# Patient Record
Sex: Male | Born: 1945
Health system: Southern US, Community
[De-identification: ages and names within clinical notes are randomized; demographics above are authoritative.]

## PROBLEM LIST (undated history)

## (undated) DIAGNOSIS — K219 Gastro-esophageal reflux disease without esophagitis: Secondary | ICD-10-CM

## (undated) DIAGNOSIS — I10 Essential (primary) hypertension: Secondary | ICD-10-CM

## (undated) DIAGNOSIS — I2510 Atherosclerotic heart disease of native coronary artery without angina pectoris: Secondary | ICD-10-CM

## (undated) DIAGNOSIS — N028 Recurrent and persistent hematuria with other morphologic changes: Secondary | ICD-10-CM

## (undated) DIAGNOSIS — I219 Acute myocardial infarction, unspecified: Secondary | ICD-10-CM

## (undated) DIAGNOSIS — Z9289 Personal history of other medical treatment: Secondary | ICD-10-CM

## (undated) DIAGNOSIS — R7303 Prediabetes: Secondary | ICD-10-CM

## (undated) DIAGNOSIS — I509 Heart failure, unspecified: Secondary | ICD-10-CM

## (undated) DIAGNOSIS — N02B9 Other recurrent and persistent immunoglobulin A nephropathy: Secondary | ICD-10-CM

## (undated) DIAGNOSIS — Z5189 Encounter for other specified aftercare: Secondary | ICD-10-CM

## (undated) DIAGNOSIS — K5792 Diverticulitis of intestine, part unspecified, without perforation or abscess without bleeding: Secondary | ICD-10-CM

## (undated) DIAGNOSIS — M1990 Unspecified osteoarthritis, unspecified site: Secondary | ICD-10-CM

## (undated) DIAGNOSIS — R011 Cardiac murmur, unspecified: Secondary | ICD-10-CM

## (undated) DIAGNOSIS — Z8249 Family history of ischemic heart disease and other diseases of the circulatory system: Secondary | ICD-10-CM

## (undated) DIAGNOSIS — D649 Anemia, unspecified: Secondary | ICD-10-CM

## (undated) DIAGNOSIS — I499 Cardiac arrhythmia, unspecified: Secondary | ICD-10-CM

## (undated) DIAGNOSIS — H269 Unspecified cataract: Secondary | ICD-10-CM

## (undated) DIAGNOSIS — E785 Hyperlipidemia, unspecified: Secondary | ICD-10-CM

## (undated) DIAGNOSIS — Z87442 Personal history of urinary calculi: Secondary | ICD-10-CM

## (undated) DIAGNOSIS — IMO0002 Reserved for concepts with insufficient information to code with codable children: Secondary | ICD-10-CM

## (undated) HISTORY — DX: Encounter for other specified aftercare: Z51.89

## (undated) HISTORY — DX: Unspecified cataract: H26.9

## (undated) HISTORY — DX: Personal history of other medical treatment: Z92.89

## (undated) HISTORY — PX: COLONOSCOPY: SHX174

## (undated) HISTORY — PX: LUNG LOBECTOMY: SHX167

## (undated) HISTORY — DX: Family history of ischemic heart disease and other diseases of the circulatory system: Z82.49

## (undated) HISTORY — DX: Other recurrent and persistent immunoglobulin A nephropathy: N02.B9

## (undated) HISTORY — PX: COLON SURGERY: SHX602

## (undated) HISTORY — DX: Essential (primary) hypertension: I10

## (undated) HISTORY — DX: Atherosclerotic heart disease of native coronary artery without angina pectoris: I25.10

## (undated) HISTORY — DX: Recurrent and persistent hematuria with other morphologic changes: N02.8

## (undated) HISTORY — DX: Diverticulitis of intestine, part unspecified, without perforation or abscess without bleeding: K57.92

## (undated) HISTORY — DX: Hyperlipidemia, unspecified: E78.5

---

## 1995-08-22 HISTORY — PX: CARDIAC CATHETERIZATION: SHX172

## 1999-02-28 ENCOUNTER — Emergency Department (HOSPITAL_COMMUNITY): Admission: EM | Admit: 1999-02-28 | Discharge: 1999-02-28 | Payer: Self-pay | Admitting: Emergency Medicine

## 1999-02-28 ENCOUNTER — Encounter: Payer: Self-pay | Admitting: Emergency Medicine

## 2000-01-22 HISTORY — PX: CORONARY ARTERY BYPASS GRAFT: SHX141

## 2000-02-03 ENCOUNTER — Encounter: Payer: Self-pay | Admitting: Cardiovascular Disease

## 2000-02-03 ENCOUNTER — Inpatient Hospital Stay (HOSPITAL_COMMUNITY): Admission: EM | Admit: 2000-02-03 | Discharge: 2000-02-10 | Payer: Self-pay | Admitting: Emergency Medicine

## 2000-02-05 ENCOUNTER — Encounter: Payer: Self-pay | Admitting: Cardiothoracic Surgery

## 2000-02-06 ENCOUNTER — Encounter: Payer: Self-pay | Admitting: Cardiothoracic Surgery

## 2000-02-07 ENCOUNTER — Encounter: Payer: Self-pay | Admitting: Cardiothoracic Surgery

## 2000-02-08 ENCOUNTER — Encounter: Payer: Self-pay | Admitting: Cardiothoracic Surgery

## 2000-02-09 ENCOUNTER — Encounter: Payer: Self-pay | Admitting: Cardiothoracic Surgery

## 2001-02-02 ENCOUNTER — Inpatient Hospital Stay (HOSPITAL_COMMUNITY): Admission: AC | Admit: 2001-02-02 | Discharge: 2001-02-04 | Payer: Self-pay

## 2001-02-02 ENCOUNTER — Encounter: Payer: Self-pay | Admitting: Emergency Medicine

## 2001-02-03 ENCOUNTER — Encounter: Payer: Self-pay | Admitting: General Surgery

## 2003-09-14 ENCOUNTER — Emergency Department (HOSPITAL_COMMUNITY): Admission: EM | Admit: 2003-09-14 | Discharge: 2003-09-14 | Payer: Self-pay | Admitting: Emergency Medicine

## 2004-06-10 ENCOUNTER — Emergency Department (HOSPITAL_COMMUNITY): Admission: EM | Admit: 2004-06-10 | Discharge: 2004-06-11 | Payer: Self-pay | Admitting: Emergency Medicine

## 2006-05-02 ENCOUNTER — Emergency Department (HOSPITAL_COMMUNITY): Admission: EM | Admit: 2006-05-02 | Discharge: 2006-05-02 | Payer: Self-pay | Admitting: Emergency Medicine

## 2006-05-02 IMAGING — CT CT ABDOMEN W/O CM
2 of 3 series · 17 of 46 positions shown, 19 images · IV contrast (agent unspecified)
Comparison: none

CLINICAL DATA: Left flank pain.  History of kidney stones.  
ABDOMEN CT WITHOUT CONTRAST:
TECHNIQUE: Multidetector CT imaging of the abdomen was performed following the standard protocol without IV contrast.
TECHNIQUE: Multidetector CT imaging of the pelvis was performed following the standard protocol without IV contrast.

[Series 3: stone proto 5.0 b31f · axial · 0.77mm/px · z∈[-440,-35]mm · 14 of 94 slices shown, 16 images]
[im 7/94  soft-tissue]
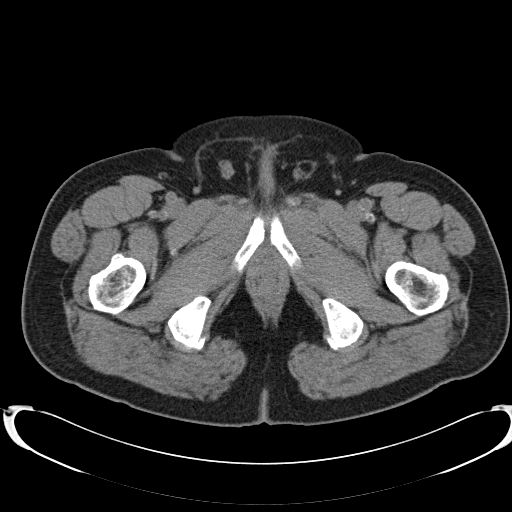
[im 7/94  bone]
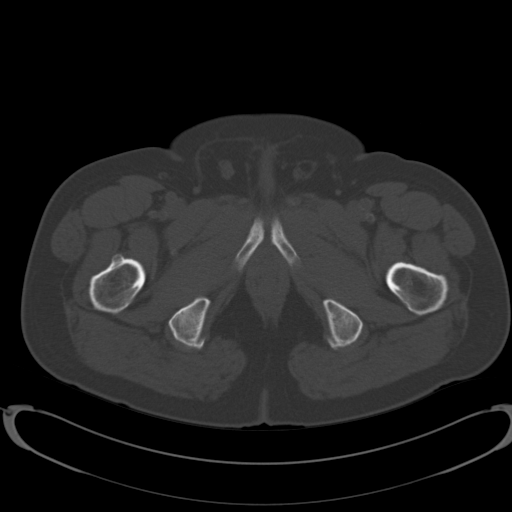
[im 13/94  soft-tissue]
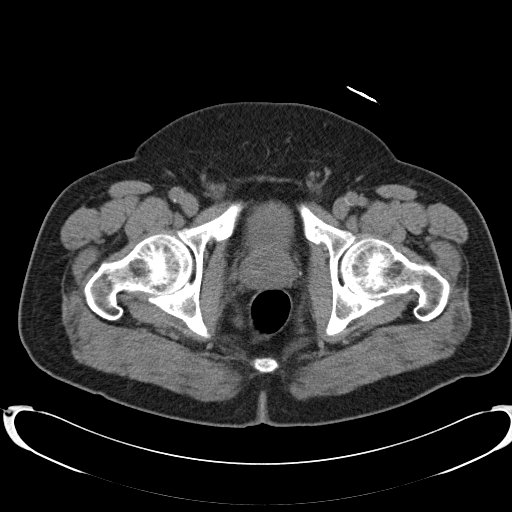
[im 19/94  soft-tissue]
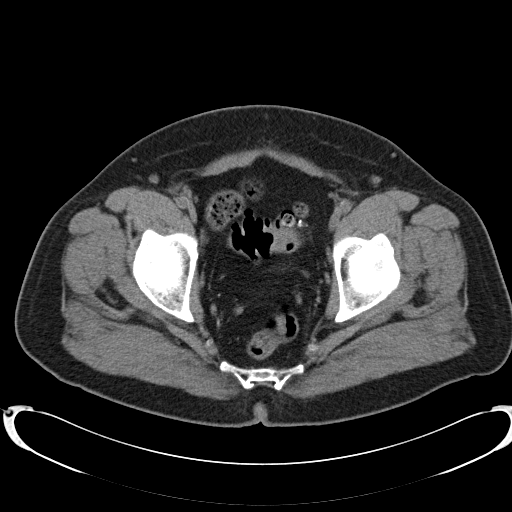
[im 25/94  soft-tissue]
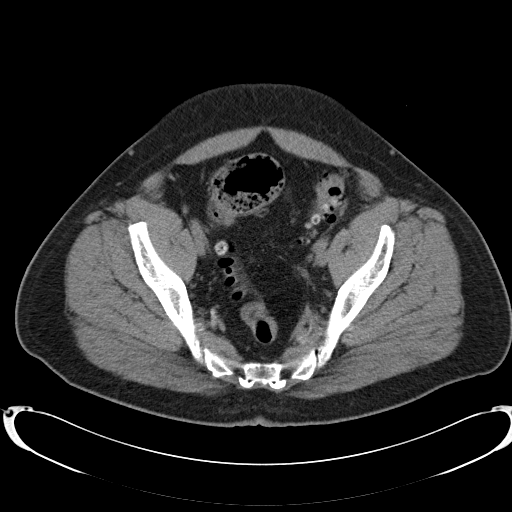
[im 31/94  soft-tissue]
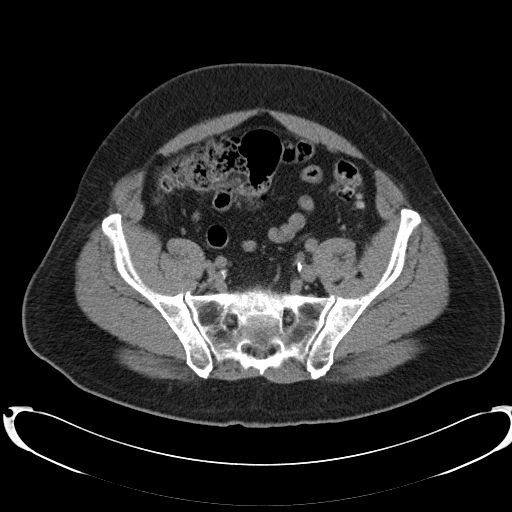
[im 37/94  soft-tissue]
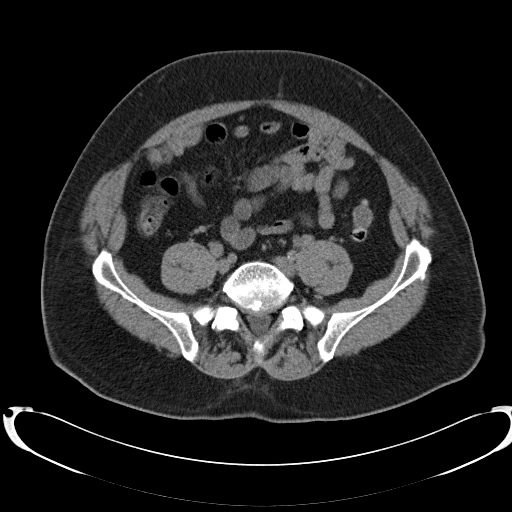
[im 43/94  soft-tissue]
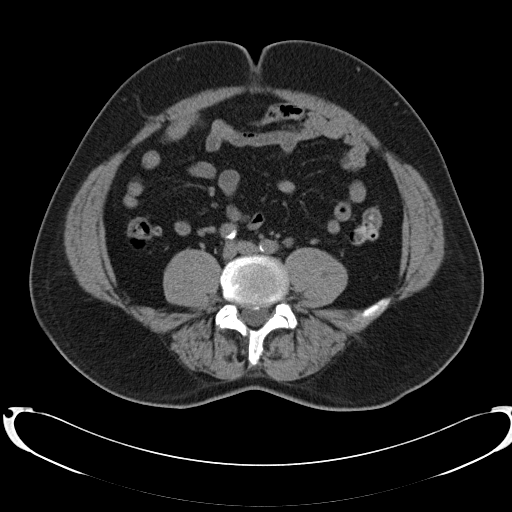
[im 52/94  soft-tissue]
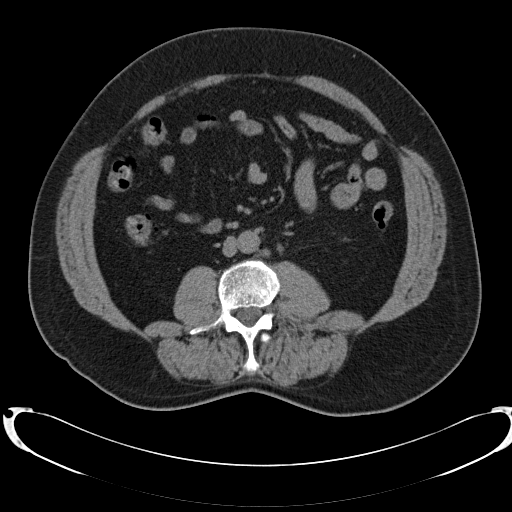
[im 58/94  soft-tissue]
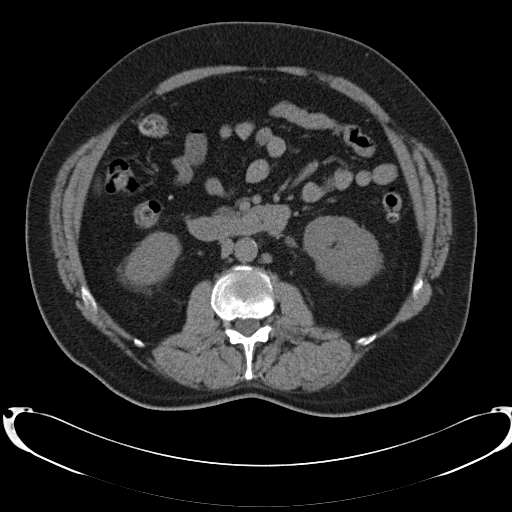
[im 58/94  bone]
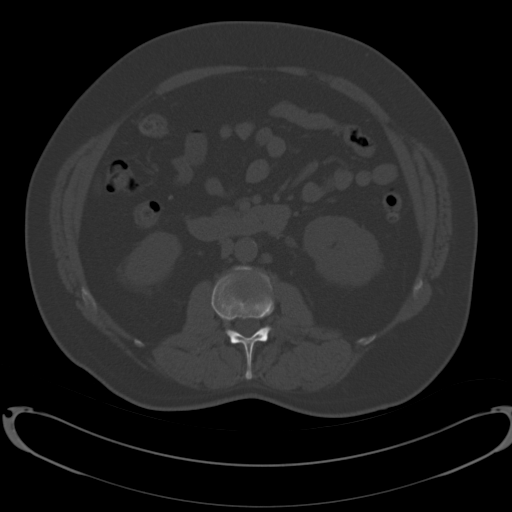
[im 64/94  soft-tissue]
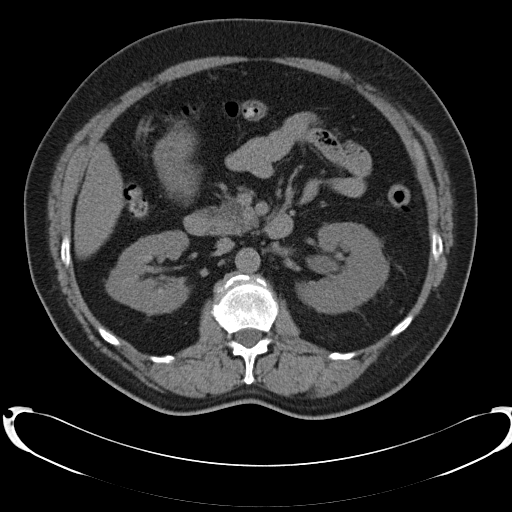
[im 70/94  soft-tissue]
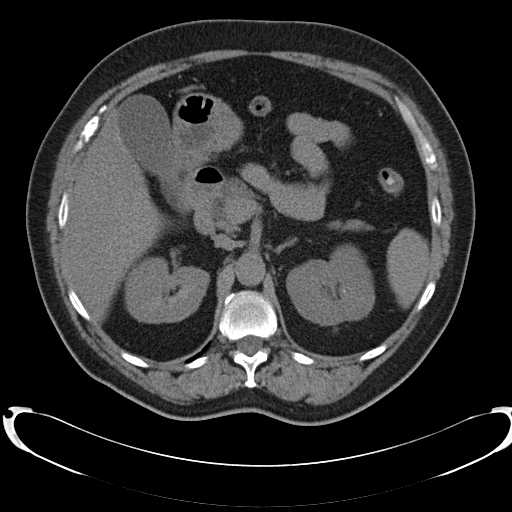
[im 76/94  soft-tissue]
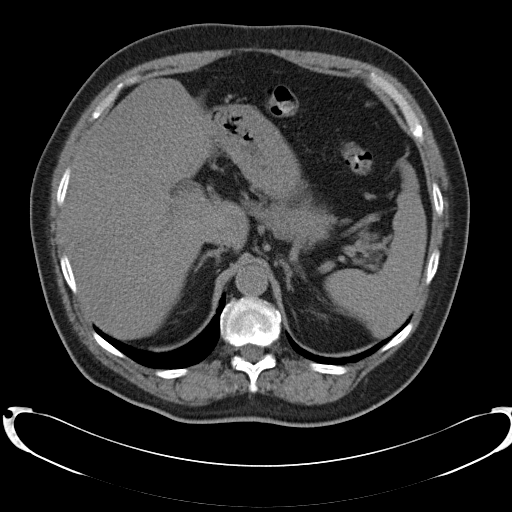
[im 82/94  soft-tissue]
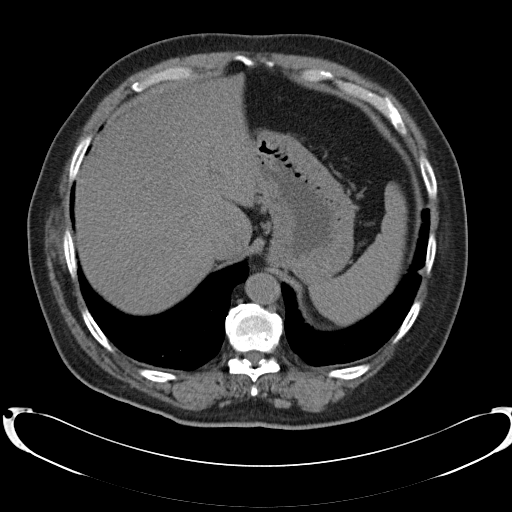
[im 88/94  soft-tissue]
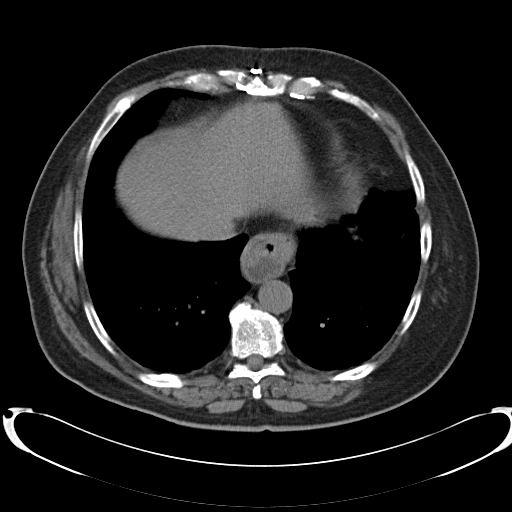

[Series 603: cor · coronal · 0.92mm/px · 3 of 141 slices shown]
[im 47/141  soft-tissue]
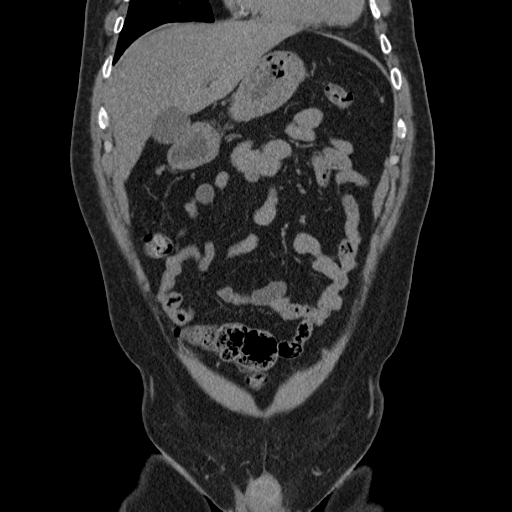
[im 63/141  soft-tissue]
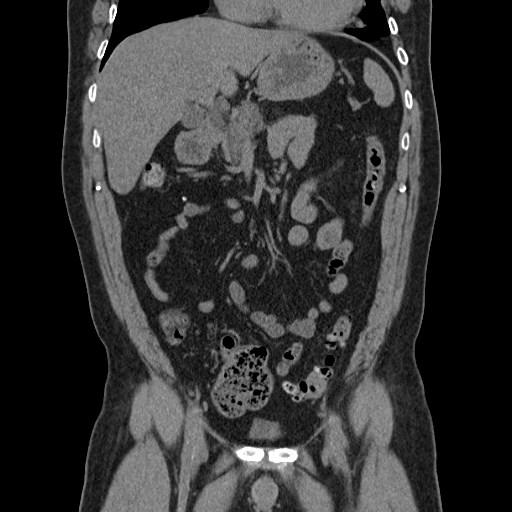
[im 78/141  soft-tissue]
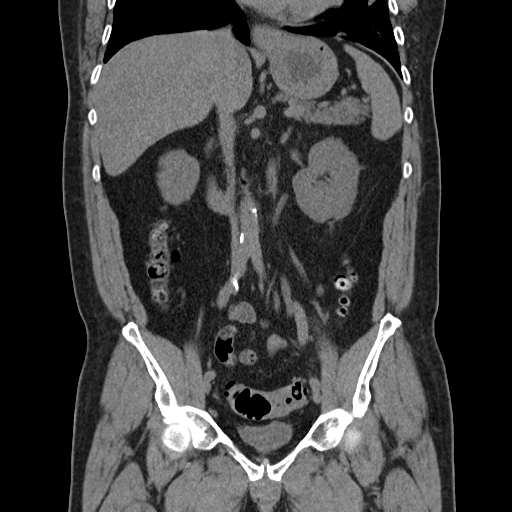

[17 of 46 positions shown; findings below may reference images not displayed]

FINDINGS: 2-3 mm calculus left upper pole collecting system.  There are 2 minute calcifications in the right kidney.  Mild dilatation of the left renal collecting system and ureter.  Minimal perirenal soft tissue stranding.  Hiatal hernia.
IMPRESSION: 1.  Findings compatible with acute obstructive uropathy on the left.  CT pelvis to follow.
2.  Bilateral renal calculi.
PELVIS CT WITHOUT CONTRAST:
FINDINGS: Obstructing calculus in the distal left ureter (see images 72 and 73).  Calculus measures approximately 4.5 x 6.0 mm.  It is located at the level of the innominate bones.  Incidentally there are extensive diverticula involving the left descending and sigmoid colon.
IMPRESSION: Distal left ureteral stone.

## 2010-03-04 ENCOUNTER — Encounter: Admission: RE | Admit: 2010-03-04 | Discharge: 2010-03-04 | Payer: Self-pay | Admitting: Cardiovascular Disease

## 2010-03-04 IMAGING — CT CT CERVICAL SPINE W/O CM
3 of 4 series · 16 of 28 positions shown, 18 images · non-contrast
Comparison: Report from [DATE]

CLINICAL DATA: Left neck and shoulder pain.  Left arm numbness and
weakness.

CT CERVICAL SPINE WITHOUT CONTRAST
TECHNIQUE: Multidetector CT imaging of the cervical spine was
performed. Multiplanar CT image reconstructions were also
generated.

[Series 2: c spine bone · axial · 0.27mm/px · z∈[+77,+217]mm · 5 of 85 slices shown, 7 images]
[im 15/85  soft-tissue]
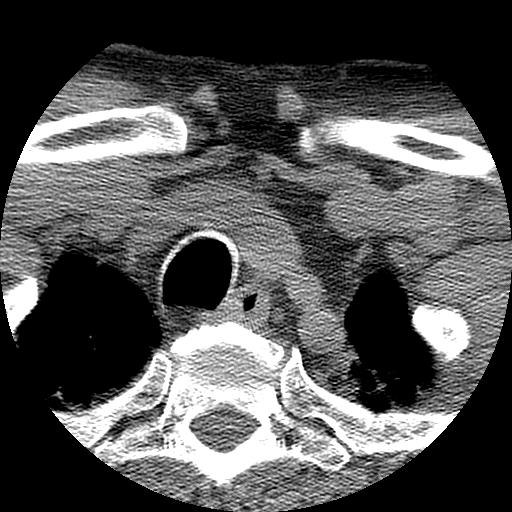
[im 15/85  bone]
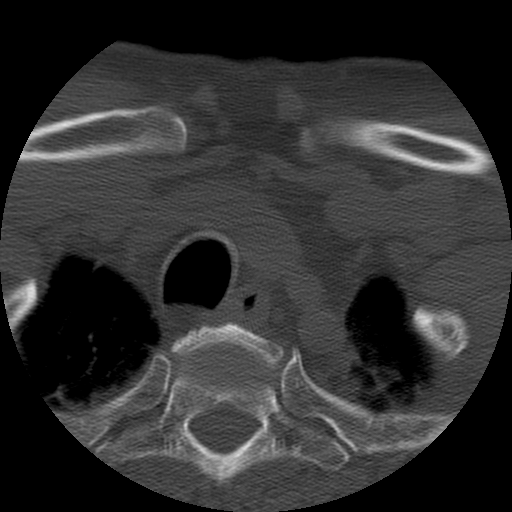
[im 29/85  bone]
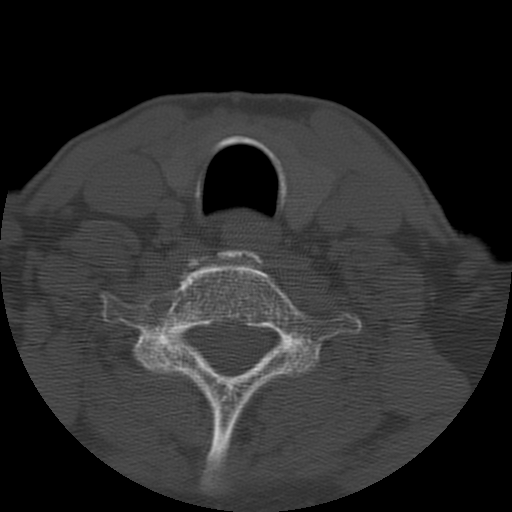
[im 43/85  bone]
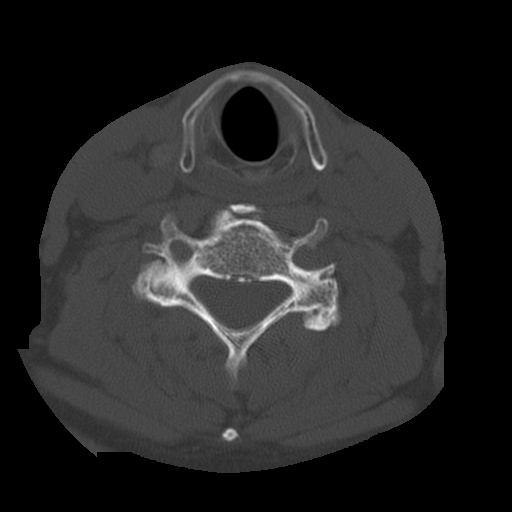
[im 57/85  bone]
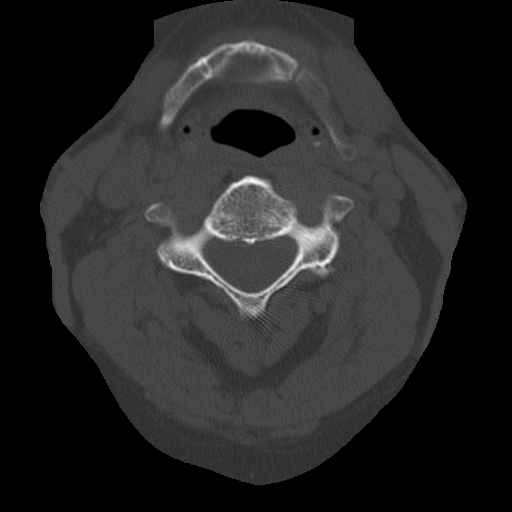
[im 71/85  soft-tissue]
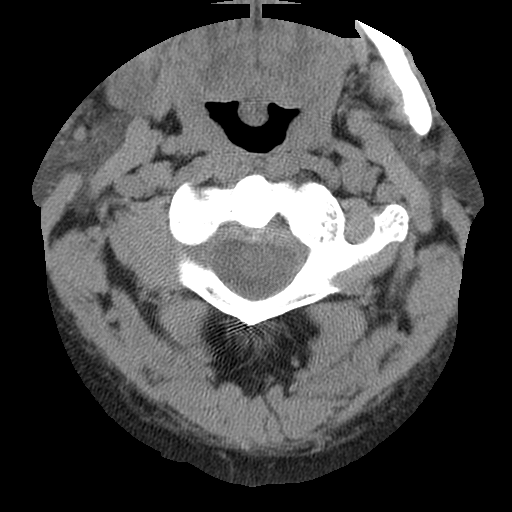
[im 71/85  bone]
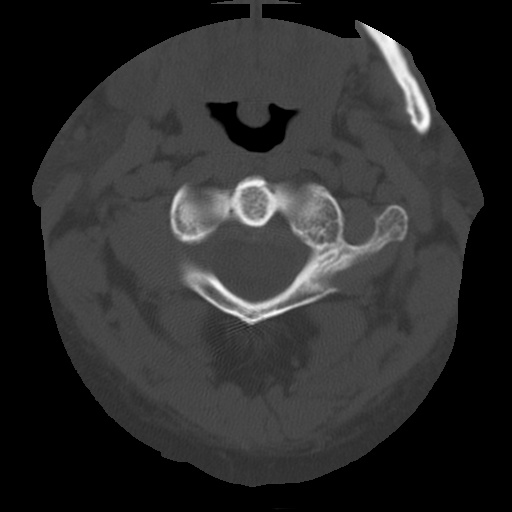

[Series 3: c spine soft · axial · 0.27mm/px · z∈[+77,+217]mm · 5 of 85 slices shown]
[im 15/85  soft-tissue]
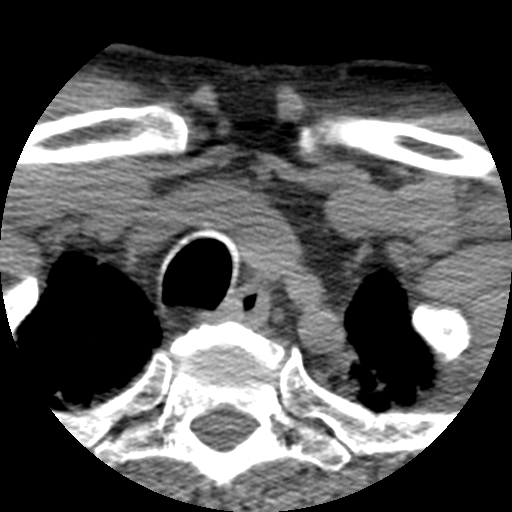
[im 29/85  soft-tissue]
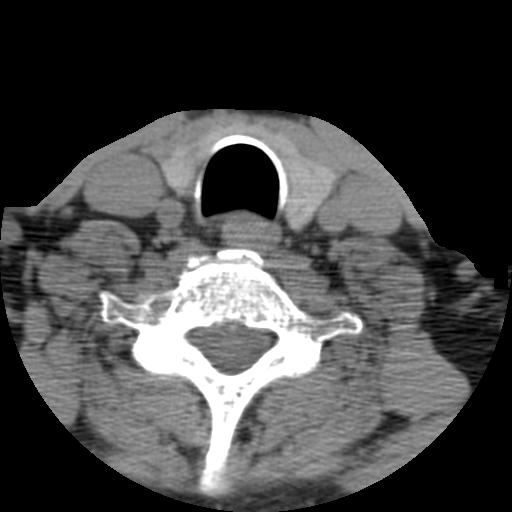
[im 43/85  soft-tissue]
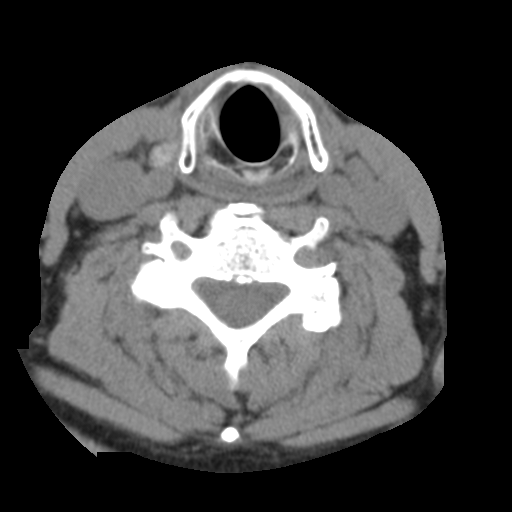
[im 57/85  soft-tissue]
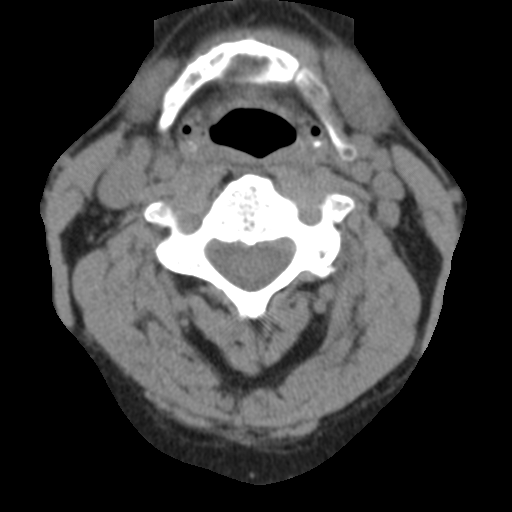
[im 71/85  soft-tissue]
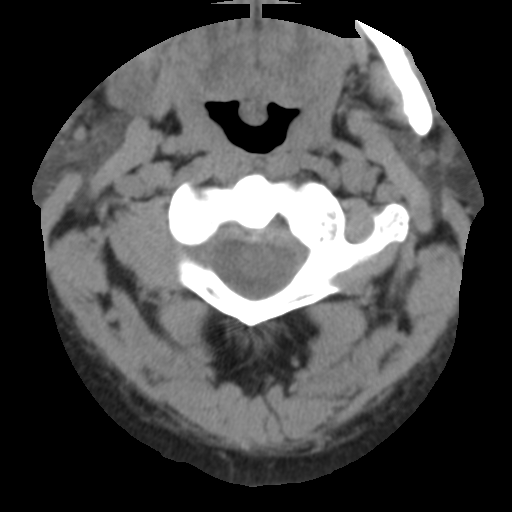

[Series 400: coronal · coronal · 0.42mm/px · 6 of 37 slices shown]
[im 2/37  soft-tissue]
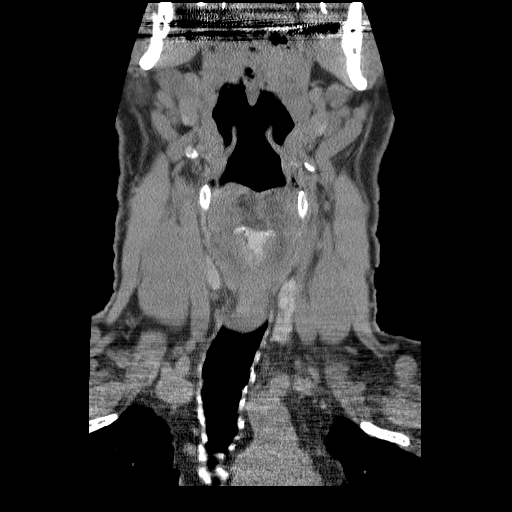
[im 7/37  bone]
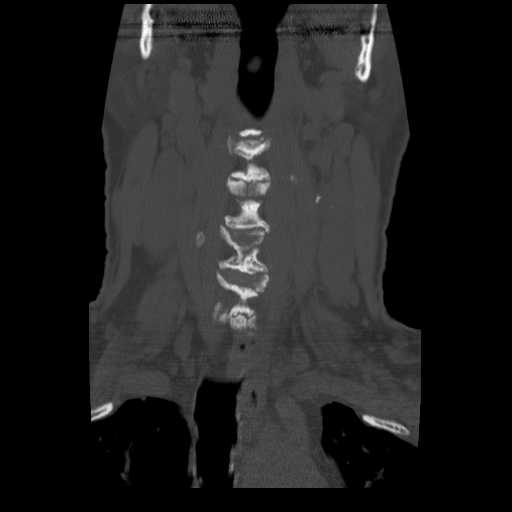
[im 13/37  bone]
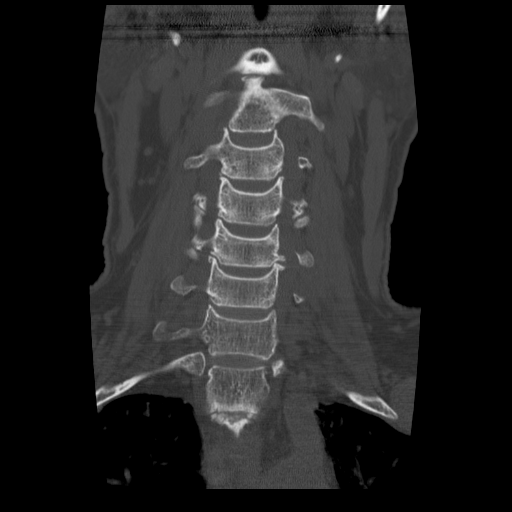
[im 19/37  bone]
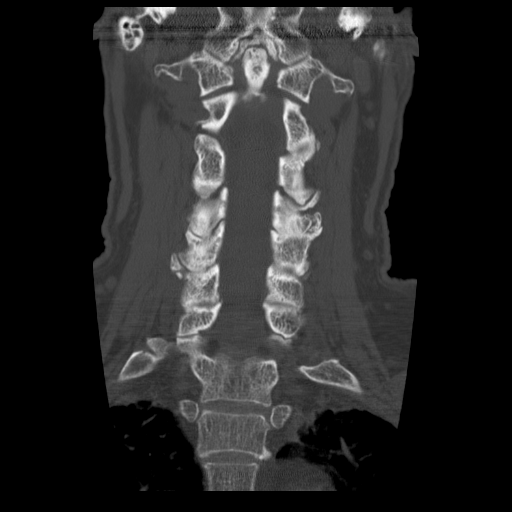
[im 25/37  bone]
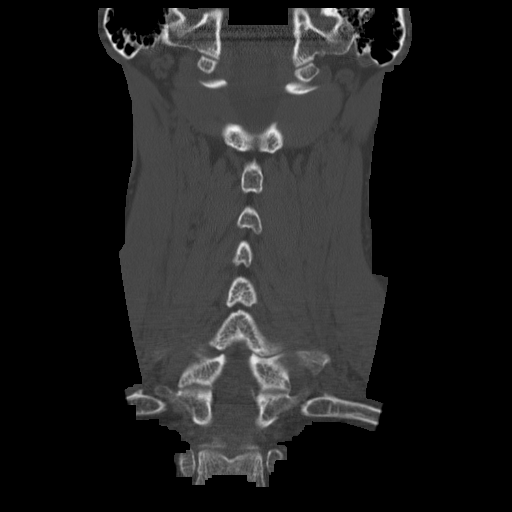
[im 31/37  bone]
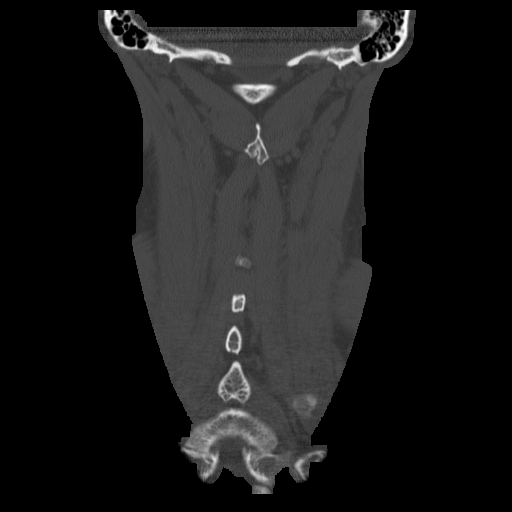

[16 of 28 positions shown; findings below may reference images not displayed]

FINDINGS: Degenerative spurring noted at the C1-2 articulation,
with prominent spurring from the anterior arch of C1 extending
between the tip of the dens and the basion.  There is an ossicle to
the left of the tip of the dens.

Mild facet arthropathy is noted on the right at C4-5, C5-6, and C6-
7.  Prominent facet arthropathy is present on the left at C4-5 with
mild to moderate facet arthropathy on the left at C2-3 and C3-4.

Additional findings at individual levels are as follows:

C2-3:  Mild bilateral uncinate spurring noted.

C3-4:  Left greater than right uncinate spurring noted with
moderate left and borderline right osseous foraminal narrowing.

C4-5:  Minimal uncinate and facet spurring noted with borderline
right foraminal narrowing.

C5-6:  Diffuse left eccentric disc bulge noted with uncinate
spurring.  There is suspected moderate there is prominent bilateral
foraminal stenosis.  There is mild to moderate central stenosis.

C6-7:  Disc osteophyte complex, facet arthropathy, and uncinate
spurring noted with a prominent right and moderate to prominent
left foraminal stenosis.

C7-T1:  No osseous foraminal narrowing noted.
IMPRESSION: 1.  Cervical spondylosis and degenerative disc disease, with
multilevel stenosis.

## 2010-06-13 ENCOUNTER — Encounter: Payer: Self-pay | Admitting: Cardiovascular Disease

## 2010-10-08 NOTE — Discharge Summary (Signed)
[place]. [facility]  Patient:    Jared White, Jared White Visit Number: [id number] MRN: [id number]          Service Type: MED Location: [id number] Attending Physician:  Trauma, Md Dictated by:   Eugenia Pancoast, P.A. Admit Date:  02/02/2001 Discharge Date: 02/04/2001                             Discharge Summary  DATE OF BIRTH:  October 30, 1945  FINAL DIAGNOSES: 1. Right scapular fracture. 2. Multiple right rib fractures. 3. Contusion of scalp. 4. Contusion of chest wall.  HISTORY OF PRESENT ILLNESS:  This is a 65 year old gentleman who fell approximately one story.  He was brought to the emergency room.  Workup showed multiple right rib fractures and also chest wall contusion.  He also had a scapular fracture and he had a questionable brachial plexus injury.  He was complaining of numbness in his hands.  The patient was subsequently admitted.  HOSPITAL COURSE:  Overnight he did satisfactorily.  The numbness was improving and within 24 hours.  His diet was advanced as tolerated.  The next day, September 15, he was doing quite well and was up and ambulating without difficulty.  The numbness was completely resolved.  Subsequently at this time he was discharged home.  The patient was given Percocet one or two p.o. q.4-6h. p.r.n. for pain.  The patient was discharged home in satisfactory stable condition on February 04, 2001. Dictated by:   Eugenia Pancoast, P.A. Attending Physician:  Trauma, Md DD:  02/22/01 TD:  02/22/01 Job: [id number]

## 2010-10-08 NOTE — Op Note (Signed)
[place]. [facility]  Patient:    Jared White, Jared White                         MRN: [id number] Proc. Date: 02/05/00 Adm. Date:  [id number] Attending:  Ruta Hinds CC:         CVTS Office  Richard A. Alanda Amass, M.D., [facility]   Operative Report  PROCEDURE:  Coronary artery bypass grafting x 5 (left internal mammary artery to left anterior descending, saphenous vein graft to diagonal, sequential saphenous vein graft to ramus intermediate and obtuse marginal, saphenous vein graft to posterior descending).  PREOPERATIVE DIAGNOSIS:  Severe left main and 3-vessel coronary disease with unstable angina.  POSTOPERATIVE DIAGNOSIS:  Severe left main and 3-vessel coronary disease with unstable angina.  SURGEON:  Mikey Bussing, M.D.  ASSISTANT:  Areta Haber, P.A.-C.  ANESTHESIA:  General by Dr. Diamantina Monks.  INDICATIONS:  The patient is a 65 year old male with a history of coronary disease, who presented with preinfarction angina.  Urgent cardiac catheterization demonstrated severe left main and 3-vessel coronary disease with fairly well-preserved left ventricular function.  He was referred for coronary revascularization due to his bad coronary anatomy and symptoms of unstable angina.  Prior to the operation, I examined the patient in the catheterization laboratory holding area and reviewed the results of the cardiac catheterization with the patient.  I discussed the indications and expected benefits of coronary artery bypass grafting.  I discussed the major aspects of the operation, including the location of the surgical incisions, the choice of conduit, the use of cardiopulmonary bypass and general anesthesia, and the expected hospital recovery period.  I reviewed the associated risks of the operation, including the risk of MI, CVA, bleeding, infection, and death.  He understood there were alternatives to surgical  therapy for his coronary artery disease.  He agreed to proceed with the operation as planned following our discussion under informed consent.  OPERATIVE FINDINGS:  The saphenous vein was taken from the right leg and was of average to above average quality, being slightly small.  The mammary artery was a good conduit with excellent flow.  The circumflex vessels were small, but adequate targets.  PROCEDURE:  The patient was brought to the operating room and placed supine on the operating room table, where general anesthesia was induced under invasive hemodynamic monitoring.  The chest, abdomen and legs were prepped with Betadine and draped as a sterile field.  A median sternotomy was performed as the saphenous vein was harvested from the right leg.  Internal mammary artery was harvested as a pedicle graft from its origin at the subclavian vessels and was a good vessel with adequate flow.  Heparin was administered and the ACT was documented as being therapeutic.  The patient was then cannulated on bypass and cooled to 32 degrees.  The distal coronaries were identified and the mammary artery and vein grafts were prepared for the distal anastomoses. A cardioplegia cannula was placed and the patient was cooled to 28 degrees. The aortic cross-clamp was applied and 500 cc of cold-blood cardioplegia was delivered via the aortic root with immediate cardioplegic arrest and septal temperature dropping less than 12 degrees. Topical ice saline slush was used to augment myocardial preservation and a pericardial insulator pad was used to protect the left phrenic nerve.  The distal coronary anastomoses were then performed.  The first distal anastomosis was to the posterior descending.  This is a 1.5 mm vessel with proximal 90% stenosis.  A reverse saphenous vein was sewn end-to-side with a running 7-0 Prolene.  The second distal anastomosis was to the first diagonal, which was a 1.5 mm vessel with a  proximal 90% stenosis.  A reverse saphenous vein was sewn end-to-side with running 7-0 Prolene.  The third and fourth distal anastomoses consisted of a sequential vein graft to the ramus intermediate and the OM-1.  These were both 1.5 mm vessels with proximal 90% stenoses.  The ramus anastomosis was performed as a side-to-side running 7-0 Prolene.  The sequential graft continued to the OM-1, which was a 1.5 mm vessel and an end-to-side anastomosis was performed using running 7-0 Prolene. There was good flow through the graft and cardioplegia was redosed through. The fifth distal anastomosis was to the mid aspect of the LAD, which was a 1.8 mm vessel with proximal 95% stenosis.  The left internal mammary artery pedicle was brought through an opening created in the left lateral pericardium.  It was brought down on the LAD, and sewn end-to-side with a running 8-0 Prolene.  There was excellent flow through the anastomosis with immediate rise in septal temperature after release of the pedicle clamp on the mammary artery.  The mammary pedicle was secured to the epicardium and the aortic cross-clamp was removed.  The heart was cardioverted back to a regular rhythm and three proximal vein anastomoses were placed using a 4.0 mm punch and the partial occluding clamp. The partial clamp was removed and the vein grafts were perfused.  All grafts had excellent flow and hemostasis was documented at the proximal and distal sites.  The patient was rewarmed to 37 degrees and temporary pacing wires were applied.  The lungs were then reexpanded and the ventilator was resumed.  The patient was weaned from cardiopulmonary bypass without inotropes with stable blood pressure and cardiac output.  Protamine was administered and the cannulae were removed.  The mediastinum was irrigated with warm antibiotic irrigation and the leg incision was irrigated and closed in a standard fashion.  Due to persistent coagulopathy  postbypass, a unit of platelets were  administered to help counter the effect of the preoperative Integrilin infusion for 48 hours.  This improved thrombostasis.  The superior pericardial fat was closed over the aorta and vein grafts.   Two mediastinal and a left pleural chest tube were placed and brought out through separate incisions. The sternum was reapproximated with interrupted steel wire.  The pectoralis fascia and subcutaneous layers were closed with running Vicryl.  The skin was closed with a subcuticular and sterile dressings were applied.  Total cardiopulmonary bypass time was 130 minutes with an aortic cross-clamp time of 60 minutes. DD:  02/05/00 TD:  02/07/00 Job: [id number]
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
## 2011-06-30 DIAGNOSIS — Z9289 Personal history of other medical treatment: Secondary | ICD-10-CM

## 2011-06-30 HISTORY — DX: Personal history of other medical treatment: Z92.89

## 2012-05-08 ENCOUNTER — Other Ambulatory Visit (HOSPITAL_COMMUNITY): Payer: Self-pay | Admitting: Cardiovascular Disease

## 2012-05-08 DIAGNOSIS — I6529 Occlusion and stenosis of unspecified carotid artery: Secondary | ICD-10-CM

## 2012-10-12 ENCOUNTER — Telehealth: Payer: Self-pay | Admitting: Cardiovascular Disease

## 2012-10-12 MED ORDER — ATENOLOL 50 MG PO TABS: 50.0000 mg | ORAL_TABLET | Freq: Every day | ORAL | Status: DC

## 2012-10-12 MED ORDER — VALSARTAN 160 MG PO TABS: 160.0000 mg | ORAL_TABLET | Freq: Every day | ORAL | Status: DC

## 2012-10-12 NOTE — Telephone Encounter (Signed)
Pt is at pharmacy-pt states he have been waiting for his Diovan and Atenolol#90-Send 2 fax and one just a few minutes ago!Been waiting since Wednesday!

## 2012-10-12 NOTE — Telephone Encounter (Signed)
Refills sent to pharmacy. 

## 2012-11-13 ENCOUNTER — Encounter: Payer: Self-pay | Admitting: Cardiovascular Disease

## 2012-11-14 ENCOUNTER — Other Ambulatory Visit: Payer: Self-pay | Admitting: Cardiovascular Disease

## 2012-11-14 ENCOUNTER — Ambulatory Visit (HOSPITAL_COMMUNITY)
Admission: RE | Admit: 2012-11-14 | Discharge: 2012-11-14 | Disposition: A | Discharge status: Home / Self Care | Payer: Medicare Other | Source: Ambulatory Visit | Attending: Cardiovascular Disease | Admitting: Cardiovascular Disease

## 2012-11-14 DIAGNOSIS — I714 Abdominal aortic aneurysm, without rupture, unspecified: Secondary | ICD-10-CM | POA: Insufficient documentation

## 2012-11-14 NOTE — Progress Notes (Signed)
Abdominal Aorta Duplex Completed. °Jared White ° °

## 2012-12-14 ENCOUNTER — Encounter (HOSPITAL_COMMUNITY): Payer: Self-pay

## 2012-12-14 ENCOUNTER — Encounter (HOSPITAL_COMMUNITY): Payer: Self-pay | Admitting: Emergency Medicine

## 2012-12-14 ENCOUNTER — Emergency Department (HOSPITAL_COMMUNITY): Payer: Medicare Other

## 2012-12-14 ENCOUNTER — Inpatient Hospital Stay (HOSPITAL_COMMUNITY)
Admission: EM | Admit: 2012-12-14 | Discharge: 2012-12-17 | DRG: 184 | Disposition: A | Discharge status: Home / Self Care | Payer: Medicare Other | Attending: General Surgery | Admitting: General Surgery

## 2012-12-14 DIAGNOSIS — W132XXA Fall from, out of or through roof, initial encounter: Secondary | ICD-10-CM

## 2012-12-14 DIAGNOSIS — I10 Essential (primary) hypertension: Secondary | ICD-10-CM | POA: Diagnosis present

## 2012-12-14 DIAGNOSIS — S32009A Unspecified fracture of unspecified lumbar vertebra, initial encounter for closed fracture: Secondary | ICD-10-CM | POA: Diagnosis present

## 2012-12-14 DIAGNOSIS — Z79899 Other long term (current) drug therapy: Secondary | ICD-10-CM

## 2012-12-14 DIAGNOSIS — S22009A Unspecified fracture of unspecified thoracic vertebra, initial encounter for closed fracture: Secondary | ICD-10-CM | POA: Diagnosis present

## 2012-12-14 DIAGNOSIS — S2242XA Multiple fractures of ribs, left side, initial encounter for closed fracture: Secondary | ICD-10-CM

## 2012-12-14 DIAGNOSIS — J9819 Other pulmonary collapse: Secondary | ICD-10-CM | POA: Diagnosis present

## 2012-12-14 DIAGNOSIS — S2249XA Multiple fractures of ribs, unspecified side, initial encounter for closed fracture: Secondary | ICD-10-CM

## 2012-12-14 DIAGNOSIS — IMO0002 Reserved for concepts with insufficient information to code with codable children: Secondary | ICD-10-CM

## 2012-12-14 DIAGNOSIS — K219 Gastro-esophageal reflux disease without esophagitis: Secondary | ICD-10-CM | POA: Diagnosis present

## 2012-12-14 DIAGNOSIS — E785 Hyperlipidemia, unspecified: Secondary | ICD-10-CM | POA: Diagnosis present

## 2012-12-14 DIAGNOSIS — S2241XA Multiple fractures of ribs, right side, initial encounter for closed fracture: Secondary | ICD-10-CM

## 2012-12-14 DIAGNOSIS — I2510 Atherosclerotic heart disease of native coronary artery without angina pectoris: Secondary | ICD-10-CM | POA: Diagnosis present

## 2012-12-14 DIAGNOSIS — W138XXA Fall from, out of or through other building or structure, initial encounter: Secondary | ICD-10-CM | POA: Diagnosis present

## 2012-12-14 HISTORY — DX: Heart failure, unspecified: I50.9

## 2012-12-14 HISTORY — DX: Gastro-esophageal reflux disease without esophagitis: K21.9

## 2012-12-14 HISTORY — DX: Cardiac arrhythmia, unspecified: I49.9

## 2012-12-14 HISTORY — DX: Essential (primary) hypertension: I10

## 2012-12-14 HISTORY — DX: Reserved for concepts with insufficient information to code with codable children: IMO0002

## 2012-12-14 LAB — CBC
HCT: 38.2 % — ABNORMAL LOW (ref 39.0–52.0)
Hemoglobin: 13.6 g/dL (ref 13.0–17.0)
MCH: 31.6 pg (ref 26.0–34.0)
MCHC: 35.6 g/dL (ref 30.0–36.0)
MCV: 88.6 fL (ref 78.0–100.0)
Platelets: 170 10*3/uL (ref 150–400)
RBC: 4.31 MIL/uL (ref 4.22–5.81)
RDW: 13.9 % (ref 11.5–15.5)
WBC: 13.6 10*3/uL — ABNORMAL HIGH (ref 4.0–10.5)

## 2012-12-14 LAB — POCT I-STAT, CHEM 8
BUN: 19 mg/dL (ref 6–23)
Calcium, Ion: 1.12 mmol/L — ABNORMAL LOW (ref 1.13–1.30)
Chloride: 107 mEq/L (ref 96–112)
Creatinine, Ser: 1.3 mg/dL (ref 0.50–1.35)
Glucose, Bld: 177 mg/dL — ABNORMAL HIGH (ref 70–99)
HCT: 40 % (ref 39.0–52.0)
Hemoglobin: 13.6 g/dL (ref 13.0–17.0)
Potassium: 3.6 mEq/L (ref 3.5–5.1)
Sodium: 145 mEq/L (ref 135–145)
TCO2: 24 mmol/L (ref 0–100)

## 2012-12-14 LAB — PROTIME-INR
INR: 1.09 (ref 0.00–1.49)
Prothrombin Time: 13.9 seconds (ref 11.6–15.2)

## 2012-12-14 LAB — TROPONIN I: Troponin I: <0.3 ng/mL (ref <0.30)

## 2012-12-14 LAB — APTT: aPTT: 23 seconds — ABNORMAL LOW (ref 24–37)

## 2012-12-14 IMAGING — CT CT ABD-PELV W/ CM
2 of 6 series · 17 of 46 positions shown, 19 images · IV contrast (CONTRAST)
Comparison: CT scan the abdomen dated [DATE]

CLINICAL DATA: Back pain and right anterior chest pain secondary to
a fall from roof.

CT ABDOMEN AND PELVIS WITH CONTRAST
TECHNIQUE: Multidetector CT imaging of the abdomen and pelvis was
performed following the standard protocol during bolus
administration of intravenous contrast.
Contrast: 100mL OMNIPAQUE IOHEXOL 300 MG/ML  SOLN

[Series 4: cap with · axial · 0.78mm/px · z∈[-477,+93]mm · 14 of 132 slices shown, 16 images]
[im 9/132  soft-tissue]
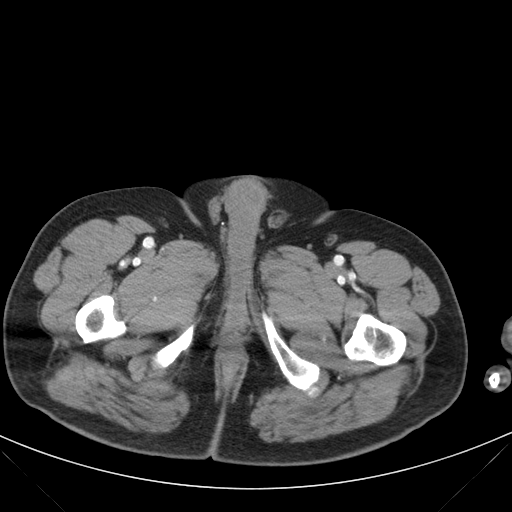
[im 9/132  bone]
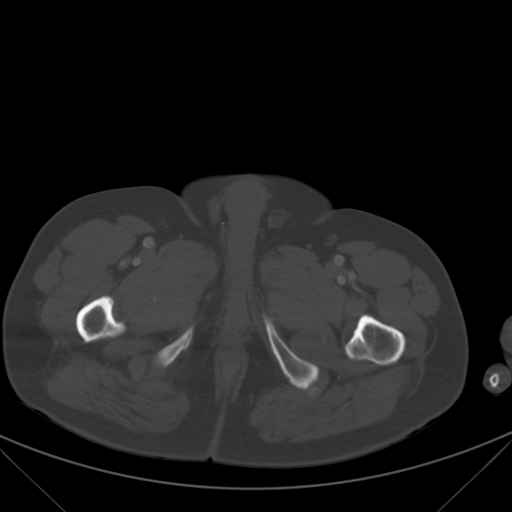
[im 17/132  soft-tissue]
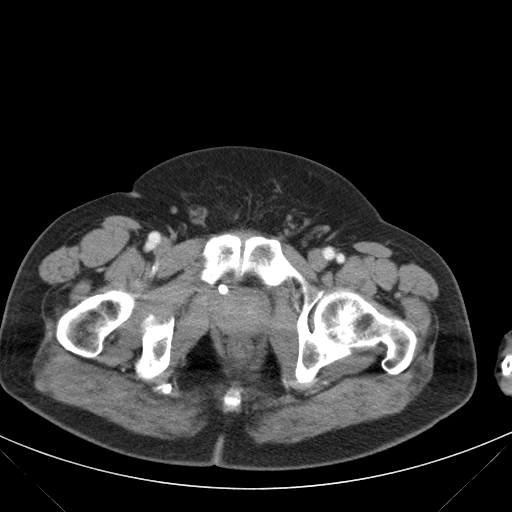
[im 25/132  soft-tissue]
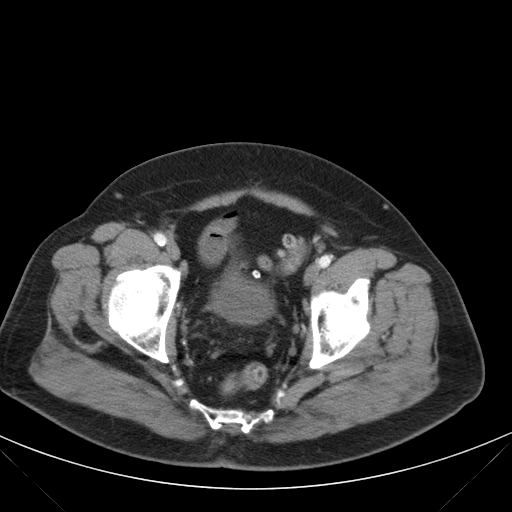
[im 33/132  soft-tissue]
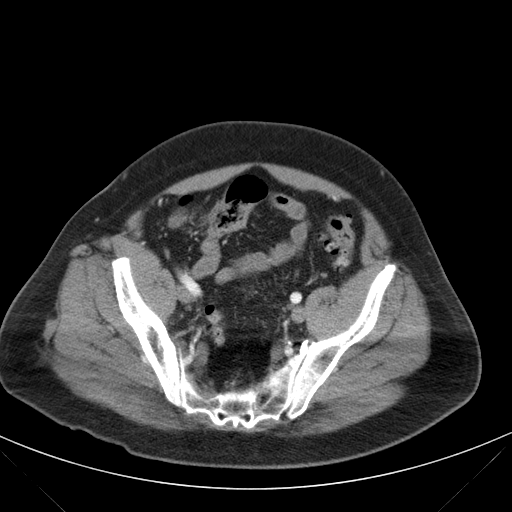
[im 41/132  soft-tissue]
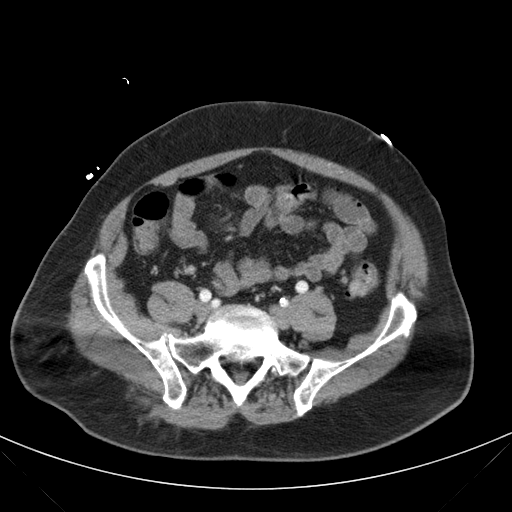
[im 50/132  soft-tissue]
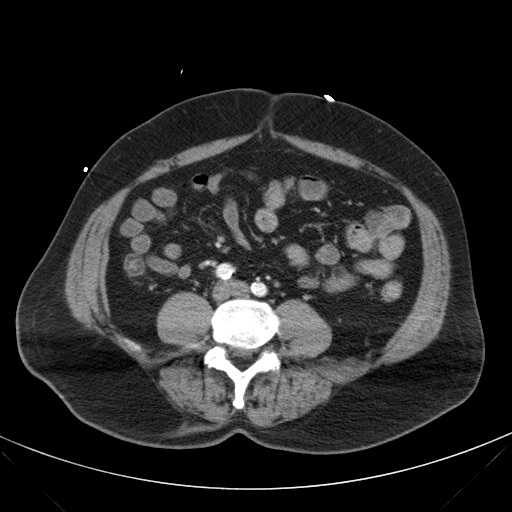
[im 58/132  soft-tissue]
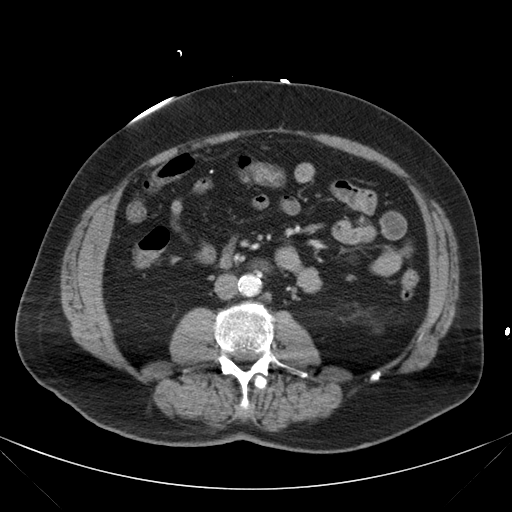
[im 74/132  soft-tissue]
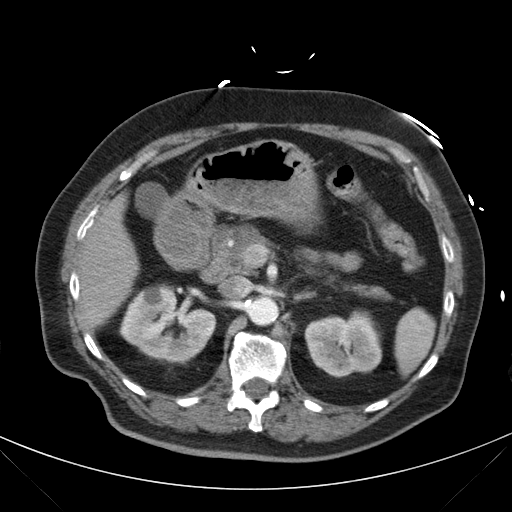
[im 82/132  soft-tissue]
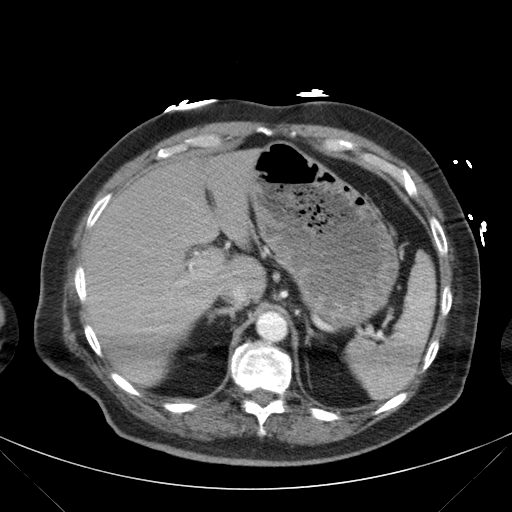
[im 82/132  bone]
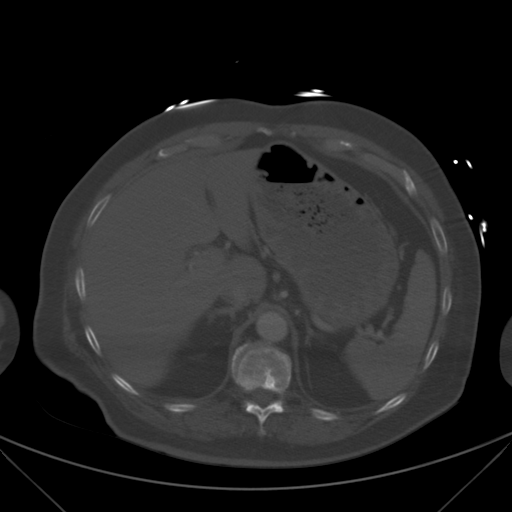
[im 91/132  soft-tissue]
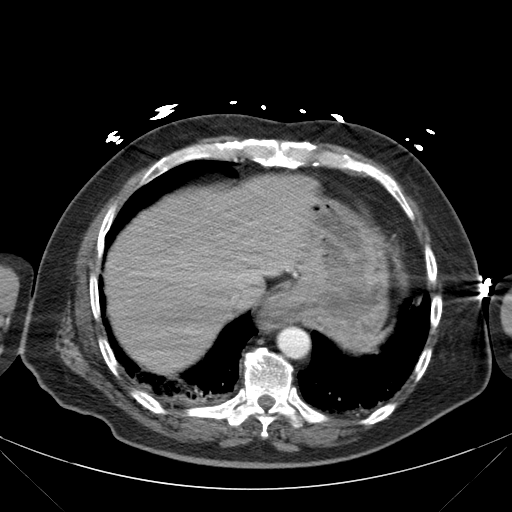
[im 99/132  soft-tissue]
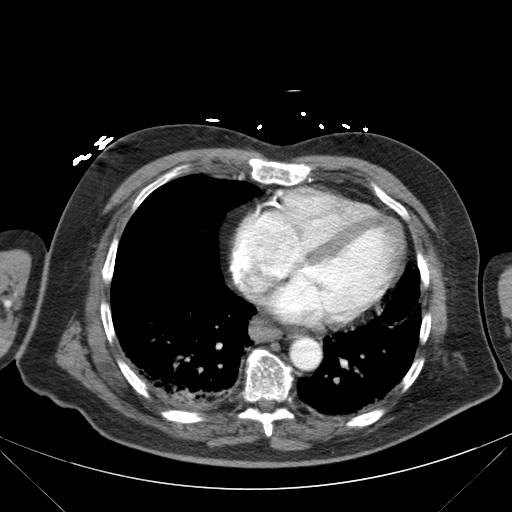
[im 107/132  soft-tissue]
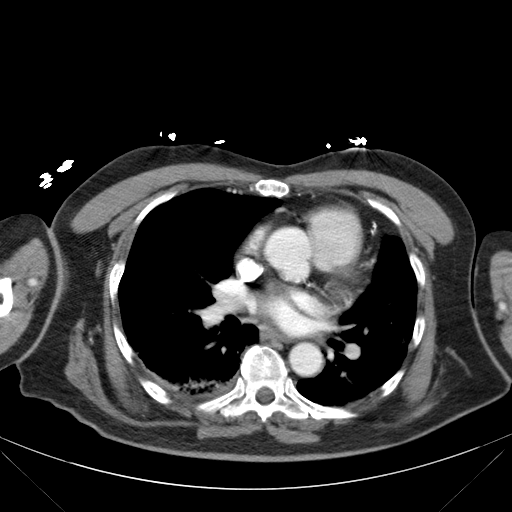
[im 115/132  soft-tissue]
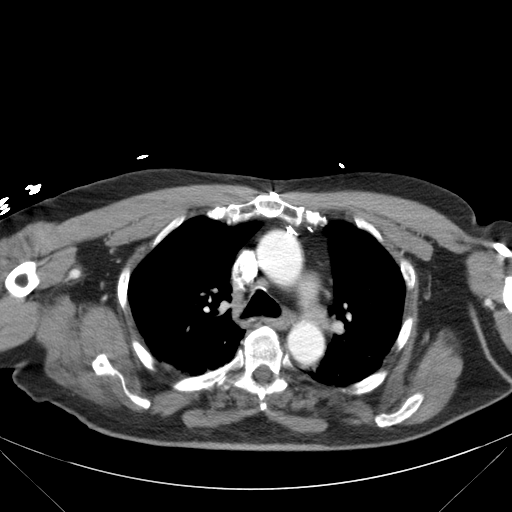
[im 123/132  soft-tissue]
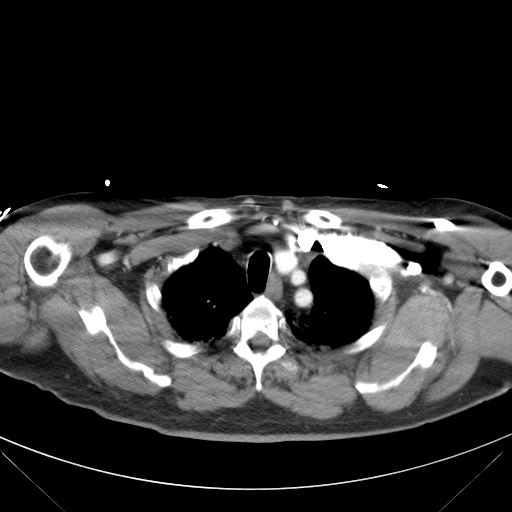

[cor · coronal · 1.28mm/px · 3 of 119 slices shown]
[im 40/119  soft-tissue]
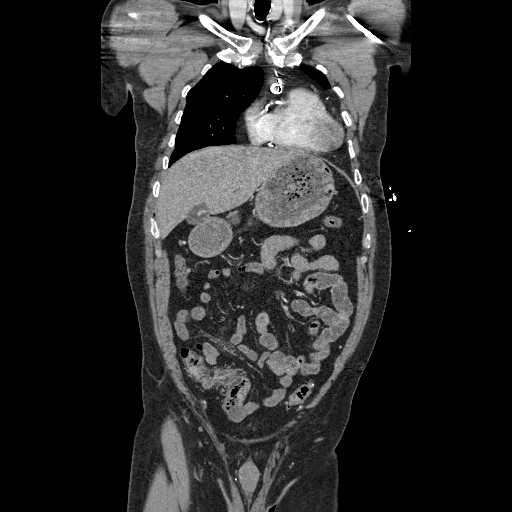
[im 53/119  soft-tissue]
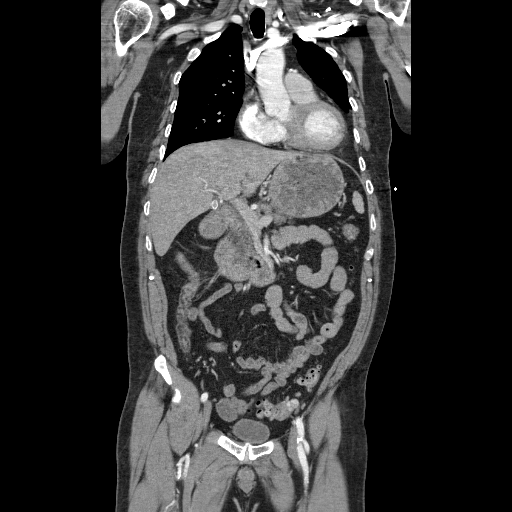
[im 66/119  soft-tissue]
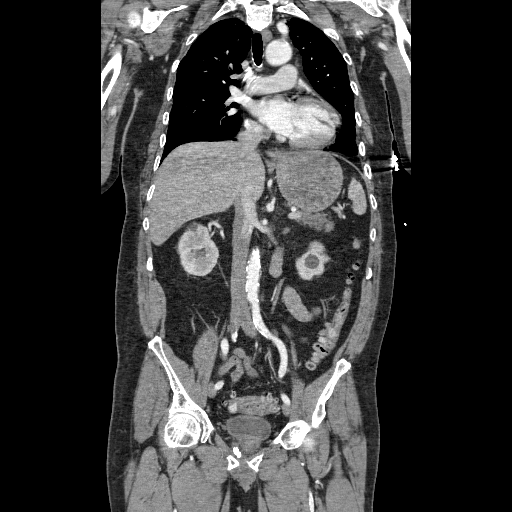

[17 of 46 positions shown; findings below may reference images not displayed]

FINDINGS: There are fractures of the right transverse processes of
T11-L3.

Liver, spleen, pancreas, adrenal glands, and kidneys are normal
except multiple benign-appearing cysts in the kidneys and solitary
small renal calculi bilaterally.

There are multiple stones in the nondistended gallbladder.  No bile
duct dilatation.

Numerous diverticula in the colon, primarily the sigmoid region.

No free air or free fluid in the abdomen.  There is an old
compression deformity of the L2 vertebral body, unchanged.
IMPRESSION: 1.  Multiple right transverse process fractures.  No other acute
abnormalities of the abdomen or pelvis.
2.  Cholelithiasis.
3.  Diverticulosis primarily of the distal colon.
4.  Old compression deformity of L2.

## 2012-12-14 IMAGING — CT CT HEAD W/O CM
3 of 6 series · 15 of 47 positions shown, 18 images · non-contrast
Comparison: Cervical spine CT [DATE].

CT HEAD

CLINICAL DATA: Fall from roof.  Neck and back pain.

CT HEAD WITHOUT CONTRAST
CT CERVICAL SPINE WITHOUT CONTRAST
TECHNIQUE: Multidetector CT imaging of the head and cervical spine
was performed following the standard protocol without intravenous
contrast.  Multiplanar CT image reconstructions of the cervical
spine were also generated.

[Series 5: soft tissue · axial · 0.28mm/px · z∈[+84,+266]mm · 9 of 115 slices shown, 12 images]
[im 12/115  brain]
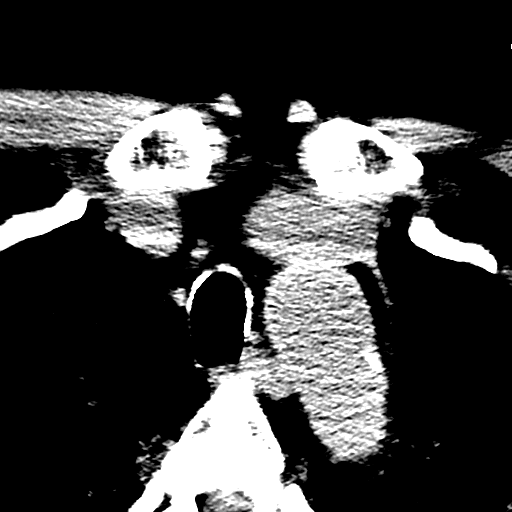
[im 12/115  bone]
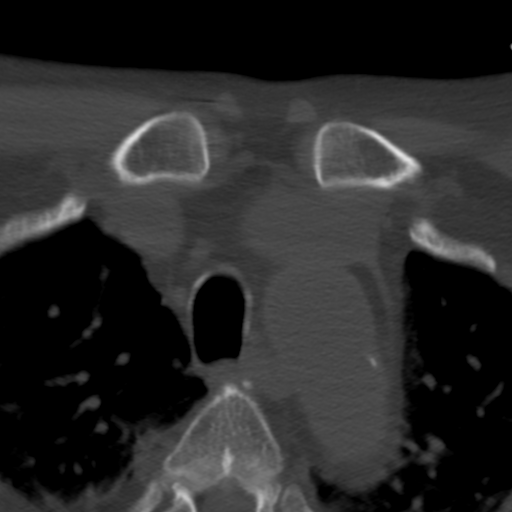
[im 23/115  brain]
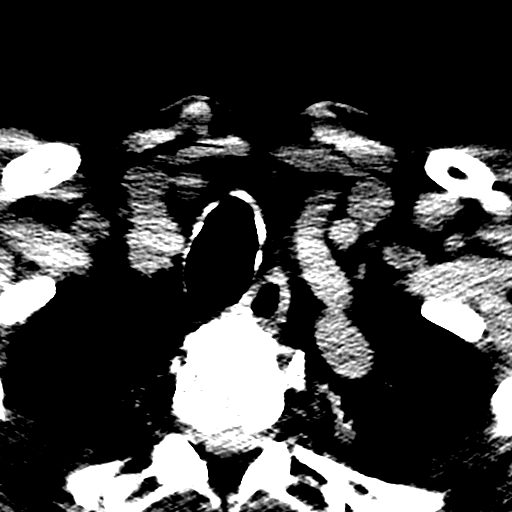
[im 35/115  brain]
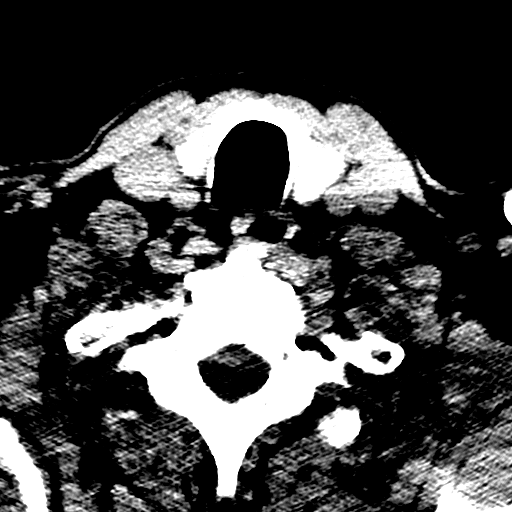
[im 46/115  brain]
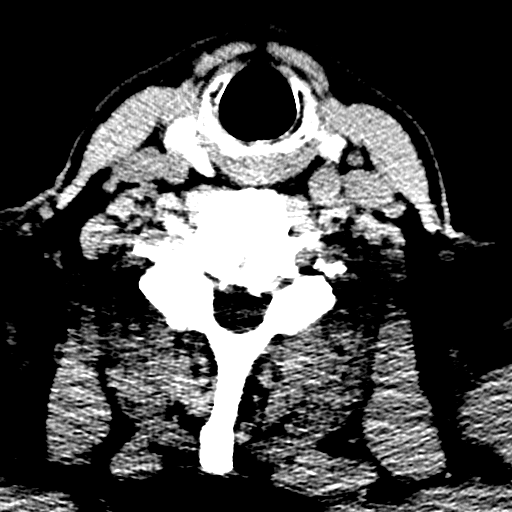
[im 58/115  brain]
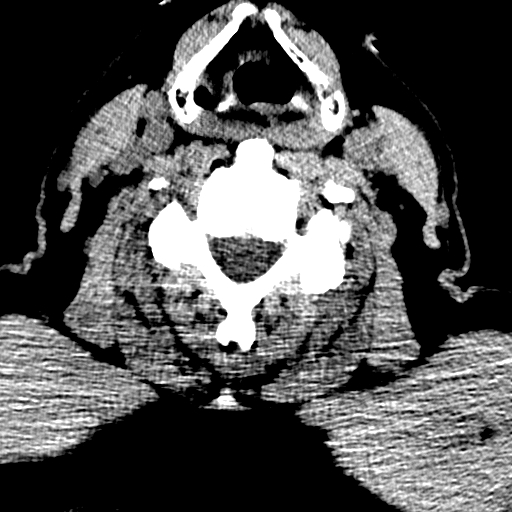
[im 58/115  bone]
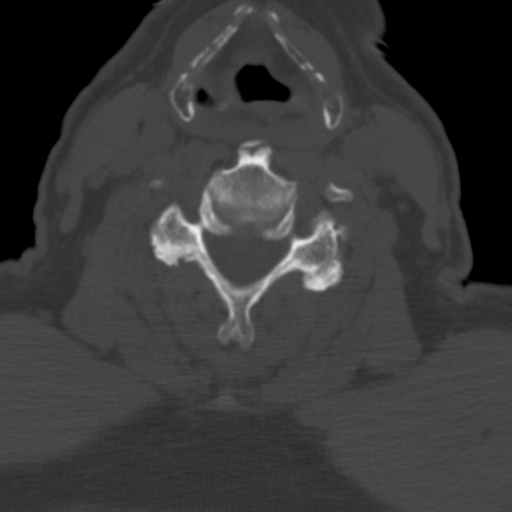
[im 69/115  brain]
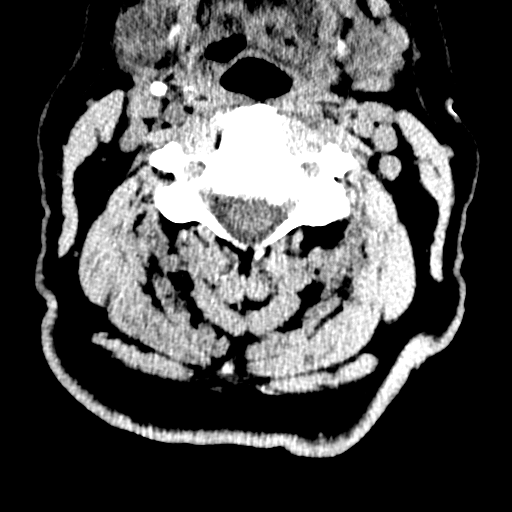
[im 80/115  brain]
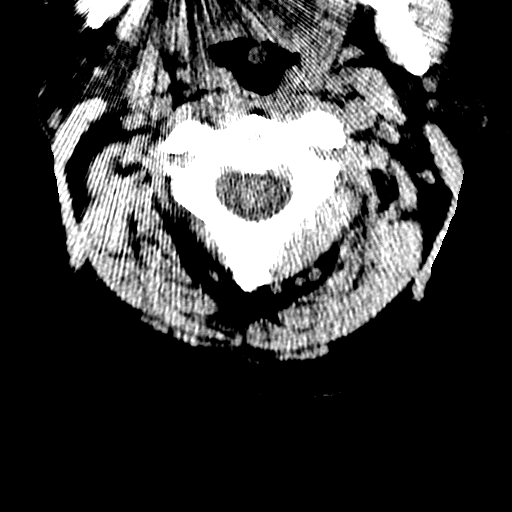
[im 92/115  brain]
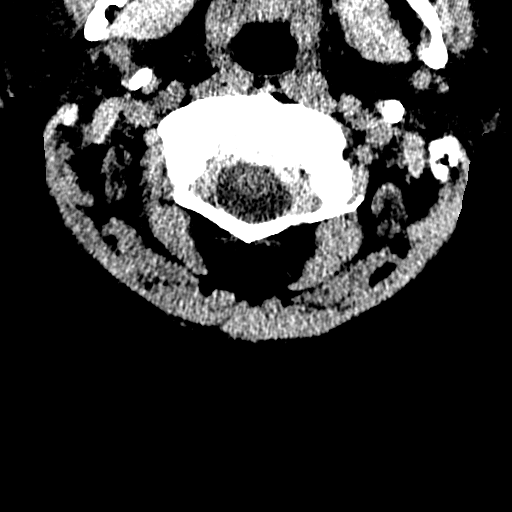
[im 103/115  brain]
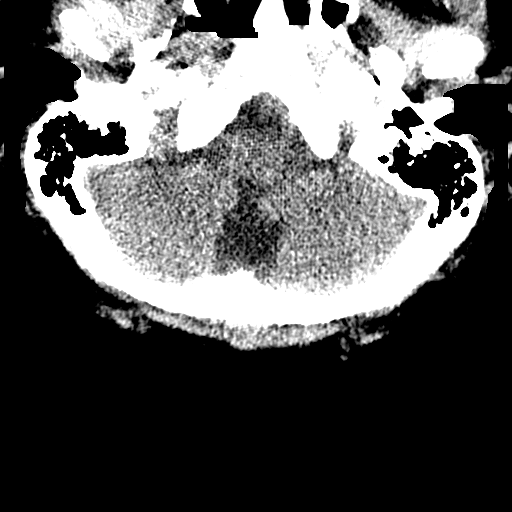
[im 103/115  bone]
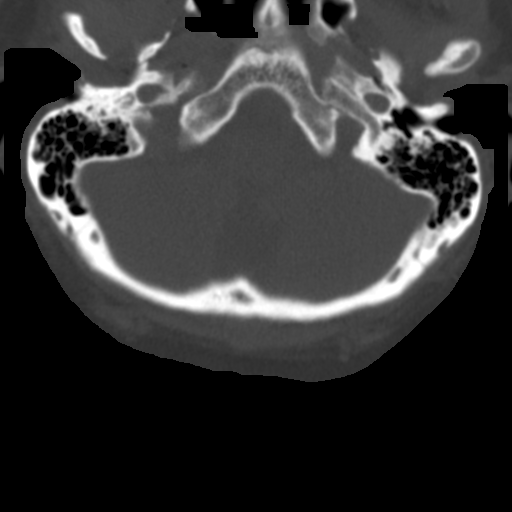

[cor · coronal · 0.44mm/px · 3 of 36 slices shown]
[im 12/36  brain]
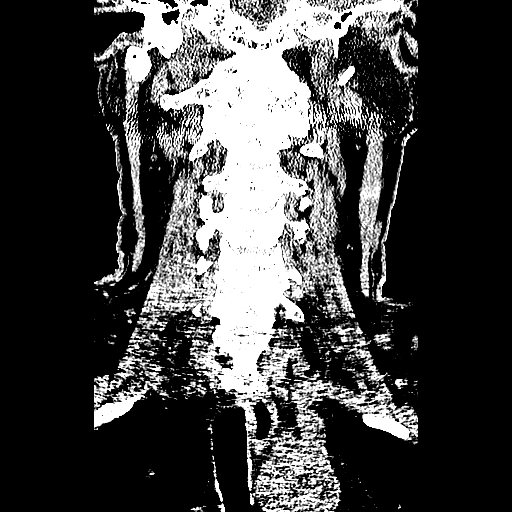
[im 16/36  brain]
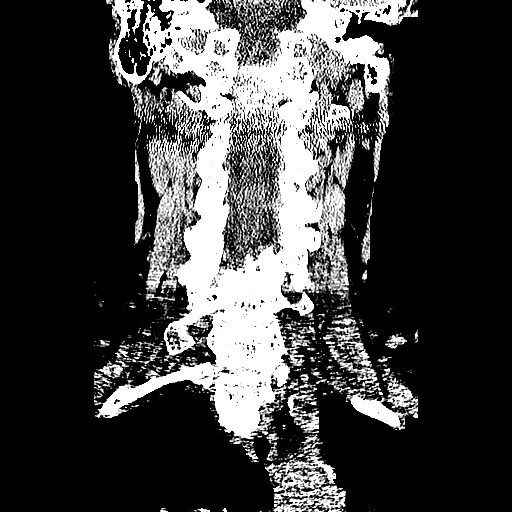
[im 20/36  brain]
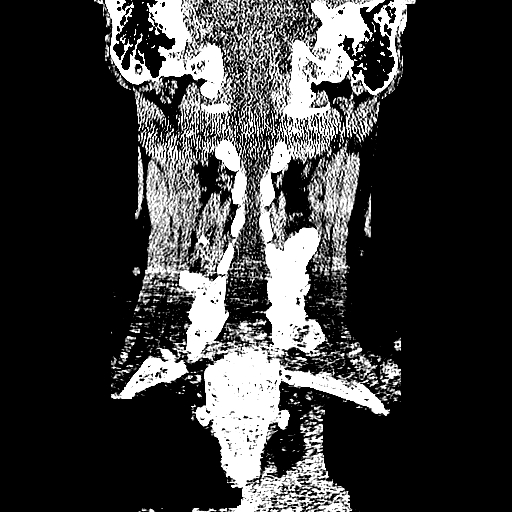

[sag · sagittal · 0.44mm/px · 3 of 44 slices shown]
[im 15/44  brain]
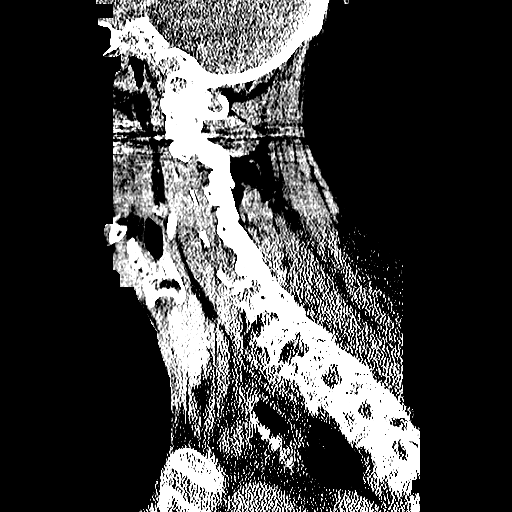
[im 22/44  brain]
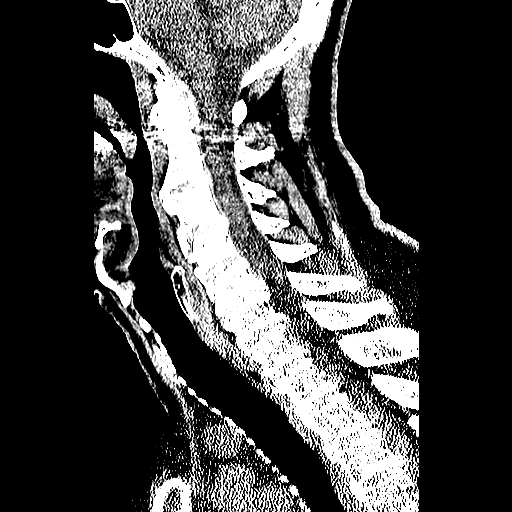
[im 29/44  brain]
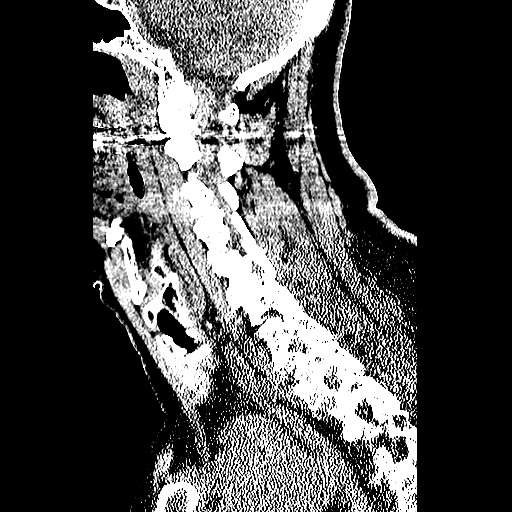

[15 of 47 positions shown; findings below may reference images not displayed]

FINDINGS: There is no evidence of acute intracranial hemorrhage,
mass lesion, brain edema or extra-axial fluid collection.  The
ventricles and subarachnoid spaces are appropriately sized for age.
There is no CT evidence of acute cortical infarction.

The visualized paranasal sinuses, mastoid air cells and middle ears
are clear.  There is prominent lucency surrounding the left
maxillary central incisor suspicious for periodontal disease or an
odontogenic cyst. The calvarium is intact.
IMPRESSION: No acute intracranial or calvarial findings.  Prominent lucency
surrounding the left maxillary central incisor.

CT CERVICAL SPINE
FINDINGS: The cervical alignment is stable.  There is no evidence
of acute fracture or traumatic subluxation.  Facet disease
asymmetric to the left and prominent calcified pannus surrounding
the odontoid process are stable.  There is some resulting foraminal
stenosis inferiorly in the cervical spine which appears stable.
Biapical pulmonary scarring appears mildly progressive.
IMPRESSION: 1.  Negative for acute cervical spine fracture, traumatic
subluxation or static signs instability.
2.  Similar multilevel spondylosis with resulting foraminal
narrowing at multiple levels.

## 2012-12-14 MED ORDER — PANTOPRAZOLE SODIUM 40 MG PO TBEC
40.0000 mg | DELAYED_RELEASE_TABLET | Freq: Every day | ORAL | Status: DC
  Administered 2012-12-15 – 2012-12-16 (×2): 40 mg via ORAL
  Filled 2012-12-14 (×2): qty 1

## 2012-12-14 MED ORDER — FENTANYL CITRATE 0.05 MG/ML IJ SOLN
50.0000 ug | INTRAMUSCULAR | Status: DC | PRN
  Administered 2012-12-14: 50 ug via INTRAVENOUS
  Filled 2012-12-14: qty 2

## 2012-12-14 MED ORDER — MORPHINE SULFATE 4 MG/ML IJ SOLN
4.0000 mg | Freq: Once | INTRAMUSCULAR | Status: AC
  Administered 2012-12-14: 4 mg via INTRAVENOUS
  Filled 2012-12-14: qty 1

## 2012-12-14 MED ORDER — PANTOPRAZOLE SODIUM 40 MG IV SOLR
40.0000 mg | Freq: Every day | INTRAVENOUS | Status: DC
  Filled 2012-12-14 (×3): qty 40

## 2012-12-14 MED ORDER — ACETAMINOPHEN 325 MG PO TABS: 650.0000 mg | ORAL_TABLET | ORAL | Status: DC | PRN

## 2012-12-14 MED ORDER — SODIUM CHLORIDE 0.9 % IV BOLUS (SEPSIS)
1000.0000 mL | Freq: Once | INTRAVENOUS | Status: AC
  Administered 2012-12-14: 1000 mL via INTRAVENOUS

## 2012-12-14 MED ORDER — ONDANSETRON HCL 4 MG/2ML IJ SOLN
4.0000 mg | Freq: Four times a day (QID) | INTRAMUSCULAR | Status: DC | PRN
  Administered 2012-12-15 – 2012-12-16 (×2): 4 mg via INTRAVENOUS
  Filled 2012-12-14 (×3): qty 2

## 2012-12-14 MED ORDER — DOCUSATE SODIUM 100 MG PO CAPS
100.0000 mg | ORAL_CAPSULE | Freq: Two times a day (BID) | ORAL | Status: DC
  Administered 2012-12-14 – 2012-12-17 (×5): 100 mg via ORAL
  Filled 2012-12-14 (×4): qty 1

## 2012-12-14 MED ORDER — FLUTICASONE PROPIONATE 50 MCG/ACT NA SUSP
2.0000 | Freq: Every day | NASAL | Status: DC
  Administered 2012-12-15 – 2012-12-17 (×2): 2 via NASAL
  Filled 2012-12-14: qty 16

## 2012-12-14 MED ORDER — IRBESARTAN 150 MG PO TABS
150.0000 mg | ORAL_TABLET | Freq: Every day | ORAL | Status: DC
  Administered 2012-12-15 – 2012-12-16 (×2): 150 mg via ORAL
  Filled 2012-12-14 (×3): qty 1

## 2012-12-14 MED ORDER — ONDANSETRON HCL 4 MG PO TABS: 4.0000 mg | ORAL_TABLET | Freq: Four times a day (QID) | ORAL | Status: DC | PRN

## 2012-12-14 MED ORDER — ATORVASTATIN CALCIUM 40 MG PO TABS
40.0000 mg | ORAL_TABLET | Freq: Every day | ORAL | Status: DC
  Administered 2012-12-15 – 2012-12-17 (×3): 40 mg via ORAL
  Filled 2012-12-14 (×7): qty 1

## 2012-12-14 MED ORDER — ATENOLOL 50 MG PO TABS
50.0000 mg | ORAL_TABLET | Freq: Every day | ORAL | Status: DC
  Administered 2012-12-15 – 2012-12-17 (×3): 50 mg via ORAL
  Filled 2012-12-14 (×4): qty 1

## 2012-12-14 MED ORDER — AMLODIPINE BESYLATE 5 MG PO TABS
5.0000 mg | ORAL_TABLET | Freq: Every day | ORAL | Status: DC
  Administered 2012-12-15 – 2012-12-16 (×2): 5 mg via ORAL
  Filled 2012-12-14 (×4): qty 1

## 2012-12-14 MED ORDER — POTASSIUM CHLORIDE IN NACL 20-0.9 MEQ/L-% IV SOLN
INTRAVENOUS | Status: DC
  Administered 2012-12-14: 22:00:00 via INTRAVENOUS
  Administered 2012-12-15: 20 mL/h via INTRAVENOUS
  Filled 2012-12-14 (×4): qty 1000

## 2012-12-14 MED ORDER — HYDROMORPHONE HCL PF 1 MG/ML IJ SOLN
1.0000 mg | INTRAMUSCULAR | Status: DC | PRN
  Administered 2012-12-14 – 2012-12-15 (×3): 1 mg via INTRAVENOUS
  Filled 2012-12-14 (×3): qty 1

## 2012-12-14 MED ORDER — OXYCODONE HCL 5 MG PO TABS
5.0000 mg | ORAL_TABLET | ORAL | Status: DC | PRN
  Administered 2012-12-15: 5 mg via ORAL
  Filled 2012-12-14: qty 1

## 2012-12-14 MED ORDER — IOHEXOL 300 MG/ML  SOLN
100.0000 mL | Freq: Once | INTRAMUSCULAR | Status: AC | PRN
  Administered 2012-12-14: 100 mL via INTRAVENOUS

## 2012-12-14 MED ORDER — OXYCODONE HCL 5 MG PO TABS: 10.0000 mg | ORAL_TABLET | ORAL | Status: DC | PRN

## 2012-12-14 NOTE — Consult Note (Addendum)
Reason for Consult:transverse process fx Referring Physician: trauma  Jared White is an 67 y.o. male.  HPI: Patient is a 67 year old right-handed white male who apparently fell about 10 feet from a roof flat onto his back. He notes that he had significant pain in his back some difficulty breathing but overall is able to move his legs immediately. He was brought to the [facility] emergency room where CT scanning revealed multiple rib fractures in addition to transverse process fractures of L1-L2 and L3. None the vertebrae were acutely broke the patient tells me that he had a history of a broken back in the past has also had some fracture to his neck in the past he gets himself lucky today.  Past Medical History  Diagnosis Date  . Broken back   . Irregular heart beat   . GERD (gastroesophageal reflux disease)   . CHF (congestive heart failure)   . Hypertension     Past Surgical History  Procedure Laterality Date  . Lung lobectomy      left upper lobe  . Bypass graft      History reviewed. No pertinent family history.  Social History:  has no tobacco, alcohol, and drug history on file.  Allergies: No Known Allergies  Medications: none  Results for orders placed during the hospital encounter of 12/14/12 (from the past 48 hour(s))  PROTIME-INR     Status: None   Collection Time    12/14/12  5:20 PM      Result Value Range   Prothrombin Time 13.9  11.6 - 15.2 seconds   INR 1.09  0.00 - 1.49  APTT     Status: Abnormal   Collection Time    12/14/12  5:20 PM      Result Value Range   aPTT 23 (*) 24 - 37 seconds  CBC     Status: Abnormal   Collection Time    12/14/12  5:20 PM      Result Value Range   WBC 13.6 (*) 4.0 - 10.5 K/uL   RBC 4.31  4.22 - 5.81 MIL/uL   Hemoglobin 13.6  13.0 - 17.0 g/dL   HCT 96.2 (*) 95.2 - 84.1 %   MCV 88.6  78.0 - 100.0 fL   MCH 31.6  26.0 - 34.0 pg   MCHC 35.6  30.0 - 36.0 g/dL   RDW 32.4  40.1 - 02.7 %   Platelets 170  150 - 400 K/uL   TROPONIN I     Status: None   Collection Time    12/14/12  5:27 PM      Result Value Range   Troponin I <0.30  <0.30 ng/mL   Comment:            Due to the release kinetics of cTnI,     a negative result within the first hours     of the onset of symptoms does not rule out     myocardial infarction with certainty.     If myocardial infarction is still suspected,     repeat the test at appropriate intervals.  POCT I-STAT, CHEM 8     Status: Abnormal   Collection Time    12/14/12  5:34 PM      Result Value Range   Sodium 145  135 - 145 mEq/L   Potassium 3.6  3.5 - 5.1 mEq/L   Chloride 107  96 - 112 mEq/L   BUN 19  6 - 23 mg/dL  Creatinine, Ser 1.30  0.50 - 1.35 mg/dL   Glucose, Bld 161 (*) 70 - 99 mg/dL   Calcium, Ion 0.96 (*) 1.13 - 1.30 mmol/L   TCO2 24  0 - 100 mmol/L   Hemoglobin 13.6  13.0 - 17.0 g/dL   HCT 04.5  40.9 - 81.1 %    Ct Head Wo Contrast  12/14/2012   *RADIOLOGY REPORT*  Clinical Data:  Fall from roof.  Neck and back pain.  CT HEAD WITHOUT CONTRAST CT CERVICAL SPINE WITHOUT CONTRAST  Technique:  Multidetector CT imaging of the head and cervical spine was performed following the standard protocol without intravenous contrast.  Multiplanar CT image reconstructions of the cervical spine were also generated.  Comparison:  Cervical spine CT 03/04/2010.  CT HEAD  Findings: There is no evidence of acute intracranial hemorrhage, mass lesion, brain edema or extra-axial fluid collection.  The ventricles and subarachnoid spaces are appropriately sized for age. There is no CT evidence of acute cortical infarction.  The visualized paranasal sinuses, mastoid air cells and middle ears are clear.  There is prominent lucency surrounding the left maxillary central incisor suspicious for periodontal disease or an odontogenic cyst. The calvarium is intact.  IMPRESSION: No acute intracranial or calvarial findings.  Prominent lucency surrounding the left maxillary central incisor.  CT  CERVICAL SPINE  Findings: The cervical alignment is stable.  There is no evidence of acute fracture or traumatic subluxation.  Facet disease asymmetric to the left and prominent calcified pannus surrounding the odontoid process are stable.  There is some resulting foraminal stenosis inferiorly in the cervical spine which appears stable. Biapical pulmonary scarring appears mildly progressive.  IMPRESSION:  1.  Negative for acute cervical spine fracture, traumatic subluxation or static signs instability. 2.  Similar multilevel spondylosis with resulting foraminal narrowing at multiple levels.   Original Report Authenticated By: Carey Bullocks, M.D.   Ct Chest W Contrast  12/14/2012   *RADIOLOGY REPORT*  Clinical Data: Multiple trauma secondary to a fall from roof.  Back pain and right-sided chest pain.  CT CHEST WITH CONTRAST  Technique:  Multidetector CT imaging of the chest was performed following the standard protocol during bolus administration of intravenous contrast.  Contrast: OMNIPAQUE IOHEXOL 300 MG/ML  SOLN  Comparison: None.  Findings: There are cortical buckle fractures of the lateral aspects of the right fourth through ninth ribs.  There also fractures through the posterior aspects of the eighth and ninth and tenth ribs.  There are fractures of the costochondral junctions anteriorly of the left fourth and fifth ribs.  There are fractures of the right transverse processes of T11, T12, L1, L2 and L3.  There is borderline cardiomegaly.  Prior CABG.  Slight atelectasis at the lung bases, right more than left. Possible tiny right anterior basal pneumothorax.  No infiltrates or effusions.  The patient has old right rib fractures and what appears to be an old fracture of the medial aspect of the right scapula.  IMPRESSION:  1.  Multiple right rib fractures. 2.  Multiple right thoracic and lumbar transverse process fractures. 3.  Two left anterior costochondral left rib fractures. 4.  Probable old  fracture of the medial border of the left right scapula. 5.  Possible very tiny anterior basal right pneumothorax.   Original Report Authenticated By: Francene Boyers, M.D.   Ct Cervical Spine Wo Contrast  12/14/2012   *RADIOLOGY REPORT*  Clinical Data:  Fall from roof.  Neck and back pain.  CT HEAD  WITHOUT CONTRAST CT CERVICAL SPINE WITHOUT CONTRAST  Technique:  Multidetector CT imaging of the head and cervical spine was performed following the standard protocol without intravenous contrast.  Multiplanar CT image reconstructions of the cervical spine were also generated.  Comparison:  Cervical spine CT 03/04/2010.  CT HEAD  Findings: There is no evidence of acute intracranial hemorrhage, mass lesion, brain edema or extra-axial fluid collection.  The ventricles and subarachnoid spaces are appropriately sized for age. There is no CT evidence of acute cortical infarction.  The visualized paranasal sinuses, mastoid air cells and middle ears are clear.  There is prominent lucency surrounding the left maxillary central incisor suspicious for periodontal disease or an odontogenic cyst. The calvarium is intact.  IMPRESSION: No acute intracranial or calvarial findings.  Prominent lucency surrounding the left maxillary central incisor.  CT CERVICAL SPINE  Findings: The cervical alignment is stable.  There is no evidence of acute fracture or traumatic subluxation.  Facet disease asymmetric to the left and prominent calcified pannus surrounding the odontoid process are stable.  There is some resulting foraminal stenosis inferiorly in the cervical spine which appears stable. Biapical pulmonary scarring appears mildly progressive.  IMPRESSION:  1.  Negative for acute cervical spine fracture, traumatic subluxation or static signs instability. 2.  Similar multilevel spondylosis with resulting foraminal narrowing at multiple levels.   Original Report Authenticated By: Carey Bullocks, M.D.   Ct Abdomen Pelvis W  Contrast  12/14/2012   *RADIOLOGY REPORT*  Clinical Data: Back pain and right anterior chest pain secondary to a fall from roof.  CT ABDOMEN AND PELVIS WITH CONTRAST  Technique:  Multidetector CT imaging of the abdomen and pelvis was performed following the standard protocol during bolus administration of intravenous contrast.  Contrast: OMNIPAQUE IOHEXOL 300 MG/ML  SOLN  Comparison: CT scan the abdomen dated 05/02/2006  Findings: There are fractures of the right transverse processes of T11-L3.  Liver, spleen, pancreas, adrenal glands, and kidneys are normal except multiple benign-appearing cysts in the kidneys and solitary small renal calculi bilaterally.  There are multiple stones in the nondistended gallbladder.  No bile duct dilatation.  Numerous diverticula in the colon, primarily the sigmoid region.  No free air or free fluid in the abdomen.  There is an old compression deformity of the L2 vertebral body, unchanged.  IMPRESSION:  1.  Multiple right transverse process fractures.  No other acute abnormalities of the abdomen or pelvis. 2.  Cholelithiasis. 3.  Diverticulosis primarily of the distal colon. 4.  Old compression deformity of L2.   Original Report Authenticated By: Francene Boyers, M.D.    Review of Systems  Constitutional: Negative.   HENT: Negative.   Eyes: Negative.   Respiratory: Negative.   Cardiovascular: Negative.   Gastrointestinal: Negative.   Musculoskeletal: Positive for back pain.  Neurological: Negative.   Psychiatric/Behavioral: Negative.    Blood pressure 146/71, pulse 90, temperature 100 F (37.8 C), temperature source Oral, resp. rate 25, height 5\' 9"  (1.753 m), weight 85.5 kg (188 lb 7.9 oz), SpO2 95.00%. Physical Exam  Constitutional: He appears well-developed and well-nourished.  HENT:  Head: Atraumatic.  Eyes: Conjunctivae and EOM are normal.  Neck: Normal range of motion. Neck supple.  Respiratory: Effort normal and breath sounds normal.  GI: Soft.  Bowel sounds are normal.  Neurological:  Alert oriented able to move all 4 extremities well. Motor function good in iliopsoas quadriceps tibialis anterior and gastrocs bilaterally in lower extremities. 2+ reflexes in the patellae and the Achilles  both 1+ reflexes in biceps trace reflexes in triceps. Radial nerve exam is within the limits of normal    Assessment/Plan: Transverse process fractures of upper lumbar vertebrae on the right side at L1-L2 and L3. Vertebrae do not appear displaced or compressed. Numerous rib fractures. These are all stable injuries and should heal on her own. I do not believe that there is any role for bracing. Followup with me can be on a when necessary basis.  Willett Lefeber J 12/14/2012, 11:58 PM

## 2012-12-14 NOTE — ED Notes (Signed)
Pt c/o fall, 10 feet from roof. Denies LOC, some emesis on shirt per ems. Pt co back pain and right chest pain.

## 2012-12-14 NOTE — H&P (Signed)
History   Jared White is an 67 y.o. male.   Chief Complaint:  Chief Complaint  Patient presents with  . Fall    Fall This is a new problem. The current episode started today. Associated symptoms include chest pain. Pertinent negatives include no abdominal pain, coughing, headaches, myalgias, nausea, neck pain or vomiting.  The patient is a 67 yo male who was working on a roof, when he fell about 11 feet and landed on his back on concrete.  He did not hit his head.  No LOC.  Complains only of back and chest pain.  Hurts to take a deep breath.  No abdominal tenderness.  No cervical spine tenderness.  He has had previous left upper lobectomy and previous spine fractures.  He is on some type of anticoagulation, but he does not know the name of it.   Past Medical History  Diagnosis Date  . Broken back   . Irregular heart beat   . GERD (gastroesophageal reflux disease)   . CHF (congestive heart failure)   . Hypertension     Past Surgical History  Procedure Laterality Date  . Lung lobectomy      left upper lobe  . Bypass graft      History reviewed. No pertinent family history. Social History:  has no tobacco, alcohol, and drug history on file.  Allergies  No Known Allergies  Home Medications   Prior to Admission medications   Medication Sig Start Date End Date Taking? Authorizing Provider  amLODipine (NORVASC) 5 MG tablet Take 5 mg by mouth daily.   Yes Historical Provider, MD  atenolol (TENORMIN) 50 MG tablet Take 1 tablet (50 mg total) by mouth daily. 10/12/12  Yes Governor Rooks, MD  fluticasone [facility]) 50 MCG/ACT nasal spray Place 2 sprays into the nose daily.   Yes Historical Provider, MD  Halcinonide (HALOG) 0.1 % CREA Apply 1 application topically daily as needed.   Yes Historical Provider, MD  omeprazole (PRILOSEC) 20 MG capsule Take 20 mg by mouth daily.   Yes Historical Provider, MD  simvastatin (ZOCOR) 80 MG tablet Take 80 mg by mouth at bedtime.   Yes  Historical Provider, MD  valsartan (DIOVAN) 160 MG tablet Take 1 tablet (160 mg total) by mouth daily. 10/12/12  Yes Governor Rooks, MD    Trauma Course   Results for orders placed during the hospital encounter of 12/14/12 (from the past 48 hour(s))  PROTIME-INR     Status: None   Collection Time    12/14/12  5:20 PM      Result Value Range   Prothrombin Time 13.9  11.6 - 15.2 seconds   INR 1.09  0.00 - 1.49  APTT     Status: Abnormal   Collection Time    12/14/12  5:20 PM      Result Value Range   aPTT 23 (*) 24 - 37 seconds  CBC     Status: Abnormal   Collection Time    12/14/12  5:20 PM      Result Value Range   WBC 13.6 (*) 4.0 - 10.5 K/uL   RBC 4.31  4.22 - 5.81 MIL/uL   Hemoglobin 13.6  13.0 - 17.0 g/dL   HCT 16.1 (*) 09.6 - 04.5 %   MCV 88.6  78.0 - 100.0 fL   MCH 31.6  26.0 - 34.0 pg   MCHC 35.6  30.0 - 36.0 g/dL   RDW 40.9  81.1 - 91.4 %  Platelets 170  150 - 400 K/uL  TROPONIN I     Status: None   Collection Time    12/14/12  5:27 PM      Result Value Range   Troponin I <0.30  <0.30 ng/mL   Comment:            Due to the release kinetics of cTnI,     a negative result within the first hours     of the onset of symptoms does not rule out     myocardial infarction with certainty.     If myocardial infarction is still suspected,     repeat the test at appropriate intervals.  POCT I-STAT, CHEM 8     Status: Abnormal   Collection Time    12/14/12  5:34 PM      Result Value Range   Sodium 145  135 - 145 mEq/L   Potassium 3.6  3.5 - 5.1 mEq/L   Chloride 107  96 - 112 mEq/L   BUN 19  6 - 23 mg/dL   Creatinine, Ser 4.54  0.50 - 1.35 mg/dL   Glucose, Bld 098 (*) 70 - 99 mg/dL   Calcium, Ion 1.19 (*) 1.13 - 1.30 mmol/L   TCO2 24  0 - 100 mmol/L   Hemoglobin 13.6  13.0 - 17.0 g/dL   HCT 14.7  82.9 - 56.2 %   Ct Head Wo Contrast  12/14/2012   *RADIOLOGY REPORT*  Clinical Data:  Fall from roof.  Neck and back pain.  CT HEAD WITHOUT CONTRAST CT CERVICAL  SPINE WITHOUT CONTRAST  Technique:  Multidetector CT imaging of the head and cervical spine was performed following the standard protocol without intravenous contrast.  Multiplanar CT image reconstructions of the cervical spine were also generated.  Comparison:  Cervical spine CT 03/04/2010.  CT HEAD  Findings: There is no evidence of acute intracranial hemorrhage, mass lesion, brain edema or extra-axial fluid collection.  The ventricles and subarachnoid spaces are appropriately sized for age. There is no CT evidence of acute cortical infarction.  The visualized paranasal sinuses, mastoid air cells and middle ears are clear.  There is prominent lucency surrounding the left maxillary central incisor suspicious for periodontal disease or an odontogenic cyst. The calvarium is intact.  IMPRESSION: No acute intracranial or calvarial findings.  Prominent lucency surrounding the left maxillary central incisor.  CT CERVICAL SPINE  Findings: The cervical alignment is stable.  There is no evidence of acute fracture or traumatic subluxation.  Facet disease asymmetric to the left and prominent calcified pannus surrounding the odontoid process are stable.  There is some resulting foraminal stenosis inferiorly in the cervical spine which appears stable. Biapical pulmonary scarring appears mildly progressive.  IMPRESSION:  1.  Negative for acute cervical spine fracture, traumatic subluxation or static signs instability. 2.  Similar multilevel spondylosis with resulting foraminal narrowing at multiple levels.   Original Report Authenticated By: Carey Bullocks, M.D.   Ct Chest W Contrast  12/14/2012   *RADIOLOGY REPORT*  Clinical Data: Multiple trauma secondary to a fall from roof.  Back pain and right-sided chest pain.  CT CHEST WITH CONTRAST  Technique:  Multidetector CT imaging of the chest was performed following the standard protocol during bolus administration of intravenous contrast.  Contrast: OMNIPAQUE IOHEXOL 300  MG/ML  SOLN  Comparison: None.  Findings: There are cortical buckle fractures of the lateral aspects of the right fourth through ninth ribs.  There also fractures through the posterior  aspects of the eighth and ninth and tenth ribs.  There are fractures of the costochondral junctions anteriorly of the left fourth and fifth ribs.  There are fractures of the right transverse processes of T11, T12, L1, L2 and L3.  There is borderline cardiomegaly.  Prior CABG.  Slight atelectasis at the lung bases, right more than left. Possible tiny right anterior basal pneumothorax.  No infiltrates or effusions.  The patient has old right rib fractures and what appears to be an old fracture of the medial aspect of the right scapula.  IMPRESSION:  1.  Multiple right rib fractures. 2.  Multiple right thoracic and lumbar transverse process fractures. 3.  Two left anterior costochondral left rib fractures. 4.  Probable old fracture of the medial border of the left right scapula. 5.  Possible very tiny anterior basal right pneumothorax.   Original Report Authenticated By: Francene Boyers, M.D.   Ct Cervical Spine Wo Contrast  12/14/2012   *RADIOLOGY REPORT*  Clinical Data:  Fall from roof.  Neck and back pain.  CT HEAD WITHOUT CONTRAST CT CERVICAL SPINE WITHOUT CONTRAST  Technique:  Multidetector CT imaging of the head and cervical spine was performed following the standard protocol without intravenous contrast.  Multiplanar CT image reconstructions of the cervical spine were also generated.  Comparison:  Cervical spine CT 03/04/2010.  CT HEAD  Findings: There is no evidence of acute intracranial hemorrhage, mass lesion, brain edema or extra-axial fluid collection.  The ventricles and subarachnoid spaces are appropriately sized for age. There is no CT evidence of acute cortical infarction.  The visualized paranasal sinuses, mastoid air cells and middle ears are clear.  There is prominent lucency surrounding the left maxillary central  incisor suspicious for periodontal disease or an odontogenic cyst. The calvarium is intact.  IMPRESSION: No acute intracranial or calvarial findings.  Prominent lucency surrounding the left maxillary central incisor.  CT CERVICAL SPINE  Findings: The cervical alignment is stable.  There is no evidence of acute fracture or traumatic subluxation.  Facet disease asymmetric to the left and prominent calcified pannus surrounding the odontoid process are stable.  There is some resulting foraminal stenosis inferiorly in the cervical spine which appears stable. Biapical pulmonary scarring appears mildly progressive.  IMPRESSION:  1.  Negative for acute cervical spine fracture, traumatic subluxation or static signs instability. 2.  Similar multilevel spondylosis with resulting foraminal narrowing at multiple levels.   Original Report Authenticated By: Carey Bullocks, M.D.   Ct Abdomen Pelvis W Contrast  12/14/2012   *RADIOLOGY REPORT*  Clinical Data: Back pain and right anterior chest pain secondary to a fall from roof.  CT ABDOMEN AND PELVIS WITH CONTRAST  Technique:  Multidetector CT imaging of the abdomen and pelvis was performed following the standard protocol during bolus administration of intravenous contrast.  Contrast: OMNIPAQUE IOHEXOL 300 MG/ML  SOLN  Comparison: CT scan the abdomen dated 05/02/2006  Findings: There are fractures of the right transverse processes of T11-L3.  Liver, spleen, pancreas, adrenal glands, and kidneys are normal except multiple benign-appearing cysts in the kidneys and solitary small renal calculi bilaterally.  There are multiple stones in the nondistended gallbladder.  No bile duct dilatation.  Numerous diverticula in the colon, primarily the sigmoid region.  No free air or free fluid in the abdomen.  There is an old compression deformity of the L2 vertebral body, unchanged.  IMPRESSION:  1.  Multiple right transverse process fractures.  No other acute abnormalities of the  abdomen  or pelvis. 2.  Cholelithiasis. 3.  Diverticulosis primarily of the distal colon. 4.  Old compression deformity of L2.   Original Report Authenticated By: Francene Boyers, M.D.    Review of Systems  Constitutional: Negative for weight loss.  HENT: Negative for hearing loss, ear pain, neck pain, tinnitus and ear discharge.   Eyes: Negative for blurred vision, double vision, photophobia and pain.  Respiratory: Positive for shortness of breath. Negative for cough and sputum production.   Cardiovascular: Positive for chest pain.  Gastrointestinal: Negative for nausea, vomiting and abdominal pain.  Genitourinary: Negative for dysuria, urgency, frequency and flank pain.  Musculoskeletal: Positive for back pain. Negative for myalgias, joint pain and falls.  Neurological: Negative for dizziness, tingling, sensory change, focal weakness, loss of consciousness and headaches.  Endo/Heme/Allergies: Does not bruise/bleed easily.  Psychiatric/Behavioral: Negative for depression, memory loss and substance abuse. The patient is not nervous/anxious.     Blood pressure 131/61, pulse 79, temperature 99.1 F (37.3 C), temperature source Oral, resp. rate 31, SpO2 96.00%. Physical Exam  Constitutional: He is oriented to person, place, and time. He appears well-developed and well-nourished.  HENT:  Head: Normocephalic and atraumatic.  Eyes: EOM are normal. Pupils are equal, round, and reactive to light.  Neck: Normal range of motion. Neck supple.  Cardiovascular: Normal rate and regular rhythm.   Respiratory:  Tender anterior left chest; healed left lateral chest tube scars; healed median sternotomy incision  Significant posterior right chest pain with deep inspiration   GI: Soft. Bowel sounds are normal.  Abrasion across anterior abdomen  Musculoskeletal: Normal range of motion.  Abrasion elbow  Neurological: He is alert and oriented to person, place, and time.     Assessment/Plan Fall from  roof 1.  Right lateral rib fractures 4-9 2.  Right posterior rib fractures 8-10 3.  Left anterior costochondral fractures 4-5 4.  Right transverse process fractures T11-L3 5.  Possible small occult pneumothorax right side  Admit for pain control, mobilization, pulmonary toilet. Hold anticoagulation Neurosurgery consult regarding spine transverse process fractures   Brylyn Novakovich K. 12/14/2012, 8:08 PM   Procedures

## 2012-12-14 NOTE — ED Provider Notes (Signed)
CSN: [id number]     Arrival date & time 12/14/12  1708 History     First MD Initiated Contact with Patient 12/14/12 1711     Chief Complaint  Patient presents with  . Fall   (Consider location/radiation/quality/duration/timing/severity/associated sxs/prior Treatment) Patient is a 67 y.o. male presenting with fall. The history is provided by the patient and medical records. No language interpreter was used.  Fall Associated symptoms include arthralgias, chest pain and joint swelling. Pertinent negatives include no abdominal pain, coughing, diaphoresis, fatigue, fever, headaches, nausea, rash or vomiting.    Levar Fayson Sperl is a 67 y.o. male  with a hx of Lung surgery (removal of the Left upper lobe), CABG (on anticoagulation) presents to the Emergency Department complaining of acute, persistent back pain after a 10 foot fall off of a roof landing on his back approx prior to arrival.  Pt states the baord he was standing on slipped and he fell forward onto the roof, sliding down the slope on his abdomen and falling off the edge landing on his back.  Per EMS pt denies LOC, but was found with emesis on the scene that the pt does not remember.  Pt c/o t-spine back pain.  Associated symptoms include chest pain and pressure beginning after EMS.  Nothing makes the symptoms better or worse.  Pt denies fever, chills, neck pain, abd pain, diarrhea, numbness, tingling.     Past Medical History  Diagnosis Date  . Broken back   . Irregular heart beat   . GERD (gastroesophageal reflux disease)   . CHF (congestive heart failure)   . Hypertension    Past Surgical History  Procedure Laterality Date  . Lung lobectomy      left upper lobe  . Bypass graft     History reviewed. No pertinent family history. History  Substance Use Topics  . Smoking status: Not on file  . Smokeless tobacco: Not on file  . Alcohol Use: Not on file    Review of Systems  Constitutional: Negative for fever,  diaphoresis, appetite change, fatigue and unexpected weight change.  HENT: Negative for mouth sores and neck stiffness.   Eyes: Negative for visual disturbance.  Respiratory: Negative for cough, chest tightness, shortness of breath and wheezing.   Cardiovascular: Positive for chest pain.  Gastrointestinal: Negative for nausea, vomiting, abdominal pain, diarrhea and constipation.  Endocrine: Negative for polydipsia, polyphagia and polyuria.  Genitourinary: Negative for dysuria, urgency, frequency and hematuria.  Musculoskeletal: Positive for back pain, joint swelling and arthralgias.  Skin: Positive for wound. Negative for rash.  Allergic/Immunologic: Negative for immunocompromised state.  Neurological: Negative for syncope, light-headedness and headaches.  Hematological: Does not bruise/bleed easily.  Psychiatric/Behavioral: Negative for sleep disturbance. The patient is not nervous/anxious.     Allergies  Review of patient's allergies indicates no known allergies.  Home Medications   No current outpatient prescriptions on file. BP 146/71  Pulse 90  Temp(Src) 100 F (37.8 C) (Oral)  Resp 25  Ht 5\' 9"  (1.753 m)  Wt 188 lb 7.9 oz (85.5 kg)  BMI 27.82 kg/m2  SpO2 95% Physical Exam  Nursing note and vitals reviewed. Constitutional: He is oriented to person, place, and time. He appears well-developed and well-nourished. No distress.  Awake, alert, nontoxic appearance  HENT:  Head: Normocephalic and atraumatic.  Right Ear: Hearing, tympanic membrane, external ear and ear canal normal. No hemotympanum.  Left Ear: Hearing, tympanic membrane, external ear and ear canal normal. No hemotympanum.  Nose:  Nose normal.  Mouth/Throat: Uvula is midline, oropharynx is clear and moist and mucous membranes are normal. No edematous. No oropharyngeal exudate, posterior oropharyngeal edema, posterior oropharyngeal erythema or tonsillar abscesses.  Eyes: Conjunctivae and EOM are normal. Pupils are  equal, round, and reactive to light. No scleral icterus.  Neck: Normal range of motion. Neck supple.  Pt in c-collar - no midline tenderness on trauma exam  Cardiovascular: Normal rate, regular rhythm, S1 normal, S2 normal, normal heart sounds and intact distal pulses.   Pulses:      Radial pulses are 2+ on the right side, and 2+ on the left side.       Dorsalis pedis pulses are 2+ on the right side, and 2+ on the left side.       Posterior tibial pulses are 2+ on the right side, and 2+ on the left side.  Pulmonary/Chest: Effort normal. No accessory muscle usage. Not tachypneic. No respiratory distress. He has decreased breath sounds in the left upper field and the left middle field. He has no wheezes. He has no rhonchi. He has no rales. He exhibits no tenderness, no bony tenderness, no crepitus, no edema, no deformity, no swelling and no retraction.  Large well healed surgical incision noted to the L axilla and L upper back - pt reports this is from lung lobe removal  Abdominal: Soft. Bowel sounds are normal. He exhibits no mass. There is no tenderness. There is no rebound and no guarding.  Abrasion to the middle of the abd extending from above the umbilicus to the suprapubic region; hemostasis achieved  Abd soft and nontender; no rigidity  Musculoskeletal: Normal range of motion. He exhibits no edema.       Right shoulder: He exhibits normal range of motion, no tenderness, no bony tenderness, no swelling, no effusion, no crepitus, no deformity, no laceration, no pain and no spasm.       Left shoulder: He exhibits normal range of motion, no tenderness, no bony tenderness, no swelling, no effusion, no crepitus, no deformity, no laceration and no pain.       Right elbow: He exhibits laceration. He exhibits normal range of motion, no swelling, no effusion and no deformity. No tenderness found.       Left elbow: He exhibits normal range of motion, no swelling, no effusion, no deformity and no  laceration.  Ecchymosis noted to bilateral forearms Significant pinpoint midline tenderness and right paraspinal tenderness to palpation of the T-spine  Neurological: He is alert and oriented to person, place, and time. He has normal strength. No cranial nerve deficit or sensory deficit. He exhibits normal muscle tone. Coordination normal. GCS eye subscore is 4. GCS verbal subscore is 5. GCS motor subscore is 6.  Reflex Scores:      Bicep reflexes are 2+ on the right side and 2+ on the left side.      Brachioradialis reflexes are 2+ on the right side and 2+ on the left side.      Patellar reflexes are 2+ on the right side and 2+ on the left side. Speech is clear and goal oriented, follows commands Major Cranial nerves without deficit, no facial droop Normal strength in upper and lower extremities bilaterally including dorsiflexion and plantar flexion, strong and equal grip strength Sensation normal to light and sharp touch in upper and lower terminates Normal finger to nose and rapid alternating movements  Skin: Skin is warm and dry. He is not diaphoretic.  Skin tear to  the right elbow Abrasion on abd  Psychiatric: He has a normal mood and affect.    ED Course   Procedures (including critical care time)  Labs Reviewed  APTT - Abnormal; Notable for the following:    aPTT 23 (*)    All other components within normal limits  CBC - Abnormal; Notable for the following:    WBC 13.6 (*)    HCT 38.2 (*)    All other components within normal limits  POCT I-STAT, CHEM 8 - Abnormal; Notable for the following:    Glucose, Bld 177 (*)    Calcium, Ion 1.12 (*)    All other components within normal limits  PROTIME-INR  TROPONIN I   ECG:  Date: 12/14/2012  Rate: 64  Rhythm: normal sinus rhythm  QRS Axis: normal  Intervals: normal  ST/T Wave abnormalities: nonspecific T wave changes  Conduction Disutrbances:none  Narrative Interpretation: Flattening of T waves in V2 noted, changed from  02/02/2001; otherwise nonischemic ECG  Old EKG Reviewed: changes noted    Ct Head Wo Contrast  12/14/2012   *RADIOLOGY REPORT*  Clinical Data:  Fall from roof.  Neck and back pain.  CT HEAD WITHOUT CONTRAST CT CERVICAL SPINE WITHOUT CONTRAST  Technique:  Multidetector CT imaging of the head and cervical spine was performed following the standard protocol without intravenous contrast.  Multiplanar CT image reconstructions of the cervical spine were also generated.  Comparison:  Cervical spine CT 03/04/2010.  CT HEAD  Findings: There is no evidence of acute intracranial hemorrhage, mass lesion, brain edema or extra-axial fluid collection.  The ventricles and subarachnoid spaces are appropriately sized for age. There is no CT evidence of acute cortical infarction.  The visualized paranasal sinuses, mastoid air cells and middle ears are clear.  There is prominent lucency surrounding the left maxillary central incisor suspicious for periodontal disease or an odontogenic cyst. The calvarium is intact.  IMPRESSION: No acute intracranial or calvarial findings.  Prominent lucency surrounding the left maxillary central incisor.  CT CERVICAL SPINE  Findings: The cervical alignment is stable.  There is no evidence of acute fracture or traumatic subluxation.  Facet disease asymmetric to the left and prominent calcified pannus surrounding the odontoid process are stable.  There is some resulting foraminal stenosis inferiorly in the cervical spine which appears stable. Biapical pulmonary scarring appears mildly progressive.  IMPRESSION:  1.  Negative for acute cervical spine fracture, traumatic subluxation or static signs instability. 2.  Similar multilevel spondylosis with resulting foraminal narrowing at multiple levels.   Original Report Authenticated By: Carey Bullocks, M.D.   Ct Chest W Contrast  12/14/2012   *RADIOLOGY REPORT*  Clinical Data: Multiple trauma secondary to a fall from roof.  Back pain and right-sided  chest pain.  CT CHEST WITH CONTRAST  Technique:  Multidetector CT imaging of the chest was performed following the standard protocol during bolus administration of intravenous contrast.  Contrast: OMNIPAQUE IOHEXOL 300 MG/ML  SOLN  Comparison: None.  Findings: There are cortical buckle fractures of the lateral aspects of the right fourth through ninth ribs.  There also fractures through the posterior aspects of the eighth and ninth and tenth ribs.  There are fractures of the costochondral junctions anteriorly of the left fourth and fifth ribs.  There are fractures of the right transverse processes of T11, T12, L1, L2 and L3.  There is borderline cardiomegaly.  Prior CABG.  Slight atelectasis at the lung bases, right more than left. Possible tiny right  anterior basal pneumothorax.  No infiltrates or effusions.  The patient has old right rib fractures and what appears to be an old fracture of the medial aspect of the right scapula.  IMPRESSION:  1.  Multiple right rib fractures. 2.  Multiple right thoracic and lumbar transverse process fractures. 3.  Two left anterior costochondral left rib fractures. 4.  Probable old fracture of the medial border of the left right scapula. 5.  Possible very tiny anterior basal right pneumothorax.   Original Report Authenticated By: Francene Boyers, M.D.   Ct Cervical Spine Wo Contrast  12/14/2012   *RADIOLOGY REPORT*  Clinical Data:  Fall from roof.  Neck and back pain.  CT HEAD WITHOUT CONTRAST CT CERVICAL SPINE WITHOUT CONTRAST  Technique:  Multidetector CT imaging of the head and cervical spine was performed following the standard protocol without intravenous contrast.  Multiplanar CT image reconstructions of the cervical spine were also generated.  Comparison:  Cervical spine CT 03/04/2010.  CT HEAD  Findings: There is no evidence of acute intracranial hemorrhage, mass lesion, brain edema or extra-axial fluid collection.  The ventricles and subarachnoid spaces are  appropriately sized for age. There is no CT evidence of acute cortical infarction.  The visualized paranasal sinuses, mastoid air cells and middle ears are clear.  There is prominent lucency surrounding the left maxillary central incisor suspicious for periodontal disease or an odontogenic cyst. The calvarium is intact.  IMPRESSION: No acute intracranial or calvarial findings.  Prominent lucency surrounding the left maxillary central incisor.  CT CERVICAL SPINE  Findings: The cervical alignment is stable.  There is no evidence of acute fracture or traumatic subluxation.  Facet disease asymmetric to the left and prominent calcified pannus surrounding the odontoid process are stable.  There is some resulting foraminal stenosis inferiorly in the cervical spine which appears stable. Biapical pulmonary scarring appears mildly progressive.  IMPRESSION:  1.  Negative for acute cervical spine fracture, traumatic subluxation or static signs instability. 2.  Similar multilevel spondylosis with resulting foraminal narrowing at multiple levels.   Original Report Authenticated By: Carey Bullocks, M.D.   Ct Abdomen Pelvis W Contrast  12/14/2012   *RADIOLOGY REPORT*  Clinical Data: Back pain and right anterior chest pain secondary to a fall from roof.  CT ABDOMEN AND PELVIS WITH CONTRAST  Technique:  Multidetector CT imaging of the abdomen and pelvis was performed following the standard protocol during bolus administration of intravenous contrast.  Contrast: OMNIPAQUE IOHEXOL 300 MG/ML  SOLN  Comparison: CT scan the abdomen dated 05/02/2006  Findings: There are fractures of the right transverse processes of T11-L3.  Liver, spleen, pancreas, adrenal glands, and kidneys are normal except multiple benign-appearing cysts in the kidneys and solitary small renal calculi bilaterally.  There are multiple stones in the nondistended gallbladder.  No bile duct dilatation.  Numerous diverticula in the colon, primarily the sigmoid  region.  No free air or free fluid in the abdomen.  There is an old compression deformity of the L2 vertebral body, unchanged.  IMPRESSION:  1.  Multiple right transverse process fractures.  No other acute abnormalities of the abdomen or pelvis. 2.  Cholelithiasis. 3.  Diverticulosis primarily of the distal colon. 4.  Old compression deformity of L2.   Original Report Authenticated By: Francene Boyers, M.D.   1. Multiple transverse process fractures   2. Multiple rib fractures, right, closed, initial encounter   3. Multiple rib fractures, left, closed, initial encounter   4. Fall from roof, initial  encounter    ECG  MDM  Delanna Ahmadi Morones presents as trauma patient after 10 foot fall with chest pain and back pain. Significant concern for possible T-spine fracture this patient has pinpoint pain to palpation of the spinous processes in the thoracic region. Will obtain chem 8, cardiac labs and imaging.  Chem 8 without evidence of kidney dysfunction - will obtain CT trauma scan.    Troponin negative, coags within normal limits, CBC with mild leukocytosis of 13.6.  CT with normal head and cervical spine. CT chest with multiple right and left rib fractures, possible very tiny basilar right pneumothorax and right transverse process fractures of T11-L3.  I personally reviewed the imaging tests through PACS system.  I reviewed available ER/hospitalization records through the EMR.  Discussed with trauma surgery who will admit. Consult with Dr. Danielle Dess of neurosurgery who will consult.  Patient given 2 doses of morphine with minimal pain relief.  Dr. Denton Lank was consulted, evaluated this patient with me and agrees with the plan.      Dahlia Client Hadli Vandemark, PA-C 12/15/12 0127

## 2012-12-14 NOTE — ED Notes (Signed)
Patient transported to CT 

## 2012-12-15 ENCOUNTER — Inpatient Hospital Stay (HOSPITAL_COMMUNITY): Payer: Medicare Other

## 2012-12-15 DIAGNOSIS — K219 Gastro-esophageal reflux disease without esophagitis: Secondary | ICD-10-CM | POA: Insufficient documentation

## 2012-12-15 DIAGNOSIS — I10 Essential (primary) hypertension: Secondary | ICD-10-CM | POA: Insufficient documentation

## 2012-12-15 DIAGNOSIS — E785 Hyperlipidemia, unspecified: Secondary | ICD-10-CM | POA: Insufficient documentation

## 2012-12-15 DIAGNOSIS — IMO0002 Reserved for concepts with insufficient information to code with codable children: Secondary | ICD-10-CM | POA: Diagnosis present

## 2012-12-15 DIAGNOSIS — I2510 Atherosclerotic heart disease of native coronary artery without angina pectoris: Secondary | ICD-10-CM | POA: Insufficient documentation

## 2012-12-15 DIAGNOSIS — W132XXA Fall from, out of or through roof, initial encounter: Secondary | ICD-10-CM

## 2012-12-15 IMAGING — CR DG CHEST 2V
2 series · 2 of 2 positions shown · non-contrast
Comparison: Yesterday CT chest

CLINICAL DATA: Rib fracture

CHEST - 2 VIEW

[w chest lat]
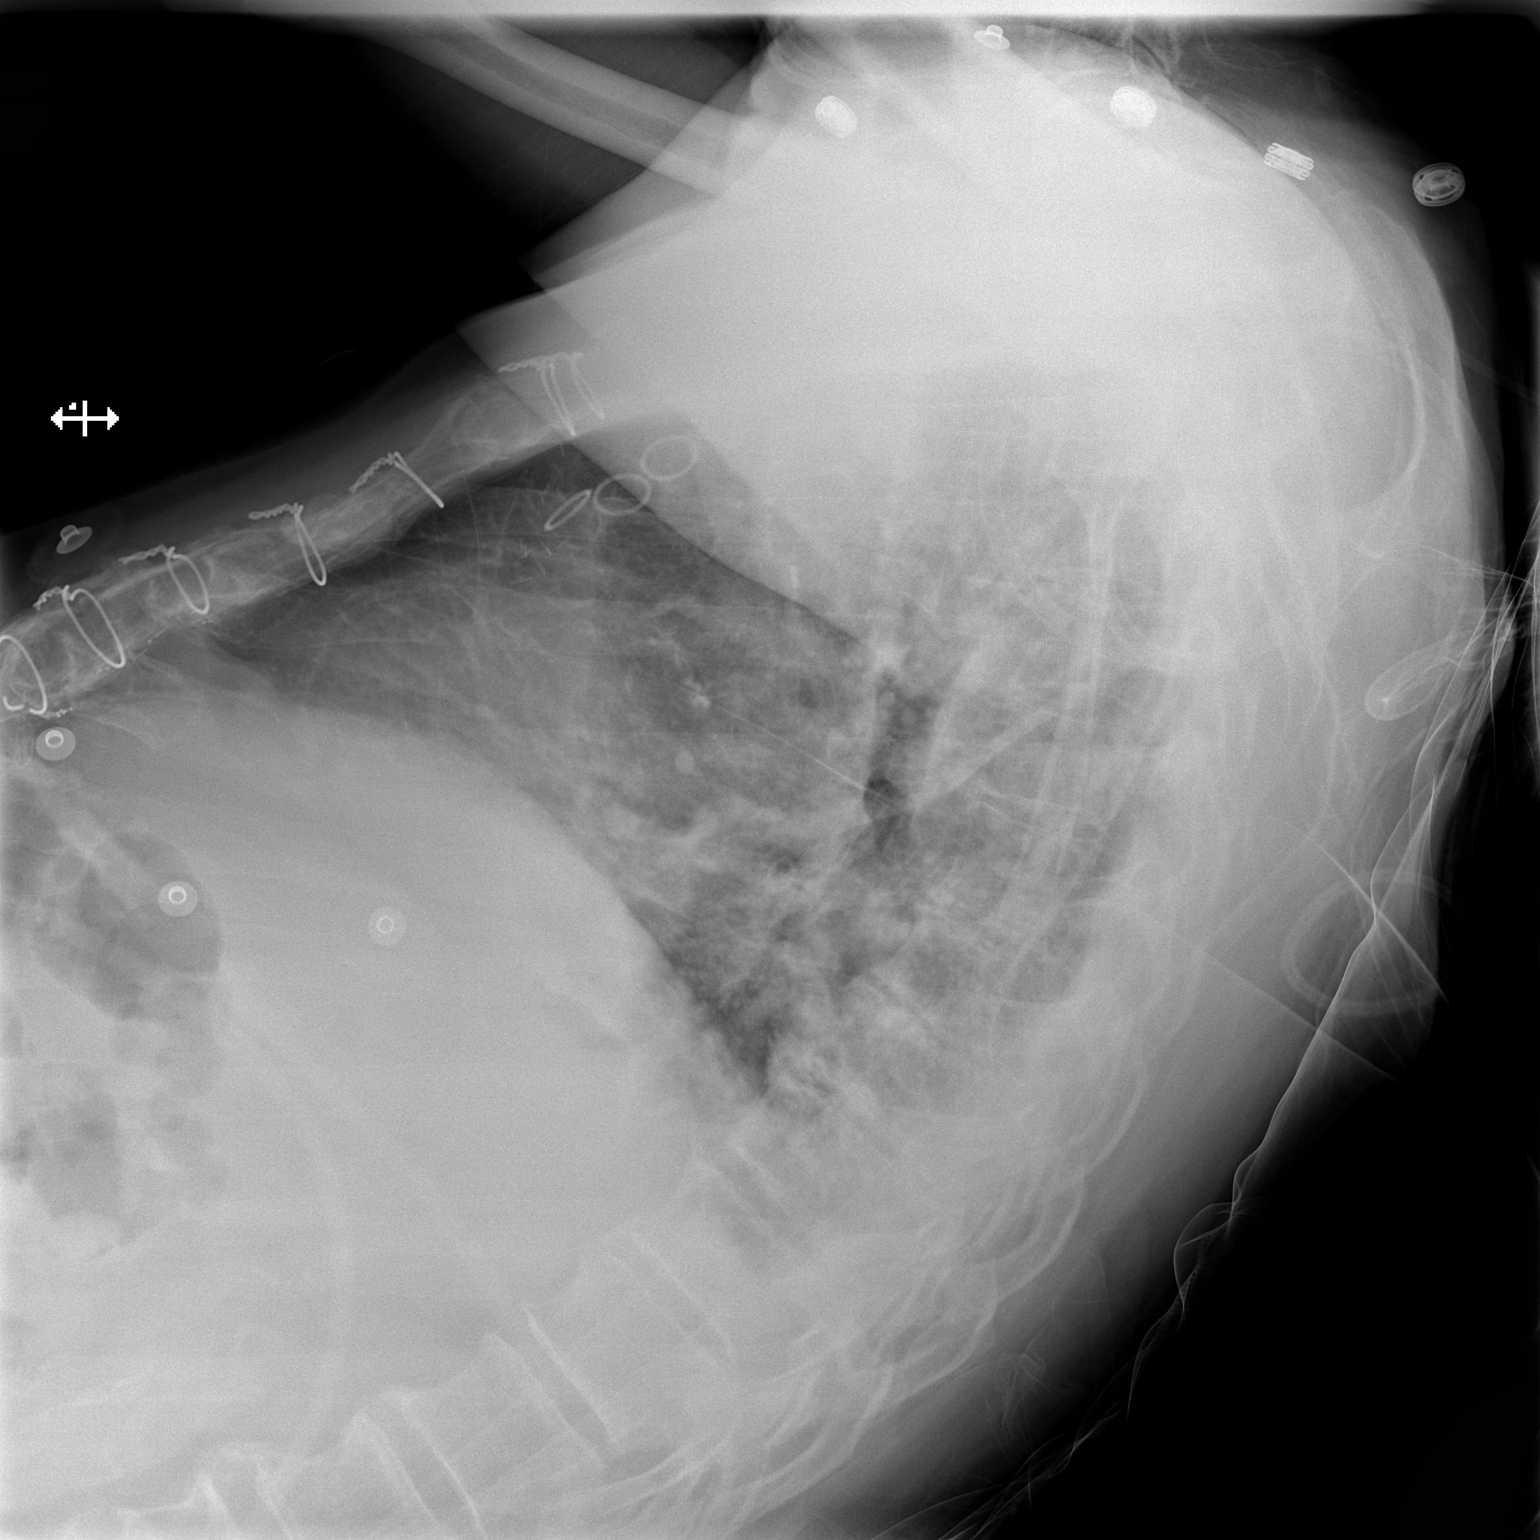

[x chest ap]
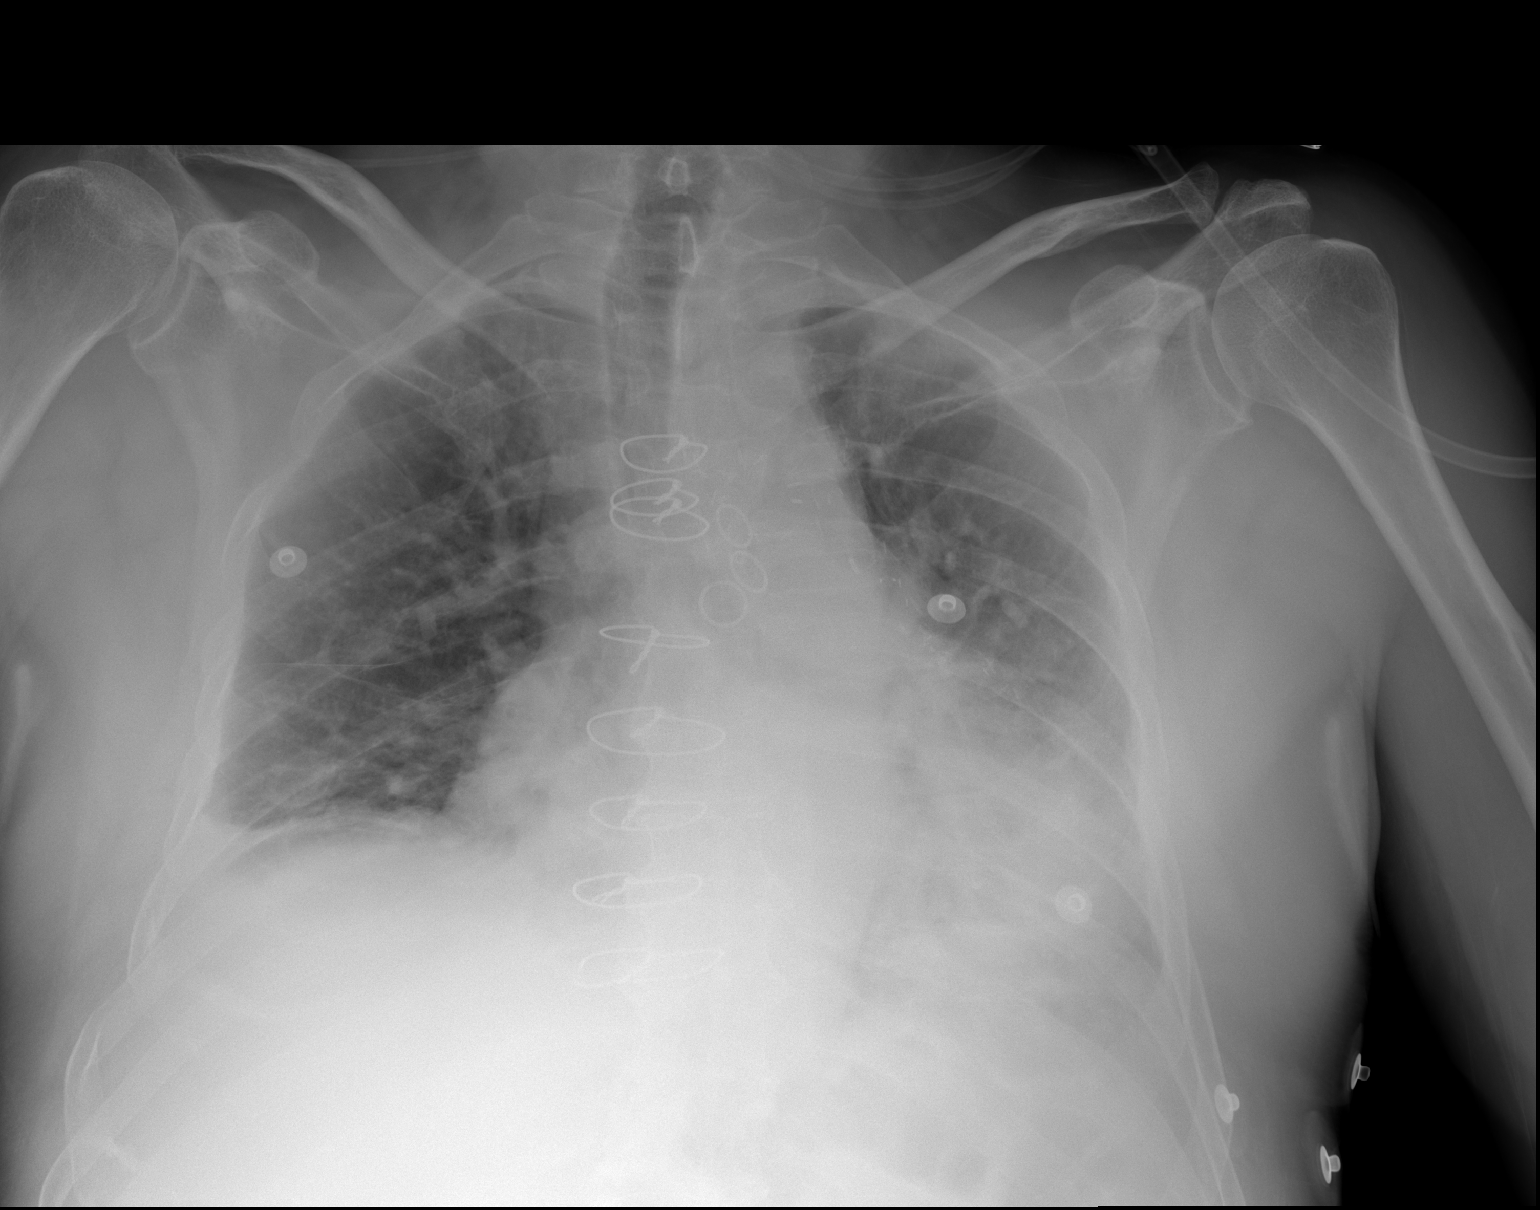

[2 of 2 positions shown; findings below may reference images not displayed]

FINDINGS: Moderate cardiomegaly.  Low lung volumes.  Bibasilar
patchy densities left greater than right.  No pneumothorax.
IMPRESSION: No pneumothorax.

Bibasilar patchy atelectasis verses airspace disease left greater
than right.

## 2012-12-15 MED ORDER — MORPHINE SULFATE (PF) 1 MG/ML IV SOLN
INTRAVENOUS | Status: DC
  Administered 2012-12-15: 7.5 mg via INTRAVENOUS
  Administered 2012-12-15 (×2): via INTRAVENOUS
  Administered 2012-12-15: 13.66 mg via INTRAVENOUS
  Administered 2012-12-16: 1.5 mg via INTRAVENOUS
  Administered 2012-12-16: 10.5 mg via INTRAVENOUS
  Filled 2012-12-15 (×2): qty 25

## 2012-12-15 MED ORDER — SODIUM CHLORIDE 0.9 % IJ SOLN: 9.0000 mL | INTRAMUSCULAR | Status: DC | PRN

## 2012-12-15 MED ORDER — NAPROXEN 500 MG PO TABS
500.0000 mg | ORAL_TABLET | Freq: Two times a day (BID) | ORAL | Status: DC
  Administered 2012-12-15 – 2012-12-17 (×4): 500 mg via ORAL
  Filled 2012-12-15 (×6): qty 1

## 2012-12-15 MED ORDER — DIPHENHYDRAMINE HCL 50 MG/ML IJ SOLN: 12.5000 mg | Freq: Four times a day (QID) | INTRAMUSCULAR | Status: DC | PRN

## 2012-12-15 MED ORDER — DIPHENHYDRAMINE HCL 12.5 MG/5ML PO ELIX: 12.5000 mg | ORAL_SOLUTION | Freq: Four times a day (QID) | ORAL | Status: DC | PRN

## 2012-12-15 MED ORDER — POLYETHYLENE GLYCOL 3350 17 G PO PACK
17.0000 g | PACK | Freq: Every day | ORAL | Status: DC
  Administered 2012-12-15 – 2012-12-16 (×2): 17 g via ORAL
  Filled 2012-12-15 (×4): qty 1

## 2012-12-15 MED ORDER — NALOXONE HCL 0.4 MG/ML IJ SOLN: 0.4000 mg | INTRAMUSCULAR | Status: DC | PRN

## 2012-12-15 MED ORDER — ENOXAPARIN SODIUM 40 MG/0.4ML ~~LOC~~ SOLN
40.0000 mg | SUBCUTANEOUS | Status: DC
  Administered 2012-12-15 – 2012-12-17 (×3): 40 mg via SUBCUTANEOUS
  Filled 2012-12-15 (×3): qty 0.4

## 2012-12-15 MED ORDER — METHOCARBAMOL 750 MG PO TABS
1500.0000 mg | ORAL_TABLET | Freq: Four times a day (QID) | ORAL | Status: DC
  Administered 2012-12-15 – 2012-12-16 (×5): 1500 mg via ORAL
  Filled 2012-12-15 (×12): qty 2

## 2012-12-15 NOTE — Progress Notes (Signed)
Patient interviewed and examined. I agree with the assessment and treatment plan outlined by Earney Hamburg, PA.  Only complaint is pain medicine not working. Morphine PCA to be started.  Not coughing well but does not appear dyspneic or any distress. Agree with chest x-ray.   Jared White. Derrell Lolling, M.D., [facility] General and Minimally invasive Surgery Breast and Colorectal Surgery Office:   [phone] Pager:   [phone]

## 2012-12-15 NOTE — Progress Notes (Signed)
Patient ID: Jared White, male   DOB: 05/12/1946, 67 y.o.   MRN: [id number]   LOS: 1 day   Subjective: C/o severe pain. OxyIR earlier made him nauseated but did nothing for pain. Says morphine has worked for him in the past but pills have never been effective.   Objective: Vital signs in last 24 hours: Temp:  [99.1 F (37.3 C)-100 F (37.8 C)] 99.2 F (37.3 C) (07/26 0625) Pulse Rate:  [68-90] 90 (07/26 0625) Resp:  [20-33] 20 (07/26 0625) BP: (113-147)/(59-73) 147/73 mmHg (07/26 0625) SpO2:  [92 %-96 %] 94 % (07/26 0625) Weight:  [188 lb 7.9 oz (85.5 kg)] 188 lb 7.9 oz (85.5 kg) (07/25 2300)    IS:   Radiology Results CXR: Pending   Physical Exam General appearance: alert and mild distress Resp: clear to auscultation bilaterally Cardio: regular rate and rhythm GI: normal findings: bowel sounds normal and soft, non-tender   Assessment/Plan: Fall Multiple bilateral rib fxs -- Pulmonary toilet, check CXR Thoracic/lumbar TVP fxs -- Pain control, add scheduled MR, watch for ileus Multiple medical problems -- Home meds FEN -- Will start morphine PCA to get control of pain and then plan to transition to orals tomorrow. Start NSAID. VTE -- SCD's, start Lovenox Dispo -- PT/OT consults   Freeman Caldron, PA-C Pager: [phone] General Trauma PA Pager: [phone]   12/15/2012

## 2012-12-16 MED ORDER — HYDROCODONE-ACETAMINOPHEN 10-325 MG PO TABS
0.5000 | ORAL_TABLET | ORAL | Status: DC | PRN
  Administered 2012-12-16 (×2): 1 via ORAL
  Filled 2012-12-16 (×2): qty 1

## 2012-12-16 MED ORDER — MORPHINE SULFATE 2 MG/ML IJ SOLN: 2.0000 mg | INTRAMUSCULAR | Status: DC | PRN

## 2012-12-16 MED ORDER — TRAMADOL HCL 50 MG PO TABS
100.0000 mg | ORAL_TABLET | Freq: Four times a day (QID) | ORAL | Status: DC
  Administered 2012-12-16 (×2): 100 mg via ORAL
  Filled 2012-12-16 (×2): qty 2

## 2012-12-16 MED ORDER — POLYETHYLENE GLYCOL 3350 17 G PO PACK
17.0000 g | PACK | Freq: Two times a day (BID) | ORAL | Status: DC
  Administered 2012-12-16: 17 g via ORAL
  Filled 2012-12-16: qty 1

## 2012-12-16 NOTE — Progress Notes (Signed)
Found some Prilosec pills on the patients bedside table. Patient reminded not to take his own medications. Patient said  medication ordered by the doctor (Protonix) does not work. Instructed patient to ask his family to take  his medications home and that we can order whatever he needs here. Will continue to monitor. KYoung RN

## 2012-12-16 NOTE — Progress Notes (Signed)
Patient interviewed and examined. He is slowly improving, but has significant atelectasis. Incentive spirometry and ambulation encouraged. MiraLAX ordered.  .  Agree with assessment and treatment plan outlined by Earney Hamburg, PA  Angelia Mould. Derrell Lolling, M.D., [facility] General and Minimally invasive Surgery Breast and Colorectal Surgery Office:   [phone] Pager:   [phone]

## 2012-12-16 NOTE — Plan of Care (Signed)
Problem: Acute Rehab PT Goals Goal: Pt Will Go Up/Down Stairs 3 steps to enter home; flight to access second floor

## 2012-12-16 NOTE — Evaluation (Signed)
Physical Therapy Evaluation Patient Details Name: Jared White MRN: [id number] DOB: [birth year] Today's Date: 12/16/2012 Time: 6295-2841 PT Time Calculation (min): 36 min  PT Assessment / Plan / Recommendation History of Present Illness    Patient is a 68 year old right-handed white male who apparently fell about 10 feet from a roof flat onto his back. He notes that he had significant pain in his back some difficulty breathing but overall is able to move his legs immediately. He was brought to the [facility] emergency room where CT scanning revealed multiple rib fractures in addition to transverse process fractures of L1-L2 and L3. None the vertebrae were acutely broke the patient tells me that he had a history of a broken back in the past has also had some fracture to his neck in the past he gets himself lucky today.   Clinical Impression  Pt presents with mild limitations to functional mobility primarily due to restricted trunk movement; pain seems rather well controlled.  Pt on 4L oxygen per [place], saturation at 90-94% at rest.  Ambulated on RA, noting SaO2 ranges 85-90%.  Educated on pursed lip breathing and necessity for optimal air movement. Pt cannot/does not consistently utilize breathing strategies and needs frequent cueing.  Has multiple stairs to climb at sister's home (his preferred d/c destination) but declines stair training this session.  Will see acutely toward mobility goals and expect he will be able to d/c home with sister once medically ready.    PT Assessment  Patient needs continued PT services    Follow Up Recommendations  No PT follow up;Supervision - Intermittent    Does the patient have the potential to tolerate intense rehabilitation      Barriers to Discharge Inaccessible home environment plans to stay with sister    Equipment Recommendations  None recommended by PT    Recommendations for Other Services     Frequency Min 5X/week    Precautions / Restrictions  Precautions Precautions: Fall Precaution Comments: multiple lines, PCA   Pertinent Vitals/Pain See note above.  Pain 3/10 at rest, 5/10 after walking.      Mobility  Bed Mobility Bed Mobility: Supine to Sit Supine to Sit: 5: Supervision;HOB flat Details for Bed Mobility Assistance: does not wait for instruction, however does move with relatively little demonstration of pain Transfers Transfers: Sit to Stand;Stand to Sit Sit to Stand: 5: Supervision;From bed Stand to Sit: 5: Supervision;To chair/3-in-1 Details for Transfer Assistance: pt does not wait for cues, but is able to physically stand unassisted with little demonstration of pain; minimal postural instability noted and pt quickly self-corrects Ambulation/Gait Ambulation/Gait Assistance: 4: Min guard Ambulation Distance (Feet): 150 Feet Assistive device: None Ambulation/Gait Assistance Details: cues for pacing and breath control noting unstable oxygen saturation on RA (on 4L [place] in room) with no over loss of balance or gait instabilty or leg weakness; pt declined stair training Gait Pattern: Step-through pattern Gait velocity: decreased  Stairs: No    Exercises     PT Diagnosis: Difficulty walking;Acute pain  PT Problem List: Pain;Decreased mobility;Decreased activity tolerance;Cardiopulmonary status limiting activity PT Treatment Interventions: Patient/family education;Therapeutic activities;Functional mobility training;Stair training     PT Goals(Current goals can be found in the care plan section) Acute Rehab PT Goals Patient Stated Goal: go back to work PT Goal Formulation: With patient Time For Goal Achievement: 12/23/12 Potential to Achieve Goals: Good  Visit Information  Last PT Received On: 12/16/12 Assistance Needed: +1       Prior Functioning  Home Living Family/patient expects to be discharged to:: Private residence Living Arrangements: Other relatives (lives alone, plans to d/c to sister's  home) Available Help at Discharge: Family Type of Home: House Home Access: Stairs to enter Secretary/administrator of Steps: 3 Entrance Stairs-Rails: Can reach both Home Layout: Multi-level (will need to access 2nd floor bedrooms) Alternate Level Stairs-Number of Steps: 10 Alternate Level Stairs-Rails: Can reach both Home Equipment: None Prior Function Level of Independence: Independent Communication Communication: No difficulties    Cognition  Cognition Arousal/Alertness: Awake/alert Behavior During Therapy: WFL for tasks assessed/performed;Flat affect Overall Cognitive Status: Within Functional Limits for tasks assessed    Extremity/Trunk Assessment Upper Extremity Assessment Upper Extremity Assessment: Overall WFL for tasks assessed;Defer to OT evaluation (reports some premorbid RUE dysfunction) Lower Extremity Assessment Lower Extremity Assessment: Overall WFL for tasks assessed   Balance    End of Session PT - End of Session Equipment Utilized During Treatment: Gait belt Activity Tolerance: Patient tolerated treatment well Patient left: in chair;with call bell/phone within reach Nurse Communication: Mobility status;Patient requests pain meds (reset PCA and replace O2 sensor)  GP     Dennis Bast 12/16/2012, 10:22 AM

## 2012-12-16 NOTE — Progress Notes (Signed)
Patient ID: Jared White, male   DOB: 01-16-1946, 67 y.o.   MRN: [id number]   LOS: 2 days   Subjective: Pain better controlled. Still struggling with nausea but no emesis.   Objective: Vital signs in last 24 hours: Temp:  [98.4 F (36.9 C)-99.3 F (37.4 C)] 98.4 F (36.9 C) (07/27 0619) Pulse Rate:  [61-84] 61 (07/27 0619) Resp:  [16-28] 22 (07/27 0619) BP: (112-149)/(60-71) 138/60 mmHg (07/27 0619) SpO2:  [19 %-97 %] 95 % (07/27 0619)    IS: (+575ml)   Physical Exam General appearance: alert and no distress Resp: clear to auscultation bilaterally Cardio: regular rate and rhythm GI: normal findings: bowel sounds normal and soft, non-tender   Assessment/Plan: Fall  Multiple bilateral rib fxs -- Pulmonary toilet, pain improved Thoracic/lumbar TVP fxs -- Pain control, scheduled MR, watch for ileus  Multiple medical problems -- Home meds  FEN -- Change to orals today, nausea may be a limiting factor, will try tramadol to reduce narcotic need  VTE -- SCD's, Lovenox  Dispo -- PT/OT consults    Freeman Caldron, PA-C Pager: [phone] General Trauma PA Pager: [phone]   12/16/2012

## 2012-12-17 ENCOUNTER — Other Ambulatory Visit: Payer: Self-pay | Admitting: *Deleted

## 2012-12-17 ENCOUNTER — Inpatient Hospital Stay (HOSPITAL_COMMUNITY): Payer: Medicare Other

## 2012-12-17 DIAGNOSIS — R011 Cardiac murmur, unspecified: Secondary | ICD-10-CM

## 2012-12-17 DIAGNOSIS — R0989 Other specified symptoms and signs involving the circulatory and respiratory systems: Secondary | ICD-10-CM

## 2012-12-17 DIAGNOSIS — S2611XA Contusion of heart without hemopericardium, initial encounter: Secondary | ICD-10-CM

## 2012-12-17 IMAGING — CR DG CHEST 2V
2 series · 2 of 2 positions shown · non-contrast
Comparison: [DATE]

CLINICAL DATA: Shortness of breath, rib fractures, fall, chest pain

CHEST - 2 VIEW

[w chest pa]
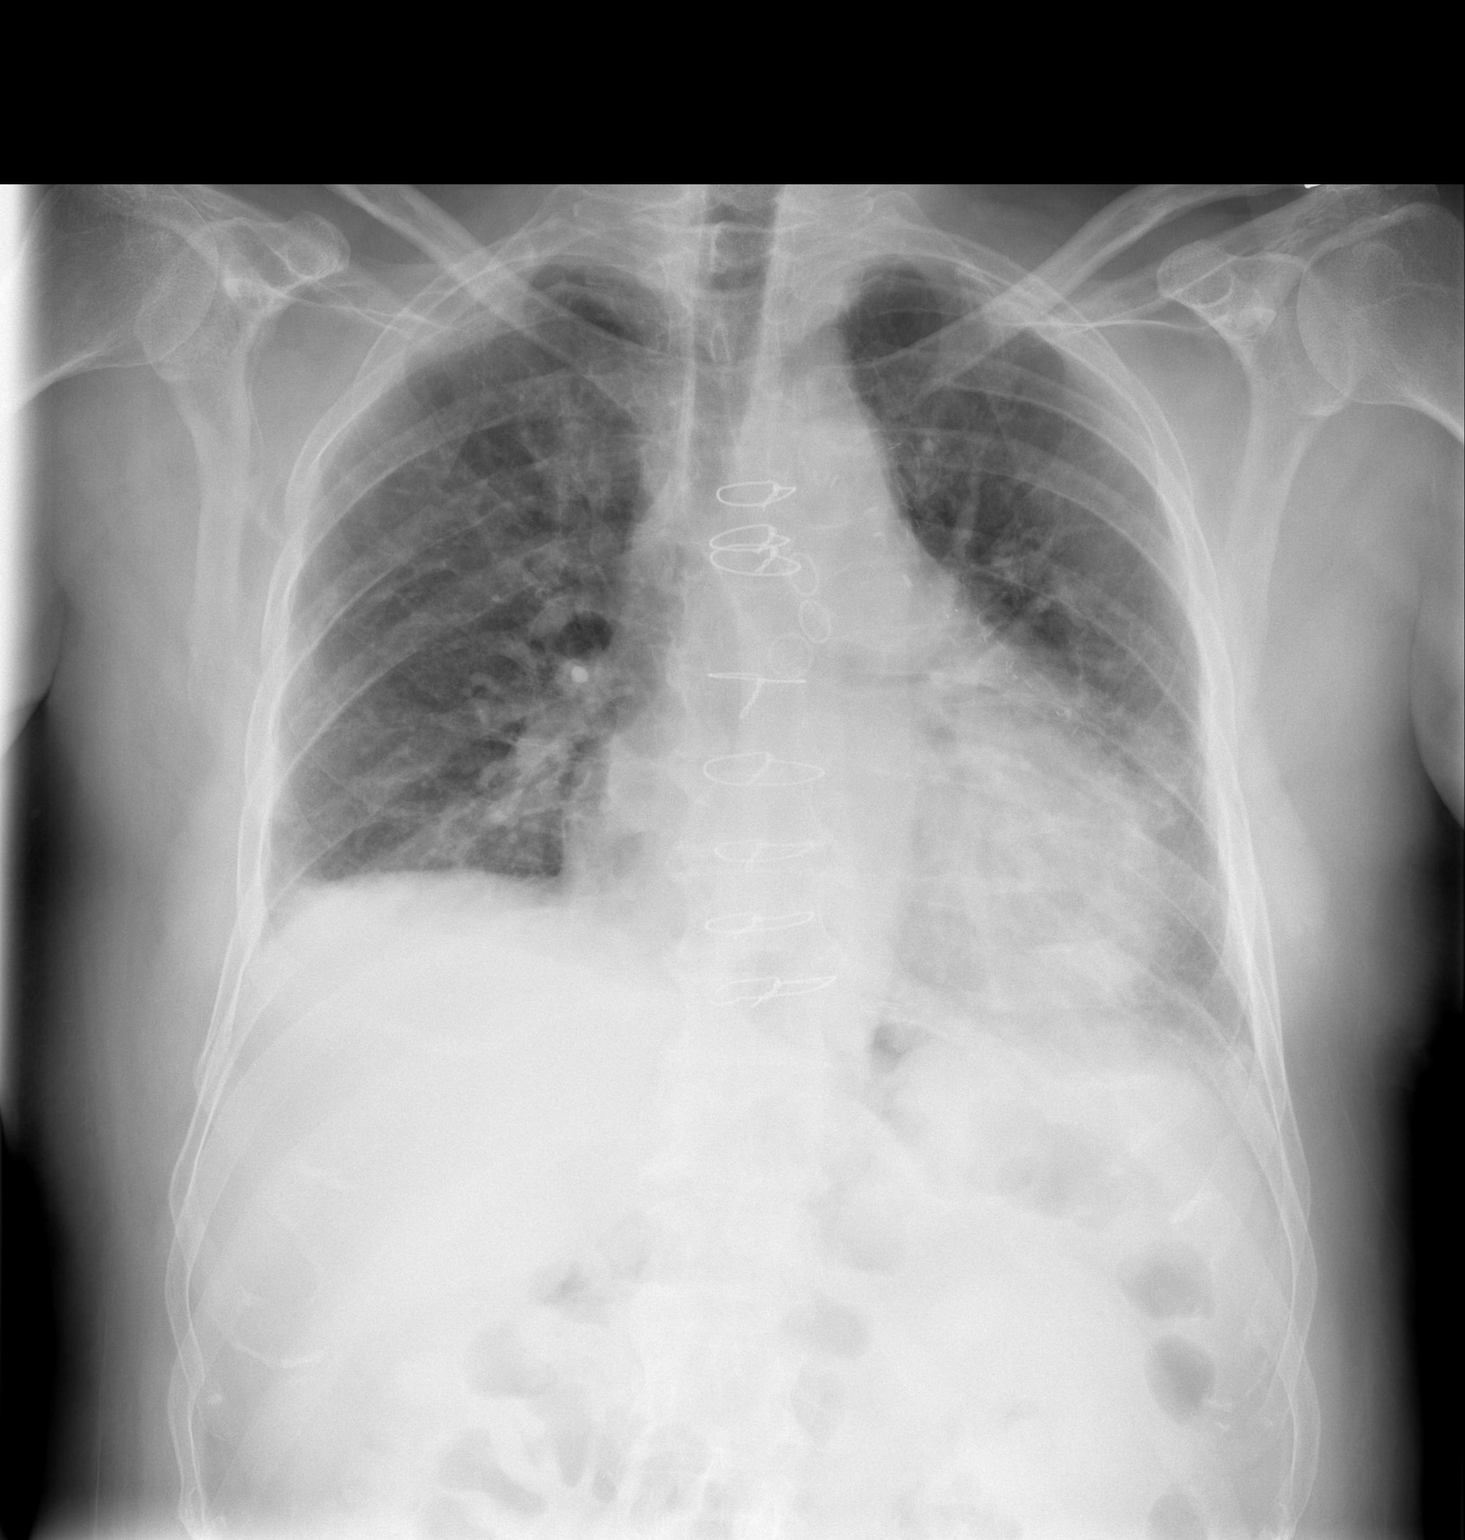

[w chest lat]
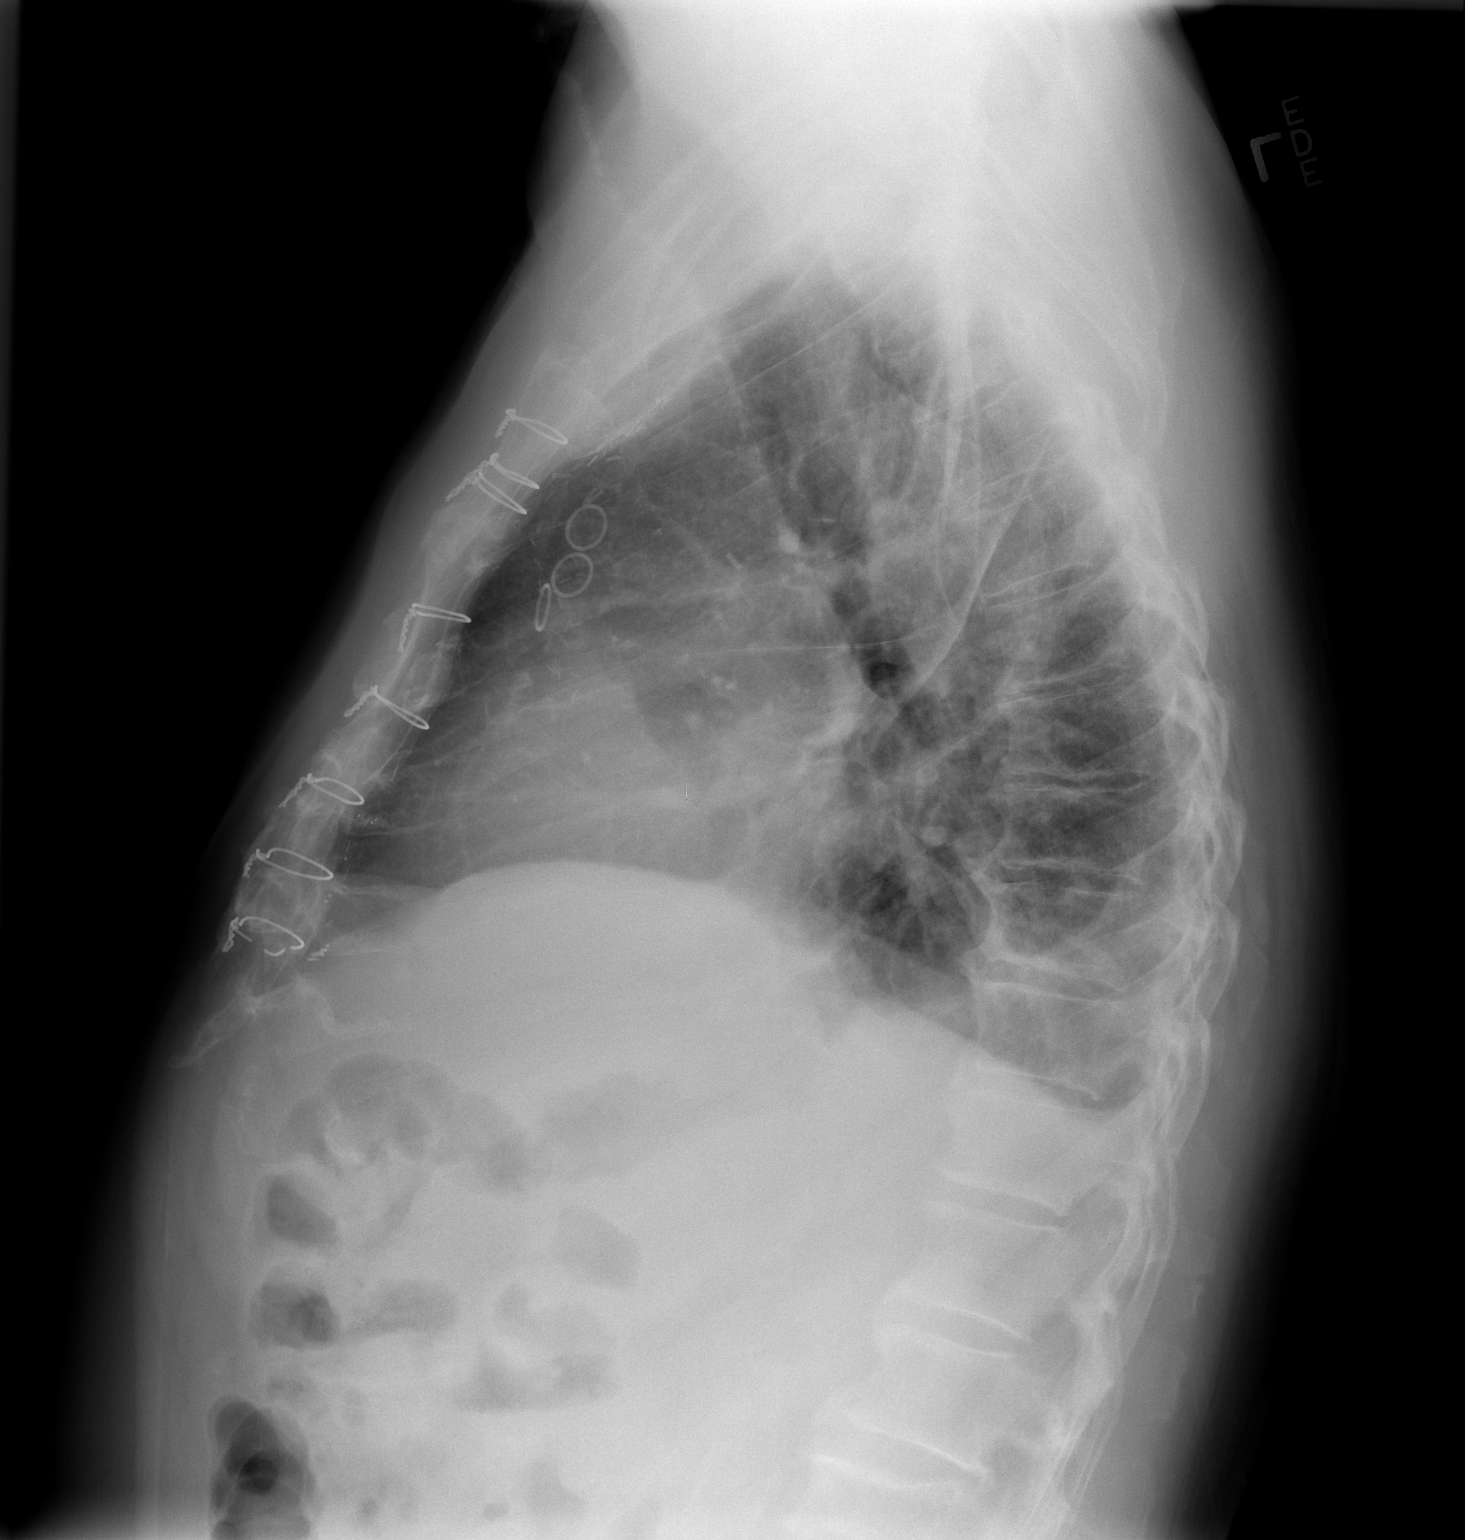

[2 of 2 positions shown; findings below may reference images not displayed]

FINDINGS: Right lateral seventh rib fracture re-identified; others
are not as well seen mass, prior dissimilar..  Evidence of CABG
with mild enlargement cardiac silhouette again noted. Lung volumes
are low with crowding of the bronchovascular markings.  Minimally
improved bibasilar atelectasis.  No pneumothorax identified.
IMPRESSION: Mildly improved aeration with persistent presumed bibasilar
atelectasis.

No pneumothorax with at least right lateral seventh rib fracture re-
identified.

## 2012-12-17 MED ORDER — DSS 100 MG PO CAPS: 100.0000 mg | ORAL_CAPSULE | Freq: Two times a day (BID) | ORAL | Status: DC | PRN

## 2012-12-17 MED ORDER — TRAMADOL HCL 50 MG PO TABS: 50.0000 mg | ORAL_TABLET | Freq: Four times a day (QID) | ORAL | Status: DC

## 2012-12-17 MED ORDER — METHOCARBAMOL 750 MG PO TABS: 750.0000 mg | ORAL_TABLET | Freq: Three times a day (TID) | ORAL | Status: DC | PRN

## 2012-12-17 MED ORDER — NAPROXEN 500 MG PO TABS: 500.0000 mg | ORAL_TABLET | Freq: Two times a day (BID) | ORAL | Status: DC

## 2012-12-17 MED ORDER — ACETAMINOPHEN 325 MG PO TABS: 650.0000 mg | ORAL_TABLET | Freq: Three times a day (TID) | ORAL | Status: DC | PRN

## 2012-12-17 NOTE — Discharge Summary (Signed)
This patient has been seen and I agree with the findings and treatment plan.  Eithan Beagle O. Rene Gonsoulin, III, MD, FACS [phone] (pager) [phone] (direct pager) Trauma Surgeon  

## 2012-12-17 NOTE — Evaluation (Signed)
Occupational Therapy Evaluation Patient Details Name: Jared White MRN: [id number] DOB: [birth year] Today's Date: 12/17/2012 Time: 1478-2956 OT Time Calculation (min): 12 min  OT Assessment / Plan / Recommendation History of present illness pt fell off room; suffered rib fx and t.p fxs   Clinical Impression   Patient evaluated by Occupational Therapy with no further acute OT needs identified. All education has been completed and the patient has no further questions. See below for any follow-up Occupational Therapy or equipment needs. OT is signing off. Thank you for this referral.     OT Assessment  Patient does not need any further OT services    Follow Up Recommendations  No OT follow up    Barriers to Discharge      Equipment Recommendations  None recommended by OT    Recommendations for Other Services    Frequency       Precautions / Restrictions Precautions Precautions: Fall Precaution Comments: multiple lines, PCA Restrictions Weight Bearing Restrictions: No   Pertinent Vitals/Pain     ADL  Eating/Feeding: Independent Where Assessed - Eating/Feeding: Chair Grooming: Wash/dry hands;Wash/dry face;Teeth care;Denture care;Brushing hair;Modified independent Where Assessed - Grooming: Supported sitting;Unsupported standing Upper Body Bathing: Set up Where Assessed - Upper Body Bathing: Unsupported sitting Lower Body Bathing: Supervision/safety Where Assessed - Lower Body Bathing: Unsupported standing Upper Body Dressing: Set up Where Assessed - Upper Body Dressing: Unsupported sitting Lower Body Dressing: Supervision/safety Where Assessed - Lower Body Dressing: Unsupported sit to stand Toilet Transfer: Modified independent Toilet Transfer Method: Sit to stand;Stand pivot Toilet Transfer Equipment: Comfort height toilet Toileting - Clothing Manipulation and Hygiene: Moderate assistance Where Assessed - Toileting Clothing Manipulation and Hygiene:  Standing Transfers/Ambulation Related to ADLs: modified independent ADL Comments: Pt requires encouragement to participate.  Pt. instructed to use LH spone/brush at home for LB bathing (reports he has one).  Discussed bracing/splinting chest when transitioning sit to stand, but pt resistive to this info.  Discussed lifting limitations with pt and implications with meal prep/household management.  Instructed pt to slide items over counter tops rather than carrying them.  Pt states he knows all of this info from pervious injuries.     OT Diagnosis:    OT Problem List:   OT Treatment Interventions:     OT Goals(Current goals can be found in the care plan section) Acute Rehab OT Goals Patient Stated Goal: to go home  Visit Information  Last OT Received On: 12/17/12 Assistance Needed: +1 History of Present Illness: pt fell off room; suffered rib fx and t.p fxs       Prior Functioning     Home Living Family/patient expects to be discharged to:: Private residence Living Arrangements: Other relatives (lives alone, plans to d/c to sister's home) Available Help at Discharge: Family Type of Home: House Home Access: Stairs to enter Secretary/administrator of Steps: 3 Entrance Stairs-Rails: Can reach both Home Layout: Multi-level (will need to access 2nd floor bedrooms) Alternate Level Stairs-Number of Steps: 10 Alternate Level Stairs-Rails: Can reach both Home Equipment: None Prior Function Level of Independence: Independent Communication Communication: No difficulties Dominant Hand: Right         Vision/Perception Vision - History Baseline Vision: Wears glasses all the time Patient Visual Report: No change from baseline   Cognition  Cognition Arousal/Alertness: Awake/alert Behavior During Therapy: WFL for tasks assessed/performed;Flat affect Overall Cognitive Status: Within Functional Limits for tasks assessed    Extremity/Trunk Assessment Upper Extremity Assessment Upper  Extremity Assessment: Overall WFL for  tasks assessed (Rt. forearm with edema.  Denies issues with Rt. UE)     Mobility Bed Mobility Bed Mobility: Not assessed Details for Bed Mobility Assistance: pt sitting in chair and returned to chair  Transfers Transfers: Sit to Stand;Stand to Sit Sit to Stand: 6: Modified independent (Device/Increase time);From chair/3-in-1;With armrests Stand to Sit: 6: Modified independent (Device/Increase time);To chair/3-in-1;With armrests Details for Transfer Assistance: pt mod i for transfers; requires increased time due to pain; no instability noticed      Exercise     Balance Balance Balance Assessed: Yes Static Standing Balance Static Standing - Balance Support: No upper extremity supported;During functional activity Static Standing - Level of Assistance: 6: Modified independent (Device/Increase time)   End of Session OT - End of Session Activity Tolerance: Patient tolerated treatment well  GO     Laqueisha Catalina M 12/17/2012, 11:45 AM

## 2012-12-17 NOTE — Progress Notes (Signed)
Physical Therapy Treatment Patient Details Name: Jared White MRN: [id number] DOB: 26-May-1945 Today's Date: 12/17/2012 Time: 6213-0865 PT Time Calculation (min): 10 min  PT Assessment / Plan / Recommendation  History of Present Illness pt fell off room; suffered rib fx and t.p fxs   Clinical Impression Pt continues to amb and perform activities without listening to cues. Was able to amb steps with supervision and min cues for safety. Pt stated he feels as though he is getting around fine but just hurts. Plans to D/C home with sister. Is safe from mobility standpoint. No further acute PT needs at this time.    PT Comments   Pt safe from mobility standpoint to D/C with sister at this time.   Follow Up Recommendations  No PT follow up;Supervision - Intermittent     Does the patient have the potential to tolerate intense rehabilitation     Barriers to Discharge        Equipment Recommendations  None recommended by PT    Recommendations for Other Services    Frequency Min 5X/week   Progress towards PT Goals Progress towards PT goals: Goals met/education completed, patient discharged from PT  Plan Current plan remains appropriate    Precautions / Restrictions Precautions Precautions: None Restrictions Weight Bearing Restrictions: No   Pertinent Vitals/Pain 3-4/10; primarily in ribs    Mobility  Bed Mobility Bed Mobility: Not assessed Details for Bed Mobility Assistance: pt sitting in chair and returned to chair  Transfers Transfers: Sit to Stand;Stand to Sit Sit to Stand: 6: Modified independent (Device/Increase time);From chair/3-in-1;With armrests Stand to Sit: 6: Modified independent (Device/Increase time);To chair/3-in-1;With armrests Details for Transfer Assistance: pt mod i for transfers; requires increased time due to pain; no instability noticed  Ambulation/Gait Ambulation/Gait Assistance: 5: Supervision Ambulation Distance (Feet): 180 Feet Assistive device:  None Ambulation/Gait Assistance Details: pt amb on RA and able to converse well during amb; c/o pain with amb; no instability or LOB noted Gait Pattern: Step-through pattern Gait velocity: decreased due to pain in ribs  Stairs: Yes Stairs Assistance: 5: Supervision;4: Min guard Stairs Assistance Details (indicate cue type and reason): pt does not wait for cues; supervision for safety; vc's to slow down gt speed and focus on safe technique  Stair Management Technique: One rail Right;Alternating pattern;Forwards Number of Stairs: 4 Wheelchair Mobility Wheelchair Mobility: No         PT Diagnosis:    PT Problem List:   PT Treatment Interventions:     PT Goals (current goals can now be found in the care plan section) Acute Rehab PT Goals Patient Stated Goal: to get out of here and get back to it PT Goal Formulation: With patient Time For Goal Achievement: 12/23/12 Potential to Achieve Goals: Good  Visit Information  Last PT Received On: 12/17/12 Assistance Needed: +1 History of Present Illness: pt fell off room; suffered rib fx and t.p fxs    Subjective Data  Subjective: "i dont have a problem getting around, its just painful. lets do the steps. I got to get out of here"  Patient Stated Goal: to get out of here and get back to it   Cognition  Cognition Arousal/Alertness: Awake/alert Behavior During Therapy: WFL for tasks assessed/performed;Flat affect Overall Cognitive Status: Within Functional Limits for tasks assessed    Balance  Balance Balance Assessed: Yes Static Standing Balance Static Standing - Balance Support: No upper extremity supported;During functional activity Static Standing - Level of Assistance: 6: Modified independent (Device/Increase time)  End of Session PT - End of Session Equipment Utilized During Treatment: Gait belt Activity Tolerance: Patient tolerated treatment well Patient left: in chair;with call bell/phone within reach Nurse Communication:  Mobility status   GP     Donell Sievert, [place] [phone] 12/17/2012, 9:39 AM

## 2012-12-17 NOTE — Discharge Summary (Signed)
Physician Discharge Summary  Patient ID: Jared White MRN: [id number] DOB/AGE: 06-14-1945 67 y.o.  Admit date: 12/14/2012 Discharge date: 12/17/2012  Discharge Diagnoses Patient Active Problem List   Diagnosis Date Noted  . Multiple thoracic/lumbar transverse process fractures 12/15/2012  . Fall from roof 12/15/2012  . CAD (coronary artery disease) 12/15/2012  . GERD (gastroesophageal reflux disease) 12/15/2012  . HTN (hypertension) 12/15/2012  . Hyperlipidemia 12/15/2012  . Multiple bilateral rib fractures     Consultants Dr Danielle Dess (Neurosurgery)  Procedures None   Hospital Course:  67 year old right-handed white male who apparently fell about 11 feet from a roof flat onto his back on concrete. He notes that he had significant pain in his back some difficulty breathing but overall is able to move his legs immediately. He was brought to the [facility] where CT scanning revealed multiple rib fractures in addition to transverse process fractures of L1-L2 and L3. He had a history of previous spine fractures.    Patient was admitted for observation and pain control.  Dr. Danielle Dess saw the patient and suggested since the fractures were not very displaced or compressed that they will heal on their own.  He also did not recommend a TLSO brace.  Diet was advanced as tolerated.  He began to have some nausea and confusion which seemed to be induced by the pain medication.  He was switched from norco to ultram with good success.  On HD #4, the patient was voiding well, tolerating diet, ambulating well, pain well controlled, vital signs stable, and felt stable for discharge home.  Patient will follow up in our office in 2 weeks as needed and knows to call with questions or concerns.  He should follow up with his spine surgeon Dr. Danielle Dess as well for further recommendations.    Physical Exam: General: pleasant, WD/WN white male who is laying in bed in NAD Heart: regular, rate, and rhythm.  Normal s1,s2. No  obvious murmurs, gallops, or rubs noted.   Lungs: CTAB, no wheezes, rhonchi, or rales noted.  Respiratory effort non-labored, IS at 1200-1500, B/l posterior ribs are tender Abd: soft, NT/ND, +BS, no masses, hernias, or organomegaly Back:  Ecchymosis in lumbar spine, tender to palp with light touch, Good CSM in LE      Medication List         acetaminophen 325 MG tablet  Commonly known as:  TYLENOL  Take 2 tablets (650 mg total) by mouth every 8 (eight) hours as needed for pain.     amLODipine 5 MG tablet  Commonly known as:  NORVASC  Take 5 mg by mouth daily.     atenolol 50 MG tablet  Commonly known as:  TENORMIN  Take 1 tablet (50 mg total) by mouth daily.     DSS 100 MG Caps  Take 100 mg by mouth 2 (two) times daily as needed for constipation.     fluticasone 50 MCG/ACT nasal spray  Commonly known as:  FLONASE  Place 2 sprays into the nose daily.     HALOG 0.1 % Crea  Generic drug:  Halcinonide  Apply 1 application topically daily as needed.     methocarbamol 750 MG tablet  Commonly known as:  ROBAXIN  Take 1-2 tablets (750-1,500 mg total) by mouth 3 (three) times daily as needed (muscle spasms).     naproxen 500 MG tablet  Commonly known as:  NAPROSYN  Take 1 tablet (500 mg total) by mouth 2 (two) times daily with a meal.  omeprazole 20 MG capsule  Commonly known as:  PRILOSEC  Take 20 mg by mouth daily.     simvastatin 80 MG tablet  Commonly known as:  ZOCOR  Take 80 mg by mouth at bedtime.     traMADol 50 MG tablet  Commonly known as:  ULTRAM  Take 1-2 tablets (50-100 mg total) by mouth every 6 (six) hours. For more moderate to severe pain     valsartan 160 MG tablet  Commonly known as:  DIOVAN  Take 1 tablet (160 mg total) by mouth daily.         Follow-up Information   Follow up with [facility]. Call in 2 weeks. (If symptoms worsen, as needed)    Contact information:   [address] [place]  [address] [phone]      Follow up with Stefani Dama, MD. Call in 2 weeks. (If symptoms worsen as needed)    Contact information:   [address] [place] [address] [place] [address] [phone]       Signed: Rueben Bash. Dort, [facility]  Trauma Service [phone]  12/17/2012, 8:38 AM

## 2012-12-18 NOTE — ED Provider Notes (Signed)
Medical screening examination/treatment/procedure(s) were conducted as a shared visit with non-physician practitioner(s) and myself.  I personally evaluated the patient during the encounter Pt fell from roof, single story. C/o rib and back pain. Xray. Cts. Iv. Pain rx. abd soft nt. c spine nt.   Suzi Roots, MD 12/18/12 [phone]

## 2012-12-21 ENCOUNTER — Telehealth: Payer: Self-pay | Admitting: Cardiovascular Disease

## 2012-12-21 NOTE — Telephone Encounter (Signed)
New script written for hydrocodone 10mg /325mg  of apap

## 2012-12-21 NOTE — Telephone Encounter (Signed)
Returned call.  Pt stated he wanted to speak to [facility].  Pt informed JC, LPN is seeing patients right now.  Pt stated he wants JC to call him back.  Pt informed message will be sent and asked if pt would like to leave a message as clinic is not complete until late afternoon.  Pt would like JC to send in the prescription for pain medicine he was supposed to send in yesterday b/c he needs it today.  Pt stated JC knows what he needs.  Pt informed JC will be notified.  Pt verbalized understanding and agreed w/ plan.

## 2012-12-21 NOTE — Telephone Encounter (Signed)
Jared White is in extreme pain and wants if there is something he can take .Marland Kitchen Does not have any pain pills .Marland Kitchen Please call    Thanks

## 2012-12-24 ENCOUNTER — Ambulatory Visit (HOSPITAL_COMMUNITY)
Admission: RE | Admit: 2012-12-24 | Discharge: 2012-12-24 | Disposition: A | Discharge status: Home / Self Care | Payer: Medicare Other | Source: Ambulatory Visit | Attending: Cardiovascular Disease | Admitting: Cardiovascular Disease

## 2012-12-24 DIAGNOSIS — I379 Nonrheumatic pulmonary valve disorder, unspecified: Secondary | ICD-10-CM | POA: Insufficient documentation

## 2012-12-24 DIAGNOSIS — I059 Rheumatic mitral valve disease, unspecified: Secondary | ICD-10-CM | POA: Insufficient documentation

## 2012-12-24 DIAGNOSIS — I517 Cardiomegaly: Secondary | ICD-10-CM | POA: Insufficient documentation

## 2012-12-24 DIAGNOSIS — I079 Rheumatic tricuspid valve disease, unspecified: Secondary | ICD-10-CM | POA: Insufficient documentation

## 2012-12-24 DIAGNOSIS — R011 Cardiac murmur, unspecified: Secondary | ICD-10-CM | POA: Insufficient documentation

## 2012-12-24 HISTORY — PX: TRANSTHORACIC ECHOCARDIOGRAM: SHX275

## 2012-12-24 NOTE — Progress Notes (Signed)
2D Echo Performed 12/24/2012    Creg Gilmer, RCS  

## 2012-12-27 ENCOUNTER — Encounter: Payer: Self-pay | Admitting: Cardiovascular Disease

## 2012-12-31 ENCOUNTER — Ambulatory Visit (HOSPITAL_COMMUNITY)
Admission: RE | Admit: 2012-12-31 | Discharge: 2012-12-31 | Disposition: A | Discharge status: Home / Self Care | Payer: Medicare Other | Source: Ambulatory Visit | Attending: Cardiovascular Disease | Admitting: Cardiovascular Disease

## 2012-12-31 DIAGNOSIS — R0989 Other specified symptoms and signs involving the circulatory and respiratory systems: Secondary | ICD-10-CM

## 2012-12-31 NOTE — Progress Notes (Signed)
Carotid Duplex Completed. Mckaila Duffus, BS, RDMS, RVT  

## 2013-01-02 ENCOUNTER — Telehealth: Payer: Self-pay | Admitting: Cardiovascular Disease

## 2013-01-02 NOTE — Telephone Encounter (Signed)
Wants you to call him about his medication please.

## 2013-01-02 NOTE — Telephone Encounter (Signed)
Talked with pt. and he was requesting more pain medication states his back is still bothering him. i told him i would let Dr. Alanda Amass know of his situation

## 2013-03-11 ENCOUNTER — Encounter: Payer: Self-pay | Admitting: *Deleted

## 2013-03-14 ENCOUNTER — Ambulatory Visit (INDEPENDENT_AMBULATORY_CARE_PROVIDER_SITE_OTHER): Payer: Medicare Other | Admitting: Internal Medicine

## 2013-03-14 ENCOUNTER — Encounter: Payer: Self-pay | Admitting: Internal Medicine

## 2013-03-14 VITALS — BP 136/80 | HR 67 | Ht 69.0 in | Wt 182.1 lb

## 2013-03-14 DIAGNOSIS — Z951 Presence of aortocoronary bypass graft: Secondary | ICD-10-CM

## 2013-03-14 DIAGNOSIS — I2510 Atherosclerotic heart disease of native coronary artery without angina pectoris: Secondary | ICD-10-CM

## 2013-03-14 DIAGNOSIS — I10 Essential (primary) hypertension: Secondary | ICD-10-CM

## 2013-03-14 DIAGNOSIS — E785 Hyperlipidemia, unspecified: Secondary | ICD-10-CM

## 2013-03-14 NOTE — Progress Notes (Signed)
OFFICE NOTE  Chief Complaint:  Routine follow-up, former Jared White patient  Primary Care Physician: No PCP Per Patient  HPI:  Jared White is a pleasant 67 year old male who is producing followed by Dr. Alanda White. He has a history of coronary artery disease and plain old balloon angioplasty to the OM in 1997. Subsequently he underwent CABG in 2001 by Dr. Donata Clay. This included a LIMA to LAD, SVG to diagonal, sequential SVG to ramus and OM and SVG to PDA. There is a strong family history of coronary disease in several brothers who died in their 88s and 42s. Recently he had trauma falling approximately 10 or 11 feet off of a roof and sustained multiple rib fractures. He had an undiagnosed right forearm fracture and was referred to [facility] orthopedics and is undergoing treatment for that. He says that it is poorly healing and he may be a candidate for a bone stimulator. Cardiac standpoint he denies any chest pain or worsening shortness of breath with exertion. He recently underwent carotid Dopplers in August of 2014 which showed very mild bilateral plaque. He also underwent an aortic Doppler in June of 2014 which did not show any aneurysm.  He is on medication for hypertension and dyslipidemia, both which were recently checked and well controlled.  PMHx:  Past Medical History  Diagnosis Date  . Broken back   . Irregular heart beat   . GERD (gastroesophageal reflux disease)   . CHF (congestive heart failure)   . Hypertension   . CAD (coronary artery disease)     PCI & CABG  . Hyperlipidemia   . Systemic hypertension   . Diverticulitis     mild - approx 2004  . Family history of heart disease   . History of nuclear stress test 06/30/2011    lexiscan; normal pattern of perfusion post-stress; low risk scan     Past Surgical History  Procedure Laterality Date  . Lung lobectomy      left upper lobe  . Coronary artery bypass graft  01/2000    x5 - LIMA to LAD, SVG to diagonal,  sequential SVG to ramus & OM, SVG to PDA (Dr. Kathlee Nations Trigt)  . Cardiac catheterization  08/1995    PTCA of OM (Dr. Jonette Eva)  . Transthoracic echocardiogram  12/24/2012    EF 55-60%, mild LVH, mild conc hypertrophy; mild MR; LA mildly dilated    FAMHx:  Family History  Problem Relation Age of Onset  . Heart attack Mother   . Cancer Father   . Kidney failure Brother     SOCHx:   reports that he has never smoked. He has never used smokeless tobacco. He reports that he drinks alcohol. He reports that he does not use illicit drugs.  ALLERGIES:  No Known Allergies  ROS: A comprehensive review of systems was negative except for: Musculoskeletal: positive for right arm pain, rib sorenes  HOME MEDS: Current Outpatient Prescriptions  Medication Sig Dispense Refill  . amLODipine (NORVASC) 5 MG tablet Take 5 mg by mouth daily.      Marland Kitchen aspirin 325 MG tablet Take 325 mg by mouth daily.      Marland Kitchen atenolol (TENORMIN) 50 MG tablet Take 1 tablet (50 mg total) by mouth daily.  30 tablet  1  . fluticasone (FLONASE) 50 MCG/ACT nasal spray Place 2 sprays into the nose daily.      . Halcinonide (HALOG) 0.1 % CREA Apply 1 application topically daily as needed.      Marland Kitchen  omeprazole (PRILOSEC) 20 MG capsule Take 20 mg by mouth daily.      . simvastatin (ZOCOR) 80 MG tablet Take 80 mg by mouth at bedtime.      . valsartan (DIOVAN) 160 MG tablet Take 1 tablet (160 mg total) by mouth daily.  30 tablet  1   No current facility-administered medications for this visit.    LABS/IMAGING: No results found for this or any previous visit (from the past 48 hour(s)). No results found.  VITALS: BP 136/80  Pulse 67  Ht 5\' 9"  (1.753 m)  Wt 182 lb 1.6 oz (82.6 kg)  BMI 26.88 kg/m2  EXAM: General appearance: alert and no distress Neck: no carotid bruit and no JVD Lungs: clear to auscultation bilaterally Heart: regular rate and rhythm, S1, S2 normal, no murmur, click, rub or gallop Abdomen: soft, non-tender;  bowel sounds normal; no masses,  no organomegaly Extremities: right arm is in a soft cast Pulses: 2+ and symmetric Skin: Skin color, texture, turgor normal. No rashes or lesions Neurologic: Grossly normal Psych: Pleasant, normal mood  EKG: Sinus rhythm with PACs at 67  ASSESSMENT: 1. Coronary artery disease status post four-vessel CABG in 2001 2. Recent trauma with rib fractures and right forearm fracture 3. Hypertension 4. Dyslipidemia  PLAN: 1.   [name] is still recovering from his trauma. He is having problems with healing of the right forearm fracture. He is being followed by [facility] orthopedics for this. He's had no problems regarding his coronary disease and was recently seen by Dr. Alanda White and underwent carotid and aortic Dopplers which were negative. His last stress test was in 2013 and was negative for ischemia. His last echocardiogram was in August of 2014 which showed an EF of 55-60% and reportedly normal diastolic function. There was mild mitral regurgitation. In all, Mr. Legler is doing fairly well from a cardiac standpoint but will need to be followed carefully. I plan to see him back in 6 months or sooner as necessary. Will likely need a repeat lipid profile at that time.  Chrystie Nose, MD, [facility] Attending Cardiologist [facility]  HILTY,Kenneth C 03/14/2013, 4:15 PM

## 2013-03-14 NOTE — Patient Instructions (Signed)
Follow up in 6 months 

## 2013-04-11 ENCOUNTER — Other Ambulatory Visit: Payer: Self-pay | Admitting: *Deleted

## 2013-04-11 MED ORDER — ATENOLOL 50 MG PO TABS: 50.0000 mg | ORAL_TABLET | Freq: Every day | ORAL | Status: DC

## 2013-04-11 MED ORDER — VALSARTAN 160 MG PO TABS: 160.0000 mg | ORAL_TABLET | Freq: Every day | ORAL | Status: DC

## 2013-07-23 ENCOUNTER — Telehealth: Payer: Self-pay | Admitting: *Deleted

## 2013-07-23 MED ORDER — ATENOLOL 50 MG PO TABS
50.0000 mg | ORAL_TABLET | Freq: Every day | ORAL | Status: DC
Start: 2013-07-23 — End: 2014-01-17

## 2013-07-23 MED ORDER — VALSARTAN 160 MG PO TABS: 160.0000 mg | ORAL_TABLET | Freq: Every day | ORAL | Status: DC

## 2013-07-23 NOTE — Telephone Encounter (Signed)
Pt was calling in regards to his Atenolol and Valsartan. He stated that the pharmacy has been waiting on the authorization for over a week now.  [place]

## 2013-07-23 NOTE — Telephone Encounter (Signed)
Returned call and pt verified x 2.  Pt informed message received and refill requests not received, likely b/c they were being sent electronically and Dr. Rollene Fare has retired and office cannot receive electronic requests for his prescriptions.  Informed RN will send now, but need to confirm pharmacy.  Pt uses [facility] [address]  Refill(s) sent to pharmacy.  Pt also advised to inform pharmacy that hardy copy refill requests needed for meds rx'd by Dr. Rollene Fare.  Pt verbalized understanding and agreed w/ plan.

## 2013-08-28 ENCOUNTER — Other Ambulatory Visit: Payer: Self-pay | Admitting: *Deleted

## 2013-08-28 MED ORDER — OMEPRAZOLE 20 MG PO CPDR: 20.0000 mg | DELAYED_RELEASE_CAPSULE | Freq: Every day | ORAL | Status: DC

## 2013-08-28 NOTE — Telephone Encounter (Signed)
Rx refill sent to patients pharmacy  

## 2013-11-21 ENCOUNTER — Ambulatory Visit (INDEPENDENT_AMBULATORY_CARE_PROVIDER_SITE_OTHER): Payer: Medicare Other | Admitting: Internal Medicine

## 2013-11-21 ENCOUNTER — Encounter: Payer: Self-pay | Admitting: Internal Medicine

## 2013-11-21 VITALS — BP 120/72 | HR 65 | Ht 69.0 in | Wt 180.8 lb

## 2013-11-21 DIAGNOSIS — L408 Other psoriasis: Secondary | ICD-10-CM

## 2013-11-21 DIAGNOSIS — I2510 Atherosclerotic heart disease of native coronary artery without angina pectoris: Secondary | ICD-10-CM

## 2013-11-21 DIAGNOSIS — L409 Psoriasis, unspecified: Secondary | ICD-10-CM

## 2013-11-21 DIAGNOSIS — I10 Essential (primary) hypertension: Secondary | ICD-10-CM

## 2013-11-21 DIAGNOSIS — Z951 Presence of aortocoronary bypass graft: Secondary | ICD-10-CM

## 2013-11-21 DIAGNOSIS — E785 Hyperlipidemia, unspecified: Secondary | ICD-10-CM

## 2013-11-21 DIAGNOSIS — Z79899 Other long term (current) drug therapy: Secondary | ICD-10-CM

## 2013-11-21 DIAGNOSIS — R252 Cramp and spasm: Secondary | ICD-10-CM

## 2013-11-21 DIAGNOSIS — I2584 Coronary atherosclerosis due to calcified coronary lesion: Secondary | ICD-10-CM

## 2013-11-21 DIAGNOSIS — Z139 Encounter for screening, unspecified: Secondary | ICD-10-CM

## 2013-11-21 LAB — COMPREHENSIVE METABOLIC PANEL
ALT: 14 U/L (ref 0–53)
AST: 17 U/L (ref 0–37)
Albumin: 3.7 g/dL (ref 3.5–5.2)
Alkaline Phosphatase: 75 U/L (ref 39–117)
BUN: 13 mg/dL (ref 6–23)
CO2: 25 mEq/L (ref 19–32)
Calcium: 8.6 mg/dL (ref 8.4–10.5)
Chloride: 106 mEq/L (ref 96–112)
Creat: 0.83 mg/dL (ref 0.50–1.35)
Glucose, Bld: 125 mg/dL — ABNORMAL HIGH (ref 70–99)
Potassium: 3.7 mEq/L (ref 3.5–5.3)
Sodium: 141 mEq/L (ref 135–145)
Total Bilirubin: 0.4 mg/dL (ref 0.2–1.2)
Total Protein: 6.3 g/dL (ref 6.0–8.3)

## 2013-11-21 LAB — CBC
HCT: 40.1 % (ref 39.0–52.0)
Hemoglobin: 14 g/dL (ref 13.0–17.0)
MCH: 30.4 pg (ref 26.0–34.0)
MCHC: 34.9 g/dL (ref 30.0–36.0)
MCV: 87 fL (ref 78.0–100.0)
Platelets: 173 10*3/uL (ref 150–400)
RBC: 4.61 MIL/uL (ref 4.22–5.81)
RDW: 14.6 % (ref 11.5–15.5)
WBC: 6.9 10*3/uL (ref 4.0–10.5)

## 2013-11-21 LAB — MAGNESIUM: Magnesium: 1.2 mg/dL — ABNORMAL LOW (ref 1.5–2.5)

## 2013-11-21 LAB — LIPID PANEL
Cholesterol: 121 mg/dL (ref 0–200)
HDL: 34 mg/dL — ABNORMAL LOW (ref >39)
LDL Cholesterol: 70 mg/dL (ref 0–99)
Total CHOL/HDL Ratio: 3.6 Ratio
Triglycerides: 86 mg/dL (ref <150)
VLDL: 17 mg/dL (ref 0–40)

## 2013-11-21 MED ORDER — FLUTICASONE PROPIONATE 50 MCG/ACT NA SUSP: 2.0000 | Freq: Every day | NASAL | Status: DC

## 2013-11-21 MED ORDER — CLOBETASOL PROPIONATE 0.05 % EX CREA: 1.0000 "application " | TOPICAL_CREAM | Freq: Two times a day (BID) | CUTANEOUS | Status: DC | PRN

## 2013-11-21 MED ORDER — HALCINONIDE 0.1 % EX CREA: 1.0000 "application " | TOPICAL_CREAM | Freq: Every day | CUTANEOUS | Status: DC | PRN

## 2013-11-21 NOTE — Progress Notes (Signed)
OFFICE NOTE  Chief Complaint:  Routine follow-up, former Rollene Fare patient  Primary Care Physician: No PCP Per Patient  HPI:  Jared White is a pleasant 68 year old male who is producing followed by Dr. Rollene Fare. He has a history of coronary artery disease and plain old balloon angioplasty to the OM in 1997. Subsequently he underwent CABG in 2001 by Dr. Prescott Gum. This included a LIMA to LAD, SVG to diagonal, sequential SVG to ramus and OM and SVG to PDA. There is a strong family history of coronary disease in several brothers who died in their 91s and 8s. Recently he had trauma falling approximately 10 or 11 feet off of a roof and sustained multiple rib fractures. He had an undiagnosed right forearm fracture and was referred to [facility] and is undergoing treatment for that. He says that it is poorly healing and he may be a candidate for a bone stimulator. Cardiac standpoint he denies any chest pain or worsening shortness of breath with exertion. He recently underwent carotid Dopplers in August of 2014 which showed very mild bilateral plaque. He also underwent an aortic Doppler in June of 2014 which did not show any aneurysm.  He is on medication for hypertension and dyslipidemia, both which were recently checked and well controlled.  He reports some leg cramps today. He also had one episode where he woke up with nausea after eating at a certain restaurant. He says he hasn't quite felt the same with reflux type symptoms in the morning since that time. Otherwise he feels like he is doing fine.  He is requesting a refill of his topical steroid for psoriasis.  PMHx:  Past Medical History  Diagnosis Date  . Broken back   . Irregular heart beat   . GERD (gastroesophageal reflux disease)   . CHF (congestive heart failure)   . Hypertension   . CAD (coronary artery disease)     PCI & CABG  . Hyperlipidemia   . Systemic hypertension   . Diverticulitis     mild - approx 2004  .  Family history of heart disease   . History of nuclear stress test 06/30/2011    lexiscan; normal pattern of perfusion post-stress; low risk scan     Past Surgical History  Procedure Laterality Date  . Lung lobectomy      left upper lobe  . Coronary artery bypass graft  01/2000    x5 - LIMA to LAD, SVG to diagonal, sequential SVG to ramus & OM, SVG to PDA (Dr. Tharon Aquas Trigt)  . Cardiac catheterization  08/1995    PTCA of OM (Dr. Marella Chimes)  . Transthoracic echocardiogram  12/24/2012    EF 55-60%, mild LVH, mild conc hypertrophy; mild MR; LA mildly dilated    FAMHx:  Family History  Problem Relation Age of Onset  . Heart attack Mother   . Cancer Father   . Kidney failure Brother     SOCHx:   reports that he has never smoked. He has never used smokeless tobacco. He reports that he drinks alcohol. He reports that he does not use illicit drugs.  ALLERGIES:  No Known Allergies  ROS: A comprehensive review of systems was negative except for: Gastrointestinal: positive for reflux symptoms  HOME MEDS: Current Outpatient Prescriptions  Medication Sig Dispense Refill  . amLODipine (NORVASC) 5 MG tablet Take 5 mg by mouth daily.      Marland Kitchen aspirin 325 MG tablet Take 325 mg by mouth daily.      Marland Kitchen  atenolol (TENORMIN) 50 MG tablet Take 1 tablet (50 mg total) by mouth daily.  90 tablet  1  . clobetasol cream (TEMOVATE) 0.09 % Apply 1 application topically 2 (two) times daily as needed.  30 g  1  . fluticasone (FLONASE) 50 MCG/ACT nasal spray Place 2 sprays into both nostrils daily.  16 g  1  . omeprazole (PRILOSEC) 20 MG capsule Take 1 capsule (20 mg total) by mouth daily.  90 capsule  3  . simvastatin (ZOCOR) 80 MG tablet Take 80 mg by mouth at bedtime.      . valsartan (DIOVAN) 160 MG tablet Take 1 tablet (160 mg total) by mouth daily.  90 tablet  1   No current facility-administered medications for this visit.    LABS/IMAGING: No results found for this or any previous visit (from the  past 48 hour(s)). No results found.  VITALS: BP 120/72  Pulse 65  Ht 5\' 9"  (1.753 m)  Wt 180 lb 12.8 oz (82.01 kg)  BMI 26.69 kg/m2  EXAM: General appearance: alert and no distress Neck: no carotid bruit and no JVD Lungs: clear to auscultation bilaterally Heart: regular rate and rhythm, S1, S2 normal, no murmur, click, rub or gallop Abdomen: soft, non-tender; bowel sounds normal; no masses,  no organomegaly Extremities: right arm is in a soft cast Pulses: 2+ and symmetric Skin: Skin color, texture, turgor normal. No rashes or lesions Neurologic: Grossly normal Psych: Pleasant, normal mood  EKG: Sinus rhythm at 65  ASSESSMENT: 1. Coronary artery disease status post four-vessel CABG in 2001 2. Hypertension 3. Dyslipidemia 4. Psoriasis  PLAN: 1.   [name] is doing well overall. He continues to have some morning reflux symptoms I recommend he take twice a day Prilosec for couple of weeks and then go back to once a day. He is requesting a refill of his topical cream for psoriasis. He's also not had laboratory work in some time. He does not have a primary care provider. I will go head and obtain screening laboratory work and I've given him a referral to Dr. Velna Hatchet. Plan to see him back in 6 months. Pixie Casino, MD, [facility] Attending Cardiologist [facility]  Emanuel Campos C 11/21/2013, 3:43 PM

## 2013-11-21 NOTE — Patient Instructions (Signed)
Your physician recommends that you return for lab work in: fasting (also will have a urine sample) - lipid, cmp, cbc, magnesium, psa, urinalysis, urine microalbumin with creatine ratione  Your physician wants you to follow-up in: 6 months. You will receive a reminder letter in the mail two months in advance. If you don't receive a letter, please call our office to schedule the follow-up appointment.

## 2013-11-22 LAB — MICROALBUMIN / CREATININE URINE RATIO
Creatinine, Urine: 159.4 mg/dL
Microalb Creat Ratio: 773.9 mg/g — ABNORMAL HIGH (ref 0.0–30.0)
Microalb, Ur: 123.36 mg/dL — ABNORMAL HIGH (ref 0.00–1.89)

## 2013-11-22 LAB — URINALYSIS
Bilirubin Urine: NEGATIVE
Glucose, UA: NEGATIVE mg/dL
Ketones, ur: NEGATIVE mg/dL
Leukocytes, UA: NEGATIVE
Nitrite: NEGATIVE
Protein, ur: 100 mg/dL — AB
Specific Gravity, Urine: 1.019 (ref 1.005–1.030)
Urobilinogen, UA: 1 mg/dL (ref 0.0–1.0)
pH: 6.5 (ref 5.0–8.0)

## 2013-11-22 LAB — PSA: PSA: 1.66 ng/mL (ref <=4.00)

## 2013-11-25 ENCOUNTER — Telehealth: Payer: Self-pay | Admitting: *Deleted

## 2013-11-25 ENCOUNTER — Encounter: Payer: Self-pay | Admitting: *Deleted

## 2013-11-25 DIAGNOSIS — Z79899 Other long term (current) drug therapy: Secondary | ICD-10-CM

## 2013-11-25 MED ORDER — POTASSIUM CHLORIDE CRYS ER 20 MEQ PO TBCR: 20.0000 meq | EXTENDED_RELEASE_TABLET | Freq: Every day | ORAL | Status: DC

## 2013-11-25 MED ORDER — MAGNESIUM OXIDE 400 MG PO CAPS: 400.0000 mg | ORAL_CAPSULE | ORAL | Status: DC

## 2013-11-25 NOTE — Telephone Encounter (Signed)
Message copied by Fidel Levy on Mon Nov 25, 2013  5:04 PM ------      Message from: Pixie Casino      Created: Sat Nov 23, 2013  9:35 PM      Regarding: FW:       Labs show he has low magnesium and low normal potassium. He may be losing it through the kidney, there is a good amount of protein in the urine. He should start magnesium oxide 400 mg BID for 1 week, then 400 mg daily. He should also start taking potassium 20 MEQ daily. Would recheck BMP and magnesium in 2 weeks. This should help his cramps.            Dr. Lemmie Evens      ----- Message -----         From: [facility] in [facility]         Sent: 11/22/2013   1:54 AM           To: Pixie Casino, MD       ------

## 2013-11-25 NOTE — Telephone Encounter (Signed)
Medications ordered (magnesium and potassium) Repeat labs ordered.   Patient aware.

## 2013-12-20 ENCOUNTER — Other Ambulatory Visit: Payer: Self-pay | Admitting: *Deleted

## 2013-12-20 MED ORDER — AMLODIPINE BESYLATE 5 MG PO TABS: 5.0000 mg | ORAL_TABLET | Freq: Every day | ORAL | Status: DC

## 2014-01-17 ENCOUNTER — Other Ambulatory Visit: Payer: Self-pay | Admitting: Internal Medicine

## 2014-01-17 ENCOUNTER — Other Ambulatory Visit: Payer: Self-pay | Admitting: *Deleted

## 2014-01-17 MED ORDER — SIMVASTATIN 80 MG PO TABS: 80.0000 mg | ORAL_TABLET | Freq: Every day | ORAL | Status: DC

## 2014-01-17 NOTE — Telephone Encounter (Signed)
Rx was sent to pharmacy electronically. 

## 2014-01-17 NOTE — Telephone Encounter (Signed)
Rx refill sent to patient pharmacy   

## 2014-04-23 ENCOUNTER — Other Ambulatory Visit: Payer: Self-pay | Admitting: Internal Medicine

## 2014-04-30 ENCOUNTER — Other Ambulatory Visit: Payer: Self-pay | Admitting: Internal Medicine

## 2014-05-13 ENCOUNTER — Ambulatory Visit: Payer: Medicare Other | Admitting: Internal Medicine

## 2014-08-13 ENCOUNTER — Other Ambulatory Visit: Payer: Self-pay | Admitting: Internal Medicine

## 2014-08-13 NOTE — Telephone Encounter (Signed)
Rx(s) sent to pharmacy electronically.  

## 2014-10-08 ENCOUNTER — Other Ambulatory Visit: Payer: Self-pay | Admitting: Internal Medicine

## 2014-10-08 NOTE — Telephone Encounter (Signed)
Rx(s) sent to pharmacy electronically.  

## 2014-10-16 ENCOUNTER — Other Ambulatory Visit: Payer: Self-pay | Admitting: Internal Medicine

## 2014-11-17 ENCOUNTER — Other Ambulatory Visit: Payer: Self-pay | Admitting: Internal Medicine

## 2015-01-10 ENCOUNTER — Other Ambulatory Visit: Payer: Self-pay | Admitting: Internal Medicine

## 2015-01-12 NOTE — Telephone Encounter (Signed)
Rx(s) sent to pharmacy electronically.  

## 2015-03-16 ENCOUNTER — Other Ambulatory Visit: Payer: Self-pay | Admitting: Internal Medicine

## 2015-03-17 NOTE — Telephone Encounter (Signed)
Please advise on refill. Thanks, MI 

## 2015-04-09 ENCOUNTER — Other Ambulatory Visit: Payer: Self-pay | Admitting: Internal Medicine

## 2015-04-22 ENCOUNTER — Other Ambulatory Visit: Payer: Self-pay | Admitting: Internal Medicine

## 2015-05-07 ENCOUNTER — Other Ambulatory Visit: Payer: Self-pay | Admitting: Internal Medicine

## 2015-05-07 NOTE — Telephone Encounter (Signed)
CALLED PATIENT TO SCHEDULE AN APPT SINCE LAST VISIT WAS 11/2013. PT REFUSED APPT. MED REFILLS DENIED.

## 2015-10-03 ENCOUNTER — Ambulatory Visit (INDEPENDENT_AMBULATORY_CARE_PROVIDER_SITE_OTHER): Payer: Medicare Other | Admitting: Internal Medicine

## 2015-10-03 VITALS — BP 140/84 | HR 70 | Temp 98.5°F | Resp 16 | Ht 69.0 in | Wt 181.0 lb

## 2015-10-03 DIAGNOSIS — E785 Hyperlipidemia, unspecified: Secondary | ICD-10-CM | POA: Diagnosis not present

## 2015-10-03 DIAGNOSIS — I2584 Coronary atherosclerosis due to calcified coronary lesion: Secondary | ICD-10-CM

## 2015-10-03 DIAGNOSIS — M79602 Pain in left arm: Secondary | ICD-10-CM

## 2015-10-03 DIAGNOSIS — I10 Essential (primary) hypertension: Secondary | ICD-10-CM | POA: Diagnosis not present

## 2015-10-03 DIAGNOSIS — R209 Unspecified disturbances of skin sensation: Secondary | ICD-10-CM

## 2015-10-03 DIAGNOSIS — M79601 Pain in right arm: Secondary | ICD-10-CM

## 2015-10-03 DIAGNOSIS — D234 Other benign neoplasm of skin of scalp and neck: Secondary | ICD-10-CM | POA: Diagnosis not present

## 2015-10-03 DIAGNOSIS — R202 Paresthesia of skin: Secondary | ICD-10-CM

## 2015-10-03 DIAGNOSIS — D224 Melanocytic nevi of scalp and neck: Secondary | ICD-10-CM

## 2015-10-03 DIAGNOSIS — M79603 Pain in arm, unspecified: Secondary | ICD-10-CM

## 2015-10-03 DIAGNOSIS — I2510 Atherosclerotic heart disease of native coronary artery without angina pectoris: Secondary | ICD-10-CM | POA: Diagnosis not present

## 2015-10-03 LAB — POCT CBC
Granulocyte percent: 72.6 %G (ref 37–80)
HCT, POC: 38.3 % — AB (ref 43.5–53.7)
Hemoglobin: 13.2 g/dL — AB (ref 14.1–18.1)
Lymph, poc: 1.9 (ref 0.6–3.4)
MCH, POC: 31.1 pg (ref 27–31.2)
MCHC: 34.5 g/dL (ref 31.8–35.4)
MCV: 90 fL (ref 80–97)
MID (cbc): 0.7 (ref 0–0.9)
MPV: 7.5 fL (ref 0–99.8)
POC Granulocyte: 6.9 (ref 2–6.9)
POC LYMPH PERCENT: 20.2 %L (ref 10–50)
POC MID %: 7.2 %M (ref 0–12)
Platelet Count, POC: 169 10*3/uL (ref 142–424)
RBC: 4.25 M/uL — AB (ref 4.69–6.13)
RDW, POC: 13.4 %
WBC: 9.5 10*3/uL (ref 4.6–10.2)

## 2015-10-03 LAB — GLUCOSE, POCT (MANUAL RESULT ENTRY): POC Glucose: 107 mg/dl — AB (ref 70–99)

## 2015-10-03 NOTE — Progress Notes (Addendum)
By signing my name below I, Tereasa Coop, attest that this documentation has been prepared under the direction and in the presence of Tami Lin, MD. Electonically Signed. Tereasa Coop, Scribe 10/03/2015 at 4:23 PM   Subjective:    Patient ID: Jared White, male    DOB: [birth year], 70 y.o.   MRN: [id number]  Chief Complaint  Patient presents with  . Numbness    x 1 day/ both arms  . tingling in face    x 1 day  . Dizziness    x 1day    HPI Jared White is a 69 y.o. male who presents to the [facility] complaining of bilat arm numbness since yesterday. Pt also c/o facial tingling and dizziness for the past day. Pt's facial tingling started when he was doing yard-work yesterday. Pt states that any movement of his neck affects the numbness in his arms. Pt reports having a pinched nerve in his neck. Pt denies any treatment for his chronic neck pain. Pt has been developing worsening episodes of bilat hand weakness for the past 20 years.  Pt denies any chest tightness, tachycardia, or SOB.  Pt also c/o sore spot on the top of his head that he noticed today.   Pt states he has not eaten anything since breakfast.      Patient Active Problem List   Diagnosis Date Noted  . Psoriasis 11/21/2013  . S/P CABG x 4 03/14/2013  . Multiple thoracic/lumbar transverse process fractures 12/15/2012  . Fall from roof 12/15/2012  . CAD (coronary artery disease) 12/15/2012  . GERD (gastroesophageal reflux disease) 12/15/2012  . HTN (hypertension) 12/15/2012  . Hyperlipidemia 12/15/2012  . Multiple bilateral rib fractures     Current outpatient prescriptions:  .  amLODipine (NORVASC) 5 MG tablet, TAKE ONE TABLET BY MOUTH ONE TIME DAILY, Disp: 30 tablet, Rfl: 6 .  aspirin 325 MG tablet, Take 325 mg by mouth daily., Disp: , Rfl:  .  atenolol (TENORMIN) 50 MG tablet, TAKE ONE TABLET BY MOUTH ONE TIME DAILY, Disp: 30 tablet, Rfl: 0 .  clobetasol cream (TEMOVATE) [id number] %,  Apply 1 application topically 2 (two) times daily as needed., Disp: 30 g, Rfl: 1 .  fluticasone (FLONASE) 50 MCG/ACT nasal spray, USE TWO SPRAYS IN EACH NOSTRIL DAILY , Disp: 16 g, Rfl: 0 .  Magnesium Oxide 400 MG CAPS, Take 1 capsule (400 mg total) by mouth as directed. Take 1 tablet twice daily for 1 week then take once daily., Disp: 40 capsule, Rfl: 3 .  omeprazole (PRILOSEC) 20 MG capsule, TAKE ONE CAPSULE BY MOUTH ONE TIME DAILY, Disp: 90 capsule, Rfl: 0 .  potassium chloride SA (K-DUR,KLOR-CON) 20 MEQ tablet, Take 1 tablet (20 mEq total) by mouth daily., Disp: 30 tablet, Rfl: 6 .  simvastatin (ZOCOR) 80 MG tablet, TAKE 1 TABLET BY MOUTH AT BEDTIME., Disp: 30 tablet, Rfl: 0 .  valsartan (DIOVAN) 160 MG tablet, TAKE 1 TABLET BY MOUTH ONE TIME DAILY., Disp: 90 tablet, Rfl: 0  No Known Allergies  PMH  Jared White is a pleasant 70 year old male who was followed by Dr. Rollene Fare. He has a history of coronary artery disease and plain old balloon angioplasty to the OM in 1997. Subsequently he underwent CABG in 2001 by Dr. Prescott Gum. This included a LIMA to LAD, SVG to diagonal, sequential SVG to ramus and OM and SVG to PDA. There is a strong family history of coronary disease in several brothers  who died in their 62s and 82s. Recently he had trauma falling approximately 10 or 11 feet off of a roof and sustained multiple rib fractures. He had an undiagnosed right forearm fracture and was referred to [facility] and is undergoing treatment for that. He says that it is poorly healing and he may be a candidate for a bone stimulator. Cardiac standpoint he denies any chest pain or worsening shortness of breath with exertion. He recently underwent carotid Dopplers in August of 2014 which showed very mild bilateral plaque. He also underwent an aortic Doppler in June of 2014 which did not show any aneurysm. He is on medication for hypertension and dyslipidemia, both which were recently checked and well  controlled.  Ref to Dr Ardeth Perfect 2015 for PCP but never had this established Review of Systems  Respiratory: Negative for shortness of breath.   Cardiovascular: Negative for chest pain and palpitations.  Musculoskeletal: Positive for neck pain (chronic).  Neurological: Positive for dizziness and numbness (bilat arms).       Objective:   Physical Exam  Constitutional: He is oriented to person, place, and time. He appears well-developed and well-nourished. No distress.  HENT:  Head: Normocephalic and atraumatic.  Eyes: Conjunctivae and EOM are normal. Pupils are equal, round, and reactive to light.  Neck:  Any ROM of neck creates discomfort that radiates bilat.   Cardiovascular: Normal rate, regular rhythm, normal heart sounds and intact distal pulses.   No murmur heard. Pulmonary/Chest: Effort normal. No respiratory distress.  Abdominal: He exhibits no distension.  Musculoskeletal: Normal range of motion. He exhibits no edema.  Upper extremities lack FTO 1-5 on the left. There is no asymmetry of grip which is not weak and no distinct sensory losses.   Neurological: He is alert and oriented to person, place, and time. He has normal reflexes. No cranial nerve deficit. He exhibits normal muscle tone. Coordination normal.  Skin: Skin is warm and dry.  Irritated nevus on scalp with 2 colors of pigmentation.   Psychiatric: He has a normal mood and affect. His behavior is normal. Judgment and thought content normal.  Nursing note and vitals reviewed.    Filed Vitals:   10/03/15 1430  BP: 140/84  Pulse: 70  Temp: 98.5 F (36.9 C)  TempSrc: Oral  Resp: 16  Height: 5\' 9"  (1.753 m)  Weight: 181 lb (82.101 kg)  SpO2: 98%   Results for orders placed or performed in visit on 10/03/15  POCT CBC  Result Value Ref Range   WBC 9.5 4.6 - 10.2 K/uL   Lymph, poc 1.9 0.6 - 3.4   POC LYMPH PERCENT 20.2 10 - 50 %L   MID (cbc) 0.7 0 - 0.9   POC MID % 7.2 0 - 12 %M   POC Granulocyte 6.9 2 -  6.9   Granulocyte percent 72.6 37 - 80 %G   RBC 4.25 (A) 4.69 - 6.13 M/uL   Hemoglobin 13.2 (A) 14.1 - 18.1 g/dL   HCT, POC 38.3 (A) 43.5 - 53.7 %   MCV 90.0 80 - 97 fL   MCH, POC 31.1 27 - 31.2 pg   MCHC 34.5 31.8 - 35.4 g/dL   RDW, POC 13.4 %   Platelet Count, POC 169 142 - 424 K/uL   MPV 7.5 0 - 99.8 fL  POCT glucose (manual entry)  Result Value Ref Range   POC Glucose 107 (A) 70 - 99 mg/dl        Assessment & Plan:  Atypical nevus of scalp ---refer derm  Essential hypertension - Plan: Comprehensive metabolic panel  Hyperlipidemia - Plan: Lipid panel  Coronary artery disease due to calcified coronary lesion  Paresthesia and pain of both upper extremities - Plan: POCT CBC, POCT glucose (manual entry), Comprehensive metabolic panel  Facial paresthesia - Plan:reck if doesn't resolve 5-7d Gave motor sign of strk for immed f/u  Needs PCP to be established  I have completed the patient encounter in its entirety as documented by the scribe, with editing by me where necessary. Adonijah Baena P. Laney Pastor, M.D.

## 2015-10-03 NOTE — Progress Notes (Addendum)
   Subjective:    Patient ID: Jared White, male    DOB: January 04, 1946, 70 y.o.   MRN: [id number]  HPI HPI:    Review of Systems     Objective:   Physical Exam        Assessment & Plan:

## 2015-10-03 NOTE — Patient Instructions (Signed)
     IF you received an x-ray today, you will receive an invoice from [facility]. Please contact [facility] at [phone] with questions or concerns regarding your invoice.   IF you received labwork today, you will receive an invoice from [facility]. Please contact [facility] at [phone] with questions or concerns regarding your invoice.   Our billing staff will not be able to assist you with questions regarding bills from these companies.  You will be contacted with the lab results as soon as they are available. The fastest way to get your results is to activate your My Chart account. Instructions are located on the last page of this paperwork. If you have not heard from us regarding the results in 2 weeks, please contact this office.      

## 2015-10-05 ENCOUNTER — Encounter: Payer: Self-pay | Admitting: Internal Medicine

## 2015-10-05 ENCOUNTER — Telehealth: Payer: Self-pay

## 2015-10-05 LAB — LIPID PANEL
Cholesterol: 146 mg/dL (ref 125–200)
HDL: 29 mg/dL — ABNORMAL LOW (ref >=40)
LDL Cholesterol: 85 mg/dL (ref <130)
Total CHOL/HDL Ratio: 5 Ratio (ref <=5.0)
Triglycerides: 162 mg/dL — ABNORMAL HIGH (ref <150)
VLDL: 32 mg/dL — ABNORMAL HIGH (ref <30)

## 2015-10-05 LAB — COMPREHENSIVE METABOLIC PANEL
ALT: 13 U/L (ref 9–46)
AST: 17 U/L (ref 10–35)
Albumin: 3.5 g/dL — ABNORMAL LOW (ref 3.6–5.1)
Alkaline Phosphatase: 67 U/L (ref 40–115)
BUN: 19 mg/dL (ref 7–25)
CO2: 25 mmol/L (ref 20–31)
Calcium: 6.4 mg/dL — ABNORMAL LOW (ref 8.6–10.3)
Chloride: 108 mmol/L (ref 98–110)
Creat: 1.05 mg/dL (ref 0.70–1.25)
Glucose, Bld: 102 mg/dL — ABNORMAL HIGH (ref 65–99)
Potassium: 3.8 mmol/L (ref 3.5–5.3)
Sodium: 145 mmol/L (ref 135–146)
Total Bilirubin: 0.4 mg/dL (ref 0.2–1.2)
Total Protein: 6.2 g/dL (ref 6.1–8.1)

## 2015-10-05 NOTE — Telephone Encounter (Signed)
Sent in

## 2015-10-05 NOTE — Addendum Note (Signed)
Addended by: Leandrew Koyanagi on: 10/05/2015 08:31 PM   Modules accepted: Orders

## 2015-10-05 NOTE — Telephone Encounter (Signed)
Patient was seen Saturday by you Dr. Laney Pastor and was told to follow up with a neurologist. Per pt it has been years since he has seen one and would need a referral. Could you please place referral in chart.  Thanks   [employer]

## 2015-10-05 NOTE — Telephone Encounter (Signed)
Pt was seen Saturday for neurology issues, and was told to follow up with neurologist but since it has been several years that he was seen he needs a referral from [place]   Best number [phone]

## 2015-10-05 NOTE — Telephone Encounter (Signed)
A referral will need to be put in the system before we can send his information to the specialist.

## 2015-10-06 ENCOUNTER — Other Ambulatory Visit: Payer: Self-pay

## 2015-10-07 ENCOUNTER — Other Ambulatory Visit (INDEPENDENT_AMBULATORY_CARE_PROVIDER_SITE_OTHER): Payer: Medicare Other

## 2015-10-07 LAB — CALCIUM: Calcium: 6.5 mg/dL — ABNORMAL LOW (ref 8.6–10.3)

## 2015-10-07 LAB — PHOSPHORUS: Phosphorus: 2.8 mg/dL (ref 2.1–4.3)

## 2015-10-07 LAB — MAGNESIUM: Magnesium: 0.5 mg/dL — ABNORMAL LOW (ref 1.5–2.5)

## 2015-10-08 ENCOUNTER — Encounter (HOSPITAL_COMMUNITY): Payer: Self-pay | Admitting: *Deleted

## 2015-10-08 ENCOUNTER — Telehealth: Payer: Self-pay

## 2015-10-08 ENCOUNTER — Inpatient Hospital Stay (HOSPITAL_COMMUNITY)
Admission: EM | Admit: 2015-10-08 | Discharge: 2015-10-09 | DRG: 641 | Disposition: A | Discharge status: Home / Self Care | Payer: Medicare Other | Attending: Internal Medicine | Admitting: Internal Medicine

## 2015-10-08 DIAGNOSIS — Z8249 Family history of ischemic heart disease and other diseases of the circulatory system: Secondary | ICD-10-CM

## 2015-10-08 DIAGNOSIS — I509 Heart failure, unspecified: Secondary | ICD-10-CM | POA: Diagnosis present

## 2015-10-08 DIAGNOSIS — E785 Hyperlipidemia, unspecified: Secondary | ICD-10-CM | POA: Diagnosis present

## 2015-10-08 DIAGNOSIS — Z7951 Long term (current) use of inhaled steroids: Secondary | ICD-10-CM | POA: Diagnosis not present

## 2015-10-08 DIAGNOSIS — Z809 Family history of malignant neoplasm, unspecified: Secondary | ICD-10-CM

## 2015-10-08 DIAGNOSIS — R200 Anesthesia of skin: Secondary | ICD-10-CM | POA: Diagnosis present

## 2015-10-08 DIAGNOSIS — R208 Other disturbances of skin sensation: Secondary | ICD-10-CM | POA: Diagnosis not present

## 2015-10-08 DIAGNOSIS — Z79899 Other long term (current) drug therapy: Secondary | ICD-10-CM

## 2015-10-08 DIAGNOSIS — Z841 Family history of disorders of kidney and ureter: Secondary | ICD-10-CM

## 2015-10-08 DIAGNOSIS — Z7982 Long term (current) use of aspirin: Secondary | ICD-10-CM

## 2015-10-08 DIAGNOSIS — I2510 Atherosclerotic heart disease of native coronary artery without angina pectoris: Secondary | ICD-10-CM | POA: Diagnosis present

## 2015-10-08 DIAGNOSIS — Z951 Presence of aortocoronary bypass graft: Secondary | ICD-10-CM

## 2015-10-08 DIAGNOSIS — K219 Gastro-esophageal reflux disease without esophagitis: Secondary | ICD-10-CM | POA: Diagnosis present

## 2015-10-08 DIAGNOSIS — I10 Essential (primary) hypertension: Secondary | ICD-10-CM | POA: Diagnosis present

## 2015-10-08 DIAGNOSIS — I110 Hypertensive heart disease with heart failure: Secondary | ICD-10-CM | POA: Diagnosis present

## 2015-10-08 LAB — HEPATIC FUNCTION PANEL
ALT: 15 U/L — ABNORMAL LOW (ref 17–63)
AST: 19 U/L (ref 15–41)
Albumin: 3.2 g/dL — ABNORMAL LOW (ref 3.5–5.0)
Alkaline Phosphatase: 62 U/L (ref 38–126)
Bilirubin, Direct: <0.1 mg/dL — ABNORMAL LOW (ref 0.1–0.5)
Total Bilirubin: 0.3 mg/dL (ref 0.3–1.2)
Total Protein: 6.1 g/dL — ABNORMAL LOW (ref 6.5–8.1)

## 2015-10-08 LAB — CBC
HCT: 36 % — ABNORMAL LOW (ref 39.0–52.0)
Hemoglobin: 12.5 g/dL — ABNORMAL LOW (ref 13.0–17.0)
MCH: 30.3 pg (ref 26.0–34.0)
MCHC: 34.7 g/dL (ref 30.0–36.0)
MCV: 87.2 fL (ref 78.0–100.0)
Platelets: 159 10*3/uL (ref 150–400)
RBC: 4.13 MIL/uL — ABNORMAL LOW (ref 4.22–5.81)
RDW: 13.3 % (ref 11.5–15.5)
WBC: 8.5 10*3/uL (ref 4.0–10.5)

## 2015-10-08 LAB — PHOSPHORUS: Phosphorus: 3.2 mg/dL (ref 2.5–4.6)

## 2015-10-08 LAB — BASIC METABOLIC PANEL
Anion gap: 7 (ref 5–15)
BUN: 18 mg/dL (ref 6–20)
CO2: 26 mmol/L (ref 22–32)
Calcium: 6.4 mg/dL — CL (ref 8.9–10.3)
Chloride: 108 mmol/L (ref 101–111)
Creatinine, Ser: 1.04 mg/dL (ref 0.61–1.24)
GFR calc Af Amer: >60 mL/min (ref >60)
GFR calc non Af Amer: >60 mL/min (ref >60)
Glucose, Bld: 131 mg/dL — ABNORMAL HIGH (ref 65–99)
Potassium: 3.2 mmol/L — ABNORMAL LOW (ref 3.5–5.1)
Sodium: 141 mmol/L (ref 135–145)

## 2015-10-08 LAB — I-STAT TROPONIN, ED: Troponin i, poc: 0.01 ng/mL (ref 0.00–0.08)

## 2015-10-08 LAB — PARATHYROID HORMONE, INTACT (NO CA): PTH: 29 pg/mL (ref 14–64)

## 2015-10-08 LAB — MAGNESIUM: Magnesium: 0.5 mg/dL — CL (ref 1.7–2.4)

## 2015-10-08 MED ORDER — MAGNESIUM SULFATE 2 GM/50ML IV SOLN
2.0000 g | Freq: Once | INTRAVENOUS | Status: AC
  Administered 2015-10-08: 2 g via INTRAVENOUS
  Filled 2015-10-08: qty 50

## 2015-10-08 MED ORDER — POTASSIUM CHLORIDE CRYS ER 20 MEQ PO TBCR
20.0000 meq | EXTENDED_RELEASE_TABLET | Freq: Every day | ORAL | Status: DC
  Administered 2015-10-08: 20 meq via ORAL
  Filled 2015-10-08: qty 1

## 2015-10-08 MED ORDER — ACETAMINOPHEN 650 MG RE SUPP: 650.0000 mg | Freq: Four times a day (QID) | RECTAL | Status: DC | PRN

## 2015-10-08 MED ORDER — CALCITRIOL 0.25 MCG PO CAPS
0.2500 ug | ORAL_CAPSULE | Freq: Two times a day (BID) | ORAL | Status: DC
  Administered 2015-10-08: 0.25 ug via ORAL
  Filled 2015-10-08: qty 1

## 2015-10-08 MED ORDER — ATENOLOL 50 MG PO TABS
50.0000 mg | ORAL_TABLET | Freq: Every day | ORAL | Status: DC
  Administered 2015-10-09: 50 mg via ORAL
  Filled 2015-10-08: qty 1

## 2015-10-08 MED ORDER — ONDANSETRON HCL 4 MG PO TABS: 4.0000 mg | ORAL_TABLET | Freq: Four times a day (QID) | ORAL | Status: DC | PRN

## 2015-10-08 MED ORDER — ATORVASTATIN CALCIUM 40 MG PO TABS: 40.0000 mg | ORAL_TABLET | Freq: Every day | ORAL | Status: DC

## 2015-10-08 MED ORDER — ENOXAPARIN SODIUM 40 MG/0.4ML ~~LOC~~ SOLN
40.0000 mg | SUBCUTANEOUS | Status: DC
  Administered 2015-10-08: 40 mg via SUBCUTANEOUS
  Filled 2015-10-08: qty 0.4

## 2015-10-08 MED ORDER — CALCIUM CARBONATE ANTACID 500 MG PO CHEW
1.0000 | CHEWABLE_TABLET | Freq: Three times a day (TID) | ORAL | Status: DC
  Administered 2015-10-08 – 2015-10-09 (×3): 200 mg via ORAL
  Filled 2015-10-08 (×3): qty 1

## 2015-10-08 MED ORDER — PANTOPRAZOLE SODIUM 40 MG PO TBEC
40.0000 mg | DELAYED_RELEASE_TABLET | Freq: Every day | ORAL | Status: DC
  Administered 2015-10-09: 40 mg via ORAL
  Filled 2015-10-08: qty 1

## 2015-10-08 MED ORDER — SODIUM CHLORIDE 0.9 % IV SOLN
1.0000 g | Freq: Once | INTRAVENOUS | Status: AC
  Administered 2015-10-08: 1 g via INTRAVENOUS
  Filled 2015-10-08: qty 10

## 2015-10-08 MED ORDER — ASPIRIN 325 MG PO TABS
325.0000 mg | ORAL_TABLET | Freq: Every day | ORAL | Status: DC
  Administered 2015-10-09: 325 mg via ORAL
  Filled 2015-10-08: qty 1

## 2015-10-08 MED ORDER — VITAMIN D (ERGOCALCIFEROL) 1.25 MG (50000 UNIT) PO CAPS
50000.0000 [IU] | ORAL_CAPSULE | ORAL | Status: DC
  Administered 2015-10-08: 50000 [IU] via ORAL
  Filled 2015-10-08: qty 1

## 2015-10-08 MED ORDER — MAGNESIUM OXIDE 400 (241.3 MG) MG PO TABS
400.0000 mg | ORAL_TABLET | Freq: Two times a day (BID) | ORAL | Status: DC
  Administered 2015-10-08 – 2015-10-09 (×2): 400 mg via ORAL
  Filled 2015-10-08 (×2): qty 1

## 2015-10-08 MED ORDER — ONDANSETRON HCL 4 MG/2ML IJ SOLN: 4.0000 mg | Freq: Four times a day (QID) | INTRAMUSCULAR | Status: DC | PRN

## 2015-10-08 MED ORDER — AMLODIPINE BESYLATE 5 MG PO TABS
5.0000 mg | ORAL_TABLET | Freq: Every day | ORAL | Status: DC
  Administered 2015-10-09: 5 mg via ORAL
  Filled 2015-10-08: qty 1

## 2015-10-08 MED ORDER — MAGNESIUM OXIDE 400 MG PO CAPS: 400.0000 mg | ORAL_CAPSULE | Freq: Two times a day (BID) | ORAL | Status: DC

## 2015-10-08 MED ORDER — ACETAMINOPHEN 325 MG PO TABS: 650.0000 mg | ORAL_TABLET | Freq: Four times a day (QID) | ORAL | Status: DC | PRN

## 2015-10-08 MED ORDER — FLUTICASONE PROPIONATE 50 MCG/ACT NA SUSP
2.0000 | Freq: Every day | NASAL | Status: DC
  Filled 2015-10-08: qty 16

## 2015-10-08 NOTE — Telephone Encounter (Signed)
[facility] called regarding critical value on Magnesium and Calcium. Please review and advise. Report given to Dr. Carlota Raspberry in Dr. Michelle Nasuti absence.

## 2015-10-08 NOTE — Telephone Encounter (Signed)
Pt says he is having continued dizziness and facial paresthesias. Advised pt to go to ER. Pt will go to [facility] this morning.

## 2015-10-08 NOTE — ED Provider Notes (Signed)
CSN: [id number]     Arrival date & time 10/08/15  [id number] History   First MD Initiated Contact with Patient 10/08/15 1053     Chief Complaint  Patient presents with  . Abnormal Lab  . Mg 0.5   . Ca 6.5      (Consider location/radiation/quality/duration/timing/severity/associated sxs/prior Treatment) HPI  Numbness across face and across top of his head, went to urgent care, found to be low on calcium and magnesium. Numbness started about 1 wk ago, seemed sudden, both sides of face, worse on left side, has been waxing and waning since then.  Not feeling symptoms right now.  No difficulty talking/walking/new numbness/weakness in arms or legs. Maybe a little "woozy" lightheaded with blurred vision.  Has had nerve problems with neck an dassociated numbness.  After checking the bloodwork x2, urgent care doctor sent here fo revaluation.  No history of problems with calcium. Magnesium low 1 yr ago, took pills. First time blood checked in the last year.  Used to take mg but has not taken in a long time.   No fevers, chest pain, shortness of breath.  No headache. +cramping in the legs.    Past Medical History  Diagnosis Date  . Broken back   . Irregular heart beat   . GERD (gastroesophageal reflux disease)   . CHF (congestive heart failure) ([place])   . Hypertension   . CAD (coronary artery disease)     PCI & CABG  . Hyperlipidemia   . Systemic hypertension   . Diverticulitis     mild - approx 2004  . Family history of heart disease   . History of nuclear stress test 06/30/2011    lexiscan; normal pattern of perfusion post-stress; low risk scan    Past Surgical History  Procedure Laterality Date  . Lung lobectomy      left upper lobe  . Coronary artery bypass graft  01/2000    x5 - LIMA to LAD, SVG to diagonal, sequential SVG to ramus & OM, SVG to PDA (Dr. Tharon Aquas Trigt)  . Cardiac catheterization  08/1995    PTCA of OM (Dr. Marella Chimes)  . Transthoracic echocardiogram  12/24/2012    EF  55-60%, mild LVH, mild conc hypertrophy; mild MR; LA mildly dilated   Family History  Problem Relation Age of Onset  . Heart attack Mother   . Cancer Father   . Kidney failure Brother    Social History  Substance Use Topics  . Smoking status: Never Smoker   . Smokeless tobacco: Never Used  . Alcohol Use: Yes     Comment: rarely    Review of Systems  Constitutional: Negative for fever.  HENT: Negative for sore throat.   Eyes: Negative for visual disturbance.  Respiratory: Negative for shortness of breath.   Cardiovascular: Negative for chest pain.  Gastrointestinal: Negative for nausea, vomiting, abdominal pain and diarrhea.  Genitourinary: Negative for difficulty urinating.  Musculoskeletal: Negative for back pain and neck stiffness.  Skin: Negative for rash.  Neurological: Positive for light-headedness and numbness. Negative for dizziness, syncope, facial asymmetry, speech difficulty, weakness and headaches.      Allergies  Review of patient's allergies indicates no known allergies.  Home Medications   Prior to Admission medications   Medication Sig Start Date End Date Taking? Authorizing Provider  amLODipine (NORVASC) 5 MG tablet TAKE ONE TABLET BY MOUTH ONE TIME DAILY 01/12/15  Yes Pixie Casino, MD  aspirin 325 MG tablet Take 325 mg by  mouth daily.   Yes Historical Provider, MD  atenolol (TENORMIN) 50 MG tablet TAKE ONE TABLET BY MOUTH ONE TIME DAILY 04/10/15  Yes Pixie Casino, MD  clobetasol cream (TEMOVATE) [id number] % Apply 1 application topically 2 (two) times daily as needed. Patient taking differently: Apply 1 application topically 2 (two) times daily as needed (for rash).  11/21/13  Yes Pixie Casino, MD  fluticasone (FLONASE) 50 MCG/ACT nasal spray USE TWO SPRAYS IN [facility] NOSTRIL DAILY  04/24/14  Yes Pixie Casino, MD  omeprazole (PRILOSEC) 20 MG capsule TAKE ONE CAPSULE BY MOUTH ONE TIME DAILY 11/17/14  Yes Pixie Casino, MD  simvastatin (ZOCOR) 80 MG tablet  TAKE 1 TABLET BY MOUTH AT BEDTIME. Patient taking differently: TAKE 1 TABLET BY MOUTH AT daily 04/10/15  Yes Pixie Casino, MD  valsartan (DIOVAN) 160 MG tablet TAKE 1 TABLET BY MOUTH ONE TIME DAILY. 11/17/14  Yes Pixie Casino, MD  calcium carbonate (TUMS - DOSED IN MG ELEMENTAL CALCIUM) 500 MG chewable tablet Chew 1 tablet (200 mg of elemental calcium total) by mouth 3 (three) times daily with meals. 10/09/15   Domenic Polite, MD  Magnesium Oxide 400 MG CAPS Take 1 capsule (400 mg total) by mouth 2 (two) times daily. 10/09/15   Domenic Polite, MD  potassium chloride SA (K-DUR,KLOR-CON) 20 MEQ tablet Take 2 tablets (40 mEq total) by mouth daily. 10/09/15   Domenic Polite, MD   BP 175/91 mmHg  Pulse 85  Temp(Src) 98.4 F (36.9 C) (Oral)  Resp 16  Ht 5\' 9"  (1.753 m)  Wt 180 lb 1.6 oz (81.693 kg)  BMI 26.58 kg/m2  SpO2 98% Physical Exam  Constitutional: He is oriented to person, place, and time. He appears well-developed and well-nourished. No distress.  HENT:  Head: Normocephalic and atraumatic.  Eyes: Conjunctivae and EOM are normal.  Neck: Normal range of motion.  Cardiovascular: Normal rate, regular rhythm, normal heart sounds and intact distal pulses.  Exam reveals no gallop and no friction rub.   No murmur heard. Pulmonary/Chest: Effort normal and breath sounds normal. No respiratory distress. He has no wheezes. He has no rales.  Abdominal: Soft. He exhibits no distension. There is no tenderness. There is no guarding.  Musculoskeletal: He exhibits no edema.  Neurological: He is alert and oriented to person, place, and time.  Skin: Skin is warm and dry. He is not diaphoretic.  Nursing note and vitals reviewed.   ED Course  Procedures (including critical care time) Labs Review Labs Reviewed  BASIC METABOLIC PANEL - Abnormal; Notable for the following:    Potassium 3.2 (*)    Glucose, Bld 131 (*)    Calcium 6.4 (*)    All other components within normal limits  CBC -  Abnormal; Notable for the following:    RBC 4.13 (*)    Hemoglobin 12.5 (*)    HCT 36.0 (*)    All other components within normal limits  MAGNESIUM - Abnormal; Notable for the following:    Magnesium 0.5 (*)    All other components within normal limits  CALCIUM, IONIZED - Abnormal; Notable for the following:    Calcium, Ionized, Serum 3.4 (*)    All other components within normal limits  HEPATIC FUNCTION PANEL - Abnormal; Notable for the following:    Total Protein 6.1 (*)    Albumin 3.2 (*)    ALT 15 (*)    Bilirubin, Direct <0.1 (*)    All other components within  normal limits  CBC - Abnormal; Notable for the following:    RBC 4.05 (*)    Hemoglobin 12.3 (*)    HCT 35.5 (*)    All other components within normal limits  COMPREHENSIVE METABOLIC PANEL - Abnormal; Notable for the following:    Potassium 3.2 (*)    Glucose, Bld 136 (*)    Calcium 7.0 (*)    Total Protein 6.2 (*)    Albumin 3.2 (*)    ALT 16 (*)    All other components within normal limits  MAGNESIUM - Abnormal; Notable for the following:    Magnesium 1.2 (*)    All other components within normal limits  POTASSIUM - Abnormal; Notable for the following:    Potassium 3.1 (*)    All other components within normal limits  MAGNESIUM - Abnormal; Notable for the following:    Magnesium 2.8 (*)    All other components within normal limits  PHOSPHORUS  MAGNESIUM  PTH, INTACT AND CALCIUM  CALCITRIOL (1,25 DI-OH VIT D)  VITAMIN D 25 HYDROXY (VIT D DEFICIENCY, FRACTURES)  I-STAT TROPOININ, ED    Imaging Review No results found. I have personally reviewed and evaluated these images and lab results as part of my medical decision-making.   EKG Interpretation None      MDM   Final diagnoses:  Hypocalcemia  Hypomagnesemia  Facial numbness   70yo male with history of htn, CHF, CAD who presents with concern from urgent care for hypomagnesemia, hypocalcemia after presenting with bilateral numbness of top of  head and face.  Bilateral symptoms without other neurologic deficit not consistent with CVA.  Patient with severe hypomagnesemia and hypocalcemia and symptoms likely secondary to this.  Given Mg, Ca, will admit for continued monitoring and replacement.Gareth Morgan, MD 10/09/15 1538

## 2015-10-08 NOTE — ED Notes (Signed)
Spoke with PA about patient status, she stated she would go check on patient and place orders for stat lab draws at this time.

## 2015-10-08 NOTE — ED Notes (Addendum)
Pt sent here from [facility] d/t critical Mg 0.5 and Ca 6.5 levels.  Pt reports facial numbness x 1 week.  Pt is A&O x 4.  No other neuro deficits noted at this time.  Pt also reports diarrhea which started x 2 days

## 2015-10-08 NOTE — Telephone Encounter (Signed)
Call pt - check status. If still having dizziness and face parasthesias - recommend evaluation through ER based on these labs. Let me know status.

## 2015-10-08 NOTE — Progress Notes (Signed)
Spoke with pt prior to leaving [facility] ED, he considers [facility] providers as pcp  Pt states he sees various male providers at [facility] and agreed to enter [facility] (pcp for 10/08/15 [place] encounter) as his pcp EPIC updated

## 2015-10-08 NOTE — Telephone Encounter (Signed)
Noted. Agree with ER eval.  Advised triage nurse at [facility].

## 2015-10-08 NOTE — H&P (Signed)
History and Physical    Jared White [id number] DOB: [birth year] DOA: 10/08/2015  Referring MD/NP/PA: EDP PCP: [name], MD   Chief Complaint: low calcium  HPI: Jared White is a 70 y.o. male with medical history significant of CAD/CABG, HTN, CHF and was seen by his primary care on Friday due to tingling of his forehead, scalp and some tingling and numbness around his mouth, which was somewhat sudden in onset. He had labs drawn on Saturday, and was told that he had low calcium and magnesium. Went back on Wednesday to have labs drawn again and was sent to the emergency room due to symptomatic hypocalcemia. Patient reports feeling okay except for his symptoms on Friday he denies any seizure-like activity he denies any muscle spasms, involuntary movements, tingling etc. Patient denies any history of thyroid or parathyroid surgery, no history of diuretic use. No changes in his medications recently. Recalls being told that his magnesium was low over a year ago and was on some supplementation then. In the emergency room found to have severe hypomagnesemia and hypocalcemia calcium of 6.4 with albumin of 3.2  ED Course:   Review of Systems: As per HPI otherwise 10 point review of systems negative.    Past Medical History  Diagnosis Date  . Broken back   . Irregular heart beat   . GERD (gastroesophageal reflux disease)   . CHF (congestive heart failure) ([place])   . Hypertension   . CAD (coronary artery disease)     PCI & CABG  . Hyperlipidemia   . Systemic hypertension   . Diverticulitis     mild - approx 2004  . Family history of heart disease   . History of nuclear stress test 06/30/2011    lexiscan; normal pattern of perfusion post-stress; low risk scan     Past Surgical History  Procedure Laterality Date  . Lung lobectomy      left upper lobe  . Coronary artery bypass graft  01/2000    x5 - LIMA to LAD, SVG to diagonal, sequential SVG to ramus & OM, SVG to PDA (Dr. Tharon Aquas Trigt)  . Cardiac catheterization  08/1995    PTCA of OM (Dr. Marella Chimes)  . Transthoracic echocardiogram  12/24/2012    EF 55-60%, mild LVH, mild conc hypertrophy; mild MR; LA mildly dilated     reports that he has never smoked. He has never used smokeless tobacco. He reports that he drinks alcohol. He reports that he does not use illicit drugs.  No Known Allergies  Family History  Problem Relation Age of Onset  . Heart attack Mother   . Cancer Father   . Kidney failure Brother    Prior to Admission medications   Medication Sig Start Date End Date Taking? Authorizing Provider  amLODipine (NORVASC) 5 MG tablet TAKE ONE TABLET BY MOUTH ONE TIME DAILY 01/12/15  Yes Pixie Casino, MD  aspirin 325 MG tablet Take 325 mg by mouth daily.   Yes Historical Provider, MD  atenolol (TENORMIN) 50 MG tablet TAKE ONE TABLET BY MOUTH ONE TIME DAILY 04/10/15  Yes Pixie Casino, MD  clobetasol cream (TEMOVATE) [id number] % Apply 1 application topically 2 (two) times daily as needed. Patient taking differently: Apply 1 application topically 2 (two) times daily as needed (for rash).  11/21/13  Yes Pixie Casino, MD  fluticasone (FLONASE) 50 MCG/ACT nasal spray USE TWO SPRAYS IN [facility] NOSTRIL DAILY  04/24/14  Yes Pixie Casino, MD  Magnesium Oxide 400 MG CAPS Take 1 capsule (400 mg total) by mouth as directed. Take 1 tablet twice daily for 1 week then take once daily. Patient taking differently: Take 400 mg by mouth daily.  11/25/13  Yes Pixie Casino, MD  omeprazole (PRILOSEC) 20 MG capsule TAKE ONE CAPSULE BY MOUTH ONE TIME DAILY 11/17/14  Yes Pixie Casino, MD  potassium chloride SA (K-DUR,KLOR-CON) 20 MEQ tablet Take 1 tablet (20 mEq total) by mouth daily. 11/25/13  Yes Pixie Casino, MD  simvastatin (ZOCOR) 80 MG tablet TAKE 1 TABLET BY MOUTH AT BEDTIME. Patient taking differently: TAKE 1 TABLET BY MOUTH AT daily 04/10/15  Yes Pixie Casino, MD  valsartan (DIOVAN) 160 MG tablet TAKE 1 TABLET BY  MOUTH ONE TIME DAILY. 11/17/14  Yes Pixie Casino, MD    Physical Exam: Filed Vitals:   10/08/15 0953 10/08/15 1135 10/08/15 1335  BP: 166/80 140/74 149/69  Pulse: 69 63 57  Temp: 99.3 F (37.4 C) 98.5 F (36.9 C) 98.4 F (36.9 C)  TempSrc: Oral Oral Oral  Resp: 14 21 16   Height:   5\' 9"  (1.753 m)  Weight:   81.33 kg (179 lb 4.8 oz)  SpO2: 100% 95% 99%     Constitutional: NAD, calm, comfortable Filed Vitals:   10/08/15 0953 10/08/15 1135 10/08/15 1335  BP: 166/80 140/74 149/69  Pulse: 69 63 57  Temp: 99.3 F (37.4 C) 98.5 F (36.9 C) 98.4 F (36.9 C)  TempSrc: Oral Oral Oral  Resp: 14 21 16   Height:   5\' 9"  (1.753 m)  Weight:   81.33 kg (179 lb 4.8 oz)  SpO2: 100% 95% 99%   Eyes: PERRL, lids and conjunctivae normal ENMT: Mucous membranes are moist. Posterior pharynx clear of any exudate or lesions.Normal dentition.  Neck: normal, supple, no masses, no thyromegaly Respiratory: clear to auscultation bilaterally, no wheezing, no crackles. Normal respiratory effort. No accessory muscle use.  Cardiovascular: Regular rate and rhythm, no murmurs / rubs / gallops. No extremity edema. 2+ pedal pulses. No carotid bruits.  Abdomen: no tenderness, no masses palpated. No hepatosplenomegaly. Bowel sounds positive.  Musculoskeletal: no clubbing / cyanosis. No joint deformity upper and lower extremities. Good ROM, no contractures. Normal muscle tone.  Skin: no rashes, lesions, ulcers. No induration Neurologic: CN 2-12 grossly intact. Sensation intact, DTR normal. Strength 5/5 in all 4.  Psychiatric: Normal judgment and insight. Alert and oriented x 3. Normal mood.    Labs on Admission: I have personally reviewed following labs and imaging studies  CBC:  Recent Labs Lab 10/03/15 1603 10/08/15 1045  WBC 9.5 8.5  HGB 13.2* 12.5*  HCT 38.3* 36.0*  MCV 90.0 87.2  PLT  --  [id number]   Basic Metabolic Panel:  Recent Labs Lab 10/03/15 1603 10/07/15 1148 10/08/15 1045  NA 145   --  141  K 3.8  --  3.2*  CL 108  --  108  CO2 25  --  26  GLUCOSE 102*  --  131*  BUN 19  --  18  CREATININE 1.05  --  1.04  CALCIUM 6.4* 6.5* 6.4*  MG  --  0.5* 0.5*  PHOS  --  2.8 3.2   GFR: Estimated Creatinine Clearance: 67 mL/min (by C-G formula based on Cr of 1.04). Liver Function Tests:  Recent Labs Lab 10/03/15 1603 10/08/15 1058  AST 17 19  ALT 13 15*  ALKPHOS 67 62  BILITOT 0.4 0.3  PROT 6.2 6.1*  ALBUMIN 3.5* 3.2*   No results for input(s): LIPASE, AMYLASE in the last 168 hours. No results for input(s): AMMONIA in the last 168 hours. Coagulation Profile: No results for input(s): INR, PROTIME in the last 168 hours. Cardiac Enzymes: No results for input(s): CKTOTAL, CKMB, CKMBINDEX, TROPONINI in the last 168 hours. BNP (last 3 results) No results for input(s): PROBNP in the last 8760 hours. HbA1C: No results for input(s): HGBA1C in the last 72 hours. CBG: No results for input(s): GLUCAP in the last 168 hours. Lipid Profile: No results for input(s): CHOL, HDL, LDLCALC, TRIG, CHOLHDL, LDLDIRECT in the last 72 hours. Thyroid Function Tests: No results for input(s): TSH, T4TOTAL, FREET4, T3FREE, THYROIDAB in the last 72 hours. Anemia Panel: No results for input(s): VITAMINB12, FOLATE, FERRITIN, TIBC, IRON, RETICCTPCT in the last 72 hours. Urine analysis:    Component Value Date/Time   COLORURINE YELLOW 11/21/2013 1008   APPEARANCEUR CLEAR 11/21/2013 1008   LABSPEC 1.019 11/21/2013 1008   PHURINE 6.5 11/21/2013 1008   GLUCOSEU NEG 11/21/2013 1008   HGBUR MOD* 11/21/2013 1008   BILIRUBINUR NEG 11/21/2013 1008   KETONESUR NEG 11/21/2013 1008   PROTEINUR 100* 11/21/2013 1008   UROBILINOGEN 1 11/21/2013 1008   NITRITE NEG 11/21/2013 1008   LEUKOCYTESUR NEG 11/21/2013 1008   Sepsis Labs: @LABRCNTIP (procalcitonin:4,lacticidven:4) )No results found for this or any previous visit (from the past 240 hour(s)).   Radiological Exams on Admission: No results  found.  EKG: Independently reviewed. NSR, no acute ST t wave changes  Assessment/Plan    Hypocalcemia -suspect due to severe hypomagnesemia -replace Magnesium, denies ETOH use, received 2gm Mag in ER -IV mag 2gm again and PO 400mg  BID -also check PTH, Vitamin D level    HTN (hypertension) -stable, continue amlodipine, atenolol    S/P CABG x 4 -continue ASA/atenolol/statin -stable  DVT prophylaxis: lovenox Code Status:Full Code Family Communication: none at bedside Disposition Plan: home in 1-2days  Admission status:inpatient   Domenic Polite MD [facility] Pager [phone]  If 7PM-7AM, please contact night-coverage [email or web address] Password [facility]  10/08/2015, 3:49 PM

## 2015-10-08 NOTE — ED Notes (Signed)
Pt can go to floor at 13:03

## 2015-10-09 DIAGNOSIS — R208 Other disturbances of skin sensation: Secondary | ICD-10-CM

## 2015-10-09 DIAGNOSIS — R200 Anesthesia of skin: Secondary | ICD-10-CM | POA: Insufficient documentation

## 2015-10-09 DIAGNOSIS — Z951 Presence of aortocoronary bypass graft: Secondary | ICD-10-CM

## 2015-10-09 LAB — MAGNESIUM
Magnesium: 1.2 mg/dL — ABNORMAL LOW (ref 1.7–2.4)
Magnesium: 2.4 mg/dL (ref 1.7–2.4)
Magnesium: 2.8 mg/dL — ABNORMAL HIGH (ref 1.7–2.4)

## 2015-10-09 LAB — COMPREHENSIVE METABOLIC PANEL
ALT: 16 U/L — ABNORMAL LOW (ref 17–63)
AST: 17 U/L (ref 15–41)
Albumin: 3.2 g/dL — ABNORMAL LOW (ref 3.5–5.0)
Alkaline Phosphatase: 68 U/L (ref 38–126)
Anion gap: 7 (ref 5–15)
BUN: 17 mg/dL (ref 6–20)
CO2: 25 mmol/L (ref 22–32)
Calcium: 7 mg/dL — ABNORMAL LOW (ref 8.9–10.3)
Chloride: 109 mmol/L (ref 101–111)
Creatinine, Ser: 0.87 mg/dL (ref 0.61–1.24)
GFR calc Af Amer: >60 mL/min (ref >60)
GFR calc non Af Amer: >60 mL/min (ref >60)
Glucose, Bld: 136 mg/dL — ABNORMAL HIGH (ref 65–99)
Potassium: 3.2 mmol/L — ABNORMAL LOW (ref 3.5–5.1)
Sodium: 141 mmol/L (ref 135–145)
Total Bilirubin: 0.8 mg/dL (ref 0.3–1.2)
Total Protein: 6.2 g/dL — ABNORMAL LOW (ref 6.5–8.1)

## 2015-10-09 LAB — CBC
HCT: 35.5 % — ABNORMAL LOW (ref 39.0–52.0)
Hemoglobin: 12.3 g/dL — ABNORMAL LOW (ref 13.0–17.0)
MCH: 30.4 pg (ref 26.0–34.0)
MCHC: 34.6 g/dL (ref 30.0–36.0)
MCV: 87.7 fL (ref 78.0–100.0)
Platelets: 151 10*3/uL (ref 150–400)
RBC: 4.05 MIL/uL — ABNORMAL LOW (ref 4.22–5.81)
RDW: 13.4 % (ref 11.5–15.5)
WBC: 9.6 10*3/uL (ref 4.0–10.5)

## 2015-10-09 LAB — CALCIUM, IONIZED: Calcium, Ionized, Serum: 3.4 mg/dL — ABNORMAL LOW (ref 4.5–5.6)

## 2015-10-09 LAB — POTASSIUM: Potassium: 3.1 mmol/L — ABNORMAL LOW (ref 3.5–5.1)

## 2015-10-09 MED ORDER — MAGNESIUM SULFATE 4 GM/100ML IV SOLN
4.0000 g | Freq: Once | INTRAVENOUS | Status: AC
  Administered 2015-10-09: 4 g via INTRAVENOUS
  Filled 2015-10-09: qty 100

## 2015-10-09 MED ORDER — POTASSIUM CHLORIDE CRYS ER 20 MEQ PO TBCR: 40.0000 meq | EXTENDED_RELEASE_TABLET | Freq: Every day | ORAL | Status: DC

## 2015-10-09 MED ORDER — POTASSIUM CHLORIDE CRYS ER 10 MEQ PO TBCR
30.0000 meq | EXTENDED_RELEASE_TABLET | Freq: Once | ORAL | Status: AC
  Administered 2015-10-09: 30 meq via ORAL
  Filled 2015-10-09: qty 1

## 2015-10-09 MED ORDER — POTASSIUM CHLORIDE CRYS ER 20 MEQ PO TBCR
40.0000 meq | EXTENDED_RELEASE_TABLET | Freq: Once | ORAL | Status: AC
  Administered 2015-10-09: 40 meq via ORAL
  Filled 2015-10-09: qty 2

## 2015-10-09 MED ORDER — MAGNESIUM OXIDE 400 MG PO CAPS: 400.0000 mg | ORAL_CAPSULE | Freq: Two times a day (BID) | ORAL | Status: DC

## 2015-10-09 MED ORDER — CALCIUM CARBONATE ANTACID 500 MG PO CHEW: 1.0000 | CHEWABLE_TABLET | Freq: Three times a day (TID) | ORAL | Status: DC

## 2015-10-09 NOTE — Discharge Summary (Signed)
Physician Discharge Summary  Jared White [id number] DOB: Aug 21, 1945 DOA: 10/08/2015  PCP: [name], MD  Admit date: 10/08/2015 Discharge date: 10/09/2015  Time spent: 45 minutes  Recommendations for Outpatient Follow-up:  1. Dr.[name] in 1 week, please repeat Cmet and Mag in 1 week, would benefit from ENdocrine/Renal referral for this 2. repeat PTH, Vitamin D level pending at discharge, please Follow up   Discharge Diagnoses:    Severe hypomagnesemia   Hypocalcemia   HTN (hypertension)   S/P CABG x 4   Hypocalcemia   Facial numbness   Hypomagnesemia   Discharge Condition: stable  Diet recommendation: heart healthy  Filed Weights   10/08/15 1335 10/09/15 0500  Weight: 81.33 kg (179 lb 4.8 oz) 81.693 kg (180 lb 1.6 oz)    History of present illness:  HPI: Jared White is a 70 y.o. male with medical history significant of CAD/CABG, HTN, CHF and was seen by his primary care on Friday due to tingling of his forehead, scalp and some tingling and numbness around his mouth, which was somewhat sudden in onset. He had labs drawn on Saturday, and was told that he had low calcium and magnesium. Went back on Wednesday to have labs drawn again and was sent to the emergency room due to symptomatic hypocalcemia. Patient reports feeling okay except for his symptoms on Friday he denies any seizure-like activity he denies any muscle spasms, involuntary movements, tingling etc. Patient denies any history of thyroid or parathyroid surgery, no history of diuretic use. No changes in his medications recently. Recalls being told that his magnesium was low over a year ago and was on some supplementation then. In the emergency room found to have severe hypomagnesemia and hypocalcemia calcium of 6.4 with albumin of 3.2  Hospital Course:  Hypocalcemia -Due to severe hypomagnesemia -replaced Magnesium IV and PO -was given IV magnesium, 3 doses of 2gms -started back on Po Magnesium -PTH  checked by PCP was low normal of what was expected which corresponds with low Mag as likely etiology -repeat PTH, Vitamin D level pending at time of discharge -he had no more symptoms of hypocalcemia like he experienced on Friday prior to admission -at time of discharge his corrected calcium is 7.6, started Po calcium carbonate TID  Severe hypomagnesemia -etiology unclear -denies ETOH use/chronic GI losses or diuretic use -could have urinary salt wasting syndromes -would benefit from ENdocrine/Renal evaluation as outpatient for this -in the meantime i have asked him to continue magnesium BID    HTN (hypertension) -stable, continue amlodipine, atenolol   S/P CABG x 4 -continue ASA/atenolol/statin -stable  Discharge Exam: Filed Vitals:   10/09/15 0539 10/09/15 1011  BP: 141/76 175/91  Pulse: 61 85  Temp: 98.4 F (36.9 C)   Resp: 16     General: AAOx3 Cardiovascular: S1S2/RRR Respiratory: CTAB  Discharge Instructions   Discharge Instructions    Diet - low sodium heart healthy    Complete by:  As directed      Increase activity slowly    Complete by:  As directed           Discharge Medication List as of 10/09/2015  1:02 PM    START taking these medications   Details  calcium carbonate (TUMS - DOSED IN MG ELEMENTAL CALCIUM) 500 MG chewable tablet Chew 1 tablet (200 mg of elemental calcium total) by mouth 3 (three) times daily with meals., Starting 10/09/2015, Until Discontinued, Print      CONTINUE these medications which have  CHANGED   Details  Magnesium Oxide 400 MG CAPS Take 1 capsule (400 mg total) by mouth 2 (two) times daily., Starting 10/09/2015, Until Discontinued, Print    potassium chloride SA (K-DUR,KLOR-CON) 20 MEQ tablet Take 2 tablets (40 mEq total) by mouth daily., Starting 10/09/2015, Until Discontinued, Print      CONTINUE these medications which have NOT CHANGED   Details  amLODipine (NORVASC) 5 MG tablet TAKE ONE TABLET BY MOUTH ONE TIME DAILY,  Normal    aspirin 325 MG tablet Take 325 mg by mouth daily., Until Discontinued, Historical Med    atenolol (TENORMIN) 50 MG tablet TAKE ONE TABLET BY MOUTH ONE TIME DAILY, Normal    clobetasol cream (TEMOVATE) [id number] % Apply 1 application topically 2 (two) times daily as needed., Starting 11/21/2013, Until Discontinued, Phone In    fluticasone (FLONASE) 50 MCG/ACT nasal spray USE TWO SPRAYS IN EACH NOSTRIL DAILY , Normal    omeprazole (PRILOSEC) 20 MG capsule TAKE ONE CAPSULE BY MOUTH ONE TIME DAILY, Normal    simvastatin (ZOCOR) 80 MG tablet TAKE 1 TABLET BY MOUTH AT BEDTIME., Normal    valsartan (DIOVAN) 160 MG tablet TAKE 1 TABLET BY MOUTH ONE TIME DAILY., Normal       No Known Allergies Follow-up Information    Follow up with [name],JEFFREY R, MD In 1 week.   Specialties:  Family Medicine, Sports Medicine   Why:  please check Cmet and Mag at Followup   Contact information:   [place] [id number] [phone]       Follow up with KERR,JEFFREY, MD. Schedule an appointment as soon as possible for a visit in 1 month.   Specialty:  Endocrinology   Why:  For workup for Low calcium/mag   Contact information:   [address] [place] [address] [phone]        The results of significant diagnostics from this hospitalization (including imaging, microbiology, ancillary and laboratory) are listed below for reference.    Significant Diagnostic Studies: No results found.  Microbiology: No results found for this or any previous visit (from the past 240 hour(s)).   Labs: Basic Metabolic Panel:  Recent Labs Lab 10/03/15 1603 10/07/15 1148 10/08/15 1045 10/08/15 2331 10/09/15 0458 10/09/15 1013  NA 145  --  141  --  141  --   K 3.8  --  3.2* 3.1* 3.2*  --   CL 108  --  108  --  109  --   CO2 25  --  26  --  25  --   GLUCOSE 102*  --  131*  --  136*  --   BUN 19  --  18  --  17  --   CREATININE 1.05  --  1.04  --  0.87  --    CALCIUM 6.4* 6.5* 6.4*  --  7.0*  --   MG  --  0.5* 0.5* 1.2* 2.8* 2.4  PHOS  --  2.8 3.2  --   --   --    Liver Function Tests:  Recent Labs Lab 10/03/15 1603 10/08/15 1058 10/09/15 0458  AST 17 19 17   ALT 13 15* 16*  ALKPHOS 67 62 68  BILITOT 0.4 0.3 0.8  PROT 6.2 6.1* 6.2*  ALBUMIN 3.5* 3.2* 3.2*   No results for input(s): LIPASE, AMYLASE in the last 168 hours. No results for input(s): AMMONIA in the last 168 hours. CBC:  Recent Labs Lab 10/03/15 1603  10/08/15 1045 10/09/15 0458  WBC 9.5 8.5 9.6  HGB 13.2* 12.5* 12.3*  HCT 38.3* 36.0* 35.5*  MCV 90.0 87.2 87.7  PLT  --  159 151   Cardiac Enzymes: No results for input(s): CKTOTAL, CKMB, CKMBINDEX, TROPONINI in the last 168 hours. BNP: BNP (last 3 results) No results for input(s): BNP in the last 8760 hours.  ProBNP (last 3 results) No results for input(s): PROBNP in the last 8760 hours.  CBG: No results for input(s): GLUCAP in the last 168 hours.     SignedDomenic Polite MD.  [facility] 10/09/2015, 2:22 PM

## 2015-10-10 LAB — PTH, INTACT AND CALCIUM
Calcium, Total (PTH): 7 mg/dL — ABNORMAL LOW (ref 8.6–10.2)
PTH: 41 pg/mL (ref 15–65)

## 2015-10-10 LAB — VITAMIN D 25 HYDROXY (VIT D DEFICIENCY, FRACTURES): Vit D, 25-Hydroxy: 12.8 ng/mL — ABNORMAL LOW (ref 30.0–100.0)

## 2015-10-12 LAB — CALCITRIOL (1,25 DI-OH VIT D): Vit D, 1,25-Dihydroxy: 53.3 pg/mL (ref 19.9–79.3)

## 2015-10-16 ENCOUNTER — Ambulatory Visit (INDEPENDENT_AMBULATORY_CARE_PROVIDER_SITE_OTHER): Payer: Medicare Other | Admitting: Physician Assistant

## 2015-10-16 DIAGNOSIS — R809 Proteinuria, unspecified: Secondary | ICD-10-CM | POA: Diagnosis not present

## 2015-10-16 DIAGNOSIS — E559 Vitamin D deficiency, unspecified: Secondary | ICD-10-CM

## 2015-10-16 LAB — COMPREHENSIVE METABOLIC PANEL
ALT: 15 U/L (ref 9–46)
AST: 16 U/L (ref 10–35)
Albumin: 4 g/dL (ref 3.6–5.1)
Alkaline Phosphatase: 80 U/L (ref 40–115)
BUN: 20 mg/dL (ref 7–25)
CO2: 27 mmol/L (ref 20–31)
Calcium: 9.5 mg/dL (ref 8.6–10.3)
Chloride: 103 mmol/L (ref 98–110)
Creat: 1.05 mg/dL (ref 0.70–1.25)
Glucose, Bld: 106 mg/dL — ABNORMAL HIGH (ref 65–99)
Potassium: 4.9 mmol/L (ref 3.5–5.3)
Sodium: 141 mmol/L (ref 135–146)
Total Bilirubin: 0.4 mg/dL (ref 0.2–1.2)
Total Protein: 6.9 g/dL (ref 6.1–8.1)

## 2015-10-16 LAB — MAGNESIUM: Magnesium: 1.5 mg/dL (ref 1.5–2.5)

## 2015-10-16 MED ORDER — ERGOCALCIFEROL 1.25 MG (50000 UT) PO CAPS: 50000.0000 [IU] | ORAL_CAPSULE | ORAL | Status: DC

## 2015-10-16 NOTE — Patient Instructions (Addendum)
Continue the medications you are on. Add the Vitamin D.    IF you received an x-ray today, you will receive an invoice from [facility]. Please contact [facility] at [phone] with questions or concerns regarding your invoice.   IF you received labwork today, you will receive an invoice from [employer]. Please contact [facility] at [phone] with questions or concerns regarding your invoice.   Our billing staff will not be able to assist you with questions regarding bills from these companies.  You will be contacted with the lab results as soon as they are available. The fastest way to get your results is to activate your My Chart account. Instructions are located on the last page of this paperwork. If you have not heard from [place] regarding the results in 2 weeks, please contact this office.

## 2015-10-16 NOTE — Progress Notes (Signed)
Patient ID: Jared White, male    DOB: 03-Mar-1946, 69 y.o.   MRN: [id number]  PCP: [name], MD (appears that this designation was made upon ED-hospitalization on 10/08/15, though the patient has never seen him, and he has been seen in this office only one time, on 10/03/2015 by Dr. Laney Pastor). He was followed by Dr. Rollene Fare until his retirement, and the patient states that he does not have a PCP.  Subjective:   Chief Complaint  Patient presents with  . Hospitalization Follow-up    Was told to follow up for labs after hospital visit.    HPI Presents for Hospital follow-up.  He was admitted through the ED on 10/08/15 due to symptomatic hypocalcemia. This was identified on evaluation of reports of tingling sensation of the forehead, scalp and around his mouth. He reports that he'd had several days of diarrhea from suspected food poisoning just prior to the onset of symptoms. In the emergency room found to have severe hypomagnesemia and hypocalcemia calcium of 6.4 with albumin of 3.2. During hospitalization, he received IV and oral magnesium and at the time of discharge his corrected calcium is 7.6. Etiology of severe hypomagnesemia unclear. He was to continue oral calcium TID and magnesium BID, started in hospital.  During the hospitalization his HTN remained stable and his home medications were continued.  Discharged on 10/09/15.  Feels good. States that he has had no symptoms since the Thursday prior to his hospitalization. A little diarrhea following the first meal after hospital discharge. May have a mild increase in urinary frequency, but that has been ocurring slowly over time and is not worse since hospitalization.  Reports a history of protein in the urine, advised by the insurance nurse, and he had a special test for that, and was told it was normal. Chart review reveals urine microalbumin 11/2013 elevated at 123.36.   Repeat PTH 41, total calcium 7.0  Vitamin D 12.8  Sees  Dr. Buddy Duty 10/26/2015  Recommendations for Outpatient Follow-up:  1. Dr.[name] in 1 week, please repeat Cmet and Mag in 1 week, would benefit from ENdocrine/Renal referral for this 2. repeat PTH, Vitamin D level pending at discharge, please Follow up   Discharge Diagnoses:   Severe hypomagnesemia  Hypocalcemia  HTN (hypertension)  S/P CABG x 4  Hypocalcemia  Facial numbness  Hypomagnesemia    Review of Systems  Constitutional: Negative for activity change, appetite change, fatigue and unexpected weight change.  HENT: Negative for congestion, dental problem, ear pain, hearing loss, mouth sores, postnasal drip, rhinorrhea, sneezing, sore throat, tinnitus and trouble swallowing.   Eyes: Negative for photophobia, pain, redness and visual disturbance.  Respiratory: Negative for cough, chest tightness and shortness of breath.   Cardiovascular: Negative for chest pain, palpitations and leg swelling.  Gastrointestinal: Negative for nausea, vomiting, abdominal pain, diarrhea, constipation and blood in stool.  Genitourinary: Negative for dysuria, urgency, frequency and hematuria.  Musculoskeletal: Negative for myalgias, arthralgias, gait problem and neck stiffness.  Skin: Negative for rash.  Neurological: Negative for dizziness, speech difficulty, weakness, light-headedness, numbness and headaches.  Hematological: Negative for adenopathy.  Psychiatric/Behavioral: Negative for confusion and sleep disturbance. The patient is not nervous/anxious.        Patient Active Problem List   Diagnosis Date Noted  . Facial numbness   . Hypomagnesemia   . Hypocalcemia 10/08/2015  . Psoriasis 11/21/2013  . S/P CABG x 4 03/14/2013  . Multiple thoracic/lumbar transverse process fractures 12/15/2012  . Fall from roof 12/15/2012  .  CAD (coronary artery disease) 12/15/2012  . GERD (gastroesophageal reflux disease) 12/15/2012  . HTN (hypertension) 12/15/2012  . Hyperlipidemia 12/15/2012  .  Multiple bilateral rib fractures      Prior to Admission medications   Medication Sig Start Date End Date Taking? Authorizing Provider  amLODipine (NORVASC) 5 MG tablet TAKE ONE TABLET BY MOUTH ONE TIME DAILY 01/12/15  Yes Pixie Casino, MD  aspirin 325 MG tablet Take 325 mg by mouth daily.   Yes Historical Provider, MD  atenolol (TENORMIN) 50 MG tablet TAKE ONE TABLET BY MOUTH ONE TIME DAILY 04/10/15  Yes Pixie Casino, MD  calcium carbonate (TUMS - DOSED IN MG ELEMENTAL CALCIUM) 500 MG chewable tablet Chew 1 tablet (200 mg of elemental calcium total) by mouth 3 (three) times daily with meals. 10/09/15  Yes Domenic Polite, MD  clobetasol cream (TEMOVATE) [id number] % Apply 1 application topically 2 (two) times daily as needed. Patient taking differently: Apply 1 application topically 2 (two) times daily as needed (for rash).  11/21/13  Yes Pixie Casino, MD  fluticasone (FLONASE) 50 MCG/ACT nasal spray USE TWO SPRAYS IN [facility] NOSTRIL DAILY  04/24/14  Yes Pixie Casino, MD  Magnesium Oxide 400 MG CAPS Take 1 capsule (400 mg total) by mouth 2 (two) times daily. 10/09/15  Yes Domenic Polite, MD  omeprazole (PRILOSEC) 20 MG capsule TAKE ONE CAPSULE BY MOUTH ONE TIME DAILY 11/17/14  Yes Pixie Casino, MD  potassium chloride SA (K-DUR,KLOR-CON) 20 MEQ tablet Take 2 tablets (40 mEq total) by mouth daily. 10/09/15  Yes Domenic Polite, MD  simvastatin (ZOCOR) 80 MG tablet TAKE 1 TABLET BY MOUTH AT BEDTIME. Patient taking differently: TAKE 1 TABLET BY MOUTH AT daily 04/10/15  Yes Pixie Casino, MD  valsartan (DIOVAN) 160 MG tablet TAKE 1 TABLET BY MOUTH ONE TIME DAILY. 11/17/14  Yes Pixie Casino, MD     No Known Allergies     Objective:  Physical Exam  Constitutional: He is oriented to person, place, and time. He appears well-developed and well-nourished. He is active and cooperative. No distress.  BP 130/74 mmHg  Pulse 64  Temp(Src) 97.7 F (36.5 C) (Oral)  Resp 18  Ht 5\' 9"  (1.753 m)  Wt  182 lb 6.4 oz (82.736 kg)  BMI 26.92 kg/m2  SpO2 98%  HENT:  Head: Normocephalic and atraumatic.  Right Ear: Hearing normal.  Left Ear: Hearing normal.  Eyes: Conjunctivae are normal. No scleral icterus.  Neck: Normal range of motion. Neck supple. No thyromegaly present.  Cardiovascular: Normal rate, regular rhythm and normal heart sounds.   Pulses:      Radial pulses are 2+ on the right side, and 2+ on the left side.  Pulmonary/Chest: Effort normal and breath sounds normal.  Lymphadenopathy:       Head (right side): No tonsillar, no preauricular, no posterior auricular and no occipital adenopathy present.       Head (left side): No tonsillar, no preauricular, no posterior auricular and no occipital adenopathy present.    He has no cervical adenopathy.       Right: No supraclavicular adenopathy present.       Left: No supraclavicular adenopathy present.  Neurological: He is alert and oriented to person, place, and time. No sensory deficit.  Skin: Skin is warm, dry and intact. No rash noted. No cyanosis or erythema. Nails show no clubbing.  Psychiatric: He has a normal mood and affect. His speech is normal and behavior is  normal.           Assessment & Plan:   1. Hypocalcemia - Comprehensive metabolic panel  2. Hypomagnesemia - Magnesium  3. Microalbuminuria - Microalbumin, urine  4. Vitamin D deficiency - ergocalciferol (VITAMIN D2) 50000 units capsule; Take 1 capsule (50,000 Units total) by mouth once a week.  Dispense: 12 capsule; Refill: 3  Proceed with endocrinology appointment as scheduled.   Fara Chute, PA-C Physician Assistant-Certified [facility]

## 2015-10-17 LAB — MICROALBUMIN, URINE: Microalb, Ur: 169.7 mg/dL

## 2015-11-06 ENCOUNTER — Encounter (HOSPITAL_COMMUNITY): Payer: Self-pay | Admitting: Emergency Medicine

## 2015-11-06 ENCOUNTER — Emergency Department (HOSPITAL_COMMUNITY)
Admission: EM | Admit: 2015-11-06 | Discharge: 2015-11-06 | Disposition: A | Discharge status: Home / Self Care | Payer: Medicare Other | Attending: Emergency Medicine | Admitting: Emergency Medicine

## 2015-11-06 DIAGNOSIS — Z955 Presence of coronary angioplasty implant and graft: Secondary | ICD-10-CM | POA: Insufficient documentation

## 2015-11-06 DIAGNOSIS — I509 Heart failure, unspecified: Secondary | ICD-10-CM | POA: Insufficient documentation

## 2015-11-06 DIAGNOSIS — I2510 Atherosclerotic heart disease of native coronary artery without angina pectoris: Secondary | ICD-10-CM | POA: Insufficient documentation

## 2015-11-06 DIAGNOSIS — Z7951 Long term (current) use of inhaled steroids: Secondary | ICD-10-CM | POA: Diagnosis not present

## 2015-11-06 DIAGNOSIS — Z7982 Long term (current) use of aspirin: Secondary | ICD-10-CM | POA: Insufficient documentation

## 2015-11-06 DIAGNOSIS — R200 Anesthesia of skin: Secondary | ICD-10-CM

## 2015-11-06 DIAGNOSIS — Z79899 Other long term (current) drug therapy: Secondary | ICD-10-CM | POA: Insufficient documentation

## 2015-11-06 DIAGNOSIS — R202 Paresthesia of skin: Secondary | ICD-10-CM | POA: Diagnosis present

## 2015-11-06 DIAGNOSIS — I110 Hypertensive heart disease with heart failure: Secondary | ICD-10-CM | POA: Diagnosis not present

## 2015-11-06 DIAGNOSIS — E785 Hyperlipidemia, unspecified: Secondary | ICD-10-CM | POA: Insufficient documentation

## 2015-11-06 LAB — CBC WITH DIFFERENTIAL/PLATELET
Basophils Absolute: 0.1 10*3/uL (ref 0.0–0.1)
Basophils Relative: 1 %
Eosinophils Absolute: 0.2 10*3/uL (ref 0.0–0.7)
Eosinophils Relative: 2 %
HCT: 39.5 % (ref 39.0–52.0)
Hemoglobin: 13.3 g/dL (ref 13.0–17.0)
Lymphocytes Relative: 17 %
Lymphs Abs: 1.6 10*3/uL (ref 0.7–4.0)
MCH: 30.8 pg (ref 26.0–34.0)
MCHC: 33.7 g/dL (ref 30.0–36.0)
MCV: 91.4 fL (ref 78.0–100.0)
Monocytes Absolute: 0.7 10*3/uL (ref 0.1–1.0)
Monocytes Relative: 8 %
Neutro Abs: 6.7 10*3/uL (ref 1.7–7.7)
Neutrophils Relative %: 72 %
Platelets: 189 10*3/uL (ref 150–400)
RBC: 4.32 MIL/uL (ref 4.22–5.81)
RDW: 13 % (ref 11.5–15.5)
WBC: 9.2 10*3/uL (ref 4.0–10.5)

## 2015-11-06 LAB — COMPREHENSIVE METABOLIC PANEL
ALT: 16 U/L — ABNORMAL LOW (ref 17–63)
AST: 17 U/L (ref 15–41)
Albumin: 3.6 g/dL (ref 3.5–5.0)
Alkaline Phosphatase: 67 U/L (ref 38–126)
Anion gap: 5 (ref 5–15)
BUN: 27 mg/dL — ABNORMAL HIGH (ref 6–20)
CO2: 26 mmol/L (ref 22–32)
Calcium: 9.2 mg/dL (ref 8.9–10.3)
Chloride: 107 mmol/L (ref 101–111)
Creatinine, Ser: 1.09 mg/dL (ref 0.61–1.24)
GFR calc Af Amer: >60 mL/min (ref >60)
GFR calc non Af Amer: >60 mL/min (ref >60)
Glucose, Bld: 128 mg/dL — ABNORMAL HIGH (ref 65–99)
Potassium: 4.4 mmol/L (ref 3.5–5.1)
Sodium: 138 mmol/L (ref 135–145)
Total Bilirubin: 0.5 mg/dL (ref 0.3–1.2)
Total Protein: 6.9 g/dL (ref 6.5–8.1)

## 2015-11-06 LAB — MAGNESIUM: Magnesium: 1.7 mg/dL (ref 1.7–2.4)

## 2015-11-06 NOTE — ED Notes (Signed)
Bed: WA08 Expected date:  Expected time:  Means of arrival:  Comments: 

## 2015-11-06 NOTE — ED Provider Notes (Signed)
Medical screening examination/treatment/procedure(s) were conducted as a shared visit with non-physician practitioner(s) and myself.  I personally evaluated the patient during the encounter.   EKG Interpretation   Date/Time:  Friday November 06 2015 10:52:14 EDT Ventricular Rate:  72 PR Interval:  152 QRS Duration: 100 QT Interval:  399 QTC Calculation: 437 R Axis:   57 Text Interpretation:  Sinus rhythm Baseline wander in lead(s) V6 No  significant change since last tracing Confirmed by Shown Dissinger  MD, Sina Sumpter  [phone]) on 11/06/2015 11:19:16 AM      Results for orders placed or performed during the hospital encounter of 11/06/15  Magnesium  Result Value Ref Range   Magnesium 1.7 1.7 - 2.4 mg/dL  CBC with Differential  Result Value Ref Range   WBC 9.2 4.0 - 10.5 K/uL   RBC 4.32 4.22 - 5.81 MIL/uL   Hemoglobin 13.3 13.0 - 17.0 g/dL   HCT 39.5 39.0 - 52.0 %   MCV 91.4 78.0 - 100.0 fL   MCH 30.8 26.0 - 34.0 pg   MCHC 33.7 30.0 - 36.0 g/dL   RDW 13.0 11.5 - 15.5 %   Platelets 189 150 - 400 K/uL   Neutrophils Relative % 72 %   Neutro Abs 6.7 1.7 - 7.7 K/uL   Lymphocytes Relative 17 %   Lymphs Abs 1.6 0.7 - 4.0 K/uL   Monocytes Relative 8 %   Monocytes Absolute 0.7 0.1 - 1.0 K/uL   Eosinophils Relative 2 %   Eosinophils Absolute 0.2 0.0 - 0.7 K/uL   Basophils Relative 1 %   Basophils Absolute 0.1 0.0 - 0.1 K/uL  Comprehensive metabolic panel  Result Value Ref Range   Sodium 138 135 - 145 mmol/L   Potassium 4.4 3.5 - 5.1 mmol/L   Chloride 107 101 - 111 mmol/L   CO2 26 22 - 32 mmol/L   Glucose, Bld 128 (H) 65 - 99 mg/dL   BUN 27 (H) 6 - 20 mg/dL   Creatinine, Ser 1.09 0.61 - 1.24 mg/dL   Calcium 9.2 8.9 - 10.3 mg/dL   Total Protein 6.9 6.5 - 8.1 g/dL   Albumin 3.6 3.5 - 5.0 g/dL   AST 17 15 - 41 U/L   ALT 16 (L) 17 - 63 U/L   Alkaline Phosphatase 67 38 - 126 U/L   Total Bilirubin 0.5 0.3 - 1.2 mg/dL   GFR calc non Af Amer >60 >60 mL/min   GFR calc Af Amer >60 >60 mL/min   Anion gap 5 5 - 15   Patient presenting complaint of tingling started the left side of head and cheek on Wednesday and then will get bouts of that tingling going through the lower part of his body. Felt that this was related to low magnesium in the past. Concerned that still low.  Patients of calcium and magnesium are normal as well as potassium. No significant electrolyte abnormalities. Explain the symptoms. Certainly not related to low magnesium. Patient does have primary care doctor for follow-up [facility].  Exam here today without any significant findings. No significant neuro deficits.  Fredia Sorrow, MD 11/06/15 1243

## 2015-11-06 NOTE — ED Notes (Signed)
Pt reports intermittent generalized numbness onset Wednesday; reports same symptoms and admission with diagnoses of low Mg. Pt reports diarrhea as well. Pt denies numbness/pain at present time. Neuro unremarkable.

## 2015-11-06 NOTE — Discharge Instructions (Signed)
Your labs today were normal. Please continue your magnesium and calcium supplements as prescribed. Please follow up with your primary care provider and endocrinologist. Return to the ER for new or worsening symptoms.

## 2015-11-06 NOTE — ED Provider Notes (Signed)
CSN: [id number]     Arrival date & time 11/06/15  1042 History   First MD Initiated Contact with Patient 11/06/15 1113     Chief Complaint  Patient presents with  . Numbness    HPI  Jared White is an 70 y.o. male with history of CAD, CHF, GERD, recent admission for hypomagnesemia and hypocalcemia who presents to the ED for evaluation of numbness. He states for the past few days he has had some tingling in his left face and then will sporadically feel a "large chill" wash across his body. He states that his feels similar though different when compared to how he felt prior to his recent admission. He states he is here as he is concerned his electrolytes are off again. He states he has been taking his magnesium, calcium, and potassium supplements as prescribed. He has a follow up with endocrinology in early July. He denies chest pain, SOB, n/v/d.   Past Medical History  Diagnosis Date  . Broken back   . Irregular heart beat   . GERD (gastroesophageal reflux disease)   . CHF (congestive heart failure) ([place])   . Hypertension   . CAD (coronary artery disease)     PCI & CABG  . Hyperlipidemia   . Systemic hypertension   . Diverticulitis     mild - approx 2004  . Family history of heart disease   . History of nuclear stress test 06/30/2011    lexiscan; normal pattern of perfusion post-stress; low risk scan    Past Surgical History  Procedure Laterality Date  . Lung lobectomy      left upper lobe  . Coronary artery bypass graft  01/2000    x5 - LIMA to LAD, SVG to diagonal, sequential SVG to ramus & OM, SVG to PDA (Dr. Tharon Aquas Trigt)  . Cardiac catheterization  08/1995    PTCA of OM (Dr. Marella Chimes)  . Transthoracic echocardiogram  12/24/2012    EF 55-60%, mild LVH, mild conc hypertrophy; mild MR; LA mildly dilated   Family History  Problem Relation Age of Onset  . Heart attack Mother   . Cancer Father   . Kidney failure Brother    Social History  Substance Use Topics  . Smoking  status: Never Smoker   . Smokeless tobacco: Never Used  . Alcohol Use: Yes     Comment: rarely    Review of Systems  All other systems reviewed and are negative.     Allergies  Review of patient's allergies indicates no known allergies.  Home Medications   Prior to Admission medications   Medication Sig Start Date End Date Taking? Authorizing Provider  amLODipine (NORVASC) 5 MG tablet TAKE ONE TABLET BY MOUTH ONE TIME DAILY 01/12/15   Pixie Casino, MD  aspirin 325 MG tablet Take 325 mg by mouth daily.    Historical Provider, MD  atenolol (TENORMIN) 50 MG tablet TAKE ONE TABLET BY MOUTH ONE TIME DAILY 04/10/15   Pixie Casino, MD  calcium carbonate (TUMS - DOSED IN MG ELEMENTAL CALCIUM) 500 MG chewable tablet Chew 1 tablet (200 mg of elemental calcium total) by mouth 3 (three) times daily with meals. 10/09/15   Domenic Polite, MD  clobetasol cream (TEMOVATE) [id number] % Apply 1 application topically 2 (two) times daily as needed. Patient taking differently: Apply 1 application topically 2 (two) times daily as needed (for rash).  11/21/13   Pixie Casino, MD  ergocalciferol (VITAMIN D2) 50000 units  capsule Take 1 capsule (50,000 Units total) by mouth once a week. 10/16/15   Chelle Jeffery, PA-C  fluticasone (FLONASE) 50 MCG/ACT nasal spray USE TWO SPRAYS IN [facility] NOSTRIL DAILY  04/24/14   Pixie Casino, MD  Magnesium Oxide 400 MG CAPS Take 1 capsule (400 mg total) by mouth 2 (two) times daily. 10/09/15   Domenic Polite, MD  omeprazole (PRILOSEC) 20 MG capsule TAKE ONE CAPSULE BY MOUTH ONE TIME DAILY 11/17/14   Pixie Casino, MD  potassium chloride SA (K-DUR,KLOR-CON) 20 MEQ tablet Take 2 tablets (40 mEq total) by mouth daily. 10/09/15   Domenic Polite, MD  simvastatin (ZOCOR) 80 MG tablet TAKE 1 TABLET BY MOUTH AT BEDTIME. Patient taking differently: TAKE 1 TABLET BY MOUTH AT daily 04/10/15   Pixie Casino, MD  valsartan (DIOVAN) 160 MG tablet TAKE 1 TABLET BY MOUTH ONE TIME DAILY.  11/17/14   Pixie Casino, MD   BP 161/79 mmHg  Pulse 69  Temp(Src) 98.4 F (36.9 C) (Oral)  Resp 14  Ht 5\' 9"  (1.753 m)  Wt 81.647 kg  BMI 26.57 kg/m2  SpO2 97% Physical Exam  Constitutional: He is oriented to person, place, and time.  HENT:  Right Ear: External ear normal.  Left Ear: External ear normal.  Nose: Nose normal.  Mouth/Throat: Oropharynx is clear and moist. No oropharyngeal exudate.  Negative chvostek  Eyes: Conjunctivae and EOM are normal. Pupils are equal, round, and reactive to light.  Neck: Normal range of motion. Neck supple.  Cardiovascular: Normal rate, regular rhythm, normal heart sounds and intact distal pulses.   Pulmonary/Chest: Effort normal and breath sounds normal. No respiratory distress. He has no wheezes. He exhibits no tenderness.  Abdominal: Soft. Bowel sounds are normal. He exhibits no distension. There is no tenderness. There is no rebound and no guarding.  Musculoskeletal: He exhibits no edema.  Neurological: He is alert and oriented to person, place, and time. No cranial nerve deficit.  Normal finger to nose No pronator drift No tremor  Skin: Skin is warm and dry.  Psychiatric: He has a normal mood and affect.  Nursing note and vitals reviewed.   ED Course  Procedures (including critical care time) Labs Review Labs Reviewed  COMPREHENSIVE METABOLIC PANEL - Abnormal; Notable for the following:    Glucose, Bld 128 (*)    BUN 27 (*)    ALT 16 (*)    All other components within normal limits  MAGNESIUM  CBC WITH DIFFERENTIAL/PLATELET    Imaging Review No results found. I have personally reviewed and evaluated these images and lab results as part of my medical decision-making.   EKG Interpretation   Date/Time:  Friday November 06 2015 10:52:14 EDT Ventricular Rate:  72 PR Interval:  152 QRS Duration: 100 QT Interval:  399 QTC Calculation: 437 R Axis:   57 Text Interpretation:  Sinus rhythm Baseline wander in lead(s) V6 No   significant change since last tracing Confirmed by ZACKOWSKI  MD, SCOTT  ([id number]) on 11/06/2015 11:19:16 AM      MDM   Final diagnoses:  Numbness and tingling    CBC, CMP, and mag unremarkable. Exam reassuring. Instructed close f/u with PCP and endo. In the meantime continue supplements as prescribed. ER return precautions given.    Anne Ng, [place] 11/06/15 Vernelle Emerald

## 2015-12-07 ENCOUNTER — Other Ambulatory Visit: Payer: Self-pay | Admitting: Internal Medicine

## 2015-12-07 DIAGNOSIS — N289 Disorder of kidney and ureter, unspecified: Secondary | ICD-10-CM

## 2015-12-16 ENCOUNTER — Ambulatory Visit
Admission: RE | Admit: 2015-12-16 | Discharge: 2015-12-16 | Disposition: A | Discharge status: Home / Self Care | Payer: Medicare Other | Source: Ambulatory Visit | Attending: Internal Medicine | Admitting: Internal Medicine

## 2015-12-16 DIAGNOSIS — N289 Disorder of kidney and ureter, unspecified: Secondary | ICD-10-CM

## 2015-12-16 IMAGING — US US RENAL ARTERY STENOSIS
1 series · 13 of 25 positions shown · non-contrast
Comparison: None.

CLINICAL DATA: Renal insufficiency.

EXAM:
RENAL/URINARY TRACT ULTRASOUND
RENAL DUPLEX DOPPLER ULTRASOUND

[Series 1: us renal artery stenosis · 0.43mm/px · 13 of 77 slices shown]
[im 1/77]
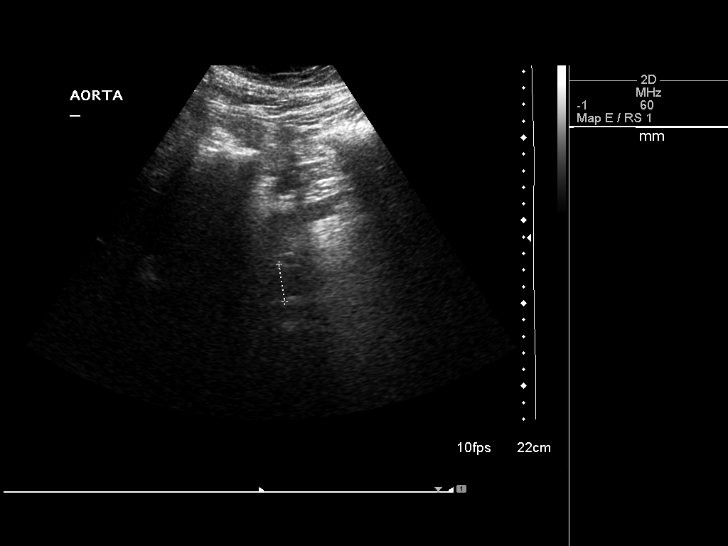
[im 7/77]
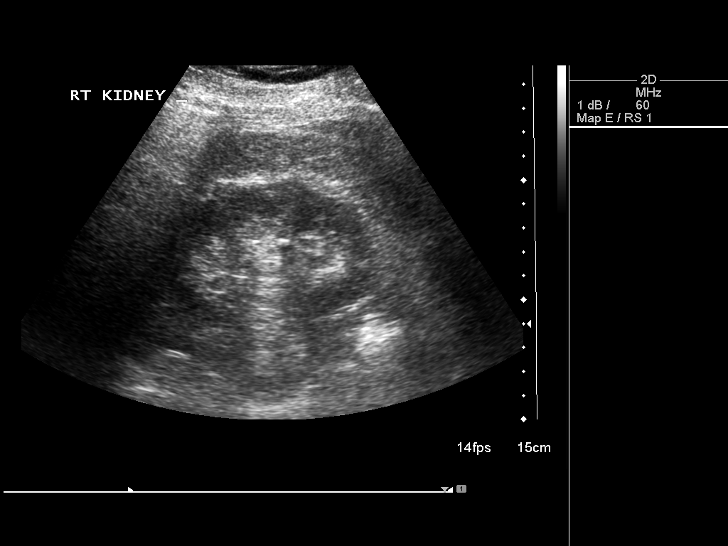
[im 13/77]
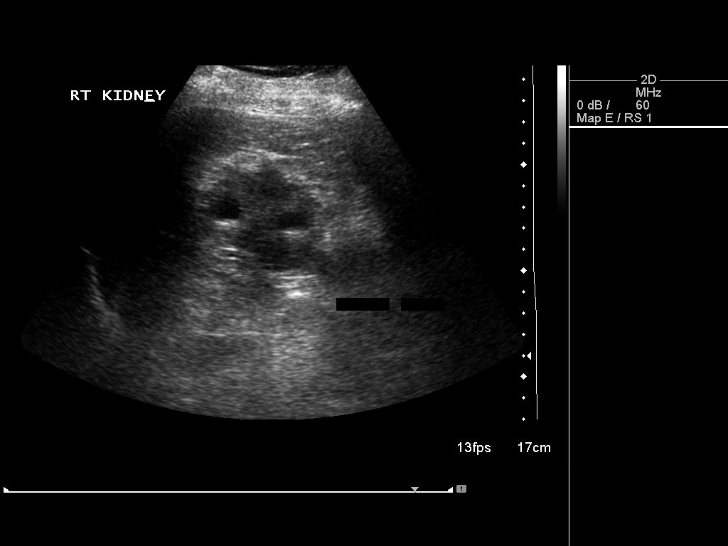
[im 20/77]
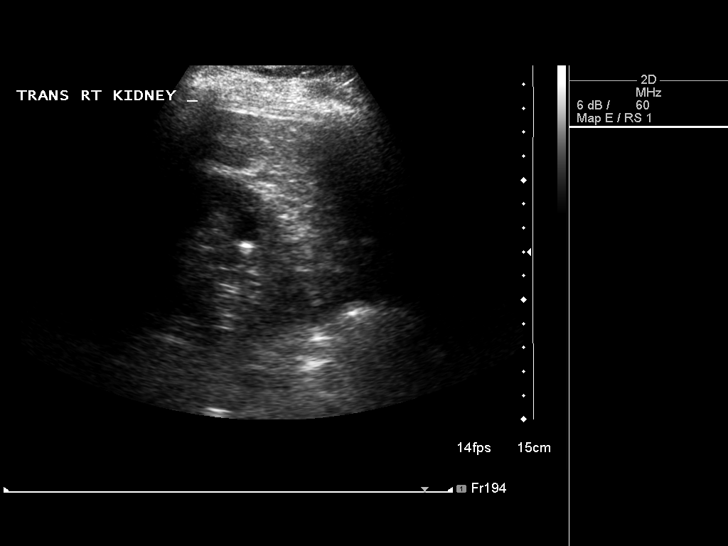
[im 26/77]
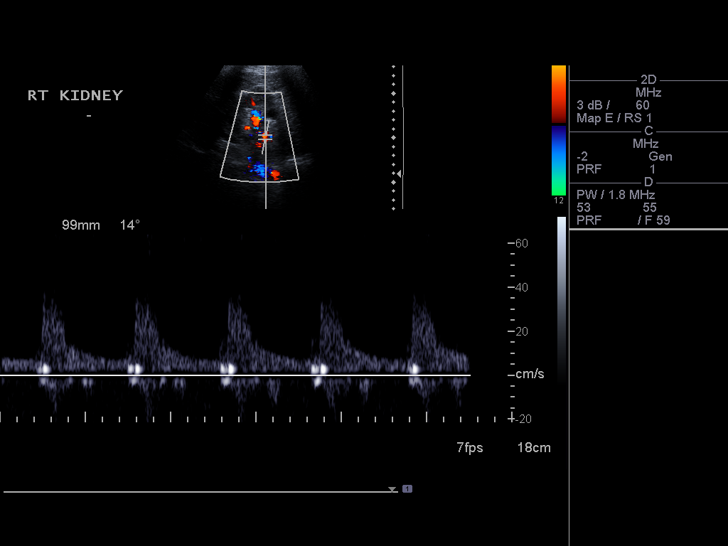
[im 32/77]
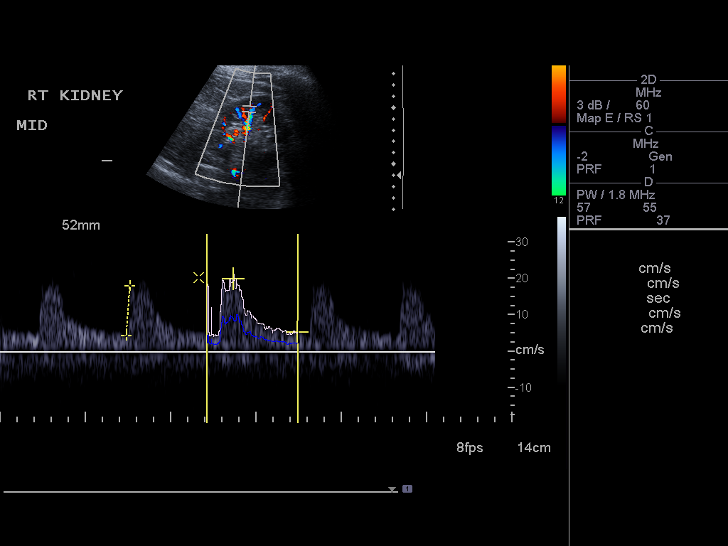
[im 39/77]
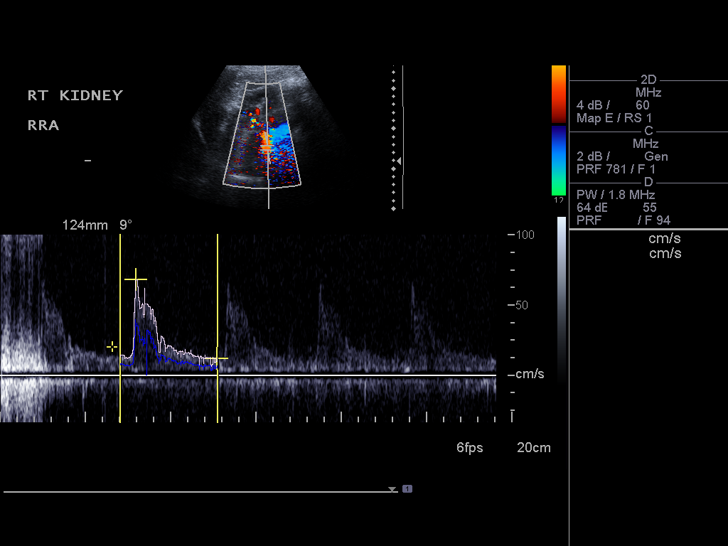
[im 45/77]
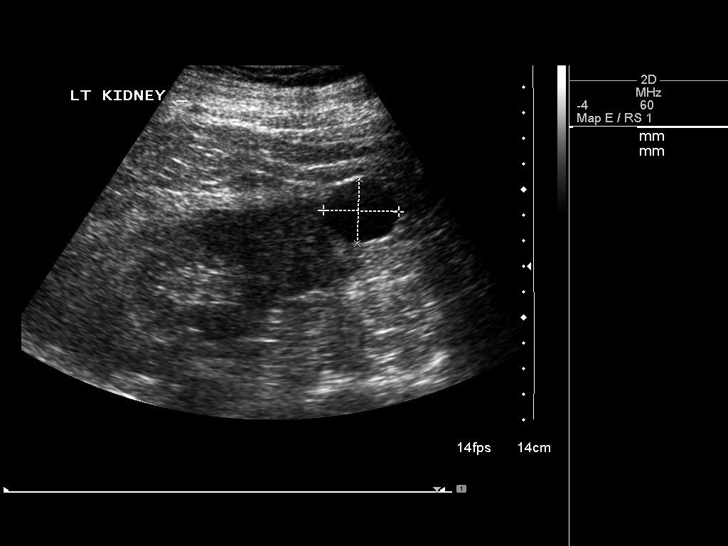
[im 51/77]
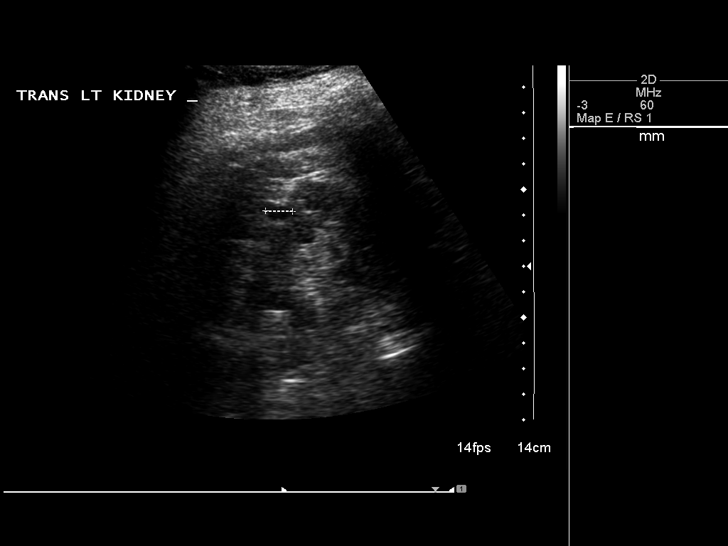
[im 58/77]
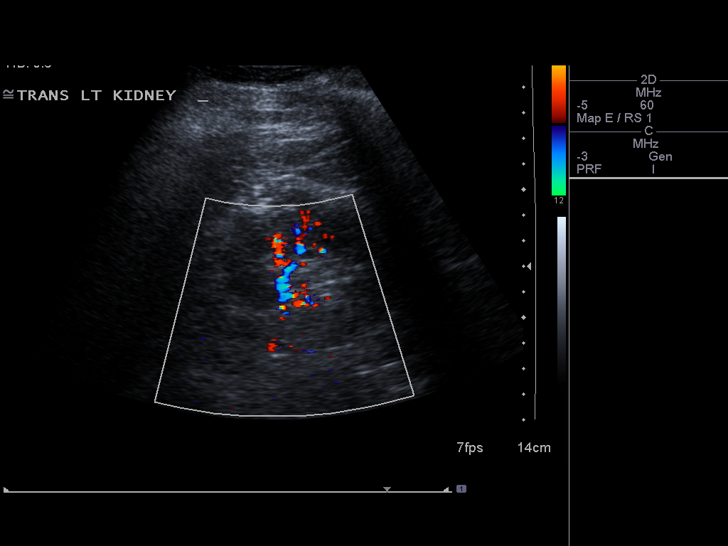
[im 64/77]
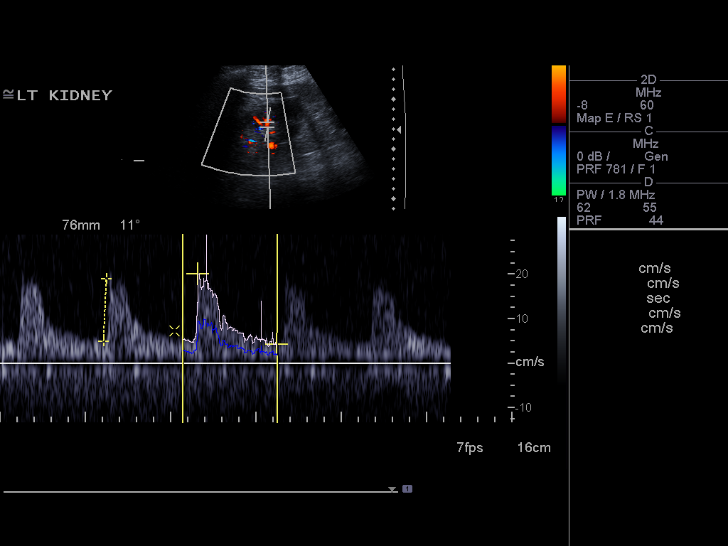
[im 70/77]
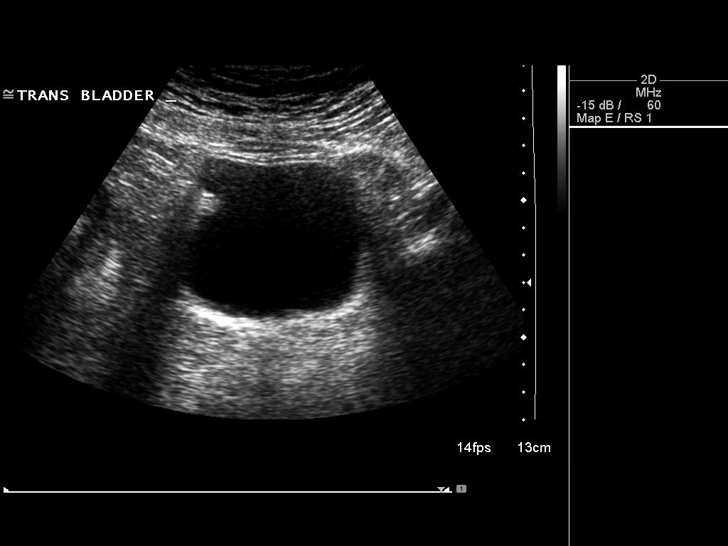
[im 77/77]
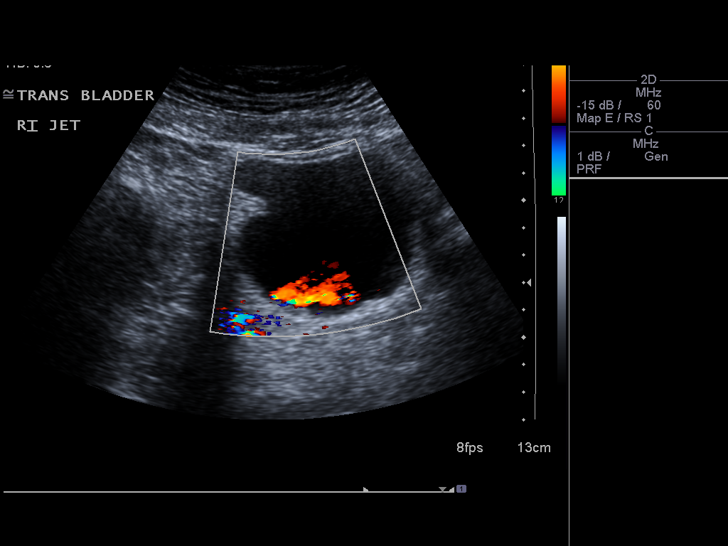

[13 of 25 positions shown; findings below may reference images not displayed]

FINDINGS: Right Kidney:

Length: 10.8 cm. Multiple cysts are noted with the largest measuring
1.4 cm. Echogenicity within normal limits. No mass or hydronephrosis
visualized.

Left Kidney:

Length: 11 cm. Multiple cysts are noted with the largest measuring 3
cm. Echogenicity within normal limits. No mass or hydronephrosis
visualized.

Bladder: Appears to be normal for degree of distension. Bilateral
ureteral jets noted. Mild prostatic enlargement is noted.

RENAL DUPLEX ULTRASOUND

Right Renal Artery Velocities:

Origin:  68 cm/sec

Mid:  78 cm/sec

Hilum:  56 cm/sec

Interlobar:  25 cm/sec

Arcuate:  20 Cm/sec

Left Renal Artery Velocities:

Origin:  46 cm/sec

Mid:  80 cm/sec

Hilum:  40 cm/sec

Interlobar:  25 cm/sec

Arcuate:  16 cm/sec

Aortic Velocity:  63 Cm/sec

Right Renal-Aortic Ratios:

Origin:

Mid:

Hilum:

Interlobar:

Arcuate:

Left Renal-Aortic Ratios:

Origin:

Mid:

Hilum:

Interlobar:

Arcuate:

Renal veins are widely patent bilaterally.
IMPRESSION: Bilateral simple renal cysts. No hydronephrosis or renal obstruction
is noted. Mild prostatic enlargement is noted.

No Doppler evidence of renal artery stenosis.

## 2016-01-07 ENCOUNTER — Telehealth: Payer: Self-pay

## 2016-01-07 NOTE — Telephone Encounter (Signed)
Dr Carlota Raspberry asked me to make sure that pt comes in to see him to discuss US renal findings of kidney cysts, enlarged prostate and decreased kidney function. Called pt who agreed to set up appt and transferred him to clerical to sch. Marked the [place] report for scanning.

## 2016-01-09 ENCOUNTER — Encounter: Payer: Self-pay | Admitting: Family Medicine

## 2016-01-09 ENCOUNTER — Ambulatory Visit (INDEPENDENT_AMBULATORY_CARE_PROVIDER_SITE_OTHER): Payer: Medicare Other | Admitting: Family Medicine

## 2016-01-09 DIAGNOSIS — R319 Hematuria, unspecified: Secondary | ICD-10-CM | POA: Diagnosis not present

## 2016-01-09 DIAGNOSIS — Q6102 Congenital multiple renal cysts: Secondary | ICD-10-CM

## 2016-01-09 DIAGNOSIS — N281 Cyst of kidney, acquired: Secondary | ICD-10-CM

## 2016-01-09 DIAGNOSIS — N400 Enlarged prostate without lower urinary tract symptoms: Secondary | ICD-10-CM | POA: Diagnosis not present

## 2016-01-09 DIAGNOSIS — Z125 Encounter for screening for malignant neoplasm of prostate: Secondary | ICD-10-CM

## 2016-01-09 DIAGNOSIS — M545 Low back pain, unspecified: Secondary | ICD-10-CM

## 2016-01-09 LAB — POCT URINALYSIS DIP (MANUAL ENTRY)
Bilirubin, UA: NEGATIVE
Glucose, UA: NEGATIVE
Leukocytes, UA: NEGATIVE
Nitrite, UA: NEGATIVE
Protein Ur, POC: 100 — AB
Spec Grav, UA: 1.02
Urobilinogen, UA: 0.2
pH, UA: 5

## 2016-01-09 LAB — COMPLETE METABOLIC PANEL WITH GFR
ALT: 16 U/L (ref 9–46)
AST: 15 U/L (ref 10–35)
Albumin: 4.1 g/dL (ref 3.6–5.1)
Alkaline Phosphatase: 61 U/L (ref 40–115)
BUN: 38 mg/dL — ABNORMAL HIGH (ref 7–25)
CO2: 23 mmol/L (ref 20–31)
Calcium: 9.4 mg/dL (ref 8.6–10.3)
Chloride: 105 mmol/L (ref 98–110)
Creat: 1.79 mg/dL — ABNORMAL HIGH (ref 0.70–1.25)
GFR, Est African American: 44 mL/min — ABNORMAL LOW (ref >=60)
GFR, Est Non African American: 38 mL/min — ABNORMAL LOW (ref >=60)
Glucose, Bld: 126 mg/dL — ABNORMAL HIGH (ref 65–99)
Potassium: 4.5 mmol/L (ref 3.5–5.3)
Sodium: 138 mmol/L (ref 135–146)
Total Bilirubin: 0.5 mg/dL (ref 0.2–1.2)
Total Protein: 7 g/dL (ref 6.1–8.1)

## 2016-01-09 LAB — PSA: PSA: 1.9 ng/mL (ref <=4.0)

## 2016-01-09 LAB — MAGNESIUM: Magnesium: 1.8 mg/dL (ref 1.5–2.5)

## 2016-01-09 LAB — POC MICROSCOPIC URINALYSIS (UMFC)

## 2016-01-09 NOTE — Progress Notes (Signed)
Subjective:  By signing my name below, I, Raven Small, attest that this documentation has been prepared under the direction and in the presence of [name], MD.  Electronically Signed: Thea Alken, ED Scribe. 01/09/2016. 10:31 AM.   Patient ID: Jared White, male    DOB: 05-Sep-1945, 70 y.o.   MRN: [id number]  HPI Chief Complaint  Patient presents with  . Follow-up    Kidney Pain, Hospital Follow up  . Referral    [facility]  . Labs Only    Recheck Magnesium, Calcium levels    HPI Comments: Jared White is a 70 y.o. male who presents to the [facility] for a follow up. Hx of multiple medical problems including CAD, HTN, low calcium, low magnesium. He was seen by Dr. Laney Pastor in May and noted to have markedly low calcium. At that time his initially calcium was 6.4 and also noted to have low magnesium as cause. He was treated with IV and PO. At that time he was symptomatic with paraesthesia  He was seen in follow up 5/26. At the time of discharge calcium was 7.6. He has a follow up with Dr. Buddy Duty, it was scheduled. His calcium on 5/26 was 9.5, magnesium was 1.5. He was seen again 6/16 for paresthesia but had normal CMP and magnesium. He is here for follow, as recent f/u with Dr. Buddy Duty indicated multiple renal cyst, mild prostate enlargement, and noted to have decreased kidney function. The creatinine we have here is normal.   Pt has been taking pepsid but still takes ppi once a weeks for break through symptoms. Pt reports very minimal facial tingling but not as bad a previous spells. He does note tingling to the back of head when urinating. He is able to   Enlarged prostate Pt wakes up 0-1 times at night to urinate. He has not had prostate checked in several years. He reports family hx of prostate cancer in his brother.   Lab Results  Component Value Date   PSA 1.66 11/21/2013    Right low back pain. He complains of right low back pain for the past month.  Nothing worsens pain. He has not tried at home treatments. He reports hx of back fracture.   Pt would like a referral to nephrologist for cyst of kidney.  Patient Active Problem List   Diagnosis Date Noted  . Microalbuminuria 10/16/2015  . Facial numbness   . Hypomagnesemia   . Hypocalcemia 10/08/2015  . Psoriasis 11/21/2013  . S/P CABG x 4 03/14/2013  . Multiple thoracic/lumbar transverse process fractures 12/15/2012  . Fall from roof 12/15/2012  . CAD (coronary artery disease) 12/15/2012  . GERD (gastroesophageal reflux disease) 12/15/2012  . HTN (hypertension) 12/15/2012  . Hyperlipidemia 12/15/2012  . Multiple bilateral rib fractures    Past Medical History:  Diagnosis Date  . Broken back   . CAD (coronary artery disease)    PCI & CABG  . CHF (congestive heart failure) ([place])   . Diverticulitis    mild - approx 2004  . Family history of heart disease   . GERD (gastroesophageal reflux disease)   . History of nuclear stress test 06/30/2011   lexiscan; normal pattern of perfusion post-stress; low risk scan   . Hyperlipidemia   . Hypertension   . Irregular heart beat   . Systemic hypertension    Past Surgical History:  Procedure Laterality Date  . CARDIAC CATHETERIZATION  08/1995   PTCA of OM (  Dr. Marella Chimes)  . CORONARY ARTERY BYPASS GRAFT  01/2000   x5 - LIMA to LAD, SVG to diagonal, sequential SVG to ramus & OM, SVG to PDA (Dr. Tharon Aquas Trigt)  . LUNG LOBECTOMY     left upper lobe  . TRANSTHORACIC ECHOCARDIOGRAM  12/24/2012   EF 55-60%, mild LVH, mild conc hypertrophy; mild MR; LA mildly dilated   No Known Allergies Prior to Admission medications   Medication Sig Start Date End Date Taking? Authorizing Provider  amLODipine (NORVASC) 5 MG tablet TAKE ONE TABLET BY MOUTH ONE TIME DAILY 01/12/15  Yes Pixie Casino, MD  aspirin 325 MG tablet Take 325 mg by mouth daily.   Yes Historical Provider, MD  atenolol (TENORMIN) 50 MG tablet TAKE ONE TABLET BY MOUTH ONE TIME  DAILY 04/10/15  Yes Pixie Casino, MD  calcium carbonate (TUMS - DOSED IN MG ELEMENTAL CALCIUM) 500 MG chewable tablet Chew 1 tablet (200 mg of elemental calcium total) by mouth 3 (three) times daily with meals. 10/09/15  Yes Domenic Polite, MD  clobetasol cream (TEMOVATE) [id number] % Apply 1 application topically 2 (two) times daily as needed. Patient taking differently: Apply 1 application topically 2 (two) times daily as needed (for rash).  11/21/13  Yes Pixie Casino, MD  ergocalciferol (VITAMIN D2) 50000 units capsule Take 1 capsule (50,000 Units total) by mouth once a week. Patient taking differently: Take 50,000 Units by mouth once a week. Friday. 10/16/15  Yes Chelle Jeffery, PA-C  FAMOTIDINE PO Take 1 tablet by mouth 2 (two) times daily.   Yes Historical Provider, MD  fluticasone (FLONASE) 50 MCG/ACT nasal spray USE TWO SPRAYS IN EACH NOSTRIL DAILY  04/24/14  Yes Pixie Casino, MD  Magnesium Oxide 400 MG CAPS Take 1 capsule (400 mg total) by mouth 2 (two) times daily. 10/09/15  Yes Domenic Polite, MD  MAGNESIUM-OXIDE 400 (241.3 Mg) MG tablet Take 1 tablet by mouth 2 (two) times daily. 10/09/15  Yes Historical Provider, MD  potassium chloride SA (K-DUR,KLOR-CON) 20 MEQ tablet Take 2 tablets (40 mEq total) by mouth daily. 10/09/15  Yes Domenic Polite, MD  simvastatin (ZOCOR) 80 MG tablet TAKE 1 TABLET BY MOUTH AT BEDTIME. 04/10/15  Yes Pixie Casino, MD  valsartan (DIOVAN) 160 MG tablet TAKE 1 TABLET BY MOUTH ONE TIME DAILY. 11/17/14  Yes Pixie Casino, MD  omeprazole (PRILOSEC) 20 MG capsule TAKE ONE CAPSULE BY MOUTH ONE TIME DAILY Patient not taking: Reported on 11/06/2015 11/17/14   Pixie Casino, MD   Social History   Social History  . Marital status: Single    Spouse name: n/a  . Number of children: 0  . Years of education: N/A   Occupational History  . Chief Strategy Officer, [employer]    Social History Main Topics  . Smoking status: Never Smoker  . Smokeless tobacco: Never Used    . Alcohol use Yes     Comment: rarely  . Drug use: No  . Sexual activity: Not on file   Other Topics Concern  . Not on file   Social History Narrative   Lives alone.   Sister-in-Law lives next door.   Review of Systems  Constitutional: Negative for chills and fever.  Musculoskeletal: Positive for back pain and myalgias.  Neurological: Negative for weakness and numbness.       Objective:   Physical Exam  Constitutional: He is oriented to person, place, and time. He appears well-developed and well-nourished. No distress.  HENT:  Head: Normocephalic and atraumatic.  Eyes: Conjunctivae and EOM are normal.  Neck: Neck supple.  Cardiovascular: Normal rate, regular rhythm and normal heart sounds.  Exam reveals no gallop and no friction rub.   No murmur heard. Pulmonary/Chest: Effort normal and breath sounds normal. He has no wheezes. He has no rales.  Abdominal: Soft. There is no tenderness. There is no CVA tenderness.  Genitourinary:  Genitourinary Comments: Somewhat enlarged. non tender no apparent nodules.   Musculoskeletal: Normal range of motion.  Tender right lower para. No midline tenderness.   Neurological: He is alert and oriented to person, place, and time.  Reflex Scores:      Bicep reflexes are 2+ on the right side and 2+ on the left side.      Patellar reflexes are 2+ on the right side and 2+ on the left side. No clonus   Skin: Skin is warm and dry.  Psychiatric: He has a normal mood and affect. His behavior is normal.  Nursing note and vitals reviewed.  Vitals:   01/09/16 1016  BP: 136/70  Pulse: 60  Resp: 16  Temp: 98.2 F (36.8 C)  TempSrc: Oral  SpO2: 97%  Weight: 179 lb 3.2 oz (81.3 kg)  Height: 5\' 9"  (1.753 m)   Results for orders placed or performed in visit on 01/09/16  POCT urinalysis dipstick  Result Value Ref Range   Color, UA yellow yellow   Clarity, UA clear clear   Glucose, UA negative negative   Bilirubin, UA negative negative    Ketones, POC UA trace (5) (A) negative   Spec Grav, UA 1.020    Blood, UA large (A) negative   pH, UA 5.0    Protein Ur, POC =100 (A) negative   Urobilinogen, UA 0.2    Nitrite, UA Negative Negative   Leukocytes, UA Negative Negative  POCT Microscopic Urinalysis ([facility])  Result Value Ref Range   WBC,UR,HPF,POC None None WBC/hpf   RBC,UR,HPF,POC Moderate (A) None RBC/hpf   Bacteria Few (A) None, Too numerous to count   Mucus Present (A) Absent   Epithelial Cells, UR Per Microscopy None None, Too numerous to count cells/hpf  Renal US 12/16/15: IMPRESSION: Bilateral simple renal cysts. No hydronephrosis or renal obstruction is noted. Mild prostatic enlargement is noted. No Doppler evidence of renal artery stenosis. Electronically Signed   By: Marijo Conception, M.D.   On: 12/16/2015 13:32  Assessment & Plan:   Jared White is a 70 y.o. male Hypocalcemia - Plan: COMPLETE METABOLIC PANEL WITH GFR, Ambulatory referral to Nephrology Hypomagnesemia - Plan: Magnesium, Ambulatory referral to Nephrology  - Repeat calcium and magnesium level. Continue supplementation.  -Refer to nephrology for further evaluation, and maintain follow-up with endocrinology.  Bilateral renal cysts - Plan: POCT urinalysis dipstick, POCT Microscopic Urinalysis ([facility]), Ambulatory referral to Nephrology Enlarged prostate - Plan: PSA, Medicare, POCT urinalysis dipstick, POCT Microscopic Urinalysis ([facility]) Hematuria - Plan: Urine culture  - bilateral renal cysts noted on ultrasound, microscopic/asymptomatic hematuria.   -Will refer to nephrology for cysts as well as calcium and magnesium as above.   -Check PSA, and consider urology evaluation if elevated PSA, along with the hematuria. Possible prostate enlargement on ultrasound may be due to BPH.  Special screening, prostate cancer - Plan: PSA, Medicare  - PSA obtained. Family history of prostate cancer.  Right-sided low back pain without sciatica  -History of  lumbar spine issues. Appears to be musculoskeletal in nature, Tylenol, symptomatic care discussed, RTC precautions if  persistent as may need updated imaging. Consider PT/Ortho eval if persistent.    No orders of the defined types were placed in this encounter.  Patient Instructions    Your back pain appears to be due to muscle, not kidneys. You can try Tylenol, heat or ice to the sore areas, and if not improving within the next week or 2, return to discuss this further and possible x-rays.  I will check a prostate test, but if this level is elevated, would recommend follow-up with urology.  I referred you to a kidney specialist to discuss the cysts on the kidneys as well as your calcium and magnesium. I did recheck those labs today. Additionally you did have some blood in the urine, and this can also be discussed with the nephrologist. If your PSA or prostate test is elevated, I may also refer you to urology to look into other causes. If any new urinary symptoms, return right away.  Return to the clinic or go to the nearest emergency room if any of your symptoms worsen or new symptoms occur.   Back Pain, Adult Back pain is very common in adults.The cause of back pain is rarely dangerous and the pain often gets better over time.The cause of your back pain may not be known. Some common causes of back pain include:  Strain of the muscles or ligaments supporting the spine.  Wear and tear (degeneration) of the spinal disks.  Arthritis.  Direct injury to the back. For many people, back pain may return. Since back pain is rarely dangerous, most people can learn to manage this condition on their own. HOME CARE INSTRUCTIONS Watch your back pain for any changes. The following actions may help to lessen any discomfort you are feeling:  Remain active. It is stressful on your back to sit or stand in one place for long periods of time. Do not sit, drive, or stand in one place for more than 30  minutes at a time. Take short walks on even surfaces as soon as you are able.Try to increase the length of time you walk each day.  Exercise regularly as directed by your health care provider. Exercise helps your back heal faster. It also helps avoid future injury by keeping your muscles strong and flexible.  Do not stay in bed.Resting more than 1-2 days can delay your recovery.  Pay attention to your body when you bend and lift. The most comfortable positions are those that put less stress on your recovering back. Always use proper lifting techniques, including:  Bending your knees.  Keeping the load close to your body.  Avoiding twisting.  Find a comfortable position to sleep. Use a firm mattress and lie on your side with your knees slightly bent. If you lie on your back, put a pillow under your knees.  Avoid feeling anxious or stressed.Stress increases muscle tension and can worsen back pain.It is important to recognize when you are anxious or stressed and learn ways to manage it, such as with exercise.  Take medicines only as directed by your health care provider. Over-the-counter medicines to reduce pain and inflammation are often the most helpful.Your health care provider may prescribe muscle relaxant drugs.These medicines help dull your pain so you can more quickly return to your normal activities and healthy exercise.  Apply ice to the injured area:  Put ice in a plastic bag.  Place a towel between your skin and the bag.  Leave the ice on for 20 minutes, 2-3  times a day for the first 2-3 days. After that, ice and heat may be alternated to reduce pain and spasms.  Maintain a healthy weight. Excess weight puts extra stress on your back and makes it difficult to maintain good posture. SEEK MEDICAL CARE IF:  You have pain that is not relieved with rest or medicine.  You have increasing pain going down into the legs or buttocks.  You have pain that does not improve in one  week.  You have night pain.  You lose weight.  You have a fever or chills. SEEK IMMEDIATE MEDICAL CARE IF:   You develop new bowel or bladder control problems.  You have unusual weakness or numbness in your arms or legs.  You develop nausea or vomiting.  You develop abdominal pain.  You feel faint.   This information is not intended to replace advice given to you by your health care provider. Make sure you discuss any questions you have with your health care provider.   Document Released: 05/09/2005 Document Revised: 05/30/2014 Document Reviewed: 09/10/2013 Elsevier Interactive Patient Education 2016 [employer].    IF you received an x-ray today, you will receive an invoice from [facility]. Please contact [facility] at [phone] with questions or concerns regarding your invoice.   IF you received labwork today, you will receive an invoice from [employer]. Please contact [facility] at [phone] with questions or concerns regarding your invoice.   Our billing staff will not be able to assist you with questions regarding bills from these companies.  You will be contacted with the lab results as soon as they are available. The fastest way to get your results is to activate your My Chart account. Instructions are located on the last page of this paperwork. If you have not heard from [place] regarding the results in 2 weeks, please contact this office.       I personally performed the services described in this documentation, which was scribed in my presence. The recorded information has been reviewed and considered, and addended by me as needed.   Signed,   [name], MD [facility].  01/09/16 12:22 PM

## 2016-01-09 NOTE — Patient Instructions (Addendum)
Your back pain appears to be due to muscle, not kidneys. You can try Tylenol, heat or ice to the sore areas, and if not improving within the next week or 2, return to discuss this further and possible x-rays.  I will check a prostate test, but if this level is elevated, would recommend follow-up with urology.  I referred you to a kidney specialist to discuss the cysts on the kidneys as well as your calcium and magnesium. I did recheck those labs today. Additionally you did have some blood in the urine, and this can also be discussed with the nephrologist. If your PSA or prostate test is elevated, I may also refer you to urology to look into other causes. If any new urinary symptoms, return right away.  Return to the clinic or go to the nearest emergency room if any of your symptoms worsen or new symptoms occur.   Back Pain, Adult Back pain is very common in adults.The cause of back pain is rarely dangerous and the pain often gets better over time.The cause of your back pain may not be known. Some common causes of back pain include:  Strain of the muscles or ligaments supporting the spine.  Wear and tear (degeneration) of the spinal disks.  Arthritis.  Direct injury to the back. For many people, back pain may return. Since back pain is rarely dangerous, most people can learn to manage this condition on their own. HOME CARE INSTRUCTIONS Watch your back pain for any changes. The following actions may help to lessen any discomfort you are feeling:  Remain active. It is stressful on your back to sit or stand in one place for long periods of time. Do not sit, drive, or stand in one place for more than 30 minutes at a time. Take short walks on even surfaces as soon as you are able.Try to increase the length of time you walk each day.  Exercise regularly as directed by your health care provider. Exercise helps your back heal faster. It also helps avoid future injury by keeping your muscles  strong and flexible.  Do not stay in bed.Resting more than 1-2 days can delay your recovery.  Pay attention to your body when you bend and lift. The most comfortable positions are those that put less stress on your recovering back. Always use proper lifting techniques, including:  Bending your knees.  Keeping the load close to your body.  Avoiding twisting.  Find a comfortable position to sleep. Use a firm mattress and lie on your side with your knees slightly bent. If you lie on your back, put a pillow under your knees.  Avoid feeling anxious or stressed.Stress increases muscle tension and can worsen back pain.It is important to recognize when you are anxious or stressed and learn ways to manage it, such as with exercise.  Take medicines only as directed by your health care provider. Over-the-counter medicines to reduce pain and inflammation are often the most helpful.Your health care provider may prescribe muscle relaxant drugs.These medicines help dull your pain so you can more quickly return to your normal activities and healthy exercise.  Apply ice to the injured area:  Put ice in a plastic bag.  Place a towel between your skin and the bag.  Leave the ice on for 20 minutes, 2-3 times a day for the first 2-3 days. After that, ice and heat may be alternated to reduce pain and spasms.  Maintain a healthy weight. Excess weight puts extra stress  on your back and makes it difficult to maintain good posture. SEEK MEDICAL CARE IF:  You have pain that is not relieved with rest or medicine.  You have increasing pain going down into the legs or buttocks.  You have pain that does not improve in one week.  You have night pain.  You lose weight.  You have a fever or chills. SEEK IMMEDIATE MEDICAL CARE IF:   You develop new bowel or bladder control problems.  You have unusual weakness or numbness in your arms or legs.  You develop nausea or vomiting.  You develop abdominal  pain.  You feel faint.   This information is not intended to replace advice given to you by your health care provider. Make sure you discuss any questions you have with your health care provider.   Document Released: 05/09/2005 Document Revised: 05/30/2014 Document Reviewed: 09/10/2013 Elsevier Interactive Patient Education 2016 [employer].    IF you received an x-ray today, you will receive an invoice from [facility]. Please contact [facility] at [phone] with questions or concerns regarding your invoice.   IF you received labwork today, you will receive an invoice from [employer]. Please contact [facility] at [phone] with questions or concerns regarding your invoice.   Our billing staff will not be able to assist you with questions regarding bills from these companies.  You will be contacted with the lab results as soon as they are available. The fastest way to get your results is to activate your My Chart account. Instructions are located on the last page of this paperwork. If you have not heard from [place] regarding the results in 2 weeks, please contact this office.

## 2016-01-10 LAB — URINE CULTURE: Organism ID, Bacteria: 10000

## 2016-01-13 ENCOUNTER — Other Ambulatory Visit: Payer: Self-pay | Admitting: Family Medicine

## 2016-01-13 DIAGNOSIS — R319 Hematuria, unspecified: Secondary | ICD-10-CM

## 2016-01-21 ENCOUNTER — Ambulatory Visit (INDEPENDENT_AMBULATORY_CARE_PROVIDER_SITE_OTHER): Payer: Medicare Other | Admitting: Family Medicine

## 2016-01-21 VITALS — BP 130/72 | HR 63 | Temp 98.4°F | Resp 18 | Ht 67.72 in | Wt 180.8 lb

## 2016-01-21 DIAGNOSIS — Z23 Encounter for immunization: Secondary | ICD-10-CM | POA: Diagnosis not present

## 2016-01-21 DIAGNOSIS — I2584 Coronary atherosclerosis due to calcified coronary lesion: Secondary | ICD-10-CM | POA: Diagnosis not present

## 2016-01-21 DIAGNOSIS — Z951 Presence of aortocoronary bypass graft: Secondary | ICD-10-CM | POA: Diagnosis not present

## 2016-01-21 DIAGNOSIS — I2510 Atherosclerotic heart disease of native coronary artery without angina pectoris: Secondary | ICD-10-CM | POA: Diagnosis not present

## 2016-01-21 DIAGNOSIS — K219 Gastro-esophageal reflux disease without esophagitis: Secondary | ICD-10-CM | POA: Diagnosis not present

## 2016-01-21 DIAGNOSIS — N289 Disorder of kidney and ureter, unspecified: Secondary | ICD-10-CM | POA: Diagnosis not present

## 2016-01-21 LAB — POCT URINALYSIS DIP (MANUAL ENTRY)
Bilirubin, UA: NEGATIVE
Glucose, UA: NEGATIVE
Ketones, POC UA: NEGATIVE
Leukocytes, UA: NEGATIVE
Nitrite, UA: NEGATIVE
Protein Ur, POC: 100 — AB
Spec Grav, UA: 1.02
Urobilinogen, UA: 0.2
pH, UA: 5

## 2016-01-21 LAB — COMPREHENSIVE METABOLIC PANEL
ALT: 19 U/L (ref 9–46)
AST: 14 U/L (ref 10–35)
Albumin: 4 g/dL (ref 3.6–5.1)
Alkaline Phosphatase: 55 U/L (ref 40–115)
BUN: 28 mg/dL — ABNORMAL HIGH (ref 7–25)
CO2: 25 mmol/L (ref 20–31)
Calcium: 9.3 mg/dL (ref 8.6–10.3)
Chloride: 106 mmol/L (ref 98–110)
Creat: 1.61 mg/dL — ABNORMAL HIGH (ref 0.70–1.25)
Glucose, Bld: 197 mg/dL — ABNORMAL HIGH (ref 65–99)
Potassium: 4.6 mmol/L (ref 3.5–5.3)
Sodium: 140 mmol/L (ref 135–146)
Total Bilirubin: 0.3 mg/dL (ref 0.2–1.2)
Total Protein: 6.8 g/dL (ref 6.1–8.1)

## 2016-01-21 LAB — POC MICROSCOPIC URINALYSIS (UMFC): Mucus: ABSENT

## 2016-01-21 MED ORDER — FAMOTIDINE 20 MG PO TABS: 20.0000 mg | ORAL_TABLET | Freq: Two times a day (BID) | ORAL | 5 refills | Status: DC

## 2016-01-21 MED ORDER — ZOSTER VACCINE LIVE 19400 UNT/0.65ML ~~LOC~~ SUSR: 0.6500 mL | Freq: Once | SUBCUTANEOUS | 0 refills | Status: AC

## 2016-01-21 NOTE — Progress Notes (Signed)
Subjective:    Patient ID: Jared White, male    DOB: [birth year], 70 y.o.   MRN: [id number]  By signing my name below, I, Judithe Modest, attest that this documentation has been prepared under the direction and in the presence of Sonia Baller, MD. Electronically Signed: Judithe Modest, ER Scribe. 01/21/2016. 10:41 AM.  01/21/2016  Follow-up (kidney fx)  HPI HPI Comments: Jared White is a 70 y.o. male who presents to [facility] reporting for a repeate blood and urine tests. His appointment with nephrology is on 09/07. He has an appointment to see a urology next week. He has seen a endocrinologist, and has an another appointment on 09/12. The endocrinologist suggested he stop taking Prilosec and instead take maximum strength pepcid. He has received both pneumonia vaccines at [facility]. He needs a shingles shot. He denies swelling in extremities or SOB. His appetite is normal. He does not have a cardiologist right now, but plans to get one. He has not taken advil, ibuprofen, aleve in years. He has not been on any recent abx. He has not had a CT scan recently. He stopped drinking soda after his hospitalization. His vasartin has not been increased in years.   He woke up with a cramp in his foot one week ago and had a muscle spasm in his right thigh the next day, and later the same day he had another muscle spasm in his anterior shin. This morning he had tingling in the left side of his face. He denies numbness. He also states when he coughs or urinates he has a tingling up the back of his head since May, 2017. He does not have this sensation with lifting. He has spoken to multiple physicians about these sx without a good explanation.   He saw Dr. Carlota Raspberry on 08/19 for hospital follow for hypocalcemia and low magnesium. At that time his creatinine was increased to 1.79 from 1.05, and urinalysis which showed 100 protein and hematuria on with moderate red blood cells under micro. Urine culture was negative at that  time. He was referred by Dr. Nyoka Cowden to urology at that visit. He underwent renal US that showed mild prostate enlargement and renal cyst. Dr. Nyoka Cowden referred to nephrology for low calcium and magnesium.    Review of Systems  Constitutional: Negative for activity change, appetite change, chills, diaphoresis, fatigue and fever.  HENT: Negative for facial swelling.   Eyes: Negative for visual disturbance.  Respiratory: Negative for cough and shortness of breath.   Cardiovascular: Negative for chest pain, palpitations and leg swelling.  Endocrine: Negative for cold intolerance, heat intolerance, polydipsia, polyphagia and polyuria.  Neurological: Negative for dizziness, tremors, seizures, syncope, facial asymmetry, speech difficulty, weakness, light-headedness, numbness and headaches.       Tingling on the left side of his face    Past Medical History:  Diagnosis Date  . Broken back   . CAD (coronary artery disease)    PCI & CABG  . CHF (congestive heart failure) ([place])   . Diverticulitis    mild - approx 2004  . Family history of heart disease   . GERD (gastroesophageal reflux disease)   . History of nuclear stress test 06/30/2011   lexiscan; normal pattern of perfusion post-stress; low risk scan   . Hyperlipidemia   . Hypertension   . Irregular heart beat   . Systemic hypertension    Past Surgical History:  Procedure Laterality Date  . CARDIAC CATHETERIZATION  08/1995  PTCA of OM (Dr. Marella Chimes)  . CORONARY ARTERY BYPASS GRAFT  01/2000   x5 - LIMA to LAD, SVG to diagonal, sequential SVG to ramus & OM, SVG to PDA (Dr. Tharon Aquas Trigt)  . LUNG LOBECTOMY     left upper lobe  . TRANSTHORACIC ECHOCARDIOGRAM  12/24/2012   EF 55-60%, mild LVH, mild conc hypertrophy; mild MR; LA mildly dilated   No Known Allergies  Social History   Social History  . Marital status: Single    Spouse name: n/a  . Number of children: 0  . Years of education: N/A   Occupational History  .  Chief Strategy Officer, [employer]    Social History Main Topics  . Smoking status: Never Smoker  . Smokeless tobacco: Never Used  . Alcohol use Yes     Comment: rarely  . Drug use: No  . Sexual activity: Not on file   Other Topics Concern  . Not on file   Social History Narrative   Lives alone.   Sister-in-Law lives next door.   Family History  Problem Relation Age of Onset  . Heart attack Mother   . Cancer Father   . Kidney failure Brother        Objective:    BP 130/72 (BP Location: Right Arm, Patient Position: Sitting, Cuff Size: Normal)   Pulse 63   Temp 98.4 F (36.9 C)   Resp 18   Ht 5' 7.72" (1.72 m)   Wt 180 lb 12.8 oz (82 kg)   SpO2 97%   BMI 27.72 kg/m  Physical Exam  Constitutional: He is oriented to person, place, and time. He appears well-developed and well-nourished. No distress.  HENT:  Head: Normocephalic and atraumatic.  Right Ear: External ear normal.  Left Ear: External ear normal.  Nose: Nose normal.  Mouth/Throat: Oropharynx is clear and moist.  Eyes: Conjunctivae and EOM are normal. Pupils are equal, round, and reactive to light.  Neck: Normal range of motion. Neck supple. Carotid bruit is not present. No thyromegaly present.  Cardiovascular: Normal rate, regular rhythm, normal heart sounds and intact distal pulses.  Exam reveals no gallop and no friction rub.   No murmur heard. Pulmonary/Chest: Effort normal and breath sounds normal. No respiratory distress. He has no wheezes. He has no rales.  Abdominal: Soft. Bowel sounds are normal. He exhibits no distension and no mass. There is no tenderness. There is no rebound and no guarding.  Musculoskeletal: Normal range of motion.  Lymphadenopathy:    He has no cervical adenopathy.  Neurological: He is alert and oriented to person, place, and time. No cranial nerve deficit. Coordination normal.  Skin: Skin is warm and dry. No rash noted. He is not diaphoretic.  Psychiatric: He has a normal mood  and affect. His behavior is normal.  Nursing note and vitals reviewed.  Results for orders placed or performed in visit on 01/21/16  Comprehensive metabolic panel  Result Value Ref Range   Sodium 140 135 - 146 mmol/L   Potassium 4.6 3.5 - 5.3 mmol/L   Chloride 106 98 - 110 mmol/L   CO2 25 20 - 31 mmol/L   Glucose, Bld 197 (H) 65 - 99 mg/dL   BUN 28 (H) 7 - 25 mg/dL   Creat 1.61 (H) 0.70 - 1.25 mg/dL   Total Bilirubin 0.3 0.2 - 1.2 mg/dL   Alkaline Phosphatase 55 40 - 115 U/L   AST 14 10 - 35 U/L   ALT 19 9 -  46 U/L   Total Protein 6.8 6.1 - 8.1 g/dL   Albumin 4.0 3.6 - 5.1 g/dL   Calcium 9.3 8.6 - 10.3 mg/dL  POCT urinalysis dipstick  Result Value Ref Range   Color, UA yellow yellow   Clarity, UA clear clear   Glucose, UA negative negative   Bilirubin, UA negative negative   Ketones, POC UA negative negative   Spec Grav, UA 1.020    Blood, UA large (A) negative   pH, UA 5.0    Protein Ur, POC =100 (A) negative   Urobilinogen, UA 0.2    Nitrite, UA Negative Negative   Leukocytes, UA Negative Negative  POCT Microscopic Urinalysis ([facility])  Result Value Ref Range   WBC,UR,HPF,POC Few (A) None WBC/hpf   RBC,UR,HPF,POC Few (A) None RBC/hpf   Bacteria None None, Too numerous to count   Mucus Absent Absent   Epithelial Cells, UR Per Microscopy Moderate (A) None, Too numerous to count cells/hpf       Assessment & Plan:   1. Renal insufficiency   2. Hypocalcemia   3. Coronary artery disease due to calcified coronary lesion   4. Gastroesophageal reflux disease without esophagitis   5. S/P CABG x 4   6. Need for Zostavax administration     Orders Placed This Encounter  Procedures  . Comprehensive metabolic panel  . POCT urinalysis dipstick  . POCT Microscopic Urinalysis ([facility])   Meds ordered this encounter  Medications  . famotidine (PEPCID) 20 MG tablet    Sig: Take 1 tablet (20 mg total) by mouth 2 (two) times daily.    Dispense:  60 tablet    Refill:  5  .  Zoster Vaccine Live, PF, (ZOSTAVAX) 32440 UNT/0.65ML injection    Sig: Inject 19,400 Units into the skin once.    Dispense:  0.65 mL    Refill:  0    No Follow-up on file.    I personally performed the services described in this documentation, which was scribed in my presence. The recorded information has been reviewed and considered.  Alysen Smylie Elayne Guerin, M.D. [facility] [address] [place], [place]  [address] [phone] phone [phone] fax

## 2016-01-21 NOTE — Patient Instructions (Signed)
     IF you received an x-ray today, you will receive an invoice from [facility]. Please contact [facility] at [phone] with questions or concerns regarding your invoice.   IF you received labwork today, you will receive an invoice from [facility]. Please contact [facility] at [phone] with questions or concerns regarding your invoice.   Our billing staff will not be able to assist you with questions regarding bills from these companies.  You will be contacted with the lab results as soon as they are available. The fastest way to get your results is to activate your My Chart account. Instructions are located on the last page of this paperwork. If you have not heard from us regarding the results in 2 weeks, please contact this office.      

## 2016-01-28 DIAGNOSIS — R3129 Other microscopic hematuria: Secondary | ICD-10-CM | POA: Diagnosis not present

## 2016-01-28 DIAGNOSIS — R319 Hematuria, unspecified: Secondary | ICD-10-CM | POA: Diagnosis not present

## 2016-01-28 DIAGNOSIS — E538 Deficiency of other specified B group vitamins: Secondary | ICD-10-CM | POA: Diagnosis not present

## 2016-01-28 DIAGNOSIS — N183 Chronic kidney disease, stage 3 (moderate): Secondary | ICD-10-CM | POA: Diagnosis not present

## 2016-01-28 DIAGNOSIS — E876 Hypokalemia: Secondary | ICD-10-CM | POA: Diagnosis not present

## 2016-01-28 DIAGNOSIS — D649 Anemia, unspecified: Secondary | ICD-10-CM | POA: Diagnosis not present

## 2016-01-28 DIAGNOSIS — D631 Anemia in chronic kidney disease: Secondary | ICD-10-CM | POA: Diagnosis not present

## 2016-01-28 DIAGNOSIS — N289 Disorder of kidney and ureter, unspecified: Secondary | ICD-10-CM | POA: Diagnosis not present

## 2016-03-02 DIAGNOSIS — R3121 Asymptomatic microscopic hematuria: Secondary | ICD-10-CM | POA: Diagnosis not present

## 2016-03-09 DIAGNOSIS — N200 Calculus of kidney: Secondary | ICD-10-CM | POA: Diagnosis not present

## 2016-03-09 DIAGNOSIS — R3121 Asymptomatic microscopic hematuria: Secondary | ICD-10-CM | POA: Diagnosis not present

## 2016-03-30 DIAGNOSIS — E538 Deficiency of other specified B group vitamins: Secondary | ICD-10-CM | POA: Diagnosis not present

## 2016-03-30 DIAGNOSIS — N289 Disorder of kidney and ureter, unspecified: Secondary | ICD-10-CM | POA: Diagnosis not present

## 2016-03-30 DIAGNOSIS — N183 Chronic kidney disease, stage 3 (moderate): Secondary | ICD-10-CM | POA: Diagnosis not present

## 2016-03-30 DIAGNOSIS — E876 Hypokalemia: Secondary | ICD-10-CM | POA: Diagnosis not present

## 2016-03-30 DIAGNOSIS — R7309 Other abnormal glucose: Secondary | ICD-10-CM | POA: Diagnosis not present

## 2016-03-30 DIAGNOSIS — N200 Calculus of kidney: Secondary | ICD-10-CM | POA: Diagnosis not present

## 2016-03-31 ENCOUNTER — Other Ambulatory Visit (HOSPITAL_COMMUNITY): Payer: Self-pay | Admitting: Nephrology

## 2016-03-31 DIAGNOSIS — R319 Hematuria, unspecified: Secondary | ICD-10-CM

## 2016-04-08 ENCOUNTER — Other Ambulatory Visit: Payer: Self-pay | Admitting: Radiology

## 2016-04-11 ENCOUNTER — Ambulatory Visit (HOSPITAL_COMMUNITY)
Admission: RE | Admit: 2016-04-11 | Discharge: 2016-04-11 | Disposition: A | Discharge status: Home / Self Care | Payer: Medicare Other | Source: Ambulatory Visit | Attending: Nephrology | Admitting: Nephrology

## 2016-04-11 ENCOUNTER — Encounter (HOSPITAL_COMMUNITY): Payer: Self-pay

## 2016-04-11 DIAGNOSIS — I509 Heart failure, unspecified: Secondary | ICD-10-CM | POA: Insufficient documentation

## 2016-04-11 DIAGNOSIS — Z841 Family history of disorders of kidney and ureter: Secondary | ICD-10-CM | POA: Insufficient documentation

## 2016-04-11 DIAGNOSIS — Z8249 Family history of ischemic heart disease and other diseases of the circulatory system: Secondary | ICD-10-CM | POA: Insufficient documentation

## 2016-04-11 DIAGNOSIS — Z951 Presence of aortocoronary bypass graft: Secondary | ICD-10-CM | POA: Diagnosis not present

## 2016-04-11 DIAGNOSIS — E785 Hyperlipidemia, unspecified: Secondary | ICD-10-CM | POA: Diagnosis not present

## 2016-04-11 DIAGNOSIS — Z7982 Long term (current) use of aspirin: Secondary | ICD-10-CM | POA: Insufficient documentation

## 2016-04-11 DIAGNOSIS — Z9861 Coronary angioplasty status: Secondary | ICD-10-CM | POA: Diagnosis not present

## 2016-04-11 DIAGNOSIS — Z7951 Long term (current) use of inhaled steroids: Secondary | ICD-10-CM | POA: Diagnosis not present

## 2016-04-11 DIAGNOSIS — R809 Proteinuria, unspecified: Secondary | ICD-10-CM | POA: Diagnosis not present

## 2016-04-11 DIAGNOSIS — I130 Hypertensive heart and chronic kidney disease with heart failure and stage 1 through stage 4 chronic kidney disease, or unspecified chronic kidney disease: Secondary | ICD-10-CM | POA: Diagnosis not present

## 2016-04-11 DIAGNOSIS — I2510 Atherosclerotic heart disease of native coronary artery without angina pectoris: Secondary | ICD-10-CM | POA: Diagnosis not present

## 2016-04-11 DIAGNOSIS — N028 Recurrent and persistent hematuria with other morphologic changes: Secondary | ICD-10-CM | POA: Insufficient documentation

## 2016-04-11 DIAGNOSIS — N189 Chronic kidney disease, unspecified: Secondary | ICD-10-CM | POA: Diagnosis not present

## 2016-04-11 DIAGNOSIS — Z79899 Other long term (current) drug therapy: Secondary | ICD-10-CM | POA: Insufficient documentation

## 2016-04-11 DIAGNOSIS — K219 Gastro-esophageal reflux disease without esophagitis: Secondary | ICD-10-CM | POA: Diagnosis not present

## 2016-04-11 DIAGNOSIS — R319 Hematuria, unspecified: Secondary | ICD-10-CM

## 2016-04-11 LAB — PROTIME-INR
INR: 1.02
Prothrombin Time: 13.4 seconds (ref 11.4–15.2)

## 2016-04-11 LAB — CBC
HCT: 39.6 % (ref 39.0–52.0)
Hemoglobin: 13 g/dL (ref 13.0–17.0)
MCH: 30.3 pg (ref 26.0–34.0)
MCHC: 32.8 g/dL (ref 30.0–36.0)
MCV: 92.3 fL (ref 78.0–100.0)
Platelets: 157 10*3/uL (ref 150–400)
RBC: 4.29 MIL/uL (ref 4.22–5.81)
RDW: 13.5 % (ref 11.5–15.5)
WBC: 8.9 10*3/uL (ref 4.0–10.5)

## 2016-04-11 LAB — APTT: aPTT: 28 seconds (ref 24–36)

## 2016-04-11 MED ORDER — LIDOCAINE HCL 1 % IJ SOLN
INTRAMUSCULAR | Status: AC
  Filled 2016-04-11: qty 20

## 2016-04-11 MED ORDER — FENTANYL CITRATE (PF) 100 MCG/2ML IJ SOLN
INTRAMUSCULAR | Status: AC | PRN
  Administered 2016-04-11: 50 ug via INTRAVENOUS
  Administered 2016-04-11: 25 ug via INTRAVENOUS

## 2016-04-11 MED ORDER — SODIUM CHLORIDE 0.9 % IV SOLN
INTRAVENOUS | Status: AC | PRN
  Administered 2016-04-11: 10 mL/h via INTRAVENOUS

## 2016-04-11 MED ORDER — MIDAZOLAM HCL 2 MG/2ML IJ SOLN
INTRAMUSCULAR | Status: AC
  Filled 2016-04-11: qty 2

## 2016-04-11 MED ORDER — FENTANYL CITRATE (PF) 100 MCG/2ML IJ SOLN
INTRAMUSCULAR | Status: AC
  Filled 2016-04-11: qty 2

## 2016-04-11 MED ORDER — HYDROCODONE-ACETAMINOPHEN 5-325 MG PO TABS: 1.0000 | ORAL_TABLET | ORAL | Status: DC | PRN

## 2016-04-11 MED ORDER — SODIUM CHLORIDE 0.9 % IV SOLN: INTRAVENOUS | Status: DC

## 2016-04-11 MED ORDER — MIDAZOLAM HCL 2 MG/2ML IJ SOLN
INTRAMUSCULAR | Status: AC | PRN
  Administered 2016-04-11: 0.5 mg via INTRAVENOUS
  Administered 2016-04-11: 1 mg via INTRAVENOUS

## 2016-04-11 NOTE — H&P (Signed)
Chief Complaint: Patient was seen in consultation today for random renal biopsy at the request of [place]  Referring Physician(s): [place]  Supervising Physician: Aletta Edouard  Patient Status: [facility] - Out-pt  History of Present Illness: Jared White is a 70 y.o. male   Hematuria; proteinuria Rising creatinine Referred to Dr Madelon Lips for evaluation Now scheduled for random renal biopsy   Past Medical History:  Diagnosis Date  . Broken back   . CAD (coronary artery disease)    PCI & CABG  . CHF (congestive heart failure) ([place])   . Diverticulitis    mild - approx 2004  . Family history of heart disease   . GERD (gastroesophageal reflux disease)   . History of nuclear stress test 06/30/2011   lexiscan; normal pattern of perfusion post-stress; low risk scan   . Hyperlipidemia   . Hypertension   . Irregular heart beat   . Systemic hypertension     Past Surgical History:  Procedure Laterality Date  . CARDIAC CATHETERIZATION  08/1995   PTCA of OM (Dr. Marella Chimes)  . CORONARY ARTERY BYPASS GRAFT  01/2000   x5 - LIMA to LAD, SVG to diagonal, sequential SVG to ramus & OM, SVG to PDA (Dr. Tharon Aquas Trigt)  . LUNG LOBECTOMY     left upper lobe  . TRANSTHORACIC ECHOCARDIOGRAM  12/24/2012   EF 55-60%, mild LVH, mild conc hypertrophy; mild MR; LA mildly dilated    Allergies: Patient has no known allergies.  Medications: Prior to Admission medications   Medication Sig Start Date End Date Taking? Authorizing Provider  amLODipine (NORVASC) 5 MG tablet TAKE ONE TABLET BY MOUTH ONE TIME DAILY 01/12/15  Yes Pixie Casino, MD  aspirin 325 MG tablet Take 325 mg by mouth daily.   Yes Historical Provider, MD  atenolol (TENORMIN) 50 MG tablet TAKE ONE TABLET BY MOUTH ONE TIME DAILY 04/10/15  Yes Pixie Casino, MD  calcium carbonate (TUMS - DOSED IN MG ELEMENTAL CALCIUM) 500 MG chewable tablet Chew 1 tablet (200 mg of elemental calcium total) by mouth 3  (three) times daily with meals. 10/09/15  Yes Domenic Polite, MD  clobetasol cream (TEMOVATE) [id number] % Apply 1 application topically 2 (two) times daily as needed. Patient taking differently: Apply 1 application topically 2 (two) times daily as needed (for rash).  11/21/13  Yes Pixie Casino, MD  ergocalciferol (VITAMIN D2) 50000 units capsule Take 1 capsule (50,000 Units total) by mouth once a week. Patient taking differently: Take 50,000 Units by mouth once a week. Friday. 10/16/15  Yes Chelle Jeffery, PA-C  famotidine (PEPCID) 20 MG tablet Take 1 tablet (20 mg total) by mouth 2 (two) times daily. 01/21/16  Yes Wardell Honour, MD  fluticasone [facility]) 50 MCG/ACT nasal spray USE TWO SPRAYS IN [facility] NOSTRIL DAILY  04/24/14  Yes Pixie Casino, MD  Magnesium Oxide 400 MG CAPS Take 1 capsule (400 mg total) by mouth 2 (two) times daily. 10/09/15  Yes Domenic Polite, MD  MAGNESIUM-OXIDE 400 (241.3 Mg) MG tablet Take 1 tablet by mouth 2 (two) times daily. 10/09/15  Yes Historical Provider, MD  potassium chloride SA (K-DUR,KLOR-CON) 20 MEQ tablet Take 2 tablets (40 mEq total) by mouth daily. 10/09/15  Yes Domenic Polite, MD  simvastatin (ZOCOR) 80 MG tablet TAKE 1 TABLET BY MOUTH AT BEDTIME. 04/10/15  Yes Pixie Casino, MD  valsartan (DIOVAN) 160 MG tablet TAKE 1 TABLET BY MOUTH ONE TIME DAILY. 11/17/14  Yes  Pixie Casino, MD     Family History  Problem Relation Age of Onset  . Heart attack Mother   . Cancer Father   . Kidney failure Brother     Social History   Social History  . Marital status: Single    Spouse name: n/a  . Number of children: 0  . Years of education: N/A   Occupational History  . Chief Strategy Officer, [employer]    Social History Main Topics  . Smoking status: Never Smoker  . Smokeless tobacco: Never Used  . Alcohol use Yes     Comment: rarely  . Drug use: No  . Sexual activity: Not Asked   Other Topics Concern  . None   Social History Narrative   Lives alone.    Sister-in-Law lives next door.    Review of Systems: A 12 point ROS discussed and pertinent positives are indicated in the HPI above.  All other systems are negative.  Review of Systems  Constitutional: Negative for activity change, appetite change, fatigue and fever.  Respiratory: Positive for shortness of breath. Negative for cough.   Gastrointestinal: Negative for abdominal pain.  Psychiatric/Behavioral: Negative for behavioral problems and confusion.    Vital Signs: BP 136/80   Pulse (!) 57   Temp 98.2 F (36.8 C) (Oral)   Resp 18   Ht 5\' 9"  (1.753 m)   Wt 180 lb (81.6 kg)   SpO2 100%   BMI 26.58 kg/m   Physical Exam  Constitutional: He is oriented to person, place, and time.  Cardiovascular: Normal rate, regular rhythm and normal heart sounds.   Pulmonary/Chest: Effort normal and breath sounds normal.  Abdominal: Soft. Bowel sounds are normal.  Musculoskeletal: Normal range of motion.  Neurological: He is alert and oriented to person, place, and time.  Skin: Skin is warm and dry.  Psychiatric: He has a normal mood and affect. His behavior is normal. Judgment and thought content normal.  Nursing note and vitals reviewed.   Mallampati Score:  MD Evaluation Airway: WNL Heart: WNL Abdomen: WNL Chest/ Lungs: WNL ASA  Classification: 2 Mallampati/Airway Score: Two  Imaging: No results found.  Labs:  CBC:  Recent Labs  10/08/15 1045 10/09/15 0458 11/06/15 1130 04/11/16 0615  WBC 8.5 9.6 9.2 8.9  HGB 12.5* 12.3* 13.3 13.0  HCT 36.0* 35.5* 39.5 39.6  PLT 159 151 189 157    COAGS:  Recent Labs  04/11/16 0615  INR 1.02  APTT 28    BMP:  Recent Labs  10/08/15 1045  10/09/15 0458 10/16/15 1205 11/06/15 1130 01/09/16 1105 01/21/16 1030  NA 141  --  141 141 138 138 140  K 3.2*  < > 3.2* 4.9 4.4 4.5 4.6  CL 108  --  109 103 107 105 106  CO2 26  --  25 27 26 23 25   GLUCOSE 131*  --  136* 106* 128* 126* 197*  BUN 18  --  17 20 27* 38* 28*    CALCIUM 6.4*  --  7.0*  7.0* 9.5 9.2 9.4 9.3  CREATININE 1.04  --  0.87 1.05 1.09 1.79* 1.61*  GFRNONAA >60  --  >60  --  >60 38*  --   GFRAA >60  --  >60  --  >60 44*  --   < > = values in this interval not displayed.  LIVER FUNCTION TESTS:  Recent Labs  10/16/15 1205 11/06/15 1130 01/09/16 1105 01/21/16 1030  BILITOT 0.4 0.5 0.5 0.3  AST 16 17 15 14   ALT 15 16* 16 19  ALKPHOS 80 67 61 55  PROT 6.9 6.9 7.0 6.8  ALBUMIN 4.0 3.6 4.1 4.0    TUMOR MARKERS: No results for input(s): AFPTM, CEA, CA199, CHROMGRNA in the last 8760 hours.  Assessment and Plan:  Hematuria; proteinuria Increasing creatinine Now scheduled for random renal biopsy Risks and Benefits discussed with the patient including, but not limited to bleeding, infection, damage to adjacent structures or low yield requiring additional tests. All of the patient's questions were answered, patient is agreeable to proceed. Consent signed and in chart.   Thank you for this interesting consult.  I greatly enjoyed meeting Jared White and look forward to participating in their care.  A copy of this report was sent to the requesting provider on this date.  Electronically Signed: Monia Sabal A 04/11/2016, 7:24 AM   I spent a total of  30 Minutes   in face to face in clinical consultation, greater than 50% of which was counseling/coordinating care for random renal biopsy

## 2016-04-11 NOTE — Procedures (Signed)
Interventional Radiology Procedure Note  Procedure:  US guided renal biopsy  Complications:  None  Estimated Blood Loss: < 10 mL  16 G core biopsy x 2 of LP of right kidney.  No complications.  Venetia Night. Kathlene Cote, M.D Pager:  [phone]

## 2016-04-11 NOTE — Discharge Instructions (Signed)
Percutaneous Kidney Biopsy, Care After °This sheet gives you information about how to care for yourself after your procedure. Your health care provider may also give you more specific instructions. If you have problems or questions, contact your health care provider. °What can I expect after the procedure? °After the procedure, it is common to have: °· Pain or soreness near the area where the needle went through your skin (biopsy site). °· Bright pink or cloudy urine for 24 hours after the procedure. °Follow these instructions at home: °Activity  °· Return to your normal activities as told by your health care provider. Ask your health care provider what activities are safe for you. °· Do not drive for 24 hours if you were given a medicine to help you relax (sedative). °· Do not lift anything that is heavier than 10 lb (4.5 kg) until your health care provider tells you that it is safe. °· Avoid activities that take a lot of effort (are strenuous) until your health care provider approves. Most people will have to wait 2 weeks before returning to activities such as exercise or sexual intercourse. °General instructions  °· Take over-the-counter and prescription medicines only as told by your health care provider. °· You may eat and drink after your procedure. Follow instructions from your health care provider about eating or drinking restrictions. °· Check your biopsy site every day for signs of infection. Check for: °¨ More redness, swelling, or pain. °¨ More fluid or blood. °¨ Warmth. °¨ Pus or a bad smell. °· Keep all follow-up visits as told by your health care provider. This is important. °Contact a health care provider if: °· You have more redness, swelling, or pain around your biopsy site. °· You have more fluid or blood coming from your biopsy site. °· Your biopsy site feels warm to the touch. °· You have pus or a bad smell coming from your biopsy site. °· You have blood in your urine more than 24 hours after  your procedure. °Get help right away if: °· You have dark red or brown urine. °· You have a fever. °· You are unable to urinate. °· You feel burning when you urinate. °· You feel faint. °· You have severe pain in your abdomen or side. °This information is not intended to replace advice given to you by your health care provider. Make sure you discuss any questions you have with your health care provider. °Document Released: 01/09/2013 Document Revised: 02/19/2016 Document Reviewed: 02/19/2016 °Elsevier Interactive Patient Education © 2017 [employer] ° °

## 2016-04-26 ENCOUNTER — Encounter (HOSPITAL_COMMUNITY): Payer: Self-pay

## 2016-05-13 IMAGING — US US BIOPSY
1 series · 13 of 16 positions shown · non-contrast
Comparison: none

CLINICAL DATA: Proteinuria and worsening renal function. Referred
for renal biopsy.

[Series 1: us biopsy · 0.26mm/px · 13 of 16 slices shown]
[im 1/16]
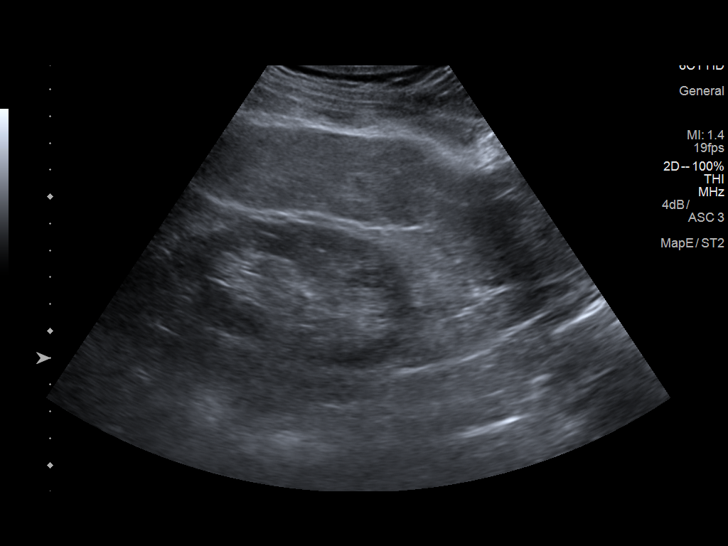
[im 2/16]
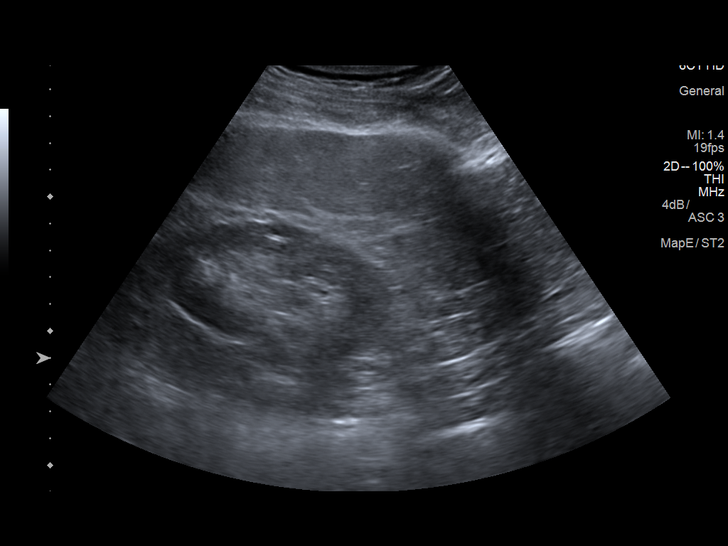
[im 4/16]
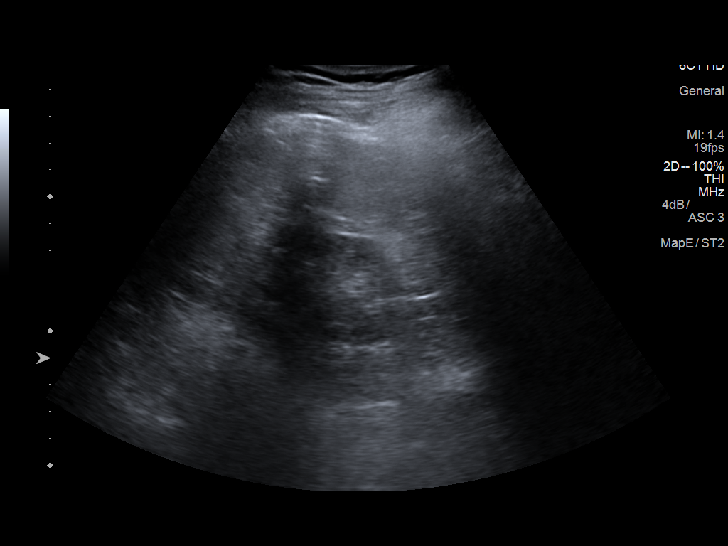
[im 5/16]
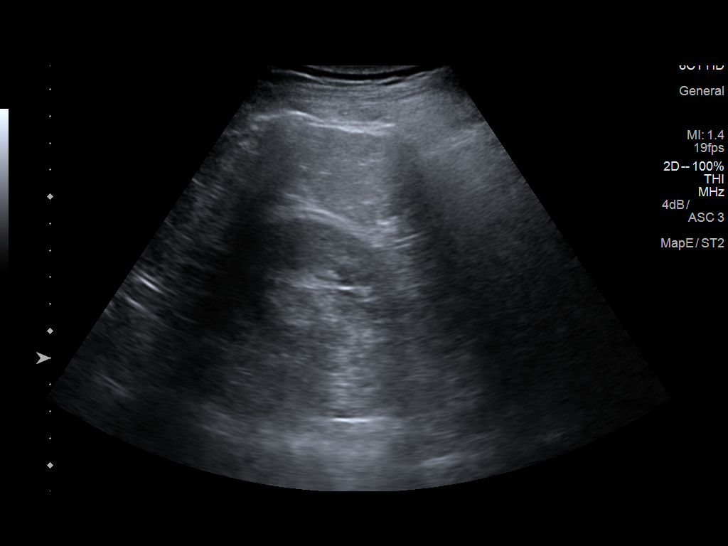
[im 6/16]
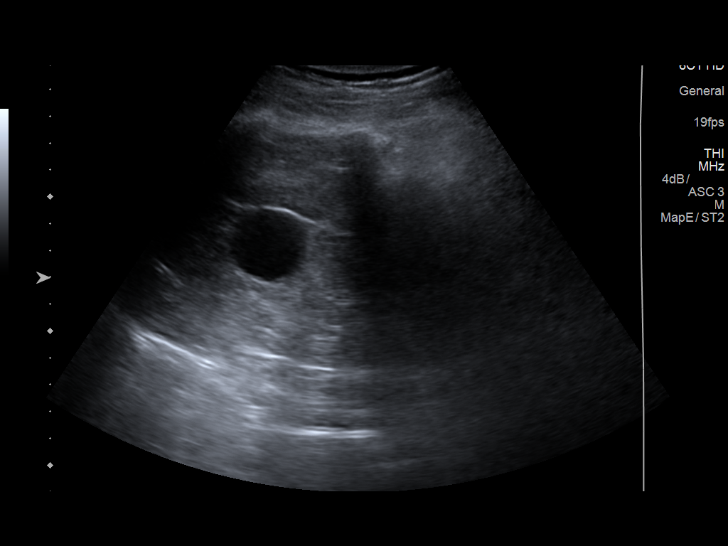
[im 7/16]
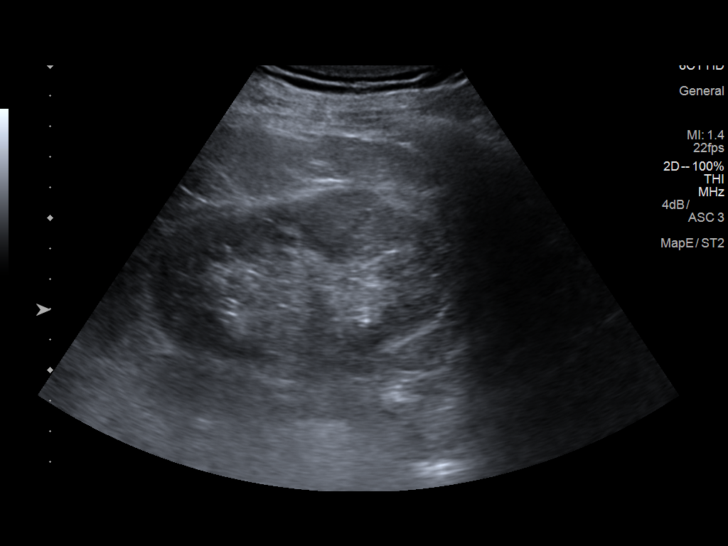
[im 9/16]
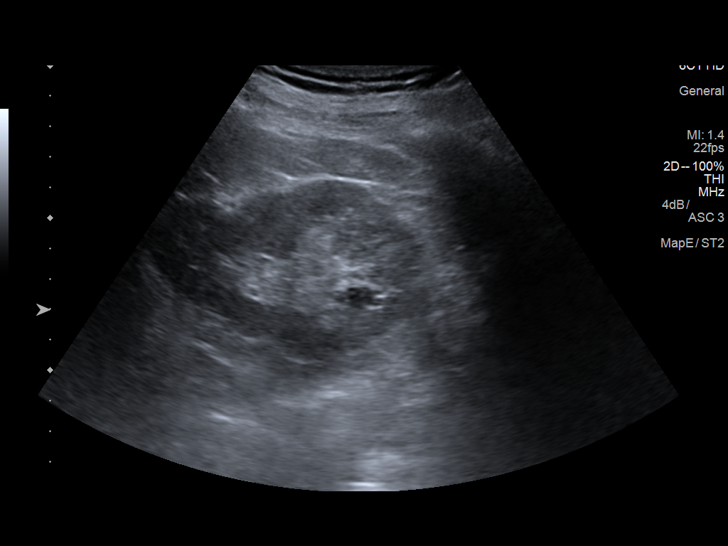
[im 10/16]
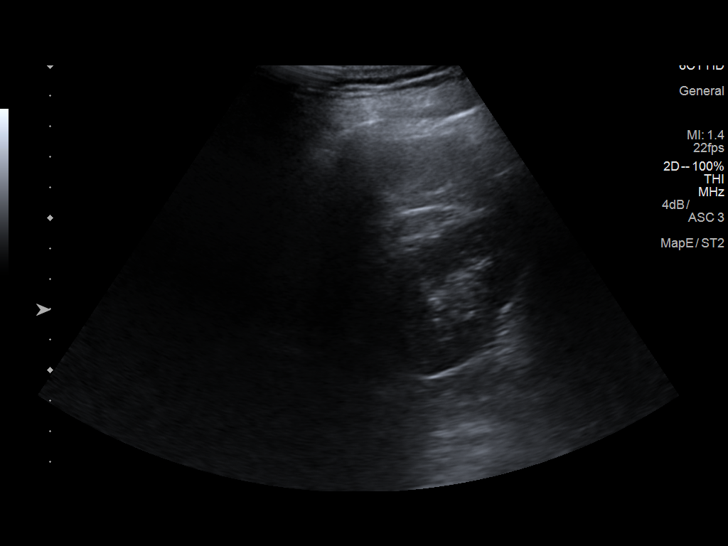
[im 11/16]
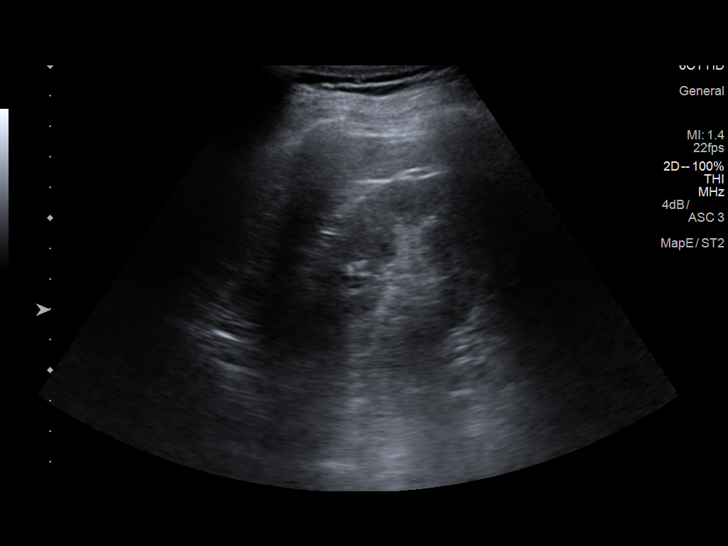
[im 12/16]
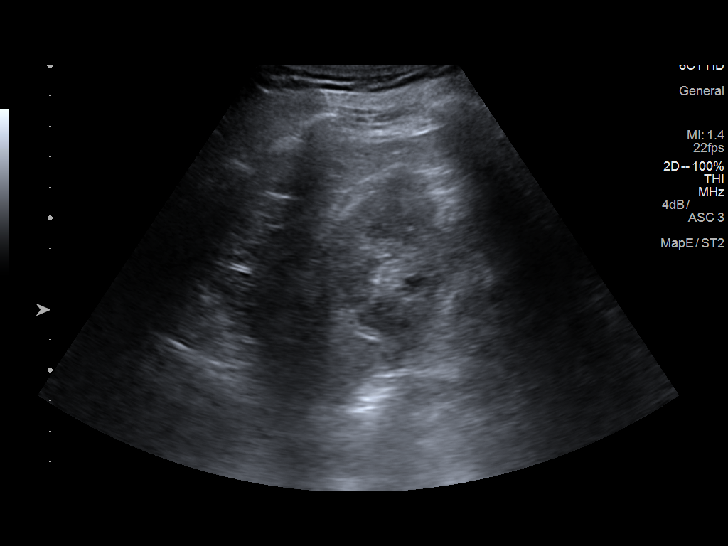
[im 13/16]
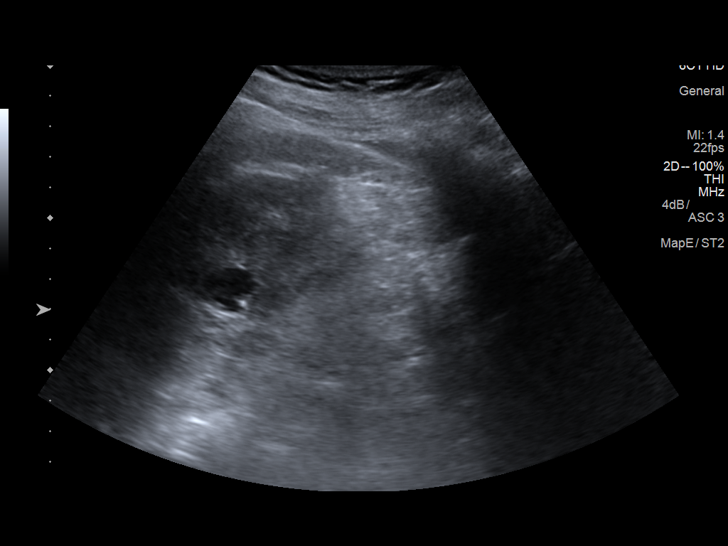
[im 15/16]
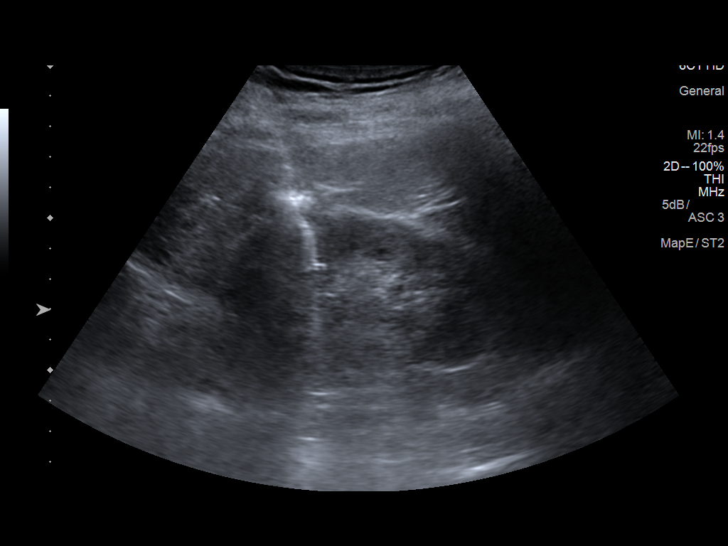
[im 16/16]
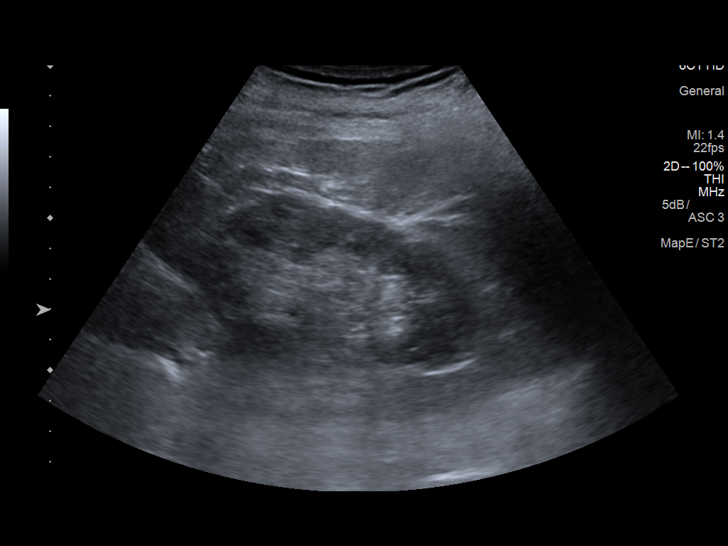

[13 of 16 positions shown; findings below may reference images not displayed]

EXAM:
ULTRASOUND GUIDED CORE BIOPSY OF RIGHT KIDNEY

MEDICATIONS:
1.5 mg IV Versed; 75 mcg IV Fentanyl

Total Moderate Sedation Time: 20 minutes.

The patient's level of consciousness and physiologic status were
continuously monitored during the procedure by Radiology nursing.

PROCEDURE:
The procedure, risks, benefits, and alternatives were explained to
the patient. Questions regarding the procedure were encouraged and
answered. The patient understands and consents to the procedure. A
time out was performed prior to initiating the procedure.

The patient was placed in a prone position. Ultrasound was performed
of both kidneys from a posterior approach. The right kidney was
chosen for biopsy. The right flank region was prepped with
chlorhexidine in a sterile fashion, and a sterile drape was applied
covering the operative field. A sterile gown and sterile gloves were
used for the procedure. Local anesthesia was provided with 1%
Lidocaine.

Under ultrasound guidance, 2 separate 16 gauge core biopsy samples
were obtained through lower pole cortex of the right kidney. Core
samples were submitted in saline. Post biopsy ultrasound was
performed.

COMPLICATIONS:
None.
FINDINGS: The left kidney demonstrates an exophytic lower pole cyst as well as
more substantial cortical thinning compared to the right kidney.
Solid tissue samples were obtained from the right kidney. There were
no immediate bleeding complications evident by ultrasound.
IMPRESSION: Ultrasound-guided core biopsy performed at the level of the level
pole cortex of the right kidney.

## 2016-06-03 DIAGNOSIS — N183 Chronic kidney disease, stage 3 (moderate): Secondary | ICD-10-CM | POA: Diagnosis not present

## 2016-06-03 DIAGNOSIS — N028 Recurrent and persistent hematuria with other morphologic changes: Secondary | ICD-10-CM | POA: Diagnosis not present

## 2016-06-03 DIAGNOSIS — I129 Hypertensive chronic kidney disease with stage 1 through stage 4 chronic kidney disease, or unspecified chronic kidney disease: Secondary | ICD-10-CM | POA: Diagnosis not present

## 2016-06-21 ENCOUNTER — Other Ambulatory Visit: Payer: Self-pay

## 2016-10-05 DIAGNOSIS — I129 Hypertensive chronic kidney disease with stage 1 through stage 4 chronic kidney disease, or unspecified chronic kidney disease: Secondary | ICD-10-CM | POA: Diagnosis not present

## 2016-10-05 DIAGNOSIS — N183 Chronic kidney disease, stage 3 (moderate): Secondary | ICD-10-CM | POA: Diagnosis not present

## 2016-11-04 ENCOUNTER — Other Ambulatory Visit: Payer: Self-pay | Admitting: Physician Assistant

## 2016-11-04 DIAGNOSIS — E559 Vitamin D deficiency, unspecified: Secondary | ICD-10-CM

## 2016-11-04 NOTE — Telephone Encounter (Signed)
Please call patient. Rx authorized. Needs to schedule follow-up and establish with one of [place].  Meds ordered this encounter  Medications  . Vitamin D, Ergocalciferol, (DRISDOL) 50000 units CAPS capsule    Sig: TAKE 1 CAPSULE (50,000 UNITS TOTAL) BY MOUTH ONCE A WEEK.    Dispense:  4 capsule    Refill:  0    Please notify patient that s/he needs an office visit +/- labsfor additional refills.

## 2016-11-08 NOTE — Telephone Encounter (Signed)
MyChart message sent to patient about making an appt

## 2016-11-10 ENCOUNTER — Ambulatory Visit (INDEPENDENT_AMBULATORY_CARE_PROVIDER_SITE_OTHER): Payer: Medicare Other | Admitting: Family Medicine

## 2016-11-10 ENCOUNTER — Ambulatory Visit (INDEPENDENT_AMBULATORY_CARE_PROVIDER_SITE_OTHER): Payer: Medicare Other

## 2016-11-10 ENCOUNTER — Encounter: Payer: Self-pay | Admitting: Family Medicine

## 2016-11-10 VITALS — BP 134/75 | HR 58 | Temp 98.1°F | Resp 18 | Ht 69.0 in | Wt 177.0 lb

## 2016-11-10 DIAGNOSIS — M549 Dorsalgia, unspecified: Secondary | ICD-10-CM | POA: Diagnosis not present

## 2016-11-10 DIAGNOSIS — R05 Cough: Secondary | ICD-10-CM

## 2016-11-10 DIAGNOSIS — R5383 Other fatigue: Secondary | ICD-10-CM | POA: Diagnosis not present

## 2016-11-10 DIAGNOSIS — Z1211 Encounter for screening for malignant neoplasm of colon: Secondary | ICD-10-CM

## 2016-11-10 DIAGNOSIS — B356 Tinea cruris: Secondary | ICD-10-CM

## 2016-11-10 DIAGNOSIS — R634 Abnormal weight loss: Secondary | ICD-10-CM | POA: Diagnosis not present

## 2016-11-10 DIAGNOSIS — R059 Cough, unspecified: Secondary | ICD-10-CM

## 2016-11-10 IMAGING — DX DG CHEST 2V
2 series · 2 of 2 positions shown · non-contrast
Comparison: [DATE]

CLINICAL DATA: Cough

EXAM:
CHEST  2 VIEW

[chest pa]
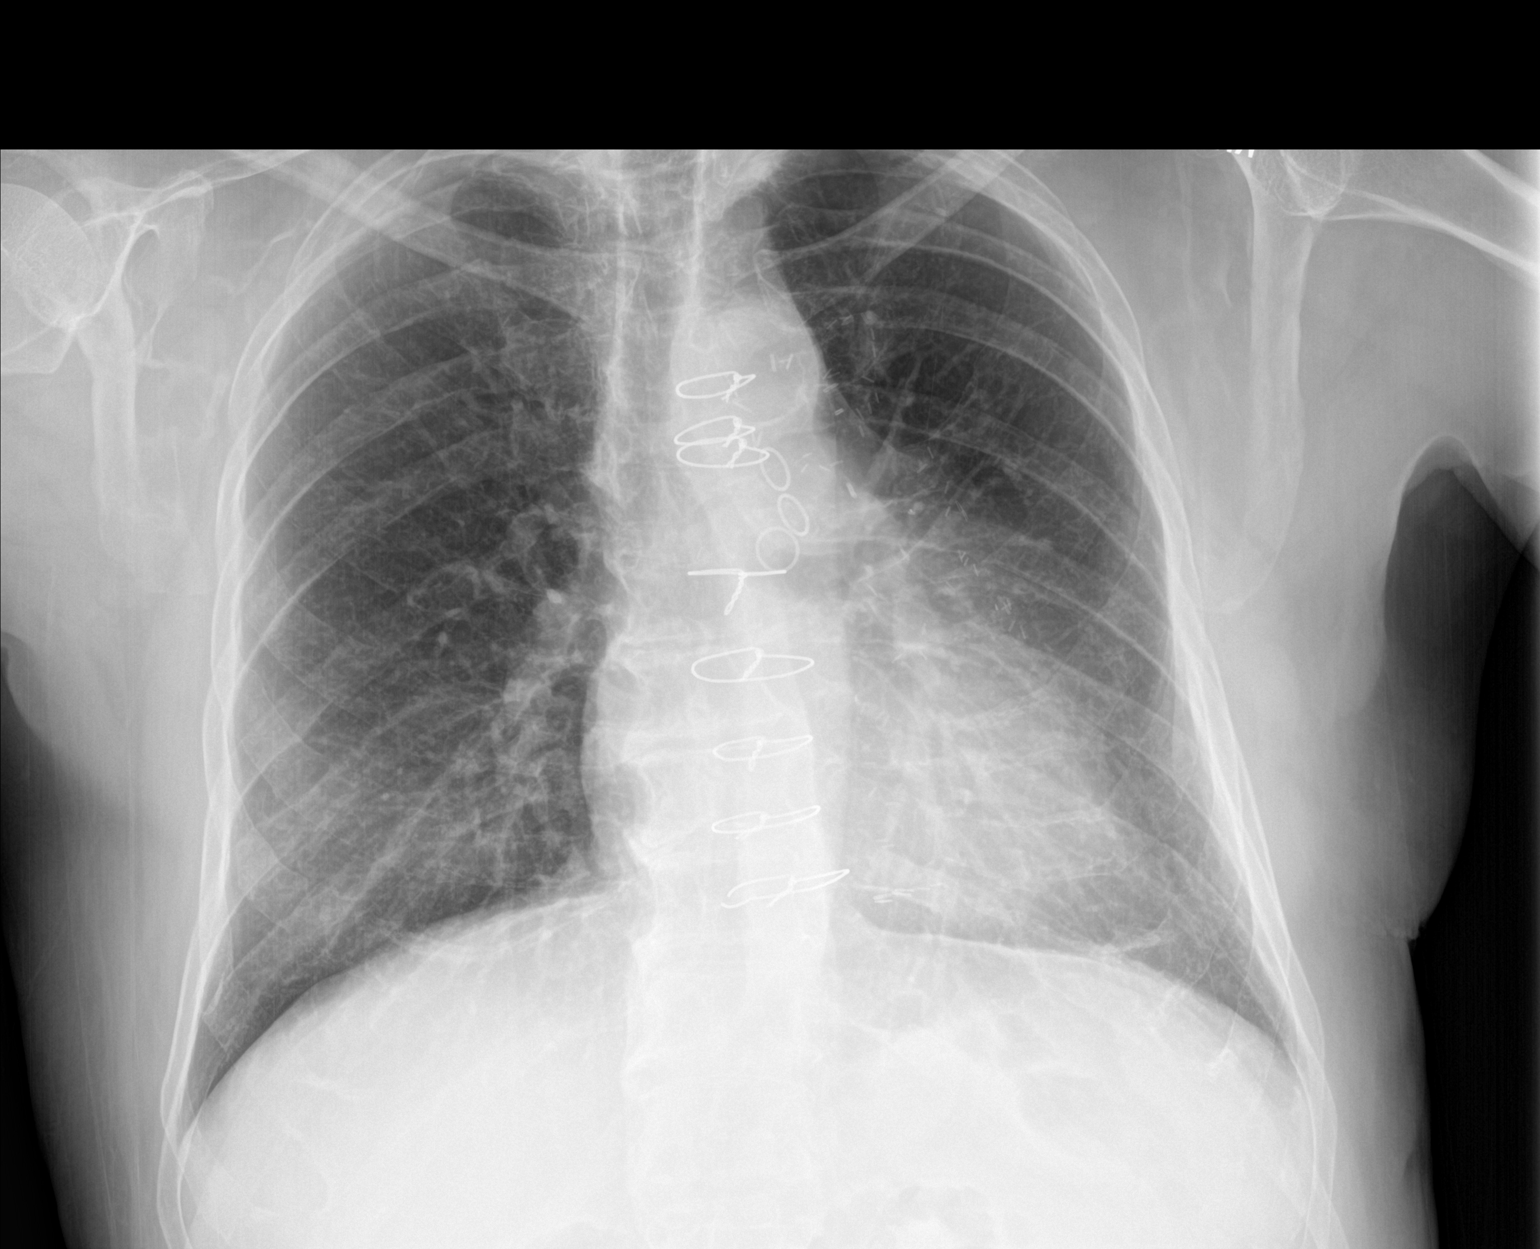

[chest lat]
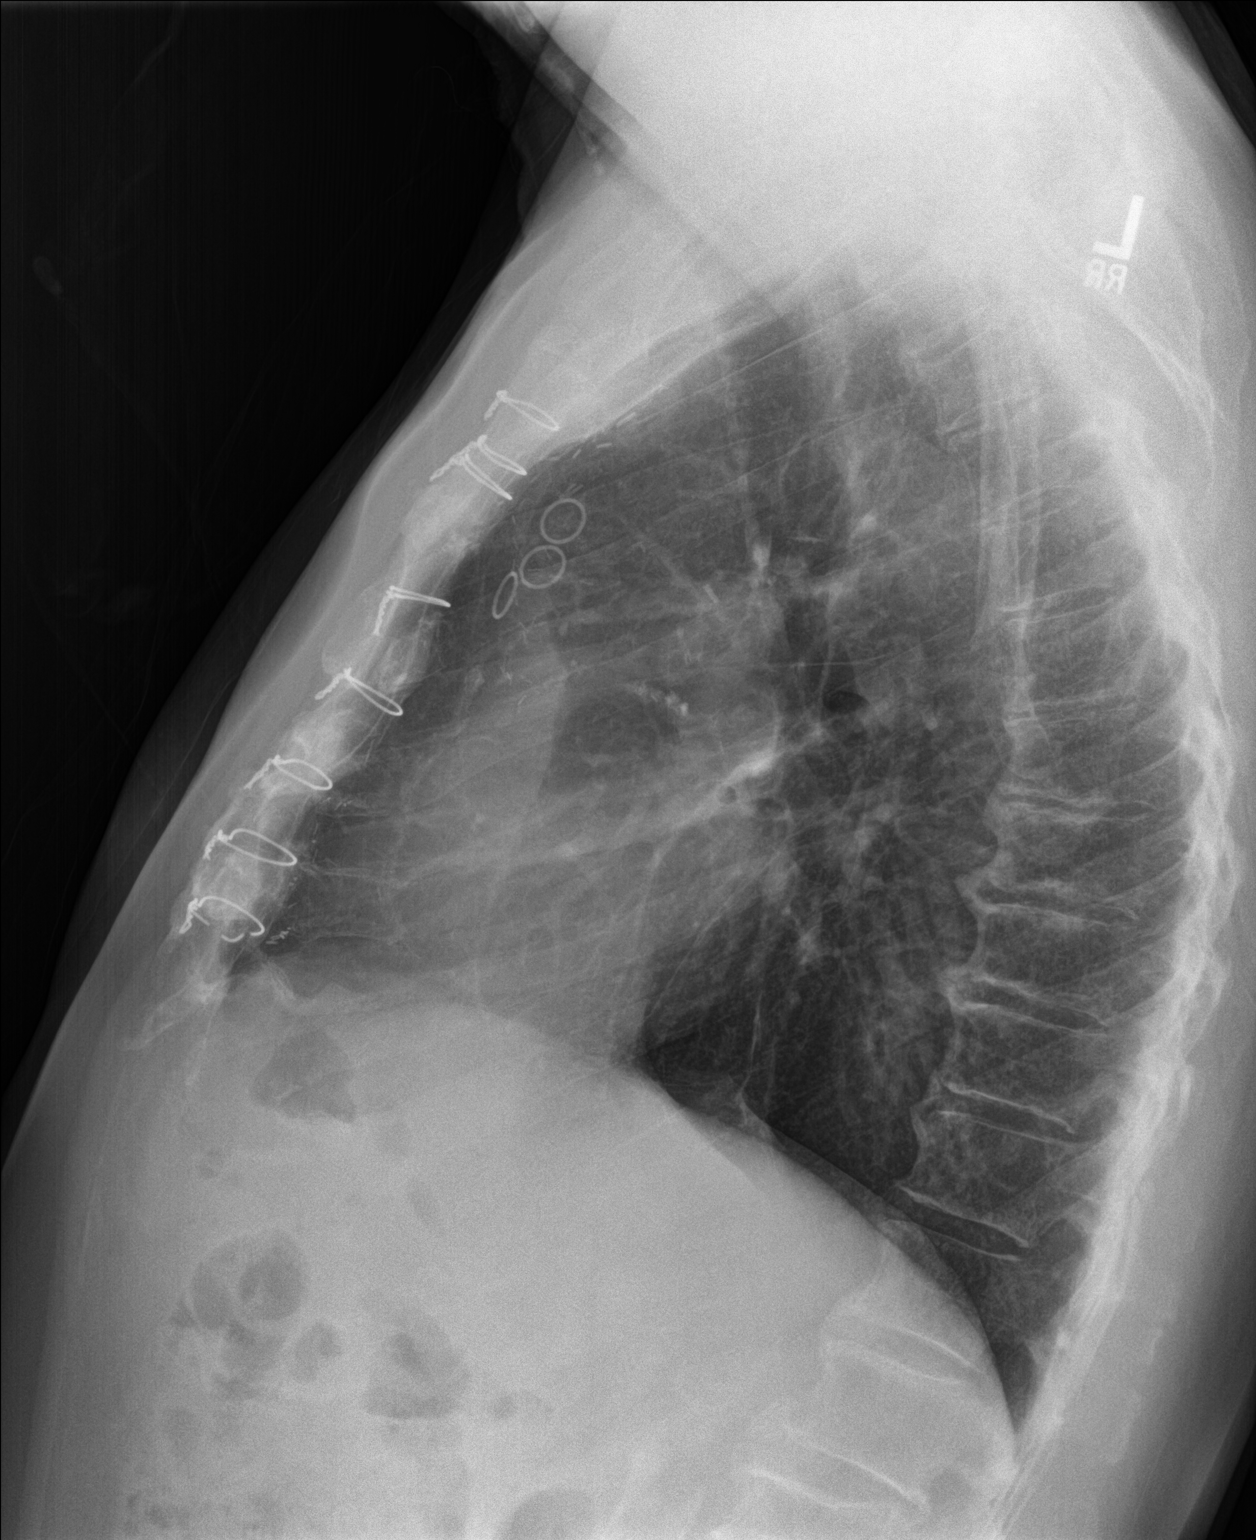

[2 of 2 positions shown; findings below may reference images not displayed]

FINDINGS: Cardiac shadow is stable. Postsurgical changes are again seen. The
lungs are well aerated bilaterally. No focal infiltrate or sizable
effusion is seen. Degenerative changes of the thoracic spine are
noted.
IMPRESSION: No acute abnormality noted.

## 2016-11-10 MED ORDER — CLOTRIMAZOLE-BETAMETHASONE 1-0.05 % EX CREA: 1.0000 "application " | TOPICAL_CREAM | Freq: Two times a day (BID) | CUTANEOUS | 0 refills | Status: DC

## 2016-11-10 NOTE — Patient Instructions (Addendum)
I would recommend checking with [facility] to make sure your last calcium was ok. Let me know if you need my assistance.   Try the new cream for rash in groin if it returns.   I will check a chest x-ray today for your cough and weight loss as well as blood work. Follow-up to discuss fatigue, weight loss, and your other symptoms further in the next few weeks. Make sure you're drinking plenty of fluids throughout the day, as well as eating regular meals to help with fatigue and weight loss.  Tylenol is safest for neck and upper back pain for now. We can discuss other possible medications next time if the Tylenol is not helping.  I will place a referral to gastroenterology for colon cancer screening.  Return to the clinic or go to the nearest emergency room if any of your symptoms worsen or new symptoms occur.   Jock Itch Jock itch (tinea cruris) is a fungal infection of the skin in the groin area. It is sometimes called ringworm, even though it is not caused by worms. It is caused by a fungus, which is a type of germ that thrives in dark, damp places. Jock itch causes a rash and itching in the groin and upper thigh area. It usually goes away in 2-3 weeks with treatment. What are the causes? The fungus that causes jock itch may be spread by:  Touching a fungus infection elsewhere on your body-such as athlete's foot-and then touching your groin area.  Sharing towels or clothing with an infected person.  What increases the risk? Jock itch is most common in men and adolescent boys. This condition is more likely to develop from:  Being in hot, humid climates.  Wearing tight-fitting clothing or wet bathing suits for long periods of time.  Participating in sports.  Being overweight.  Having diabetes.  What are the signs or symptoms? Symptoms of jock itch may include:  A red, pink, or brown rash in the groin area. The rash may spread to the thighs, anus, and  buttocks.  Dry and scaly skin on or around the rash.  Itchiness.  How is this diagnosed? Most often, a health care provider can make the diagnosis by looking at your rash. Sometimes, a scraping of the infected skin will be taken. This sample may be tested by looking at it under a microscope or by trying to grow the fungus from the sample (culture). How is this treated? Treatment for this condition may include:  Antifungal medicine to kill the fungus. This may be in various forms: ? Skin cream or ointment. ? Medicine taken by mouth.  Skin cream or ointment to reduce the itching.  Compresses or medicated powders to dry the infected skin.  Follow these instructions at home:  Take medicines only as directed by your health care provider. Apply skin creams or ointments exactly as directed.  Wear loose-fitting clothing. ? Men should wear cotton boxer shorts. ? Women should wear cotton underwear.  Change your underwear every day to keep your groin dry.  Avoid hot baths.  Dry your groin area well after bathing. ? Use a separate towel to dry your groin area. This will help to prevent a spreading of the infection to other areas of your body.  Do not scratch the affected area.  Do not share towels with other people. Contact a health care provider if:  Your rash does not improve or it gets worse after 2 weeks of treatment.  Your rash is spreading.  Your rash returns after treatment is finished.  You have a fever.  You have redness, swelling, or pain in the area around your rash.  You have fluid, blood, or pus coming from your rash.  Your have your rash for more than 4 weeks. This information is not intended to replace advice given to you by your health care provider. Make sure you discuss any questions you have with your health care provider. Document Released: 04/29/2002 Document Revised: 10/15/2015 Document Reviewed: 02/18/2014 Elsevier Interactive Patient Education  2018  [employer].    IF you received an x-ray today, you will receive an invoice from [facility]. Please contact [facility] at [phone] with questions or concerns regarding your invoice.   IF you received labwork today, you will receive an invoice from [facility]. Please contact [facility] at [phone] with questions or concerns regarding your invoice.   Our billing staff will not be able to assist you with questions regarding bills from these companies.  You will be contacted with the lab results as soon as they are available. The fastest way to get your results is to activate your My Chart account. Instructions are located on the last page of this paperwork. If you have not heard from [place] regarding the results in 2 weeks, please contact this office.

## 2016-11-10 NOTE — Progress Notes (Signed)
Subjective:  By signing my name below, I, Moises Blood, attest that this documentation has been prepared under the direction and in the presence of [name], MD. Electronically Signed: Moises Blood, [place]. 11/10/2016 , 10:03 AM .  Patient was seen in Room 25 .   Patient ID: Jared White, male    DOB: Jun 12, 1945, 71 y.o.   MRN: [id number] Chief Complaint  Patient presents with  . Follow-up    6 month follow up  . Medication Refill    temovate cream   HPI Jared White is a 71 y.o. male Here for 6 months follow up. He was last seen in August 2017. He has history of multiple medical problems.   Hypocalcemia See prior visits. He's treated by Dr. Buddy Duty. He had been treated with IV and PO calcium in May 2017. He was symptomatic with paraesthesias at that time.   Enlarged prostate He has family history of prostate cancer in his brother.   Lab Results  Component Value Date   PSA 1.9 01/09/2016   PSA 1.66 11/21/2013   He was referred to urology to evaluated hematuria.   Renal cyst/insufficiency He was referred to nephrologist at [facility], last evaluated in Jan. He was diagnosed with IGA nephropathy, minimal IFTA and arterionephrosclerosis. There was no activity on biopsy and delaying immunotherapy; working on proteinuria reduction, lipid management and BP control. He is instructed to avoid NSAIDs, contrast and fleet enemas.   His most recent creatinine through [facility] was 1.72 in Jan 2018. He had other labs done about a month ago, including checking his calcium.   Refill for temovate cream He complains having a rash in the folds of his groin when he gets heated and sweating. He denies trying other creams and lotions for this issue.   Upper back pain He also mentions upper back pain after working a few hours. He states this has been ongoing for a couple of weeks. Even when sitting still in the office, the area feels achy. He denies any  falls or injuries to the area. He hasn't taken medication yet because he didn't have relief with tylenol in the past. He's followed by neurologist for his neck.   Cough He's had cough ongoing for more than 20 years. He's noticed more coughs recently for past 6 months. He denies history of smoking. He denies coughing up blood.   Unexpected weight loss/fatigue He reports losing about 10 lbs in the last 3 weeks. He does note eating only breakfast and no more meals a few days in the last few weeks.   He also mentions feeling fatigue with low energy ongoing for years. He denies dark, tarry stools or blood in stools. He hasn't had colonoscopy in last 10 years.   Wt Readings from Last 3 Encounters:  11/10/16 177 lb (80.3 kg)  04/11/16 180 lb (81.6 kg)  01/21/16 180 lb 12.8 oz (82 kg)    Patient Active Problem List   Diagnosis Date Noted  . Microalbuminuria 10/16/2015  . Facial numbness   . Hypomagnesemia   . Hypocalcemia 10/08/2015  . Psoriasis 11/21/2013  . S/P CABG x 4 03/14/2013  . Multiple thoracic/lumbar transverse process fractures 12/15/2012  . Fall from roof 12/15/2012  . CAD (coronary artery disease) 12/15/2012  . GERD (gastroesophageal reflux disease) 12/15/2012  . HTN (hypertension) 12/15/2012  . Hyperlipidemia 12/15/2012  . Multiple bilateral rib fractures    Past Medical History:  Diagnosis Date  . Broken back   .  CAD (coronary artery disease)    PCI & CABG  . CHF (congestive heart failure) ([facility])   . Diverticulitis    mild - approx 2004  . Family history of heart disease   . GERD (gastroesophageal reflux disease)   . History of nuclear stress test 06/30/2011   lexiscan; normal pattern of perfusion post-stress; low risk scan   . Hyperlipidemia   . Hypertension   . Irregular heart beat   . Systemic hypertension    Past Surgical History:  Procedure Laterality Date  . CARDIAC CATHETERIZATION  08/1995   PTCA of OM (Dr. Marella Chimes)  . CORONARY ARTERY BYPASS GRAFT   01/2000   x5 - LIMA to LAD, SVG to diagonal, sequential SVG to ramus & OM, SVG to PDA (Dr. Tharon Aquas Trigt)  . LUNG LOBECTOMY     left upper lobe  . TRANSTHORACIC ECHOCARDIOGRAM  12/24/2012   EF 55-60%, mild LVH, mild conc hypertrophy; mild MR; LA mildly dilated   No Known Allergies Prior to Admission medications   Medication Sig Start Date End Date Taking? Authorizing Provider  amLODipine (NORVASC) 5 MG tablet TAKE ONE TABLET BY MOUTH ONE TIME DAILY 01/12/15  Yes Hilty, Nadean Corwin, MD  aspirin 325 MG tablet Take 325 mg by mouth daily.   Yes [provider]  atenolol (TENORMIN) 50 MG tablet TAKE ONE TABLET BY MOUTH ONE TIME DAILY 04/10/15  Yes Hilty, Nadean Corwin, MD  calcium carbonate (TUMS - DOSED IN MG ELEMENTAL CALCIUM) 500 MG chewable tablet Chew 1 tablet (200 mg of elemental calcium total) by mouth 3 (three) times daily with meals. 10/09/15  Yes Domenic Polite, MD  clobetasol cream (TEMOVATE) 9.48 % Apply 1 application topically 2 (two) times daily as needed. Patient taking differently: Apply 1 application topically 2 (two) times daily as needed (for rash).  11/21/13  Yes Hilty, Nadean Corwin, MD  famotidine (PEPCID) 20 MG tablet Take 1 tablet (20 mg total) by mouth 2 (two) times daily. 01/21/16  Yes Wardell Honour, MD  fluticasone [facility]) 50 MCG/ACT nasal spray USE TWO SPRAYS IN [facility] NOSTRIL DAILY  04/24/14  Yes Hilty, Nadean Corwin, MD  Magnesium Oxide 400 MG CAPS Take 1 capsule (400 mg total) by mouth 2 (two) times daily. 10/09/15  Yes Domenic Polite, MD  MAGNESIUM-OXIDE 400 (241.3 Mg) MG tablet Take 1 tablet by mouth 2 (two) times daily. 10/09/15  Yes [provider]  simvastatin (ZOCOR) 80 MG tablet TAKE 1 TABLET BY MOUTH AT BEDTIME. 04/10/15  Yes Hilty, Nadean Corwin, MD  valsartan (DIOVAN) 160 MG tablet TAKE 1 TABLET BY MOUTH ONE TIME DAILY. 11/17/14  Yes Hilty, Nadean Corwin, MD  Vitamin D, Ergocalciferol, (DRISDOL) 50000 units CAPS capsule TAKE 1 CAPSULE (50,000 UNITS TOTAL) BY MOUTH ONCE  A WEEK. 11/04/16  Yes Jeffery, Chelle, PA-C  potassium chloride SA (K-DUR,KLOR-CON) 20 MEQ tablet Take 2 tablets (40 mEq total) by mouth daily. Patient not taking: Reported on 11/10/2016 10/09/15   Domenic Polite, MD   Social History   Social History  . Marital status: Single    Spouse name: n/a  . Number of children: 0  . Years of education: N/A   Occupational History  . Chief Strategy Officer, [employer]    Social History Main Topics  . Smoking status: Never Smoker  . Smokeless tobacco: Never Used  . Alcohol use Yes     Comment: rarely  . Drug use: No  . Sexual activity: Not on file   Other Topics Concern  .  Not on file   Social History Narrative   Lives alone.   Sister-in-Law lives next door.   Review of Systems  Constitutional: Negative for fatigue and unexpected weight change.  Eyes: Negative for visual disturbance.  Respiratory: Negative for cough, chest tightness and shortness of breath.   Cardiovascular: Negative for chest pain, palpitations and leg swelling.  Gastrointestinal: Negative for abdominal pain and blood in stool.  Musculoskeletal: Positive for back pain.  Skin: Positive for rash.  Neurological: Negative for dizziness, light-headedness and headaches.       Objective:   Physical Exam  Cardiovascular: Normal rate, regular rhythm and normal heart sounds.  Exam reveals no gallop and no friction rub.   No murmur heard. Pulmonary/Chest: Effort normal and breath sounds normal. No respiratory distress.  Abdominal: Soft. He exhibits no distension. There is no tenderness.  Musculoskeletal:  No midline bony tenderness in his upper back C-spine: slightly decreased rotation, as well as decreased flexion and extension, but has reproducible pain   Neurological:  Reflex Scores:      Bicep reflexes are 2+ on the right side and 2+ on the left side.      Brachioradialis reflexes are 2+ on the right side and 2+ on the left side.   Vitals:   11/10/16 0903  BP:  134/75  Pulse: (!) 58  Resp: 18  Temp: 98.1 F (36.7 C)  TempSrc: Oral  SpO2: 98%  Weight: 177 lb (80.3 kg)  Height: 5\' 9"  (1.753 m)    Dg Chest 2 View  Result Date: 11/10/2016 CLINICAL DATA:  Cough EXAM: CHEST  2 VIEW COMPARISON:  12/17/2012 FINDINGS: Cardiac shadow is stable. Postsurgical changes are again seen. The lungs are well aerated bilaterally. No focal infiltrate or sizable effusion is seen. Degenerative changes of the thoracic spine are noted. IMPRESSION: No acute abnormality noted. Electronically Signed   By: Inez Catalina M.D.   On: 11/10/2016 10:37       Assessment & Plan:   Jared White is a 71 y.o. male Loss of weight - Plan: CBC, TSH, Sedimentation Rate, Care order/instruction: Cough - Plan: DG Chest 2 View  - Check TSH, CBC, sedimentation rate. No concerning findings on chest x-ray.  -Follow-up in the next few weeks to discuss weight loss and cough further. Possible allergic versus reflux cause of cough, could try over-the-counter treatments initially.  Tinea cruris - Plan: clotrimazole-betamethasone (LOTRISONE) cream  - Change to move a 2 Lotrisone cream for antifungal component. RTC precautions if worsening or not improving with this treatment  Upper back pain  - Likely due to degenerative changes/arthritis of neck. No apparent weakness. Try Tylenol over-the-counter, and discuss further at follow-up in the next few weeks to determine if imaging needed. RTC sooner if worse  Hypocalcemia  - Followed by [facility], advised to double check his most recent blood work was okay. [place] up as planned with nephrologist.  Fatigue, unspecified type - Plan: CBC, Sedimentation Rate  - Check CBC, TSH, sedimentation rate, follow-up as above.  Screen for colon cancer - Plan: Ambulatory referral to Gastroenterology  - Referred to [facility] neurology, stressed importance of screening exam, especially with weight loss.    Meds ordered this encounter    Medications  . clotrimazole-betamethasone (LOTRISONE) cream    Sig: Apply 1 application topically 2 (two) times daily.    Dispense:  30 g    Refill:  0   Patient Instructions   I would recommend checking with [facility] to make sure your last calcium was ok. Let me know if you need my assistance.   Try the new cream for rash in groin if it returns.   I will check a chest x-ray today for your cough and weight loss as well as blood work. Follow-up to discuss fatigue, weight loss, and your other symptoms further in the next few weeks. Make sure you're drinking plenty of fluids throughout the day, as well as eating regular meals to help with fatigue and weight loss.  Tylenol is safest for neck and upper back pain for now. We can discuss other possible medications next time if the Tylenol is not helping.  I will place a referral to gastroenterology for colon cancer screening.  Return to the clinic or go to the nearest emergency room if any of your symptoms worsen or new symptoms occur.   Jock Itch Jock itch (tinea cruris) is a fungal infection of the skin in the groin area. It is sometimes called ringworm, even though it is not caused by worms. It is caused by a fungus, which is a type of germ that thrives in dark, damp places. Jock itch causes a rash and itching in the groin and upper thigh area. It usually goes away in 2-3 weeks with treatment. What are the causes? The fungus that causes jock itch may be spread by:  Touching a fungus infection elsewhere on your body-such as athlete's foot-and then touching your groin area.  Sharing towels or clothing with an infected person.  What increases the risk? Jock itch is most common in men and adolescent boys. This condition is more likely to develop from:  Being in hot, humid climates.  Wearing tight-fitting clothing or wet bathing suits for long periods of time.  Participating in sports.  Being overweight.  Having  diabetes.  What are the signs or symptoms? Symptoms of jock itch may include:  A red, pink, or brown rash in the groin area. The rash may spread to the thighs, anus, and buttocks.  Dry and scaly skin on or around the rash.  Itchiness.  How is this diagnosed? Most often, a health care provider can make the diagnosis by looking at your rash. Sometimes, a scraping of the infected skin will be taken. This sample may be tested by looking at it under a microscope or by trying to grow the fungus from the sample (culture). How is this treated? Treatment for this condition may include:  Antifungal medicine to kill the fungus. This may be in various forms: ? Skin cream or ointment. ? Medicine taken by mouth.  Skin cream or ointment to reduce the itching.  Compresses or medicated powders to dry the infected skin.  Follow these instructions at home:  Take medicines only as directed by your health care provider. Apply skin creams or ointments exactly as directed.  Wear loose-fitting clothing. ? Men should wear cotton boxer shorts. ? Women should wear cotton underwear.  Change your underwear every day to keep your groin dry.  Avoid hot baths.  Dry your groin area well after bathing. ? Use a separate towel to dry your groin area. This will help to prevent a spreading of the infection to other areas of your body.  Do not scratch the affected area.  Do not share towels with other people. Contact a health care provider if:  Your rash does not improve or it gets worse after 2 weeks of treatment.  Your rash is spreading.  Your  rash returns after treatment is finished.  You have a fever.  You have redness, swelling, or pain in the area around your rash.  You have fluid, blood, or pus coming from your rash.  Your have your rash for more than 4 weeks. This information is not intended to replace advice given to you by your health care provider. Make sure you discuss any questions you  have with your health care provider. Document Released: 04/29/2002 Document Revised: 10/15/2015 Document Reviewed: 02/18/2014 Elsevier Interactive Patient Education  2018 [employer].    IF you received an x-ray today, you will receive an invoice from [facility]. Please contact [facility] at [phone] with questions or concerns regarding your invoice.   IF you received labwork today, you will receive an invoice from [facility]. Please contact [facility] at [phone] with questions or concerns regarding your invoice.   Our billing staff will not be able to assist you with questions regarding bills from these companies.  You will be contacted with the lab results as soon as they are available. The fastest way to get your results is to activate your My Chart account. Instructions are located on the last page of this paperwork. If you have not heard from [place] regarding the results in 2 weeks, please contact this office.      I personally performed the services described in this documentation, which was scribed in my presence. The recorded information has been reviewed and considered for accuracy and completeness, addended by me as needed, and agree with information above.  Signed,   [name], MD [facility].  11/11/16 9:25 AM

## 2016-11-11 ENCOUNTER — Encounter: Payer: Self-pay | Admitting: Gastroenterology

## 2016-11-11 LAB — CBC
Hematocrit: 40.6 % (ref 37.5–51.0)
Hemoglobin: 13.3 g/dL (ref 13.0–17.7)
MCH: 30.4 pg (ref 26.6–33.0)
MCHC: 32.8 g/dL (ref 31.5–35.7)
MCV: 93 fL (ref 79–97)
Platelets: 165 10*3/uL (ref 150–379)
RBC: 4.37 x10E6/uL (ref 4.14–5.80)
RDW: 13.6 % (ref 12.3–15.4)
WBC: 8.5 10*3/uL (ref 3.4–10.8)

## 2016-11-11 LAB — TSH: TSH: 2.12 u[IU]/mL (ref 0.450–4.500)

## 2016-11-11 LAB — SEDIMENTATION RATE: Sed Rate: 10 mm/hr (ref 0–30)

## 2016-11-30 ENCOUNTER — Other Ambulatory Visit: Payer: Self-pay | Admitting: Physician Assistant

## 2016-11-30 DIAGNOSIS — E559 Vitamin D deficiency, unspecified: Secondary | ICD-10-CM

## 2016-12-01 ENCOUNTER — Ambulatory Visit (INDEPENDENT_AMBULATORY_CARE_PROVIDER_SITE_OTHER): Payer: Medicare Other | Admitting: Family Medicine

## 2016-12-01 ENCOUNTER — Encounter: Payer: Self-pay | Admitting: Family Medicine

## 2016-12-01 VITALS — BP 114/65 | HR 56 | Temp 98.4°F | Resp 16 | Ht 69.0 in | Wt 178.0 lb

## 2016-12-01 DIAGNOSIS — R5383 Other fatigue: Secondary | ICD-10-CM

## 2016-12-01 DIAGNOSIS — J309 Allergic rhinitis, unspecified: Secondary | ICD-10-CM

## 2016-12-01 DIAGNOSIS — E559 Vitamin D deficiency, unspecified: Secondary | ICD-10-CM

## 2016-12-01 DIAGNOSIS — I2510 Atherosclerotic heart disease of native coronary artery without angina pectoris: Secondary | ICD-10-CM | POA: Diagnosis not present

## 2016-12-01 DIAGNOSIS — N183 Chronic kidney disease, stage 3 unspecified: Secondary | ICD-10-CM

## 2016-12-01 DIAGNOSIS — R7989 Other specified abnormal findings of blood chemistry: Secondary | ICD-10-CM

## 2016-12-01 MED ORDER — CHOLECALCIFEROL 1.25 MG (50000 UT) PO CAPS: 50000.0000 [IU] | ORAL_CAPSULE | ORAL | 2 refills | Status: DC

## 2016-12-01 MED ORDER — CHOLECALCIFEROL 1.25 MG (50000 UT) PO CAPS: 50000.0000 [IU] | ORAL_CAPSULE | Freq: Every day | ORAL | 2 refills | Status: DC

## 2016-12-01 MED ORDER — FLUTICASONE PROPIONATE 50 MCG/ACT NA SUSP: 2.0000 | Freq: Every day | NASAL | 0 refills | Status: DC

## 2016-12-01 NOTE — Patient Instructions (Addendum)
I would like you to meet with a cardiologist. Let me know the name of the cardiologist you would like to see and I will be happy to refer you.  As we discussed with history of heart disease, and fatigue, that could be coming from your heart.  If you have any chest pains, worsening shortness of breath or fatigue with exertion, would recommend evaluation right away here or the emergency room if needed.  Flonase refilled for allergies. Allergies may be a cause of your cough, as chest x-ray was reassuring last visit. If any worsening cough or shortness of breath, please return to discuss further.  I will check vitamin d level, but if high or normal, you may not ned high dose pill.  We will let you know.   I also checked magnesium level today.  If you would to to discuss neck pain and treatment options - please follow up to discuss that further.   If weight does not continue to increase, or any worsening fatigue - return to look into other causes.    Fatigue Fatigue is feeling tired all of the time, a lack of energy, or a lack of motivation. Occasional or mild fatigue is often a normal response to activity or life in general. However, long-lasting (chronic) or extreme fatigue may indicate an underlying medical condition. Follow these instructions at home: Watch your fatigue for any changes. The following actions may help to lessen any discomfort you are feeling:  Talk to your health care provider about how much sleep you need each night. Try to get the required amount every night.  Take medicines only as directed by your health care provider.  Eat a healthy and nutritious diet. Ask your health care provider if you need help changing your diet.  Drink enough fluid to keep your urine clear or pale yellow.  Practice ways of relaxing, such as yoga, meditation, massage therapy, or acupuncture.  Exercise regularly.  Change situations that cause you stress. Try to keep your work and personal  routine reasonable.  Do not abuse illegal drugs.  Limit alcohol intake to no more than 1 drink per day for nonpregnant women and 2 drinks per day for men. One drink equals 12 ounces of beer, 5 ounces of wine, or 1 ounces of hard liquor.  Take a multivitamin, if directed by your health care provider.  Contact a health care provider if:  Your fatigue does not get better.  You have a fever.  You have unintentional weight loss or gain.  You have headaches.  You have difficulty: ? Falling asleep. ? Sleeping throughout the night.  You feel angry, guilty, anxious, or sad.  You are unable to have a bowel movement (constipation).  You skin is dry.  Your legs or another part of your body is swollen. Get help right away if:  You feel confused.  Your vision is blurry.  You feel faint or pass out.  You have a severe headache.  You have severe abdominal, pelvic, or back pain.  You have chest pain, shortness of breath, or an irregular or fast heartbeat.  You are unable to urinate or you urinate less than normal.  You develop abnormal bleeding, such as bleeding from the rectum, vagina, nose, lungs, or nipples.  You vomit blood.  You have thoughts about harming yourself or committing suicide.  You are worried that you might harm someone else. This information is not intended to replace advice given to you by your health care  provider. Make sure you discuss any questions you have with your health care provider. Document Released: 03/06/2007 Document Revised: 10/15/2015 Document Reviewed: 09/10/2013 Elsevier Interactive Patient Education  2018 [employer].   IF you received an x-ray today, you will receive an invoice from [facility]. Please contact [facility] at [phone] with questions or concerns regarding your invoice.   IF you received labwork today, you will receive an invoice from [facility]. Please contact [facility] at [phone] with questions  or concerns regarding your invoice.   Our billing staff will not be able to assist you with questions regarding bills from these companies.  You will be contacted with the lab results as soon as they are available. The fastest way to get your results is to activate your My Chart account. Instructions are located on the last page of this paperwork. If you have not heard from [place] regarding the results in 2 weeks, please contact this office.

## 2016-12-01 NOTE — Progress Notes (Signed)
Subjective:  By signing my name below, I, Jared White, attest that this documentation has been prepared under the direction and in the presence of Jared Ray, MD. Electronically Signed: [name], [place]. 12/01/2016 , 2:07 PM .  Patient was seen in Room 25 .   Patient ID: Jared White, male    DOB: 16-Mar-1946, 71 y.o.   MRN: [id number] Chief Complaint  Patient presents with  . Fatigue    states he feels the same  . Weight Loss    states he has gained some weight since last OV  . Cough    states the cough is still there  . Follow-up  . Medication Refill    Vitamin D and Flonase   HPI Jared White is a 71 y.o. male Here for follow up of fatigue. See last visit with loss of weight on June 21st, multiple concerns were addressed at that time as well. Reported about 10 lb weight loss in the last 3 weeks, but his weight was actually 180 in Nov 2017 and only loss of 3 lbs since that time. He had a normal TSH, sed rate, CBC, chest xray was also obtained due to cough without acute abnormality. Did refer him to GI for colonoscopy for colon cancer screening.   Wt Readings from Last 3 Encounters:  12/01/16 178 lb (80.7 kg)  11/10/16 177 lb (80.3 kg)  04/11/16 180 lb (81.6 kg)   He had noted ongoing cough for more than 20 years, but possible increased cough past 6 months. Does have history of left lung upper lobectomy due to cyst. Also has history of cardiac disease and chronic kidney disease. His nephrologist is Dr. Hollie Salk with last visit in Jan stage G3A. Chronic kidney disease with hypertension and nephropathy.   He is scheduled for colonoscopy on Aug 1st at [facility]. He last saw his cardiologist (Dr. Debara Pickett) a few years ago, but stopped going "due to attitude". He denies any chest pains, shortness of breath or angina. He is followed by a neurologist for his neck pain; notes pain has been there since 1969. He reports seeing his nephrologist last than 3 months ago, and mentions his  magnesium level was normal.   Allergic Rhinitis He requests refill of his flonase.   He states his cough improves with flonase. He uses 2 puffs in each nostril once a day. He denies epistaxis.    Vitamin D He is also requesting vitamin D refill; last level of 12.8 in May 2017, but also with hypocalcemia at that time, and severe hypomagnesia; treated inpatient.   He states he takes magnesium 500mg  QD and Vitamin D 50000 units once a week. He hasn't missed any doses, last dose last Friday (6 days ago).   Patient Active Problem List   Diagnosis Date Noted  . Microalbuminuria 10/16/2015  . Facial numbness   . Hypomagnesemia   . Hypocalcemia 10/08/2015  . Psoriasis 11/21/2013  . S/P CABG x 4 03/14/2013  . Multiple thoracic/lumbar transverse process fractures 12/15/2012  . Fall from roof 12/15/2012  . CAD (coronary artery disease) 12/15/2012  . GERD (gastroesophageal reflux disease) 12/15/2012  . HTN (hypertension) 12/15/2012  . Hyperlipidemia 12/15/2012  . Multiple bilateral rib fractures    Past Medical History:  Diagnosis Date  . Broken back   . CAD (coronary artery disease)    PCI & CABG  . CHF (congestive heart failure) ([place])   . Diverticulitis    mild - approx 2004  . Family  history of heart disease   . GERD (gastroesophageal reflux disease)   . History of nuclear stress test 06/30/2011   lexiscan; normal pattern of perfusion post-stress; low risk scan   . Hyperlipidemia   . Hypertension   . Irregular heart beat   . Systemic hypertension    Past Surgical History:  Procedure Laterality Date  . CARDIAC CATHETERIZATION  08/1995   PTCA of OM (Dr. Marella Chimes)  . CORONARY ARTERY BYPASS GRAFT  01/2000   x5 - LIMA to LAD, SVG to diagonal, sequential SVG to ramus & OM, SVG to PDA (Dr. Tharon Aquas Trigt)  . LUNG LOBECTOMY     left upper lobe  . TRANSTHORACIC ECHOCARDIOGRAM  12/24/2012   EF 55-60%, mild LVH, mild conc hypertrophy; mild MR; LA mildly dilated   No Known  Allergies Prior to Admission medications   Medication Sig Start Date End Date Taking? Authorizing Provider  amLODipine (NORVASC) 5 MG tablet TAKE ONE TABLET BY MOUTH ONE TIME DAILY 01/12/15  Yes Hilty, Nadean Corwin, MD  aspirin 325 MG tablet Take 325 mg by mouth daily.   Yes [provider]  atenolol (TENORMIN) 50 MG tablet TAKE ONE TABLET BY MOUTH ONE TIME DAILY 04/10/15  Yes Hilty, Nadean Corwin, MD  calcium carbonate (TUMS - DOSED IN MG ELEMENTAL CALCIUM) 500 MG chewable tablet Chew 1 tablet (200 mg of elemental calcium total) by mouth 3 (three) times daily with meals. 10/09/15  Yes Domenic Polite, MD  Cholecalciferol (VITAMIN D PO) Take 1.25 mg by mouth once a week.   Yes [provider]  clotrimazole-betamethasone (LOTRISONE) cream Apply 1 application topically 2 (two) times daily. 11/10/16  Yes [name], MD  famotidine (PEPCID) 20 MG tablet Take 1 tablet (20 mg total) by mouth 2 (two) times daily. 01/21/16  Yes Wardell Honour, MD  fluticasone [facility]) 50 MCG/ACT nasal spray USE TWO SPRAYS IN [facility] NOSTRIL DAILY  04/24/14  Yes Hilty, Nadean Corwin, MD  Magnesium Oxide 400 MG CAPS Take 1 capsule (400 mg total) by mouth 2 (two) times daily. 10/09/15  Yes Domenic Polite, MD  MAGNESIUM-OXIDE 400 (241.3 Mg) MG tablet Take 1 tablet by mouth 2 (two) times daily. 10/09/15  Yes [provider]  potassium chloride SA (K-DUR,KLOR-CON) 20 MEQ tablet Take 2 tablets (40 mEq total) by mouth daily. 10/09/15  Yes Domenic Polite, MD  simvastatin (ZOCOR) 80 MG tablet TAKE 1 TABLET BY MOUTH AT BEDTIME. 04/10/15  Yes Hilty, Nadean Corwin, MD  valsartan (DIOVAN) 160 MG tablet TAKE 1 TABLET BY MOUTH ONE TIME DAILY. 11/17/14  Yes Hilty, Nadean Corwin, MD  Vitamin D, Ergocalciferol, (DRISDOL) 50000 units CAPS capsule TAKE 1 CAPSULE (50,000 UNITS TOTAL) BY MOUTH ONCE A WEEK. 11/04/16  Yes Harrison Mons, PA-C   Social History   Social History  . Marital status: Single    Spouse name: n/a  . Number of  children: 0  . Years of education: N/A   Occupational History  . Chief Strategy Officer, [employer]    Social History Main Topics  . Smoking status: Never Smoker  . Smokeless tobacco: Never Used  . Alcohol use Yes     Comment: rarely  . Drug use: No  . Sexual activity: Not on file   Other Topics Concern  . Not on file   Social History Narrative   Lives alone.   Sister-in-Law lives next door.   Review of Systems  Constitutional: Negative for fatigue and unexpected weight change.  HENT: Negative for  nosebleeds.   Eyes: Negative for visual disturbance.  Respiratory: Positive for cough. Negative for chest tightness and shortness of breath.   Cardiovascular: Negative for chest pain, palpitations and leg swelling.  Gastrointestinal: Negative for abdominal pain and White in stool.  Musculoskeletal: Positive for neck pain.  Neurological: Negative for dizziness, light-headedness and headaches.       Objective:   Physical Exam  Constitutional: He is oriented to person, place, and time. He appears well-developed and well-nourished.  HENT:  Head: Normocephalic and atraumatic.  Eyes: Pupils are equal, round, and reactive to light. EOM are normal.  Neck: No JVD present. Carotid bruit is not present.  Cardiovascular: Normal rate, regular rhythm and normal heart sounds.   No murmur heard. Pulmonary/Chest: Effort normal and breath sounds normal. He has no rales.  Musculoskeletal: He exhibits no edema.  Neurological: He is alert and oriented to person, place, and time.  Reflex Scores:      Patellar reflexes are 2+ on the right side and 2+ on the left side.      Achilles reflexes are 2+ on the right side and 2+ on the left side. No clonus  Skin: Skin is warm and dry.  Psychiatric: He has a normal mood and affect.  Vitals reviewed.   Vitals:   12/01/16 1318  BP: 114/65  Pulse: (!) 56  Resp: 16  Temp: 98.4 F (36.9 C)  TempSrc: Oral  SpO2: 97%  Weight: 178 lb (80.7 kg)   Height: 5\' 9"  (1.753 m)      Assessment & Plan:    Erica Osuna Crable is a 71 y.o. male Coronary artery disease involving native heart without angina pectoris, unspecified vessel or lesion type Other fatigue - Plan: Basic metabolic panel, Magnesium, VITAMIN D 25 Hydroxy (Vit-D Deficiency, Fractures), Cholecalciferol 50000 units capsule, DISCONTINUED: Cholecalciferol 50000 units capsule  - Previous White work reassuring. Will check BMP, magnesium, vitamin D with history of hypomagnesemia and not sure when he did have these tests most recently with nephrologist.  -With history of CAD, did recommend follow-up with cardiology as he has not seen them in some time, and cardiac source of fatigue possible. ER/911 precautions if acute worsening of symptoms.  Hypomagnesemia - Plan: Magnesium  CKD (chronic kidney disease) stage 3, GFR 30-59 ml/min - Plan: Basic metabolic panel  - Followed by nephrology, but check BMP to make sure there has not been an acute change in his GFR with fatigue. Of note his weight has been stable, and actually up 1 pound from last visit.  Low vitamin D level - Plan: VITAMIN D 25 Hydroxy (Vit-D Deficiency, Fractures)  - Check vitamin D level. Reordered high-dose vitamin D once per week, but may not need high dose at this time if his magnesium is controlled.  Allergic rhinitis, unspecified seasonality, unspecified trigger - Plan: fluticasone (FLONASE) 50 MCG/ACT nasal spray  -Refilled Flonase, controlled. May have some component of allergies triggering cough. RTC precautions if persistent/worsening  Meds ordered this encounter  Medications  . DISCONTD: Cholecalciferol (VITAMIN D PO)    Sig: Take 1.25 mg by mouth once a week.  Marland Kitchen DISCONTD: Cholecalciferol 50000 units capsule    Sig: Take 1 capsule (50,000 Units total) by mouth daily.    Dispense:  4 capsule    Refill:  2  . fluticasone (FLONASE) 50 MCG/ACT nasal spray    Sig: Place 2 sprays into both nostrils daily.     Dispense:  16 g    Refill:  0  .  Cholecalciferol 50000 units capsule    Sig: Take 1 capsule (50,000 Units total) by mouth once a week.    Dispense:  4 capsule    Refill:  2   Patient Instructions   I would like you to meet with a cardiologist. Let me know the name of the cardiologist you would like to see and I will be happy to refer you.  As we discussed with history of heart disease, and fatigue, that could be coming from your heart.  If you have any chest pains, worsening shortness of breath or fatigue with exertion, would recommend evaluation right away here or the emergency room if needed.  Flonase refilled for allergies. Allergies may be a cause of your cough, as chest x-White was reassuring last visit. If any worsening cough or shortness of breath, please return to discuss further.  I will check vitamin d level, but if high or normal, you may not ned high dose pill.  We will let you know.   I also checked magnesium level today.  If you would to to discuss neck pain and treatment options - please follow up to discuss that further.   If weight does not continue to increase, or any worsening fatigue - return to look into other causes.    Fatigue Fatigue is feeling tired all of the time, a lack of energy, or a lack of motivation. Occasional or mild fatigue is often a normal response to activity or life in general. However, long-lasting (chronic) or extreme fatigue may indicate an underlying medical condition. Follow these instructions at home: Watch your fatigue for any changes. The following actions may help to lessen any discomfort you are feeling:  Talk to your health care provider about how much sleep you need each night. Try to get the required amount every night.  Take medicines only as directed by your health care provider.  Eat a healthy and nutritious diet. Ask your health care provider if you need help changing your diet.  Drink enough fluid to keep your urine clear or  pale yellow.  Practice ways of relaxing, such as yoga, meditation, massage therapy, or acupuncture.  Exercise regularly.  Change situations that cause you stress. Try to keep your work and personal routine reasonable.  Do not abuse illegal drugs.  Limit alcohol intake to no more than 1 drink per day for nonpregnant women and 2 drinks per day for men. One drink equals 12 ounces of beer, 5 ounces of wine, or 1 ounces of hard liquor.  Take a multivitamin, if directed by your health care provider.  Contact a health care provider if:  Your fatigue does not get better.  You have a fever.  You have unintentional weight loss or gain.  You have headaches.  You have difficulty: ? Falling asleep. ? Sleeping throughout the night.  You feel angry, guilty, anxious, or sad.  You are unable to have a bowel movement (constipation).  You skin is dry.  Your legs or another part of your body is swollen. Get help right away if:  You feel confused.  Your vision is blurry.  You feel faint or pass out.  You have a severe headache.  You have severe abdominal, pelvic, or back pain.  You have chest pain, shortness of breath, or an irregular or fast heartbeat.  You are unable to urinate or you urinate less than normal.  You develop abnormal bleeding, such as bleeding from the rectum, vagina, nose, lungs, or nipples.  You vomit White.  You have thoughts about harming yourself or committing suicide.  You are worried that you might harm someone else. This information is not intended to replace advice given to you by your health care provider. Make sure you discuss any questions you have with your health care provider. Document Released: 03/06/2007 Document Revised: 10/15/2015 Document Reviewed: 09/10/2013 Elsevier Interactive Patient Education  2018 [employer].   IF you received an x-White today, you will receive an invoice from [facility]. Please contact [facility]  Radiology at [phone] with questions or concerns regarding your invoice.   IF you received labwork today, you will receive an invoice from [place]. Please contact [facility] at [phone] with questions or concerns regarding your invoice.   Our billing staff will not be able to assist you with questions regarding bills from these companies.  You will be contacted with the lab results as soon as they are available. The fastest way to get your results is to activate your My Chart account. Instructions are located on the last page of this paperwork. If you have not heard from [place] regarding the results in 2 weeks, please contact this office.       I personally performed the services described in this documentation, which was scribed in my presence. The recorded information has been reviewed and considered for accuracy and completeness, addended by me as needed, and agree with information above.  Signed,   Jared Ray, MD [facility] [place].  12/02/16 3:00 PM

## 2016-12-02 LAB — BASIC METABOLIC PANEL
BUN/Creatinine Ratio: 15 (ref 10–24)
BUN: 37 mg/dL — ABNORMAL HIGH (ref 8–27)
CO2: 20 mmol/L (ref 20–29)
Calcium: 9.7 mg/dL (ref 8.6–10.2)
Chloride: 105 mmol/L (ref 96–106)
Creatinine, Ser: 2.43 mg/dL — ABNORMAL HIGH (ref 0.76–1.27)
GFR calc Af Amer: 30 mL/min/{1.73_m2} — ABNORMAL LOW (ref >59)
GFR calc non Af Amer: 26 mL/min/{1.73_m2} — ABNORMAL LOW (ref >59)
Glucose: 90 mg/dL (ref 65–99)
Potassium: 5.5 mmol/L — ABNORMAL HIGH (ref 3.5–5.2)
Sodium: 141 mmol/L (ref 134–144)

## 2016-12-02 LAB — VITAMIN D 25 HYDROXY (VIT D DEFICIENCY, FRACTURES): Vit D, 25-Hydroxy: 42.7 ng/mL (ref 30.0–100.0)

## 2016-12-02 LAB — MAGNESIUM: Magnesium: 2.2 mg/dL (ref 1.6–2.3)

## 2016-12-07 ENCOUNTER — Other Ambulatory Visit: Payer: Self-pay | Admitting: Family Medicine

## 2016-12-07 DIAGNOSIS — E875 Hyperkalemia: Secondary | ICD-10-CM

## 2016-12-07 DIAGNOSIS — R7989 Other specified abnormal findings of blood chemistry: Secondary | ICD-10-CM

## 2016-12-07 NOTE — Progress Notes (Signed)
See lab notes. Elevated creat from prior reading and elevated potassium.  Lab only order placed.

## 2016-12-09 ENCOUNTER — Ambulatory Visit: Payer: Medicare Other | Admitting: *Deleted

## 2016-12-09 ENCOUNTER — Encounter: Payer: Self-pay | Admitting: Gastroenterology

## 2016-12-09 ENCOUNTER — Ambulatory Visit (INDEPENDENT_AMBULATORY_CARE_PROVIDER_SITE_OTHER): Payer: Medicare Other | Admitting: Family Medicine

## 2016-12-09 VITALS — Ht 69.0 in | Wt 179.0 lb

## 2016-12-09 DIAGNOSIS — R7989 Other specified abnormal findings of blood chemistry: Secondary | ICD-10-CM | POA: Diagnosis not present

## 2016-12-09 DIAGNOSIS — E875 Hyperkalemia: Secondary | ICD-10-CM | POA: Diagnosis not present

## 2016-12-09 DIAGNOSIS — Z1211 Encounter for screening for malignant neoplasm of colon: Secondary | ICD-10-CM

## 2016-12-09 LAB — BASIC METABOLIC PANEL
BUN/Creatinine Ratio: 15 (ref 10–24)
BUN: 29 mg/dL — ABNORMAL HIGH (ref 8–27)
CO2: 21 mmol/L (ref 20–29)
Calcium: 9.6 mg/dL (ref 8.6–10.2)
Chloride: 105 mmol/L (ref 96–106)
Creatinine, Ser: 2 mg/dL — ABNORMAL HIGH (ref 0.76–1.27)
GFR calc Af Amer: 38 mL/min/{1.73_m2} — ABNORMAL LOW (ref >59)
GFR calc non Af Amer: 33 mL/min/{1.73_m2} — ABNORMAL LOW (ref >59)
Glucose: 128 mg/dL — ABNORMAL HIGH (ref 65–99)
Potassium: 5.2 mmol/L (ref 3.5–5.2)
Sodium: 140 mmol/L (ref 134–144)

## 2016-12-09 MED ORDER — NA SULFATE-K SULFATE-MG SULF 17.5-3.13-1.6 GM/177ML PO SOLN: 1.0000 | Freq: Once | ORAL | 0 refills | Status: AC

## 2016-12-09 NOTE — Progress Notes (Signed)
Denies allergies to eggs or soy products. Denies complications with sedation or anesthesia. Denies O2 use. Denies use of diet or weight loss medications.  Emmi instructions given for colonoscopy.  

## 2016-12-11 NOTE — Progress Notes (Signed)
Lab only visit 

## 2016-12-23 ENCOUNTER — Encounter: Payer: Medicare Other | Admitting: Gastroenterology

## 2017-01-05 ENCOUNTER — Ambulatory Visit (INDEPENDENT_AMBULATORY_CARE_PROVIDER_SITE_OTHER): Payer: Medicare Other | Admitting: Family Medicine

## 2017-01-05 ENCOUNTER — Encounter: Payer: Self-pay | Admitting: Family Medicine

## 2017-01-05 VITALS — BP 138/78 | HR 66 | Temp 98.3°F | Resp 16 | Ht 69.0 in | Wt 174.0 lb

## 2017-01-05 DIAGNOSIS — I2510 Atherosclerotic heart disease of native coronary artery without angina pectoris: Secondary | ICD-10-CM

## 2017-01-05 DIAGNOSIS — R238 Other skin changes: Secondary | ICD-10-CM | POA: Diagnosis not present

## 2017-01-05 DIAGNOSIS — R12 Heartburn: Secondary | ICD-10-CM | POA: Diagnosis not present

## 2017-01-05 DIAGNOSIS — R5383 Other fatigue: Secondary | ICD-10-CM | POA: Diagnosis not present

## 2017-01-05 DIAGNOSIS — R059 Cough, unspecified: Secondary | ICD-10-CM

## 2017-01-05 DIAGNOSIS — Z8639 Personal history of other endocrine, nutritional and metabolic disease: Secondary | ICD-10-CM | POA: Diagnosis not present

## 2017-01-05 DIAGNOSIS — R05 Cough: Secondary | ICD-10-CM | POA: Diagnosis not present

## 2017-01-05 DIAGNOSIS — R233 Spontaneous ecchymoses: Secondary | ICD-10-CM

## 2017-01-05 DIAGNOSIS — E559 Vitamin D deficiency, unspecified: Secondary | ICD-10-CM | POA: Diagnosis not present

## 2017-01-05 DIAGNOSIS — R7989 Other specified abnormal findings of blood chemistry: Secondary | ICD-10-CM

## 2017-01-05 NOTE — Patient Instructions (Addendum)
Cough may be due to multiple causes. Prior xray was overall ok and can hold on further testing for now. flonase every day, pepcid twice per day, but also try to avoid foods listed below that can worsen heartburn. Return to the clinic or go to the nearest emergency room if any of your symptoms worsen or new symptoms occur.  It may be worth meeting with your orthopaedist to discuss treatment options as an injection or possible physical therapy may be helpful.  Arthritis in neck can also cause some shoulder pain, so if neck is also hurting, an injection or neck treatment may also affect shoulder pain.   You can switch to over the counter vitamin D 2000 units per day instead of 50,000 units per week. Recheck levels in 6 weeks on that dose.   Continue follow up with nephrologist, but I am glad to hear that most recent testing was near your baseline.  I will refer you to cardiology.   If bruising worsens, bleeding with teeth brushing or in urine - return for recheck. I will check platelets on labs in 6 weeks.    Return for lab only visit in 6 weeks.   Return to the clinic or go to the nearest emergency room if any of your symptoms worsen or new symptoms occur.   Food Choices for Gastroesophageal Reflux Disease, Adult When you have gastroesophageal reflux disease (GERD), the foods you eat and your eating habits are very important. Choosing the right foods can help ease the discomfort of GERD. Consider working with a diet and nutrition specialist (dietitian) to help you make healthy food choices. What general guidelines should I follow? Eating plan  Choose healthy foods low in fat, such as fruits, vegetables, whole grains, low-fat dairy products, and lean meat, fish, and poultry.  Eat frequent, small meals instead of three large meals each day. Eat your meals slowly, in a relaxed setting. Avoid bending over or lying down until 2-3 hours after eating.  Limit high-fat foods such as fatty meats or  fried foods.  Limit your intake of oils, butter, and shortening to less than 8 teaspoons each day.  Avoid the following: ? Foods that cause symptoms. These may be different for different people. Keep a food diary to keep track of foods that cause symptoms. ? Alcohol. ? Drinking large amounts of liquid with meals. ? Eating meals during the 2-3 hours before bed.  Cook foods using methods other than frying. This may include baking, grilling, or broiling. Lifestyle   Maintain a healthy weight. Ask your health care provider what weight is healthy for you. If you need to lose weight, work with your health care provider to do so safely.  Exercise for at least 30 minutes on 5 or more days each week, or as told by your health care provider.  Avoid wearing clothes that fit tightly around your waist and chest.  Do not use any products that contain nicotine or tobacco, such as cigarettes and e-cigarettes. If you need help quitting, ask your health care provider.  Sleep with the head of your bed raised. Use a wedge under the mattress or blocks under the bed frame to raise the head of the bed. What foods are not recommended? The items listed may not be a complete list. Talk with your dietitian about what dietary choices are best for you. Grains Pastries or quick breads with added fat. [place] toast. Vegetables Deep fried vegetables. [place] fries. Any vegetables prepared with added fat.  Any vegetables that cause symptoms. For some people this may include tomatoes and tomato products, chili peppers, onions and garlic, and horseradish. Fruits Any fruits prepared with added fat. Any fruits that cause symptoms. For some people this may include citrus fruits, such as oranges, grapefruit, pineapple, and lemons. Meats and other protein foods High-fat meats, such as fatty beef or pork, hot dogs, ribs, ham, sausage, salami and bacon. Fried meat or protein, including fried fish and fried chicken. Nuts and nut  butters. Dairy Whole milk and chocolate milk. Sour cream. Cream. Ice cream. Cream cheese. Milk shakes. Beverages Coffee and tea, with or without caffeine. Carbonated beverages. Sodas. Energy drinks. Fruit juice made with acidic fruits (such as orange or grapefruit). Tomato juice. Alcoholic drinks. Fats and oils Butter. Margarine. Shortening. Ghee. Sweets and desserts Chocolate and cocoa. Donuts. Seasoning and other foods Pepper. Peppermint and spearmint. Any condiments, herbs, or seasonings that cause symptoms. For some people, this may include curry, hot sauce, or vinegar-based salad dressings. Summary  When you have gastroesophageal reflux disease (GERD), food and lifestyle choices are very important to help ease the discomfort of GERD.  Eat frequent, small meals instead of three large meals each day. Eat your meals slowly, in a relaxed setting. Avoid bending over or lying down until 2-3 hours after eating.  Limit high-fat foods such as fatty meat or fried foods. This information is not intended to replace advice given to you by your health care provider. Make sure you discuss any questions you have with your health care provider. Document Released: 05/09/2005 Document Revised: 05/10/2016 Document Reviewed: 05/10/2016 Elsevier Interactive Patient Education  2017 [employer] Inc[employer]  Cough, Adult Coughing is a reflex that clears your throat and your airways. Coughing helps to heal and protect your lungs. It is normal to cough occasionally, but a cough that happens with other symptoms or lasts a long time may be a sign of a condition that needs treatment. A cough may last only 2-3 weeks (acute), or it may last longer than 8 weeks (chronic). What are the causes? Coughing is commonly caused by:  Breathing in substances that irritate your lungs.  A viral or bacterial respiratory infection.  Allergies.  Asthma.  Postnasal drip.  Smoking.  Acid backing up from the stomach into the  esophagus (gastroesophageal reflux).  Certain medicines.  Chronic lung problems, including COPD (or rarely, lung cancer).  Other medical conditions such as heart failure.  Follow these instructions at home: Pay attention to any changes in your symptoms. Take these actions to help with your discomfort:  Take medicines only as told by your health care provider. ? If you were prescribed an antibiotic medicine, take it as told by your health care provider. Do not stop taking the antibiotic even if you start to feel better. ? Talk with your health care provider before you take a cough suppressant medicine.  Drink enough fluid to keep your urine clear or pale yellow.  If the air is dry, use a cold steam vaporizer or humidifier in your bedroom or your home to help loosen secretions.  Avoid anything that causes you to cough at work or at home.  If your cough is worse at night, try sleeping in a semi-upright position.  Avoid cigarette smoke. If you smoke, quit smoking. If you need help quitting, ask your health care provider.  Avoid caffeine.  Avoid alcohol.  Rest as needed.  Contact a health care provider if:  You have new symptoms.  You cough  up pus.  Your cough does not get better after 2-3 weeks, or your cough gets worse.  You cannot control your cough with suppressant medicines and you are losing sleep.  You develop pain that is getting worse or pain that is not controlled with pain medicines.  You have a fever.  You have unexplained weight loss.  You have night sweats. Get help right away if:  You cough up blood.  You have difficulty breathing.  Your heartbeat is very fast. This information is not intended to replace advice given to you by your health care provider. Make sure you discuss any questions you have with your health care provider. Document Released: 11/05/2010 Document Revised: 10/15/2015 Document Reviewed: 07/16/2014 Elsevier Interactive Patient  Education  2017 [employer].    IF you received an x-ray today, you will receive an invoice from [facility]. Please contact [facility] at [phone] with questions or concerns regarding your invoice.   IF you received labwork today, you will receive an invoice from [place]. Please contact [facility] at [phone] with questions or concerns regarding your invoice.   Our billing staff will not be able to assist you with questions regarding bills from these companies.  You will be contacted with the lab results as soon as they are available. The fastest way to get your results is to activate your My Chart account. Instructions are located on the last page of this paperwork. If you have not heard from [place] regarding the results in 2 weeks, please contact this office.

## 2017-01-05 NOTE — Progress Notes (Signed)
Subjective:  By signing my name below, I, Moises Blood, attest that this documentation has been prepared under the direction and in the presence of [name], MD. Electronically Signed: Moises Blood, [place]. 01/05/2017 , 10:02 AM .  Patient was seen in Room 25 .   Patient ID: Kharon Hixon Haver, male    DOB: [birth year], 71 y.o.   MRN: [id number] Chief Complaint  Patient presents with  . Follow-up    Prev blood work   HPI LINSEY HIROTA is a 71 y.o. male Here for follow up. He was last seen on July 12th for fatigue, weight loss, cough.   Right shoulder pain He was previously followed by [facility] for right shoulder pain, last seen about 1.5 years ago. He was suggested to have injections done, but hasn't had any injections done because other people's experiences informed him it was temporary and worsens. He does plenty of exercise that require his shoulder. He has history of arthritis in his neck, but improving.   Bruising / dryer skin He reports noticing his skin bruising more easily in the past 6 months. His skin has also gotten very dry. He's been taking aspirin for 20-30 years now. He denies any blood when brushing his teeth.   Fatigue/weight loss Wt Readings from Last 3 Encounters:  01/05/17 174 lb (78.9 kg)  12/09/16 179 lb (81.2 kg)  12/01/16 178 lb (80.7 kg)   He had reported a 10 lb weight loss in June visit, but only 3 lbs from Nov 2017 in our records. He had a normal sed rate, TSH, CBC, and chest xray without acute abnormalities; referred to GI for colon cancer screening. He mentions having some spells of lightheadedness with fast positional change only.   Hypomagnesia He has history of low magnesium. He is now on magnesium supplement 400mg  BID. Magnesium was 2.2 on July 12th.   Low vitamin D He was previously on high strength dosing. His Vitamin D level was 12.8 last year, and was normal at 42.7 when checked on July 12th. He's still taking 50000 units  Vitamin D.   Potassium  His potassium was 5.5 on BMP a month ago.   CAD He had been seen by cardiologist in the past for CAD. He had CABG done in 2001 by Dr. Prescott Gum. His last stress test was done in 2013, which was negative for ischemia. Discussed last visit with Dr. Debara Pickett at July visit, that he had not seen him for a few years. He denied chest pains, angina, or dyspnea. I did recommend cardiology follow up, and advised to let me know of a name for referral as he wanted to follow up with a different cardiologist.   History of chronic kidney disease His nephrologist is Dr. Hollie Salk. Stage G3A, with HTN and nephropathy.   Lab Results  Component Value Date   CREATININE 2.00 (H) 12/09/2016   His creatinine had gone up to 2.43 in June down to 2.0 on lab visit on July 20th. His previous range is from 1.6 to 1.8. Recommended to follow up with his nephrologist.   He saw Dr. Hollie Salk a week ago, and was informed his creatinine was doing well.   Chronic Cough He has history of chronic cough for 20 years with history provided on July visit, worsened in the last 6 months. He's had left upper lobectomy due to cyst. His flonase was refilled last visit and cough apparently improved with flonase.   He states his cough has improved,  but still persistent. He denies dyspnea.   GERD He takes Pepcid BID for his heartburn. He previously took Prilosec for 20 years. He's been having a little upset stomach with constipation after eating [employer] chicken about 6 days ago. He denies any nausea, or vomiting.   Patient Active Problem List   Diagnosis Date Noted  . Microalbuminuria 10/16/2015  . Facial numbness   . Hypomagnesemia   . Hypocalcemia 10/08/2015  . Psoriasis 11/21/2013  . S/P CABG x 4 03/14/2013  . Multiple thoracic/lumbar transverse process fractures 12/15/2012  . Fall from roof 12/15/2012  . CAD (coronary artery disease) 12/15/2012  . GERD (gastroesophageal reflux disease) 12/15/2012  .  HTN (hypertension) 12/15/2012  . Hyperlipidemia 12/15/2012  . Multiple bilateral rib fractures    Past Medical History:  Diagnosis Date  . Broken back   . CAD (coronary artery disease)    PCI & CABG  . CHF (congestive heart failure) ([place])   . Diverticulitis    mild - approx 2004  . Family history of heart disease   . GERD (gastroesophageal reflux disease)   . History of nuclear stress test 06/30/2011   lexiscan; normal pattern of perfusion post-stress; low risk scan   . Hyperlipidemia   . Hypertension   . IgA nephropathy   . Irregular heart beat   . Systemic hypertension    Past Surgical History:  Procedure Laterality Date  . CARDIAC CATHETERIZATION  08/1995   PTCA of OM (Dr. Marella Chimes)  . CORONARY ARTERY BYPASS GRAFT  01/2000   x5 - LIMA to LAD, SVG to diagonal, sequential SVG to ramus & OM, SVG to PDA (Dr. Tharon Aquas Trigt)  . LUNG LOBECTOMY     left upper lobe  . TRANSTHORACIC ECHOCARDIOGRAM  12/24/2012   EF 55-60%, mild LVH, mild conc hypertrophy; mild MR; LA mildly dilated   No Known Allergies Prior to Admission medications   Medication Sig Start Date End Date Taking? Authorizing Provider  amLODipine (NORVASC) 5 MG tablet TAKE ONE TABLET BY MOUTH ONE TIME DAILY 01/12/15   Pixie Casino, MD  aspirin 325 MG tablet Take 325 mg by mouth daily.    [provider]  atenolol (TENORMIN) 50 MG tablet TAKE ONE TABLET BY MOUTH ONE TIME DAILY 04/10/15   Hilty, Nadean Corwin, MD  calcium carbonate (TUMS - DOSED IN MG ELEMENTAL CALCIUM) 500 MG chewable tablet Chew 1 tablet (200 mg of elemental calcium total) by mouth 3 (three) times daily with meals. 10/09/15   Domenic Polite, MD  Cholecalciferol 50000 units capsule Take 1 capsule (50,000 Units total) by mouth once a week. 12/01/16   [name], MD  clotrimazole-betamethasone (LOTRISONE) cream Apply 1 application topically 2 (two) times daily. 11/10/16   [name], MD  famotidine (PEPCID) 20 MG tablet Take 1 tablet  (20 mg total) by mouth 2 (two) times daily. 01/21/16   Wardell Honour, MD  fluticasone Asencion Islam) 50 MCG/ACT nasal spray Place 2 sprays into both nostrils daily. 12/01/16   [name], MD  Magnesium Oxide 400 MG CAPS Take 1 capsule (400 mg total) by mouth 2 (two) times daily. 10/09/15   Domenic Polite, MD  Omega-3 Fatty Acids (OMEGA 3 PO) Take 520 mg by mouth 2 (two) times daily.    [provider]  simvastatin (ZOCOR) 80 MG tablet TAKE 1 TABLET BY MOUTH AT BEDTIME. 04/10/15   Hilty, Nadean Corwin, MD  valsartan (DIOVAN) 160 MG tablet TAKE 1 TABLET BY MOUTH  ONE TIME DAILY. 11/17/14   Hilty, Nadean Corwin, MD  vitamin B-12 (CYANOCOBALAMIN) 1000 MCG tablet Take 1,000 mcg by mouth daily.    [provider]   Social History   Social History  . Marital status: Single    Spouse name: n/a  . Number of children: 0  . Years of education: N/A   Occupational History  . Chief Strategy Officer, [employer]    Social History Main Topics  . Smoking status: Never Smoker  . Smokeless tobacco: Never Used  . Alcohol use Yes     Comment: rarely  . Drug use: No  . Sexual activity: Not on file   Other Topics Concern  . Not on file   Social History Narrative   Lives alone.   Sister-in-Law lives next door.   Review of Systems  Constitutional: Negative for fatigue and unexpected weight change.  Eyes: Negative for visual disturbance.  Respiratory: Positive for cough. Negative for chest tightness and shortness of breath.   Cardiovascular: Negative for chest pain, palpitations and leg swelling.  Gastrointestinal: Positive for constipation. Negative for abdominal pain, blood in stool, nausea and vomiting.  Musculoskeletal: Positive for arthralgias.  Neurological: Negative for dizziness, light-headedness and headaches.  Hematological: Bruises/bleeds easily.       Objective:   Physical Exam  Constitutional: He is oriented to person, place, and time. He appears well-developed and  well-nourished.  HENT:  Head: Normocephalic and atraumatic.  Eyes: Pupils are equal, round, and reactive to light. EOM are normal.  Neck: No JVD present. Carotid bruit is not present.  Cardiovascular: Normal rate, regular rhythm and normal heart sounds.   No murmur heard. Pulmonary/Chest: Effort normal and breath sounds normal. He has no rales.  Musculoskeletal: He exhibits no edema.  Neurological: He is alert and oriented to person, place, and time.  Skin: Skin is warm and dry.  Psychiatric: He has a normal mood and affect.  Vitals reviewed.   Vitals:   01/05/17 0915  BP: 138/78  Pulse: 66  Resp: 16  Temp: 98.3 F (36.8 C)  TempSrc: Oral  SpO2: 97%  Weight: 174 lb (78.9 kg)  Height: 5\' 9"  (1.753 m)      Assessment & Plan:   Ivann Trimarco Mroczkowski is a 71 y.o. male Fatigue, unspecified type  - Improving. Reassuring lab work as above. RTC precautions if worsening.  Hypomagnesemia - Plan: Magnesium lab visit in approximately next 6 weeks. Continue supplementation.  Cough - long-standing, likely multifactorial. Common triggers for reflux discussed, continue Flonase. RTC precautions if worsening.  Coronary artery disease involving native heart without angina pectoris, unspecified vessel or lesion type - Plan: Ambulatory referral to Cardiology  - Currently asymptomatic, and fatigue improving as above. We'll refer to cardiology for follow-up.  Low vitamin D level - Plan: VITAMIN D 25 Hydroxy (Vit-D Deficiency, Fractures)  - Recommended 2000 units per day, can stop high intensity dosing, recheck lab only visit 6 weeks.  History of hyperkalemia - Plan: Basic metabolic panel  - Plan on recheck at lab only visit in 6 weeks.  Heartburn  - As above, handout on common triggers. Continue Pepcid for now.  Easy bruising - Plan: CBC  - Most recent CBC including platelets was normal.  CBC at upcoming lab visit, RTC precautions if increasing bruising or other new bleeding.  No orders of the  defined types were placed in this encounter.  Patient Instructions   Cough may be due to multiple causes. Prior xray was overall ok and  can hold on further testing for now. flonase every day, pepcid twice per day, but also try to avoid foods listed below that can worsen heartburn. Return to the clinic or go to the nearest emergency room if any of your symptoms worsen or new symptoms occur.  It may be worth meeting with your orthopaedist to discuss treatment options as an injection or possible physical therapy may be helpful.  Arthritis in neck can also cause some shoulder pain, so if neck is also hurting, an injection or neck treatment may also affect shoulder pain.   You can switch to over the counter vitamin D 2000 units per day instead of 50,000 units per week. Recheck levels in 6 weeks on that dose.   Continue follow up with nephrologist, but I am glad to hear that most recent testing was near your baseline.  I will refer you to cardiology.   If bruising worsens, bleeding with teeth brushing or in urine - return for recheck. I will check platelets on labs in 6 weeks.    Return for lab only visit in 6 weeks.   Return to the clinic or go to the nearest emergency room if any of your symptoms worsen or new symptoms occur.   Food Choices for Gastroesophageal Reflux Disease, Adult When you have gastroesophageal reflux disease (GERD), the foods you eat and your eating habits are very important. Choosing the right foods can help ease the discomfort of GERD. Consider working with a diet and nutrition specialist (dietitian) to help you make healthy food choices. What general guidelines should I follow? Eating plan  Choose healthy foods low in fat, such as fruits, vegetables, whole grains, low-fat dairy products, and lean meat, fish, and poultry.  Eat frequent, small meals instead of three large meals each day. Eat your meals slowly, in a relaxed setting. Avoid bending over or lying down until  2-3 hours after eating.  Limit high-fat foods such as fatty meats or fried foods.  Limit your intake of oils, butter, and shortening to less than 8 teaspoons each day.  Avoid the following: ? Foods that cause symptoms. These may be different for different people. Keep a food diary to keep track of foods that cause symptoms. ? Alcohol. ? Drinking large amounts of liquid with meals. ? Eating meals during the 2-3 hours before bed.  Cook foods using methods other than frying. This may include baking, grilling, or broiling. Lifestyle   Maintain a healthy weight. Ask your health care provider what weight is healthy for you. If you need to lose weight, work with your health care provider to do so safely.  Exercise for at least 30 minutes on 5 or more days each week, or as told by your health care provider.  Avoid wearing clothes that fit tightly around your waist and chest.  Do not use any products that contain nicotine or tobacco, such as cigarettes and e-cigarettes. If you need help quitting, ask your health care provider.  Sleep with the head of your bed raised. Use a wedge under the mattress or blocks under the bed frame to raise the head of the bed. What foods are not recommended? The items listed may not be a complete list. Talk with your dietitian about what dietary choices are best for you. Grains Pastries or quick breads with added fat. [place] toast. Vegetables Deep fried vegetables. [place] fries. Any vegetables prepared with added fat. Any vegetables that cause symptoms. For some people this may include tomatoes and  tomato products, chili peppers, onions and garlic, and horseradish. Fruits Any fruits prepared with added fat. Any fruits that cause symptoms. For some people this may include citrus fruits, such as oranges, grapefruit, pineapple, and lemons. Meats and other protein foods High-fat meats, such as fatty beef or pork, hot dogs, ribs, ham, sausage, salami and bacon. Fried  meat or protein, including fried fish and fried chicken. Nuts and nut butters. Dairy Whole milk and chocolate milk. Sour cream. Cream. Ice cream. Cream cheese. Milk shakes. Beverages Coffee and tea, with or without caffeine. Carbonated beverages. Sodas. Energy drinks. Fruit juice made with acidic fruits (such as orange or grapefruit). Tomato juice. Alcoholic drinks. Fats and oils Butter. Margarine. Shortening. Ghee. Sweets and desserts Chocolate and cocoa. Donuts. Seasoning and other foods Pepper. Peppermint and spearmint. Any condiments, herbs, or seasonings that cause symptoms. For some people, this may include curry, hot sauce, or vinegar-based salad dressings. Summary  When you have gastroesophageal reflux disease (GERD), food and lifestyle choices are very important to help ease the discomfort of GERD.  Eat frequent, small meals instead of three large meals each day. Eat your meals slowly, in a relaxed setting. Avoid bending over or lying down until 2-3 hours after eating.  Limit high-fat foods such as fatty meat or fried foods. This information is not intended to replace advice given to you by your health care provider. Make sure you discuss any questions you have with your health care provider. Document Released: 05/09/2005 Document Revised: 05/10/2016 Document Reviewed: 05/10/2016 Elsevier Interactive Patient Education  2017 Elsevier Inc.  Cough, Adult Coughing is a reflex that clears your throat and your airways. Coughing helps to heal and protect your lungs. It is normal to cough occasionally, but a cough that happens with other symptoms or lasts a long time may be a sign of a condition that needs treatment. A cough may last only 2-3 weeks (acute), or it may last longer than 8 weeks (chronic). What are the causes? Coughing is commonly caused by:  Breathing in substances that irritate your lungs.  A viral or bacterial respiratory infection.  Allergies.  Asthma.  Postnasal  drip.  Smoking.  Acid backing up from the stomach into the esophagus (gastroesophageal reflux).  Certain medicines.  Chronic lung problems, including COPD (or rarely, lung cancer).  Other medical conditions such as heart failure.  Follow these instructions at home: Pay attention to any changes in your symptoms. Take these actions to help with your discomfort:  Take medicines only as told by your health care provider. ? If you were prescribed an antibiotic medicine, take it as told by your health care provider. Do not stop taking the antibiotic even if you start to feel better. ? Talk with your health care provider before you take a cough suppressant medicine.  Drink enough fluid to keep your urine clear or pale yellow.  If the air is dry, use a cold steam vaporizer or humidifier in your bedroom or your home to help loosen secretions.  Avoid anything that causes you to cough at work or at home.  If your cough is worse at night, try sleeping in a semi-upright position.  Avoid cigarette smoke. If you smoke, quit smoking. If you need help quitting, ask your health care provider.  Avoid caffeine.  Avoid alcohol.  Rest as needed.  Contact a health care provider if:  You have new symptoms.  You cough up pus.  Your cough does not get better after 2-3 weeks, or  your cough gets worse.  You cannot control your cough with suppressant medicines and you are losing sleep.  You develop pain that is getting worse or pain that is not controlled with pain medicines.  You have a fever.  You have unexplained weight loss.  You have night sweats. Get help right away if:  You cough up blood.  You have difficulty breathing.  Your heartbeat is very fast. This information is not intended to replace advice given to you by your health care provider. Make sure you discuss any questions you have with your health care provider. Document Released: 11/05/2010 Document Revised: 10/15/2015  Document Reviewed: 07/16/2014 Elsevier Interactive Patient Education  2017 [employer].    IF you received an x-ray today, you will receive an invoice from [facility]. Please contact [facility] at [phone] with questions or concerns regarding your invoice.   IF you received labwork today, you will receive an invoice from [facility]. Please contact [facility] at [phone] with questions or concerns regarding your invoice.   Our billing staff will not be able to assist you with questions regarding bills from these companies.  You will be contacted with the lab results as soon as they are available. The fastest way to get your results is to activate your My Chart account. Instructions are located on the last page of this paperwork. If you have not heard from [place] regarding the results in 2 weeks, please contact this office.       I personally performed the services described in this documentation, which was scribed in my presence. The recorded information has been reviewed and considered for accuracy and completeness, addended by me as needed, and agree with information above.  Signed,   [name], MD [facility].  01/05/17 11:24 PM

## 2017-01-06 ENCOUNTER — Telehealth: Payer: Self-pay | Admitting: Internal Medicine

## 2017-01-06 NOTE — Telephone Encounter (Signed)
Received faxed from Altha Harm, [place] w/[facility] request last EF for patient who self-enrolled in case management program. LM w/last EF from 12/24/2012. Patient has not seen MD since 2015.

## 2017-01-19 ENCOUNTER — Other Ambulatory Visit: Payer: Self-pay | Admitting: Family Medicine

## 2017-01-19 DIAGNOSIS — J309 Allergic rhinitis, unspecified: Secondary | ICD-10-CM

## 2017-01-30 DIAGNOSIS — E7800 Pure hypercholesterolemia, unspecified: Secondary | ICD-10-CM | POA: Diagnosis not present

## 2017-01-30 DIAGNOSIS — I252 Old myocardial infarction: Secondary | ICD-10-CM | POA: Diagnosis not present

## 2017-01-30 DIAGNOSIS — I10 Essential (primary) hypertension: Secondary | ICD-10-CM | POA: Diagnosis not present

## 2017-01-30 DIAGNOSIS — I2510 Atherosclerotic heart disease of native coronary artery without angina pectoris: Secondary | ICD-10-CM | POA: Diagnosis not present

## 2017-03-10 ENCOUNTER — Other Ambulatory Visit: Payer: Self-pay | Admitting: Family Medicine

## 2017-03-10 DIAGNOSIS — R5383 Other fatigue: Secondary | ICD-10-CM

## 2017-03-10 DIAGNOSIS — J309 Allergic rhinitis, unspecified: Secondary | ICD-10-CM

## 2017-03-27 ENCOUNTER — Other Ambulatory Visit: Payer: Self-pay | Admitting: Family Medicine

## 2017-03-27 DIAGNOSIS — R5383 Other fatigue: Secondary | ICD-10-CM

## 2017-04-17 DIAGNOSIS — I129 Hypertensive chronic kidney disease with stage 1 through stage 4 chronic kidney disease, or unspecified chronic kidney disease: Secondary | ICD-10-CM | POA: Diagnosis not present

## 2017-04-17 DIAGNOSIS — N028 Recurrent and persistent hematuria with other morphologic changes: Secondary | ICD-10-CM | POA: Diagnosis not present

## 2017-04-17 DIAGNOSIS — N183 Chronic kidney disease, stage 3 (moderate): Secondary | ICD-10-CM | POA: Diagnosis not present

## 2017-05-20 ENCOUNTER — Other Ambulatory Visit: Payer: Self-pay

## 2017-05-20 DIAGNOSIS — J309 Allergic rhinitis, unspecified: Secondary | ICD-10-CM

## 2017-05-20 MED ORDER — FLUTICASONE PROPIONATE 50 MCG/ACT NA SUSP: NASAL | 2 refills | Status: DC

## 2017-07-07 ENCOUNTER — Encounter: Payer: Self-pay | Admitting: Family Medicine

## 2017-07-07 ENCOUNTER — Ambulatory Visit: Payer: Medicare Other | Admitting: Family Medicine

## 2017-07-07 ENCOUNTER — Ambulatory Visit (INDEPENDENT_AMBULATORY_CARE_PROVIDER_SITE_OTHER): Payer: Medicare Other

## 2017-07-07 VITALS — BP 121/70 | HR 57 | Temp 98.0°F | Resp 18 | Ht 69.0 in | Wt 182.6 lb

## 2017-07-07 DIAGNOSIS — R05 Cough: Secondary | ICD-10-CM

## 2017-07-07 DIAGNOSIS — R059 Cough, unspecified: Secondary | ICD-10-CM

## 2017-07-07 DIAGNOSIS — J22 Unspecified acute lower respiratory infection: Secondary | ICD-10-CM

## 2017-07-07 DIAGNOSIS — R079 Chest pain, unspecified: Secondary | ICD-10-CM

## 2017-07-07 LAB — POCT CBC
Granulocyte percent: 68.4 %G (ref 37–80)
HCT, POC: 37.8 % — AB (ref 43.5–53.7)
Hemoglobin: 12.8 g/dL — AB (ref 14.1–18.1)
Lymph, poc: 2 (ref 0.6–3.4)
MCH, POC: 30.5 pg (ref 27–31.2)
MCHC: 33.8 g/dL (ref 31.8–35.4)
MCV: 90.4 fL (ref 80–97)
MID (cbc): 0.4 (ref 0–0.9)
MPV: 7.2 fL (ref 0–99.8)
POC Granulocyte: 5.1 (ref 2–6.9)
POC LYMPH PERCENT: 26.7 %L (ref 10–50)
POC MID %: 4.9 %M (ref 0–12)
Platelet Count, POC: 165 10*3/uL (ref 142–424)
RBC: 4.19 M/uL — AB (ref 4.69–6.13)
RDW, POC: 13.7 %
WBC: 7.4 10*3/uL (ref 4.6–10.2)

## 2017-07-07 IMAGING — DX DG CHEST 2V
2 series · 2 of 2 positions shown · non-contrast
Comparison: Two-view chest x-ray [DATE]

CLINICAL DATA: Cough, long-standing, recent progression. Chest
pain, unspecified type. Cough.

EXAM:
CHEST  2 VIEW

[chest pa]
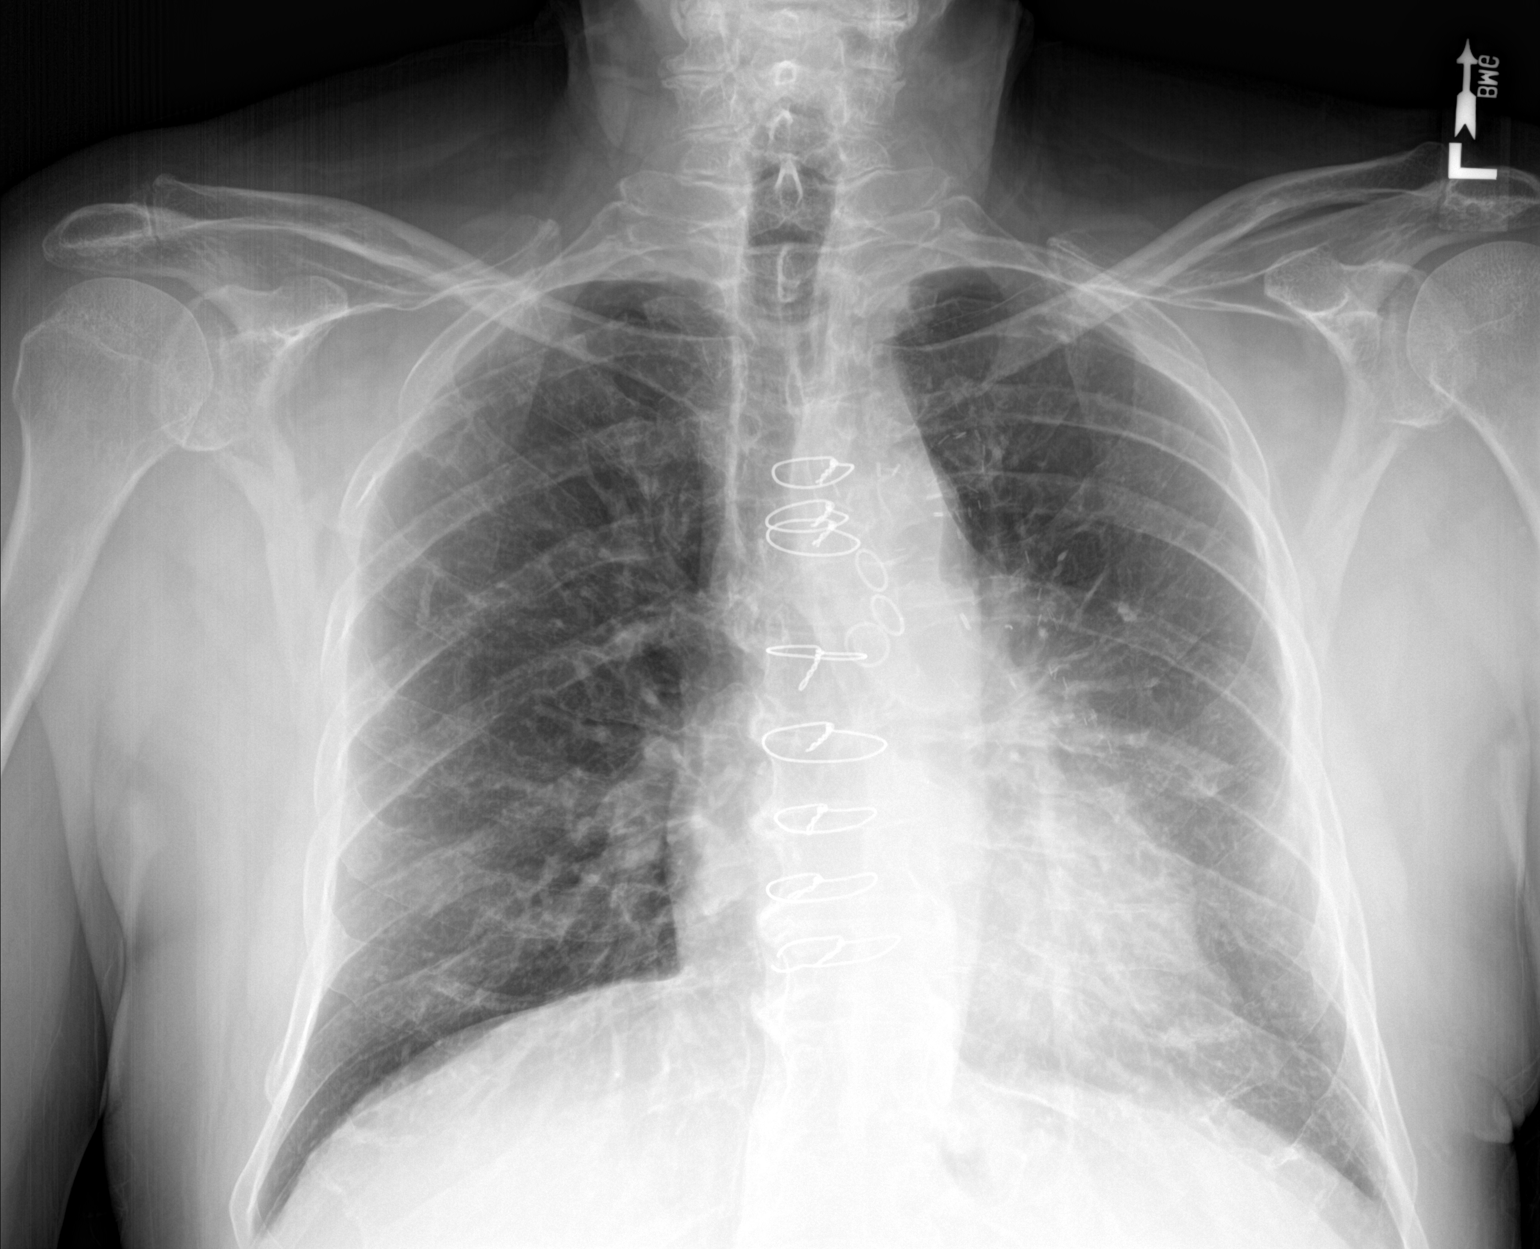

[chest lat]
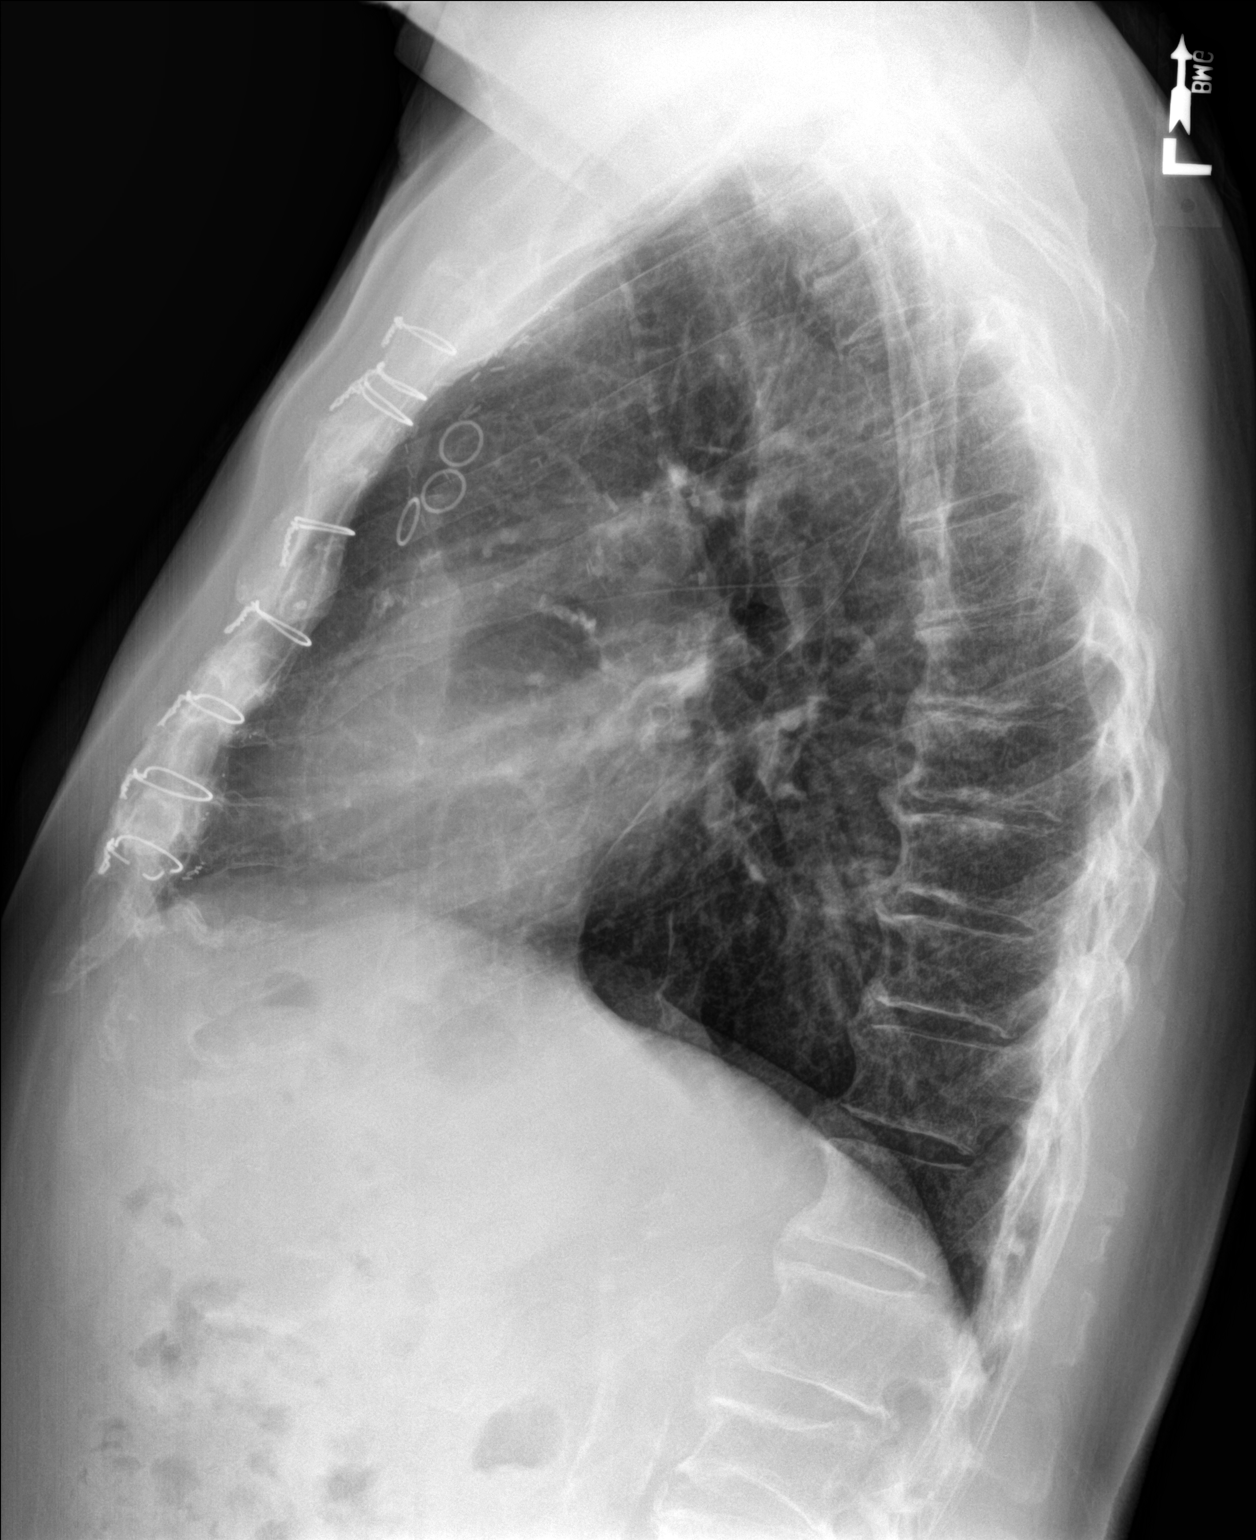

[2 of 2 positions shown; findings below may reference images not displayed]

FINDINGS: The heart size is normal. The lungs are clear. Median sternotomy for
CABG is noted. Dense coronary artery calcifications are present.
Perihilar interstitial coarsening is stable. There is no edema or
effusion. No focal airspace consolidation is present. Mild endplate
degenerative changes are present in the thoracic spine.
IMPRESSION: 1. No acute cardiopulmonary disease.
2. Stable chronic perihilar interstitial coarsening.

## 2017-07-07 MED ORDER — BENZONATATE 100 MG PO CAPS: 100.0000 mg | ORAL_CAPSULE | Freq: Three times a day (TID) | ORAL | 0 refills | Status: DC | PRN

## 2017-07-07 MED ORDER — DOXYCYCLINE HYCLATE 100 MG PO TABS: 100.0000 mg | ORAL_TABLET | Freq: Two times a day (BID) | ORAL | 0 refills | Status: DC

## 2017-07-07 NOTE — Patient Instructions (Addendum)
I do not see any changes on EKG today. Chest discomfort is likely due to cough or muscular chest pain. I expect it to improve once we treat the cough. Tylenol is fine for now for discomfort. However if that is worsening, associated with shortness of breath, radiating to arms or neck, sweating or nausea, proceed to the emergency room or call 911.   For cough, start antibiotic and tessalon for cough. Recheck in the next 10 days if not improving. Sooner if worse.    Cough, Adult Coughing is a reflex that clears your throat and your airways. Coughing helps to heal and protect your lungs. It is normal to cough occasionally, but a cough that happens with other symptoms or lasts a long time may be a sign of a condition that needs treatment. A cough may last only 2-3 weeks (acute), or it may last longer than 8 weeks (chronic). What are the causes? Coughing is commonly caused by:  Breathing in substances that irritate your lungs.  A viral or bacterial respiratory infection.  Allergies.  Asthma.  Postnasal drip.  Smoking.  Acid backing up from the stomach into the esophagus (gastroesophageal reflux).  Certain medicines.  Chronic lung problems, including COPD (or rarely, lung cancer).  Other medical conditions such as heart failure.  Follow these instructions at home: Pay attention to any changes in your symptoms. Take these actions to help with your discomfort:  Take medicines only as told by your health care provider. ? If you were prescribed an antibiotic medicine, take it as told by your health care provider. Do not stop taking the antibiotic even if you start to feel better. ? Talk with your health care provider before you take a cough suppressant medicine.  Drink enough fluid to keep your urine clear or pale yellow.  If the air is dry, use a cold steam vaporizer or humidifier in your bedroom or your home to help loosen secretions.  Avoid anything that causes you to cough at work  or at home.  If your cough is worse at night, try sleeping in a semi-upright position.  Avoid cigarette smoke. If you smoke, quit smoking. If you need help quitting, ask your health care provider.  Avoid caffeine.  Avoid alcohol.  Rest as needed.  Contact a health care provider if:  You have new symptoms.  You cough up pus.  Your cough does not get better after 2-3 weeks, or your cough gets worse.  You cannot control your cough with suppressant medicines and you are losing sleep.  You develop pain that is getting worse or pain that is not controlled with pain medicines.  You have a fever.  You have unexplained weight loss.  You have night sweats. Get help right away if:  You cough up blood.  You have difficulty breathing.  Your heartbeat is very fast. This information is not intended to replace advice given to you by your health care provider. Make sure you discuss any questions you have with your health care provider. Document Released: 11/05/2010 Document Revised: 10/15/2015 Document Reviewed: 07/16/2014 Elsevier Interactive Patient Education  2018 [place].    Chest Wall Pain Chest wall pain is pain in or around the bones and muscles of your chest. Sometimes, an injury causes this pain. Sometimes, the cause may not be known. This pain may take several weeks or longer to get better. Follow these instructions at home: Pay attention to any changes in your symptoms. Take these actions to help with  your pain:  Rest as told by your health care provider.  Avoid activities that cause pain. These include any activities that use your chest muscles or your abdominal and side muscles to lift heavy items.  If directed, apply ice to the painful area: ? Put ice in a plastic bag. ? Place a towel between your skin and the bag. ? Leave the ice on for 20 minutes, 2-3 times per day.  Take over-the-counter and prescription medicines only as told by your health care  provider.  Do not use tobacco products, including cigarettes, chewing tobacco, and e-cigarettes. If you need help quitting, ask your health care provider.  Keep all follow-up visits as told by your health care provider. This is important.  Contact a health care provider if:  You have a fever.  Your chest pain becomes worse.  You have new symptoms. Get help right away if:  You have nausea or vomiting.  You feel sweaty or light-headed.  You have a cough with phlegm (sputum) or you cough up blood.  You develop shortness of breath. This information is not intended to replace advice given to you by your health care provider. Make sure you discuss any questions you have with your health care provider. Document Released: 05/09/2005 Document Revised: 09/17/2015 Document Reviewed: 08/04/2014 Elsevier Interactive Patient Education  2018 [place].  Cough, Adult Coughing is a reflex that clears your throat and your airways. Coughing helps to heal and protect your lungs. It is normal to cough occasionally, but a cough that happens with other symptoms or lasts a long time may be a sign of a condition that needs treatment. A cough may last only 2-3 weeks (acute), or it may last longer than 8 weeks (chronic). What are the causes? Coughing is commonly caused by:  Breathing in substances that irritate your lungs.  A viral or bacterial respiratory infection.  Allergies.  Asthma.  Postnasal drip.  Smoking.  Acid backing up from the stomach into the esophagus (gastroesophageal reflux).  Certain medicines.  Chronic lung problems, including COPD (or rarely, lung cancer).  Other medical conditions such as heart failure.  Follow these instructions at home: Pay attention to any changes in your symptoms. Take these actions to help with your discomfort:  Take medicines only as told by your health care provider. ? If you were prescribed an antibiotic medicine, take it as told by your  health care provider. Do not stop taking the antibiotic even if you start to feel better. ? Talk with your health care provider before you take a cough suppressant medicine.  Drink enough fluid to keep your urine clear or pale yellow.  If the air is dry, use a cold steam vaporizer or humidifier in your bedroom or your home to help loosen secretions.  Avoid anything that causes you to cough at work or at home.  If your cough is worse at night, try sleeping in a semi-upright position.  Avoid cigarette smoke. If you smoke, quit smoking. If you need help quitting, ask your health care provider.  Avoid caffeine.  Avoid alcohol.  Rest as needed.  Contact a health care provider if:  You have new symptoms.  You cough up pus.  Your cough does not get better after 2-3 weeks, or your cough gets worse.  You cannot control your cough with suppressant medicines and you are losing sleep.  You develop pain that is getting worse or pain that is not controlled with pain medicines.  You have  a fever.  You have unexplained weight loss.  You have night sweats. Get help right away if:  You cough up blood.  You have difficulty breathing.  Your heartbeat is very fast. This information is not intended to replace advice given to you by your health care provider. Make sure you discuss any questions you have with your health care provider. Document Released: 11/05/2010 Document Revised: 10/15/2015 Document Reviewed: 07/16/2014 Elsevier Interactive Patient Education  2018 [employer].    IF you received an x-ray today, you will receive an invoice from [facility]. Please contact [facility] at [phone] with questions or concerns regarding your invoice.   IF you received labwork today, you will receive an invoice from [facility]. Please contact [facility] at [phone] with questions or concerns regarding your invoice.   Our billing staff will not be able to assist you  with questions regarding bills from these companies.  You will be contacted with the lab results as soon as they are available. The fastest way to get your results is to activate your My Chart account. Instructions are located on the last page of this paperwork. If you have not heard from [place] regarding the results in 2 weeks, please contact this office.

## 2017-07-07 NOTE — Progress Notes (Signed)
Subjective:  By signing my name below, I, Moises Blood, attest that this documentation has been prepared under the direction and in the presence of [name], MD. Electronically Signed: Moises Blood, [place]. 07/07/2017 , 8:22 AM .  Patient was seen in Room 8 .   Patient ID: Jared White, male    DOB: 1945-06-16, 72 y.o.   MRN: [id number] Chief Complaint  Patient presents with  . Chest Pain    x2 weeks or more; pain is on left side radiates towards back;   . Cough    x2 weeks or more; does not know if chest pain is associated to coughing   HPI Jared White is a 72 y.o. male  Here for cough and left sided chest pain. He has a history of CAD with CABG x4 in 2001.   Patient reports having persistent cough for years, but worsened recently about 3 weeks ago. He's been having productive cough bringing up white mucus with occasional brown mucus in the past few days. He hasn't taken any medications for this. He denies history of emphysema or COPD. He denies history of smoking. He reports having a partial lung lobectomy due to cyst in 1966.   He also reports having left upper chest pain that started about 3 weeks ago. He denies pain radiating into his left arm. He notes he was around someone that was ill about 3 weeks ago. He denies associated nausea, vomiting, or shortness of breath. He mentions hearing a cackle on inhale, and wheeze on exhale. He describes it as dull constant pain, with occasional sharp or burning sensation. He mentions having shortness of breath on exertion ONLY when picking or lifting heavy objects, but not with walking or regular activities.   Patient Active Problem List   Diagnosis Date Noted  . Microalbuminuria 10/16/2015  . Facial numbness   . Hypomagnesemia   . Hypocalcemia 10/08/2015  . Psoriasis 11/21/2013  . S/P CABG x 4 03/14/2013  . Multiple thoracic/lumbar transverse process fractures 12/15/2012  . Fall from roof 12/15/2012  . CAD (coronary artery  disease) 12/15/2012  . GERD (gastroesophageal reflux disease) 12/15/2012  . HTN (hypertension) 12/15/2012  . Hyperlipidemia 12/15/2012  . Multiple bilateral rib fractures    Past Medical History:  Diagnosis Date  . Broken back   . CAD (coronary artery disease)    PCI & CABG  . CHF (congestive heart failure) ([place])   . Diverticulitis    mild - approx 2004  . Family history of heart disease   . GERD (gastroesophageal reflux disease)   . History of nuclear stress test 06/30/2011   lexiscan; normal pattern of perfusion post-stress; low risk scan   . Hyperlipidemia   . Hypertension   . IgA nephropathy   . Irregular heart beat   . Systemic hypertension    Past Surgical History:  Procedure Laterality Date  . CARDIAC CATHETERIZATION  08/1995   PTCA of OM (Dr. Marella Chimes)  . CORONARY ARTERY BYPASS GRAFT  01/2000   x5 - LIMA to LAD, SVG to diagonal, sequential SVG to ramus & OM, SVG to PDA (Dr. Tharon Aquas Trigt)  . LUNG LOBECTOMY     left upper lobe  . TRANSTHORACIC ECHOCARDIOGRAM  12/24/2012   EF 55-60%, mild LVH, mild conc hypertrophy; mild MR; LA mildly dilated   No Known Allergies Prior to Admission medications   Medication Sig Start Date End Date Taking? Authorizing Provider  amLODipine (NORVASC) 5 MG tablet TAKE ONE TABLET  BY MOUTH ONE TIME DAILY 01/12/15   Pixie Casino, MD  aspirin 325 MG tablet Take 325 mg by mouth daily.    [provider]  atenolol (TENORMIN) 50 MG tablet TAKE ONE TABLET BY MOUTH ONE TIME DAILY 04/10/15   Hilty, Nadean Corwin, MD  calcium carbonate (TUMS - DOSED IN MG ELEMENTAL CALCIUM) 500 MG chewable tablet Chew 1 tablet (200 mg of elemental calcium total) by mouth 3 (three) times daily with meals. 10/09/15   Domenic Polite, MD  Cholecalciferol 50000 units capsule Take 1 capsule (50,000 Units total) by mouth once a week. 12/01/16   [name], MD  clotrimazole-betamethasone (LOTRISONE) cream Apply 1 application topically 2 (two) times daily.  11/10/16   [name], MD  famotidine (PEPCID) 20 MG tablet Take 1 tablet (20 mg total) by mouth 2 (two) times daily. 01/21/16   Wardell Honour, MD  fluticasone [facility]) 50 MCG/ACT nasal spray INSTILL 2 SPRAYS INTO EACH NOSTRIL EVERY DAY 05/20/17   [name], MD  Magnesium Oxide 400 MG CAPS Take 1 capsule (400 mg total) by mouth 2 (two) times daily. 10/09/15   Domenic Polite, MD  Omega-3 Fatty Acids (OMEGA 3 PO) Take 520 mg by mouth 2 (two) times daily.    [provider]  simvastatin (ZOCOR) 80 MG tablet TAKE 1 TABLET BY MOUTH AT BEDTIME. 04/10/15   Hilty, Nadean Corwin, MD  valsartan (DIOVAN) 160 MG tablet TAKE 1 TABLET BY MOUTH ONE TIME DAILY. 11/17/14   Hilty, Nadean Corwin, MD  vitamin B-12 (CYANOCOBALAMIN) 1000 MCG tablet Take 1,000 mcg by mouth daily.    [provider]   Social History   Socioeconomic History  . Marital status: Single    Spouse name: n/a  . Number of children: 0  . Years of education: Not on file  . Highest education level: Not on file  Social Needs  . Financial resource strain: Not on file  . Food insecurity - worry: Not on file  . Food insecurity - inability: Not on file  . Transportation needs - medical: Not on file  . Transportation needs - non-medical: Not on file  Occupational History  . Occupation: Chief Strategy Officer, [employer]  Tobacco Use  . Smoking status: Never Smoker  . Smokeless tobacco: Never Used  Substance and Sexual Activity  . Alcohol use: Yes    Comment: rarely  . Drug use: No  . Sexual activity: Not on file  Other Topics Concern  . Not on file  Social History Narrative   Lives alone.   Sister-in-Law lives next door.   Review of Systems  Constitutional: Negative for chills, fatigue, fever and unexpected weight change.  Eyes: Negative for visual disturbance.  Respiratory: Positive for cough. Negative for chest tightness and shortness of breath.   Cardiovascular: Positive for chest pain. Negative for  palpitations and leg swelling.  Gastrointestinal: Negative for abdominal pain, blood in stool, nausea and vomiting.  Neurological: Negative for dizziness, light-headedness and headaches.        Objective:   Physical Exam  Constitutional: He is oriented to person, place, and time. He appears well-developed and well-nourished.  HENT:  Head: Normocephalic and atraumatic.  Right Ear: Tympanic membrane, external ear and ear canal normal.  Left Ear: Tympanic membrane, external ear and ear canal normal.  Nose: No rhinorrhea.  Mouth/Throat: Oropharynx is clear and moist and mucous membranes are normal. No oropharyngeal exudate or posterior oropharyngeal erythema.  Eyes: Conjunctivae and EOM are normal.  Pupils are equal, round, and reactive to light.  Neck: Neck supple. No JVD present. Carotid bruit is not present.  Cardiovascular: Normal rate, regular rhythm, normal heart sounds and intact distal pulses.  No murmur heard. Pulmonary/Chest: Effort normal and breath sounds normal. He has no wheezes. He has no rhonchi. He has no rales.  Slight tenderness over left chest wall, some reproducible chest pain  Abdominal: Soft. There is no tenderness.  Musculoskeletal: He exhibits no edema.  Negative homans', no calf swelling  Lymphadenopathy:    He has no cervical adenopathy.  Neurological: He is alert and oriented to person, place, and time.  Skin: Skin is warm and dry. No rash noted.  Psychiatric: He has a normal mood and affect. His behavior is normal.  Vitals reviewed.   Vitals:   07/07/17 0811  BP: 121/70  Pulse: (!) 57  Resp: 18  Temp: 98 F (36.7 C)  TempSrc: Oral  SpO2: 98%  Weight: 182 lb 9.6 oz (82.8 kg)  Height: 5\' 9"  (1.753 m)   EKG: sinus rhythm rate 56, no acute findings or significant change from 2017.   Dg Chest 2 View  Result Date: 07/07/2017 CLINICAL DATA:  Cough, long-standing, recent progression. Chest pain, unspecified type. Cough. EXAM: CHEST  2 VIEW COMPARISON:   Two-view chest x-ray 11/10/2016 FINDINGS: The heart size is normal. The lungs are clear. Median sternotomy for CABG is noted. Dense coronary artery calcifications are present. Perihilar interstitial coarsening is stable. There is no edema or effusion. No focal airspace consolidation is present. Mild endplate degenerative changes are present in the thoracic spine. IMPRESSION: 1. No acute cardiopulmonary disease. 2. Stable chronic perihilar interstitial coarsening. Electronically Signed   By: San Morelle M.D.   On: 07/07/2017 08:41       Assessment & Plan:     Jared White is a 72 y.o. male Chest pain, unspecified type - Plan: EKG 12-Lead, DG Chest 2 View  Cough - Plan: POCT CBC, DG Chest 2 View, doxycycline (VIBRA-TABS) 100 MG tablet, benzonatate (TESSALON) 100 MG capsule  LRTI (lower respiratory tract infection) - Plan: doxycycline (VIBRA-TABS) 100 MG tablet  Persistent cough with recent worsening, suspected chest wall pain with reproduction on exam and persistent cough. EKG reassuring, chest x-ray without acute infiltrate, reassuring CBC. Possible bronchitis with recent change in mucus production.  - Start doxycycline, potential side effects discussed, Tessalon Per[place] as needed for cough. If unable to be covered, Mucinex DM as another option. Tylenol if needed for chest wall pain, but if any worsening chest pain, or not improving with treatment of cough, ER/cardiology follow up  precautions were discussed. Handout given.   Meds ordered this encounter  Medications  . doxycycline (VIBRA-TABS) 100 MG tablet    Sig: Take 1 tablet (100 mg total) by mouth 2 (two) times daily.    Dispense:  20 tablet    Refill:  0  . benzonatate (TESSALON) 100 MG capsule    Sig: Take 1 capsule (100 mg total) by mouth 3 (three) times daily as needed for cough.    Dispense:  20 capsule    Refill:  0   Patient Instructions    I do not see any changes on EKG today. Chest discomfort is likely due to  cough or muscular chest pain. I expect it to improve once we treat the cough. Tylenol is fine for now for discomfort. However if that is worsening, associated with shortness of breath, radiating to arms or neck, sweating or  nausea, proceed to the emergency room or call 911.   For cough, start antibiotic and tessalon for cough. Recheck in the next 10 days if not improving. Sooner if worse.    Cough, Adult Coughing is a reflex that clears your throat and your airways. Coughing helps to heal and protect your lungs. It is normal to cough occasionally, but a cough that happens with other symptoms or lasts a long time may be a sign of a condition that needs treatment. A cough may last only 2-3 weeks (acute), or it may last longer than 8 weeks (chronic). What are the causes? Coughing is commonly caused by:  Breathing in substances that irritate your lungs.  A viral or bacterial respiratory infection.  Allergies.  Asthma.  Postnasal drip.  Smoking.  Acid backing up from the stomach into the esophagus (gastroesophageal reflux).  Certain medicines.  Chronic lung problems, including COPD (or rarely, lung cancer).  Other medical conditions such as heart failure.  Follow these instructions at home: Pay attention to any changes in your symptoms. Take these actions to help with your discomfort:  Take medicines only as told by your health care provider. ? If you were prescribed an antibiotic medicine, take it as told by your health care provider. Do not stop taking the antibiotic even if you start to feel better. ? Talk with your health care provider before you take a cough suppressant medicine.  Drink enough fluid to keep your urine clear or pale yellow.  If the air is dry, use a cold steam vaporizer or humidifier in your bedroom or your home to help loosen secretions.  Avoid anything that causes you to cough at work or at home.  If your cough is worse at night, try sleeping in a  semi-upright position.  Avoid cigarette smoke. If you smoke, quit smoking. If you need help quitting, ask your health care provider.  Avoid caffeine.  Avoid alcohol.  Rest as needed.  Contact a health care provider if:  You have new symptoms.  You cough up pus.  Your cough does not get better after 2-3 weeks, or your cough gets worse.  You cannot control your cough with suppressant medicines and you are losing sleep.  You develop pain that is getting worse or pain that is not controlled with pain medicines.  You have a fever.  You have unexplained weight loss.  You have night sweats. Get help right away if:  You cough up blood.  You have difficulty breathing.  Your heartbeat is very fast. This information is not intended to replace advice given to you by your health care provider. Make sure you discuss any questions you have with your health care provider. Document Released: 11/05/2010 Document Revised: 10/15/2015 Document Reviewed: 07/16/2014 Elsevier Interactive Patient Education  2018 [place].    Chest Wall Pain Chest wall pain is pain in or around the bones and muscles of your chest. Sometimes, an injury causes this pain. Sometimes, the cause may not be known. This pain may take several weeks or longer to get better. Follow these instructions at home: Pay attention to any changes in your symptoms. Take these actions to help with your pain:  Rest as told by your health care provider.  Avoid activities that cause pain. These include any activities that use your chest muscles or your abdominal and side muscles to lift heavy items.  If directed, apply ice to the painful area: ? Put ice in a plastic bag. ? Place  a towel between your skin and the bag. ? Leave the ice on for 20 minutes, 2-3 times per day.  Take over-the-counter and prescription medicines only as told by your health care provider.  Do not use tobacco products, including cigarettes, chewing  tobacco, and e-cigarettes. If you need help quitting, ask your health care provider.  Keep all follow-up visits as told by your health care provider. This is important.  Contact a health care provider if:  You have a fever.  Your chest pain becomes worse.  You have new symptoms. Get help right away if:  You have nausea or vomiting.  You feel sweaty or light-headed.  You have a cough with phlegm (sputum) or you cough up blood.  You develop shortness of breath. This information is not intended to replace advice given to you by your health care provider. Make sure you discuss any questions you have with your health care provider. Document Released: 05/09/2005 Document Revised: 09/17/2015 Document Reviewed: 08/04/2014 Elsevier Interactive Patient Education  2018 [place].  Cough, Adult Coughing is a reflex that clears your throat and your airways. Coughing helps to heal and protect your lungs. It is normal to cough occasionally, but a cough that happens with other symptoms or lasts a long time may be a sign of a condition that needs treatment. A cough may last only 2-3 weeks (acute), or it may last longer than 8 weeks (chronic). What are the causes? Coughing is commonly caused by:  Breathing in substances that irritate your lungs.  A viral or bacterial respiratory infection.  Allergies.  Asthma.  Postnasal drip.  Smoking.  Acid backing up from the stomach into the esophagus (gastroesophageal reflux).  Certain medicines.  Chronic lung problems, including COPD (or rarely, lung cancer).  Other medical conditions such as heart failure.  Follow these instructions at home: Pay attention to any changes in your symptoms. Take these actions to help with your discomfort:  Take medicines only as told by your health care provider. ? If you were prescribed an antibiotic medicine, take it as told by your health care provider. Do not stop taking the antibiotic even if you start  to feel better. ? Talk with your health care provider before you take a cough suppressant medicine.  Drink enough fluid to keep your urine clear or pale yellow.  If the air is dry, use a cold steam vaporizer or humidifier in your bedroom or your home to help loosen secretions.  Avoid anything that causes you to cough at work or at home.  If your cough is worse at night, try sleeping in a semi-upright position.  Avoid cigarette smoke. If you smoke, quit smoking. If you need help quitting, ask your health care provider.  Avoid caffeine.  Avoid alcohol.  Rest as needed.  Contact a health care provider if:  You have new symptoms.  You cough up pus.  Your cough does not get better after 2-3 weeks, or your cough gets worse.  You cannot control your cough with suppressant medicines and you are losing sleep.  You develop pain that is getting worse or pain that is not controlled with pain medicines.  You have a fever.  You have unexplained weight loss.  You have night sweats. Get help right away if:  You cough up blood.  You have difficulty breathing.  Your heartbeat is very fast. This information is not intended to replace advice given to you by your health care provider. Make sure you discuss  any questions you have with your health care provider. Document Released: 11/05/2010 Document Revised: 10/15/2015 Document Reviewed: 07/16/2014 Elsevier Interactive Patient Education  2018 [employer].    IF you received an x-ray today, you will receive an invoice from [facility]. Please contact [facility] at [phone] with questions or concerns regarding your invoice.   IF you received labwork today, you will receive an invoice from [place]. Please contact [facility] at [phone] with questions or concerns regarding your invoice.   Our billing staff will not be able to assist you with questions regarding bills from these companies.  You will be  contacted with the lab results as soon as they are available. The fastest way to get your results is to activate your My Chart account. Instructions are located on the last page of this paperwork. If you have not heard from [place] regarding the results in 2 weeks, please contact this office.       I personally performed the services described in this documentation, which was scribed in my presence. The recorded information has been reviewed and considered for accuracy and completeness, addended by me as needed, and agree with information above.  Signed,   [name], MD [facility] [place].  07/07/17 9:26 AM

## 2017-07-13 ENCOUNTER — Other Ambulatory Visit: Payer: Self-pay | Admitting: Family Medicine

## 2017-07-13 DIAGNOSIS — J22 Unspecified acute lower respiratory infection: Secondary | ICD-10-CM

## 2017-07-13 DIAGNOSIS — R059 Cough, unspecified: Secondary | ICD-10-CM

## 2017-07-13 DIAGNOSIS — R05 Cough: Secondary | ICD-10-CM

## 2017-07-14 ENCOUNTER — Ambulatory Visit: Payer: Medicare Other | Admitting: Family Medicine

## 2017-07-14 ENCOUNTER — Encounter: Payer: Self-pay | Admitting: Family Medicine

## 2017-07-14 ENCOUNTER — Other Ambulatory Visit: Payer: Self-pay

## 2017-07-14 VITALS — BP 108/64 | HR 58 | Temp 98.3°F | Resp 16 | Ht 67.0 in | Wt 181.8 lb

## 2017-07-14 DIAGNOSIS — R059 Cough, unspecified: Secondary | ICD-10-CM

## 2017-07-14 DIAGNOSIS — I2510 Atherosclerotic heart disease of native coronary artery without angina pectoris: Secondary | ICD-10-CM

## 2017-07-14 DIAGNOSIS — R079 Chest pain, unspecified: Secondary | ICD-10-CM | POA: Diagnosis not present

## 2017-07-14 DIAGNOSIS — Z951 Presence of aortocoronary bypass graft: Secondary | ICD-10-CM | POA: Diagnosis not present

## 2017-07-14 DIAGNOSIS — R05 Cough: Secondary | ICD-10-CM

## 2017-07-14 DIAGNOSIS — J22 Unspecified acute lower respiratory infection: Secondary | ICD-10-CM | POA: Diagnosis not present

## 2017-07-14 DIAGNOSIS — R0789 Other chest pain: Secondary | ICD-10-CM | POA: Diagnosis not present

## 2017-07-14 MED ORDER — DOXYCYCLINE HYCLATE 100 MG PO TABS: 100.0000 mg | ORAL_TABLET | Freq: Two times a day (BID) | ORAL | 0 refills | Status: DC

## 2017-07-14 NOTE — Patient Instructions (Addendum)
Mucinex or Mucinex DM to help produce mucus. Tessalon if needed to suppress cough. I will extend the doxycycline for 3 days, but only take twice per day. I will check a heart enzyme test and inflammation test and let you know if those are concerning, but pain does appear to be from chest wall due to cough at this time, not heart related. If cough is not improving into next week, could consider short course of prednisone as well.  If any worsening chest pain, please proceed to the emergency room as they can do other testing.Return to the clinic or go to the nearest emergency room if any of your symptoms worsen or new symptoms occur.  Cough, Adult Coughing is a reflex that clears your throat and your airways. Coughing helps to heal and protect your lungs. It is normal to cough occasionally, but a cough that happens with other symptoms or lasts a long time may be a sign of a condition that needs treatment. A cough may last only 2-3 weeks (acute), or it may last longer than 8 weeks (chronic). What are the causes? Coughing is commonly caused by:  Breathing in substances that irritate your lungs.  A viral or bacterial respiratory infection.  Allergies.  Asthma.  Postnasal drip.  Smoking.  Acid backing up from the stomach into the esophagus (gastroesophageal reflux).  Certain medicines.  Chronic lung problems, including COPD (or rarely, lung cancer).  Other medical conditions such as heart failure.  Follow these instructions at home: Pay attention to any changes in your symptoms. Take these actions to help with your discomfort:  Take medicines only as told by your health care provider. ? If you were prescribed an antibiotic medicine, take it as told by your health care provider. Do not stop taking the antibiotic even if you start to feel better. ? Talk with your health care provider before you take a cough suppressant medicine.  Drink enough fluid to keep your urine clear or pale  yellow.  If the air is dry, use a cold steam vaporizer or humidifier in your bedroom or your home to help loosen secretions.  Avoid anything that causes you to cough at work or at home.  If your cough is worse at night, try sleeping in a semi-upright position.  Avoid cigarette smoke. If you smoke, quit smoking. If you need help quitting, ask your health care provider.  Avoid caffeine.  Avoid alcohol.  Rest as needed.  Contact a health care provider if:  You have new symptoms.  You cough up pus.  Your cough does not get better after 2-3 weeks, or your cough gets worse.  You cannot control your cough with suppressant medicines and you are losing sleep.  You develop pain that is getting worse or pain that is not controlled with pain medicines.  You have a fever.  You have unexplained weight loss.  You have night sweats. Get help right away if:  You cough up blood.  You have difficulty breathing.  Your heartbeat is very fast. This information is not intended to replace advice given to you by your health care provider. Make sure you discuss any questions you have with your health care provider. Document Released: 11/05/2010 Document Revised: 10/15/2015 Document Reviewed: 07/16/2014 Elsevier Interactive Patient Education  2018 [employer].    IF you received an x-ray today, you will receive an invoice from [facility]. Please contact [facility] at [phone] with questions or concerns regarding your invoice.   IF  you received labwork today, you will receive an invoice from [place]. Please contact [facility] at [phone] with questions or concerns regarding your invoice.   Our billing staff will not be able to assist you with questions regarding bills from these companies.  You will be contacted with the lab results as soon as they are available. The fastest way to get your results is to activate your My Chart account. Instructions are located on  the last page of this paperwork. If you have not heard from [place] regarding the results in 2 weeks, please contact this office.

## 2017-07-14 NOTE — Progress Notes (Signed)
By signing my name below, I, Jared White, attest that this documentation has been prepared under the direction and in the presence of Jared White, [name], MD. Electronically Signed: Mayer White, Medical Scribe 07/14/2017 at 3:35 PM. Subjective:    Patient ID: Jared White, male    DOB: 1945-10-16, 72 y.o.   MRN: [id number]  HPI Chief Complaint  Patient presents with  . Cough    white mucus x 3-4 wks, "I have a lung infection, it did clear up, but the medicine ran out, and it came back."  . Medication Refill    Vitamin D   Jared White is a 72 y.o. male with a h/o of CAD and CABG in 2001 who presents to Primary Care at [facility] complaining of cough. He was seen Jul 07 2017 for persistent cough that had worsened 3 weeks prior. White with occasional brown mucus. Some suspected chest wall pain at that time as well, as it was reproducible on exam. Chest Xray with no acute cardiopulmonary disease, no consolidation.  Doxycycline 10 days prescribed for bronchitis. Tessilon pearls or mucinex DM.   Today, he states he has bilateral anterior chest pain that worsened since 5 days. He describes this as a dull ache on his R side, but none on the L. He states he took his abx as prescribed, then his CP worsened, so he increased his own dose to 3 pills/day of his abx. He states after he increased his dose, his pain improved, but his cough did not improve. He states he knew what the pain was related to and that it was not his heart because he denies any other symptoms related to his heart.  He did not go to the emergency department. He has not taken any advil or tylenol for his symptoms. He has taken aspirin for his pain with mild relief.  He has constant, mild L sided chest wall pain. He states it moved upwards on his chest since yesterday. It does not radiate anywhere beyond his chest and is not exacerbated by exertion. He states this is similar to how it was last week. He has a productive (white) cough, which is the  same as a few weeks prior, but the color of his phlegm has changed from brown to white. The tessilon pearls has only provided mild relief. He has not tried mucinex DM.   He goes to [place] [facility]. He denies fevers, SOB, leg swelling, and wheezing at this time. He further denies reflux (but he takes pepcid 20mg  BID).  Medical history reviewed office visit sept 10 2018, cardiology: decreased energy for 6-8 months. No CP at that time. He had an echo in 2014 without significant valve abnormalities, preserved EF. Normal stress test in 2013. Without symptoms at that visit, further stress testing was not order. Planned for 6 month repeat visit, approx. March 2019. EKG sinus bradycardia last visit.   He is also requesting a refill for his Vitamin D supplement.   Patient Active Problem List   Diagnosis Date Noted  . Microalbuminuria 10/16/2015  . Facial numbness   . Hypomagnesemia   . Hypocalcemia 10/08/2015  . Psoriasis 11/21/2013  . S/P CABG x 4 03/14/2013  . Multiple thoracic/lumbar transverse process fractures 12/15/2012  . Fall from roof 12/15/2012  . CAD (coronary artery disease) 12/15/2012  . GERD (gastroesophageal reflux disease) 12/15/2012  . HTN (hypertension) 12/15/2012  . Hyperlipidemia 12/15/2012  . Multiple bilateral rib fractures    Past Medical History:  Diagnosis Date  . Broken back   . CAD (coronary artery disease)    PCI & CABG  . CHF (congestive heart failure) (Jared White)   . Diverticulitis    mild - approx 2004  . Family history of heart disease   . GERD (gastroesophageal reflux disease)   . History of nuclear stress test 06/30/2011   lexiscan; normal pattern of perfusion post-stress; low risk scan   . Hyperlipidemia   . Hypertension   . IgA nephropathy   . Irregular heart beat   . Systemic hypertension    Past Surgical History:  Procedure Laterality Date  . CARDIAC CATHETERIZATION  08/1995   PTCA of OM (Dr. Marella White)  . CORONARY ARTERY BYPASS  GRAFT  01/2000   x5 - LIMA to LAD, SVG to diagonal, sequential SVG to ramus & OM, SVG to PDA (Dr. Tharon Aquas White)  . LUNG LOBECTOMY     left upper lobe  . TRANSTHORACIC ECHOCARDIOGRAM  12/24/2012   EF 55-60%, mild LVH, mild conc hypertrophy; mild MR; LA mildly dilated   No Known Allergies Prior to Admission medications   Medication Sig Start Date End Date Taking? Authorizing Provider  amLODipine (NORVASC) 5 MG tablet TAKE ONE TABLET BY MOUTH ONE TIME DAILY 01/12/15  Yes Jared White, Jared Corwin, MD  aspirin 325 MG tablet Take 325 mg by mouth daily.   Yes [provider]  atenolol (TENORMIN) 50 MG tablet TAKE ONE TABLET BY MOUTH ONE TIME DAILY 04/10/15  Yes Jared White, Jared Corwin, MD  benzonatate (TESSALON) 100 MG capsule Take 1 capsule (100 mg total) by mouth 3 (three) times daily as needed for cough. 07/07/17  Yes Jared Agreste, MD  calcium carbonate (TUMS - DOSED IN MG ELEMENTAL CALCIUM) 500 MG chewable tablet Chew 1 tablet (200 mg of elemental calcium total) by mouth 3 (three) times daily with meals. 10/09/15  Yes Domenic Polite, MD  clotrimazole-betamethasone (LOTRISONE) cream Apply 1 application topically 2 (two) times daily. 11/10/16  Yes Jared Agreste, MD  famotidine (PEPCID) 20 MG tablet Take 1 tablet (20 mg total) by mouth 2 (two) times daily. 01/21/16  Yes Wardell Honour, MD  fluticasone [facility]) 50 MCG/ACT nasal spray INSTILL 2 SPRAYS INTO EACH NOSTRIL EVERY DAY 05/20/17  Yes Jared Agreste, MD  Magnesium Oxide 400 MG CAPS Take 1 capsule (400 mg total) by mouth 2 (two) times daily. 10/09/15  Yes Domenic Polite, MD  simvastatin (ZOCOR) 80 MG tablet TAKE 1 TABLET BY MOUTH AT BEDTIME. 04/10/15  Yes Jared White, Jared Corwin, MD  valsartan (DIOVAN) 160 MG tablet TAKE 1 TABLET BY MOUTH ONE TIME DAILY. 11/17/14  Yes Jared White, Jared Corwin, MD  vitamin B-12 (CYANOCOBALAMIN) 1000 MCG tablet Take 1,000 mcg by mouth daily.   Yes [provider]  Cholecalciferol 50000 units capsule Take 1 capsule  (50,000 Units total) by mouth once a week. Patient not taking: Reported on 07/14/2017 12/01/16   Jared Agreste, MD  Omega-3 Fatty Acids (OMEGA 3 PO) Take 520 mg by mouth 2 (two) times daily.    [provider]   Social History   Socioeconomic History  . Marital status: Single    Spouse name: n/a  . Number of children: 0  . Years of education: Not on file  . Highest education level: Not on file  Social Needs  . Financial resource strain: Not on file  . Food insecurity - worry: Not on file  . Food insecurity - inability: Not on file  .  Transportation needs - medical: Not on file  . Transportation needs - non-medical: Not on file  Occupational History  . Occupation: Chief Strategy Officer, [employer] representative  Tobacco Use  . Smoking status: Never Smoker  . Smokeless tobacco: Never Used  Substance and Sexual Activity  . Alcohol use: Yes    Comment: rarely  . Drug use: No  . Sexual activity: Not on file  Other Topics Concern  . Not on file  Social History Narrative   Lives alone.   Sister-in-Law lives next door.   Vitals:   07/14/17 1525  BP: 108/64  Pulse: (!) 58  Resp: 16  Temp: 98.3 F (36.8 C)  TempSrc: Oral  SpO2: 96%  Weight: 181 lb 12.8 oz (82.5 kg)  Height: 5\' 7"  (1.702 m)    Review of Systems  Constitutional: Negative for fatigue, fever and unexpected weight change.  Eyes: Negative for visual disturbance.  Respiratory: Positive for cough. Negative for chest tightness, shortness of breath and wheezing.   Cardiovascular: Positive for chest pain (chest wall). Negative for palpitations and leg swelling.  Gastrointestinal: Negative for abdominal pain and blood in stool.  Allergic/Immunologic: Positive for food allergies.  Neurological: Negative for dizziness, light-headedness and headaches.       Objective:   Physical Exam  Constitutional: He is oriented to person, place, and time. He appears well-developed and well-nourished.  HENT:  Head: Normocephalic  and atraumatic.  Eyes: EOM are normal. Pupils are equal, round, and reactive to light.  Neck: No JVD present. Carotid bruit is not present.  Cardiovascular: Normal rate, regular rhythm and normal heart sounds.  No murmur heard. Left anterior chest wall tenderness which reproduced his pain.   Pulmonary/Chest: Effort normal and breath sounds normal. He has no rales.  Musculoskeletal: He exhibits no edema.  Neurological: He is alert and oriented to person, place, and time.  Skin: Skin is warm and dry.  Psychiatric: He has a normal mood and affect.  Vitals reviewed.  EKG: Sinus bradycardia, rate 56, no acute findings or significant changes from prior reading     Assessment & Plan:   Yoshua Geisinger Salyers is a 72 y.o. male Chest pain, unspecified type - Plan: CBC, Sedimentation Rate, Troponin I, EKG 12-Lead  Chest wall pain - Plan: CBC  Cough  Hx of CABG - Plan: Troponin I, EKG 12-Lead  Coronary artery disease involving native heart without angina pectoris, unspecified vessel or lesion type - Plan: Troponin I  LRTI (lower respiratory tract infection)  Suspected lower respiratory tract infection with persistent cough. Mucus production has improved/less discolored. Suspect the doxycycline was having some improvement, now with some persistent cough. Chest pain appears to be chest wall pain again as it is reproducible. No concerning findings on EKG, but does have history of CAD/CABG. Does not feel like this is cardiac pain.  - Check troponin, but again unlikely cardiac pain. Suspected chest wall versus costochondritis. If troponin elevated, further evaluation through ER and ER chest pain precautions were discussed  - Check sedimentation rate for pericarditis but unlikely cause given reproducibility  -Extended doxycycline for 3 additional days, stressed importance of twice a day dosing.  - Continue Tessalon as needed, but trial of Mucinex to see if that is more effective for cough production and  suppression.  - If not improving into next week, consider short course of prednisone. ER/RTC precautions given  No orders of the defined types were placed in this encounter.  Patient Instructions   Mucinex or Mucinex DM to  help produce mucus. Tessalon if needed for cough. I will extend the doxycycline for 3 days, but only TWICE per day. I will check a heart enzyme test and inflammation test and let you know if those are concerning, but pain does appear to be from chest wall due to cough at this time.   If any worsening chest pain, please proceed to the emergency room as they can do other testing.Return to the clinic or go to the nearest emergency room if any of your symptoms worsen or new symptoms occur.  Cough, Adult Coughing is a reflex that clears your throat and your airways. Coughing helps to heal and protect your lungs. It is normal to cough occasionally, but a cough that happens with other symptoms or lasts a long time may be a sign of a condition that needs treatment. A cough may last only 2-3 weeks (acute), or it may last longer than 8 weeks (chronic). What are the causes? Coughing is commonly caused by:  Breathing in substances that irritate your lungs.  A viral or bacterial respiratory infection.  Allergies.  Asthma.  Postnasal drip.  Smoking.  Acid backing up from the stomach into the esophagus (gastroesophageal reflux).  Certain medicines.  Chronic lung problems, including COPD (or rarely, lung cancer).  Other medical conditions such as heart failure.  Follow these instructions at home: Pay attention to any changes in your symptoms. Take these actions to help with your discomfort:  Take medicines only as told by your health care provider. ? If you were prescribed an antibiotic medicine, take it as told by your health care provider. Do not stop taking the antibiotic even if you start to feel better. ? Talk with your health care provider before you take a cough  suppressant medicine.  Drink enough fluid to keep your urine clear or pale yellow.  If the air is dry, use a cold steam vaporizer or humidifier in your bedroom or your home to help loosen secretions.  Avoid anything that causes you to cough at work or at home.  If your cough is worse at night, try sleeping in a semi-upright position.  Avoid cigarette smoke. If you smoke, quit smoking. If you need help quitting, ask your health care provider.  Avoid caffeine.  Avoid alcohol.  Rest as needed.  Contact a health care provider if:  You have new symptoms.  You cough up pus.  Your cough does not get better after 2-3 weeks, or your cough gets worse.  You cannot control your cough with suppressant medicines and you are losing sleep.  You develop pain that is getting worse or pain that is not controlled with pain medicines.  You have a fever.  You have unexplained weight loss.  You have night sweats. Get help right away if:  You cough up blood.  You have difficulty breathing.  Your heartbeat is very fast. This information is not intended to replace advice given to you by your health care provider. Make sure you discuss any questions you have with your health care provider. Document Released: 11/05/2010 Document Revised: 10/15/2015 Document Reviewed: 07/16/2014 Elsevier Interactive Patient Education  2018 [employer].    IF you received an x-ray today, you will receive an invoice from [facility]. Please contact [facility] at [phone] with questions or concerns regarding your invoice.   IF you received labwork today, you will receive an invoice from [facility]. Please contact [facility] at [phone] with questions or concerns regarding your invoice.  Our billing staff will not be able to assist you with questions regarding bills from these companies.  You will be contacted with the lab results as soon as they are available. The fastest way to get  your results is to activate your My Chart account. Instructions are located on the last page of this paperwork. If you have not heard from [place] regarding the results in 2 weeks, please contact this office.      I personally performed the services described in this documentation, which was scribed in my presence. The recorded information has been reviewed and considered for accuracy and completeness, addended by me as needed, and agree with information above.  Signed,   [name], MD [facility].  07/14/17 5:06 PM

## 2017-07-14 NOTE — Telephone Encounter (Signed)
Duplicate request

## 2017-07-14 NOTE — Telephone Encounter (Signed)
Phone call to patient to follow up on mychart refill request for doxycycline 100mg  tabs. He states he was dispensed 16 tablets instead of 20. He took his last tablet last night.   He also is not feeling much better - still having cough and chest discomfort. Recommended appointment per last Dr. Carlota Raspberry office visit note. Patient transferred to front desk to schedule.  Provider, are you comfortable sending in #4 doxycycline tablets?

## 2017-07-15 LAB — CBC
Hematocrit: 41 % (ref 37.5–51.0)
Hemoglobin: 13.7 g/dL (ref 13.0–17.7)
MCH: 30.6 pg (ref 26.6–33.0)
MCHC: 33.4 g/dL (ref 31.5–35.7)
MCV: 92 fL (ref 79–97)
Platelets: 169 10*3/uL (ref 150–379)
RBC: 4.47 x10E6/uL (ref 4.14–5.80)
RDW: 14.2 % (ref 12.3–15.4)
WBC: 8.8 10*3/uL (ref 3.4–10.8)

## 2017-07-15 LAB — SEDIMENTATION RATE: Sed Rate: 13 mm/hr (ref 0–30)

## 2017-07-15 LAB — TROPONIN I: Troponin I: <0.01 ng/mL (ref 0.00–0.04)

## 2017-07-15 NOTE — Telephone Encounter (Signed)
See office visit yesterday

## 2017-08-02 DIAGNOSIS — I10 Essential (primary) hypertension: Secondary | ICD-10-CM | POA: Diagnosis not present

## 2017-08-02 DIAGNOSIS — I2510 Atherosclerotic heart disease of native coronary artery without angina pectoris: Secondary | ICD-10-CM | POA: Diagnosis not present

## 2017-08-02 DIAGNOSIS — E7800 Pure hypercholesterolemia, unspecified: Secondary | ICD-10-CM | POA: Diagnosis not present

## 2017-08-02 DIAGNOSIS — I252 Old myocardial infarction: Secondary | ICD-10-CM | POA: Diagnosis not present

## 2017-08-07 DIAGNOSIS — N028 Recurrent and persistent hematuria with other morphologic changes: Secondary | ICD-10-CM | POA: Diagnosis not present

## 2017-08-07 DIAGNOSIS — N183 Chronic kidney disease, stage 3 (moderate): Secondary | ICD-10-CM | POA: Diagnosis not present

## 2017-08-07 DIAGNOSIS — I129 Hypertensive chronic kidney disease with stage 1 through stage 4 chronic kidney disease, or unspecified chronic kidney disease: Secondary | ICD-10-CM | POA: Diagnosis not present

## 2017-10-17 ENCOUNTER — Encounter: Payer: Self-pay | Admitting: Family Medicine

## 2017-11-16 ENCOUNTER — Other Ambulatory Visit: Payer: Self-pay | Admitting: Family Medicine

## 2017-11-16 DIAGNOSIS — B356 Tinea cruris: Secondary | ICD-10-CM

## 2018-02-07 DIAGNOSIS — E7800 Pure hypercholesterolemia, unspecified: Secondary | ICD-10-CM | POA: Diagnosis not present

## 2018-02-07 DIAGNOSIS — I10 Essential (primary) hypertension: Secondary | ICD-10-CM | POA: Diagnosis not present

## 2018-02-07 DIAGNOSIS — I2510 Atherosclerotic heart disease of native coronary artery without angina pectoris: Secondary | ICD-10-CM | POA: Diagnosis not present

## 2018-02-07 DIAGNOSIS — I252 Old myocardial infarction: Secondary | ICD-10-CM | POA: Diagnosis not present

## 2018-03-04 ENCOUNTER — Other Ambulatory Visit: Payer: Self-pay | Admitting: Family Medicine

## 2018-03-04 DIAGNOSIS — J309 Allergic rhinitis, unspecified: Secondary | ICD-10-CM

## 2018-05-01 DIAGNOSIS — N189 Chronic kidney disease, unspecified: Secondary | ICD-10-CM | POA: Diagnosis not present

## 2018-05-01 DIAGNOSIS — I129 Hypertensive chronic kidney disease with stage 1 through stage 4 chronic kidney disease, or unspecified chronic kidney disease: Secondary | ICD-10-CM | POA: Diagnosis not present

## 2018-05-01 DIAGNOSIS — D631 Anemia in chronic kidney disease: Secondary | ICD-10-CM | POA: Diagnosis not present

## 2018-05-01 DIAGNOSIS — R7303 Prediabetes: Secondary | ICD-10-CM | POA: Diagnosis not present

## 2018-05-01 DIAGNOSIS — E785 Hyperlipidemia, unspecified: Secondary | ICD-10-CM | POA: Diagnosis not present

## 2018-05-01 DIAGNOSIS — N183 Chronic kidney disease, stage 3 (moderate): Secondary | ICD-10-CM | POA: Diagnosis not present

## 2018-05-01 DIAGNOSIS — N028 Recurrent and persistent hematuria with other morphologic changes: Secondary | ICD-10-CM | POA: Diagnosis not present

## 2018-07-04 ENCOUNTER — Other Ambulatory Visit: Payer: Self-pay

## 2018-07-04 MED ORDER — VALSARTAN 160 MG PO TABS: ORAL_TABLET | ORAL | 0 refills | Status: DC

## 2018-07-26 DIAGNOSIS — R109 Unspecified abdominal pain: Secondary | ICD-10-CM | POA: Diagnosis not present

## 2018-07-27 ENCOUNTER — Other Ambulatory Visit: Payer: Self-pay

## 2018-07-27 MED ORDER — VALSARTAN 160 MG PO TABS: ORAL_TABLET | ORAL | 2 refills | Status: DC

## 2018-07-30 ENCOUNTER — Other Ambulatory Visit: Payer: Self-pay | Admitting: Nephrology

## 2018-07-30 DIAGNOSIS — R109 Unspecified abdominal pain: Secondary | ICD-10-CM

## 2018-07-31 ENCOUNTER — Ambulatory Visit
Admission: RE | Admit: 2018-07-31 | Discharge: 2018-07-31 | Disposition: A | Discharge status: Home / Self Care | Payer: Medicare Other | Source: Ambulatory Visit | Attending: Nephrology | Admitting: Nephrology

## 2018-07-31 DIAGNOSIS — N200 Calculus of kidney: Secondary | ICD-10-CM | POA: Diagnosis not present

## 2018-07-31 DIAGNOSIS — R109 Unspecified abdominal pain: Secondary | ICD-10-CM

## 2018-07-31 IMAGING — CT CT ABDOMEN AND PELVIS WITHOUT CONTRAST
2 of 4 series · 13 of 46 positions shown, 15 images · non-contrast
Comparison: [DATE]. [DATE].

CLINICAL DATA: Right-sided flank pain.

EXAM:
CT ABDOMEN AND PELVIS WITHOUT CONTRAST
TECHNIQUE: Multidetector CT imaging of the abdomen and pelvis was performed
following the standard protocol without IV contrast.

[Series 2: renal stone 5.00 br40 s3 ax · axial · 0.59mm/px · z∈[+1491,+1836]mm · 10 of 85 slices shown, 12 images]
[im 8/85  soft-tissue]
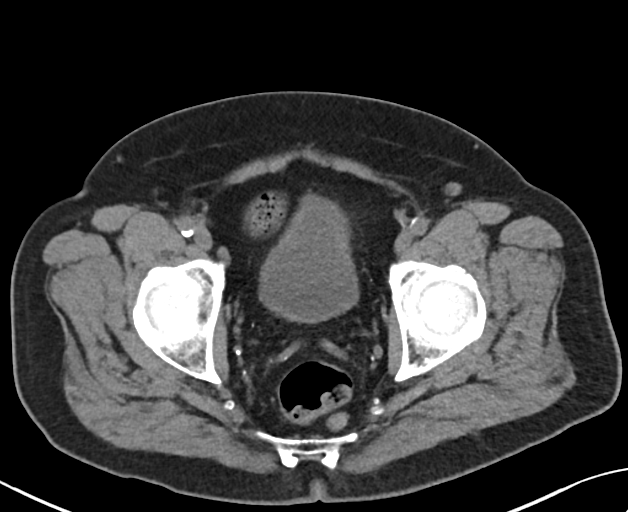
[im 8/85  bone]
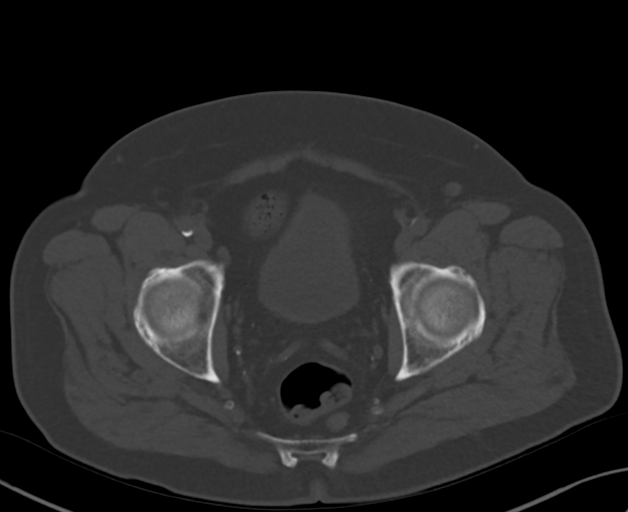
[im 16/85  soft-tissue]
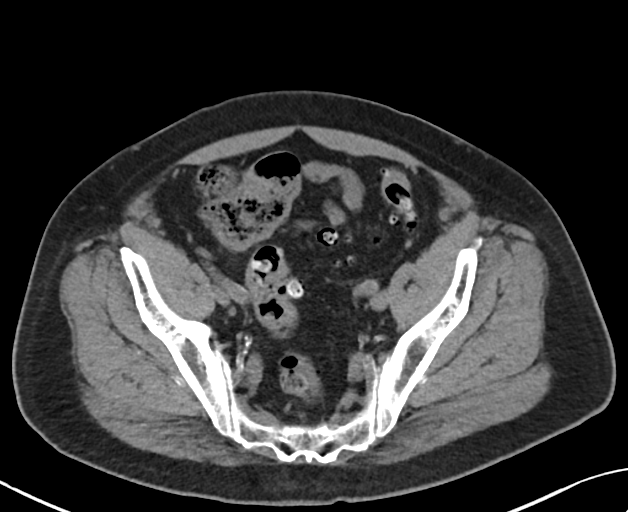
[im 23/85  soft-tissue]
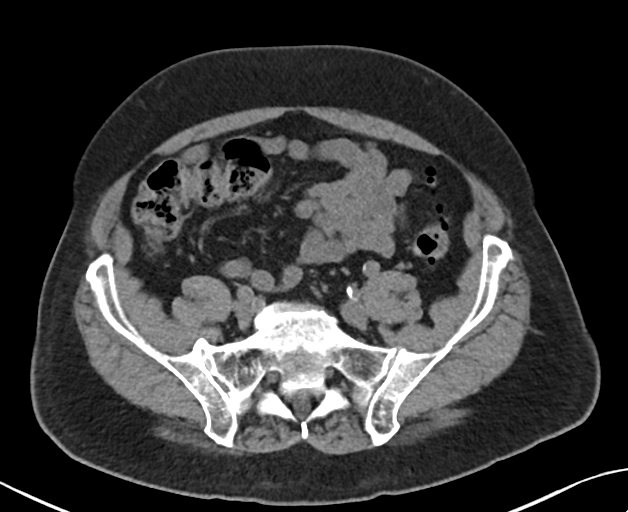
[im 31/85  soft-tissue]
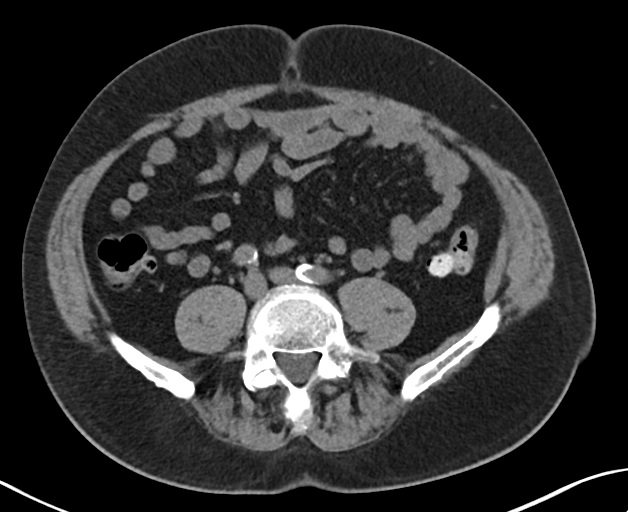
[im 39/85  soft-tissue]
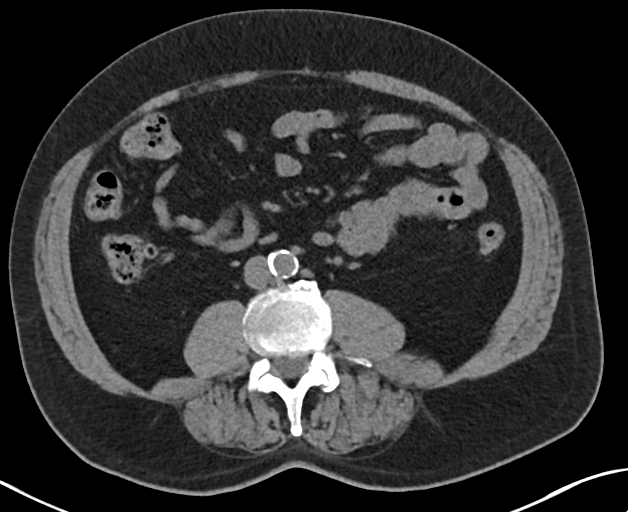
[im 46/85  soft-tissue]
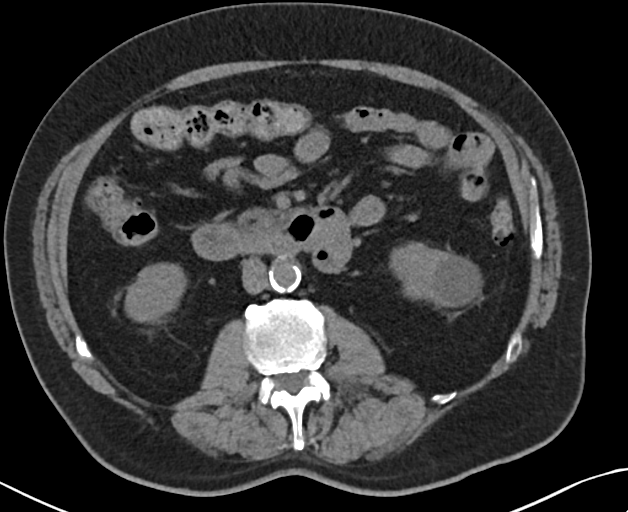
[im 54/85  soft-tissue]
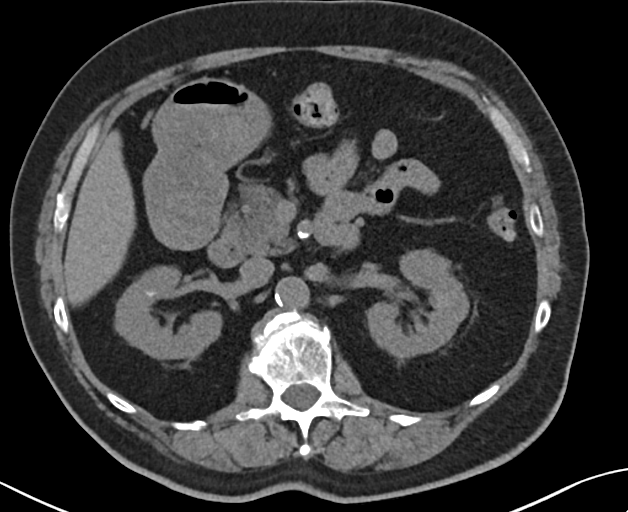
[im 62/85  soft-tissue]
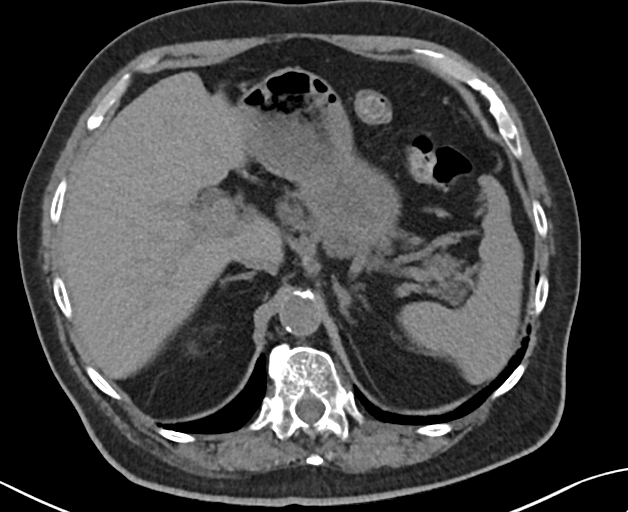
[im 69/85  soft-tissue]
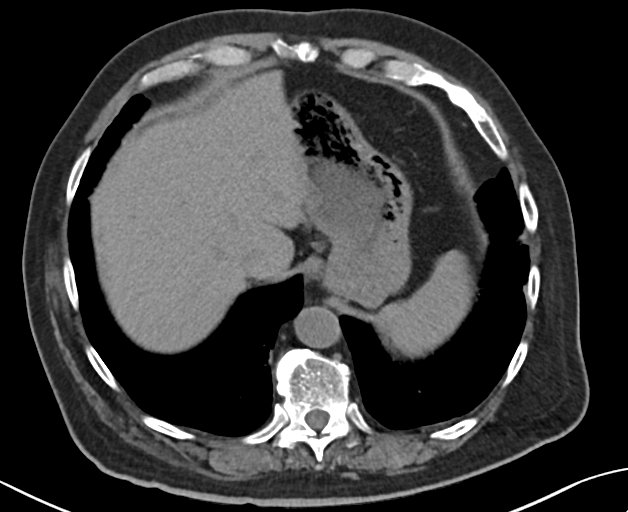
[im 69/85  bone]
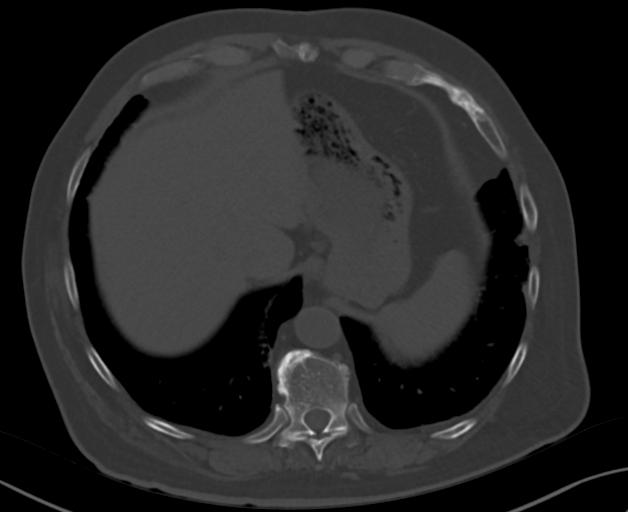
[im 77/85  soft-tissue]
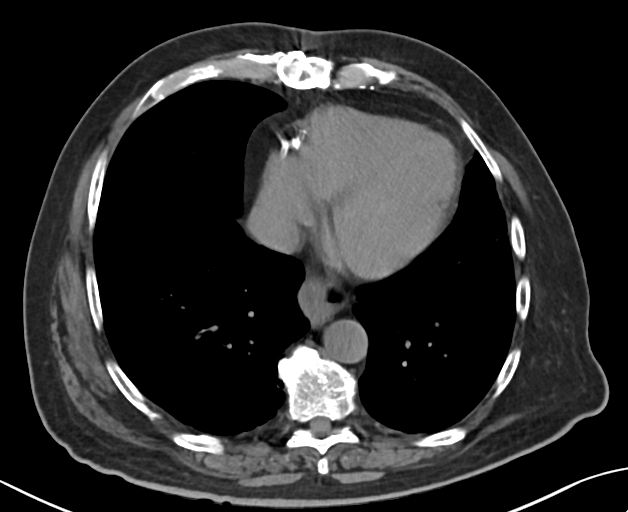

[Series 6: renal stone 2.00 br40 s3 cor · coronal · 0.73mm/px · 3 of 152 slices shown]
[im 51/152  soft-tissue]
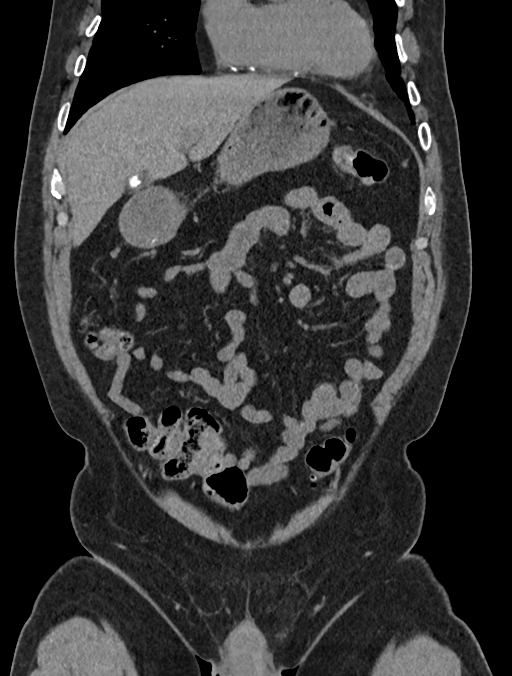
[im 68/152  soft-tissue]
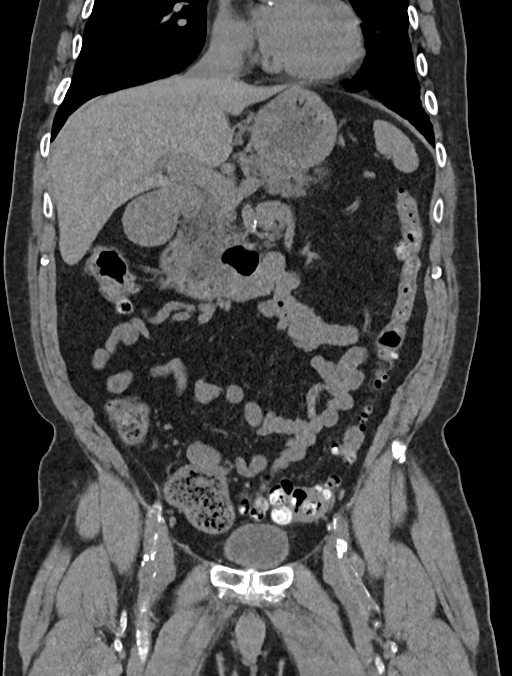
[im 84/152  soft-tissue]
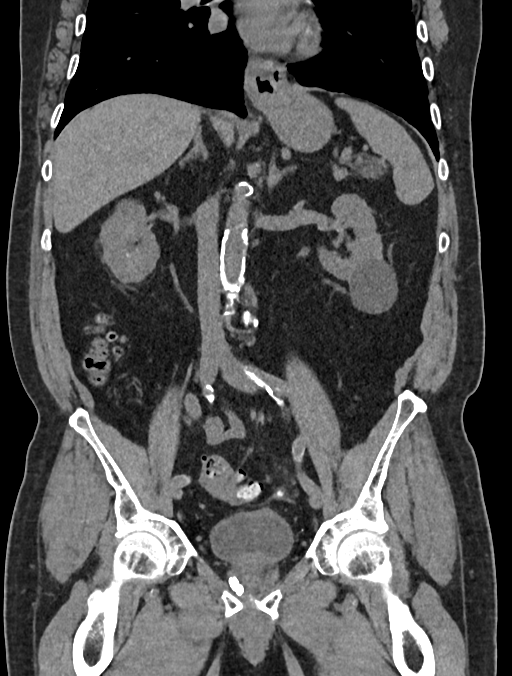

[13 of 46 positions shown; findings below may reference images not displayed]

FINDINGS: Lower chest: 3 mm right lung nodule (image 1/series 4) is
incompletely visualized. Minimal atelectasis or scarring noted left
base.

Hepatobiliary: No focal abnormality in the liver on this study
without intravenous contrast. Tiny layering calcified gallstones
evident. No intrahepatic or extrahepatic biliary dilation.

Pancreas: No focal mass lesion. No dilatation of the main duct. No
intraparenchymal cyst. No peripancreatic edema.

Spleen: No splenomegaly. No focal mass lesion.

Adrenals/Urinary Tract: No adrenal nodule or mass. 1-2 mm
nonobstructing stone identified interpolar right kidney (35/2). 2-3
mm nonobstructing stone is seen in the interpolar left kidney on
37/2. 3.6 cm water density lesion in the lower pole left kidney is
slightly increased in size since [WX] and compatible with cyst. No
ureteral or bladder stones. No secondary changes in either kidney or
ureter.

Stomach/Bowel: Small hiatal hernia. Stomach otherwise unremarkable.
Duodenum is normally positioned as is the ligament of Treitz.
Duodenal diverticulum evident. No small bowel wall thickening. No
small bowel dilatation. The terminal ileum is normal. The appendix
is normal. No gross colonic mass. No colonic wall thickening.
Diverticuli are seen scattered along the entire length of the colon
without CT findings of diverticulitis.

Vascular/Lymphatic: There is abdominal aortic atherosclerosis
without aneurysm. There is no gastrohepatic or hepatoduodenal
ligament lymphadenopathy. No intraperitoneal or retroperitoneal
lymphadenopathy. No pelvic sidewall lymphadenopathy. 9 mm short axis
perirectal/presacral lymph node (78/2) is abnormal and new since
[DATE].

Reproductive: Prostate gland mildly enlarged.

Other: No intraperitoneal free fluid.

Musculoskeletal: No worrisome lytic or sclerotic osseous
abnormality. Stable compression deformity at L2.
IMPRESSION: 1. Bilateral tiny nonobstructing renal stones. No ureteral or
bladder stones. No secondary changes in either kidney or ureter.
2. 9 mm short axis perirectal/presacral lymph node, new since
[DATE]. This is an abnormal finding and correlation with
colorectal cancer screening history recommended. Consider close
follow-up CT in 3 months to ensure stability.
3. 3 mm right lung nodule, incompletely visualized. Consider
dedicated CT chest without contrast for full assessment of this
nodule.
4. Cholelithiasis.
5.  Aortic Atherosclerois ([WX]-170.0)

## 2018-08-01 ENCOUNTER — Other Ambulatory Visit: Payer: Self-pay | Admitting: Nephrology

## 2018-08-07 NOTE — Progress Notes (Deleted)
Patient is here for follow up visit.  Subjective:   _0  ID: Jared White, male    DOB: Mar 05, 1946, 73 y.o.   MRN: [id number]  No chief complaint on file.   *** HPI  73 y.o.***male with ***  *** Past Medical History:  Diagnosis Date  . Broken back   . CAD (coronary artery disease)    PCI & CABG  . CHF (congestive heart failure) ([place])   . Diverticulitis    mild - approx 2004  . Family history of heart disease   . GERD (gastroesophageal reflux disease)   . History of nuclear stress test 06/30/2011   lexiscan; normal pattern of perfusion post-stress; low risk scan   . Hyperlipidemia   . Hypertension   . IgA nephropathy   . Irregular heart beat   . Systemic hypertension     *** Past Surgical History:  Procedure Laterality Date  . CARDIAC CATHETERIZATION  08/1995   PTCA of OM (Dr. Marella Chimes)  . CORONARY ARTERY BYPASS GRAFT  01/2000   x5 - LIMA to LAD, SVG to diagonal, sequential SVG to ramus & OM, SVG to PDA (Dr. Tharon Aquas Trigt)  . LUNG LOBECTOMY     left upper lobe  . TRANSTHORACIC ECHOCARDIOGRAM  12/24/2012   EF 55-60%, mild LVH, mild conc hypertrophy; mild MR; LA mildly dilated    *** Social History   Socioeconomic History  . Marital status: Single    Spouse name: n/a  . Number of children: 0  . Years of education: Not on file  . Highest education level: Not on file  Occupational History  . Occupation: Chief Strategy Officer, Holiday representative  Social Needs  . Financial resource strain: Not on file  . Food insecurity:    Worry: Not on file    Inability: Not on file  . Transportation needs:    Medical: Not on file    Non-medical: Not on file  Tobacco Use  . Smoking status: Never Smoker  . Smokeless tobacco: Never Used  Substance and Sexual Activity  . Alcohol use: Yes    Comment: rarely  . Drug use: No  . Sexual activity: Not on file  Lifestyle  . Physical activity:    Days per week: Not on file    Minutes per session: Not on file  . Stress: Not  on file  Relationships  . Social connections:    Talks on phone: Not on file    Gets together: Not on file    Attends religious service: Not on file    Active member of club or organization: Not on file    Attends meetings of clubs or organizations: Not on file    Relationship status: Not on file  . Intimate partner violence:    Fear of current or ex partner: Not on file    Emotionally abused: Not on file    Physically abused: Not on file    Forced sexual activity: Not on file  Other Topics Concern  . Not on file  Social History Narrative   Lives alone.   Sister-in-Law lives next door.    *** Current Outpatient Medications on File Prior to Visit  Medication Sig Dispense Refill  . amLODipine (NORVASC) 5 MG tablet TAKE ONE TABLET BY MOUTH ONE TIME DAILY 30 tablet 6  . aspirin 325 MG tablet Take 325 mg by mouth daily.    Marland Kitchen atenolol (TENORMIN) 50 MG tablet TAKE ONE TABLET BY MOUTH ONE TIME DAILY 30  tablet 0  . benzonatate (TESSALON) 100 MG capsule Take 1 capsule (100 mg total) by mouth 3 (three) times daily as needed for cough. 20 capsule 0  . calcium carbonate (TUMS - DOSED IN MG ELEMENTAL CALCIUM) 500 MG chewable tablet Chew 1 tablet (200 mg of elemental calcium total) by mouth 3 (three) times daily with meals. 90 tablet 0  . Cholecalciferol 50000 units capsule Take 1 capsule (50,000 Units total) by mouth once a week. (Patient not taking: Reported on 07/14/2017) 4 capsule 2  . clotrimazole-betamethasone (LOTRISONE) cream Apply 1 application topically 2 (two) times daily. 30 g 0  . doxycycline (VIBRA-TABS) 100 MG tablet Take 1 tablet (100 mg total) by mouth 2 (two) times daily. 6 tablet 0  . famotidine (PEPCID) 20 MG tablet Take 1 tablet (20 mg total) by mouth 2 (two) times daily. 60 tablet 5  . fluticasone (FLONASE) 50 MCG/ACT nasal spray INSTILL 2 SPRAYS INTO EACH NOSTRIL EVERY DAY 48 g 2  . Magnesium Oxide 400 MG CAPS Take 1 capsule (400 mg total) by mouth 2 (two) times daily. 60  capsule 0  . Omega-3 Fatty Acids (OMEGA 3 PO) Take 520 mg by mouth 2 (two) times daily.    . simvastatin (ZOCOR) 80 MG tablet TAKE 1 TABLET BY MOUTH AT BEDTIME. 30 tablet 0  . valsartan (DIOVAN) 160 MG tablet TAKE 1 TABLET BY MOUTH ONE TIME DAILY. 90 tablet 2  . vitamin B-12 (CYANOCOBALAMIN) 1000 MCG tablet Take 1,000 mcg by mouth daily.     No current facility-administered medications on file prior to visit.     Cardiovascular studies:  *** EKG 02/07/2018: Sinus rhythm 59 bpm.  Normal axis.  Borderline first-degree AV block.  Otherwise normal conduction.  Old inferior infarct.  Renal Artery Duplex 09/04/2009: Right & Left renal Arteries: No evidence of significant diameter reduction, tortuosity or and other vascular abnormality.  Right & Left Kidneys: Normal in size, symmetrical in shape with normal cortex and medulla and no abnormal echogenicity or hydronephrosis.   Open heart Surgery 2000: CABG    Recent labs:  ***  07/14/2017: H/H 13.7/41.  MCV 92.  Platelets 169. Glucose 119, BUN/creatinine 28/1.81.  EGFR 37.  Sodium 140, potassium 4.7  Lipid Panel     Component Value Date/Time   CHOL 146 10/03/2015 1603   TRIG 162 (H) 10/03/2015 1603   HDL 29 (L) 10/03/2015 1603   CHOLHDL 5.0 10/03/2015 1603   VLDL 32 (H) 10/03/2015 1603   LDLCALC 85 10/03/2015 1603   Lab Results  Component Value Date   TSH 2.120 11/10/2016     Review of Systems  Constitution: Positive for malaise/fatigue. Negative for decreased appetite, weight gain and weight loss.  HENT: Negative for congestion.   Eyes: Negative for visual disturbance.  Cardiovascular: Negative for chest pain, dyspnea on exertion, leg swelling, palpitations and syncope.  Respiratory: Negative for shortness of breath.   Endocrine: Negative for cold intolerance.  Hematologic/Lymphatic: Does not bruise/bleed easily.  Skin: Negative for itching and rash.  Musculoskeletal: Negative for myalgias.  Gastrointestinal: Negative for  abdominal pain, nausea and vomiting.  Genitourinary: Negative for dysuria.  Neurological: Positive for paresthesias (Left temporal region. Sees Neurologist at [facility]). Negative for dizziness and weakness.  Psychiatric/Behavioral: The patient is not nervous/anxious.   All other systems reviewed and are negative.      Objective:   *** There were no vitals filed for this visit.   Physical Exam  Constitutional: He appears well-developed and well-nourished. No  distress.  HENT:  Head: Atraumatic.  Eyes: Conjunctivae are normal.  Neck: Neck supple. No JVD present. No thyromegaly present.  Cardiovascular: Normal rate, regular rhythm, normal heart sounds and intact distal pulses. Exam reveals no gallop.  No murmur heard. Pulmonary/Chest: Effort normal and breath sounds normal.  Abdominal: Soft. Bowel sounds are normal.  Musculoskeletal: Normal range of motion.        General: No edema.  Neurological: He is alert.  Skin: Skin is warm and dry.  Psychiatric: He has a normal mood and affect.        Assessment & Recommendations:   ***  There are no diagnoses linked to this encounter.   Nigel Mormon, MD [facility] Pager: [phone] Office: [phone] If no answer Cell [phone]

## 2018-08-08 ENCOUNTER — Ambulatory Visit: Payer: Self-pay | Admitting: Cardiology

## 2018-09-13 ENCOUNTER — Ambulatory Visit (INDEPENDENT_AMBULATORY_CARE_PROVIDER_SITE_OTHER): Payer: Medicare Other | Admitting: Cardiology

## 2018-09-13 ENCOUNTER — Other Ambulatory Visit: Payer: Self-pay

## 2018-09-13 ENCOUNTER — Encounter: Payer: Self-pay | Admitting: Cardiology

## 2018-09-13 VITALS — BP 126/71 | HR 66 | Ht 68.0 in | Wt 175.0 lb

## 2018-09-13 DIAGNOSIS — N183 Chronic kidney disease, stage 3 unspecified: Secondary | ICD-10-CM

## 2018-09-13 DIAGNOSIS — I129 Hypertensive chronic kidney disease with stage 1 through stage 4 chronic kidney disease, or unspecified chronic kidney disease: Secondary | ICD-10-CM

## 2018-09-13 DIAGNOSIS — I2510 Atherosclerotic heart disease of native coronary artery without angina pectoris: Secondary | ICD-10-CM

## 2018-09-13 DIAGNOSIS — N1832 Chronic kidney disease, stage 3b: Secondary | ICD-10-CM | POA: Insufficient documentation

## 2018-09-13 DIAGNOSIS — I10 Essential (primary) hypertension: Secondary | ICD-10-CM

## 2018-09-13 MED ORDER — ASPIRIN EC 81 MG PO TBEC: 81.0000 mg | DELAYED_RELEASE_TABLET | Freq: Every day | ORAL | 3 refills | Status: DC

## 2018-09-13 NOTE — Progress Notes (Signed)
Telephone visit note  Subjective:   Jared White, male    DOB: 1946-01-05, 73 y.o.   MRN: [id number]   I connected with the patient on 09/13/18 by a telephone call and verified that I am speaking with the correct person using two identifiers.     I offered the patient a video enabled application for a virtual visit. Unfortunately, this could not be accomplished due to technical difficulties/lack of video enabled phone/computer. I discussed the limitations of evaluation and management by telemedicine and the availability of in person appointments. The patient expressed understanding and agreed to proceed.   This visit type was conducted due to national recommendations for restrictions regarding the COVID-19 Pandemic (e.g. social distancing).  This format is felt to be most appropriate for this patient at this time.  All issues noted in this document were discussed and addressed.  No physical exam was performed (except for noted visual exam findings with Tele health visits).  The patient has consented to conduct a Tele health visit and understands insurance will be billed.   Chief complaint:  Coronary artery disease    HPI  73 year old male with coronary artery disease status post CABG 2001 vy Dr. Lucianne Lei Trigt (LIMA-LAD, SVG-Diag, seg SVG-ramus & OM, SVG-PDA), hypertension, CKD III w/IgA nephropathy, hyperlipidemia.  Patient is doing well. He has not had any chest pain, shortness of breath, palpitations, leg edema, orthopnea, PND, TIA/syncope. It appears that he increased Aspirin to 325 mg from 81 mg by himself, as he felt that 81 mg "wasn't doing enough for him". He could not elaborate more on this. He is compliant with rest of his medical therapy. He has had recent follow up with his nephrologist regarding his CKD.  Past Medical History:  Diagnosis Date  . Broken back   . CAD (coronary artery disease)    PCI & CABG  . CHF (congestive heart failure) ([place])   . Diverticulitis    mild -  approx 2004  . Family history of heart disease   . GERD (gastroesophageal reflux disease)   . History of nuclear stress test 06/30/2011   lexiscan; normal pattern of perfusion post-stress; low risk scan   . Hyperlipidemia   . Hypertension   . IgA nephropathy   . Irregular heart beat   . Systemic hypertension      Past Surgical History:  Procedure Laterality Date  . CARDIAC CATHETERIZATION  08/1995   PTCA of OM (Dr. Marella Chimes)  . CORONARY ARTERY BYPASS GRAFT  01/2000   x5 - LIMA to LAD, SVG to diagonal, sequential SVG to ramus & OM, SVG to PDA (Dr. Tharon Aquas Trigt)  . LUNG LOBECTOMY     left upper lobe  . TRANSTHORACIC ECHOCARDIOGRAM  12/24/2012   EF 55-60%, mild LVH, mild conc hypertrophy; mild MR; LA mildly dilated     Social History   Socioeconomic History  . Marital status: Single    Spouse name: n/a  . Number of children: 0  . Years of education: Not on file  . Highest education level: Not on file  Occupational History  . Occupation: Chief Strategy Officer, [employer] representative  Social Needs  . Financial resource strain: Not on file  . Food insecurity:    Worry: Not on file    Inability: Not on file  . Transportation needs:    Medical: Not on file    Non-medical: Not on file  Tobacco Use  . Smoking status: Never Smoker  . Smokeless tobacco: Never Used  Substance and Sexual Activity  . Alcohol use: Yes    Comment: rarely  . Drug use: No  . Sexual activity: Not on file  Lifestyle  . Physical activity:    Days per week: Not on file    Minutes per session: Not on file  . Stress: Not on file  Relationships  . Social connections:    Talks on phone: Not on file    Gets together: Not on file    Attends religious service: Not on file    Active member of club or organization: Not on file    Attends meetings of clubs or organizations: Not on file    Relationship status: Not on file  . Intimate partner violence:    Fear of current or ex partner: Not on file    Emotionally  abused: Not on file    Physically abused: Not on file    Forced sexual activity: Not on file  Other Topics Concern  . Not on file  Social History Narrative   Lives alone.   Sister-in-Law lives next door.     Family History  Problem Relation Age of Onset  . Heart attack Mother   . Cancer Father   . Kidney failure Brother   . Prostate cancer Brother   . Heart attack Brother   . Colon cancer Neg Hx   . Esophageal cancer Neg Hx   . Rectal cancer Neg Hx   . Stomach cancer Neg Hx      Current Outpatient Medications on File Prior to Visit  Medication Sig Dispense Refill  . amLODipine (NORVASC) 5 MG tablet TAKE ONE TABLET BY MOUTH ONE TIME DAILY 30 tablet 6  . aspirin 325 MG tablet Take 325 mg by mouth daily.    . atenolol (TENORMIN) 50 MG tablet TAKE ONE TABLET BY MOUTH ONE TIME DAILY 30 tablet 0  . benzonatate (TESSALON) 100 MG capsule Take 1 capsule (100 mg total) by mouth 3 (three) times daily as needed for cough. 20 capsule 0  . calcium carbonate (TUMS - DOSED IN MG ELEMENTAL CALCIUM) 500 MG chewable tablet Chew 1 tablet (200 mg of elemental calcium total) by mouth 3 (three) times daily with meals. 90 tablet 0  . Cholecalciferol 50000 units capsule Take 1 capsule (50,000 Units total) by mouth once a week. (Patient not taking: Reported on 07/14/2017) 4 capsule 2  . clotrimazole-betamethasone (LOTRISONE) cream Apply 1 application topically 2 (two) times daily. 30 g 0  . doxycycline (VIBRA-TABS) 100 MG tablet Take 1 tablet (100 mg total) by mouth 2 (two) times daily. 6 tablet 0  . famotidine (PEPCID) 20 MG tablet Take 1 tablet (20 mg total) by mouth 2 (two) times daily. 60 tablet 5  . fluticasone (FLONASE) 50 MCG/ACT nasal spray INSTILL 2 SPRAYS INTO EACH NOSTRIL EVERY DAY 48 g 2  . Magnesium Oxide 400 MG CAPS Take 1 capsule (400 mg total) by mouth 2 (two) times daily. 60 capsule 0  . Omega-3 Fatty Acids (OMEGA 3 PO) Take 520 mg by mouth 2 (two) times daily.    . simvastatin (ZOCOR)  80 MG tablet TAKE 1 TABLET BY MOUTH AT BEDTIME. 30 tablet 0  . valsartan (DIOVAN) 160 MG tablet TAKE 1 TABLET BY MOUTH ONE TIME DAILY. 90 tablet 2  . vitamin B-12 (CYANOCOBALAMIN) 1000 MCG tablet Take 1,000 mcg by mouth daily.     No current facility-administered medications on file prior to visit.     Cardiovascular studies:  EKG   02/07/2018: Sinus rhythm 59 bpm. Borderline first degree AV block. Old inferior infarct.   Recent labs: 07/14/2017: H/H 13.7/41.  MCV 92.  Platelets 169 Glucose 119, BUN/creatinine 28/1.81.  EGFR 37.  Sodium 140, potassium 4.7  Review of Systems  Constitution: Negative for decreased appetite, malaise/fatigue, weight gain and weight loss.  HENT: Negative for congestion.   Eyes: Negative for visual disturbance.  Cardiovascular: Negative for chest pain, dyspnea on exertion, leg swelling, palpitations and syncope.  Respiratory: Negative for shortness of breath.   Endocrine: Negative for cold intolerance.  Hematologic/Lymphatic: Does not bruise/bleed easily.  Skin: Negative for itching and rash.  Musculoskeletal: Negative for myalgias.  Gastrointestinal: Negative for abdominal pain, nausea and vomiting.  Genitourinary: Negative for dysuria.  Neurological: Negative for dizziness and weakness.  Psychiatric/Behavioral: The patient is not nervous/anxious.   All other systems reviewed and are negative.        Physical Exam: Not performed, as this is a telephone visit        Assessment & Recommendations:  72-year-old male with coronary artery disease status post CABG 2001 vy Dr. Van Trigt (LIMA-LAD, SVG-Diag, seg SVG-ramus & OM, SVG-PDA), hypertension, CKD III w/IgA nephropathy, hyperlipidemia.  CAD: Stable. Recommend aspirin 81 mg, not 32 mg. Continue Crestor 20 mg.  Hypertension: Controlled.  CKD 3: Obtain labs from [facility].  Follow up in 6 months.    J , MD [facility] Pager: [phone] Office:  [phone] If no answer Cell [phone]     

## 2018-10-25 ENCOUNTER — Other Ambulatory Visit: Payer: Self-pay | Admitting: Nephrology

## 2018-10-25 DIAGNOSIS — R109 Unspecified abdominal pain: Secondary | ICD-10-CM

## 2018-10-29 ENCOUNTER — Ambulatory Visit
Admission: RE | Admit: 2018-10-29 | Discharge: 2018-10-29 | Disposition: A | Discharge status: Home / Self Care | Payer: Medicare Other | Source: Ambulatory Visit | Attending: Nephrology | Admitting: Nephrology

## 2018-10-29 ENCOUNTER — Other Ambulatory Visit: Payer: Self-pay

## 2018-10-29 DIAGNOSIS — N200 Calculus of kidney: Secondary | ICD-10-CM | POA: Diagnosis not present

## 2018-10-29 DIAGNOSIS — R109 Unspecified abdominal pain: Secondary | ICD-10-CM

## 2018-10-29 DIAGNOSIS — K8020 Calculus of gallbladder without cholecystitis without obstruction: Secondary | ICD-10-CM | POA: Diagnosis not present

## 2018-10-29 DIAGNOSIS — K5730 Diverticulosis of large intestine without perforation or abscess without bleeding: Secondary | ICD-10-CM | POA: Diagnosis not present

## 2018-10-29 IMAGING — CT CT ABDOMEN AND PELVIS WITHOUT CONTRAST
1 of 2 series · 13 of 32 positions shown, 18 images · non-contrast
Comparison: [DATE]

CLINICAL DATA: Follow-up enlarged lymph node. Nephrolithiasis.

EXAM:
CT ABDOMEN AND PELVIS WITHOUT CONTRAST
TECHNIQUE: Multidetector CT imaging of the abdomen and pelvis was performed
following the standard protocol without IV contrast.

[Series 2: renal standard/full · axial · 0.72mm/px · z∈[-387,-12]mm · 13 of 85 slices shown, 18 images]
[im 5/85  soft-tissue]
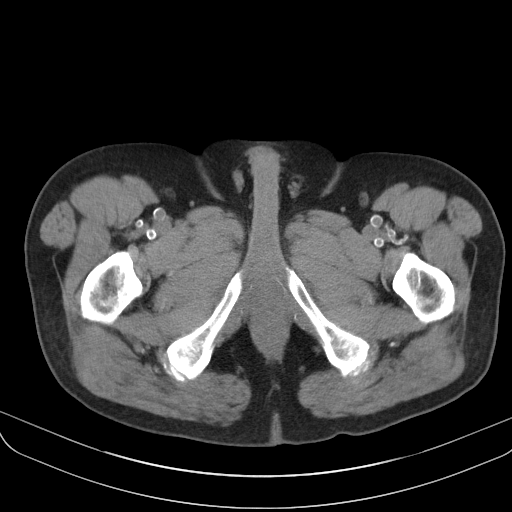
[im 5/85  bone]
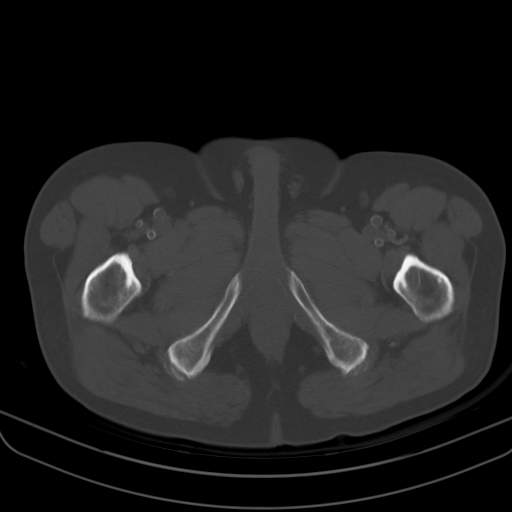
[im 13/85  soft-tissue]
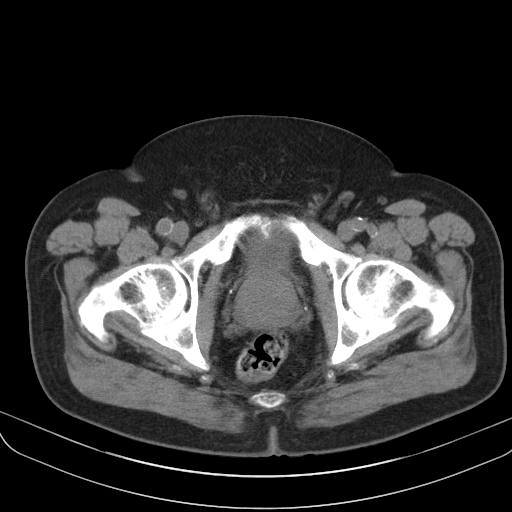
[im 17/85  soft-tissue]
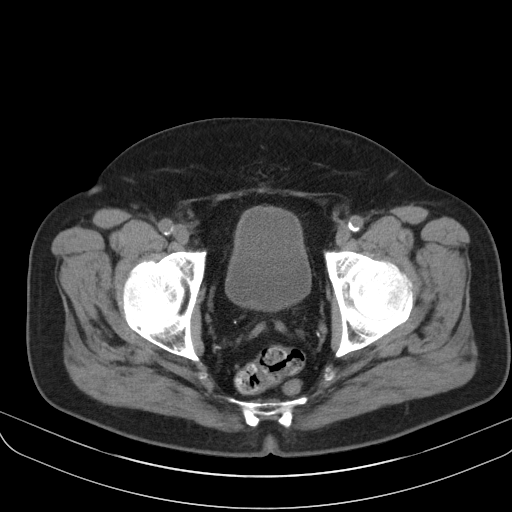
[im 26/85  soft-tissue]
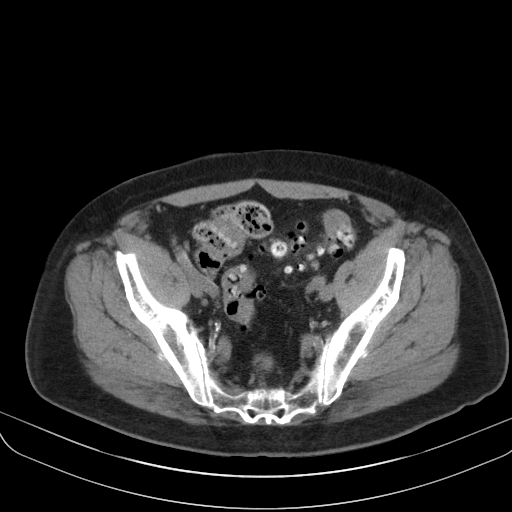
[im 34/85  soft-tissue]
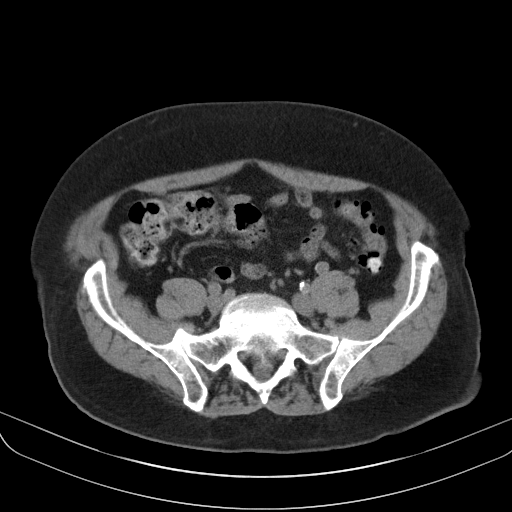
[im 38/85  soft-tissue]
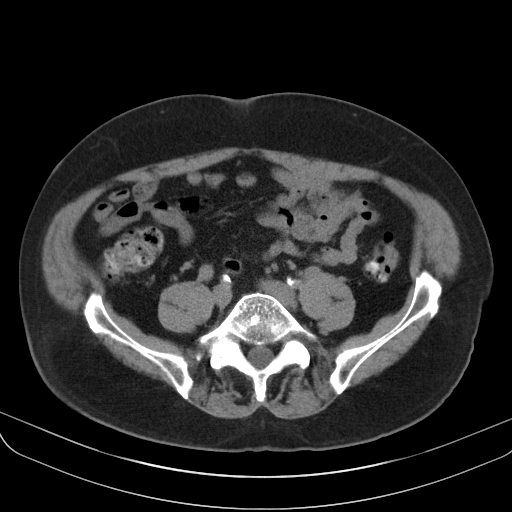
[im 47/85  soft-tissue]
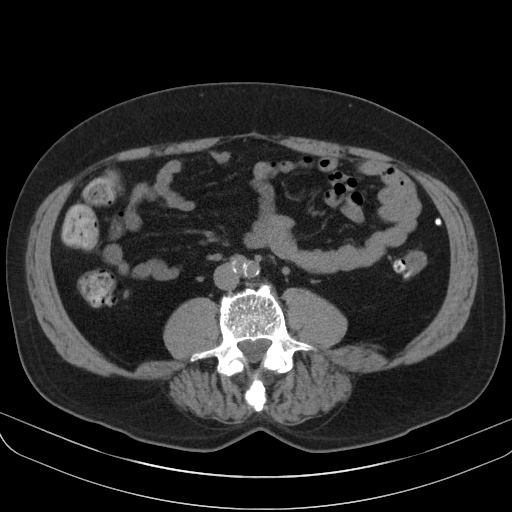
[im 51/85  soft-tissue]
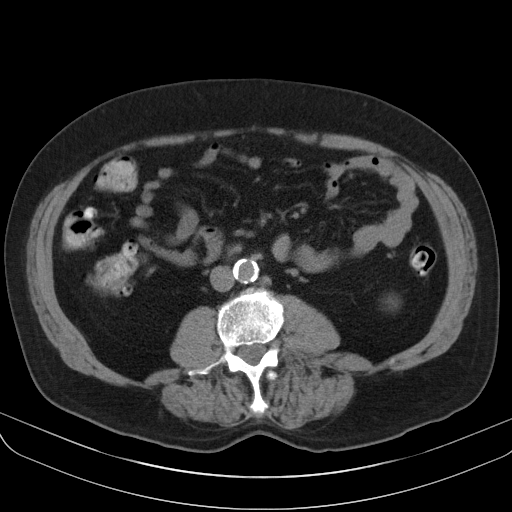
[im 59/85  soft-tissue]
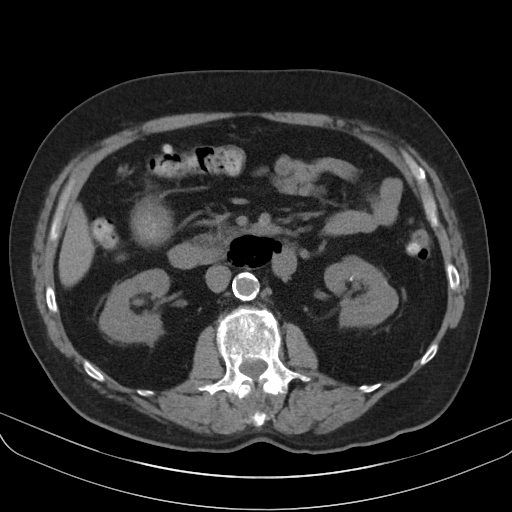
[im 59/85  bone]
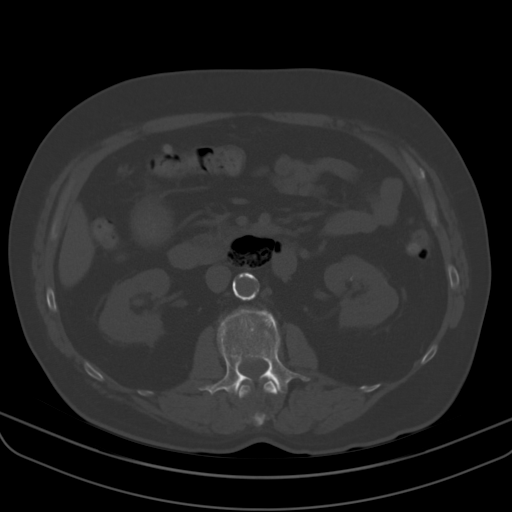
[im 68/85  soft-tissue]
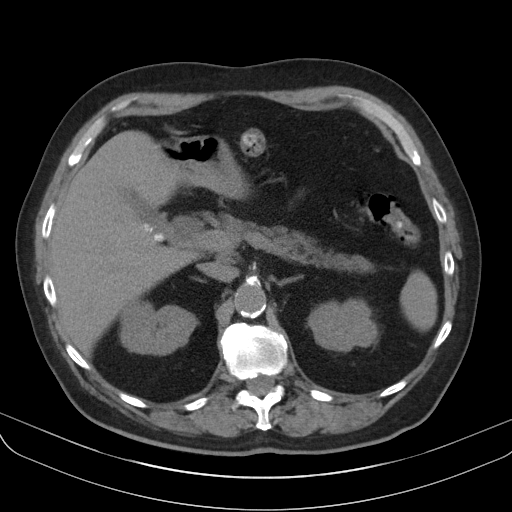
[im 68/85  lung]
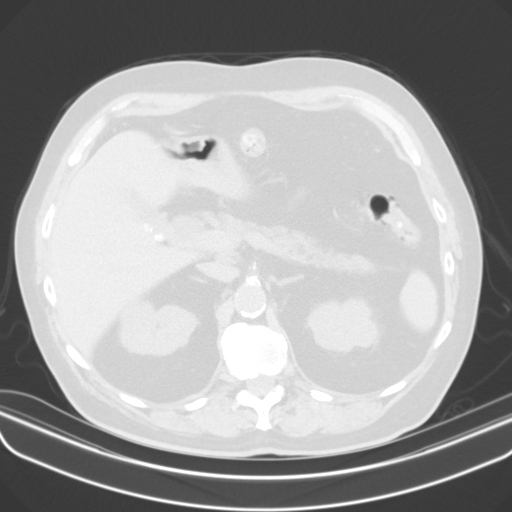
[im 72/85  soft-tissue]
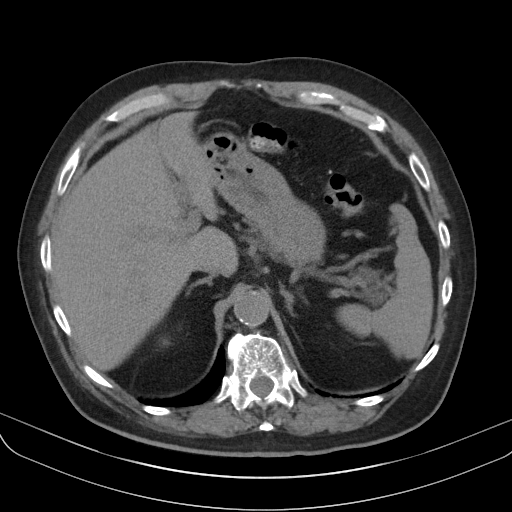
[im 72/85  lung]
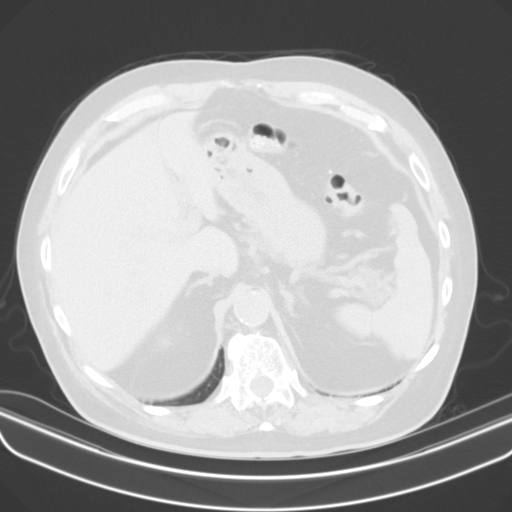
[im 76/85  lung]
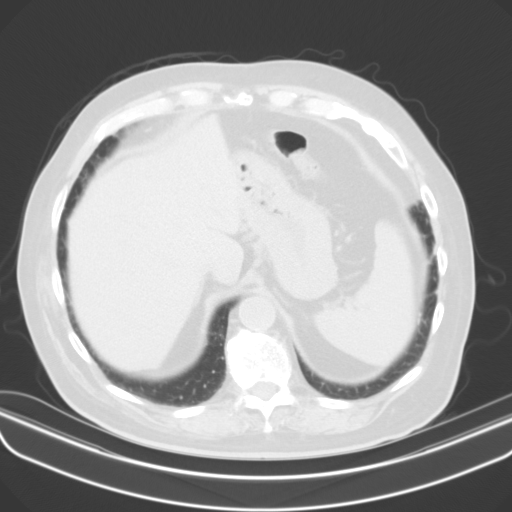
[im 80/85  soft-tissue]
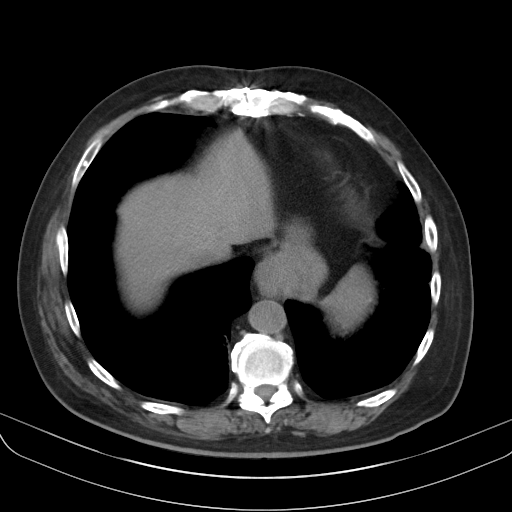
[im 80/85  lung]
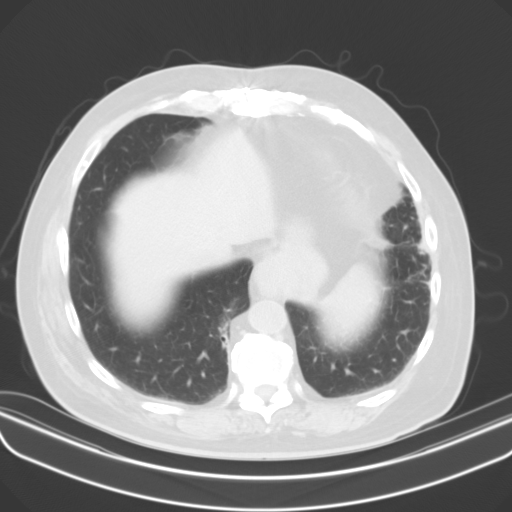

[13 of 32 positions shown; findings below may reference images not displayed]

FINDINGS: Lower chest: No acute findings.

Hepatobiliary: No mass visualized on this unenhanced exam. Multiple
tiny gallstones are seen, however there is no evidence of
cholecystitis or biliary ductal dilatation.

Pancreas: No mass or inflammatory process visualized on this
unenhanced exam.

Spleen:  Within normal limits in size.

Adrenals/Urinary tract: A few tiny less than 5 mm renal calculi are
again seen bilaterally. No evidence ureteral calculi or
hydronephrosis. Stable fluid attenuation cyst in lower pole of left
kidney. Unremarkable unopacified urinary bladder.

Stomach/Bowel: Stable small hiatal hernia. No evidence of
obstruction, inflammatory process, or abnormal fluid collections.
Diffuse colonic diverticulosis is seen, however there is no evidence
of diverticulitis.

Vascular/Lymphatic: 10 x 15 mm soft tissue density in the left
presacral space remains unchanged. This has nonspecific appearance,
and could represent a lymph node or other neoplasm. No other
lymphadenopathy or soft tissue masses identified. Aortic
atherosclerosis. No evidence of abdominal aortic aneurysm.

Reproductive: Mild-to-moderate prostate enlargement remains
unchanged.

Other:  None.

Musculoskeletal: No suspicious bone lesions identified. Old L2
vertebral body compression fracture appears stable.
IMPRESSION: 1. Stable 10 x 15 mm soft tissue density in left presacral space.
Differential diagnosis includes lymphadenopathy, or primary soft
tissue neoplasms such as of neurogenic or mesenchymal origin.
Consider further imaging evaluation with pelvic MRI without and with
contrast, or tissue sampling.
2. Stable enlarged prostate.
3. Colonic diverticulosis. No radiographic evidence of
diverticulitis.
4. Cholelithiasis, without radiographic evidence of cholecystitis.
5. Tiny nonobstructive bilateral renal calculi. No evidence of
ureteral calculi or hydronephrosis.
6. Stable small hiatal hernia.

Aortic Atherosclerosis ([RJ]-[RJ]).

## 2018-10-31 ENCOUNTER — Other Ambulatory Visit: Payer: Self-pay | Admitting: Cardiology

## 2018-10-31 DIAGNOSIS — I899 Noninfective disorder of lymphatic vessels and lymph nodes, unspecified: Secondary | ICD-10-CM | POA: Diagnosis not present

## 2018-10-31 DIAGNOSIS — N189 Chronic kidney disease, unspecified: Secondary | ICD-10-CM | POA: Diagnosis not present

## 2018-10-31 DIAGNOSIS — D631 Anemia in chronic kidney disease: Secondary | ICD-10-CM | POA: Diagnosis not present

## 2018-10-31 DIAGNOSIS — N183 Chronic kidney disease, stage 3 (moderate): Secondary | ICD-10-CM | POA: Diagnosis not present

## 2018-10-31 DIAGNOSIS — I129 Hypertensive chronic kidney disease with stage 1 through stage 4 chronic kidney disease, or unspecified chronic kidney disease: Secondary | ICD-10-CM | POA: Diagnosis not present

## 2018-10-31 DIAGNOSIS — I2510 Atherosclerotic heart disease of native coronary artery without angina pectoris: Secondary | ICD-10-CM | POA: Diagnosis not present

## 2018-10-31 NOTE — Telephone Encounter (Signed)
Please fill

## 2018-12-11 ENCOUNTER — Telehealth: Payer: Self-pay | Admitting: Oncology

## 2018-12-11 NOTE — Telephone Encounter (Signed)
Received a new patient referral from Dr. Madelon Lips for lymphadenopathy. Pt has been cld and scheduled to see Dr. Alen Blew on 7/30 at 2pm. Pt aware to arrive 20 minutes early.

## 2018-12-20 ENCOUNTER — Inpatient Hospital Stay: Payer: Medicare Other | Attending: Oncology | Admitting: Oncology

## 2018-12-20 ENCOUNTER — Other Ambulatory Visit: Payer: Self-pay

## 2018-12-20 ENCOUNTER — Other Ambulatory Visit: Payer: Self-pay | Admitting: Cardiology

## 2018-12-20 DIAGNOSIS — E785 Hyperlipidemia, unspecified: Secondary | ICD-10-CM

## 2018-12-20 DIAGNOSIS — Z7982 Long term (current) use of aspirin: Secondary | ICD-10-CM | POA: Diagnosis not present

## 2018-12-20 DIAGNOSIS — K219 Gastro-esophageal reflux disease without esophagitis: Secondary | ICD-10-CM

## 2018-12-20 DIAGNOSIS — R1907 Generalized intra-abdominal and pelvic swelling, mass and lump: Secondary | ICD-10-CM | POA: Diagnosis not present

## 2018-12-20 DIAGNOSIS — I10 Essential (primary) hypertension: Secondary | ICD-10-CM

## 2018-12-20 DIAGNOSIS — Z79899 Other long term (current) drug therapy: Secondary | ICD-10-CM

## 2018-12-20 NOTE — Progress Notes (Signed)
Reason for the request:    Pelvic lymphadenopathy  HPI: I was asked by Dr. Hollie Salk to evaluate [name] for a new finding of a pelvic mass.  He is a 73 year old gentleman with history of hypertension and coronary disease and chronic kidney disease.  He underwent a kidney biopsy in 2017 and was diagnosed IgA nephropathy.  He has been following with Dr. Hollie Salk at [facility] with recently stable renal function.  He had a CT scan of the abdomen and pelvis on July 31, 2018 without contrast due to complaints of abdominal pain.  The CT scan showed bilateral tiny nonobstructing kidney stones but a 9 mm short axis perirectal/presacral lymph node which is new since 2017.  A follow-up PET CT scan was recommended at that time.  This was completed on October 29, 2018.  The scan showed a stable 10 x 15 mm soft tissue density in the left presacral space.  The differential diagnosis felt to be soft tissue tremor versus enlarged lymph node.  Pelvic MRI was recommended.  Laboratory testing on October 31, 2018 showed a creatinine of 2.18 with normal electrolytes.  CBC showed a white cell count of 6.9, hemoglobin 13 with a platelet count of 144.  Iron studies were all normal with a ferritin of 154.  His LDH was normal at 175.  He is asymptomatic from these findings and was referred to me for further evaluation.  He denies any abdominal pain, flank pain or hematuria.  He denies any hematochezia or melena.  He did have a colonoscopy in the past but has been close to 10 years.   He does not report any headaches, blurry vision, syncope or seizures. Does not report any fevers, chills or sweats.  Does not report any cough, wheezing or hemoptysis.  Does not report any chest pain, palpitation, orthopnea or leg edema.  Does not report any nausea, vomiting or abdominal pain.  Does not report any constipation or diarrhea.  Does not report any skeletal complaints.    Does not report frequency, urgency or hematuria.  Does not report  any skin rashes or lesions. Does not report any heat or cold intolerance.  Does not report any lymphadenopathy or petechiae.  Does not report any anxiety or depression.  Remaining review of systems is negative.    Past Medical History:  Diagnosis Date  . Broken back   . CAD (coronary artery disease)    PCI & CABG  . CHF (congestive heart failure) ([place])   . Diverticulitis    mild - approx 2004  . Family history of heart disease   . GERD (gastroesophageal reflux disease)   . History of nuclear stress test 06/30/2011   lexiscan; normal pattern of perfusion post-stress; low risk scan   . Hyperlipidemia   . Hypertension   . IgA nephropathy   . Irregular heart beat   . Systemic hypertension   :  Past Surgical History:  Procedure Laterality Date  . CARDIAC CATHETERIZATION  08/1995   PTCA of OM (Dr. Marella Chimes)  . CORONARY ARTERY BYPASS GRAFT  01/2000   x5 - LIMA to LAD, SVG to diagonal, sequential SVG to ramus & OM, SVG to PDA (Dr. Tharon Aquas Trigt)  . LUNG LOBECTOMY     left upper lobe  . TRANSTHORACIC ECHOCARDIOGRAM  12/24/2012   EF 55-60%, mild LVH, mild conc hypertrophy; mild MR; LA mildly dilated  :   Current Outpatient Medications:  .  amLODipine (NORVASC) 5 MG tablet, TAKE  1 TABLET BY MOUTH EVERY DAY, Disp: 90 tablet, Rfl: 6 .  aspirin EC 81 MG tablet, Take 1 tablet (81 mg total) by mouth daily., Disp: 90 tablet, Rfl: 3 .  atenolol (TENORMIN) 50 MG tablet, TAKE ONE TABLET BY MOUTH ONE TIME DAILY, Disp: 30 tablet, Rfl: 0 .  calcium carbonate (OSCAL) 1500 (600 Ca) MG TABS tablet, Take by mouth daily., Disp: , Rfl:  .  clotrimazole-betamethasone (LOTRISONE) cream, Apply 1 application topically 2 (two) times daily. (Patient taking differently: Apply 1 application topically as needed. ), Disp: 30 g, Rfl: 0 .  cyclobenzaprine (FLEXERIL) 10 MG tablet, as needed., Disp: , Rfl:  .  famotidine (PEPCID) 20 MG tablet, Take 1 tablet (20 mg total) by mouth 2 (two) times daily., Disp: 60  tablet, Rfl: 5 .  fluticasone (FLONASE) 50 MCG/ACT nasal spray, INSTILL 2 SPRAYS INTO EACH NOSTRIL EVERY DAY, Disp: 48 g, Rfl: 2 .  Magnesium Oxide 400 MG CAPS, Take 1 capsule (400 mg total) by mouth 2 (two) times daily., Disp: 60 capsule, Rfl: 0 .  Omega-3 Fatty Acids (OMEGA 3 PO), Take 520 mg by mouth daily. , Disp: , Rfl:  .  rosuvastatin (CRESTOR) 20 MG tablet, Take 20 mg by mouth daily., Disp: , Rfl:  .  simvastatin (ZOCOR) 80 MG tablet, TAKE 1 TABLET BY MOUTH AT BEDTIME. (Patient not taking: Reported on 09/13/2018), Disp: 30 tablet, Rfl: 0 .  valsartan (DIOVAN) 160 MG tablet, TAKE 1 TABLET BY MOUTH ONE TIME DAILY., Disp: 90 tablet, Rfl: 2 .  vitamin B-12 (CYANOCOBALAMIN) 1000 MCG tablet, Take 1,000 mcg by mouth daily., Disp: , Rfl: :  No Known Allergies:  Family History  Problem Relation Age of Onset  . Heart attack Father   . Kidney failure Brother   . Prostate cancer Brother   . Heart attack Brother   . Colon cancer Neg Hx   . Esophageal cancer Neg Hx   . Rectal cancer Neg Hx   . Stomach cancer Neg Hx   :  Social History   Socioeconomic History  . Marital status: Single    Spouse name: n/a  . Number of children: 0  . Years of education: Not on file  . Highest education level: Not on file  Occupational History  . Occupation: Chief Strategy Officer, Holiday representative  Social Needs  . Financial resource strain: Not on file  . Food insecurity    Worry: Not on file    Inability: Not on file  . Transportation needs    Medical: Not on file    Non-medical: Not on file  Tobacco Use  . Smoking status: Never Smoker  . Smokeless tobacco: Never Used  Substance and Sexual Activity  . Alcohol use: Not Currently  . Drug use: No  . Sexual activity: Not on file  Lifestyle  . Physical activity    Days per week: Not on file    Minutes per session: Not on file  . Stress: Not on file  Relationships  . Social Herbalist on phone: Not on file    Gets together: Not on file     Attends religious service: Not on file    Active member of club or organization: Not on file    Attends meetings of clubs or organizations: Not on file    Relationship status: Not on file  . Intimate partner violence    Fear of current or ex partner: Not on file    Emotionally abused: Not  on file    Physically abused: Not on file    Forced sexual activity: Not on file  Other Topics Concern  . Not on file  Social History Narrative   Lives alone.   Sister-in-Law lives next door.  :  Pertinent items are noted in HPI.  Exam: Blood pressure 118/73, pulse 62, temperature 98.6 F (37 C), temperature source Oral, resp. rate 17, height 5\' 8"  (1.727 m), weight 170 lb 4.8 oz (77.2 kg), SpO2 97 %.    General appearance: alert and cooperative appeared without distress. Head: atraumatic without any abnormalities. Eyes: conjunctivae/corneas clear. PERRL.  Sclera anicteric. Throat: lips, mucosa, and tongue normal; without oral thrush or ulcers. Resp: clear to auscultation bilaterally without rhonchi, wheezes or dullness to percussion. Cardio: regular rate and rhythm, S1, S2 normal, no murmur, click, rub or gallop GI: soft, non-tender; bowel sounds normal; no masses,  no organomegaly Skin: Skin color, texture, turgor normal. No rashes or lesions Lymph nodes: Cervical, supraclavicular, and axillary nodes normal. Neurologic: Grossly normal without any motor, sensory or deep tendon reflexes. Musculoskeletal: No joint deformity or effusion.  CBC    Component Value Date/Time   WBC 8.8 07/14/2017 1658   WBC 7.4 07/07/2017 0834   WBC 8.9 04/11/2016 0615   RBC 4.47 07/14/2017 1658   RBC 4.19 (A) 07/07/2017 0834   RBC 4.29 04/11/2016 0615   HGB 13.7 07/14/2017 1658   HCT 41.0 07/14/2017 1658   PLT 169 07/14/2017 1658   MCV 92 07/14/2017 1658   MCH 30.6 07/14/2017 1658   MCH 30.5 07/07/2017 0834   MCH 30.3 04/11/2016 0615   MCHC 33.4 07/14/2017 1658   MCHC 33.8 07/07/2017 0834   MCHC 32.8  04/11/2016 0615   RDW 14.2 07/14/2017 1658   LYMPHSABS 1.6 11/06/2015 1130   MONOABS 0.7 11/06/2015 1130   EOSABS 0.2 11/06/2015 1130   BASOSABS 0.1 11/06/2015 1130     Chemistry      Component Value Date/Time   NA 140 12/09/2016 0932   K 5.2 12/09/2016 0932   CL 105 12/09/2016 0932   CO2 21 12/09/2016 0932   BUN 29 (H) 12/09/2016 0932   CREATININE 2.00 (H) 12/09/2016 0932   CREATININE 1.61 (H) 01/21/2016 1030      Component Value Date/Time   CALCIUM 9.6 12/09/2016 0932   CALCIUM 7.0 (L) 10/09/2015 0458   ALKPHOS 55 01/21/2016 1030   AST 14 01/21/2016 1030   ALT 19 01/21/2016 1030   BILITOT 0.3 01/21/2016 1030      EXAM: CT ABDOMEN AND PELVIS WITHOUT CONTRAST  TECHNIQUE: Multidetector CT imaging of the abdomen and pelvis was performed following the standard protocol without IV contrast.  COMPARISON:  07/31/2018  FINDINGS: Lower chest: No acute findings.  Hepatobiliary: No mass visualized on this unenhanced exam. Multiple tiny gallstones are seen, however there is no evidence of cholecystitis or biliary ductal dilatation.  Pancreas: No mass or inflammatory process visualized on this unenhanced exam.  Spleen:  Within normal limits in size.  Adrenals/Urinary tract: A few tiny less than 5 mm renal calculi are again seen bilaterally. No evidence ureteral calculi or hydronephrosis. Stable fluid attenuation cyst in lower pole of left kidney. Unremarkable unopacified urinary bladder.  Stomach/Bowel: Stable small hiatal hernia. No evidence of obstruction, inflammatory process, or abnormal fluid collections. Diffuse colonic diverticulosis is seen, however there is no evidence of diverticulitis.  Vascular/Lymphatic: 10 x 15 mm soft tissue density in the left presacral space remains unchanged. This has nonspecific appearance,  and could represent a lymph node or other neoplasm. No other lymphadenopathy or soft tissue masses identified.  Aortic atherosclerosis. No evidence of abdominal aortic aneurysm.  Reproductive: Mild-to-moderate prostate enlargement remains unchanged.  Other:  None.  Musculoskeletal: No suspicious bone lesions identified. Old L2 vertebral body compression fracture appears stable.  IMPRESSION: 1. Stable 10 x 15 mm soft tissue density in left presacral space. Differential diagnosis includes lymphadenopathy, or primary soft tissue neoplasms such as of neurogenic or mesenchymal origin. Consider further imaging evaluation with pelvic MRI without and with contrast, or tissue sampling. 2. Stable enlarged prostate. 3. Colonic diverticulosis. No radiographic evidence of diverticulitis. 4. Cholelithiasis, without radiographic evidence of cholecystitis. 5. Tiny nonobstructive bilateral renal calculi. No evidence of ureteral calculi or hydronephrosis. 6. Stable small hiatal hernia.  Aortic Atherosclerosis (ICD10-I70.0).   Assessment and Plan:   73 year old with:  1.  Left presacral mass detected on CT scan in March 2020.  Repeat a CT scan in June 2020 showed a 10 x 15 mm soft tissue density in the left presacral space without any other abnormalities.  The mass appeared to be stable over a period of time.  The differential diagnosis was reviewed today with the patient and treatment options were discussed.  Metastatic malignancy into pelvic lymph nodes is a possibility.  These could include colon cancer, anal cancer and squamous cell carcinoma of the skin and present in such way.  Given the fact that imaging studies did not show any evidence of other lymph node involvement makes this possibility less likely.  Primary and lymph node malignancy such as lymphoma is also less likely.  Benign tumors or low-grade tumors with small Garing potential could also be a possibility.  For management options, I recommended obtaining an MRI based on radiology recommendation and potentially proceed with a biopsy if  suspicious findings are noted.  Continued active surveillance could also be a possibility if these findings are equivocal.  After discussion he is agreeable to proceed with MRI and will make a decision based on these findings.  I have also recommended proceeding with a colonoscopy for colon cancer screening in any case.  2.  Follow-up: Will be determined pending the results of his MRI.   40  minutes was spent with the patient face-to-face today.  More than 50% of time was dedicated to reviewing imaging studies, discussing differential diagnosis and management options.     Thank you for the referral.   A copy of this consult has been forwarded to the requesting physician.

## 2018-12-20 NOTE — Telephone Encounter (Signed)
Please fill if necessary

## 2018-12-28 ENCOUNTER — Ambulatory Visit (HOSPITAL_COMMUNITY)
Admission: RE | Admit: 2018-12-28 | Discharge: 2018-12-28 | Disposition: A | Discharge status: Home / Self Care | Payer: Medicare Other | Source: Ambulatory Visit | Attending: Oncology | Admitting: Oncology

## 2018-12-28 ENCOUNTER — Other Ambulatory Visit: Payer: Self-pay

## 2018-12-28 DIAGNOSIS — K5730 Diverticulosis of large intestine without perforation or abscess without bleeding: Secondary | ICD-10-CM | POA: Diagnosis not present

## 2018-12-28 DIAGNOSIS — R1907 Generalized intra-abdominal and pelvic swelling, mass and lump: Secondary | ICD-10-CM | POA: Diagnosis not present

## 2018-12-28 IMAGING — MR MRI PELVIS WITHOUT CONTRAST
4 of 5 series · 19 of 48 positions shown · IV contrast (Y)
Comparison: Abdominopelvic CT [DATE], [DATE] and
[DATE].

CLINICAL DATA: Presacral soft tissue mass on CT. No given history
of malignancy.

EXAM:
MRI PELVIS WITHOUT CONTRAST
TECHNIQUE: Multiplanar multisequence MR imaging of the pelvis was performed. No
intravenous contrast was administered.

[Series 3: T1 · axial · 4.0mm · 0.62mm/px · z∈[-187,+48]mm · 10 of 54 slices shown (1 of 2)]
[im 1/54]
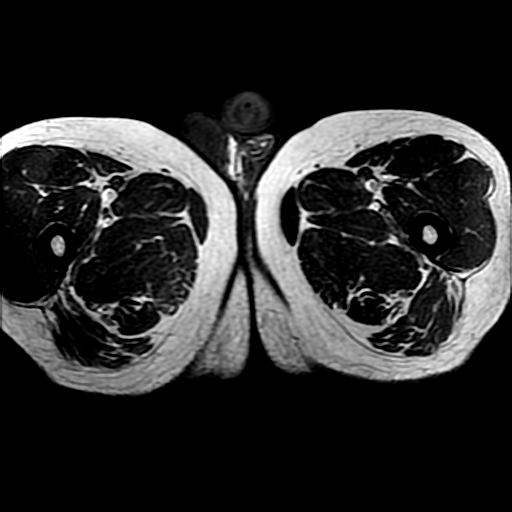
[im 6/54]
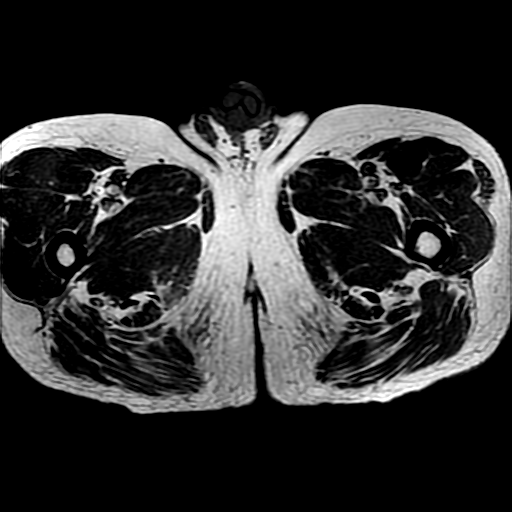
[im 11/54]
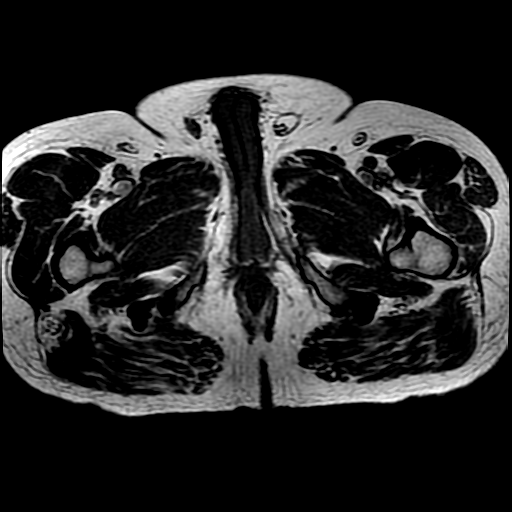
[im 16/54]
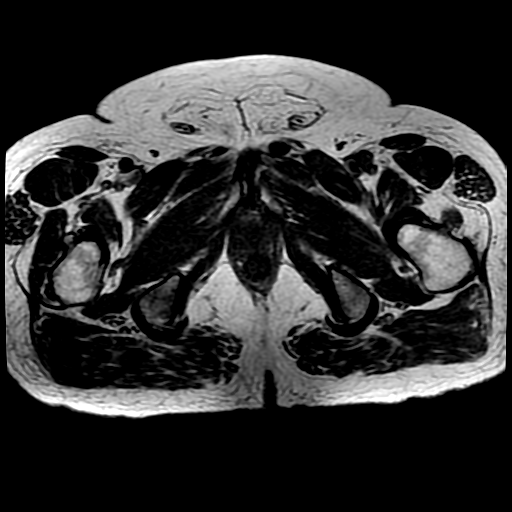
[im 22/54]
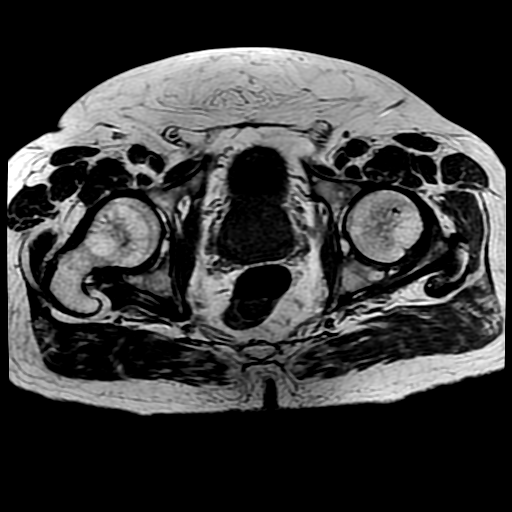
[im 27/54]
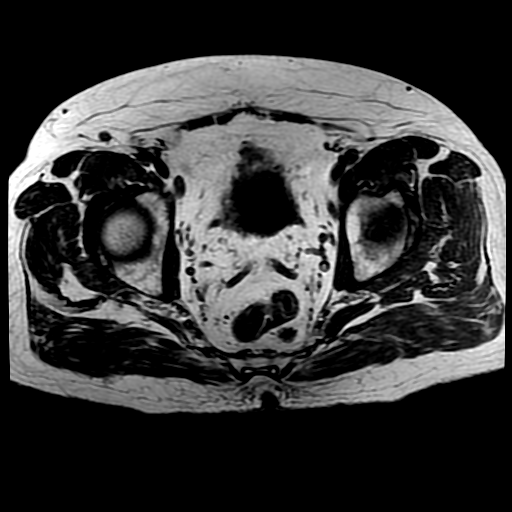
[im 32/54]
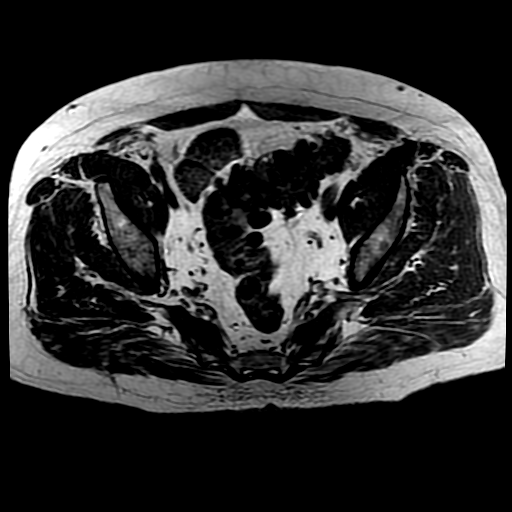
[im 38/54]
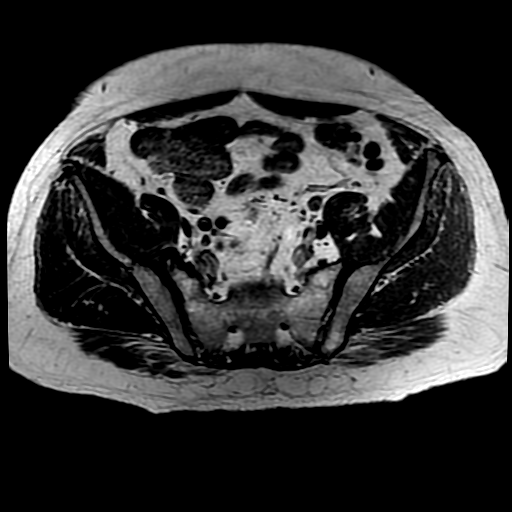
[im 43/54]
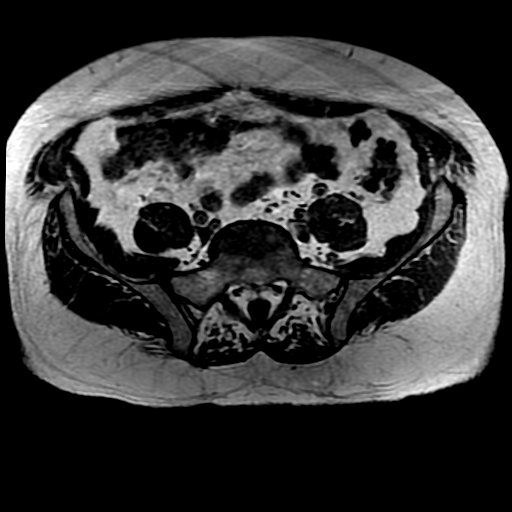
[im 48/54]
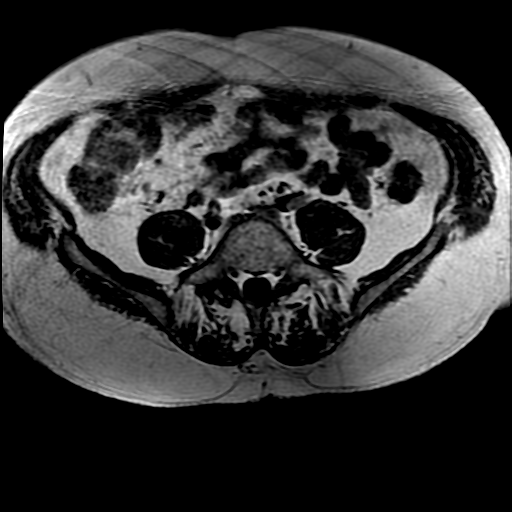

[Series 4: T2 fat-sat · axial · 4.0mm · 0.62mm/px · z∈[-162,+48]mm · 3 of 54 slices shown]
[im 6/54]
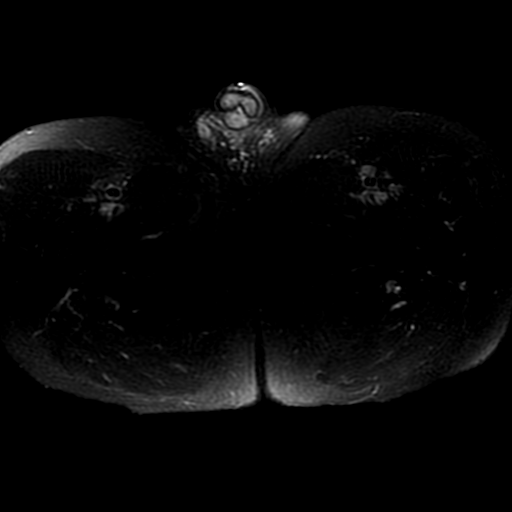
[im 27/54]
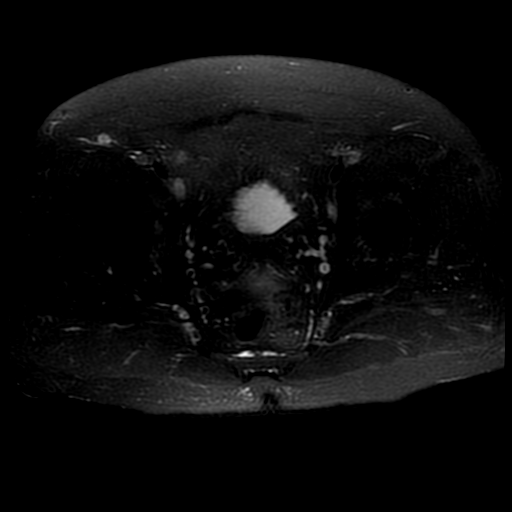
[im 48/54]
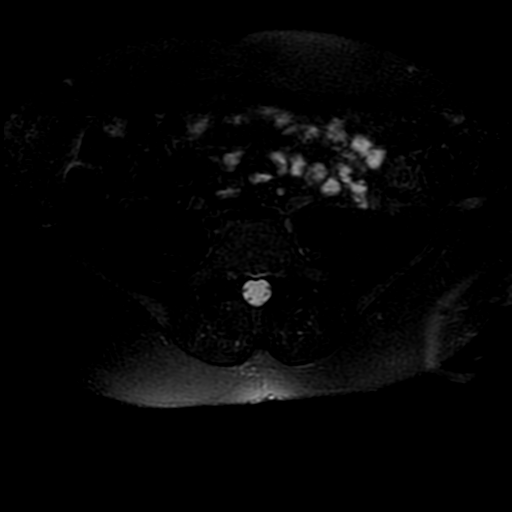

[Series 5: T1 · coronal · 4.0mm · 0.78mm/px · 3 of 38 slices shown (2 of 2)]
[im 7/38]
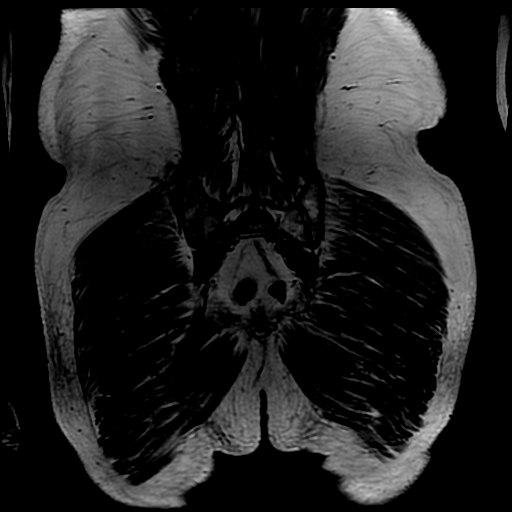
[im 19/38]
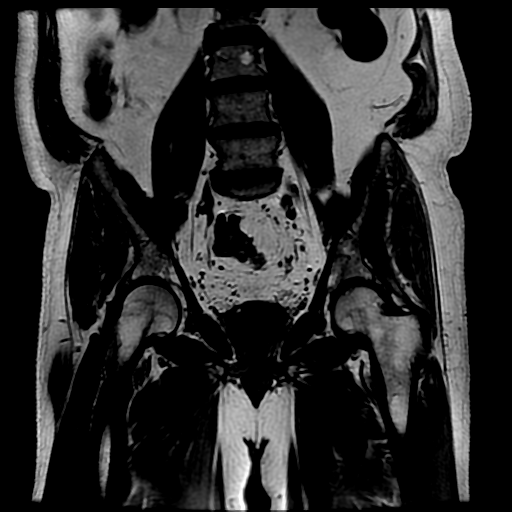
[im 31/38]
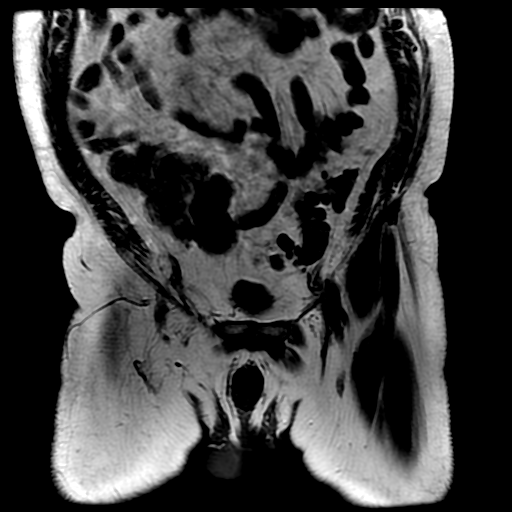

[Series 6: cor fse ir · coronal · 4.0mm · 0.78mm/px · 3 of 38 slices shown]
[im 7/38]
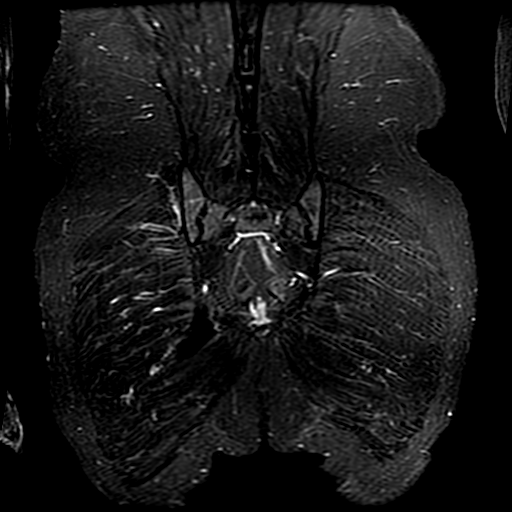
[im 19/38]
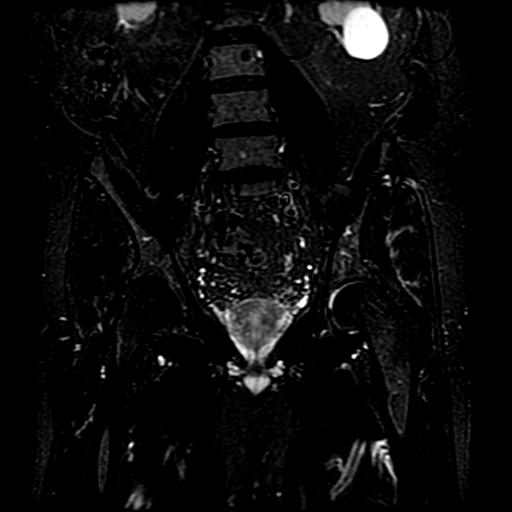
[im 31/38]
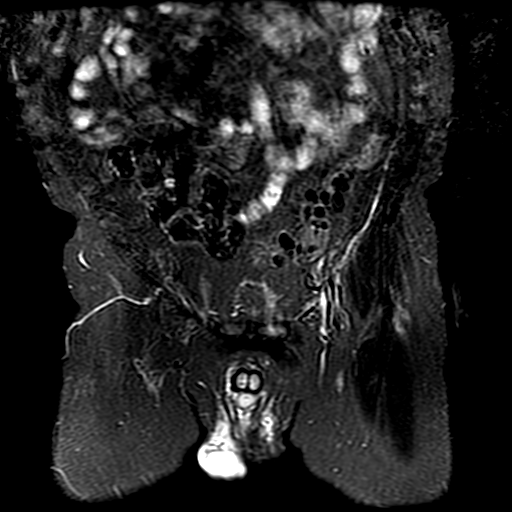

[19 of 48 positions shown; findings below may reference images not displayed]

FINDINGS: Urinary Tract: The visualized distal ureters appear normal. Mild
bladder wall thickening versus incomplete distention.

Bowel: Extensive diverticular changes are again noted throughout the
descending and sigmoid colon with mild wall thickening. No
surrounding inflammatory change. No evidence of bowel obstruction.

Vascular/Lymphatic: Left presacral soft tissue is unchanged from the
recent CT, measuring 16 x 12 mm on image [DATE]. This is new from the
[MF] CT and could reflect a lymph node. This demonstrates
intermediate T1 and low T2 signal. There is another smaller
presacral nodule more superiorly measuring 8 mm on image [DATE]. There
are no other enlarged pelvic lymph nodes. In correlation with CT,
aortoiliac atherosclerosis without acute findings.

Reproductive: Moderate enlargement of the prostate gland without
focal abnormality.

Other: Intact anterior abdominal wall.  No ascites.

Musculoskeletal: No acute osseous findings. Stable disc bulging in
the lower lumbar spine.
IMPRESSION: 1. The presacral soft tissue nodule is unchanged from the recent CTs
and is not further characterized by this noncontrast study. It is
new from [MF]. There is an additional smaller presacral nodule more
superiorly. These are indeterminate and potentially nodal
metastases. Consider tissue sampling and colonoscopy if not recently
performed.
2. No acute pelvic findings.
3. Diffuse sigmoid diverticulosis.
4. Moderate enlargement of the prostate gland.

## 2018-12-31 ENCOUNTER — Other Ambulatory Visit: Payer: Self-pay | Admitting: Oncology

## 2018-12-31 DIAGNOSIS — R1907 Generalized intra-abdominal and pelvic swelling, mass and lump: Secondary | ICD-10-CM

## 2018-12-31 NOTE — Progress Notes (Signed)
The results of the MRI were reviewed and discussed with the patient today.  The MRI did not reveal any clear-cut etiology and these findings remains worrisome for possible pelvic malignancy arising from colorectal etiology.  GI work-up is warranted at this time which includes a colonoscopy and potentially rectal ultrasound to evaluate these lymph nodes.  I will make the appropriate referral to gastroenterology for evaluation in the near future.  If the work-up is unrevealing, repeat imaging studies in 6 months would be reasonable at that time.

## 2019-01-17 ENCOUNTER — Other Ambulatory Visit: Payer: Self-pay

## 2019-03-08 ENCOUNTER — Other Ambulatory Visit: Payer: Self-pay | Admitting: Cardiology

## 2019-03-08 DIAGNOSIS — I2510 Atherosclerotic heart disease of native coronary artery without angina pectoris: Secondary | ICD-10-CM

## 2019-03-15 ENCOUNTER — Encounter: Payer: Self-pay | Admitting: Cardiology

## 2019-03-18 ENCOUNTER — Ambulatory Visit (INDEPENDENT_AMBULATORY_CARE_PROVIDER_SITE_OTHER): Payer: Medicare Other | Admitting: Cardiology

## 2019-03-18 ENCOUNTER — Encounter: Payer: Self-pay | Admitting: Cardiology

## 2019-03-18 ENCOUNTER — Other Ambulatory Visit: Payer: Self-pay

## 2019-03-18 VITALS — BP 141/69 | HR 63 | Ht 68.0 in | Wt 172.0 lb

## 2019-03-18 DIAGNOSIS — I10 Essential (primary) hypertension: Secondary | ICD-10-CM

## 2019-03-18 DIAGNOSIS — I129 Hypertensive chronic kidney disease with stage 1 through stage 4 chronic kidney disease, or unspecified chronic kidney disease: Secondary | ICD-10-CM | POA: Diagnosis not present

## 2019-03-18 DIAGNOSIS — N1832 Chronic kidney disease, stage 3b: Secondary | ICD-10-CM

## 2019-03-18 DIAGNOSIS — I2510 Atherosclerotic heart disease of native coronary artery without angina pectoris: Secondary | ICD-10-CM | POA: Diagnosis not present

## 2019-03-18 NOTE — Progress Notes (Signed)
Subjective:   Jared White, male    DOB: 04-27-1946, 73 y.o.   MRN: [id number]  Chief complaint:  Coronary artery disease    HPI  73 year old male with coronary artery disease status post CABG 2001 vy Dr. Lucianne Lei Trigt (LIMA-LAD, SVG-Diag, seg SVG-ramus & OM, SVG-PDA), hypertension, CKD III w/IgA nephropathy, hyperlipidemia.  He is now off amlodipine due to an episode of hypotension 3-4 months ago. He denies chest pain, shortness of breath, palpitations, leg edema, orthopnea, PND, TIA/syncope.   Past Medical History:  Diagnosis Date  . Broken back   . CAD (coronary artery disease)    PCI & CABG  . CHF (congestive heart failure) ([place])   . Diverticulitis    mild - approx 2004  . Family history of heart disease   . GERD (gastroesophageal reflux disease)   . History of nuclear stress test 06/30/2011   lexiscan; normal pattern of perfusion post-stress; low risk scan   . Hyperlipidemia   . Hypertension   . IgA nephropathy   . Irregular heart beat   . Systemic hypertension      Past Surgical History:  Procedure Laterality Date  . CARDIAC CATHETERIZATION  08/1995   PTCA of OM (Dr. Marella Chimes)  . CORONARY ARTERY BYPASS GRAFT  01/2000   x5 - LIMA to LAD, SVG to diagonal, sequential SVG to ramus & OM, SVG to PDA (Dr. Tharon Aquas Trigt)  . LUNG LOBECTOMY     left upper lobe  . TRANSTHORACIC ECHOCARDIOGRAM  12/24/2012   EF 55-60%, mild LVH, mild conc hypertrophy; mild MR; LA mildly dilated     Social History   Socioeconomic History  . Marital status: Single    Spouse name: n/a  . Number of children: 0  . Years of education: Not on file  . Highest education level: Not on file  Occupational History  . Occupation: Chief Strategy Officer, Holiday representative  Social Needs  . Financial resource strain: Not on file  . Food insecurity    Worry: Not on file    Inability: Not on file  . Transportation needs    Medical: Not on file    Non-medical: Not on file  Tobacco Use  . Smoking status:  Never Smoker  . Smokeless tobacco: Never Used  Substance and Sexual Activity  . Alcohol use: Not Currently  . Drug use: No  . Sexual activity: Not on file  Lifestyle  . Physical activity    Days per week: Not on file    Minutes per session: Not on file  . Stress: Not on file  Relationships  . Social Herbalist on phone: Not on file    Gets together: Not on file    Attends religious service: Not on file    Active member of club or organization: Not on file    Attends meetings of clubs or organizations: Not on file    Relationship status: Not on file  . Intimate partner violence    Fear of current or ex partner: Not on file    Emotionally abused: Not on file    Physically abused: Not on file    Forced sexual activity: Not on file  Other Topics Concern  . Not on file  Social History Narrative   Lives alone.   Sister-in-Law lives next door.     Family History  Problem Relation Age of Onset  . Heart attack Father   . Kidney failure Brother   . Prostate  cancer Brother   . Heart attack Brother   . Heart disease Brother   . Hypertension Brother   . Heart disease Brother   . Hypertension Brother   . Colon cancer Neg Hx   . Esophageal cancer Neg Hx   . Rectal cancer Neg Hx   . Stomach cancer Neg Hx      Current Outpatient Medications on File Prior to Visit  Medication Sig Dispense Refill  . aspirin EC 81 MG tablet Take 1 tablet (81 mg total) by mouth daily. 90 tablet 3  . atenolol (TENORMIN) 50 MG tablet TAKE 1 TABLET BY MOUTH EVERY DAY 90 tablet 0  . calcium carbonate (OSCAL) 1500 (600 Ca) MG TABS tablet Take by mouth daily.    . clotrimazole-betamethasone (LOTRISONE) cream Apply 1 application topically 2 (two) times daily. (Patient taking differently: Apply 1 application topically as needed. ) 30 g 0  . famotidine (PEPCID) 20 MG tablet Take 1 tablet (20 mg total) by mouth 2 (two) times daily. 60 tablet 5  . fluticasone (FLONASE) 50 MCG/ACT nasal spray INSTILL  2 SPRAYS INTO EACH NOSTRIL EVERY DAY 48 g 2  . Magnesium Oxide 400 MG CAPS Take 1 capsule (400 mg total) by mouth 2 (two) times daily. 60 capsule 0  . Omega-3 Fatty Acids (OMEGA 3 PO) Take 520 mg by mouth daily.     . rosuvastatin (CRESTOR) 20 MG tablet TAKE 1 TABLET BY MOUTH EVERY DAY 90 tablet 2  . valsartan (DIOVAN) 160 MG tablet TAKE 1 TABLET BY MOUTH ONE TIME DAILY. 90 tablet 2  . vitamin B-12 (CYANOCOBALAMIN) 1000 MCG tablet Take 1,000 mcg by mouth daily.    Marland Kitchen amLODipine (NORVASC) 5 MG tablet TAKE 1 TABLET BY MOUTH EVERY DAY (Patient not taking: Reported on 03/18/2019) 90 tablet 6   No current facility-administered medications on file prior to visit.     Cardiovascular studies:  EKG 03/18/2019: Sinus rhythm 62 bpm. Normal EKG.  Recent labs: 07/14/2017: H/H 13.7/41.  MCV 92.  Platelets 169 Glucose 119, BUN/creatinine 28/1.81.  EGFR 37.  Sodium 140, potassium 4.7  Review of Systems  Constitution: Negative for decreased appetite, malaise/fatigue, weight gain and weight loss.  HENT: Negative for congestion.   Eyes: Negative for visual disturbance.  Cardiovascular: Negative for chest pain, dyspnea on exertion, leg swelling, palpitations and syncope.  Respiratory: Negative for shortness of breath.   Endocrine: Negative for cold intolerance.  Hematologic/Lymphatic: Does not bruise/bleed easily.  Skin: Negative for itching and rash.  Musculoskeletal: Negative for myalgias.  Gastrointestinal: Negative for abdominal pain, nausea and vomiting.  Genitourinary: Negative for dysuria.  Neurological: Negative for dizziness and weakness.  Psychiatric/Behavioral: The patient is not nervous/anxious.   All other systems reviewed and are negative.      Vitals:   03/18/19 1426  BP: (!) 141/69  Pulse: 63  SpO2: 97%    Physical Exam: Physical Exam  Constitutional: He is oriented to person, place, and time. He appears well-developed and well-nourished. No distress.  HENT:  Head:  Normocephalic and atraumatic.  Eyes: Pupils are equal, round, and reactive to light. Conjunctivae are normal.  Neck: No JVD present.  Cardiovascular: Normal rate, regular rhythm and intact distal pulses.  No murmur heard. Pulmonary/Chest: Effort normal and breath sounds normal. He has no wheezes. He has no rales.  Abdominal: Soft. Bowel sounds are normal. There is no rebound.  Musculoskeletal:        General: No edema.  Lymphadenopathy:    He has  no cervical adenopathy.  Neurological: He is alert and oriented to person, place, and time. No cranial nerve deficit.  Skin: Skin is warm and dry.  Psychiatric: He has a normal mood and affect.  Nursing note and vitals reviewed.         Assessment & Recommendations:  73 year old male with coronary artery disease status post CABG 2001 vy Dr. Lucianne Lei Trigt (LIMA-LAD, SVG-Diag, seg SVG-ramus & OM, SVG-PDA), hypertension, CKD III w/IgA nephropathy, hyperlipidemia.   CAD: Stable. Recommend aspirin 81 mg, Crestor 20 mg. Will check lipid panel.  Hypertension: Controlled.  CKD 3: F/u w/nephrology.  Follow up in 1 year.  Nigel Mormon, MD [facility] Pager: [phone] Office: [phone] If no answer Cell [phone]

## 2019-05-07 ENCOUNTER — Other Ambulatory Visit: Payer: Self-pay | Admitting: Cardiology

## 2019-06-06 DIAGNOSIS — I129 Hypertensive chronic kidney disease with stage 1 through stage 4 chronic kidney disease, or unspecified chronic kidney disease: Secondary | ICD-10-CM | POA: Diagnosis not present

## 2019-06-06 DIAGNOSIS — N1831 Chronic kidney disease, stage 3a: Secondary | ICD-10-CM | POA: Diagnosis not present

## 2019-06-06 DIAGNOSIS — N189 Chronic kidney disease, unspecified: Secondary | ICD-10-CM | POA: Diagnosis not present

## 2019-06-06 DIAGNOSIS — I899 Noninfective disorder of lymphatic vessels and lymph nodes, unspecified: Secondary | ICD-10-CM | POA: Diagnosis not present

## 2019-06-06 DIAGNOSIS — N028 Recurrent and persistent hematuria with other morphologic changes: Secondary | ICD-10-CM | POA: Diagnosis not present

## 2019-06-06 DIAGNOSIS — D631 Anemia in chronic kidney disease: Secondary | ICD-10-CM | POA: Diagnosis not present

## 2019-06-07 ENCOUNTER — Encounter: Payer: Self-pay | Admitting: Gastroenterology

## 2019-07-09 ENCOUNTER — Ambulatory Visit: Payer: Medicare Other | Admitting: Gastroenterology

## 2019-07-17 ENCOUNTER — Other Ambulatory Visit: Payer: Self-pay

## 2019-07-17 MED ORDER — VALSARTAN 160 MG PO TABS: ORAL_TABLET | ORAL | 2 refills | Status: DC

## 2019-07-25 ENCOUNTER — Other Ambulatory Visit: Payer: Self-pay

## 2019-07-25 ENCOUNTER — Encounter: Payer: Self-pay | Admitting: Gastroenterology

## 2019-07-25 ENCOUNTER — Ambulatory Visit (INDEPENDENT_AMBULATORY_CARE_PROVIDER_SITE_OTHER): Payer: Medicare Other | Admitting: Gastroenterology

## 2019-07-25 VITALS — BP 118/70 | HR 59 | Temp 96.8°F | Ht 68.5 in | Wt 171.0 lb

## 2019-07-25 DIAGNOSIS — K625 Hemorrhage of anus and rectum: Secondary | ICD-10-CM

## 2019-07-25 DIAGNOSIS — K5900 Constipation, unspecified: Secondary | ICD-10-CM

## 2019-07-25 DIAGNOSIS — K219 Gastro-esophageal reflux disease without esophagitis: Secondary | ICD-10-CM | POA: Diagnosis not present

## 2019-07-25 DIAGNOSIS — R935 Abnormal findings on diagnostic imaging of other abdominal regions, including retroperitoneum: Secondary | ICD-10-CM | POA: Diagnosis not present

## 2019-07-25 DIAGNOSIS — K649 Unspecified hemorrhoids: Secondary | ICD-10-CM

## 2019-07-25 MED ORDER — SUPREP BOWEL PREP KIT 17.5-3.13-1.6 GM/177ML PO SOLN: 1.0000 | ORAL | 0 refills | Status: DC

## 2019-07-25 NOTE — Patient Instructions (Addendum)
Toileting tips to help with your constipation - Drink at least 64-80 ounces of water/liquid per day. - Establish a time to try to move your bowels every day.  For many people, this is after a cup of coffee or after a meal such as breakfast. - Sit all of the way back on the toilet keeping your back fairly straight and while sitting up, try to rest the tops of your forearms on your upper thighs.   - Raising your feet with a step stool/squatty potty can be helpful to improve the angle that allows your stool to pass through the rectum. - Relax the rectum feeling it bulge toward the toilet water.  If you feel your rectum raising toward your body, you are contracting rather than relaxing. - Breathe in and slowly exhale. "Belly breath" by expanding your belly towards your belly button. Keep belly expanded as you gently direct pressure down and back to the anus.  A low pitched GRRR sound can assist with increasing intra-abdominal pressure.  - Repeat 3-4 times. If unsuccessful, contract the pelvic floor to restore normal tone and get off the toilet.  Avoid excessive straining. - To reduce excessive wiping by teaching your anus to normally contract, place hands on outer aspect of knees and resist knee movement outward.  Hold 5-10 second then place hands just inside of knees and resist inward movement of knees.  Hold 5 seconds.  Repeat a few times each way.  You have been scheduled for a colonoscopy. Please follow written instructions given to you at your visit today.  Please pick up your prep supplies at the pharmacy within the next 1-3 days. If you use inhalers (even only as needed), please bring them with you on the day of your procedure.  May start Fiber Con once daily.   Thank you for choosing me and [facility].  Dr. Rush Landmark

## 2019-07-25 NOTE — Progress Notes (Signed)
[place] VISIT   Primary Care Provider [name], MD [address] [place] [address] [phone]  Referring Provider Wyatt Portela, MD [address] [place],  [place] [address] [phone]  Patient Profile: Lexie Morini [place] is a 74 y.o. male with a pmh significant for CAD (status post CABG), CHF, diverticulosis, GERD, hypertension, hyperlipidemia, chronic renal insufficiency, nephrolithiasis.  The patient presents to the [facility] for an evaluation and management of problem(s) noted below:  Problem List 1. Abnormal MRI, pelvis   2. Abnormal CT scan, pelvis   3. Rectal bleeding   4. Gastroesophageal reflux disease, unspecified whether esophagitis present   5. Hemorrhoids, unspecified hemorrhoid type   6. Constipation, unspecified constipation type     History of Present Illness This is the patient's first visit to the outpatient [facility] clinic.  In March 2020 the patient underwent cross-sectional imaging with a CT abdomen/pelvis for right-sided flank pain.  At the time nephrolithiasis was found but also a possible lymph node that was presacral was noted.  This was followed up 3 months later with a repeat CT abdomen pelvis without contrast in June 2020.  This showed a stable 10 x 15 mm soft tissue density in the presacral space with query differential of lymphadenopathy or soft tissue neoplasm.  This was followed up with a oncology/hematology visit where a MRI pelvis without contrast was then ordered for August 2020.  That exam showed a unchanged presacral soft tissue nodule from the previous CTs.  He also was found to have prostatomegaly and diverticulosis.  The patient was referred to have a colonoscopy but never scheduled this.  The patient was then referred by his providers back to GI for consideration of rule out colon cancer.  Today, the patient comes in for first evaluation in the [facility] outpatient clinic.   He states that he has had acid reflux and heartburn for over 30 years with very, very infrequent dysphagia.  If he eats very quickly he has an issue but otherwise does not have problems.  He has previously been on PPI therapy but transitioned off to an H2RA.  The patient denies any odynophagia.  The patient had an endoscopy per his report greater than 20 years ago.  The patient states he has had a colonoscopy but it has been many years potentially done by Dr. Lyla Son but we do not have any records of this in the system.  The patient has had hemorrhoids in the past and has used Preparation H on an as-needed basis with good effect.  He has had some issues with recurrent anal/rectal bleeding occurring every other day on the toilet paper sometimes a little bit more.  He has not used Preparation H recently.  He does strain to have bowel movements at times but has a bowel movement on a daily basis.  He has been tested per his report for colon cancer screening via Cologuard or FIT or occult blood testing for years and had one within the last 2 years.  There is no family history of GI malignancies.    GI Review of Systems Positive as above Negative for nausea, vomiting, pain, melena  Review of Systems General: Denies fevers/chills/weight loss HEENT: Denies oral lesions Cardiovascular: Denies chest pain/palpitations Pulmonary: Denies shortness of breath/nocturnal cough Gastroenterological: See HPI Genitourinary: Denies darkened urine or hematuria Hematological: Denies easy bruising/bleeding Endocrine: Denies temperature intolerance Dermatological: Denies jaundice Psychological: Mood is stable   Medications Current Outpatient Medications  Medication Sig Dispense Refill  . aspirin EC 81 MG tablet Take 1 tablet (81 mg total) by mouth daily. 90 tablet 3  . atenolol (TENORMIN) 50 MG tablet TAKE 1 TABLET BY MOUTH EVERY DAY 90 tablet 2  . calcium carbonate (OSCAL) 1500 (600 Ca) MG TABS tablet Take by  mouth daily.    . clotrimazole-betamethasone (LOTRISONE) cream Apply 1 application topically 2 (two) times daily. (Patient taking differently: Apply 1 application topically as needed. ) 30 g 0  . famotidine (PEPCID) 20 MG tablet Take 1 tablet (20 mg total) by mouth 2 (two) times daily. 60 tablet 5  . fluticasone (FLONASE) 50 MCG/ACT nasal spray INSTILL 2 SPRAYS INTO EACH NOSTRIL EVERY DAY 48 g 2  . Magnesium Oxide 400 MG CAPS Take 1 capsule (400 mg total) by mouth 2 (two) times daily. 60 capsule 0  . Omega-3 Fatty Acids (OMEGA 3 PO) Take 520 mg by mouth daily.     . rosuvastatin (CRESTOR) 20 MG tablet TAKE 1 TABLET BY MOUTH EVERY DAY 90 tablet 2  . valsartan (DIOVAN) 160 MG tablet TAKE 1 TABLET BY MOUTH ONE TIME DAILY. 90 tablet 2  . vitamin B-12 (CYANOCOBALAMIN) 1000 MCG tablet Take 1,000 mcg by mouth daily.    . Na Sulfate-K Sulfate-Mg Sulf (SUPREP BOWEL PREP KIT) 17.5-3.13-1.6 GM/177ML SOLN Take 1 kit by mouth as directed. For colonoscopy prep 354 mL 0   No current facility-administered medications for this visit.    Allergies No Known Allergies  Histories Past Medical History:  Diagnosis Date  . Broken back   . CAD (coronary artery disease)    PCI & CABG  . CHF (congestive heart failure) ([place])   . Diverticulitis    mild - approx 2004  . Family history of heart disease   . GERD (gastroesophageal reflux disease)   . History of nuclear stress test 06/30/2011   lexiscan; normal pattern of perfusion post-stress; low risk scan   . Hyperlipidemia   . Hypertension   . IgA nephropathy   . Irregular heart beat   . Systemic hypertension    Past Surgical History:  Procedure Laterality Date  . CARDIAC CATHETERIZATION  08/1995   PTCA of OM (Dr. Marella Chimes)  . CORONARY ARTERY BYPASS GRAFT  01/2000   x5 - LIMA to LAD, SVG to diagonal, sequential SVG to ramus & OM, SVG to PDA (Dr. Tharon Aquas Trigt)  . LUNG LOBECTOMY     left upper lobe  . TRANSTHORACIC ECHOCARDIOGRAM  12/24/2012   EF  55-60%, mild LVH, mild conc hypertrophy; mild MR; LA mildly dilated   Social History   Socioeconomic History  . Marital status: Single    Spouse name: n/a  . Number of children: 0  . Years of education: Not on file  . Highest education level: Not on file  Occupational History  . Occupation: Chief Strategy Officer, [employer] representative  Tobacco Use  . Smoking status: Never Smoker  . Smokeless tobacco: Never Used  Substance and Sexual Activity  . Alcohol use: Not Currently  . Drug use: No  . Sexual activity: Not on file  Other Topics Concern  . Not on file  Social History Narrative   Lives alone.   Sister-in-Law lives next door.   Social Determinants of Health   Financial Resource Strain:   . Difficulty of Paying Living Expenses: Not on file  Food Insecurity:   . Worried About Charity fundraiser in the Last Year: Not on file  . Ran  Out of Food in the Last Year: Not on file  Transportation Needs:   . Lack of Transportation (Medical): Not on file  . Lack of Transportation (Non-Medical): Not on file  Physical Activity:   . Days of Exercise per Week: Not on file  . Minutes of Exercise per Session: Not on file  Stress:   . Feeling of Stress : Not on file  Social Connections:   . Frequency of Communication with Friends and Family: Not on file  . Frequency of Social Gatherings with Friends and Family: Not on file  . Attends Religious Services: Not on file  . Active Member of Clubs or Organizations: Not on file  . Attends Archivist Meetings: Not on file  . Marital Status: Not on file  Intimate Partner Violence:   . Fear of Current or Ex-Partner: Not on file  . Emotionally Abused: Not on file  . Physically Abused: Not on file  . Sexually Abused: Not on file   Family History  Problem Relation Age of Onset  . Heart attack Father   . Kidney failure Brother   . Prostate cancer Brother   . Heart attack Brother   . Heart disease Brother   . Hypertension Brother   . Heart  disease Brother   . Hypertension Brother   . Colon cancer Neg Hx   . Esophageal cancer Neg Hx   . Rectal cancer Neg Hx   . Stomach cancer Neg Hx   . Inflammatory bowel disease Neg Hx   . Liver disease Neg Hx   . Pancreatic cancer Neg Hx    I have reviewed his medical, social, and family history in detail and updated the electronic medical record as necessary.    PHYSICAL EXAMINATION  BP 118/70   Pulse (!) 59   Temp (!) 96.8 F (36 C)   Ht 5' 8.5" (1.74 m)   Wt 171 lb (77.6 kg)   BMI 25.62 kg/m  Wt Readings from Last 3 Encounters:  07/25/19 171 lb (77.6 kg)  03/18/19 172 lb (78 kg)  12/20/18 170 lb 4.8 oz (77.2 kg)  GEN: NAD, appears stated age, doesn't appear chronically ill PSYCH: Cooperative, without pressured speech EYE: Conjunctivae pink, sclerae anicteric ENT: MMM CV: Nontachycardic RESP: No wheezing present GI: NABS, soft, mildly protuberant abdomen, nontender, without rebound or guarding  MSK/EXT: Bilateral pedal edema present SKIN: No jaundice NEURO:  Alert & Oriented x 3, no focal deficits   REVIEW OF DATA  I reviewed the following data at the time of this encounter:  GI Procedures and Studies  No relevant studies to review  Laboratory Studies  Reviewed those in epic  Imaging Studies  August 2020 MRI pelvis without contrast IMPRESSION: 1. The presacral soft tissue nodule is unchanged from the recent CTs and is not further characterized by this noncontrast study. It is new from 2014. There is an additional smaller presacral nodule more superiorly. These are indeterminate and potentially nodal metastases. Consider tissue sampling and colonoscopy if not recently performed. 2. No acute pelvic findings. 3. Diffuse sigmoid diverticulosis. 4. Moderate enlargement of the prostate gland.  June 2020 CT abdomen pelvis without contrast IMPRESSION: 1. Stable 10 x 15 mm soft tissue density in left presacral space. Differential diagnosis includes  lymphadenopathy, or primary soft tissue neoplasms such as of neurogenic or mesenchymal origin. Consider further imaging evaluation with pelvic MRI without and with contrast, or tissue sampling. 2. Stable enlarged prostate. 3. Colonic diverticulosis. No radiographic evidence of  diverticulitis. 4. Cholelithiasis, without radiographic evidence of cholecystitis. 5. Tiny nonobstructive bilateral renal calculi. No evidence of ureteral calculi or hydronephrosis. 6. Stable small hiatal hernia.  March 2020 CT abdomen pelvis without contrast IMPRESSION: 1. Bilateral tiny nonobstructing renal stones. No ureteral or bladder stones. No secondary changes in either kidney or ureter. 2. 9 mm short axis perirectal/presacral lymph node, new since 03/09/2016. This is an abnormal finding and correlation with colorectal cancer screening history recommended. Consider close follow-up CT in 3 months to ensure stability. 3. 3 mm right lung nodule, incompletely visualized. Consider dedicated CT chest without contrast for full assessment of this nodule. 4. Cholelithiasis. 5.  Aortic Atherosclerosis (ICD10-170.0)   ASSESSMENT  Mr. Staver is a 74 y.o. male with a pmh significant for CAD (status post CABG), CHF, diverticulosis, GERD, hypertension, hyperlipidemia, chronic renal insufficiency, nephrolithiasis.  The patient is seen today for evaluation and management of:  1. Abnormal MRI, pelvis   2. Abnormal CT scan, pelvis   3. Rectal bleeding   4. Gastroesophageal reflux disease, unspecified whether esophagitis present   5. Hemorrhoids, unspecified hemorrhoid type   6. Constipation, unspecified constipation type    The patient is hemodynamically and clinically stable.  Back in 2020 he had imaging findings concerning for a potential presacral lymph node versus soft tissue lesion.  He was referred for a colonoscopy but never scheduled.  He is referred now for further evaluation.  Based on the patient's clinical  history and asymptomatic nature is not clear to me that this will end up being a colorectal cancer associated lymph node with that being said we are happy to look forward to getting colon cancer screening.  He has a history of longstanding heartburn and needs to be screened for Barrett's and endoscopy will be performed as well.  Patient has had some rectal bleeding on and off over the course of the last few years which has been minor in nature and most likely hemorrhoidal based on his previous notation history but he has not had a colonoscopy in many years.  We will plan to evaluate this further with colonoscopy.  The patient will be initiated on FiberCon daily.  He has been given toileting instructions in an effort of trying to decrease straining while trying to have a bowel movement.  If the work-up from a luminal perspective is unremarkable then I would recommend that the patient follow-up with Dr. Should and consider repeat imaging and MRI pelvis as had been previously discussed.  If there remains issues thereafter and then we can reevaluate the updated imaging and see if a rectal endoscopic ultrasound would guide potential biopsy or if the biopsy of the area can be performed by interventional radiology.  The risks and benefits of endoscopic evaluation were discussed with the patient; these include but are not limited to the risk of perforation, infection, bleeding, missed lesions, lack of diagnosis, severe illness requiring hospitalization, as well as anesthesia and sedation related illnesses.  The patient is agreeable to proceed.  All patient questions were answered, to the best of my ability, and the patient agrees to the aforementioned plan of action with follow-up as indicated.   PLAN  Okay to continue H2 RA but consider PPI in future Diagnostic endoscopy to screen for Barrett's in the setting of longstanding heartburn Colonoscopy to get patient up-to-date for colon cancer screening and evaluate for  rectal bleeding as well has rule out colorectal cancer If unremarkable work-up then consider repeat pelvic MRI per Dr. Alen Blew and  then interventional radiology versus reconsideration for possible rectal endoscopic ultrasound biopsy should imaging suggests there is those a lesion that could be evaluated FiberCon daily Toileting instructions in effort of trying to decrease straining Increase fluid intake to 64 to 80 ounces of liquid per day   Orders Placed This Encounter  Procedures  . Ambulatory referral to Gastroenterology    New Prescriptions   NA SULFATE-K SULFATE-MG SULF (SUPREP BOWEL PREP KIT) 17.5-3.13-1.6 GM/177ML SOLN    Take 1 kit by mouth as directed. For colonoscopy prep   Modified Medications   No medications on file    Planned Follow Up No follow-ups on file.   Total Time in Face-to-Face and in Coordination of Care for patient including independent/personal interpretation/review of prior testing, medical history, examination, medication adjustment, communicating results with the patient directly, and documentation with the EHR is 55 minutes.   Justice Britain, MD [facility] Advanced Endoscopy Office # [phone]

## 2019-07-26 ENCOUNTER — Encounter: Payer: Self-pay | Admitting: Gastroenterology

## 2019-07-26 DIAGNOSIS — R935 Abnormal findings on diagnostic imaging of other abdominal regions, including retroperitoneum: Secondary | ICD-10-CM | POA: Insufficient documentation

## 2019-07-26 DIAGNOSIS — K5900 Constipation, unspecified: Secondary | ICD-10-CM | POA: Insufficient documentation

## 2019-07-26 DIAGNOSIS — K625 Hemorrhage of anus and rectum: Secondary | ICD-10-CM | POA: Insufficient documentation

## 2019-07-26 DIAGNOSIS — K649 Unspecified hemorrhoids: Secondary | ICD-10-CM | POA: Insufficient documentation

## 2019-08-13 ENCOUNTER — Other Ambulatory Visit: Payer: Self-pay

## 2019-08-13 ENCOUNTER — Encounter: Payer: Self-pay | Admitting: Gastroenterology

## 2019-08-13 ENCOUNTER — Ambulatory Visit (AMBULATORY_SURGERY_CENTER): Payer: Medicare Other | Admitting: Gastroenterology

## 2019-08-13 DIAGNOSIS — C20 Malignant neoplasm of rectum: Secondary | ICD-10-CM | POA: Diagnosis not present

## 2019-08-13 DIAGNOSIS — K5900 Constipation, unspecified: Secondary | ICD-10-CM | POA: Diagnosis not present

## 2019-08-13 DIAGNOSIS — K219 Gastro-esophageal reflux disease without esophagitis: Secondary | ICD-10-CM

## 2019-08-13 DIAGNOSIS — D120 Benign neoplasm of cecum: Secondary | ICD-10-CM | POA: Diagnosis not present

## 2019-08-13 DIAGNOSIS — D122 Benign neoplasm of ascending colon: Secondary | ICD-10-CM

## 2019-08-13 DIAGNOSIS — K269 Duodenal ulcer, unspecified as acute or chronic, without hemorrhage or perforation: Secondary | ICD-10-CM | POA: Diagnosis not present

## 2019-08-13 DIAGNOSIS — K625 Hemorrhage of anus and rectum: Secondary | ICD-10-CM | POA: Diagnosis not present

## 2019-08-13 DIAGNOSIS — K5710 Diverticulosis of small intestine without perforation or abscess without bleeding: Secondary | ICD-10-CM | POA: Diagnosis not present

## 2019-08-13 DIAGNOSIS — K6289 Other specified diseases of anus and rectum: Secondary | ICD-10-CM

## 2019-08-13 DIAGNOSIS — R935 Abnormal findings on diagnostic imaging of other abdominal regions, including retroperitoneum: Secondary | ICD-10-CM

## 2019-08-13 DIAGNOSIS — K209 Esophagitis, unspecified without bleeding: Secondary | ICD-10-CM

## 2019-08-13 DIAGNOSIS — K259 Gastric ulcer, unspecified as acute or chronic, without hemorrhage or perforation: Secondary | ICD-10-CM | POA: Diagnosis not present

## 2019-08-13 DIAGNOSIS — K2970 Gastritis, unspecified, without bleeding: Secondary | ICD-10-CM | POA: Diagnosis not present

## 2019-08-13 DIAGNOSIS — K2980 Duodenitis without bleeding: Secondary | ICD-10-CM

## 2019-08-13 DIAGNOSIS — K317 Polyp of stomach and duodenum: Secondary | ICD-10-CM

## 2019-08-13 MED ORDER — OMEPRAZOLE 40 MG PO CPDR: 40.0000 mg | DELAYED_RELEASE_CAPSULE | Freq: Two times a day (BID) | ORAL | 3 refills | Status: DC

## 2019-08-13 MED ORDER — SODIUM CHLORIDE 0.9 % IV SOLN: 500.0000 mL | Freq: Once | INTRAVENOUS | Status: DC

## 2019-08-13 NOTE — Progress Notes (Signed)
Pt's states no medical or surgical changes since previsit or office visit. [place]

## 2019-08-13 NOTE — Op Note (Signed)
[facility] Patient Name: Jared White Procedure Date: 08/13/2019 2:13 PM MRN: [id number] Endoscopist: Justice Britain , MD Age: 74 Referring MD:  Date of Birth: September 13, 1945 Gender: Male Account #: [id number] Procedure:                Colonoscopy Indications:              Screening for colorectal malignant neoplasm, This                            is the patient's first colonoscopy, Incidental -                            Abnormal MRI of the GI tract with concern for                            potential lymph node or lesion in pelvic region Medicines:                Monitored Anesthesia Care Procedure:                Pre-Anesthesia Assessment:                           - Prior to the procedure, a History and Physical                            was performed, and patient medications and                            allergies were reviewed. The patient's tolerance of                            previous anesthesia was also reviewed. The risks                            and benefits of the procedure and the sedation                            options and risks were discussed with the patient.                            All questions were answered, and informed consent                            was obtained. Prior Anticoagulants: The patient has                            taken no previous anticoagulant or antiplatelet                            agents. ASA Grade Assessment: II - A patient with                            mild systemic disease. After reviewing the risks  and benefits, the patient was deemed in                            satisfactory condition to undergo the procedure.                           After obtaining informed consent, the colonoscope                            was passed under direct vision. Throughout the                            procedure, the patient's blood pressure, pulse, and                            oxygen saturations  were monitored continuously. The                            Colonoscope was introduced through the anus and                            advanced to the the cecum, identified by                            appendiceal orifice and ileocecal valve. The                            colonoscopy was performed without difficulty. The                            patient tolerated the procedure. The quality of the                            bowel preparation was adequate. The ileocecal                            valve, appendiceal orifice, and rectum were                            photographed. Scope In: 2:38:45 PM Scope Out: 3:16:59 PM Scope Withdrawal Time: 0 hours 33 minutes 18 seconds  Total Procedure Duration: 0 hours 38 minutes 14 seconds  Findings:                 The digital rectal exam findings include palpable                            rectal mass.                           Four sessile polyps were found in the ascending                            colon (2) and cecum (2). The polyps were 3 to 14 mm  in size. These polyps were removed with a cold                            snare. Resection and retrieval were complete. To                            prevent bleeding after the polypectomy, two                            hemostatic clips were successfully placed (MR                            conditional) on one of the polyp sites in the [facility]                            that continued to ooze for >5 minutes post                            resection. There was no bleeding at the end of the                            procedure.                           Many small and large-mouthed diverticula were found                            in the entire colon.                           A fungating, infiltrative, polypoid and ulcerated                            partially obstructing large mass was found from                            anus to rectum (0-8 cm). The mass was partially                             circumferential (involving one-third of the lumen                            circumference). The mass measured eight cm in                            length. Oozing was present. Biopsies were taken                            with a cold forceps for histology to rule out                            malignancy.  Non-bleeding non-thrombosed external and internal                            hemorrhoids were found during retroflexion, during                            perianal exam and during digital exam. The                            hemorrhoids were Grade II (internal hemorrhoids                            that prolapse but reduce spontaneously). Complications:            No immediate complications. Estimated Blood Loss:     Estimated blood loss was minimal. Impression:               - Palpable rectal mass found on digital rectal exam.                           - Four 3 to 14 mm polyps in the ascending colon and                            in the cecum, removed with a cold snare. Resected                            and retrieved. Clips (MR conditional) were placed.                           - Diverticulosis in the entire examined colon.                           - Rule out malignancy, partially obstructing tumor                            from anus to rectum. Biopsied.                           - Non-bleeding non-thrombosed external and internal                            hemorrhoids. Recommendation:           - The patient will be observed post-procedure,                            until all discharge criteria are met.                           - Discharge patient to home.                           - Patient has a contact number available for                            emergencies. The signs  and symptoms of potential                            delayed complications were discussed with the                            patient. Return to normal  activities tomorrow.                            Written discharge instructions were provided to the                            patient.                           - High fiber diet.                           - Continue present medications.                           - Await pathology results.                           - Repeat colonoscopy in 1 year for surveillance                            after therapy for rectal mass/lesion is                            performed/completed.                           - Await pathology results.                           - Will proceed with CT-Chest/Abdomen/Pelvis. Pelvic                            MRI will likely need to be considered as well for                            staging purposes.                           - Will need follow up with Oncology (Dr. Osker Mason). Justice Britain, MD 08/13/2019 3:38:14 PM

## 2019-08-13 NOTE — Progress Notes (Signed)
pt tolerated well. VSS. awake and to recovery. Report given to RN.  

## 2019-08-13 NOTE — Progress Notes (Signed)
Called to room to assist during endoscopic procedure.  Patient ID and intended procedure confirmed with present staff. Received instructions for my participation in the procedure from the performing physician.  

## 2019-08-13 NOTE — Patient Instructions (Signed)
Handouts on polyps, diverticulosis, hemorrhoids, high fiber diet, and gastritis given to you today  Await pathology results  Start omeprazole 40 mg twice a day    YOU HAD AN ENDOSCOPIC PROCEDURE TODAY AT [facility]:   Refer to the procedure report that was given to you for any specific questions about what was found during the examination.  If the procedure report does not answer your questions, please call your gastroenterologist to clarify.  If you requested that your care partner not be given the details of your procedure findings, then the procedure report has been included in a sealed envelope for you to review at your convenience later.  YOU SHOULD EXPECT: Some feelings of bloating in the abdomen. Passage of more gas than usual.  Walking can help get rid of the air that was put into your GI tract during the procedure and reduce the bloating. If you had a lower endoscopy (such as a colonoscopy or flexible sigmoidoscopy) you may notice spotting of blood in your stool or on the toilet paper. If you underwent a bowel prep for your procedure, you may not have a normal bowel movement for a few days.  Please Note:  You might notice some irritation and congestion in your nose or some drainage.  This is from the oxygen used during your procedure.  There is no need for concern and it should clear up in a day or so.  SYMPTOMS TO REPORT IMMEDIATELY:   Following lower endoscopy (colonoscopy or flexible sigmoidoscopy):  Excessive amounts of blood in the stool  Significant tenderness or worsening of abdominal pains  Swelling of the abdomen that is new, acute  Fever of 100F or higher   Following upper endoscopy (EGD)  Vomiting of blood or coffee ground material  New chest pain or pain under the shoulder blades  Painful or persistently difficult swallowing  New shortness of breath  Fever of 100F or higher  Black, tarry-looking stools  For urgent or emergent issues, a  gastroenterologist can be reached at any hour by calling [phone]. Do not use MyChart messaging for urgent concerns.    DIET:  We do recommend a small meal at first, but then you may proceed to your regular diet.  Drink plenty of fluids but you should avoid alcoholic beverages for 24 hours.  ACTIVITY:  You should plan to take it easy for the rest of today and you should NOT DRIVE or use heavy machinery until tomorrow (because of the sedation medicines used during the test).    FOLLOW UP: Our staff will call the number listed on your records 48-72 hours following your procedure to check on you and address any questions or concerns that you may have regarding the information given to you following your procedure. If we do not reach you, we will leave a message.  We will attempt to reach you two times.  During this call, we will ask if you have developed any symptoms of COVID 19. If you develop any symptoms (ie: fever, flu-like symptoms, shortness of breath, cough etc.) before then, please call [phone].  If you test positive for Covid 19 in the 2 weeks post procedure, please call and report this information to [place].    If any biopsies were taken you will be contacted by phone or by letter within the next 1-3 weeks.  Please call us at [phone] if you have not heard about the biopsies in 3 weeks.    SIGNATURES/CONFIDENTIALITY: You  and/or your care partner have signed paperwork which will be entered into your electronic medical record.  These signatures attest to the fact that that the information above on your After Visit Summary has been reviewed and is understood.  Full responsibility of the confidentiality of this discharge information lies with you and/or your care-partner.

## 2019-08-13 NOTE — Op Note (Signed)
[facility] Patient Name: Jared White Procedure Date: 08/13/2019 2:14 PM MRN: [id number] Endoscopist: Justice Britain , MD Age: 74 Referring MD:  Date of Birth: 1946/01/11 Gender: Male Account #: [id number] Procedure:                Upper GI endoscopy Indications:              Heartburn, Gastro-esophageal reflux disease,                            Screening for Barrett's esophagus in patient at                            risk for this condition Medicines:                Monitored Anesthesia Care Procedure:                Pre-Anesthesia Assessment:                           - Prior to the procedure, a History and Physical                            was performed, and patient medications and                            allergies were reviewed. The patient's tolerance of                            previous anesthesia was also reviewed. The risks                            and benefits of the procedure and the sedation                            options and risks were discussed with the patient.                            All questions were answered, and informed consent                            was obtained. Prior Anticoagulants: The patient has                            taken no previous anticoagulant or antiplatelet                            agents. ASA Grade Assessment: II - A patient with                            mild systemic disease. After reviewing the risks                            and benefits, the patient was deemed in  satisfactory condition to undergo the procedure.                           After obtaining informed consent, the endoscope was                            passed under direct vision. Throughout the                            procedure, the patient's blood pressure, pulse, and                            oxygen saturations were monitored continuously. The                            Endoscope was introduced through the  mouth, and                            advanced to the second part of duodenum. The upper                            GI endoscopy was accomplished without difficulty.                            The patient tolerated the procedure. Scope In: Scope Out: Findings:                 No gross lesions were noted in the proximal                            esophagus and in the mid esophagus.                           Scattered islands of salmon-colored mucosa were                            present from 35 to 38 cm. No other visible                            abnormalities were present. The maximum                            longitudinal extent of these esophageal mucosal                            changes was 3 cm in length. Biopsies were taken                            with a cold forceps for histology to rule in/out                            Barrett's.                           Localized mild mucosal changes  characterized by                            congestion and granularity were found at the                            gastroesophageal junction. Biopsies were taken with                            a cold forceps for histology.                           A 3 cm hiatal hernia was present.                           A single 13 mm sessile polyp with no bleeding and                            no stigmata of recent bleeding was found in the                            cardia/hiatal hernia. This was biopsied with a cold                            forceps for histology.                           Hematin (altered blood/coffee-ground-like material)                            was found in the cardia, in the gastric fundus and                            in the gastric body. This lavaged away showing the                            following.                           Segmental and patchy moderate inflammation                            characterized by erosions, erythema and granularity                             was found in the gastric body, at the incisura and                            in the gastric antrum. Biopsies were taken with a                            cold forceps for histology and Helicobacter pylori  testing.                           Patchy mildly erythematous mucosa without active                            bleeding and with no stigmata of bleeding was found                            in the duodenal bulb, in the first portion of the                            duodenum and in the second portion of the duodenum.                            Biopsies for histology were taken with a cold                            forceps for evaluation of celiac disease and HP.                           A medium non-bleeding diverticulum was found in the                            second portion of the duodenum. Complications:            No immediate complications. Estimated Blood Loss:     Estimated blood loss was minimal. Impression:               - No gross lesions in esophagus proximally.                           - Salmon-colored mucosa suspicious for                            short-segment Barrett's esophagus distally.                            Biopsied.                           - Congested, granular mucosa at the [employer].                            Biopsied.                           - 3 cm hiatal hernia.                           - A single gastric polyp in hiatal hernia. Biopsied.                           - Hematin (altered blood/coffee-ground-like  material) in the cardia, in the gastric fundus and                            in the gastric body.                           - Gastritis. Biopsied.                           - Erythematous duodenopathy. Biopsied.                           - Non-bleeding duodenal diverticulum in distal                            D2/D3 region. Recommendation:           - Proceed to scheduled  colonoscopy.                           - Await pathology results.                           - Start patient on Omeprazole 40 mg twice daily for                            next few months.                           - Will need to discuss with patient EGD with EMR of                            the Gastric Cardia polyp at some point in the                            future in the hospital-based outpatient setting.                           - Repeat EGD in next few months to evaluate for                            healing of the GI tract.                           - The findings and recommendations were discussed                            with the patient.                           - The findings and recommendations were discussed                            with the patient's family. Justice Britain, MD 08/13/2019 3:30:47 PM

## 2019-08-14 ENCOUNTER — Encounter: Payer: Self-pay | Admitting: Gastroenterology

## 2019-08-15 ENCOUNTER — Telehealth: Payer: Self-pay | Admitting: Oncology

## 2019-08-15 ENCOUNTER — Other Ambulatory Visit: Payer: Self-pay

## 2019-08-15 ENCOUNTER — Telehealth: Payer: Self-pay

## 2019-08-15 DIAGNOSIS — C20 Malignant neoplasm of rectum: Secondary | ICD-10-CM

## 2019-08-15 NOTE — Telephone Encounter (Signed)
Scheduled appt per 3/25 sch msg. Called and spoke with patient. Confirmed appt

## 2019-08-15 NOTE — Telephone Encounter (Signed)
  Follow up Call-  Call back number 08/13/2019  Post procedure Call Back phone  # [phone]  Permission to leave phone message Yes  Some recent data might be hidden     Patient questions:  Do you have a fever, pain , or abdominal swelling? No. Pain Score  0 *  Have you tolerated food without any problems? Yes.    Have you been able to return to your normal activities? Yes.    Do you have any questions about your discharge instructions: Diet   No. Medications  No. Follow up visit  No.  Do you have questions or concerns about your Care? No.  Actions: * If pain score is 4 or above: No action needed, pain <4.  1. Have you developed a fever since your procedure? no  2.   Have you had an respiratory symptoms (SOB or cough) since your procedure? no  3.   Have you tested positive for COVID 19 since your procedure no  4.   Have you had any family members/close contacts diagnosed with the COVID 19 since your procedure?  no   If yes to any of these questions please route to Joylene John, RN and Erenest Rasher, RN

## 2019-08-16 ENCOUNTER — Other Ambulatory Visit (INDEPENDENT_AMBULATORY_CARE_PROVIDER_SITE_OTHER): Payer: Medicare Other

## 2019-08-16 DIAGNOSIS — C20 Malignant neoplasm of rectum: Secondary | ICD-10-CM | POA: Diagnosis not present

## 2019-08-16 LAB — BUN: BUN: 23 mg/dL (ref 6–23)

## 2019-08-16 LAB — CREATININE, SERUM: Creatinine, Ser: 1.54 mg/dL — ABNORMAL HIGH (ref 0.40–1.50)

## 2019-08-19 ENCOUNTER — Other Ambulatory Visit: Payer: Self-pay

## 2019-08-19 ENCOUNTER — Inpatient Hospital Stay: Payer: Medicare Other | Attending: Oncology | Admitting: Oncology

## 2019-08-19 ENCOUNTER — Encounter: Payer: Self-pay | Admitting: Oncology

## 2019-08-19 VITALS — BP 172/84 | HR 63 | Temp 97.9°F | Resp 18 | Wt 171.5 lb

## 2019-08-19 DIAGNOSIS — R140 Abdominal distension (gaseous): Secondary | ICD-10-CM | POA: Diagnosis not present

## 2019-08-19 DIAGNOSIS — C20 Malignant neoplasm of rectum: Secondary | ICD-10-CM | POA: Insufficient documentation

## 2019-08-19 DIAGNOSIS — R1907 Generalized intra-abdominal and pelvic swelling, mass and lump: Secondary | ICD-10-CM

## 2019-08-19 DIAGNOSIS — Z79899 Other long term (current) drug therapy: Secondary | ICD-10-CM | POA: Diagnosis not present

## 2019-08-19 DIAGNOSIS — G629 Polyneuropathy, unspecified: Secondary | ICD-10-CM | POA: Insufficient documentation

## 2019-08-19 DIAGNOSIS — C7800 Secondary malignant neoplasm of unspecified lung: Secondary | ICD-10-CM | POA: Insufficient documentation

## 2019-08-19 DIAGNOSIS — R590 Localized enlarged lymph nodes: Secondary | ICD-10-CM | POA: Diagnosis not present

## 2019-08-19 HISTORY — DX: Malignant neoplasm of rectum: C20

## 2019-08-19 MED ORDER — LIDOCAINE-PRILOCAINE 2.5-2.5 % EX CREA: 1.0000 "application " | TOPICAL_CREAM | CUTANEOUS | 0 refills | Status: DC | PRN

## 2019-08-19 MED ORDER — PROCHLORPERAZINE MALEATE 10 MG PO TABS: 10.0000 mg | ORAL_TABLET | Freq: Four times a day (QID) | ORAL | 0 refills | Status: DC | PRN

## 2019-08-19 NOTE — Progress Notes (Signed)
Hematology and Oncology Follow Up Visit  Jared White [id number] Jan 16, 1946 74 y.o. 08/19/2019 8:18 AM Jared White Jared White, MDGreene, [name], MD   Principle Diagnosis: 74 year old man with rectal cancer diagnosed in March 2021.  He presented with pelvic adenopathy with colonoscopy showed a rectal mass.   Prior Therapy: He is status post colonoscopy and endoscopy completed on August 13, 2019.  Rectal biopsy showed an invasive adenocarcinoma.  Current therapy: Under evaluation to start therapy.  Interim History: [name] presents today for a follow-up visit.  Since the last visit, he underwent a colonoscopy and endoscopy with the colonoscopy showed obstructing large mass between 0 and 8 cm and is partially circumferential involving one third of the luminal circumference.  This was a biopsy-proven to be adenocarcinoma.  Clinically, he feels reasonably fair at this time.  He does report some abdominal bloating no nausea, vomiting or hematochezia.  He still able to move his bowels although without any major difficulties.  Denies any diarrhea, or distention.  Continues to have reasonable performance status and activity level.     Medications: I have reviewed the patient's current medications.  Current Outpatient Medications  Medication Sig Dispense Refill  . aspirin EC 81 MG tablet Take 1 tablet (81 mg total) by mouth daily. 90 tablet 3  . atenolol (TENORMIN) 50 MG tablet TAKE 1 TABLET BY MOUTH EVERY DAY 90 tablet 2  . calcium carbonate (OSCAL) 1500 (600 Ca) MG TABS tablet Take by mouth daily.    . clotrimazole-betamethasone (LOTRISONE) cream Apply 1 application topically 2 (two) times daily. (Patient not taking: Reported on 08/13/2019) 30 g 0  . famotidine (PEPCID) 20 MG tablet Take 1 tablet (20 mg total) by mouth 2 (two) times daily. 60 tablet 5  . fluticasone (FLONASE) 50 MCG/ACT nasal spray INSTILL 2 SPRAYS INTO EACH NOSTRIL EVERY DAY (Patient not taking: Reported on 08/13/2019) 48 g 2  .  Magnesium Oxide 400 MG CAPS Take 1 capsule (400 mg total) by mouth 2 (two) times daily. 60 capsule 0  . Omega-3 Fatty Acids (OMEGA 3 PO) Take 520 mg by mouth daily.     Marland Kitchen omeprazole (PRILOSEC) 40 MG capsule Take 1 capsule (40 mg total) by mouth 2 (two) times daily. 90 capsule 3  . rosuvastatin (CRESTOR) 20 MG tablet TAKE 1 TABLET BY MOUTH EVERY DAY 90 tablet 2  . valsartan (DIOVAN) 160 MG tablet TAKE 1 TABLET BY MOUTH ONE TIME DAILY. 90 tablet 2  . vitamin B-12 (CYANOCOBALAMIN) 1000 MCG tablet Take 1,000 mcg by mouth daily.     No current facility-administered medications for this visit.     Allergies: No Known Allergies    Physical Exam: Blood pressure (!) 172/84, pulse 63, temperature 97.9 F (36.6 C), temperature source Temporal, resp. rate 18, weight 171 lb 8 oz (77.8 kg), SpO2 100 %.   ECOG: 1 General appearance: alert and cooperative appeared without distress. Head: Normocephalic, without obvious abnormality Oropharynx: No oral thrush or ulcers. Eyes: No scleral icterus.  Pupils are equal and round reactive to light. Lymph nodes: Cervical, supraclavicular, and axillary nodes normal. Heart:regular rate and rhythm, S1, S2 normal, no murmur, click, rub or gallop Lung:chest clear, no wheezing, rales, normal symmetric air entry Abdomin: soft, non-tender, without masses or organomegaly. Neurological: No motor, sensory deficits.  Intact deep tendon reflexes. Skin: No rashes or lesions.  No ecchymosis or petechiae. Musculoskeletal: No joint deformity or effusion.     Lab Results: Lab Results  Component Value Date  WBC 8.8 07/14/2017   HGB 13.7 07/14/2017   HCT 41.0 07/14/2017   MCV 92 07/14/2017   PLT 169 07/14/2017     Chemistry      Component Value Date/Time   NA 140 12/09/2016 0932   K 5.2 12/09/2016 0932   CL 105 12/09/2016 0932   CO2 21 12/09/2016 0932   BUN 23 08/16/2019 0913   BUN 29 (H) 12/09/2016 0932   CREATININE 1.54 (H) 08/16/2019 0913   CREATININE  1.61 (H) 01/21/2016 1030      Component Value Date/Time   CALCIUM 9.6 12/09/2016 0932   CALCIUM 7.0 (L) 10/09/2015 0458   ALKPHOS 55 01/21/2016 1030   AST 14 01/21/2016 1030   ALT 19 01/21/2016 1030   BILITOT 0.3 01/21/2016 1030      Impression and Plan:  74 year old man with:  1.  Rectal cancer confirmed in March 2021.  He was found to have a large mass between 0 and 8 cm from the anal verge with clinical stage III with pelvic lymph nodes.  This was biopsy-proven to be adenocarcinoma.  The mass is nearly obstructing at this time.    The natural course of this disease and treatment options were reviewed.  These options would include preoperative radiation therapy concomitantly with 5-FU based treatment versus neoadjuvant chemotherapy followed by chemo RT as a part of a total therapy approach.  The rationale for all these options were reviewed.  Potential complications associated with both approaches were discussed.  In all likelihood he will require both treatments at this time given the locally advanced nature of his disease.  And the odds are that he will require surgical management as well.  Complication associated with FOLFOX chemotherapy was reviewed.  These include nausea, vomiting, myelosuppression, neutropenia as well as peripheral neuropathy.  The plan is to complete his staging work-up and his case to be discussed in the multidisciplinary GI tumor board and tentatively start therapy in the near future.  I will make arrangements to start FOLFOX chemotherapy in the near future.  Chemo education class will be arranged for him.  2.  IV access risks and benefits of Port-A-Cath insertion was reviewed today.  Complications include bleeding, thrombosis and infection.  He is agreeable to proceed.  3.  Antiemetics: Prescription for Compazine will be made available to him.  4.  Neuropathy: Does not have any residual neuropathy will monitor closely on oxaliplatin based therapy.  5.   Follow-up: Will be in the near future to start therapy.  30  minutes were dedicated to this visit. The time was spent on reviewing laboratory data, imaging studies, discussing treatment options, and answering questions regarding future plan.    [name], MD 3/29/20218:18 AM

## 2019-08-19 NOTE — Progress Notes (Signed)
START ON PATHWAY REGIMEN - Colorectal     A cycle is every 14 days:     Oxaliplatin      Leucovorin      Fluorouracil      Fluorouracil   **Always confirm dose/schedule in your pharmacy ordering system**  Patient Characteristics: Preoperative or Nonsurgical Candidate (Clinical Staging), Rectal, cT3 - cT4, cN0 or Any cT, cN+ Tumor Location: Rectal Therapeutic Status: Preoperative or Nonsurgical Candidate (Clinical Staging) AJCC T Category: cT4 AJCC N Category: cN1a AJCC M Category: cM0 AJCC 8 Stage Grouping: Unknown Intent of Therapy: Curative Intent, Discussed with Patient

## 2019-08-20 ENCOUNTER — Telehealth: Payer: Self-pay | Admitting: Oncology

## 2019-08-20 NOTE — Telephone Encounter (Signed)
Scheduled appt per 3/29 los.  Spoke with pt and he is aware of his scheduled appt dates and time.

## 2019-08-22 ENCOUNTER — Ambulatory Visit (HOSPITAL_COMMUNITY)
Admission: RE | Admit: 2019-08-22 | Discharge: 2019-08-22 | Disposition: A | Discharge status: Home / Self Care | Payer: Medicare Other | Source: Ambulatory Visit | Attending: Gastroenterology | Admitting: Gastroenterology

## 2019-08-22 ENCOUNTER — Encounter (HOSPITAL_COMMUNITY): Payer: Self-pay

## 2019-08-22 ENCOUNTER — Other Ambulatory Visit: Payer: Self-pay

## 2019-08-22 DIAGNOSIS — C20 Malignant neoplasm of rectum: Secondary | ICD-10-CM | POA: Insufficient documentation

## 2019-08-22 DIAGNOSIS — C189 Malignant neoplasm of colon, unspecified: Secondary | ICD-10-CM | POA: Diagnosis not present

## 2019-08-22 IMAGING — CT CT CHEST W/ CM
2 of 5 series · 12 of 36 positions shown, 15 images · IV contrast (OMNIPAQUE)
Comparison: CT scan [DATE]

CLINICAL DATA: New diagnosis of colon cancer. Colonoscopy
[DATE]. Staging examination.

EXAM:
CT CHEST, ABDOMEN, AND PELVIS WITH CONTRAST
TECHNIQUE: Multidetector CT imaging of the chest, abdomen and pelvis was
performed following the standard protocol during bolus
administration of intravenous contrast.
CONTRAST:  100mL OMNIPAQUE IOHEXOL 300 MG/ML  SOLN

[Series 2: cap with · axial · 0.75mm/px · z∈[+1145,+1675]mm · 9 of 134 slices shown, 12 images]
[im 14/134  mediastinal]
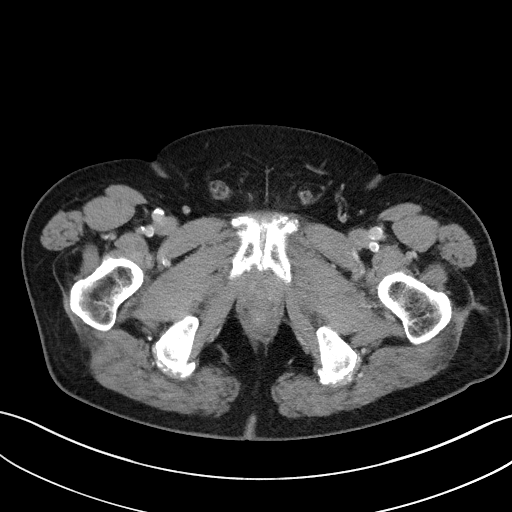
[im 14/134  lung]
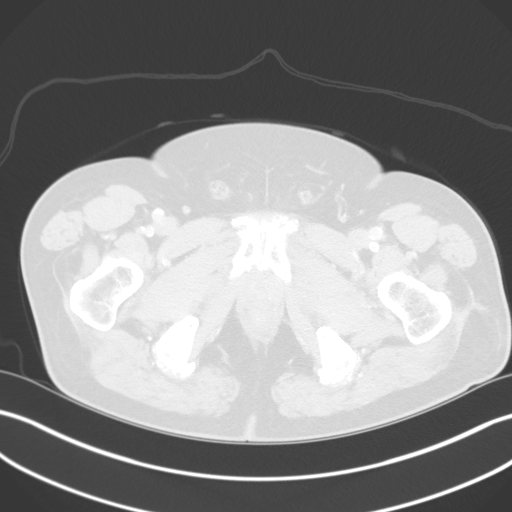
[im 27/134  lung]
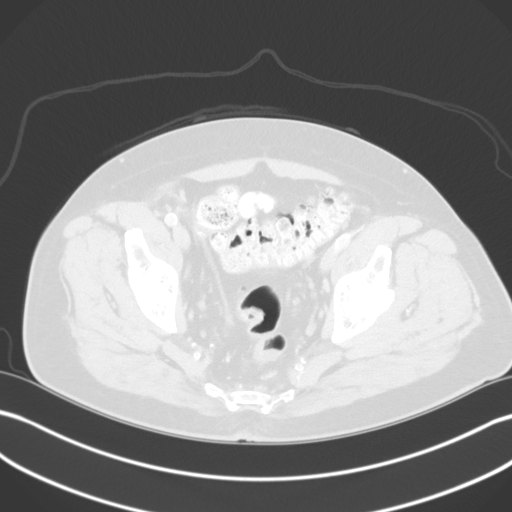
[im 40/134  lung]
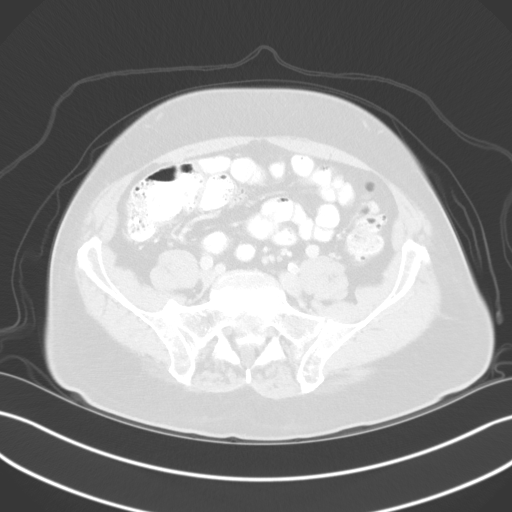
[im 54/134  lung]
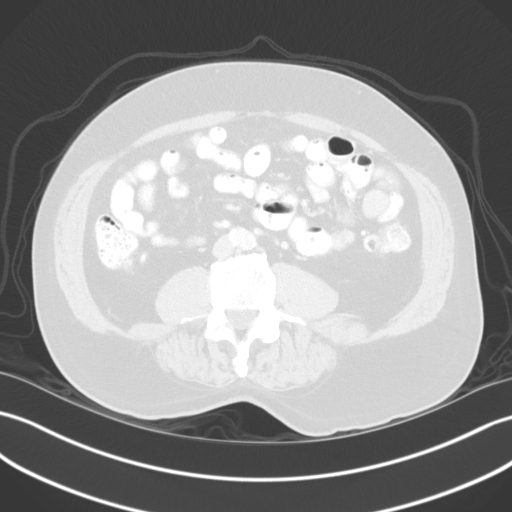
[im 67/134  mediastinal]
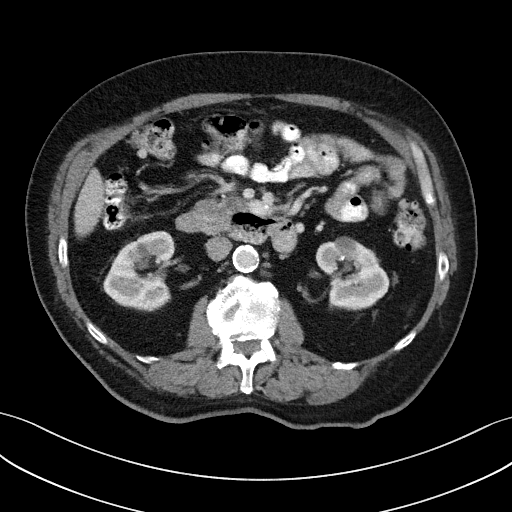
[im 67/134  lung]
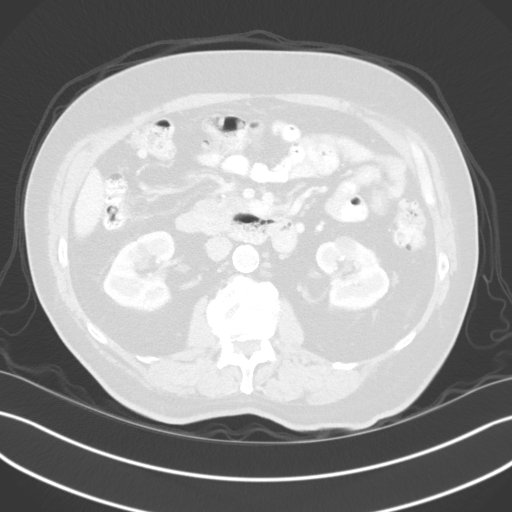
[im 80/134  lung]
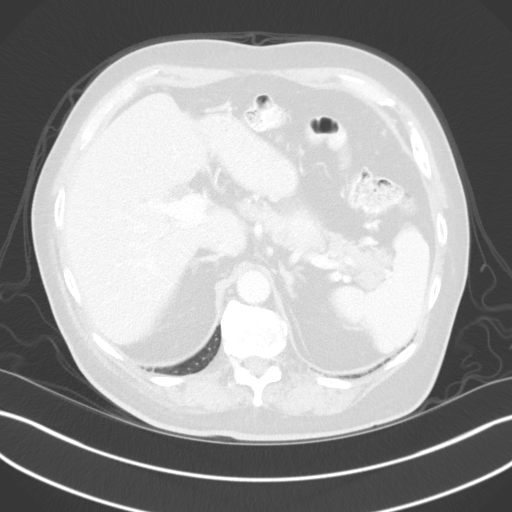
[im 94/134  lung]
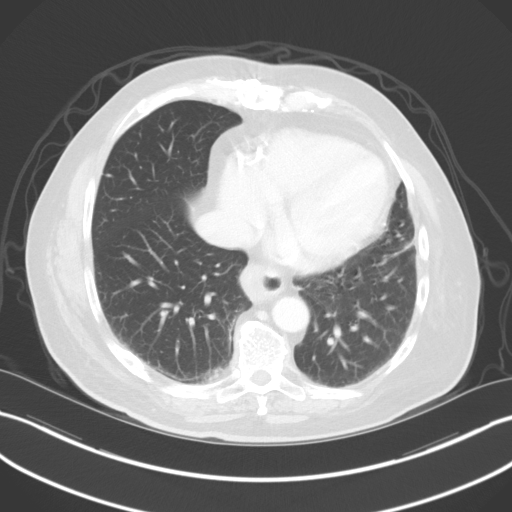
[im 107/134  lung]
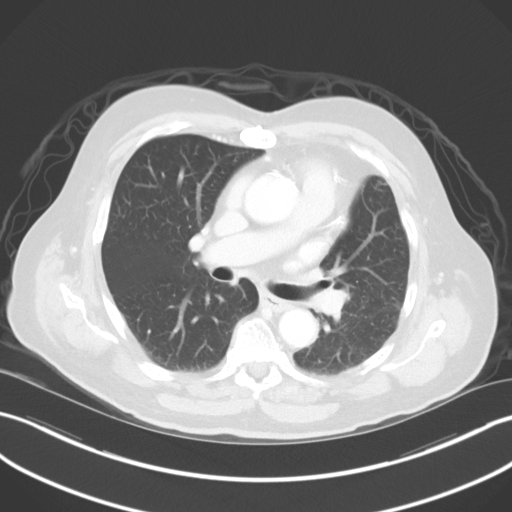
[im 120/134  mediastinal]
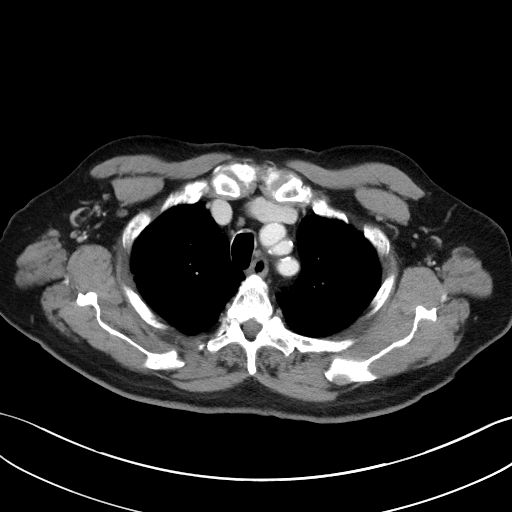
[im 120/134  lung]
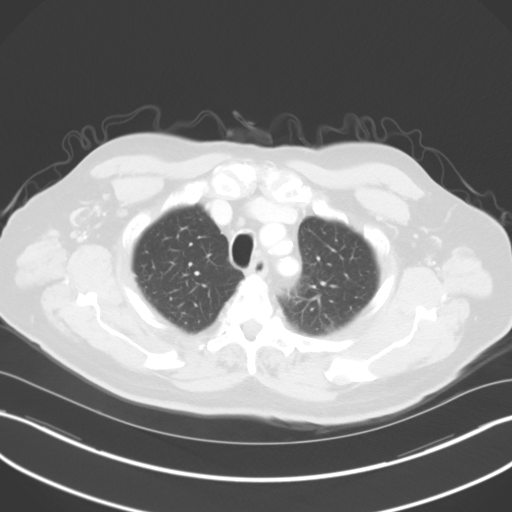

[Series 5: coronals · coronal · 0.82mm/px · 3 of 172 slices shown]
[im 35/172  lung]
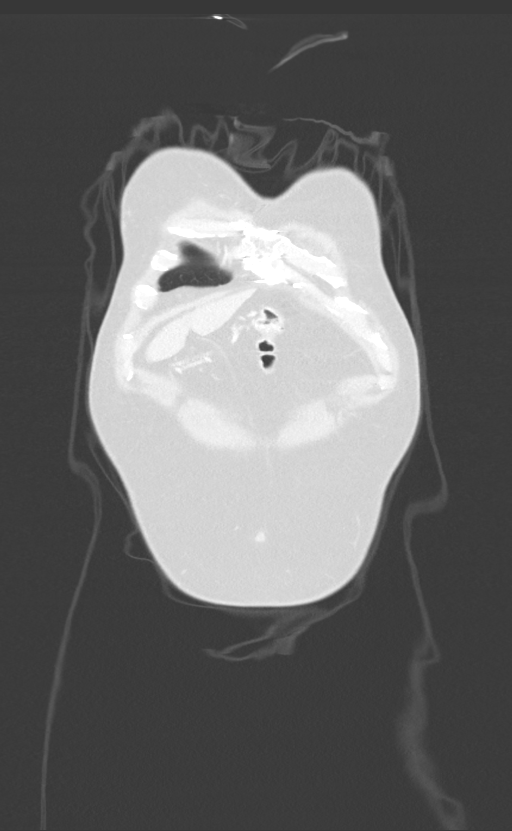
[im 69/172  lung]
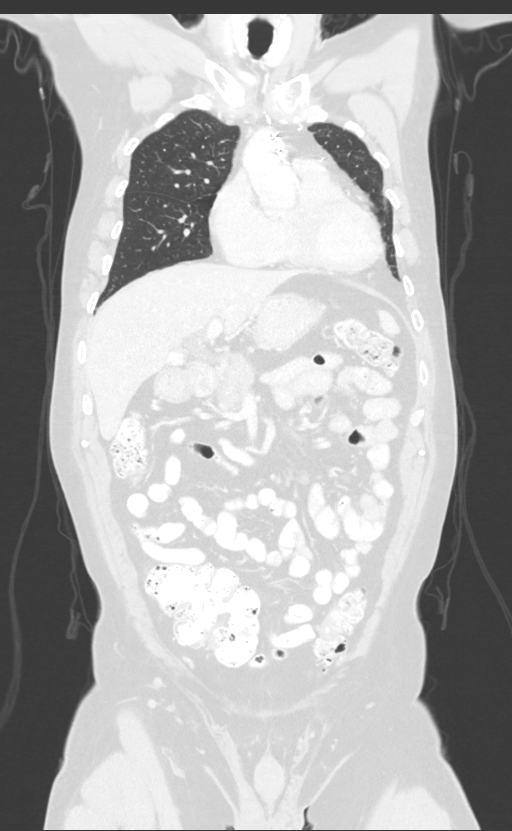
[im 103/172  lung]
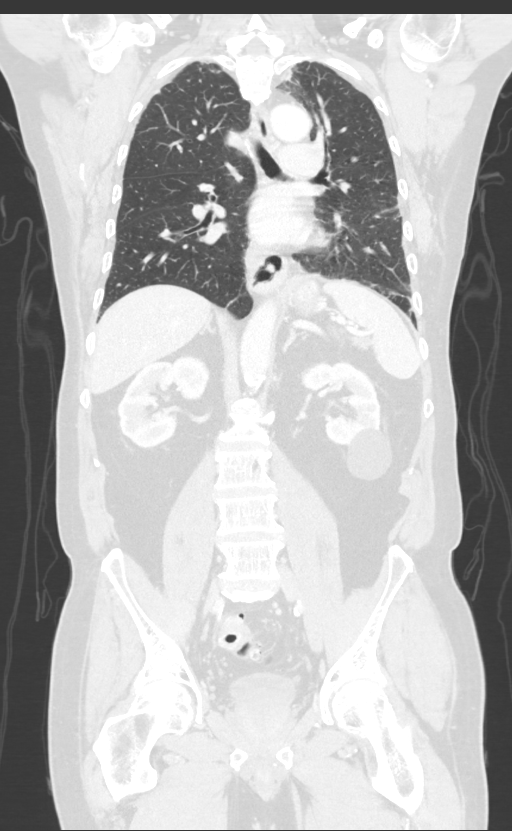

[12 of 36 positions shown; findings below may reference images not displayed]

FINDINGS: CT CHEST FINDINGS

Cardiovascular: The heart is within normal limits in size and
stable. No pericardial effusion. Surgical changes noted from
coronary artery bypass surgery. There are extensive three-vessel
coronary artery calcifications. Scattered aortic calcifications but
no aneurysm or dissection. The branch vessels are patent.

Mediastinum/Nodes: No mediastinal or hilar mass or adenopathy. Small
scattered sub 8 mm lymph nodes are noted. The esophagus is
unremarkable except for a small hiatal hernia.

Lungs/Pleura: Mild interstitial scarring changes. No acute pulmonary
findings. No worrisome pulmonary nodules to suggest metastatic
disease. No pleural effusion or pleural nodule. The tracheobronchial
tree is unremarkable.

Musculoskeletal: No chest wall mass, supraclavicular or axillary
adenopathy. The thyroid gland appears normal.

The bony thorax is intact. No bone lesions. Moderate degenerative
changes involving the thoracic spine.

CT ABDOMEN PELVIS FINDINGS

Hepatobiliary: No hepatic lesions are identified to suggest hepatic
metastatic disease. Calcified granuloma is noted in segment 8.
Numerous calcified gallstones are noted the gallbladder but no
findings for acute cholecystitis. No significant common bile duct
dilatation.

Pancreas: No mass, inflammation or ductal dilatation.

Spleen: Normal size. No focal lesions.

Adrenals/Urinary Tract: The adrenal glands are normal.

Mild renal cortical scarring type changes and small renal calculi
but no worrisome renal lesions or hydronephrosis. Bilateral renal
cysts appear stable.

The bladder is unremarkable.

Stomach/Bowel: The patient's rectal cancer is very difficult to
identify on the CT scan. But there is a worrisome 13 mm perirectal
lymph node on image [DATE]. This was present on the prior CT scan from
[DATE] measuring 9 mm. There also measured 9 mm on the CT scan
from [DATE]. I do not see any pelvic sidewall adenopathy or
enlarged lymph nodes in the sigmoid mesocolon.

The remainder of the colon appears unremarkable except for diffuse
diverticulosis. No acute inflammatory changes or obstructive
findings.

The stomach, duodenum and small bowel are unremarkable. No acute
inflammatory changes, mass lesions or obstructive findings. There is
a moderate-sized duodenal diverticulum involving the third portion.

Vascular/Lymphatic: Advanced atherosclerotic calcifications
involving the aorta and iliac arteries and branch vessels. No
aneurysm or dissection. The major venous structures are patent.

A few small scattered stable mesenteric and retroperitoneal lymph
nodes but no overt adenopathy. Largest left para-aortic node
measures 7 mm on image 76/2.

Enlarged perirectal lymph node as discussed above but no other
enlarged pelvic or inguinal nodes.

Reproductive: Mild prostate gland enlargement. The seminal vesicles
appear normal.

Other: No free pelvic fluid collections. No inguinal mass or hernia.

Musculoskeletal: No significant bony findings.
IMPRESSION: 1. The patient's rectal cancer is very difficult to identify on the
CT scan. But there is a worrisome 13 mm perirectal lymph node. No
other enlarged pelvic or abdominal lymph nodes are identified.
2. No findings for metastatic disease involving the lungs, liver or
bony structures.
3. Cholelithiasis.
4. Advanced atherosclerotic calcifications involving the thoracic
and abdominal aorta and branch vessels.

Aortic Atherosclerosis ([5T]-[5T]).

## 2019-08-22 IMAGING — CT CT ABD-PELV W/ CM
2 of 5 series · 12 of 36 positions shown, 15 images · IV contrast (OMNIPAQUE)
Comparison: CT scan [DATE]

CLINICAL DATA: New diagnosis of colon cancer. Colonoscopy
[DATE]. Staging examination.

EXAM:
CT CHEST, ABDOMEN, AND PELVIS WITH CONTRAST
TECHNIQUE: Multidetector CT imaging of the chest, abdomen and pelvis was
performed following the standard protocol during bolus
administration of intravenous contrast.
CONTRAST:  100mL OMNIPAQUE IOHEXOL 300 MG/ML  SOLN

[Series 2: cap with · axial · 0.75mm/px · z∈[+1145,+1675]mm · 9 of 134 slices shown, 12 images]
[im 14/134  mediastinal]
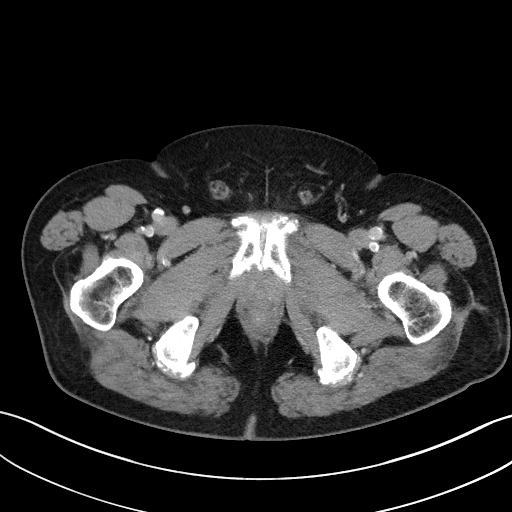
[im 14/134  lung]
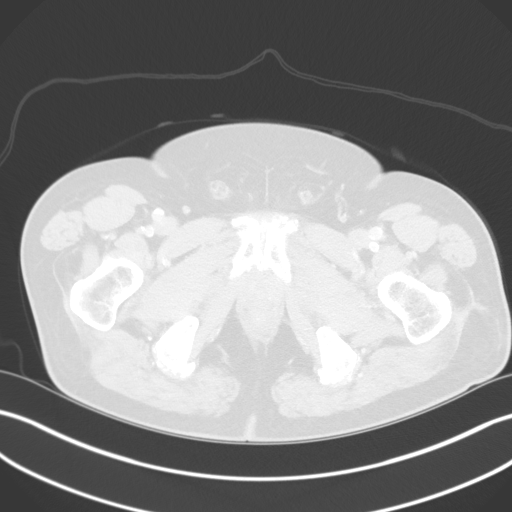
[im 27/134  lung]
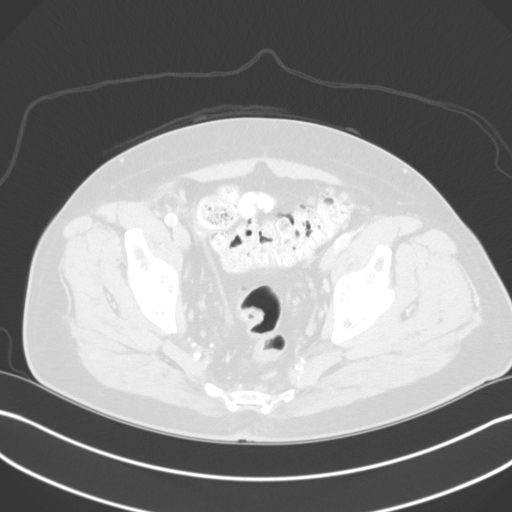
[im 40/134  lung]
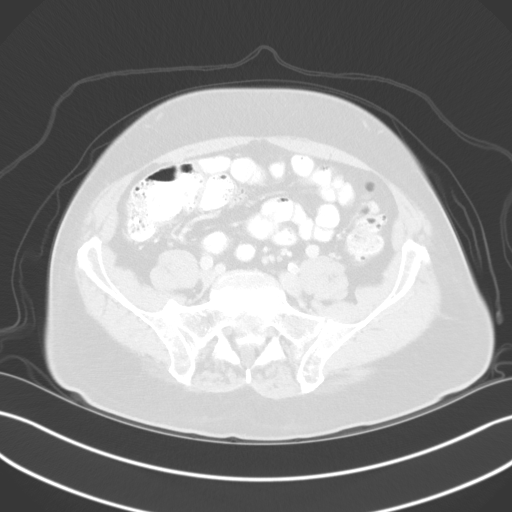
[im 54/134  lung]
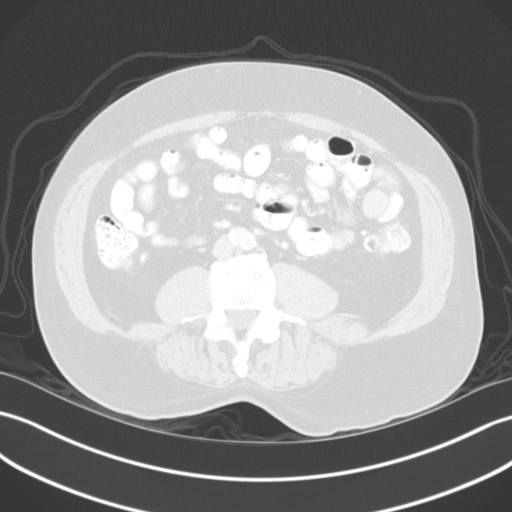
[im 67/134  mediastinal]
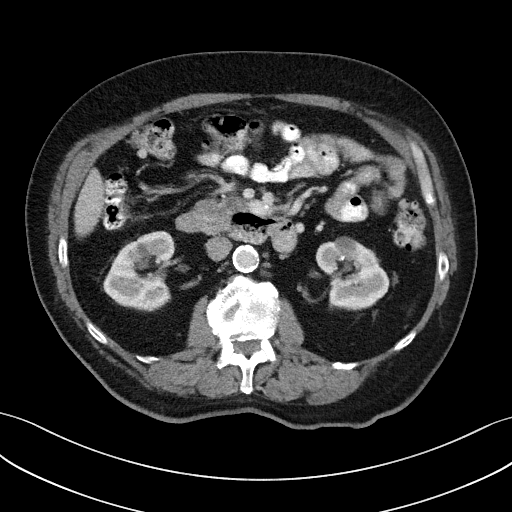
[im 67/134  lung]
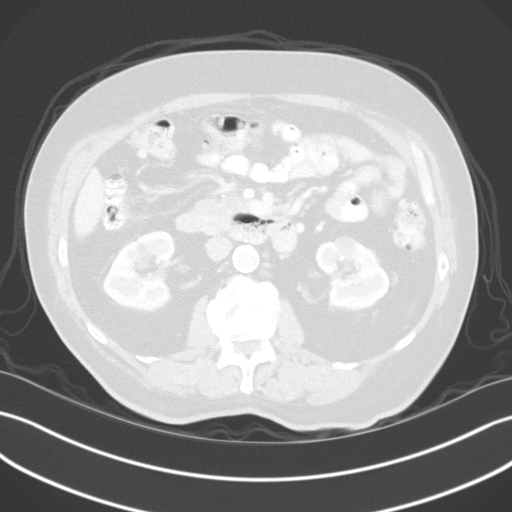
[im 80/134  lung]
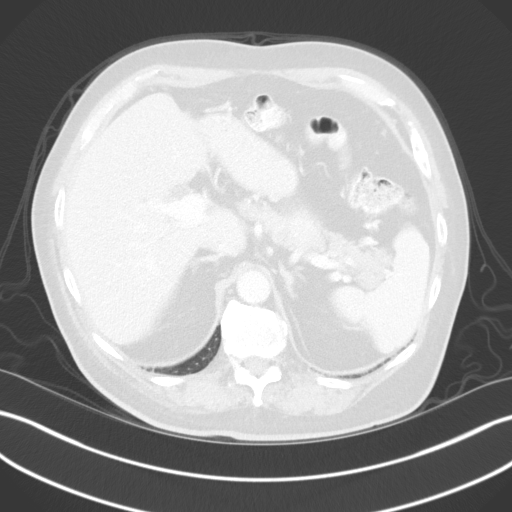
[im 94/134  lung]
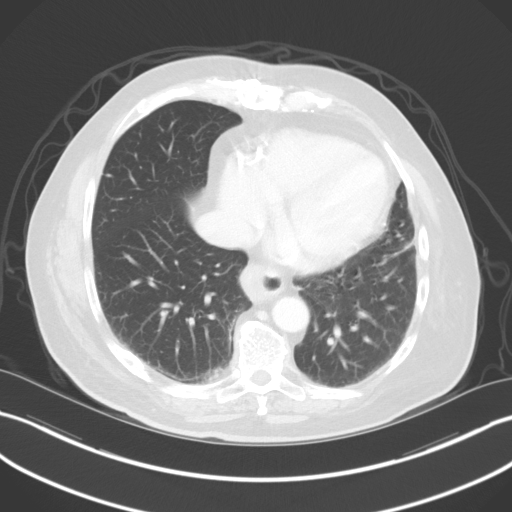
[im 107/134  lung]
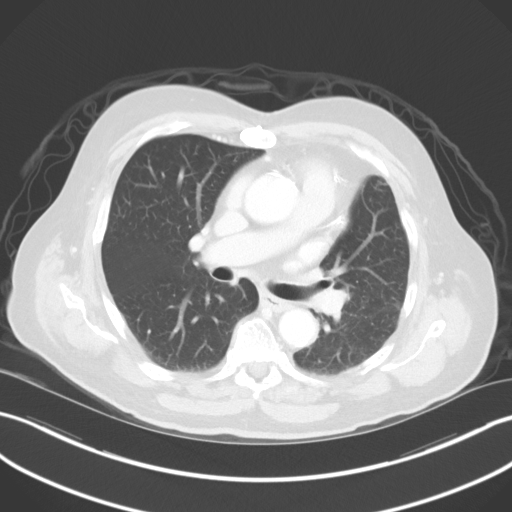
[im 120/134  mediastinal]
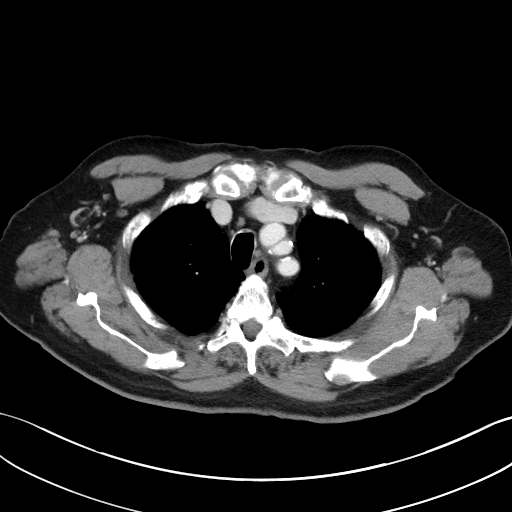
[im 120/134  lung]
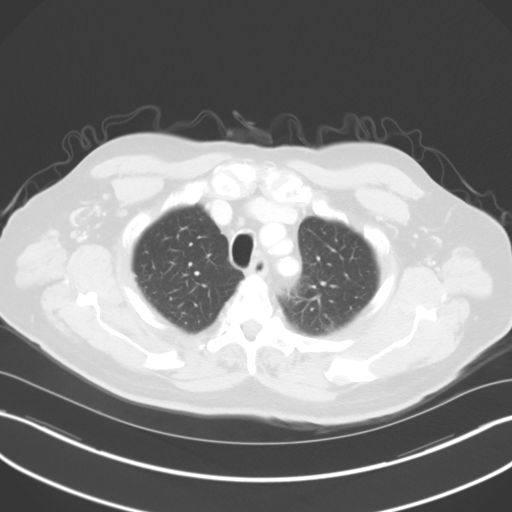

[Series 5: coronals · coronal · 0.82mm/px · 3 of 172 slices shown]
[im 35/172  lung]
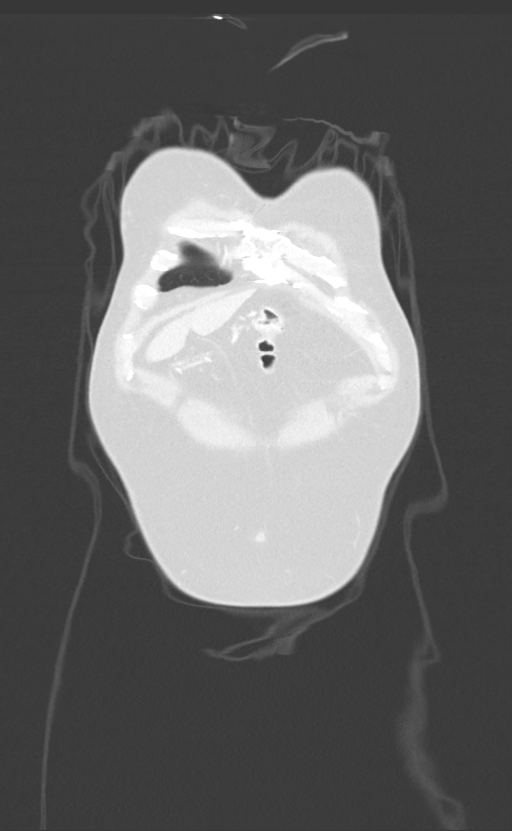
[im 69/172  lung]
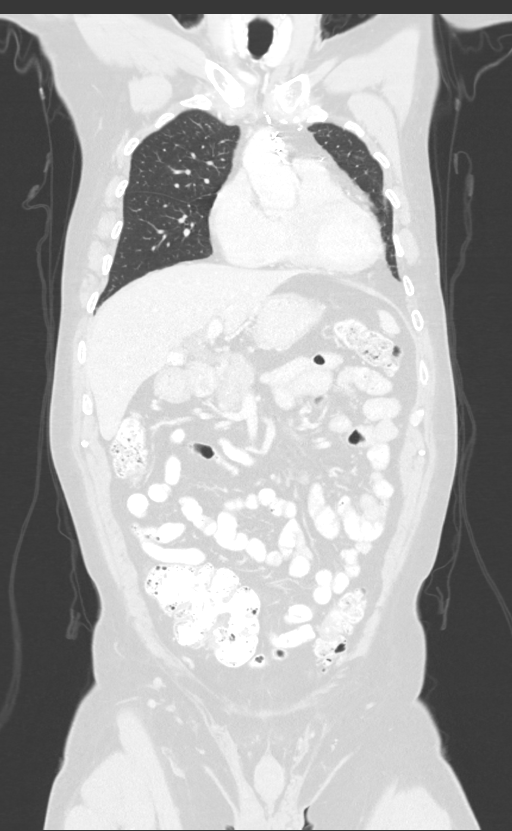
[im 103/172  lung]
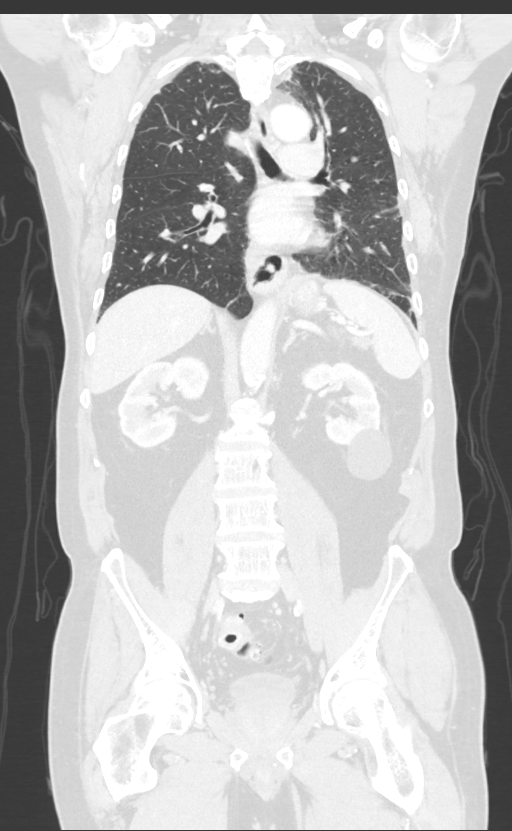

[12 of 36 positions shown; findings below may reference images not displayed]

FINDINGS: CT CHEST FINDINGS

Cardiovascular: The heart is within normal limits in size and
stable. No pericardial effusion. Surgical changes noted from
coronary artery bypass surgery. There are extensive three-vessel
coronary artery calcifications. Scattered aortic calcifications but
no aneurysm or dissection. The branch vessels are patent.

Mediastinum/Nodes: No mediastinal or hilar mass or adenopathy. Small
scattered sub 8 mm lymph nodes are noted. The esophagus is
unremarkable except for a small hiatal hernia.

Lungs/Pleura: Mild interstitial scarring changes. No acute pulmonary
findings. No worrisome pulmonary nodules to suggest metastatic
disease. No pleural effusion or pleural nodule. The tracheobronchial
tree is unremarkable.

Musculoskeletal: No chest wall mass, supraclavicular or axillary
adenopathy. The thyroid gland appears normal.

The bony thorax is intact. No bone lesions. Moderate degenerative
changes involving the thoracic spine.

CT ABDOMEN PELVIS FINDINGS

Hepatobiliary: No hepatic lesions are identified to suggest hepatic
metastatic disease. Calcified granuloma is noted in segment 8.
Numerous calcified gallstones are noted the gallbladder but no
findings for acute cholecystitis. No significant common bile duct
dilatation.

Pancreas: No mass, inflammation or ductal dilatation.

Spleen: Normal size. No focal lesions.

Adrenals/Urinary Tract: The adrenal glands are normal.

Mild renal cortical scarring type changes and small renal calculi
but no worrisome renal lesions or hydronephrosis. Bilateral renal
cysts appear stable.

The bladder is unremarkable.

Stomach/Bowel: The patient's rectal cancer is very difficult to
identify on the CT scan. But there is a worrisome 13 mm perirectal
lymph node on image [DATE]. This was present on the prior CT scan from
[DATE] measuring 9 mm. There also measured 9 mm on the CT scan
from [DATE]. I do not see any pelvic sidewall adenopathy or
enlarged lymph nodes in the sigmoid mesocolon.

The remainder of the colon appears unremarkable except for diffuse
diverticulosis. No acute inflammatory changes or obstructive
findings.

The stomach, duodenum and small bowel are unremarkable. No acute
inflammatory changes, mass lesions or obstructive findings. There is
a moderate-sized duodenal diverticulum involving the third portion.

Vascular/Lymphatic: Advanced atherosclerotic calcifications
involving the aorta and iliac arteries and branch vessels. No
aneurysm or dissection. The major venous structures are patent.

A few small scattered stable mesenteric and retroperitoneal lymph
nodes but no overt adenopathy. Largest left para-aortic node
measures 7 mm on image 76/2.

Enlarged perirectal lymph node as discussed above but no other
enlarged pelvic or inguinal nodes.

Reproductive: Mild prostate gland enlargement. The seminal vesicles
appear normal.

Other: No free pelvic fluid collections. No inguinal mass or hernia.

Musculoskeletal: No significant bony findings.
IMPRESSION: 1. The patient's rectal cancer is very difficult to identify on the
CT scan. But there is a worrisome 13 mm perirectal lymph node. No
other enlarged pelvic or abdominal lymph nodes are identified.
2. No findings for metastatic disease involving the lungs, liver or
bony structures.
3. Cholelithiasis.
4. Advanced atherosclerotic calcifications involving the thoracic
and abdominal aorta and branch vessels.

Aortic Atherosclerosis ([5T]-[5T]).

## 2019-08-22 MED ORDER — IOHEXOL 300 MG/ML  SOLN
100.0000 mL | Freq: Once | INTRAMUSCULAR | Status: AC | PRN
  Administered 2019-08-22: 100 mL via INTRAVENOUS

## 2019-08-22 MED ORDER — SODIUM CHLORIDE (PF) 0.9 % IJ SOLN
INTRAMUSCULAR | Status: AC
  Filled 2019-08-22: qty 50

## 2019-08-23 ENCOUNTER — Other Ambulatory Visit: Payer: Self-pay

## 2019-08-23 ENCOUNTER — Inpatient Hospital Stay: Payer: Medicare Other | Attending: Oncology

## 2019-08-23 DIAGNOSIS — C20 Malignant neoplasm of rectum: Secondary | ICD-10-CM | POA: Insufficient documentation

## 2019-08-23 DIAGNOSIS — G629 Polyneuropathy, unspecified: Secondary | ICD-10-CM | POA: Insufficient documentation

## 2019-08-23 DIAGNOSIS — R112 Nausea with vomiting, unspecified: Secondary | ICD-10-CM | POA: Insufficient documentation

## 2019-08-23 DIAGNOSIS — Z5111 Encounter for antineoplastic chemotherapy: Secondary | ICD-10-CM | POA: Insufficient documentation

## 2019-08-23 DIAGNOSIS — Z79899 Other long term (current) drug therapy: Secondary | ICD-10-CM | POA: Insufficient documentation

## 2019-08-26 ENCOUNTER — Other Ambulatory Visit: Payer: Self-pay | Admitting: Gastroenterology

## 2019-08-26 ENCOUNTER — Other Ambulatory Visit: Payer: Self-pay

## 2019-08-26 ENCOUNTER — Ambulatory Visit (HOSPITAL_COMMUNITY)
Admission: RE | Admit: 2019-08-26 | Discharge: 2019-08-26 | Disposition: A | Discharge status: Home / Self Care | Payer: Medicare Other | Source: Ambulatory Visit | Attending: Gastroenterology | Admitting: Gastroenterology

## 2019-08-26 DIAGNOSIS — C20 Malignant neoplasm of rectum: Secondary | ICD-10-CM | POA: Diagnosis not present

## 2019-08-26 IMAGING — MR MR PELVIS W/O CM
12 series · 48 of 48 positions shown · non-contrast
Comparison: CT abdomen/pelvis dated [DATE]

CLINICAL DATA: Rectal cancer staging

EXAM:
MRI PELVIS WITHOUT CONTRAST
TECHNIQUE: Multiplanar multisequence MR imaging of the pelvis was performed. No
intravenous contrast was administered. Small amount of US gel was
administered per rectum to optimize tumor evaluation.

[Series 2: T2 · axial · 5.0mm · 1.48mm/px · z∈[-147,+123]mm · 2 of 46 slices shown (1 of 7)]
[im 1/46]
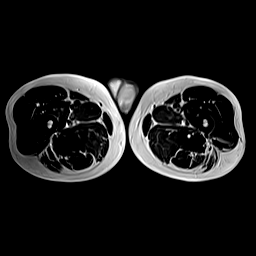
[im 46/46]
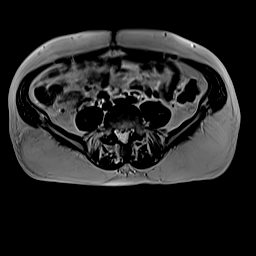

[Series 3: T2 · coronal · 4.0mm · 0.86mm/px · 4 of 47 slices shown (2 of 7)]
[im 1/47]
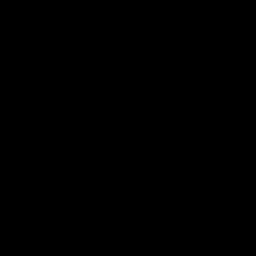
[im 16/47]
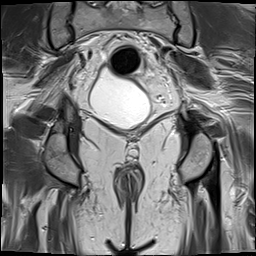
[im 31/47]
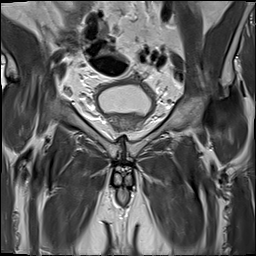
[im 47/47]
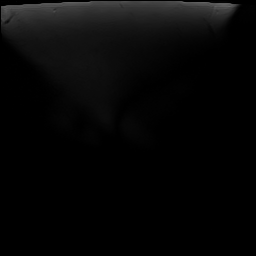

[Series 4: T2 · sagittal · 3.0mm · 0.43mm/px · 4 of 52 slices shown (3 of 7)]
[im 1/52]
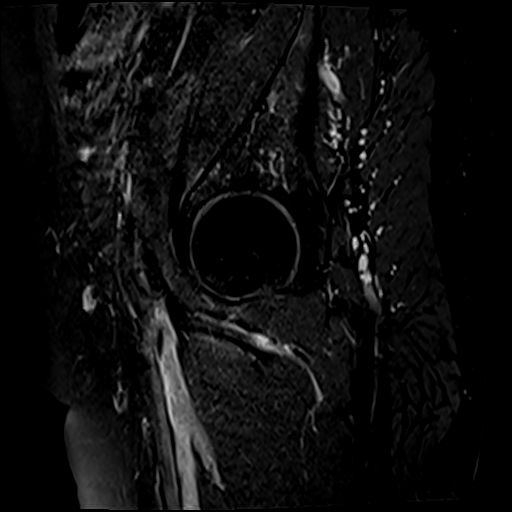
[im 18/52]
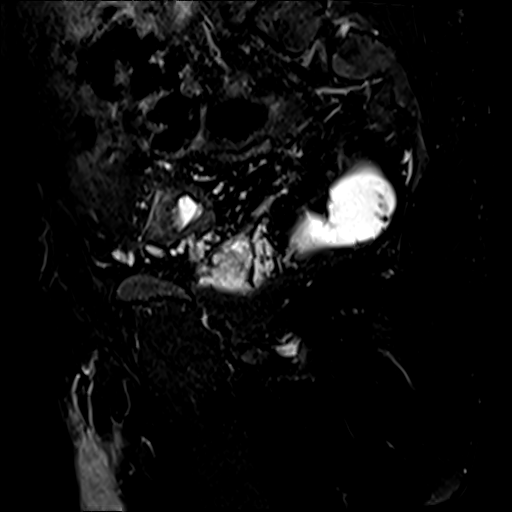
[im 35/52]
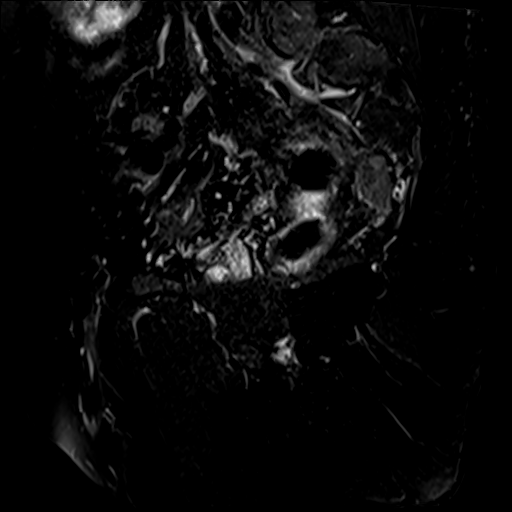
[im 52/52]
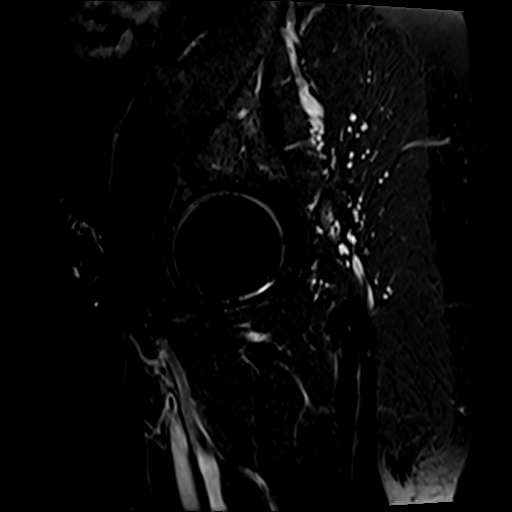

[Series 5: DWI · axial · 5.0mm · 1.48mm/px · z∈[-138,+96]mm · 6 of 79 slices shown (1 of 2)]
[im 1/79]
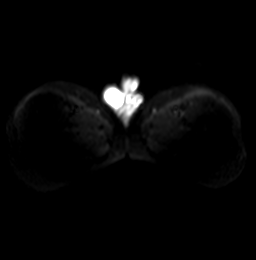
[im 16/79]
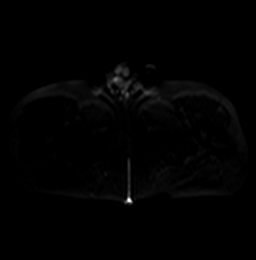
[im 32/79]
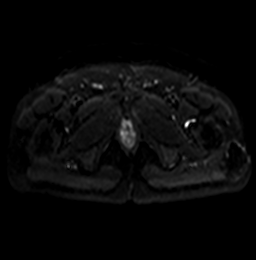
[im 47/79]
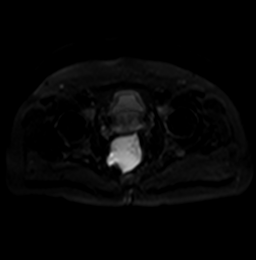
[im 63/79]
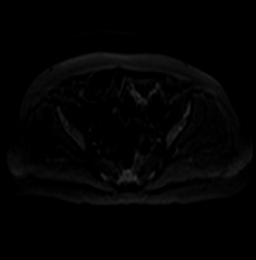
[im 79/79]
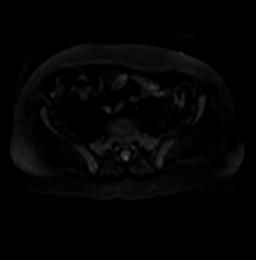

[Series 6: DWI · axial · 5.0mm · 1.48mm/px · z∈[-138,+96]mm · 3 of 40 slices shown (2 of 2)]
[im 1/40]
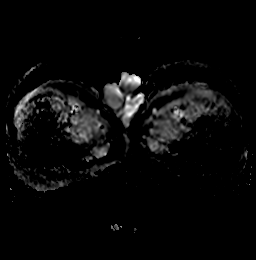
[im 20/40]
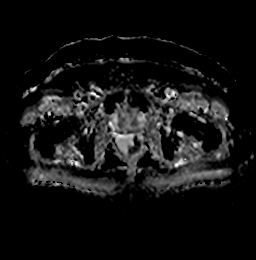
[im 40/40]
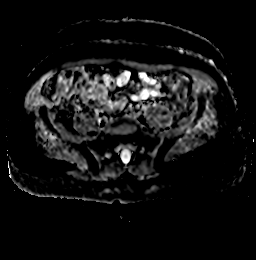

[Series 7: T2 · sagittal · 3.0mm · 0.43mm/px · 4 of 52 slices shown (4 of 7)]
[im 1/52]
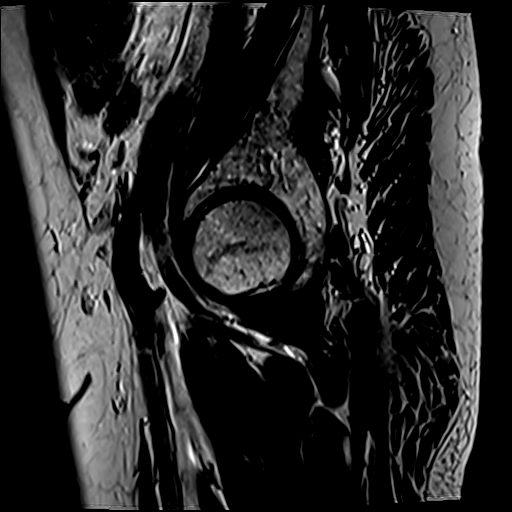
[im 18/52]
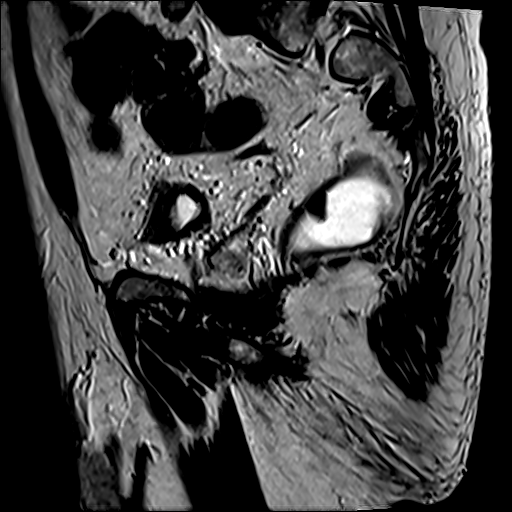
[im 35/52]
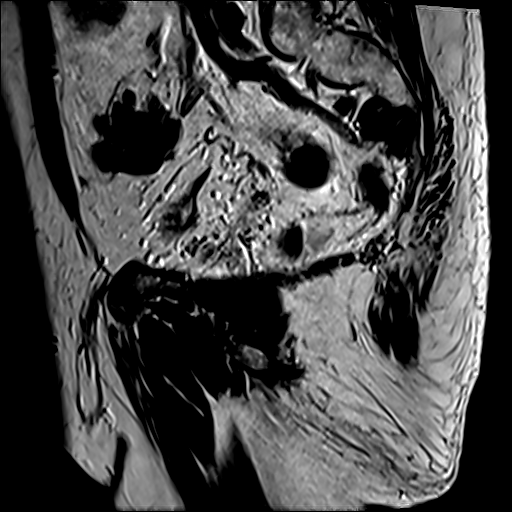
[im 52/52]
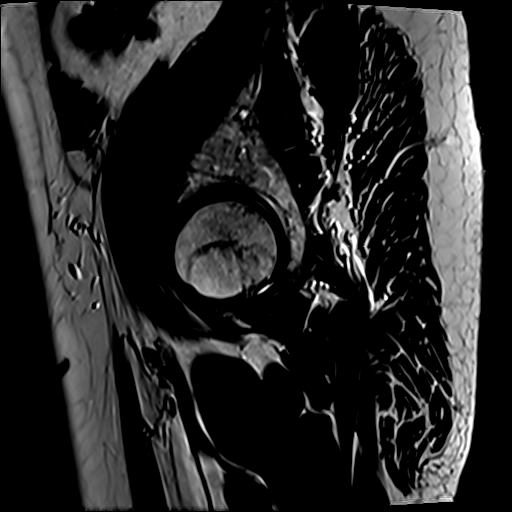

[Series 8: T2 · axial · 5.0mm · 1.48mm/px · z∈[-109,+197]mm · 4 of 52 slices shown (5 of 7)]
[im 1/52]
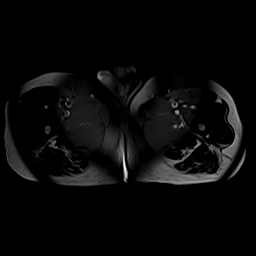
[im 18/52]
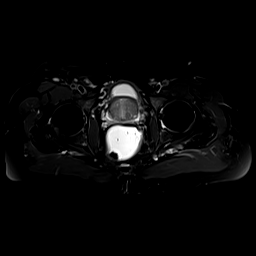
[im 35/52]
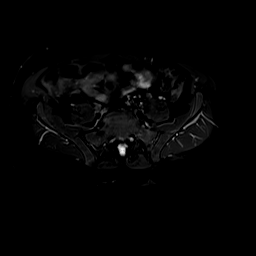
[im 52/52]
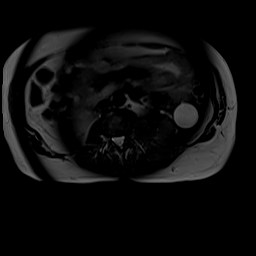

[Series 9: [id number]**_tracew_dfc_mix · axial · 3.0mm · 1.60mm/px · z∈[-79,+44]mm · 8 of 107 slices shown]
[im 1/107]
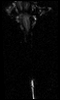
[im 16/107]
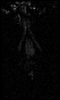
[im 31/107]
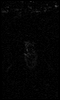
[im 46/107]
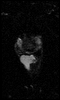
[im 61/107]
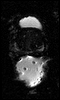
[im 76/107]
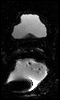
[im 91/107]
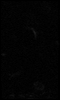
[im 107/107]
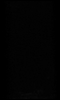

[Series 10: [id number]**_adc_dfc_mix · axial · 3.0mm · 1.60mm/px · z∈[-79,+44]mm · 3 of 36 slices shown]
[im 1/36]
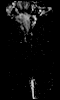
[im 18/36]
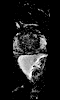
[im 36/36]
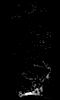

[Series 11: [id number]**_calc_bval_dfc_mix · axial · 3.0mm · 1.60mm/px · z∈[-79,+44]mm · 3 of 36 slices shown]
[im 1/36]
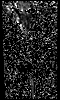
[im 18/36]
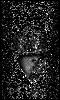
[im 36/36]
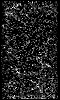

[Series 12: T2 · axial · 3.0mm · 0.70mm/px · z∈[-152,+15]mm · 4 of 50 slices shown (6 of 7)]
[im 1/50]
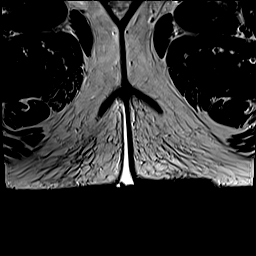
[im 17/50]
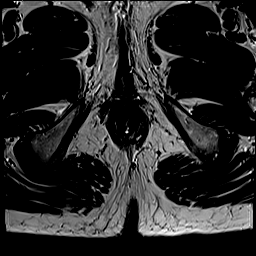
[im 33/50]
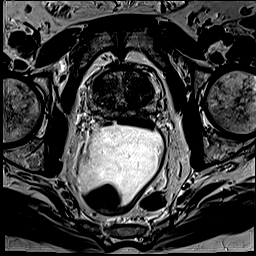
[im 50/50]
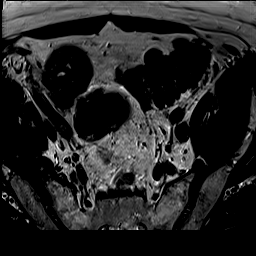

[Series 13: T2 · coronal · 3.0mm · 0.70mm/px · 3 of 42 slices shown (7 of 7)]
[im 1/42]
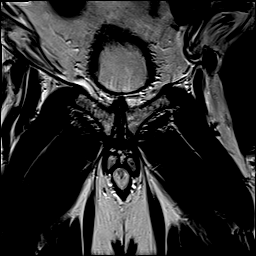
[im 21/42]
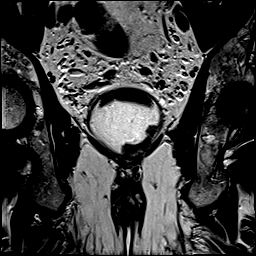
[im 42/42]
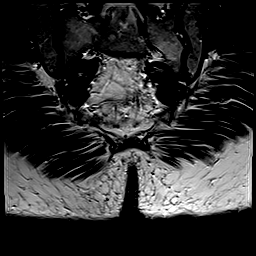

[48 of 48 positions shown; findings below may reference images not displayed]

FINDINGS: TUMOR LOCATION

Tumor distance from Anal Verge/Skin Surface:  4.5 cm

Tumor distance to Internal Anal Sphincter: 2.0 cm

TUMOR DESCRIPTION

Circumferential Extent: Left lateral, extending from [DATE] to [DATE]

Tumor Length: 3.8 cm

T - CATEGORY

Extension through Muscularis Propria: Yes 1-5mm=T3b, approximately 4
mm (series 2/image 20)

Shortest Distance of any tumor/node from Mesorectal Fascia: 4 mm

Extramural Vascular Invasion/Tumor Thrombus: No

Invasion of Anterior Peritoneal Reflection: No

Involvement of Adjacent Organs or Pelvic Sidewall: No

Levator Ani Involvement: No

N - CATEGORY

Mesorectal Lymph Nodes >=5mm: 1-3=N1, including an 8 mm short axis
presacral node (series 12/image 2) and a 13 mm short axis left
perirectal node (series 12/image 17).

Extra-mesorectal Lymphadenopathy: No

Other:  None.
IMPRESSION: 3.8 cm left low rectal mass, as described above.

Rectal adenocarcinoma T stage: T3b

Rectal adenocarcinoma N stage:  N2

Distance from tumor to the internal anal sphincter is 2.0 cm.

## 2019-08-27 ENCOUNTER — Other Ambulatory Visit: Payer: Self-pay | Admitting: Radiology

## 2019-08-28 ENCOUNTER — Other Ambulatory Visit: Payer: Self-pay

## 2019-08-28 ENCOUNTER — Telehealth: Payer: Self-pay

## 2019-08-28 NOTE — Telephone Encounter (Signed)
Called patient and made him aware that Dr. Alen Blew does not advise delaying treatment at this time. Patient verbalized understanding.

## 2019-08-28 NOTE — Progress Notes (Signed)
Pharmacist Chemotherapy Monitoring - Initial Assessment    Anticipated start date: 09/03/19   Regimen:  . Are orders appropriate based on the patient's diagnosis, regimen, and cycle? Yes . Does the plan date match the patient's scheduled date? Yes . Is the sequencing of drugs appropriate? Yes . Are the premedications appropriate for the patient's regimen? Yes . Prior Authorization for treatment is: Approved o If applicable, is the correct biosimilar selected based on the patient's insurance? not applicable  Organ Function and Labs: Marland Kitchen Are dose adjustments needed based on the patient's renal function, hepatic function, or hematologic function? No . Are appropriate labs ordered prior to the start of patient's treatment? Yes . Other organ system assessment, if indicated: N/A . The following baseline labs, if indicated, have been ordered: N/A  Dose Assessment: . Are the drug doses appropriate? Yes . Are the following correct: o Drug concentrations Yes o IV fluid compatible with drug Yes o Administration routes Yes o Timing of therapy Yes . If applicable, does the patient have documented access for treatment and/or plans for port-a-cath placement? yes . If applicable, have lifetime cumulative doses been properly documented and assessed? not applicable Lifetime Dose Tracking  No doses have been documented on this patient for the following tracked chemicals: Doxorubicin, Epirubicin, Idarubicin, Daunorubicin, Mitoxantrone, Bleomycin, Oxaliplatin, Carboplatin, Liposomal Doxorubicin  o   Toxicity Monitoring/Prevention: . The patient has the following take home antiemetics prescribed: Prochlorperazine . The patient has the following take home medications prescribed: N/A . Medication allergies and previous infusion related reactions, if applicable, have been reviewed and addressed. Yes . The patient's current medication list has been assessed for drug-drug interactions with their chemotherapy  regimen. no significant drug-drug interactions were identified on review.  Order Review: . Are the treatment plan orders signed? Yes . Is the patient scheduled to see a provider prior to their treatment? No  I verify that I have reviewed each item in the above checklist and answered each question accordingly.  Romualdo Bolk [facility] 08/28/2019 3:32 PM

## 2019-08-28 NOTE — Telephone Encounter (Signed)
-----   Message from Wyatt Portela, MD sent at 08/28/2019  9:04 AM EDT ----- I do not advise delaying treatment at this time.  Thanks ----- Message ----- From: Tami Lin, RN Sent: 08/28/2019   8:38 AM EDT To: Wyatt Portela, MD  Patient called and wants to make you aware that he has a broken tooth and it feels like there is an infection in his nasal cavity. He said the tooth has been broken a couple of weeks but the pain has recently gotten worse. Patient is scheduled for D1 C1 Folfox on 4/13. He is scheduled for port a cath placement tomorrow. Patient is going to contact his dentist when the office opens today. Patient wants to know if you recommend delaying port a cath placement and chemo until he has his tooth repaired.  Lanelle Bal

## 2019-08-29 ENCOUNTER — Ambulatory Visit (HOSPITAL_COMMUNITY)
Admission: RE | Admit: 2019-08-29 | Discharge: 2019-08-29 | Disposition: A | Discharge status: Home / Self Care | Payer: Medicare Other | Source: Ambulatory Visit | Attending: Oncology | Admitting: Oncology

## 2019-08-29 ENCOUNTER — Telehealth: Payer: Self-pay

## 2019-08-29 ENCOUNTER — Other Ambulatory Visit: Payer: Self-pay

## 2019-08-29 ENCOUNTER — Encounter (HOSPITAL_COMMUNITY): Payer: Self-pay

## 2019-08-29 DIAGNOSIS — Z01812 Encounter for preprocedural laboratory examination: Secondary | ICD-10-CM | POA: Insufficient documentation

## 2019-08-29 DIAGNOSIS — Z538 Procedure and treatment not carried out for other reasons: Secondary | ICD-10-CM | POA: Insufficient documentation

## 2019-08-29 DIAGNOSIS — C20 Malignant neoplasm of rectum: Secondary | ICD-10-CM

## 2019-08-29 MED ORDER — SODIUM CHLORIDE 0.9 % IV SOLN: INTRAVENOUS | Status: DC

## 2019-08-29 MED ORDER — CEFAZOLIN SODIUM-DEXTROSE 2-4 GM/100ML-% IV SOLN: 2.0000 g | INTRAVENOUS | Status: DC

## 2019-08-29 NOTE — Telephone Encounter (Signed)
Spoke to patient and he is aware to expect a call from the scheduling department in regards to rescheduling his chemo treatment after port a cath is placed. Patient verbalized understanding.

## 2019-08-29 NOTE — Progress Notes (Signed)
Procedure rescheduled because patient had solids at 0830. Procedure was scheduled for 4/8 at 1000. Pt ambulatory upon leaving. Given instructions for re-schedule date in when to stop eating.

## 2019-08-29 NOTE — Telephone Encounter (Signed)
-----   Message from Wyatt Portela, MD sent at 08/29/2019 11:08 AM EDT ----- Regarding: RE: Hermenia Bers. Thanks ----- Message ----- From: Tami Lin, RN Sent: 08/29/2019  10:49 AM EDT To: Wyatt Portela, MD Subject: Randell Patient- IR called. Patient was scheduled for port a cath placement this morning but he ate 2 doughnuts prior to coming. Patient has been rescheduled for Wednesday 4/14. His treatment (Folfox) was scheduled for 4/13. I will send a scheduling message to try and reschedule patient for Thursday 4/15. Lanelle Bal

## 2019-08-29 NOTE — Progress Notes (Signed)
Patient ID: Jared White, male   DOB: Apr 25, 1946, 74 y.o.   MRN: [id number] Patient presented to IR department today for Port-A-Cath placement.  Upon questioning patient he reported that he ate 2 donuts this morning at 8:30 AM.  There is no available space to perform procedure later this afternoon and patient does not want only local anesthesia for case.  He has been rescheduled for April 14 at [facility].  Patient updated.  Oncology will be notified.

## 2019-08-30 ENCOUNTER — Telehealth: Payer: Self-pay | Admitting: Oncology

## 2019-08-30 NOTE — Telephone Encounter (Signed)
R/s appt per 4/8 sch message - pt is aware of appt date and time

## 2019-09-03 ENCOUNTER — Other Ambulatory Visit: Payer: Self-pay | Admitting: Physician Assistant

## 2019-09-03 ENCOUNTER — Inpatient Hospital Stay: Payer: Medicare Other

## 2019-09-04 ENCOUNTER — Encounter (HOSPITAL_COMMUNITY): Payer: Self-pay

## 2019-09-04 ENCOUNTER — Other Ambulatory Visit: Payer: Self-pay

## 2019-09-04 ENCOUNTER — Ambulatory Visit (HOSPITAL_COMMUNITY)
Admission: RE | Admit: 2019-09-04 | Discharge: 2019-09-04 | Disposition: A | Discharge status: Home / Self Care | Payer: Medicare Other | Source: Ambulatory Visit | Attending: Oncology | Admitting: Oncology

## 2019-09-04 ENCOUNTER — Other Ambulatory Visit: Payer: Self-pay | Admitting: Oncology

## 2019-09-04 ENCOUNTER — Encounter: Payer: Self-pay | Admitting: Oncology

## 2019-09-04 DIAGNOSIS — I509 Heart failure, unspecified: Secondary | ICD-10-CM | POA: Insufficient documentation

## 2019-09-04 DIAGNOSIS — Z951 Presence of aortocoronary bypass graft: Secondary | ICD-10-CM | POA: Diagnosis not present

## 2019-09-04 DIAGNOSIS — K219 Gastro-esophageal reflux disease without esophagitis: Secondary | ICD-10-CM | POA: Insufficient documentation

## 2019-09-04 DIAGNOSIS — E785 Hyperlipidemia, unspecified: Secondary | ICD-10-CM | POA: Diagnosis not present

## 2019-09-04 DIAGNOSIS — Z7982 Long term (current) use of aspirin: Secondary | ICD-10-CM | POA: Diagnosis not present

## 2019-09-04 DIAGNOSIS — Z79899 Other long term (current) drug therapy: Secondary | ICD-10-CM | POA: Diagnosis not present

## 2019-09-04 DIAGNOSIS — I110 Hypertensive heart disease with heart failure: Secondary | ICD-10-CM | POA: Insufficient documentation

## 2019-09-04 DIAGNOSIS — Z452 Encounter for adjustment and management of vascular access device: Secondary | ICD-10-CM | POA: Diagnosis not present

## 2019-09-04 DIAGNOSIS — Z5111 Encounter for antineoplastic chemotherapy: Secondary | ICD-10-CM | POA: Diagnosis not present

## 2019-09-04 DIAGNOSIS — C20 Malignant neoplasm of rectum: Secondary | ICD-10-CM | POA: Diagnosis not present

## 2019-09-04 DIAGNOSIS — I2510 Atherosclerotic heart disease of native coronary artery without angina pectoris: Secondary | ICD-10-CM | POA: Diagnosis not present

## 2019-09-04 HISTORY — PX: IR IMAGING GUIDED PORT INSERTION: IMG5740

## 2019-09-04 LAB — CBC
HCT: 44 % (ref 39.0–52.0)
Hemoglobin: 14.2 g/dL (ref 13.0–17.0)
MCH: 30.6 pg (ref 26.0–34.0)
MCHC: 32.3 g/dL (ref 30.0–36.0)
MCV: 94.8 fL (ref 80.0–100.0)
Platelets: 154 10*3/uL (ref 150–400)
RBC: 4.64 MIL/uL (ref 4.22–5.81)
RDW: 13 % (ref 11.5–15.5)
WBC: 8.2 10*3/uL (ref 4.0–10.5)
nRBC: 0 % (ref 0.0–0.2)

## 2019-09-04 IMAGING — US IR IMAGING GUIDED PORT INSERTION
2 series · 2 of 2 positions shown · non-contrast
Comparison: none

CLINICAL DATA: RECTAL CA, ACCESS FOR CHEMOTHERAPY

[Series 1: (id) · 1 of 1 slices shown]
[im 1/1]
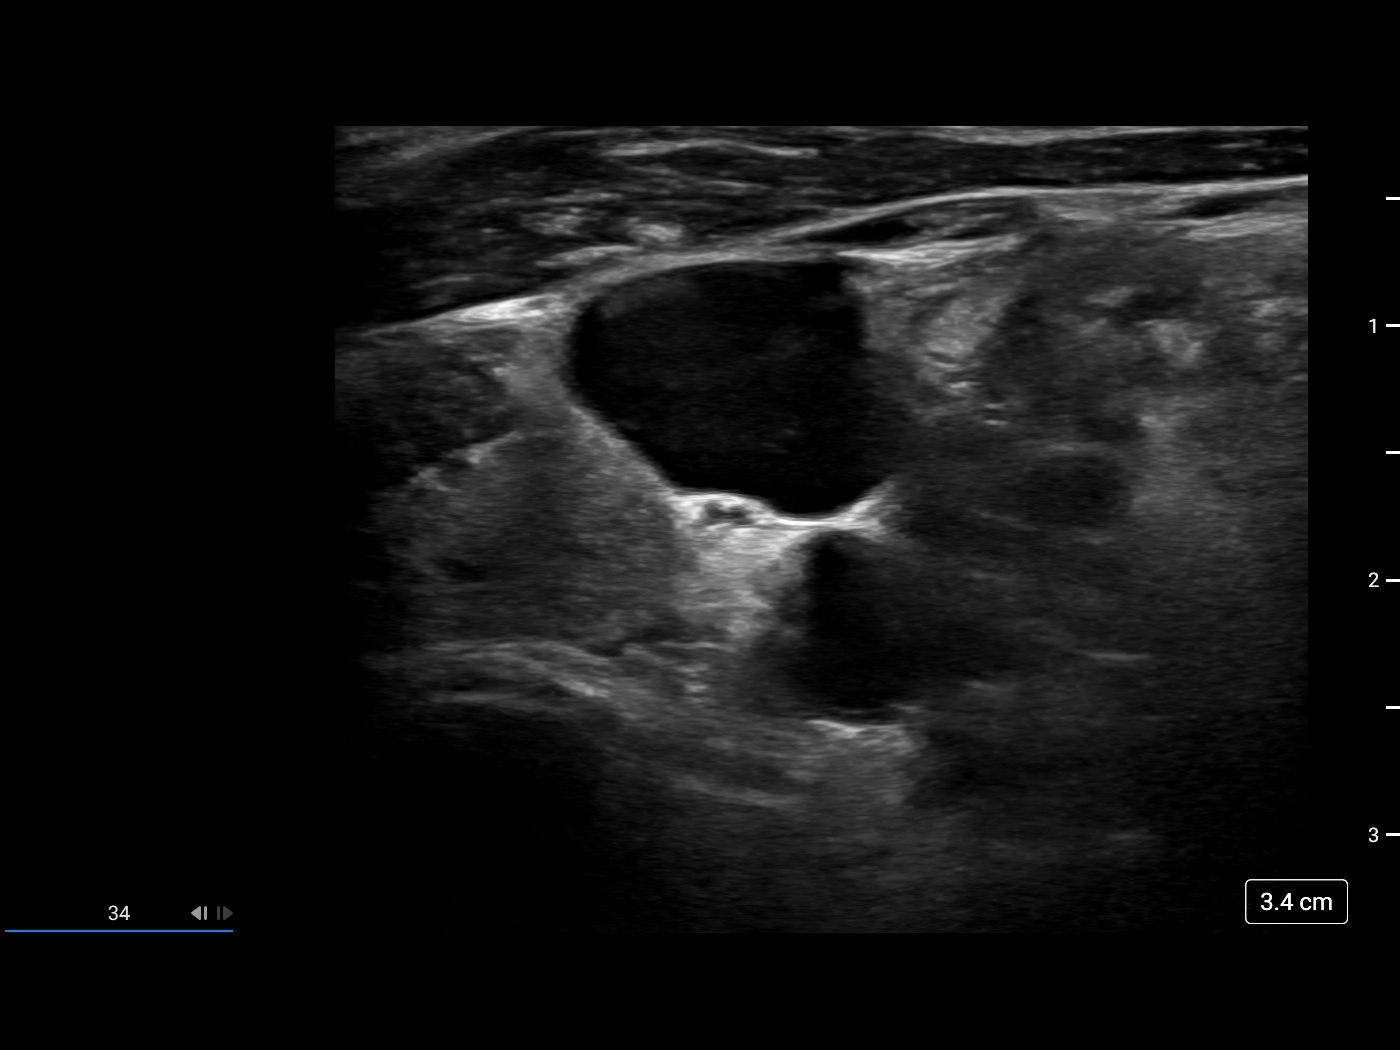

[Series 300: line placements · 1 of 1 slices shown]
[im 1/1]
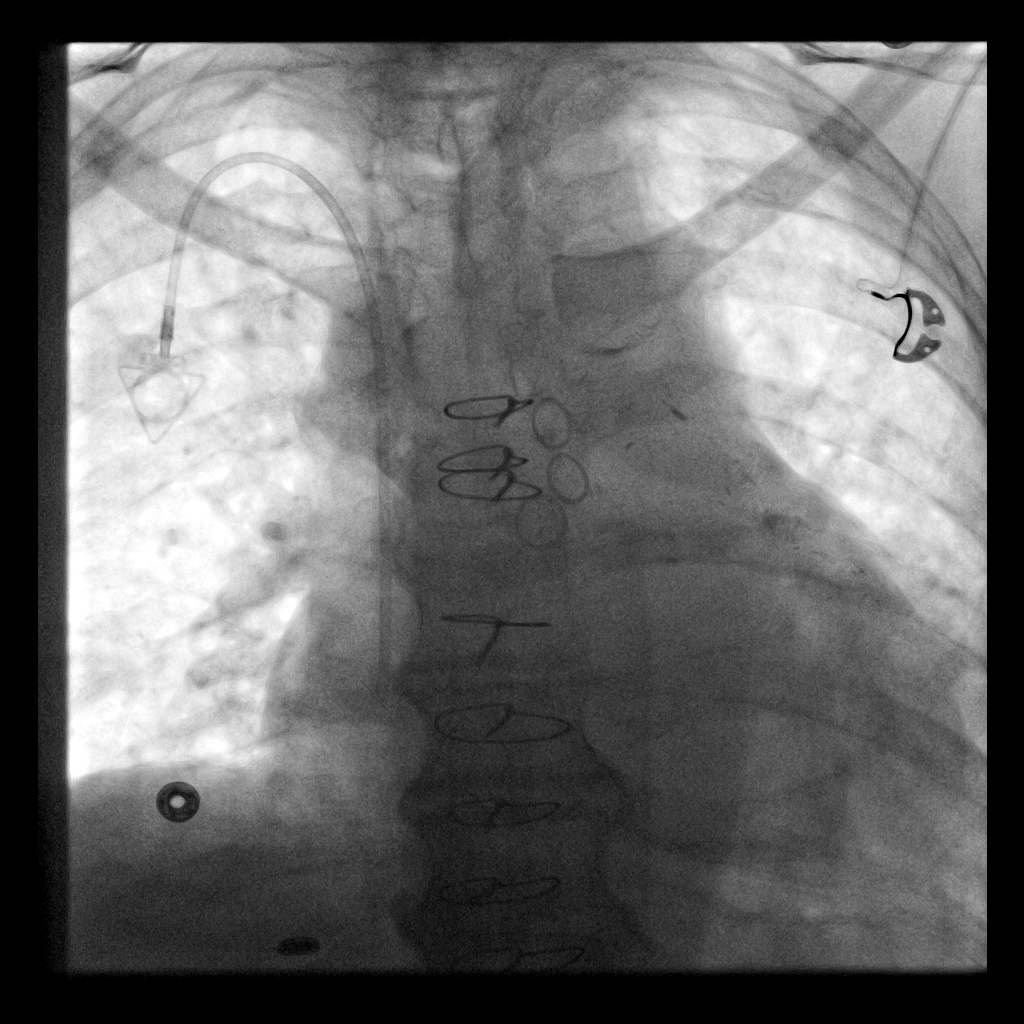

[2 of 2 positions shown; findings below may reference images not displayed]

EXAM:
RIGHT INTERNAL JUGULAR SINGLE LUMEN POWER PORT CATHETER INSERTION

Radiologist:  JOZOTRIM

Guidance:  Ultrasound and fluoroscopic

MEDICATIONS:
Ancef 2 g; The antibiotic was administered within an appropriate
time interval prior to skin puncture.

ANESTHESIA/SEDATION:
Versed 2.0 mg IV; Fentanyl 100 mcg IV;

Moderate Sedation Time:  18 minutes

The patient was continuously monitored during the procedure by the
interventional radiology nurse under my direct supervision.

FLUOROSCOPY TIME:  0 minutes, 36 seconds (5 mGy)

COMPLICATIONS:
None immediate.

CONTRAST:  None.

PROCEDURE:
Informed consent was obtained from the patient following explanation
of the procedure, risks, benefits and alternatives. The patient
understands, agrees and consents for the procedure. All questions
were addressed. A time out was performed.

Maximal barrier sterile technique utilized including caps, mask,
sterile gowns, sterile gloves, large sterile drape, hand hygiene,
and 2% chlorhexidine scrub.

Under sterile conditions and local anesthesia, right internal
jugular micropuncture venous access was performed. Access was
performed with ultrasound. Images were obtained for documentation of
the patent right internal jugular vein. A guide wire was inserted
followed by a transitional dilator. This allowed insertion of a
guide wire and catheter into the IVC. Measurements were obtained
from the SVC / RA junction back to the right IJ venotomy site. In
the right infraclavicular chest, a subcutaneous pocket was created
over the second anterior rib. This was done under sterile conditions
and local anesthesia. 1% lidocaine with epinephrine was utilized for
this. A 2.5 cm incision was made in the skin. Blunt dissection was
performed to create a subcutaneous pocket over the right pectoralis
major muscle. The pocket was flushed with saline vigorously. There
was adequate hemostasis. The port catheter was assembled and checked
for leakage. The port catheter was secured in the pocket with two
retention sutures. The tubing was tunneled subcutaneously to the
right venotomy site and inserted into the SVC/RA junction through a
valved peel-away sheath. Position was confirmed with fluoroscopy.
Images were obtained for documentation. The patient tolerated the
procedure well. No immediate complications. Incisions were closed in
a two layer fashion with 4 - 0 Vicryl suture. Dermabond was applied
to the skin. The port catheter was accessed, blood was aspirated
followed by saline and heparin flushes. Needle was removed. A dry
sterile dressing was applied.
IMPRESSION: Ultrasound and fluoroscopically guided right internal jugular single
lumen power port catheter insertion. Tip in the SVC/RA junction.
Catheter ready for use.

## 2019-09-04 MED ORDER — CEFAZOLIN SODIUM-DEXTROSE 2-4 GM/100ML-% IV SOLN: 2.0000 g | Freq: Once | INTRAVENOUS | Status: AC

## 2019-09-04 MED ORDER — LIDOCAINE-EPINEPHRINE 1 %-1:100000 IJ SOLN
INTRAMUSCULAR | Status: AC | PRN
  Administered 2019-09-04 (×2): 10 mL via INTRADERMAL

## 2019-09-04 MED ORDER — FENTANYL CITRATE (PF) 100 MCG/2ML IJ SOLN
INTRAMUSCULAR | Status: AC | PRN
  Administered 2019-09-04 (×2): 50 ug via INTRAVENOUS

## 2019-09-04 MED ORDER — LIDOCAINE-EPINEPHRINE 1 %-1:100000 IJ SOLN
INTRAMUSCULAR | Status: AC
  Filled 2019-09-04: qty 1

## 2019-09-04 MED ORDER — HEPARIN SOD (PORK) LOCK FLUSH 100 UNIT/ML IV SOLN
INTRAVENOUS | Status: AC
  Filled 2019-09-04: qty 5

## 2019-09-04 MED ORDER — SODIUM CHLORIDE 0.9 % IV SOLN: INTRAVENOUS | Status: DC

## 2019-09-04 MED ORDER — HEPARIN SOD (PORK) LOCK FLUSH 100 UNIT/ML IV SOLN
INTRAVENOUS | Status: AC | PRN
  Administered 2019-09-04: 500 [IU] via INTRAVENOUS

## 2019-09-04 MED ORDER — CEFAZOLIN SODIUM-DEXTROSE 2-4 GM/100ML-% IV SOLN
INTRAVENOUS | Status: AC
  Administered 2019-09-04: 12:00:00 2 g via INTRAVENOUS
  Filled 2019-09-04: qty 100

## 2019-09-04 MED ORDER — FENTANYL CITRATE (PF) 100 MCG/2ML IJ SOLN
INTRAMUSCULAR | Status: AC
  Filled 2019-09-04: qty 2

## 2019-09-04 MED ORDER — MIDAZOLAM HCL 2 MG/2ML IJ SOLN
INTRAMUSCULAR | Status: AC
  Filled 2019-09-04: qty 2

## 2019-09-04 MED ORDER — MIDAZOLAM HCL 2 MG/2ML IJ SOLN
INTRAMUSCULAR | Status: AC | PRN
  Administered 2019-09-04 (×2): 1 mg via INTRAVENOUS

## 2019-09-04 NOTE — Procedures (Signed)
Interventional Radiology Procedure Note  Procedure: RT IJ POWER PORT  Complications: None  Estimated Blood Loss: MIN  Findings: TIP SVCRA       

## 2019-09-04 NOTE — Progress Notes (Signed)
Called pt to introduce myself as his Financial Resource Specialist and to discuss the [employer] grant.  Pt has 2 insurances so copay assistance shouldn't be needed.  I left a msg requesting he return my call if he would like to apply for the grant.  °

## 2019-09-04 NOTE — Discharge Instructions (Addendum)
Urgent needs - IR on call MD [phone]  Wound - May remove dressing and shower in 24 to 48 hours.  Keep site clean and dry.  Replace with bandaid. Do not submerge in tub or water until site healing well.  If ordered by your provider, may start Emla cream in 2 weeks or after incision is healed.  ICE pack for pain  After completion of treatment, your provider should have you set up for monthly port flushes.   Implanted Port Insertion, Care After This sheet gives you information about how to care for yourself after your procedure. Your health care provider may also give you more specific instructions. If you have problems or questions, contact your health care provider. What can I expect after the procedure? After the procedure, it is common to have:  Discomfort at the port insertion site.  Bruising on the skin over the port. This should improve over 3-4 days. Follow these instructions at home: [facility] care  After your port is placed, you will get a manufacturer's information card. The card has information about your port. Keep this card with you at all times.  Take care of the port as told by your health care provider. Ask your health care provider if you or a family member can get training for taking care of the port at home. A home health care nurse may also take care of the port.  Make sure to remember what type of port you have. Incision care  Follow instructions from your health care provider about how to take care of your port insertion site. Make sure you: ? Wash your hands with soap and water before and after you change your bandage (dressing). If soap and water are not available, use hand sanitizer. ? Change your dressing as told by your health care provider. ? Leave stitches (sutures), skin glue, or adhesive strips in place. These skin closures may need to stay in place for 2 weeks or longer. If adhesive strip edges start to loosen and curl up, you may trim the loose edges.  Do not remove adhesive strips completely unless your health care provider tells you to do that.  Check your port insertion site every day for signs of infection. Check for: ? Redness, swelling, or pain. ? Fluid or blood. ? Warmth. ? Pus or a bad smell. Activity  Return to your normal activities as told by your health care provider. Ask your health care provider what activities are safe for you.  Do not lift anything that is heavier than 10 lb (4.5 kg), or the limit that you are told, until your health care provider says that it is safe. General instructions  Take over-the-counter and prescription medicines only as told by your health care provider.  Do not take baths, swim, or use a hot tub until your health care provider approves. Ask your health care provider if you may take showers. You may only be allowed to take sponge baths.  Do not drive for 24 hours if you were given a sedative during your procedure.  Wear a medical alert bracelet in case of an emergency. This will tell any health care providers that you have a port.  Keep all follow-up visits as told by your health care provider. This is important. Contact a health care provider if:  You cannot flush your port with saline as directed, or you cannot draw blood from the port.  You have a fever or chills.  You have redness, swelling, or  pain around your port insertion site.  You have fluid or blood coming from your port insertion site.  Your port insertion site feels warm to the touch.  You have pus or a bad smell coming from the port insertion site. Get help right away if:  You have chest pain or shortness of breath.  You have bleeding from your port that you cannot control. Summary  Take care of the port as told by your health care provider. Keep the manufacturer's information card with you at all times.  Change your dressing as told by your health care provider.  Contact a health care provider if you have a  fever or chills or if you have redness, swelling, or pain around your port insertion site.  Keep all follow-up visits as told by your health care provider. This information is not intended to replace advice given to you by your health care provider. Make sure you discuss any questions you have with your health care provider. Document Revised: 12/05/2017 Document Reviewed: 12/05/2017 Elsevier Patient Education  [place]  .Moderate Conscious Sedation, Adult, Care After These instructions provide you with information about caring for yourself after your procedure. Your health care provider may also give you more specific instructions. Your treatment has been planned according to current medical practices, but problems sometimes occur. Call your health care provider if you have any problems or questions after your procedure. What can I expect after the procedure? After your procedure, it is common:  To feel sleepy for several hours.  To feel clumsy and have poor balance for several hours.  To have poor judgment for several hours.  To vomit if you eat too soon. Follow these instructions at home: For at least 24 hours after the procedure:  Do not: ? Participate in activities where you could fall or become injured. ? Drive. ? Use heavy machinery. ? Drink alcohol. ? Take sleeping pills or medicines that cause drowsiness. ? Make important decisions or sign legal documents. ? Take care of children on your own.  Rest. Eating and drinking  Follow the diet recommended by your health care provider.  If you vomit: ? Drink water, juice, or soup when you can drink without vomiting. ? Make sure you have little or no nausea before eating solid foods. General instructions  Have a responsible adult stay with you until you are awake and alert.  Take over-the-counter and prescription medicines only as told by your health care provider.  If you smoke, do not smoke without  supervision.  Keep all follow-up visits as told by your health care provider. This is important. Contact a health care provider if:  You keep feeling nauseous or you keep vomiting.  You feel light-headed.  You develop a rash.  You have a fever. Get help right away if:  You have trouble breathing. This information is not intended to replace advice given to you by your health care provider. Make sure you discuss any questions you have with your health care provider. Document Revised: 04/21/2017 Document Reviewed: 08/29/2015 Elsevier Patient Education  2020 [employer].

## 2019-09-04 NOTE — H&P (Signed)
Referring Physician(s): Wyatt Portela  Supervising Physician: Daryll Brod  Patient Status:  [facility]  Chief Complaint: "I'm getting a port a cath"   Subjective: Patient familiar to IR service from random right renal biopsy in 2017.  He has a history of newly diagnosed rectal carcinoma and presents today for Port-A-Cath placement for chemotherapy.  He currently denies fever, headache, chest pain, dyspnea, nausea, vomiting.  He does have occasional cough, some abdominal bloating/constipation and occasional rectal bleeding.  Past Medical History:  Diagnosis Date  . Broken back   . CAD (coronary artery disease)    PCI & CABG  . CHF (congestive heart failure) ([place])   . Diverticulitis    mild - approx 2004  . Family history of heart disease   . GERD (gastroesophageal reflux disease)   . History of nuclear stress test 06/30/2011   lexiscan; normal pattern of perfusion post-stress; low risk scan   . Hyperlipidemia   . Hypertension   . IgA nephropathy   . Irregular heart beat   . Rectal cancer ([place]) 08/19/2019  . Systemic hypertension    Past Surgical History:  Procedure Laterality Date  . CARDIAC CATHETERIZATION  08/1995   PTCA of OM (Dr. Marella Chimes)  . CORONARY ARTERY BYPASS GRAFT  01/2000   x5 - LIMA to LAD, SVG to diagonal, sequential SVG to ramus & OM, SVG to PDA (Dr. Tharon Aquas Trigt)  . LUNG LOBECTOMY     left upper lobe  . TRANSTHORACIC ECHOCARDIOGRAM  12/24/2012   EF 55-60%, mild LVH, mild conc hypertrophy; mild MR; LA mildly dilated       Allergies: Patient has no known allergies.  Medications: Prior to Admission medications   Medication Sig Start Date End Date Taking? Authorizing Provider  aspirin EC 81 MG tablet Take 1 tablet (81 mg total) by mouth daily. 09/13/18   Patwardhan, Manish J, MD  atenolol (TENORMIN) 50 MG tablet TAKE 1 TABLET BY MOUTH EVERY DAY 05/07/19   Patwardhan, Manish J, MD  calcium carbonate (OSCAL) 1500 (600 Ca) MG TABS tablet Take by  mouth daily.    [provider]  clotrimazole-betamethasone (LOTRISONE) cream Apply 1 application topically 2 (two) times daily. Patient not taking: Reported on 08/13/2019 11/10/16   [name], MD  famotidine (PEPCID) 20 MG tablet Take 1 tablet (20 mg total) by mouth 2 (two) times daily. 01/21/16   Wardell Honour, MD  fluticasone Asencion Islam) 50 MCG/ACT nasal spray INSTILL 2 SPRAYS INTO EACH NOSTRIL EVERY DAY Patient not taking: Reported on 08/13/2019 03/05/18   [name], MD  lidocaine-prilocaine (EMLA) cream Apply 1 application topically as needed. 08/19/19   Wyatt Portela, MD  Magnesium Oxide 400 MG CAPS Take 1 capsule (400 mg total) by mouth 2 (two) times daily. 10/09/15   Domenic Polite, MD  Omega-3 Fatty Acids (OMEGA 3 PO) Take 520 mg by mouth daily.     [provider]  omeprazole (PRILOSEC) 40 MG capsule Take 1 capsule (40 mg total) by mouth 2 (two) times daily. 08/13/19   Mansouraty, Telford Nab., MD  prochlorperazine (COMPAZINE) 10 MG tablet Take 1 tablet (10 mg total) by mouth every 6 (six) hours as needed for nausea or vomiting. 08/19/19   Wyatt Portela, MD  rosuvastatin (CRESTOR) 20 MG tablet TAKE 1 TABLET BY MOUTH EVERY DAY 03/08/19   Patwardhan, Manish J, MD  valsartan (DIOVAN) 160 MG tablet TAKE 1 TABLET BY MOUTH ONE TIME DAILY. 07/17/19   Patwardhan,  Manish J, MD  vitamin B-12 (CYANOCOBALAMIN) 1000 MCG tablet Take 1,000 mcg by mouth daily.    [provider]     Vital Signs: BP (!) 157/79 (BP Location: Left Arm)   Pulse 67   Temp 98.4 F (36.9 C) (Oral)   Resp 16   SpO2 98%   Physical Exam awake, alert.  Chest clear to auscultation bilaterally.  Heart with regular rate and rhythm.  Abdomen soft, positive bowel sounds, currently nontender.  No lower extremity edema.  Imaging: No results found.  Labs:  CBC: No results for input(s): WBC, HGB, HCT, PLT in the last 8760 hours.  COAGS: No results for input(s): INR, APTT in the last  8760 hours.  BMP: Recent Labs    08/16/19 0913  BUN 23  CREATININE 1.54*    LIVER FUNCTION TESTS: No results for input(s): BILITOT, AST, ALT, ALKPHOS, PROT, ALBUMIN in the last 8760 hours.  Assessment and Plan: Patient with history of newly diagnosed rectal carcinoma who presents today for Port-A-Cath placement for chemotherapy.Risks and benefits of image guided port-a-catheter placement was discussed with the patient including, but not limited to bleeding, infection, pneumothorax, or fibrin sheath development and need for additional procedures.  All of the patient's questions were answered, patient is agreeable to proceed. Consent signed and in chart.     Electronically Signed: D. Rowe Robert, PA-C 09/04/2019, 10:50 AM   I spent a total of 25 minutes at the the patient's bedside AND on the patient's hospital floor or unit, greater than 50% of which was counseling/coordinating care for Port-A-Cath placement

## 2019-09-05 ENCOUNTER — Inpatient Hospital Stay: Payer: Medicare Other

## 2019-09-05 ENCOUNTER — Other Ambulatory Visit: Payer: Self-pay

## 2019-09-05 ENCOUNTER — Encounter: Payer: Self-pay | Admitting: Oncology

## 2019-09-05 ENCOUNTER — Telehealth: Payer: Self-pay

## 2019-09-05 VITALS — BP 120/71

## 2019-09-05 DIAGNOSIS — R112 Nausea with vomiting, unspecified: Secondary | ICD-10-CM | POA: Diagnosis not present

## 2019-09-05 DIAGNOSIS — Z5111 Encounter for antineoplastic chemotherapy: Secondary | ICD-10-CM | POA: Diagnosis not present

## 2019-09-05 DIAGNOSIS — Z95828 Presence of other vascular implants and grafts: Secondary | ICD-10-CM

## 2019-09-05 DIAGNOSIS — G629 Polyneuropathy, unspecified: Secondary | ICD-10-CM | POA: Diagnosis not present

## 2019-09-05 DIAGNOSIS — C189 Malignant neoplasm of colon, unspecified: Secondary | ICD-10-CM | POA: Diagnosis not present

## 2019-09-05 DIAGNOSIS — Z79899 Other long term (current) drug therapy: Secondary | ICD-10-CM | POA: Diagnosis not present

## 2019-09-05 DIAGNOSIS — C20 Malignant neoplasm of rectum: Secondary | ICD-10-CM

## 2019-09-05 LAB — CBC WITH DIFFERENTIAL (CANCER CENTER ONLY)
Abs Immature Granulocytes: 0.04 10*3/uL (ref 0.00–0.07)
Basophils Absolute: 0.1 10*3/uL (ref 0.0–0.1)
Basophils Relative: 1 %
Eosinophils Absolute: 0.1 10*3/uL (ref 0.0–0.5)
Eosinophils Relative: 1 %
HCT: 42.8 % (ref 39.0–52.0)
Hemoglobin: 14 g/dL (ref 13.0–17.0)
Immature Granulocytes: 1 %
Lymphocytes Relative: 17 %
Lymphs Abs: 1.4 10*3/uL (ref 0.7–4.0)
MCH: 30.2 pg (ref 26.0–34.0)
MCHC: 32.7 g/dL (ref 30.0–36.0)
MCV: 92.2 fL (ref 80.0–100.0)
Monocytes Absolute: 0.7 10*3/uL (ref 0.1–1.0)
Monocytes Relative: 9 %
Neutro Abs: 6.1 10*3/uL (ref 1.7–7.7)
Neutrophils Relative %: 71 %
Platelet Count: 153 10*3/uL (ref 150–400)
RBC: 4.64 MIL/uL (ref 4.22–5.81)
RDW: 13.1 % (ref 11.5–15.5)
WBC Count: 8.4 10*3/uL (ref 4.0–10.5)
nRBC: 0 % (ref 0.0–0.2)

## 2019-09-05 LAB — CMP (CANCER CENTER ONLY)
ALT: 13 U/L (ref 0–44)
AST: 16 U/L (ref 15–41)
Albumin: 3.4 g/dL — ABNORMAL LOW (ref 3.5–5.0)
Alkaline Phosphatase: 75 U/L (ref 38–126)
Anion gap: 7 (ref 5–15)
BUN: 22 mg/dL (ref 8–23)
CO2: 24 mmol/L (ref 22–32)
Calcium: 8.8 mg/dL — ABNORMAL LOW (ref 8.9–10.3)
Chloride: 109 mmol/L (ref 98–111)
Creatinine: 1.52 mg/dL — ABNORMAL HIGH (ref 0.61–1.24)
GFR, Est AFR Am: 52 mL/min — ABNORMAL LOW (ref >60)
GFR, Estimated: 45 mL/min — ABNORMAL LOW (ref >60)
Glucose, Bld: 127 mg/dL — ABNORMAL HIGH (ref 70–99)
Potassium: 4 mmol/L (ref 3.5–5.1)
Sodium: 140 mmol/L (ref 135–145)
Total Bilirubin: 0.4 mg/dL (ref 0.3–1.2)
Total Protein: 6.6 g/dL (ref 6.5–8.1)

## 2019-09-05 LAB — CEA (IN HOUSE-CHCC): CEA (CHCC-In House): 1.23 ng/mL (ref 0.00–5.00)

## 2019-09-05 MED ORDER — PALONOSETRON HCL INJECTION 0.25 MG/5ML
0.2500 mg | Freq: Once | INTRAVENOUS | Status: AC
  Administered 2019-09-05: 0.25 mg via INTRAVENOUS

## 2019-09-05 MED ORDER — OXALIPLATIN CHEMO INJECTION 100 MG/20ML
85.0000 mg/m2 | Freq: Once | INTRAVENOUS | Status: AC
  Administered 2019-09-05: 165 mg via INTRAVENOUS
  Filled 2019-09-05: qty 33

## 2019-09-05 MED ORDER — PALONOSETRON HCL INJECTION 0.25 MG/5ML
INTRAVENOUS | Status: AC
  Filled 2019-09-05: qty 5

## 2019-09-05 MED ORDER — HEPARIN SOD (PORK) LOCK FLUSH 100 UNIT/ML IV SOLN
500.0000 [IU] | Freq: Once | INTRAVENOUS | Status: DC | PRN
  Filled 2019-09-05: qty 5

## 2019-09-05 MED ORDER — SODIUM CHLORIDE 0.9% FLUSH
10.0000 mL | INTRAVENOUS | Status: DC | PRN
  Filled 2019-09-05: qty 10

## 2019-09-05 MED ORDER — SODIUM CHLORIDE 0.9 % IV SOLN
10.0000 mg | Freq: Once | INTRAVENOUS | Status: AC
  Administered 2019-09-05: 10 mg via INTRAVENOUS
  Filled 2019-09-05: qty 10

## 2019-09-05 MED ORDER — FLUOROURACIL CHEMO INJECTION 2.5 GM/50ML
400.0000 mg/m2 | Freq: Once | INTRAVENOUS | Status: AC
  Administered 2019-09-05: 750 mg via INTRAVENOUS
  Filled 2019-09-05: qty 15

## 2019-09-05 MED ORDER — LEUCOVORIN CALCIUM INJECTION 350 MG
400.0000 mg/m2 | Freq: Once | INTRAVENOUS | Status: AC
  Administered 2019-09-05: 772 mg via INTRAVENOUS
  Filled 2019-09-05: qty 38.6

## 2019-09-05 MED ORDER — SODIUM CHLORIDE 0.9 % IV SOLN
2400.0000 mg/m2 | INTRAVENOUS | Status: DC
  Administered 2019-09-05: 4650 mg via INTRAVENOUS
  Filled 2019-09-05: qty 93

## 2019-09-05 MED ORDER — SODIUM CHLORIDE 0.9% FLUSH
10.0000 mL | INTRAVENOUS | Status: DC | PRN
  Administered 2019-09-05: 10 mL via INTRAVENOUS
  Filled 2019-09-05: qty 10

## 2019-09-05 MED ORDER — DEXTROSE 5 % IV SOLN
Freq: Once | INTRAVENOUS | Status: AC
  Filled 2019-09-05: qty 250

## 2019-09-05 NOTE — Patient Instructions (Signed)

## 2019-09-05 NOTE — Progress Notes (Signed)
Per Dr. Alen Blew, [place] to adjust 5FU infusion rate in order to complete infusion on Saturday by 1:30PM. Per Kennith Center, RPH, new infusion rate will be 3.54mL/hr.

## 2019-09-05 NOTE — Telephone Encounter (Signed)
Per Dr. Alen Blew, ok to treat with labs from 09/05/19.

## 2019-09-05 NOTE — Patient Instructions (Addendum)
Oxaliplatin Injection What is this medicine? OXALIPLATIN (ox AL i PLA tin) is a chemotherapy drug. It targets fast dividing cells, like cancer cells, and causes these cells to die. This medicine is used to treat cancers of the colon and rectum, and many other cancers. This medicine may be used for other purposes; ask your health care provider or pharmacist if you have questions. COMMON BRAND NAME(S): Eloxatin What should I tell my health care provider before I take this medicine? They need to know if you have any of these conditions:  heart disease  history of irregular heartbeat  liver disease  low blood counts, like white cells, platelets, or red blood cells  lung or breathing disease, like asthma  take medicines that treat or prevent blood clots  tingling of the fingers or toes, or other nerve disorder  an unusual or allergic reaction to oxaliplatin, other chemotherapy, other medicines, foods, dyes, or preservatives  pregnant or trying to get pregnant  breast-feeding How should I use this medicine? This drug is given as an infusion into a vein. It is administered in a hospital or clinic by a specially trained health care professional. Talk to your pediatrician regarding the use of this medicine in children. Special care may be needed. Overdosage: If you think you have taken too much of this medicine contact a poison control center or emergency room at once. NOTE: This medicine is only for you. Do not share this medicine with others. What if I miss a dose? It is important not to miss a dose. Call your doctor or health care professional if you are unable to keep an appointment. What may interact with this medicine? Do not take this medicine with any of the following medications:  cisapride  dronedarone  pimozide  thioridazine This medicine may also interact with the following medications:  aspirin and aspirin-like medicines  certain medicines that treat or prevent  blood clots like warfarin, apixaban, dabigatran, and rivaroxaban  cisplatin  cyclosporine  diuretics  medicines for infection like acyclovir, adefovir, amphotericin B, bacitracin, cidofovir, foscarnet, ganciclovir, gentamicin, pentamidine, vancomycin  NSAIDs, medicines for pain and inflammation, like ibuprofen or naproxen  other medicines that prolong the QT interval (an abnormal heart rhythm)  pamidronate  zoledronic acid This list may not describe all possible interactions. Give your health care provider a list of all the medicines, herbs, non-prescription drugs, or dietary supplements you use. Also tell them if you smoke, drink alcohol, or use illegal drugs. Some items may interact with your medicine. What should I watch for while using this medicine? Your condition will be monitored carefully while you are receiving this medicine. You may need blood work done while you are taking this medicine. This medicine may make you feel generally unwell. This is not uncommon as chemotherapy can affect healthy cells as well as cancer cells. Report any side effects. Continue your course of treatment even though you feel ill unless your healthcare professional tells you to stop. This medicine can make you more sensitive to cold. Do not drink cold drinks or use ice. Cover exposed skin before coming in contact with cold temperatures or cold objects. When out in cold weather wear warm clothing and cover your mouth and nose to warm the air that goes into your lungs. Tell your doctor if you get sensitive to the cold. Do not become pregnant while taking this medicine or for 9 months after stopping it. Women should inform their health care professional if they wish to become   pregnant or think they might be pregnant. Men should not father a child while taking this medicine and for 6 months after stopping it. There is potential for serious side effects to an unborn child. Talk to your health care professional  for more information. Do not breast-feed a child while taking this medicine or for 3 months after stopping it. This medicine has caused ovarian failure in some women. This medicine may make it more difficult to get pregnant. Talk to your health care professional if you are concerned about your fertility. This medicine has caused decreased sperm counts in some men. This may make it more difficult to father a child. Talk to your health care professional if you are concerned about your fertility. This medicine may increase your risk of getting an infection. Call your health care professional for advice if you get a fever, chills, or sore throat, or other symptoms of a cold or flu. Do not treat yourself. Try to avoid being around people who are sick. Avoid taking medicines that contain aspirin, acetaminophen, ibuprofen, naproxen, or ketoprofen unless instructed by your health care professional. These medicines may hide a fever. Be careful brushing or flossing your teeth or using a toothpick because you may get an infection or bleed more easily. If you have any dental work done, tell your dentist you are receiving this medicine. What side effects may I notice from receiving this medicine? Side effects that you should report to your doctor or health care professional as soon as possible:  allergic reactions like skin rash, itching or hives, swelling of the face, lips, or tongue  breathing problems  cough  low blood counts - this medicine may decrease the number of white blood cells, red blood cells, and platelets. You may be at increased risk for infections and bleeding  nausea, vomiting  pain, redness, or irritation at site where injected  pain, tingling, numbness in the hands or feet  signs and symptoms of bleeding such as bloody or black, tarry stools; red or dark brown urine; spitting up blood or brown material that looks like coffee grounds; red spots on the skin; unusual bruising or bleeding  from the eyes, gums, or nose  signs and symptoms of a dangerous change in heartbeat or heart rhythm like chest pain; dizziness; fast, irregular heartbeat; palpitations; feeling faint or lightheaded; falls  signs and symptoms of infection like fever; chills; cough; sore throat; pain or trouble passing urine  signs and symptoms of liver injury like dark yellow or brown urine; general ill feeling or flu-like symptoms; light-colored stools; loss of appetite; nausea; right upper belly pain; unusually weak or tired; yellowing of the eyes or skin  signs and symptoms of low red blood cells or anemia such as unusually weak or tired; feeling faint or lightheaded; falls  signs and symptoms of muscle injury like dark urine; trouble passing urine or change in the amount of urine; unusually weak or tired; muscle pain; back pain Side effects that usually do not require medical attention (report to your doctor or health care professional if they continue or are bothersome):  changes in taste  diarrhea  gas  hair loss  loss of appetite  mouth sores This list may not describe all possible side effects. Call your doctor for medical advice about side effects. You may report side effects to FDA at 1-800-FDA-1088. Where should I keep my medicine? This drug is given in a hospital or clinic and will not be stored at home. NOTE:   This sheet is a summary. It may not cover all possible information. If you have questions about this medicine, talk to your doctor, pharmacist, or health care provider.  2020 Elsevier/Gold Standard (2018-09-26 12:20:35)  Leucovorin injection What is this medicine? LEUCOVORIN (loo koe VOR in) is used to prevent or treat the harmful effects of some medicines. This medicine is used to treat anemia caused by a low amount of folic acid in the body. It is also used with 5-fluorouracil (5-FU) to treat colon cancer. This medicine may be used for other purposes; ask your health care provider  or pharmacist if you have questions. What should I tell my health care provider before I take this medicine? They need to know if you have any of these conditions:  anemia from low levels of vitamin B-12 in the blood  an unusual or allergic reaction to leucovorin, folic acid, other medicines, foods, dyes, or preservatives  pregnant or trying to get pregnant  breast-feeding How should I use this medicine? This medicine is for injection into a muscle or into a vein. It is given by a health care professional in a hospital or clinic setting. Talk to your pediatrician regarding the use of this medicine in children. Special care may be needed. Overdosage: If you think you have taken too much of this medicine contact a poison control center or emergency room at once. NOTE: This medicine is only for you. Do not share this medicine with others. What if I miss a dose? This does not apply. What may interact with this medicine?  capecitabine  fluorouracil  phenobarbital  phenytoin  primidone  trimethoprim-sulfamethoxazole This list may not describe all possible interactions. Give your health care provider a list of all the medicines, herbs, non-prescription drugs, or dietary supplements you use. Also tell them if you smoke, drink alcohol, or use illegal drugs. Some items may interact with your medicine. What should I watch for while using this medicine? Your condition will be monitored carefully while you are receiving this medicine. This medicine may increase the side effects of 5-fluorouracil, 5-FU. Tell your doctor or health care professional if you have diarrhea or mouth sores that do not get better or that get worse. What side effects may I notice from receiving this medicine? Side effects that you should report to your doctor or health care professional as soon as possible:  allergic reactions like skin rash, itching or hives, swelling of the face, lips, or tongue  breathing  problems  fever, infection  mouth sores  unusual bleeding or bruising  unusually weak or tired Side effects that usually do not require medical attention (report to your doctor or health care professional if they continue or are bothersome):  constipation or diarrhea  loss of appetite  nausea, vomiting This list may not describe all possible side effects. Call your doctor for medical advice about side effects. You may report side effects to FDA at 1-800-FDA-1088. Where should I keep my medicine? This drug is given in a hospital or clinic and will not be stored at home. NOTE: This sheet is a summary. It may not cover all possible information. If you have questions about this medicine, talk to your doctor, pharmacist, or health care provider.  2020 Elsevier/Gold Standard (2007-11-13 16:50:29)  Fluorouracil, 5-FU injection What is this medicine? FLUOROURACIL, 5-FU (flure oh YOOR a sil) is a chemotherapy drug. It slows the growth of cancer cells. This medicine is used to treat many types of cancer like breast cancer,   colon or rectal cancer, pancreatic cancer, and stomach cancer. This medicine may be used for other purposes; ask your health care provider or pharmacist if you have questions. COMMON BRAND NAME(S): Adrucil What should I tell my health care provider before I take this medicine? They need to know if you have any of these conditions:  blood disorders  dihydropyrimidine dehydrogenase (DPD) deficiency  infection (especially a virus infection such as chickenpox, cold sores, or herpes)  kidney disease  liver disease  malnourished, poor nutrition  recent or ongoing radiation therapy  an unusual or allergic reaction to fluorouracil, other chemotherapy, other medicines, foods, dyes, or preservatives  pregnant or trying to get pregnant  breast-feeding How should I use this medicine? This drug is given as an infusion or injection into a vein. It is administered in a  hospital or clinic by a specially trained health care professional. Talk to your pediatrician regarding the use of this medicine in children. Special care may be needed. Overdosage: If you think you have taken too much of this medicine contact a poison control center or emergency room at once. NOTE: This medicine is only for you. Do not share this medicine with others. What if I miss a dose? It is important not to miss your dose. Call your doctor or health care professional if you are unable to keep an appointment. What may interact with this medicine?  allopurinol  cimetidine  dapsone  digoxin  hydroxyurea  leucovorin  levamisole  medicines for seizures like ethotoin, fosphenytoin, phenytoin  medicines to increase blood counts like filgrastim, pegfilgrastim, sargramostim  medicines that treat or prevent blood clots like warfarin, enoxaparin, and dalteparin  methotrexate  metronidazole  pyrimethamine  some other chemotherapy drugs like busulfan, cisplatin, estramustine, vinblastine  trimethoprim  trimetrexate  vaccines Talk to your doctor or health care professional before taking any of these medicines:  acetaminophen  aspirin  ibuprofen  ketoprofen  naproxen This list may not describe all possible interactions. Give your health care provider a list of all the medicines, herbs, non-prescription drugs, or dietary supplements you use. Also tell them if you smoke, drink alcohol, or use illegal drugs. Some items may interact with your medicine. What should I watch for while using this medicine? Visit your doctor for checks on your progress. This drug may make you feel generally unwell. This is not uncommon, as chemotherapy can affect healthy cells as well as cancer cells. Report any side effects. Continue your course of treatment even though you feel ill unless your doctor tells you to stop. In some cases, you may be given additional medicines to help with side  effects. Follow all directions for their use. Call your doctor or health care professional for advice if you get a fever, chills or sore throat, or other symptoms of a cold or flu. Do not treat yourself. This drug decreases your body's ability to fight infections. Try to avoid being around people who are sick. This medicine may increase your risk to bruise or bleed. Call your doctor or health care professional if you notice any unusual bleeding. Be careful brushing and flossing your teeth or using a toothpick because you may get an infection or bleed more easily. If you have any dental work done, tell your dentist you are receiving this medicine. Avoid taking products that contain aspirin, acetaminophen, ibuprofen, naproxen, or ketoprofen unless instructed by your doctor. These medicines may hide a fever. Do not become pregnant while taking this medicine. Women should inform their   doctor if they wish to become pregnant or think they might be pregnant. There is a potential for serious side effects to an unborn child. Talk to your health care professional or pharmacist for more information. Do not breast-feed an infant while taking this medicine. Men should inform their doctor if they wish to father a child. This medicine may lower sperm counts. Do not treat diarrhea with over the counter products. Contact your doctor if you have diarrhea that lasts more than 2 days or if it is severe and watery. This medicine can make you more sensitive to the sun. Keep out of the sun. If you cannot avoid being in the sun, wear protective clothing and use sunscreen. Do not use sun lamps or tanning beds/booths. What side effects may I notice from receiving this medicine? Side effects that you should report to your doctor or health care professional as soon as possible:  allergic reactions like skin rash, itching or hives, swelling of the face, lips, or tongue  low blood counts - this medicine may decrease the number of  white blood cells, red blood cells and platelets. You may be at increased risk for infections and bleeding.  signs of infection - fever or chills, cough, sore throat, pain or difficulty passing urine  signs of decreased platelets or bleeding - bruising, pinpoint red spots on the skin, black, tarry stools, blood in the urine  signs of decreased red blood cells - unusually weak or tired, fainting spells, lightheadedness  breathing problems  changes in vision  chest pain  mouth sores  nausea and vomiting  pain, swelling, redness at site where injected  pain, tingling, numbness in the hands or feet  redness, swelling, or sores on hands or feet  stomach pain  unusual bleeding Side effects that usually do not require medical attention (report to your doctor or health care professional if they continue or are bothersome):  changes in finger or toe nails  diarrhea  dry or itchy skin  hair loss  headache  loss of appetite  sensitivity of eyes to the light  stomach upset  unusually teary eyes This list may not describe all possible side effects. Call your doctor for medical advice about side effects. You may report side effects to FDA at 1-800-FDA-1088. Where should I keep my medicine? This drug is given in a hospital or clinic and will not be stored at home. NOTE: This sheet is a summary. It may not cover all possible information. If you have questions about this medicine, talk to your doctor, pharmacist, or health care provider.  2020 Elsevier/Gold Standard (2007-09-12 13:53:16) [facility] Discharge Instructions for Patients Receiving Chemotherapy  Today you received the following chemotherapy agents: Oxaliplatin, leucovorin, 5FU  To help prevent nausea and vomiting after your treatment, we encourage you to take your nausea medication as directed.    If you develop nausea and vomiting that is not controlled by your nausea medication, call the clinic.    BELOW ARE SYMPTOMS THAT SHOULD BE REPORTED IMMEDIATELY:  *FEVER GREATER THAN 100.5 F  *CHILLS WITH OR WITHOUT FEVER  NAUSEA AND VOMITING THAT IS NOT CONTROLLED WITH YOUR NAUSEA MEDICATION  *UNUSUAL SHORTNESS OF BREATH  *UNUSUAL BRUISING OR BLEEDING  TENDERNESS IN MOUTH AND THROAT WITH OR WITHOUT PRESENCE OF ULCERS  *URINARY PROBLEMS  *BOWEL PROBLEMS  UNUSUAL RASH Items with * indicate a potential emergency and should be followed up as soon as possible.  Feel free to call the clinic should  you have any questions or concerns. The clinic phone number is [phone].  Please show the [facility] at check-in to the Emergency Department and triage nurse.

## 2019-09-05 NOTE — Progress Notes (Signed)
Met w/ pt and went over the [employer], gave him the income requirement and an expense sheet.  Pt would like to apply so he will bring his proof of income some time next week.  He has my card for any questions or concerns he may have in the future.

## 2019-09-07 ENCOUNTER — Other Ambulatory Visit: Payer: Self-pay

## 2019-09-07 ENCOUNTER — Inpatient Hospital Stay: Payer: Medicare Other

## 2019-09-07 VITALS — BP 143/76 | HR 53 | Temp 99.1°F | Resp 20

## 2019-09-07 DIAGNOSIS — C20 Malignant neoplasm of rectum: Secondary | ICD-10-CM

## 2019-09-07 DIAGNOSIS — Z79899 Other long term (current) drug therapy: Secondary | ICD-10-CM | POA: Diagnosis not present

## 2019-09-07 DIAGNOSIS — G629 Polyneuropathy, unspecified: Secondary | ICD-10-CM | POA: Diagnosis not present

## 2019-09-07 DIAGNOSIS — Z5111 Encounter for antineoplastic chemotherapy: Secondary | ICD-10-CM | POA: Diagnosis not present

## 2019-09-07 DIAGNOSIS — R112 Nausea with vomiting, unspecified: Secondary | ICD-10-CM | POA: Diagnosis not present

## 2019-09-07 MED ORDER — HEPARIN SOD (PORK) LOCK FLUSH 100 UNIT/ML IV SOLN
500.0000 [IU] | Freq: Once | INTRAVENOUS | Status: AC | PRN
  Administered 2019-09-07: 500 [IU]
  Filled 2019-09-07: qty 5

## 2019-09-07 MED ORDER — SODIUM CHLORIDE 0.9% FLUSH
10.0000 mL | INTRAVENOUS | Status: DC | PRN
  Administered 2019-09-07: 10 mL
  Filled 2019-09-07: qty 10

## 2019-09-11 NOTE — Progress Notes (Signed)
Pharmacist Chemotherapy Monitoring - Follow Up Assessment    I verify that I have reviewed each item in the below checklist:  . Regimen for the patient is scheduled for the appropriate day and plan matches scheduled date. Marland Kitchen Appropriate non-routine labs are ordered dependent on drug ordered. . If applicable, additional medications reviewed and ordered per protocol based on lifetime cumulative doses and/or treatment regimen.   Plan for follow-up and/or issues identified: No . I-vent associated with next due treatment: No . MD and/or nursing notified: No  Jared White D 09/11/2019 4:15 PM

## 2019-09-17 ENCOUNTER — Inpatient Hospital Stay: Payer: Medicare Other

## 2019-09-17 ENCOUNTER — Other Ambulatory Visit: Payer: Self-pay

## 2019-09-17 ENCOUNTER — Inpatient Hospital Stay (HOSPITAL_BASED_OUTPATIENT_CLINIC_OR_DEPARTMENT_OTHER): Payer: Medicare Other | Admitting: Oncology

## 2019-09-17 VITALS — BP 150/87 | HR 65 | Temp 98.0°F | Resp 17 | Ht 68.0 in | Wt 168.2 lb

## 2019-09-17 DIAGNOSIS — Z95828 Presence of other vascular implants and grafts: Secondary | ICD-10-CM

## 2019-09-17 DIAGNOSIS — C20 Malignant neoplasm of rectum: Secondary | ICD-10-CM | POA: Diagnosis not present

## 2019-09-17 DIAGNOSIS — C189 Malignant neoplasm of colon, unspecified: Secondary | ICD-10-CM | POA: Diagnosis not present

## 2019-09-17 DIAGNOSIS — Z5111 Encounter for antineoplastic chemotherapy: Secondary | ICD-10-CM | POA: Diagnosis not present

## 2019-09-17 DIAGNOSIS — G629 Polyneuropathy, unspecified: Secondary | ICD-10-CM | POA: Diagnosis not present

## 2019-09-17 DIAGNOSIS — R112 Nausea with vomiting, unspecified: Secondary | ICD-10-CM | POA: Diagnosis not present

## 2019-09-17 DIAGNOSIS — Z79899 Other long term (current) drug therapy: Secondary | ICD-10-CM | POA: Diagnosis not present

## 2019-09-17 LAB — CBC WITH DIFFERENTIAL (CANCER CENTER ONLY)
Abs Immature Granulocytes: 0 10*3/uL (ref 0.00–0.07)
Basophils Absolute: 0 10*3/uL (ref 0.0–0.1)
Basophils Relative: 1 %
Eosinophils Absolute: 0.3 10*3/uL (ref 0.0–0.5)
Eosinophils Relative: 6 %
HCT: 40 % (ref 39.0–52.0)
Hemoglobin: 13.1 g/dL (ref 13.0–17.0)
Immature Granulocytes: 0 %
Lymphocytes Relative: 22 %
Lymphs Abs: 1 10*3/uL (ref 0.7–4.0)
MCH: 30.5 pg (ref 26.0–34.0)
MCHC: 32.8 g/dL (ref 30.0–36.0)
MCV: 93.2 fL (ref 80.0–100.0)
Monocytes Absolute: 0.3 10*3/uL (ref 0.1–1.0)
Monocytes Relative: 8 %
Neutro Abs: 2.9 10*3/uL (ref 1.7–7.7)
Neutrophils Relative %: 63 %
Platelet Count: 84 10*3/uL — ABNORMAL LOW (ref 150–400)
RBC: 4.29 MIL/uL (ref 4.22–5.81)
RDW: 12.8 % (ref 11.5–15.5)
WBC Count: 4.6 10*3/uL (ref 4.0–10.5)
nRBC: 0 % (ref 0.0–0.2)

## 2019-09-17 LAB — CMP (CANCER CENTER ONLY)
ALT: 19 U/L (ref 0–44)
AST: 19 U/L (ref 15–41)
Albumin: 3.2 g/dL — ABNORMAL LOW (ref 3.5–5.0)
Alkaline Phosphatase: 73 U/L (ref 38–126)
Anion gap: 8 (ref 5–15)
BUN: 23 mg/dL (ref 8–23)
CO2: 24 mmol/L (ref 22–32)
Calcium: 8.5 mg/dL — ABNORMAL LOW (ref 8.9–10.3)
Chloride: 109 mmol/L (ref 98–111)
Creatinine: 1.51 mg/dL — ABNORMAL HIGH (ref 0.61–1.24)
GFR, Est AFR Am: 52 mL/min — ABNORMAL LOW (ref >60)
GFR, Estimated: 45 mL/min — ABNORMAL LOW (ref >60)
Glucose, Bld: 163 mg/dL — ABNORMAL HIGH (ref 70–99)
Potassium: 3.9 mmol/L (ref 3.5–5.1)
Sodium: 141 mmol/L (ref 135–145)
Total Bilirubin: 0.3 mg/dL (ref 0.3–1.2)
Total Protein: 6.4 g/dL — ABNORMAL LOW (ref 6.5–8.1)

## 2019-09-17 LAB — CEA (IN HOUSE-CHCC): CEA (CHCC-In House): 1.88 ng/mL (ref 0.00–5.00)

## 2019-09-17 MED ORDER — PALONOSETRON HCL INJECTION 0.25 MG/5ML
0.2500 mg | Freq: Once | INTRAVENOUS | Status: AC
  Administered 2019-09-17: 0.25 mg via INTRAVENOUS

## 2019-09-17 MED ORDER — SODIUM CHLORIDE 0.9 % IV SOLN
10.0000 mg | Freq: Once | INTRAVENOUS | Status: AC
  Administered 2019-09-17: 10 mg via INTRAVENOUS
  Filled 2019-09-17: qty 10

## 2019-09-17 MED ORDER — OXALIPLATIN CHEMO INJECTION 100 MG/20ML
85.0000 mg/m2 | Freq: Once | INTRAVENOUS | Status: AC
  Administered 2019-09-17: 165 mg via INTRAVENOUS
  Filled 2019-09-17: qty 33

## 2019-09-17 MED ORDER — DEXTROSE 5 % IV SOLN
Freq: Once | INTRAVENOUS | Status: AC
  Filled 2019-09-17: qty 250

## 2019-09-17 MED ORDER — SODIUM CHLORIDE 0.9 % IV SOLN
2400.0000 mg/m2 | INTRAVENOUS | Status: DC
  Administered 2019-09-17: 4650 mg via INTRAVENOUS
  Filled 2019-09-17: qty 93

## 2019-09-17 MED ORDER — SODIUM CHLORIDE 0.9% FLUSH
10.0000 mL | INTRAVENOUS | Status: DC | PRN
  Administered 2019-09-17: 10 mL via INTRAVENOUS
  Filled 2019-09-17: qty 10

## 2019-09-17 MED ORDER — FLUOROURACIL CHEMO INJECTION 2.5 GM/50ML
400.0000 mg/m2 | Freq: Once | INTRAVENOUS | Status: AC
  Administered 2019-09-17: 750 mg via INTRAVENOUS
  Filled 2019-09-17: qty 15

## 2019-09-17 MED ORDER — PALONOSETRON HCL INJECTION 0.25 MG/5ML
INTRAVENOUS | Status: AC
  Filled 2019-09-17: qty 5

## 2019-09-17 MED ORDER — LEUCOVORIN CALCIUM INJECTION 350 MG
400.0000 mg/m2 | Freq: Once | INTRAVENOUS | Status: AC
  Administered 2019-09-17: 772 mg via INTRAVENOUS
  Filled 2019-09-17: qty 38.6

## 2019-09-17 NOTE — Patient Instructions (Signed)
[facility] Discharge Instructions for Patients Receiving Chemotherapy  Today you received the following chemotherapy agents Oxaliplatin, Leucovorin, 5FU  To help prevent nausea and vomiting after your treatment, we encourage you to take your nausea medication as directed   If you develop nausea and vomiting that is not controlled by your nausea medication, call the clinic.   BELOW ARE SYMPTOMS THAT SHOULD BE REPORTED IMMEDIATELY:  *FEVER GREATER THAN 100.5 F  *CHILLS WITH OR WITHOUT FEVER  NAUSEA AND VOMITING THAT IS NOT CONTROLLED WITH YOUR NAUSEA MEDICATION  *UNUSUAL SHORTNESS OF BREATH  *UNUSUAL BRUISING OR BLEEDING  TENDERNESS IN MOUTH AND THROAT WITH OR WITHOUT PRESENCE OF ULCERS  *URINARY PROBLEMS  *BOWEL PROBLEMS  UNUSUAL RASH Items with * indicate a potential emergency and should be followed up as soon as possible.  Feel free to call the clinic should you have any questions or concerns. The clinic phone number is [phone].  Please show the CHEMO ALERT CARD at check-in to the Emergency Department and triage nurse.   

## 2019-09-17 NOTE — Progress Notes (Signed)
Hematology and Oncology Follow Up Visit  Jared White [id number] 1945-07-09 74 y.o. 09/17/2019 9:24 AM Jared White, MDGreene, [name], MD   Principle Diagnosis: 74 year old man with T3N2 rectal cancer diagnosed in March 2021.     Prior Therapy: He is status post colonoscopy and endoscopy completed on August 13, 2019.  Rectal biopsy showed an invasive adenocarcinoma.  Current therapy: Neoadjuvant chemotherapy utilizing FOLFOX started on September 05 2019.  He is here for cycle 2 of therapy.  Interim History: [name] returns today for a follow-up visit.  Since the last visit, he received the first cycle of chemotherapy without any major complications.  He denies any nausea, vomiting or worsening neuropathy.  He does report some cold sensitivity associated with cold exposure.  He denies any abdominal pain discomfort.  He does report some difficulty moving his bowels at times but is compensating by predominantly eating liquid diet.  He denies any recent hospitalizations or illnesses.    Medications: Updated on review. Current Outpatient Medications  Medication Sig Dispense Refill  . aspirin EC 81 MG tablet Take 1 tablet (81 mg total) by mouth daily. 90 tablet 3  . atenolol (TENORMIN) 50 MG tablet TAKE 1 TABLET BY MOUTH EVERY DAY 90 tablet 2  . calcium carbonate (OSCAL) 1500 (600 Ca) MG TABS tablet Take by mouth daily.    . clotrimazole-betamethasone (LOTRISONE) cream Apply 1 application topically 2 (two) times daily. (Patient not taking: Reported on 08/13/2019) 30 g 0  . famotidine (PEPCID) 20 MG tablet Take 1 tablet (20 mg total) by mouth 2 (two) times daily. 60 tablet 5  . fluticasone (FLONASE) 50 MCG/ACT nasal spray INSTILL 2 SPRAYS INTO EACH NOSTRIL EVERY DAY (Patient not taking: Reported on 08/13/2019) 48 g 2  . lidocaine-prilocaine (EMLA) cream Apply 1 application topically as needed. 30 g 0  . Magnesium Oxide 400 MG CAPS Take 1 capsule (400 mg total) by mouth 2 (two) times daily. 60  capsule 0  . Omega-3 Fatty Acids (OMEGA 3 PO) Take 520 mg by mouth daily.     Marland Kitchen omeprazole (PRILOSEC) 40 MG capsule Take 1 capsule (40 mg total) by mouth 2 (two) times daily. 90 capsule 3  . prochlorperazine (COMPAZINE) 10 MG tablet Take 1 tablet (10 mg total) by mouth every 6 (six) hours as needed for nausea or vomiting. 30 tablet 0  . rosuvastatin (CRESTOR) 20 MG tablet TAKE 1 TABLET BY MOUTH EVERY DAY 90 tablet 2  . valsartan (DIOVAN) 160 MG tablet TAKE 1 TABLET BY MOUTH ONE TIME DAILY. 90 tablet 2  . vitamin B-12 (CYANOCOBALAMIN) 1000 MCG tablet Take 1,000 mcg by mouth daily.     No current facility-administered medications for this visit.   Facility-Administered Medications Ordered in Other Visits  Medication Dose Route Frequency Provider Last Rate Last Admin  . sodium chloride flush (NS) 0.9 % injection 10 mL  10 mL Intravenous PRN Jared Portela, MD   10 mL at 09/17/19 0909     Allergies: No Known Allergies    Physical Exam: Blood pressure (!) 150/87, pulse 65, temperature 98 F (36.7 C), temperature source Temporal, resp. rate 17, height 5\' 8"  (1.727 m), weight 168 lb 3.2 oz (76.3 kg), SpO2 100 %.    ECOG: 1   General appearance: Comfortable appearing without any discomfort Head: Normocephalic without any trauma Oropharynx: Mucous membranes are moist and pink without any thrush or ulcers. Eyes: Pupils are equal and round reactive to light. Lymph nodes: No cervical,  supraclavicular, inguinal or axillary lymphadenopathy.   Heart:regular rate and rhythm.  S1 and S2 without leg edema. Lung: Clear without any rhonchi or wheezes.  No dullness to percussion. Abdomin: Soft, nontender, nondistended with good bowel sounds.  No hepatosplenomegaly. Musculoskeletal: No joint deformity or effusion.  Full range of motion noted. Neurological: No deficits noted on motor, sensory and deep tendon reflex exam. Skin: No rashes or lesions.    Lab Results: Lab Results  Component Value  Date   WBC 8.4 09/05/2019   HGB 14.0 09/05/2019   HCT 42.8 09/05/2019   MCV 92.2 09/05/2019   PLT 153 09/05/2019     Chemistry      Component Value Date/Time   NA 140 09/05/2019 1120   NA 140 12/09/2016 0932   K 4.0 09/05/2019 1120   CL 109 09/05/2019 1120   CO2 24 09/05/2019 1120   BUN 22 09/05/2019 1120   BUN 29 (H) 12/09/2016 0932   CREATININE 1.52 (H) 09/05/2019 1120   CREATININE 1.61 (H) 01/21/2016 1030      Component Value Date/Time   CALCIUM 8.8 (L) 09/05/2019 1120   CALCIUM 7.0 (L) 10/09/2015 0458   ALKPHOS 75 09/05/2019 1120   AST 16 09/05/2019 1120   ALT 13 09/05/2019 1120   BILITOT 0.4 09/05/2019 1120      Impression and Plan:  74 year old man with:  1.  Stage III rectal cancer after presenting with a T3N2 disease confirmed in March 2021.    He is currently receiving neoadjuvant chemotherapy utilizing FOLFOX.  The plan is to administer 4 months of therapy for a total of 8 cycles of possible prior to proceeding with radiation and Xeloda to complete total therapy.  Surgical resection may be needed upon completing this neoadjuvant therapy.  He is agreeable to proceed at this time.   2.  IV access: Port-A-Cath inserted without any complications at this time.  3.  Antiemetics: Nausea or vomiting reported at this time.  Exam is available to him.  4.  Neuropathy: No issues reported with oxaliplatin but will continue to monitor closely for subsequent cycles.  5.  Follow-up: 2 weeks for the next cycle of therapy.  30  minutes were spent on this encounter.  The time was dedicated to updating his disease status, treatment options and addressing complication lytic therapy.    Jared Button, MD 4/27/20219:24 AM

## 2019-09-17 NOTE — Progress Notes (Signed)
Per Dr. Shadad, OK to treat with today's labs.  

## 2019-09-19 ENCOUNTER — Inpatient Hospital Stay: Payer: Medicare Other

## 2019-09-19 ENCOUNTER — Other Ambulatory Visit: Payer: Self-pay

## 2019-09-19 VITALS — BP 152/69 | HR 60 | Temp 98.6°F | Resp 18

## 2019-09-19 DIAGNOSIS — C20 Malignant neoplasm of rectum: Secondary | ICD-10-CM

## 2019-09-19 DIAGNOSIS — Z5111 Encounter for antineoplastic chemotherapy: Secondary | ICD-10-CM | POA: Diagnosis not present

## 2019-09-19 DIAGNOSIS — R112 Nausea with vomiting, unspecified: Secondary | ICD-10-CM | POA: Diagnosis not present

## 2019-09-19 DIAGNOSIS — G629 Polyneuropathy, unspecified: Secondary | ICD-10-CM | POA: Diagnosis not present

## 2019-09-19 DIAGNOSIS — Z79899 Other long term (current) drug therapy: Secondary | ICD-10-CM | POA: Diagnosis not present

## 2019-09-19 MED ORDER — SODIUM CHLORIDE 0.9% FLUSH
10.0000 mL | INTRAVENOUS | Status: DC | PRN
  Administered 2019-09-19: 10 mL via INTRAVENOUS
  Filled 2019-09-19: qty 10

## 2019-09-19 MED ORDER — SODIUM CHLORIDE 0.9% FLUSH
3.0000 mL | INTRAVENOUS | Status: DC | PRN
  Filled 2019-09-19: qty 10

## 2019-09-19 MED ORDER — HEPARIN SOD (PORK) LOCK FLUSH 100 UNIT/ML IV SOLN
500.0000 [IU] | Freq: Once | INTRAVENOUS | Status: AC | PRN
  Administered 2019-09-19: 500 [IU]
  Filled 2019-09-19: qty 5

## 2019-09-23 ENCOUNTER — Encounter: Payer: Self-pay | Admitting: Oncology

## 2019-09-23 NOTE — Progress Notes (Signed)
Pt is approved for the $1000 [employer] grant.  

## 2019-09-26 NOTE — Progress Notes (Signed)
Pharmacist Chemotherapy Monitoring - Follow Up Assessment    I verify that I have reviewed each item in the below checklist:  . Regimen for the patient is scheduled for the appropriate day and plan matches scheduled date. Marland Kitchen Appropriate non-routine labs are ordered dependent on drug ordered. . If applicable, additional medications reviewed and ordered per protocol based on lifetime cumulative doses and/or treatment regimen.   Plan for follow-up and/or issues identified: No . I-vent associated with next due treatment: No . MD and/or nursing notified: No  Britt Boozer 09/26/2019 2:27 PM

## 2019-10-02 ENCOUNTER — Inpatient Hospital Stay: Payer: Medicare Other

## 2019-10-02 ENCOUNTER — Other Ambulatory Visit: Payer: Self-pay

## 2019-10-02 ENCOUNTER — Inpatient Hospital Stay: Payer: Medicare Other | Attending: Oncology | Admitting: Oncology

## 2019-10-02 ENCOUNTER — Telehealth: Payer: Self-pay

## 2019-10-02 VITALS — BP 172/80 | HR 64 | Temp 98.2°F | Resp 18

## 2019-10-02 DIAGNOSIS — T451X5A Adverse effect of antineoplastic and immunosuppressive drugs, initial encounter: Secondary | ICD-10-CM | POA: Diagnosis not present

## 2019-10-02 DIAGNOSIS — Z5111 Encounter for antineoplastic chemotherapy: Secondary | ICD-10-CM | POA: Diagnosis not present

## 2019-10-02 DIAGNOSIS — G629 Polyneuropathy, unspecified: Secondary | ICD-10-CM | POA: Diagnosis not present

## 2019-10-02 DIAGNOSIS — D6959 Other secondary thrombocytopenia: Secondary | ICD-10-CM | POA: Diagnosis not present

## 2019-10-02 DIAGNOSIS — R55 Syncope and collapse: Secondary | ICD-10-CM | POA: Insufficient documentation

## 2019-10-02 DIAGNOSIS — D701 Agranulocytosis secondary to cancer chemotherapy: Secondary | ICD-10-CM | POA: Insufficient documentation

## 2019-10-02 DIAGNOSIS — Z79899 Other long term (current) drug therapy: Secondary | ICD-10-CM | POA: Diagnosis not present

## 2019-10-02 DIAGNOSIS — Z95828 Presence of other vascular implants and grafts: Secondary | ICD-10-CM

## 2019-10-02 DIAGNOSIS — R5383 Other fatigue: Secondary | ICD-10-CM | POA: Diagnosis not present

## 2019-10-02 DIAGNOSIS — Z5189 Encounter for other specified aftercare: Secondary | ICD-10-CM | POA: Insufficient documentation

## 2019-10-02 DIAGNOSIS — Z9221 Personal history of antineoplastic chemotherapy: Secondary | ICD-10-CM | POA: Diagnosis not present

## 2019-10-02 DIAGNOSIS — C20 Malignant neoplasm of rectum: Secondary | ICD-10-CM

## 2019-10-02 LAB — CMP (CANCER CENTER ONLY)
ALT: 28 U/L (ref 0–44)
AST: 26 U/L (ref 15–41)
Albumin: 3.2 g/dL — ABNORMAL LOW (ref 3.5–5.0)
Alkaline Phosphatase: 78 U/L (ref 38–126)
Anion gap: 9 (ref 5–15)
BUN: 16 mg/dL (ref 8–23)
CO2: 24 mmol/L (ref 22–32)
Calcium: 9 mg/dL (ref 8.9–10.3)
Chloride: 107 mmol/L (ref 98–111)
Creatinine: 1.44 mg/dL — ABNORMAL HIGH (ref 0.61–1.24)
GFR, Est AFR Am: 55 mL/min — ABNORMAL LOW (ref >60)
GFR, Estimated: 48 mL/min — ABNORMAL LOW (ref >60)
Glucose, Bld: 237 mg/dL — ABNORMAL HIGH (ref 70–99)
Potassium: 3.9 mmol/L (ref 3.5–5.1)
Sodium: 140 mmol/L (ref 135–145)
Total Bilirubin: 0.4 mg/dL (ref 0.3–1.2)
Total Protein: 6.3 g/dL — ABNORMAL LOW (ref 6.5–8.1)

## 2019-10-02 LAB — CBC WITH DIFFERENTIAL (CANCER CENTER ONLY)
Abs Immature Granulocytes: 0 10*3/uL (ref 0.00–0.07)
Basophils Absolute: 0 10*3/uL (ref 0.0–0.1)
Basophils Relative: 1 %
Eosinophils Absolute: 0.1 10*3/uL (ref 0.0–0.5)
Eosinophils Relative: 5 %
HCT: 37.9 % — ABNORMAL LOW (ref 39.0–52.0)
Hemoglobin: 12.8 g/dL — ABNORMAL LOW (ref 13.0–17.0)
Immature Granulocytes: 0 %
Lymphocytes Relative: 58 %
Lymphs Abs: 1 10*3/uL (ref 0.7–4.0)
MCH: 30.7 pg (ref 26.0–34.0)
MCHC: 33.8 g/dL (ref 30.0–36.0)
MCV: 90.9 fL (ref 80.0–100.0)
Monocytes Absolute: 0.3 10*3/uL (ref 0.1–1.0)
Monocytes Relative: 16 %
Neutro Abs: 0.3 10*3/uL — CL (ref 1.7–7.7)
Neutrophils Relative %: 20 %
Platelet Count: 54 10*3/uL — ABNORMAL LOW (ref 150–400)
RBC: 4.17 MIL/uL — ABNORMAL LOW (ref 4.22–5.81)
RDW: 13.7 % (ref 11.5–15.5)
WBC Count: 1.8 10*3/uL — ABNORMAL LOW (ref 4.0–10.5)
nRBC: 0 % (ref 0.0–0.2)

## 2019-10-02 LAB — CEA (IN HOUSE-CHCC): CEA (CHCC-In House): 2.14 ng/mL (ref 0.00–5.00)

## 2019-10-02 MED ORDER — SODIUM CHLORIDE 0.9% FLUSH
10.0000 mL | Freq: Once | INTRAVENOUS | Status: AC
  Administered 2019-10-02: 10 mL via INTRAVENOUS
  Filled 2019-10-02: qty 10

## 2019-10-02 MED ORDER — SODIUM CHLORIDE 0.9% FLUSH
10.0000 mL | INTRAVENOUS | Status: DC | PRN
  Administered 2019-10-02: 10 mL via INTRAVENOUS
  Filled 2019-10-02: qty 10

## 2019-10-02 MED ORDER — HEPARIN SOD (PORK) LOCK FLUSH 100 UNIT/ML IV SOLN
500.0000 [IU] | Freq: Once | INTRAVENOUS | Status: AC
  Administered 2019-10-02: 11:00:00 500 [IU] via INTRAVENOUS
  Filled 2019-10-02: qty 5

## 2019-10-02 NOTE — Telephone Encounter (Signed)
CRITICAL RESULT: ANC 0.3  RECEIVED FROM: Rolland Porter in the lab  TIME RECEIVED: 1053  Dr. Alen Blew notified at 1054. Per Dr. Alen Blew no chemo treatment today. Infusion RN made aware. Patient's port de accessed.

## 2019-10-02 NOTE — Progress Notes (Signed)
Hematology and Oncology Follow Up Visit  Jared White [id number] 07/06/1945 74 y.o. 10/02/2019 11:07 AM Jared White, MDGreene, [name], MD   Principle Diagnosis: 74 year old man with rectal cancer diagnosed in March 2021.  He presented with T3N2 and pelvic lymph nodes.     Prior Therapy: He is status post colonoscopy and endoscopy completed on August 13, 2019.  Rectal biopsy showed an invasive adenocarcinoma.  Current therapy: Neoadjuvant chemotherapy utilizing FOLFOX started on September 05 2019.  He is here for cycle 3 of therapy.  Interim History: [name] presents today for a return lesion.  Since the last visit, he received the second cycle of chemotherapy with few complications.  He is reporting more cold intolerance and grade 1 sensory neuropathy that has resolved.  He does report some mild fatigue and tiredness but no other complaints.  Denies any abdominal pain, hematochezia or melena.  He denies any hematuria or dysuria.    Medications: Unchanged on review. Current Outpatient Medications  Medication Sig Dispense Refill  . aspirin EC 81 MG tablet Take 1 tablet (81 mg total) by mouth daily. 90 tablet 3  . atenolol (TENORMIN) 50 MG tablet TAKE 1 TABLET BY MOUTH EVERY DAY 90 tablet 2  . calcium carbonate (OSCAL) 1500 (600 Ca) MG TABS tablet Take by mouth daily.    . clotrimazole-betamethasone (LOTRISONE) cream Apply 1 application topically 2 (two) times daily. (Patient not taking: Reported on 08/13/2019) 30 g 0  . famotidine (PEPCID) 20 MG tablet Take 1 tablet (20 mg total) by mouth 2 (two) times daily. 60 tablet 5  . fluticasone (FLONASE) 50 MCG/ACT nasal spray INSTILL 2 SPRAYS INTO EACH NOSTRIL EVERY DAY (Patient not taking: Reported on 08/13/2019) 48 g 2  . lidocaine-prilocaine (EMLA) cream Apply 1 application topically as needed. 30 g 0  . Magnesium Oxide 400 MG CAPS Take 1 capsule (400 mg total) by mouth 2 (two) times daily. 60 capsule 0  . Omega-3 Fatty Acids (OMEGA 3 PO) Take  520 mg by mouth daily.     Marland Kitchen omeprazole (PRILOSEC) 40 MG capsule Take 1 capsule (40 mg total) by mouth 2 (two) times daily. 90 capsule 3  . prochlorperazine (COMPAZINE) 10 MG tablet Take 1 tablet (10 mg total) by mouth every 6 (six) hours as needed for nausea or vomiting. 30 tablet 0  . rosuvastatin (CRESTOR) 20 MG tablet TAKE 1 TABLET BY MOUTH EVERY DAY 90 tablet 2  . valsartan (DIOVAN) 160 MG tablet TAKE 1 TABLET BY MOUTH ONE TIME DAILY. 90 tablet 2  . vitamin B-12 (CYANOCOBALAMIN) 1000 MCG tablet Take 1,000 mcg by mouth daily.     No current facility-administered medications for this visit.     Allergies: No Known Allergies    Physical Exam: Blood pressure (!) 172/80, pulse 64, temperature 98.2 F (36.8 C), temperature source Temporal, resp. rate 18, SpO2 95 %.    ECOG: 1    General appearance: Alert, awake without any distress. Head: Atraumatic without abnormalities Oropharynx: Without any thrush or ulcers. Eyes: No scleral icterus. Lymph nodes: No lymphadenopathy noted in the cervical, supraclavicular, or axillary nodes Heart:regular rate and rhythm, without any murmurs or gallops.   Lung: Clear to auscultation without any rhonchi, wheezes or dullness to percussion. Abdomin: Soft, nontender without any shifting dullness or ascites. Musculoskeletal: No clubbing or cyanosis. Neurological: No motor or sensory deficits. Skin: No rashes or lesions.     Lab Results: Lab Results  Component Value Date   WBC 1.8 (  L) 10/02/2019   HGB 12.8 (L) 10/02/2019   HCT 37.9 (L) 10/02/2019   MCV 90.9 10/02/2019   PLT 54 (L) 10/02/2019     Chemistry      Component Value Date/Time   NA 141 09/17/2019 0827   NA 140 12/09/2016 0932   K 3.9 09/17/2019 0827   CL 109 09/17/2019 0827   CO2 24 09/17/2019 0827   BUN 23 09/17/2019 0827   BUN 29 (H) 12/09/2016 0932   CREATININE 1.51 (H) 09/17/2019 0827   CREATININE 1.61 (H) 01/21/2016 1030      Component Value Date/Time   CALCIUM  8.5 (L) 09/17/2019 0827   CALCIUM 7.0 (L) 10/09/2015 0458   ALKPHOS 73 09/17/2019 0827   AST 19 09/17/2019 0827   ALT 19 09/17/2019 0827   BILITOT 0.3 09/17/2019 0827      Impression and Plan:  74 year old man with:  1.  Rectal cancer presented with a pelvic mass found to have T3N2 disease in March 2021.   He completed 2 cycles of FOLFOX chemotherapy with few complaints.  He is experiencing cold sensitivity and mild neuropathy and worsening cytopenias.  He has developed neutropenia thrombocytopenia.  Risks and benefits of continuing this treatment were reviewed today and he is agreeable to continue.  Chemotherapy will be delayed for 2 weeks and will be resumed at a reduced dose of oxaliplatin by 25%.  The plan is to complete 4 months of therapy.    2.  IV access: Port-A-Cath currently in use without any issues.  3.  Antiemetics: No nausea or vomiting reported at this time.  4.  Neuropathy: Very faint which I anticipate improvement with dose reduction of oxaliplatin.  5.  Neutropenia: Related to oxaliplatin.  We will add growth factor support after each cycle of therapy.  6.  Thrombocytopenia: Related to oxaliplatin.  No active bleeding noted and dose reduction and delay of chemotherapy today should help.  7.  Follow-up: He will return in 2 weeks for cycle 3 of therapy.  30  minutes were dedicated to this visit.  The time was spent on reviewing laboratory data, addressing complications related to therapy and future plan of care discussion.    [name], MD 5/12/202111:07 AM

## 2019-10-02 NOTE — Patient Instructions (Signed)

## 2019-10-03 ENCOUNTER — Telehealth: Payer: Self-pay | Admitting: Oncology

## 2019-10-03 NOTE — Telephone Encounter (Signed)
Scheduled appt per 5/12.

## 2019-10-04 ENCOUNTER — Inpatient Hospital Stay: Payer: Medicare Other

## 2019-10-05 DIAGNOSIS — C189 Malignant neoplasm of colon, unspecified: Secondary | ICD-10-CM | POA: Diagnosis not present

## 2019-10-09 ENCOUNTER — Other Ambulatory Visit: Payer: Self-pay | Admitting: Oncology

## 2019-10-09 NOTE — Progress Notes (Signed)
Pharmacist Chemotherapy Monitoring - Follow Up Assessment    I verify that I have reviewed each item in the below checklist:  . Regimen for the patient is scheduled for the appropriate day and plan matches scheduled date. Marland Kitchen Appropriate non-routine labs are ordered dependent on drug ordered. . If applicable, additional medications reviewed and ordered per protocol based on lifetime cumulative doses and/or treatment regimen.   Plan for follow-up and/or issues identified: Yes . I-vent associated with next due treatment: Yes . MD and/or nursing notified: Yes  Philomena Course 10/09/2019 1:04 PM

## 2019-10-15 ENCOUNTER — Inpatient Hospital Stay: Payer: Medicare Other

## 2019-10-15 ENCOUNTER — Inpatient Hospital Stay (HOSPITAL_BASED_OUTPATIENT_CLINIC_OR_DEPARTMENT_OTHER): Payer: Medicare Other | Admitting: Oncology

## 2019-10-15 ENCOUNTER — Other Ambulatory Visit: Payer: Self-pay

## 2019-10-15 VITALS — BP 145/75 | HR 61 | Temp 97.8°F | Resp 20 | Ht 68.0 in | Wt 168.0 lb

## 2019-10-15 DIAGNOSIS — Z9221 Personal history of antineoplastic chemotherapy: Secondary | ICD-10-CM | POA: Diagnosis not present

## 2019-10-15 DIAGNOSIS — C20 Malignant neoplasm of rectum: Secondary | ICD-10-CM | POA: Diagnosis not present

## 2019-10-15 DIAGNOSIS — C189 Malignant neoplasm of colon, unspecified: Secondary | ICD-10-CM | POA: Diagnosis not present

## 2019-10-15 DIAGNOSIS — D701 Agranulocytosis secondary to cancer chemotherapy: Secondary | ICD-10-CM | POA: Diagnosis not present

## 2019-10-15 DIAGNOSIS — G629 Polyneuropathy, unspecified: Secondary | ICD-10-CM | POA: Diagnosis not present

## 2019-10-15 DIAGNOSIS — Z95828 Presence of other vascular implants and grafts: Secondary | ICD-10-CM

## 2019-10-15 DIAGNOSIS — R5383 Other fatigue: Secondary | ICD-10-CM | POA: Diagnosis not present

## 2019-10-15 DIAGNOSIS — D6959 Other secondary thrombocytopenia: Secondary | ICD-10-CM | POA: Diagnosis not present

## 2019-10-15 DIAGNOSIS — Z5189 Encounter for other specified aftercare: Secondary | ICD-10-CM | POA: Diagnosis not present

## 2019-10-15 DIAGNOSIS — Z79899 Other long term (current) drug therapy: Secondary | ICD-10-CM | POA: Diagnosis not present

## 2019-10-15 DIAGNOSIS — R55 Syncope and collapse: Secondary | ICD-10-CM | POA: Diagnosis not present

## 2019-10-15 DIAGNOSIS — T451X5A Adverse effect of antineoplastic and immunosuppressive drugs, initial encounter: Secondary | ICD-10-CM | POA: Diagnosis not present

## 2019-10-15 DIAGNOSIS — Z5111 Encounter for antineoplastic chemotherapy: Secondary | ICD-10-CM | POA: Diagnosis not present

## 2019-10-15 LAB — CBC WITH DIFFERENTIAL (CANCER CENTER ONLY)
Abs Immature Granulocytes: 0.03 10*3/uL (ref 0.00–0.07)
Basophils Absolute: 0.1 10*3/uL (ref 0.0–0.1)
Basophils Relative: 1 %
Eosinophils Absolute: 0.2 10*3/uL (ref 0.0–0.5)
Eosinophils Relative: 3 %
HCT: 39.8 % (ref 39.0–52.0)
Hemoglobin: 13.1 g/dL (ref 13.0–17.0)
Immature Granulocytes: 1 %
Lymphocytes Relative: 24 %
Lymphs Abs: 1.4 10*3/uL (ref 0.7–4.0)
MCH: 30.8 pg (ref 26.0–34.0)
MCHC: 32.9 g/dL (ref 30.0–36.0)
MCV: 93.6 fL (ref 80.0–100.0)
Monocytes Absolute: 0.5 10*3/uL (ref 0.1–1.0)
Monocytes Relative: 8 %
Neutro Abs: 3.7 10*3/uL (ref 1.7–7.7)
Neutrophils Relative %: 63 %
Platelet Count: 127 10*3/uL — ABNORMAL LOW (ref 150–400)
RBC: 4.25 MIL/uL (ref 4.22–5.81)
RDW: 15.6 % — ABNORMAL HIGH (ref 11.5–15.5)
WBC Count: 5.8 10*3/uL (ref 4.0–10.5)
nRBC: 0 % (ref 0.0–0.2)

## 2019-10-15 LAB — CMP (CANCER CENTER ONLY)
ALT: 36 U/L (ref 0–44)
AST: 31 U/L (ref 15–41)
Albumin: 3.4 g/dL — ABNORMAL LOW (ref 3.5–5.0)
Alkaline Phosphatase: 76 U/L (ref 38–126)
Anion gap: 8 (ref 5–15)
BUN: 28 mg/dL — ABNORMAL HIGH (ref 8–23)
CO2: 23 mmol/L (ref 22–32)
Calcium: 9 mg/dL (ref 8.9–10.3)
Chloride: 109 mmol/L (ref 98–111)
Creatinine: 1.69 mg/dL — ABNORMAL HIGH (ref 0.61–1.24)
GFR, Est AFR Am: 46 mL/min — ABNORMAL LOW (ref >60)
GFR, Estimated: 39 mL/min — ABNORMAL LOW (ref >60)
Glucose, Bld: 161 mg/dL — ABNORMAL HIGH (ref 70–99)
Potassium: 4.4 mmol/L (ref 3.5–5.1)
Sodium: 140 mmol/L (ref 135–145)
Total Bilirubin: 0.3 mg/dL (ref 0.3–1.2)
Total Protein: 6.5 g/dL (ref 6.5–8.1)

## 2019-10-15 LAB — CEA (IN HOUSE-CHCC): CEA (CHCC-In House): 2.11 ng/mL (ref 0.00–5.00)

## 2019-10-15 MED ORDER — SODIUM CHLORIDE 0.9% FLUSH
10.0000 mL | INTRAVENOUS | Status: DC | PRN
  Administered 2019-10-15: 10 mL via INTRAVENOUS
  Filled 2019-10-15: qty 10

## 2019-10-15 MED ORDER — OXALIPLATIN CHEMO INJECTION 100 MG/20ML
64.0000 mg/m2 | Freq: Once | INTRAVENOUS | Status: AC
  Administered 2019-10-15: 125 mg via INTRAVENOUS
  Filled 2019-10-15: qty 25

## 2019-10-15 MED ORDER — PALONOSETRON HCL INJECTION 0.25 MG/5ML
INTRAVENOUS | Status: AC
  Filled 2019-10-15: qty 5

## 2019-10-15 MED ORDER — FLUOROURACIL CHEMO INJECTION 2.5 GM/50ML
400.0000 mg/m2 | Freq: Once | INTRAVENOUS | Status: AC
  Administered 2019-10-15: 750 mg via INTRAVENOUS
  Filled 2019-10-15: qty 15

## 2019-10-15 MED ORDER — SODIUM CHLORIDE 0.9 % IV SOLN
2400.0000 mg/m2 | INTRAVENOUS | Status: DC
  Administered 2019-10-15: 4650 mg via INTRAVENOUS
  Filled 2019-10-15: qty 93

## 2019-10-15 MED ORDER — SODIUM CHLORIDE 0.9 % IV SOLN
10.0000 mg | Freq: Once | INTRAVENOUS | Status: AC
  Administered 2019-10-15: 10 mg via INTRAVENOUS
  Filled 2019-10-15: qty 1
  Filled 2019-10-15: qty 10

## 2019-10-15 MED ORDER — DEXTROSE 5 % IV SOLN
Freq: Once | INTRAVENOUS | Status: AC
  Filled 2019-10-15: qty 250

## 2019-10-15 MED ORDER — PALONOSETRON HCL INJECTION 0.25 MG/5ML
0.2500 mg | Freq: Once | INTRAVENOUS | Status: AC
  Administered 2019-10-15: 0.25 mg via INTRAVENOUS

## 2019-10-15 MED ORDER — LEUCOVORIN CALCIUM INJECTION 350 MG
400.0000 mg/m2 | Freq: Once | INTRAVENOUS | Status: AC
  Administered 2019-10-15: 772 mg via INTRAVENOUS
  Filled 2019-10-15: qty 38.6

## 2019-10-15 NOTE — Patient Instructions (Signed)
[facility] Discharge Instructions for Patients Receiving Chemotherapy  Today you received the following chemotherapy agents Oxaliplatin, Leucovorin, 5FU  To help prevent nausea and vomiting after your treatment, we encourage you to take your nausea medication as directed   If you develop nausea and vomiting that is not controlled by your nausea medication, call the clinic.   BELOW ARE SYMPTOMS THAT SHOULD BE REPORTED IMMEDIATELY:  *FEVER GREATER THAN 100.5 F  *CHILLS WITH OR WITHOUT FEVER  NAUSEA AND VOMITING THAT IS NOT CONTROLLED WITH YOUR NAUSEA MEDICATION  *UNUSUAL SHORTNESS OF BREATH  *UNUSUAL BRUISING OR BLEEDING  TENDERNESS IN MOUTH AND THROAT WITH OR WITHOUT PRESENCE OF ULCERS  *URINARY PROBLEMS  *BOWEL PROBLEMS  UNUSUAL RASH Items with * indicate a potential emergency and should be followed up as soon as possible.  Feel free to call the clinic should you have any questions or concerns. The clinic phone number is [phone].  Please show the CHEMO ALERT CARD at check-in to the Emergency Department and triage nurse.   

## 2019-10-15 NOTE — Progress Notes (Signed)
Hematology and Oncology Follow Up Visit  Jared White [id number] [birth year] 74 y.o. 10/15/2019 9:29 AM Jared White, MDGreene, [name], MD   Principle Diagnosis: 74 year old man with T3N2 rectal cancer diagnosed in March 2021.     Prior Therapy: He is status post colonoscopy and endoscopy completed on August 13, 2019.  Rectal biopsy showed an invasive adenocarcinoma.  Current therapy: Neoadjuvant chemotherapy utilizing FOLFOX started on September 05 2019.  He completed 2 cycles of therapy and here for cycle 3.  Interim History: [name] returns today for a follow-up visit.  Since the last visit, he reports overall health without any residual complications related to chemotherapy.  He denies any nausea vomiting or abdominal pain.  He denies any cold sensitivity or neuropathy.  He is reporting significant improvement in his bowel habits no longer reporting any pain or obstruction.  Denies any hematochezia or melena.    Medications: Reviewed without changes. Current Outpatient Medications  Medication Sig Dispense Refill  . aspirin EC 81 MG tablet Take 1 tablet (81 mg total) by mouth daily. 90 tablet 3  . atenolol (TENORMIN) 50 MG tablet TAKE 1 TABLET BY MOUTH EVERY DAY 90 tablet 2  . calcium carbonate (OSCAL) 1500 (600 Ca) MG TABS tablet Take by mouth daily.    . clotrimazole-betamethasone (LOTRISONE) cream Apply 1 application topically 2 (two) times daily. (Patient not taking: Reported on 08/13/2019) 30 g 0  . famotidine (PEPCID) 20 MG tablet Take 1 tablet (20 mg total) by mouth 2 (two) times daily. 60 tablet 5  . fluticasone (FLONASE) 50 MCG/ACT nasal spray INSTILL 2 SPRAYS INTO EACH NOSTRIL EVERY DAY (Patient not taking: Reported on 08/13/2019) 48 g 2  . lidocaine-prilocaine (EMLA) cream Apply 1 application topically as needed. 30 g 0  . Magnesium Oxide 400 MG CAPS Take 1 capsule (400 mg total) by mouth 2 (two) times daily. 60 capsule 0  . Omega-3 Fatty Acids (OMEGA 3 PO) Take 520 mg by  mouth daily.     Marland Kitchen omeprazole (PRILOSEC) 40 MG capsule Take 1 capsule (40 mg total) by mouth 2 (two) times daily. 90 capsule 3  . prochlorperazine (COMPAZINE) 10 MG tablet Take 1 tablet (10 mg total) by mouth every 6 (six) hours as needed for nausea or vomiting. 30 tablet 0  . rosuvastatin (CRESTOR) 20 MG tablet TAKE 1 TABLET BY MOUTH EVERY DAY 90 tablet 2  . valsartan (DIOVAN) 160 MG tablet TAKE 1 TABLET BY MOUTH ONE TIME DAILY. 90 tablet 2  . vitamin B-12 (CYANOCOBALAMIN) 1000 MCG tablet Take 1,000 mcg by mouth daily.     No current facility-administered medications for this visit.   Facility-Administered Medications Ordered in Other Visits  Medication Dose Route Frequency Provider Last Rate Last Admin  . sodium chloride flush (NS) 0.9 % injection 10 mL  10 mL Intravenous PRN Wyatt Portela, MD   10 mL at 10/15/19 [id number]     Allergies: No Known Allergies    Physical Exam:  Blood pressure (!) 145/75, pulse 61, temperature 97.8 F (36.6 C), temperature source Temporal, resp. rate 20, height 5\' 8"  (1.727 m), weight 168 lb (76.2 kg), SpO2 97 %.    ECOG: 1    General appearance: Comfortable appearing without any discomfort Head: Normocephalic without any trauma Oropharynx: Mucous membranes are moist and pink without any thrush or ulcers. Eyes: Pupils are equal and round reactive to light. Lymph nodes: No cervical, supraclavicular, inguinal or axillary lymphadenopathy.   Heart:regular rate and rhythm.  S1 and S2 without leg edema. Lung: Clear without any rhonchi or wheezes.  No dullness to percussion. Abdomin: Soft, nontender, nondistended with good bowel sounds.  No hepatosplenomegaly. Musculoskeletal: No joint deformity or effusion.  Full range of motion noted. Neurological: No deficits noted on motor, sensory and deep tendon reflex exam. Skin: No petechial rash or dryness.  Appeared moist.       Lab Results: Lab Results  Component Value Date   WBC 1.8 (L) 10/02/2019    HGB 12.8 (L) 10/02/2019   HCT 37.9 (L) 10/02/2019   MCV 90.9 10/02/2019   PLT 54 (L) 10/02/2019     Chemistry      Component Value Date/Time   NA 140 10/02/2019 1040   NA 140 12/09/2016 0932   K 3.9 10/02/2019 1040   CL 107 10/02/2019 1040   CO2 24 10/02/2019 1040   BUN 16 10/02/2019 1040   BUN 29 (H) 12/09/2016 0932   CREATININE 1.44 (H) 10/02/2019 1040   CREATININE 1.61 (H) 01/21/2016 1030      Component Value Date/Time   CALCIUM 9.0 10/02/2019 1040   CALCIUM 7.0 (L) 10/09/2015 0458   ALKPHOS 78 10/02/2019 1040   AST 26 10/02/2019 1040   ALT 28 10/02/2019 1040   BILITOT 0.4 10/02/2019 1040      Impression and Plan:  74 year old man with:  1.  T3N2 rectal cancer diagnosed in March 2021.   The natural course of this disease was reviewed at this time and treatment options were reiterated.  He is currently receiving chemotherapy utilizing FOLFOX with cycle 3 needed to be delayed because of cytopenias.  Risks and benefits of proceeding with cycle 3 were reviewed.  He is complications including neutropenia, thrombocytopenia and peripheral neuropathy.  He is agreeable to proceed and the plan is to tentatively complete 8 cycles of therapy.   2.  IV access: Port-A-Cath continues to be in use without any issues.  3.  Antiemetics: Antiemetics are available to him without any recent nausea or vomiting.  4.  Neuropathy: None reported at this time.  5.  Neutropenia: Oxaliplatin dose was reduced and he will receive growth factor support after each cycle of therapy.  Resolved at this time with normal white cell count.  6.  Thrombocytopenia: Active bleeding noted and this is related to chemotherapy.  Anticipate improvement with dose reduction of oxaliplatin.  His laboratory data showed a platelet count of 127 should be adequate to receive treatment.  7.  Follow-up: In 2 weeks for the next cycle of therapy.  30  minutes were spent on this encounter.  The time was dedicated to  updating his disease status, discussing treatment options, addressing complications related to therapy and future plan of care review.    [name], MD 5/25/20219:29 AM

## 2019-10-15 NOTE — Patient Instructions (Signed)

## 2019-10-15 NOTE — Progress Notes (Signed)
Ok to treat with Scr 1.69 per Dr Alen Blew.

## 2019-10-16 ENCOUNTER — Telehealth: Payer: Self-pay | Admitting: Oncology

## 2019-10-16 NOTE — Telephone Encounter (Signed)
Scheduled appt per 5/25 los. Pt will get an updated appt calendar at their next scheduled appt.

## 2019-10-17 ENCOUNTER — Inpatient Hospital Stay: Payer: Medicare Other

## 2019-10-17 ENCOUNTER — Other Ambulatory Visit: Payer: Self-pay

## 2019-10-17 VITALS — BP 127/73 | HR 66 | Temp 98.4°F | Resp 20

## 2019-10-17 DIAGNOSIS — C20 Malignant neoplasm of rectum: Secondary | ICD-10-CM

## 2019-10-17 DIAGNOSIS — T451X5A Adverse effect of antineoplastic and immunosuppressive drugs, initial encounter: Secondary | ICD-10-CM | POA: Diagnosis not present

## 2019-10-17 DIAGNOSIS — D6959 Other secondary thrombocytopenia: Secondary | ICD-10-CM | POA: Diagnosis not present

## 2019-10-17 DIAGNOSIS — R5383 Other fatigue: Secondary | ICD-10-CM | POA: Diagnosis not present

## 2019-10-17 DIAGNOSIS — D701 Agranulocytosis secondary to cancer chemotherapy: Secondary | ICD-10-CM | POA: Diagnosis not present

## 2019-10-17 DIAGNOSIS — Z5111 Encounter for antineoplastic chemotherapy: Secondary | ICD-10-CM | POA: Diagnosis not present

## 2019-10-17 DIAGNOSIS — R55 Syncope and collapse: Secondary | ICD-10-CM | POA: Diagnosis not present

## 2019-10-17 DIAGNOSIS — Z95828 Presence of other vascular implants and grafts: Secondary | ICD-10-CM

## 2019-10-17 DIAGNOSIS — Z9221 Personal history of antineoplastic chemotherapy: Secondary | ICD-10-CM | POA: Diagnosis not present

## 2019-10-17 DIAGNOSIS — Z79899 Other long term (current) drug therapy: Secondary | ICD-10-CM | POA: Diagnosis not present

## 2019-10-17 DIAGNOSIS — Z5189 Encounter for other specified aftercare: Secondary | ICD-10-CM | POA: Diagnosis not present

## 2019-10-17 DIAGNOSIS — G629 Polyneuropathy, unspecified: Secondary | ICD-10-CM | POA: Diagnosis not present

## 2019-10-17 MED ORDER — SODIUM CHLORIDE 0.9% FLUSH
10.0000 mL | INTRAVENOUS | Status: DC | PRN
  Administered 2019-10-17: 10 mL via INTRAVENOUS
  Filled 2019-10-17: qty 10

## 2019-10-17 MED ORDER — HEPARIN SOD (PORK) LOCK FLUSH 100 UNIT/ML IV SOLN
500.0000 [IU] | Freq: Once | INTRAVENOUS | Status: AC
  Administered 2019-10-17: 500 [IU] via INTRAVENOUS
  Filled 2019-10-17: qty 5

## 2019-10-17 MED ORDER — PEGFILGRASTIM-CBQV 6 MG/0.6ML ~~LOC~~ SOSY
6.0000 mg | PREFILLED_SYRINGE | Freq: Once | SUBCUTANEOUS | Status: AC
  Administered 2019-10-17: 6 mg via SUBCUTANEOUS

## 2019-10-17 MED ORDER — PEGFILGRASTIM-CBQV 6 MG/0.6ML ~~LOC~~ SOSY
PREFILLED_SYRINGE | SUBCUTANEOUS | Status: AC
  Filled 2019-10-17: qty 0.6

## 2019-10-17 NOTE — Patient Instructions (Signed)
Implanted Port Home Guide An implanted port is a device that is placed under the skin. It is usually placed in the chest. The device can be used to give IV medicine, to take blood, or for dialysis. You may have an implanted port if:  You need IV medicine that would be irritating to the small veins in your hands or arms.  You need IV medicines, such as antibiotics, for a long period of time.  You need IV nutrition for a long period of time.  You need dialysis. Having a port means that your health care provider will not need to use the veins in your arms for these procedures. You may have fewer limitations when using a port than you would if you used other types of long-term IVs, and you will likely be able to return to normal activities after your incision heals. An implanted port has two main parts:  Reservoir. The reservoir is the part where a needle is inserted to give medicines or draw blood. The reservoir is round. After it is placed, it appears as a small, raised area under your skin.  Catheter. The catheter is a thin, flexible tube that connects the reservoir to a vein. Medicine that is inserted into the reservoir goes into the catheter and then into the vein. How is my port accessed? To access your port:  A numbing cream may be placed on the skin over the port site.  Your health care provider will put on a mask and sterile gloves.  The skin over your port will be cleaned carefully with a germ-killing soap and allowed to dry.  Your health care provider will gently pinch the port and insert a needle into it.  Your health care provider will check for a blood return to make sure the port is in the vein and is not clogged.  If your port needs to remain accessed to get medicine continuously (constant infusion), your health care provider will place a clear bandage (dressing) over the needle site. The dressing and needle will need to be changed every week, or as told by your health care  provider. What is flushing? Flushing helps keep the port from getting clogged. Follow instructions from your health care provider about how and when to flush the port. Ports are usually flushed with saline solution or a medicine called heparin. The need for flushing will depend on how the port is used:  If the port is only used from time to time to give medicines or draw blood, the port may need to be flushed: ? Before and after medicines have been given. ? Before and after blood has been drawn. ? As part of routine maintenance. Flushing may be recommended every 4-6 weeks.  If a constant infusion is running, the port may not need to be flushed.  Throw away any syringes in a disposal container that is meant for sharp items (sharps container). You can buy a sharps container from a pharmacy, or you can make one by using an empty hard plastic bottle with a cover. How long will my port stay implanted? The port can stay in for as long as your health care provider thinks it is needed. When it is time for the port to come out, a surgery will be done to remove it. The surgery will be similar to the procedure that was done to put the port in. Follow these instructions at home:   Flush your port as told by your health care provider.    If you need an infusion over several days, follow instructions from your health care provider about how to take care of your port site. Make sure you: ? Wash your hands with soap and water before you change your dressing. If soap and water are not available, use alcohol-based hand sanitizer. ? Change your dressing as told by your health care provider. ? Place any used dressings or infusion bags into a plastic bag. Throw that bag in the trash. ? Keep the dressing that covers the needle clean and dry. Do not get it wet. ? Do not use scissors or sharp objects near the tube. ? Keep the tube clamped, unless it is being used.  Check your port site every day for signs of  infection. Check for: ? Redness, swelling, or pain. ? Fluid or blood. ? Pus or a bad smell.  Protect the skin around the port site. ? Avoid wearing bra straps that rub or irritate the site. ? Protect the skin around your port from seat belts. Place a soft pad over your chest if needed.  Bathe or shower as told by your health care provider. The site may get wet as long as you are not actively receiving an infusion.  Return to your normal activities as told by your health care provider. Ask your health care provider what activities are safe for you.  Carry a medical alert card or wear a medical alert bracelet at all times. This will let health care providers know that you have an implanted port in case of an emergency. Get help right away if:  You have redness, swelling, or pain at the port site.  You have fluid or blood coming from your port site.  You have pus or a bad smell coming from the port site.  You have a fever. Summary  Implanted ports are usually placed in the chest for long-term IV access.  Follow instructions from your health care provider about flushing the port and changing bandages (dressings).  Take care of the area around your port by avoiding clothing that puts pressure on the area, and by watching for signs of infection.  Protect the skin around your port from seat belts. Place a soft pad over your chest if needed.  Get help right away if you have a fever or you have redness, swelling, pain, drainage, or a bad smell at the port site. This information is not intended to replace advice given to you by your health care provider. Make sure you discuss any questions you have with your health care provider. Document Revised: 08/31/2018 Document Reviewed: 06/11/2016 Elsevier Patient Education  2020 [employer] Pegfilgrastim injection What is this medicine? PEGFILGRASTIM (PEG fil gra stim) is a long-acting granulocyte colony-stimulating factor that stimulates the  growth of neutrophils, a type of white blood cell important in the body's fight against infection. It is used to reduce the incidence of fever and infection in patients with certain types of cancer who are receiving chemotherapy that affects the bone marrow, and to increase survival after being exposed to high doses of radiation. This medicine may be used for other purposes; ask your health care provider or pharmacist if you have questions. COMMON BRAND NAME(S): Fulphila, Neulasta, UDENYCA, Ziextenzo What should I tell my health care provider before I take this medicine? They need to know if you have any of these conditions:  kidney disease  latex allergy  ongoing radiation therapy  sickle cell disease  skin reactions to acrylic adhesives (On-Body   Injector only)  an unusual or allergic reaction to pegfilgrastim, filgrastim, other medicines, foods, dyes, or preservatives  pregnant or trying to get pregnant  breast-feeding How should I use this medicine? This medicine is for injection under the skin. If you get this medicine at home, you will be taught how to prepare and give the pre-filled syringe or how to use the On-body Injector. Refer to the patient Instructions for Use for detailed instructions. Use exactly as directed. Tell your healthcare provider immediately if you suspect that the On-body Injector may not have performed as intended or if you suspect the use of the On-body Injector resulted in a missed or partial dose. It is important that you put your used needles and syringes in a special sharps container. Do not put them in a trash can. If you do not have a sharps container, call your pharmacist or healthcare provider to get one. Talk to your pediatrician regarding the use of this medicine in children. While this drug may be prescribed for selected conditions, precautions do apply. Overdosage: If you think you have taken too much of this medicine contact a poison control center or  emergency room at once. NOTE: This medicine is only for you. Do not share this medicine with others. What if I miss a dose? It is important not to miss your dose. Call your doctor or health care professional if you miss your dose. If you miss a dose due to an On-body Injector failure or leakage, a new dose should be administered as soon as possible using a single prefilled syringe for manual use. What may interact with this medicine? Interactions have not been studied. Give your health care provider a list of all the medicines, herbs, non-prescription drugs, or dietary supplements you use. Also tell them if you smoke, drink alcohol, or use illegal drugs. Some items may interact with your medicine. This list may not describe all possible interactions. Give your health care provider a list of all the medicines, herbs, non-prescription drugs, or dietary supplements you use. Also tell them if you smoke, drink alcohol, or use illegal drugs. Some items may interact with your medicine. What should I watch for while using this medicine? You may need blood work done while you are taking this medicine. If you are going to need a MRI, CT scan, or other procedure, tell your doctor that you are using this medicine (On-Body Injector only). What side effects may I notice from receiving this medicine? Side effects that you should report to your doctor or health care professional as soon as possible:  allergic reactions like skin rash, itching or hives, swelling of the face, lips, or tongue  back pain  dizziness  fever  pain, redness, or irritation at site where injected  pinpoint red spots on the skin  red or dark-brown urine  shortness of breath or breathing problems  stomach or side pain, or pain at the shoulder  swelling  tiredness  trouble passing urine or change in the amount of urine Side effects that usually do not require medical attention (report to your doctor or health care  professional if they continue or are bothersome):  bone pain  muscle pain This list may not describe all possible side effects. Call your doctor for medical advice about side effects. You may report side effects to FDA at 1-800-FDA-1088. Where should I keep my medicine? Keep out of the reach of children. If you are using this medicine at home, you will be instructed   on how to store it. Throw away any unused medicine after the expiration date on the label. NOTE: This sheet is a summary. It may not cover all possible information. If you have questions about this medicine, talk to your doctor, pharmacist, or health care provider.  2020 Elsevier/Gold Standard (2017-08-14 16:57:08)  

## 2019-10-24 NOTE — Progress Notes (Signed)
Pharmacist Chemotherapy Monitoring - Follow Up Assessment    I verify that I have reviewed each item in the below checklist:  . Regimen for the patient is scheduled for the appropriate day and plan matches scheduled date. Marland Kitchen Appropriate non-routine labs are ordered dependent on drug ordered. . If applicable, additional medications reviewed and ordered per protocol based on lifetime cumulative doses and/or treatment regimen.   Plan for follow-up and/or issues identified: No . I-vent associated with next due treatment: No . MD and/or nursing notified: No  Jared White K 10/24/2019 1:38 PM

## 2019-10-29 ENCOUNTER — Encounter: Payer: Self-pay | Admitting: Gastroenterology

## 2019-10-30 ENCOUNTER — Inpatient Hospital Stay (HOSPITAL_BASED_OUTPATIENT_CLINIC_OR_DEPARTMENT_OTHER): Payer: Medicare Other | Admitting: Oncology

## 2019-10-30 ENCOUNTER — Inpatient Hospital Stay: Payer: Medicare Other

## 2019-10-30 ENCOUNTER — Inpatient Hospital Stay: Payer: Medicare Other | Attending: Oncology

## 2019-10-30 ENCOUNTER — Other Ambulatory Visit: Payer: Self-pay

## 2019-10-30 VITALS — BP 165/90 | HR 62 | Temp 98.1°F | Resp 17 | Ht 68.0 in | Wt 165.8 lb

## 2019-10-30 DIAGNOSIS — Z5189 Encounter for other specified aftercare: Secondary | ICD-10-CM | POA: Insufficient documentation

## 2019-10-30 DIAGNOSIS — Z95828 Presence of other vascular implants and grafts: Secondary | ICD-10-CM

## 2019-10-30 DIAGNOSIS — G629 Polyneuropathy, unspecified: Secondary | ICD-10-CM | POA: Insufficient documentation

## 2019-10-30 DIAGNOSIS — D6959 Other secondary thrombocytopenia: Secondary | ICD-10-CM | POA: Insufficient documentation

## 2019-10-30 DIAGNOSIS — C20 Malignant neoplasm of rectum: Secondary | ICD-10-CM

## 2019-10-30 DIAGNOSIS — R55 Syncope and collapse: Secondary | ICD-10-CM | POA: Diagnosis not present

## 2019-10-30 DIAGNOSIS — Z79899 Other long term (current) drug therapy: Secondary | ICD-10-CM | POA: Insufficient documentation

## 2019-10-30 DIAGNOSIS — R5383 Other fatigue: Secondary | ICD-10-CM | POA: Insufficient documentation

## 2019-10-30 DIAGNOSIS — D709 Neutropenia, unspecified: Secondary | ICD-10-CM | POA: Insufficient documentation

## 2019-10-30 DIAGNOSIS — R590 Localized enlarged lymph nodes: Secondary | ICD-10-CM | POA: Insufficient documentation

## 2019-10-30 DIAGNOSIS — Z5111 Encounter for antineoplastic chemotherapy: Secondary | ICD-10-CM | POA: Diagnosis not present

## 2019-10-30 LAB — CBC WITH DIFFERENTIAL (CANCER CENTER ONLY)
Abs Immature Granulocytes: 1.55 10*3/uL — ABNORMAL HIGH (ref 0.00–0.07)
Basophils Absolute: 0.2 10*3/uL — ABNORMAL HIGH (ref 0.0–0.1)
Basophils Relative: 1 %
Eosinophils Absolute: 0.3 10*3/uL (ref 0.0–0.5)
Eosinophils Relative: 2 %
HCT: 39.3 % (ref 39.0–52.0)
Hemoglobin: 12.9 g/dL — ABNORMAL LOW (ref 13.0–17.0)
Immature Granulocytes: 10 %
Lymphocytes Relative: 12 %
Lymphs Abs: 1.9 10*3/uL (ref 0.7–4.0)
MCH: 30.6 pg (ref 26.0–34.0)
MCHC: 32.8 g/dL (ref 30.0–36.0)
MCV: 93.3 fL (ref 80.0–100.0)
Monocytes Absolute: 1.2 10*3/uL — ABNORMAL HIGH (ref 0.1–1.0)
Monocytes Relative: 7 %
Neutro Abs: 10.9 10*3/uL — ABNORMAL HIGH (ref 1.7–7.7)
Neutrophils Relative %: 68 %
Platelet Count: 74 10*3/uL — ABNORMAL LOW (ref 150–400)
RBC: 4.21 MIL/uL — ABNORMAL LOW (ref 4.22–5.81)
RDW: 15.7 % — ABNORMAL HIGH (ref 11.5–15.5)
WBC Count: 16 10*3/uL — ABNORMAL HIGH (ref 4.0–10.5)
nRBC: 0.2 % (ref 0.0–0.2)

## 2019-10-30 LAB — CMP (CANCER CENTER ONLY)
ALT: 23 U/L (ref 0–44)
AST: 25 U/L (ref 15–41)
Albumin: 3.3 g/dL — ABNORMAL LOW (ref 3.5–5.0)
Alkaline Phosphatase: 109 U/L (ref 38–126)
Anion gap: 11 (ref 5–15)
BUN: 17 mg/dL (ref 8–23)
CO2: 25 mmol/L (ref 22–32)
Calcium: 9.2 mg/dL (ref 8.9–10.3)
Chloride: 107 mmol/L (ref 98–111)
Creatinine: 1.67 mg/dL — ABNORMAL HIGH (ref 0.61–1.24)
GFR, Est AFR Am: 46 mL/min — ABNORMAL LOW (ref >60)
GFR, Estimated: 40 mL/min — ABNORMAL LOW (ref >60)
Glucose, Bld: 155 mg/dL — ABNORMAL HIGH (ref 70–99)
Potassium: 4.6 mmol/L (ref 3.5–5.1)
Sodium: 143 mmol/L (ref 135–145)
Total Bilirubin: 0.4 mg/dL (ref 0.3–1.2)
Total Protein: 6.7 g/dL (ref 6.5–8.1)

## 2019-10-30 LAB — CEA (IN HOUSE-CHCC): CEA (CHCC-In House): 2.38 ng/mL (ref 0.00–5.00)

## 2019-10-30 MED ORDER — PALONOSETRON HCL INJECTION 0.25 MG/5ML
INTRAVENOUS | Status: AC
  Filled 2019-10-30: qty 5

## 2019-10-30 MED ORDER — SODIUM CHLORIDE 0.9 % IV SOLN
2400.0000 mg/m2 | INTRAVENOUS | Status: DC
  Administered 2019-10-30: 4650 mg via INTRAVENOUS
  Filled 2019-10-30: qty 93

## 2019-10-30 MED ORDER — SODIUM CHLORIDE 0.9% FLUSH
10.0000 mL | INTRAVENOUS | Status: DC | PRN
  Administered 2019-10-30: 10 mL via INTRAVENOUS
  Filled 2019-10-30: qty 10

## 2019-10-30 MED ORDER — FLUOROURACIL CHEMO INJECTION 2.5 GM/50ML
400.0000 mg/m2 | Freq: Once | INTRAVENOUS | Status: AC
  Administered 2019-10-30: 750 mg via INTRAVENOUS
  Filled 2019-10-30: qty 15

## 2019-10-30 MED ORDER — DEXTROSE 5 % IV SOLN
Freq: Once | INTRAVENOUS | Status: AC
  Filled 2019-10-30: qty 250

## 2019-10-30 MED ORDER — LEUCOVORIN CALCIUM INJECTION 350 MG
400.0000 mg/m2 | Freq: Once | INTRAVENOUS | Status: AC
  Administered 2019-10-30: 772 mg via INTRAVENOUS
  Filled 2019-10-30: qty 38.6

## 2019-10-30 MED ORDER — LEUPROLIDE ACETATE (3 MONTH) 22.5 MG ~~LOC~~ KIT
PACK | SUBCUTANEOUS | Status: AC
  Filled 2019-10-30: qty 22.5

## 2019-10-30 MED ORDER — SODIUM CHLORIDE 0.9 % IV SOLN
10.0000 mg | Freq: Once | INTRAVENOUS | Status: AC
  Administered 2019-10-30: 10 mg via INTRAVENOUS
  Filled 2019-10-30: qty 10

## 2019-10-30 MED ORDER — OXALIPLATIN CHEMO INJECTION 100 MG/20ML
64.0000 mg/m2 | Freq: Once | INTRAVENOUS | Status: AC
  Administered 2019-10-30: 125 mg via INTRAVENOUS
  Filled 2019-10-30: qty 20

## 2019-10-30 MED ORDER — PALONOSETRON HCL INJECTION 0.25 MG/5ML
0.2500 mg | Freq: Once | INTRAVENOUS | Status: AC
  Administered 2019-10-30: 0.25 mg via INTRAVENOUS

## 2019-10-30 MED ORDER — ALTEPLASE 2 MG IJ SOLR
2.0000 mg | Freq: Once | INTRAMUSCULAR | Status: AC | PRN
  Administered 2019-10-30: 2 mg
  Filled 2019-10-30: qty 2

## 2019-10-30 MED ORDER — ALTEPLASE 2 MG IJ SOLR
INTRAMUSCULAR | Status: AC
  Filled 2019-10-30: qty 2

## 2019-10-30 NOTE — Progress Notes (Signed)
Per Dr Alen Blew, ok to treat with platelets 79 and creatinine 1.67

## 2019-10-30 NOTE — Patient Instructions (Signed)
[facility] Discharge Instructions for Patients Receiving Chemotherapy  Today you received the following chemotherapy agents: oxaliplatin, leucovorin, and fluorouracil.  To help prevent nausea and vomiting after your treatment, we encourage you to take your nausea medication as directed.   If you develop nausea and vomiting that is not controlled by your nausea medication, call the clinic.   BELOW ARE SYMPTOMS THAT SHOULD BE REPORTED IMMEDIATELY:  *FEVER GREATER THAN 100.5 F  *CHILLS WITH OR WITHOUT FEVER  NAUSEA AND VOMITING THAT IS NOT CONTROLLED WITH YOUR NAUSEA MEDICATION  *UNUSUAL SHORTNESS OF BREATH  *UNUSUAL BRUISING OR BLEEDING  TENDERNESS IN MOUTH AND THROAT WITH OR WITHOUT PRESENCE OF ULCERS  *URINARY PROBLEMS  *BOWEL PROBLEMS  UNUSUAL RASH Items with * indicate a potential emergency and should be followed up as soon as possible.  Feel free to call the clinic should you have any questions or concerns. The clinic phone number is [phone].  Please show the CHEMO ALERT CARD at check-in to the Emergency Department and triage nurse.   

## 2019-10-30 NOTE — Progress Notes (Signed)
Hematology and Oncology Follow Up Visit  Jared White [id number] August 26, 1945 74 y.o. 10/30/2019 9:32 AM Jared White, MDGreene, [name], MD   Principle Diagnosis: 74 year old man with rectal cancer diagnosed in March 2021.  He presented with pelvic adenopathy and T3N2 disease.   Prior Therapy: He is status post colonoscopy and endoscopy completed on August 13, 2019.  Rectal biopsy showed an invasive adenocarcinoma.  Current therapy: Neoadjuvant chemotherapy utilizing FOLFOX started on September 05 2019.  He is here for cycle 4 of therapy.  Interim History: Jared White resents today for a repeat evaluation.  Since the last visit, he reports no major complaints.  He denies any nausea vomiting or abdominal pain.  He denies any worsening neuropathy or diarrhea.  He is moving his bowels better at this time since the start of chemotherapy.    Medications: Updated on review. Current Outpatient Medications  Medication Sig Dispense Refill  . aspirin EC 81 MG tablet Take 1 tablet (81 mg total) by mouth daily. 90 tablet 3  . atenolol (TENORMIN) 50 MG tablet TAKE 1 TABLET BY MOUTH EVERY DAY 90 tablet 2  . calcium carbonate (OSCAL) 1500 (600 Ca) MG TABS tablet Take by mouth daily.    . clotrimazole-betamethasone (LOTRISONE) cream Apply 1 application topically 2 (two) times daily. (Patient not taking: Reported on 08/13/2019) 30 g 0  . famotidine (PEPCID) 20 MG tablet Take 1 tablet (20 mg total) by mouth 2 (two) times daily. 60 tablet 5  . fluticasone (FLONASE) 50 MCG/ACT nasal spray INSTILL 2 SPRAYS INTO EACH NOSTRIL EVERY DAY (Patient not taking: Reported on 08/13/2019) 48 g 2  . lidocaine-prilocaine (EMLA) cream Apply 1 application topically as needed. 30 g 0  . Magnesium Oxide 400 MG CAPS Take 1 capsule (400 mg total) by mouth 2 (two) times daily. 60 capsule 0  . Omega-3 Fatty Acids (OMEGA 3 PO) Take 520 mg by mouth daily.     Marland Kitchen omeprazole (PRILOSEC) 40 MG capsule Take 1 capsule (40 mg total) by mouth 2  (two) times daily. 90 capsule 3  . prochlorperazine (COMPAZINE) 10 MG tablet Take 1 tablet (10 mg total) by mouth every 6 (six) hours as needed for nausea or vomiting. 30 tablet 0  . rosuvastatin (CRESTOR) 20 MG tablet TAKE 1 TABLET BY MOUTH EVERY DAY 90 tablet 2  . valsartan (DIOVAN) 160 MG tablet TAKE 1 TABLET BY MOUTH ONE TIME DAILY. 90 tablet 2  . vitamin B-12 (CYANOCOBALAMIN) 1000 MCG tablet Take 1,000 mcg by mouth daily.     No current facility-administered medications for this visit.   Facility-Administered Medications Ordered in Other Visits  Medication Dose Route Frequency Provider Last Rate Last Admin  . sodium chloride flush (NS) 0.9 % injection 10 mL  10 mL Intravenous PRN Wyatt Portela, MD   10 mL at 10/30/19 0857     Allergies: No Known Allergies    Physical Exam:   Blood pressure (!) 165/90, pulse 62, temperature 98.1 F (36.7 C), temperature source Temporal, resp. rate 17, height 5\' 8"  (1.727 m), weight 165 lb 12.8 oz (75.2 kg), SpO2 98 %.    ECOG: 1   General appearance: Alert, awake without any distress. Head: Atraumatic without abnormalities Oropharynx: Without any thrush or ulcers. Eyes: No scleral icterus. Lymph nodes: No lymphadenopathy noted in the cervical, supraclavicular, or axillary nodes Heart:regular rate and rhythm, without any murmurs or gallops.   Lung: Clear to auscultation without any rhonchi, wheezes or dullness to percussion. Abdomin:  Soft, nontender without any shifting dullness or ascites. Musculoskeletal: No clubbing or cyanosis. Neurological: No motor or sensory deficits. Skin: No rashes or lesions.       Lab Results: Lab Results  Component Value Date   WBC 5.8 10/15/2019   HGB 13.1 10/15/2019   HCT 39.8 10/15/2019   MCV 93.6 10/15/2019   PLT 127 (L) 10/15/2019     Chemistry      Component Value Date/Time   NA 140 10/15/2019 0920   NA 140 12/09/2016 0932   K 4.4 10/15/2019 0920   CL 109 10/15/2019 0920   CO2 23  10/15/2019 0920   BUN 28 (H) 10/15/2019 0920   BUN 29 (H) 12/09/2016 0932   CREATININE 1.69 (H) 10/15/2019 0920   CREATININE 1.61 (H) 01/21/2016 1030      Component Value Date/Time   CALCIUM 9.0 10/15/2019 0920   CALCIUM 7.0 (L) 10/09/2015 0458   ALKPHOS 76 10/15/2019 0920   AST 31 10/15/2019 0920   ALT 36 10/15/2019 0920   BILITOT 0.3 10/15/2019 0920        Impression and Plan:  74 year old man with:  1.  Rectal cancer presented with pelvic adenopathy in March 2021.  He was found to have T3N2 disease.   He is currently receiving neoadjuvant chemotherapy with FOLFOX without any major complications.  Risks and benefits of proceeding with cycle 4 of chemotherapy were reviewed today.  Potential complications include myelosuppression as well as worsening neuropathy and cold sensitivity were reiterated.  He has tolerated this treatment better with the dose adjustments and ready to proceed.  The plan is to complete 8 cycles of therapy if it can be completed.   2.  IV access: Port-A-Cath currently in use although with some difficulty accessing blood today.  Cathflo has been inserted.  3.  Antiemetics: No nausea or vomiting reported at this time.  Antiemetics are available to him.  4.  Neuropathy: Very faint and transient with oxaliplatin.  No need for any additional adjustment dose medications.  5.  Neutropenia: Improved after oxaliplatin dose reduction.  We will continue to need growth factor support.  6.  Thrombocytopenia: Related to chemotherapy and improved with dose adjustment.  7.  Follow-up: He will return in 2 weeks for the next cycle of therapy.  30  minutes were dedicated to this visit.  The time was spent on reviewing laboratory data, discussing treatment options, addressing complications related to therapy and future plan of care.    [name], MD 6/9/20219:32 AM

## 2019-10-31 ENCOUNTER — Telehealth: Payer: Self-pay | Admitting: Oncology

## 2019-10-31 NOTE — Telephone Encounter (Signed)
Scheduled appt per 6/9 los.  Pt will get an updated appt calendar at there next scheduled appt.

## 2019-11-01 ENCOUNTER — Other Ambulatory Visit: Payer: Self-pay

## 2019-11-01 ENCOUNTER — Inpatient Hospital Stay: Payer: Medicare Other

## 2019-11-01 DIAGNOSIS — Z5111 Encounter for antineoplastic chemotherapy: Secondary | ICD-10-CM | POA: Diagnosis not present

## 2019-11-01 DIAGNOSIS — G629 Polyneuropathy, unspecified: Secondary | ICD-10-CM | POA: Diagnosis not present

## 2019-11-01 DIAGNOSIS — R590 Localized enlarged lymph nodes: Secondary | ICD-10-CM | POA: Diagnosis not present

## 2019-11-01 DIAGNOSIS — C20 Malignant neoplasm of rectum: Secondary | ICD-10-CM | POA: Diagnosis not present

## 2019-11-01 DIAGNOSIS — D6959 Other secondary thrombocytopenia: Secondary | ICD-10-CM | POA: Diagnosis not present

## 2019-11-01 DIAGNOSIS — Z79899 Other long term (current) drug therapy: Secondary | ICD-10-CM | POA: Diagnosis not present

## 2019-11-01 DIAGNOSIS — R55 Syncope and collapse: Secondary | ICD-10-CM | POA: Diagnosis not present

## 2019-11-01 DIAGNOSIS — D709 Neutropenia, unspecified: Secondary | ICD-10-CM | POA: Diagnosis not present

## 2019-11-01 DIAGNOSIS — Z5189 Encounter for other specified aftercare: Secondary | ICD-10-CM | POA: Diagnosis not present

## 2019-11-01 DIAGNOSIS — R5383 Other fatigue: Secondary | ICD-10-CM | POA: Diagnosis not present

## 2019-11-01 MED ORDER — SODIUM CHLORIDE 0.9% FLUSH
10.0000 mL | INTRAVENOUS | Status: DC | PRN
  Administered 2019-11-01: 10 mL
  Filled 2019-11-01: qty 10

## 2019-11-01 MED ORDER — PEGFILGRASTIM-CBQV 6 MG/0.6ML ~~LOC~~ SOSY
PREFILLED_SYRINGE | SUBCUTANEOUS | Status: AC
  Filled 2019-11-01: qty 0.6

## 2019-11-01 MED ORDER — HEPARIN SOD (PORK) LOCK FLUSH 100 UNIT/ML IV SOLN
500.0000 [IU] | Freq: Once | INTRAVENOUS | Status: AC | PRN
  Administered 2019-11-01: 500 [IU]
  Filled 2019-11-01: qty 5

## 2019-11-01 MED ORDER — PEGFILGRASTIM-CBQV 6 MG/0.6ML ~~LOC~~ SOSY
6.0000 mg | PREFILLED_SYRINGE | Freq: Once | SUBCUTANEOUS | Status: AC
  Administered 2019-11-01: 6 mg via SUBCUTANEOUS

## 2019-11-01 NOTE — Patient Instructions (Signed)
Implanted Port Home Guide An implanted port is a device that is placed under the skin. It is usually placed in the chest. The device can be used to give IV medicine, to take blood, or for dialysis. You may have an implanted port if:  You need IV medicine that would be irritating to the small veins in your hands or arms.  You need IV medicines, such as antibiotics, for a long period of time.  You need IV nutrition for a long period of time.  You need dialysis. Having a port means that your health care provider will not need to use the veins in your arms for these procedures. You may have fewer limitations when using a port than you would if you used other types of long-term IVs, and you will likely be able to return to normal activities after your incision heals. An implanted port has two main parts:  Reservoir. The reservoir is the part where a needle is inserted to give medicines or draw blood. The reservoir is round. After it is placed, it appears as a small, raised area under your skin.  Catheter. The catheter is a thin, flexible tube that connects the reservoir to a vein. Medicine that is inserted into the reservoir goes into the catheter and then into the vein. How is my port accessed? To access your port:  A numbing cream may be placed on the skin over the port site.  Your health care provider will put on a mask and sterile gloves.  The skin over your port will be cleaned carefully with a germ-killing soap and allowed to dry.  Your health care provider will gently pinch the port and insert a needle into it.  Your health care provider will check for a blood return to make sure the port is in the vein and is not clogged.  If your port needs to remain accessed to get medicine continuously (constant infusion), your health care provider will place a clear bandage (dressing) over the needle site. The dressing and needle will need to be changed every week, or as told by your health care  provider. What is flushing? Flushing helps keep the port from getting clogged. Follow instructions from your health care provider about how and when to flush the port. Ports are usually flushed with saline solution or a medicine called heparin. The need for flushing will depend on how the port is used:  If the port is only used from time to time to give medicines or draw blood, the port may need to be flushed: ? Before and after medicines have been given. ? Before and after blood has been drawn. ? As part of routine maintenance. Flushing may be recommended every 4-6 weeks.  If a constant infusion is running, the port may not need to be flushed.  Throw away any syringes in a disposal container that is meant for sharp items (sharps container). You can buy a sharps container from a pharmacy, or you can make one by using an empty hard plastic bottle with a cover. How long will my port stay implanted? The port can stay in for as long as your health care provider thinks it is needed. When it is time for the port to come out, a surgery will be done to remove it. The surgery will be similar to the procedure that was done to put the port in. Follow these instructions at home:   Flush your port as told by your health care provider.    If you need an infusion over several days, follow instructions from your health care provider about how to take care of your port site. Make sure you: ? Wash your hands with soap and water before you change your dressing. If soap and water are not available, use alcohol-based hand sanitizer. ? Change your dressing as told by your health care provider. ? Place any used dressings or infusion bags into a plastic bag. Throw that bag in the trash. ? Keep the dressing that covers the needle clean and dry. Do not get it wet. ? Do not use scissors or sharp objects near the tube. ? Keep the tube clamped, unless it is being used.  Check your port site every day for signs of  infection. Check for: ? Redness, swelling, or pain. ? Fluid or blood. ? Pus or a bad smell.  Protect the skin around the port site. ? Avoid wearing bra straps that rub or irritate the site. ? Protect the skin around your port from seat belts. Place a soft pad over your chest if needed.  Bathe or shower as told by your health care provider. The site may get wet as long as you are not actively receiving an infusion.  Return to your normal activities as told by your health care provider. Ask your health care provider what activities are safe for you.  Carry a medical alert card or wear a medical alert bracelet at all times. This will let health care providers know that you have an implanted port in case of an emergency. Get help right away if:  You have redness, swelling, or pain at the port site.  You have fluid or blood coming from your port site.  You have pus or a bad smell coming from the port site.  You have a fever. Summary  Implanted ports are usually placed in the chest for long-term IV access.  Follow instructions from your health care provider about flushing the port and changing bandages (dressings).  Take care of the area around your port by avoiding clothing that puts pressure on the area, and by watching for signs of infection.  Protect the skin around your port from seat belts. Place a soft pad over your chest if needed.  Get help right away if you have a fever or you have redness, swelling, pain, drainage, or a bad smell at the port site. This information is not intended to replace advice given to you by your health care provider. Make sure you discuss any questions you have with your health care provider. Document Revised: 08/31/2018 Document Reviewed: 06/11/2016 Elsevier Patient Education  2020 [employer] Pegfilgrastim injection What is this medicine? PEGFILGRASTIM (PEG fil gra stim) is a long-acting granulocyte colony-stimulating factor that stimulates the  growth of neutrophils, a type of white blood cell important in the body's fight against infection. It is used to reduce the incidence of fever and infection in patients with certain types of cancer who are receiving chemotherapy that affects the bone marrow, and to increase survival after being exposed to high doses of radiation. This medicine may be used for other purposes; ask your health care provider or pharmacist if you have questions. COMMON BRAND NAME(S): Fulphila, Neulasta, UDENYCA, Ziextenzo What should I tell my health care provider before I take this medicine? They need to know if you have any of these conditions:  kidney disease  latex allergy  ongoing radiation therapy  sickle cell disease  skin reactions to acrylic adhesives (On-Body   Injector only)  an unusual or allergic reaction to pegfilgrastim, filgrastim, other medicines, foods, dyes, or preservatives  pregnant or trying to get pregnant  breast-feeding How should I use this medicine? This medicine is for injection under the skin. If you get this medicine at home, you will be taught how to prepare and give the pre-filled syringe or how to use the On-body Injector. Refer to the patient Instructions for Use for detailed instructions. Use exactly as directed. Tell your healthcare provider immediately if you suspect that the On-body Injector may not have performed as intended or if you suspect the use of the On-body Injector resulted in a missed or partial dose. It is important that you put your used needles and syringes in a special sharps container. Do not put them in a trash can. If you do not have a sharps container, call your pharmacist or healthcare provider to get one. Talk to your pediatrician regarding the use of this medicine in children. While this drug may be prescribed for selected conditions, precautions do apply. Overdosage: If you think you have taken too much of this medicine contact a poison control center or  emergency room at once. NOTE: This medicine is only for you. Do not share this medicine with others. What if I miss a dose? It is important not to miss your dose. Call your doctor or health care professional if you miss your dose. If you miss a dose due to an On-body Injector failure or leakage, a new dose should be administered as soon as possible using a single prefilled syringe for manual use. What may interact with this medicine? Interactions have not been studied. Give your health care provider a list of all the medicines, herbs, non-prescription drugs, or dietary supplements you use. Also tell them if you smoke, drink alcohol, or use illegal drugs. Some items may interact with your medicine. This list may not describe all possible interactions. Give your health care provider a list of all the medicines, herbs, non-prescription drugs, or dietary supplements you use. Also tell them if you smoke, drink alcohol, or use illegal drugs. Some items may interact with your medicine. What should I watch for while using this medicine? You may need blood work done while you are taking this medicine. If you are going to need a MRI, CT scan, or other procedure, tell your doctor that you are using this medicine (On-Body Injector only). What side effects may I notice from receiving this medicine? Side effects that you should report to your doctor or health care professional as soon as possible:  allergic reactions like skin rash, itching or hives, swelling of the face, lips, or tongue  back pain  dizziness  fever  pain, redness, or irritation at site where injected  pinpoint red spots on the skin  red or dark-brown urine  shortness of breath or breathing problems  stomach or side pain, or pain at the shoulder  swelling  tiredness  trouble passing urine or change in the amount of urine Side effects that usually do not require medical attention (report to your doctor or health care  professional if they continue or are bothersome):  bone pain  muscle pain This list may not describe all possible side effects. Call your doctor for medical advice about side effects. You may report side effects to FDA at 1-800-FDA-1088. Where should I keep my medicine? Keep out of the reach of children. If you are using this medicine at home, you will be instructed   on how to store it. Throw away any unused medicine after the expiration date on the label. NOTE: This sheet is a summary. It may not cover all possible information. If you have questions about this medicine, talk to your doctor, pharmacist, or health care provider.  2020 Elsevier/Gold Standard (2017-08-14 16:57:08)  

## 2019-11-05 DIAGNOSIS — C189 Malignant neoplasm of colon, unspecified: Secondary | ICD-10-CM | POA: Diagnosis not present

## 2019-11-06 NOTE — Progress Notes (Signed)
Pharmacist Chemotherapy Monitoring - Follow Up Assessment    I verify that I have reviewed each item in the below checklist:  . Regimen for the patient is scheduled for the appropriate day and plan matches scheduled date. Marland Kitchen Appropriate non-routine labs are ordered dependent on drug ordered. . If applicable, additional medications reviewed and ordered per protocol based on lifetime cumulative doses and/or treatment regimen.   Plan for follow-up and/or issues identified: No . I-vent associated with next due treatment: No . MD and/or nursing notified: No   Kennith Center, Pharm.D., CPP 11/06/2019@2 :36 PM

## 2019-11-12 ENCOUNTER — Ambulatory Visit: Payer: Medicare Other

## 2019-11-12 ENCOUNTER — Inpatient Hospital Stay: Payer: Medicare Other

## 2019-11-12 ENCOUNTER — Inpatient Hospital Stay (HOSPITAL_BASED_OUTPATIENT_CLINIC_OR_DEPARTMENT_OTHER): Payer: Medicare Other | Admitting: Oncology

## 2019-11-12 ENCOUNTER — Other Ambulatory Visit: Payer: Self-pay

## 2019-11-12 VITALS — BP 127/66 | HR 64 | Temp 97.8°F | Resp 18 | Ht 68.0 in | Wt 162.0 lb

## 2019-11-12 DIAGNOSIS — D709 Neutropenia, unspecified: Secondary | ICD-10-CM | POA: Diagnosis not present

## 2019-11-12 DIAGNOSIS — R55 Syncope and collapse: Secondary | ICD-10-CM | POA: Diagnosis not present

## 2019-11-12 DIAGNOSIS — C189 Malignant neoplasm of colon, unspecified: Secondary | ICD-10-CM | POA: Diagnosis not present

## 2019-11-12 DIAGNOSIS — R5383 Other fatigue: Secondary | ICD-10-CM | POA: Diagnosis not present

## 2019-11-12 DIAGNOSIS — C20 Malignant neoplasm of rectum: Secondary | ICD-10-CM

## 2019-11-12 DIAGNOSIS — Z5111 Encounter for antineoplastic chemotherapy: Secondary | ICD-10-CM | POA: Diagnosis not present

## 2019-11-12 DIAGNOSIS — Z79899 Other long term (current) drug therapy: Secondary | ICD-10-CM | POA: Diagnosis not present

## 2019-11-12 DIAGNOSIS — Z95828 Presence of other vascular implants and grafts: Secondary | ICD-10-CM

## 2019-11-12 DIAGNOSIS — D6959 Other secondary thrombocytopenia: Secondary | ICD-10-CM | POA: Diagnosis not present

## 2019-11-12 DIAGNOSIS — Z5189 Encounter for other specified aftercare: Secondary | ICD-10-CM | POA: Diagnosis not present

## 2019-11-12 DIAGNOSIS — R590 Localized enlarged lymph nodes: Secondary | ICD-10-CM | POA: Diagnosis not present

## 2019-11-12 DIAGNOSIS — G629 Polyneuropathy, unspecified: Secondary | ICD-10-CM | POA: Diagnosis not present

## 2019-11-12 LAB — CMP (CANCER CENTER ONLY)
ALT: 23 U/L (ref 0–44)
AST: 24 U/L (ref 15–41)
Albumin: 3.3 g/dL — ABNORMAL LOW (ref 3.5–5.0)
Alkaline Phosphatase: 118 U/L (ref 38–126)
Anion gap: 11 (ref 5–15)
BUN: 21 mg/dL (ref 8–23)
CO2: 21 mmol/L — ABNORMAL LOW (ref 22–32)
Calcium: 8.9 mg/dL (ref 8.9–10.3)
Chloride: 111 mmol/L (ref 98–111)
Creatinine: 2.07 mg/dL — ABNORMAL HIGH (ref 0.61–1.24)
GFR, Est AFR Am: 36 mL/min — ABNORMAL LOW (ref >60)
GFR, Estimated: 31 mL/min — ABNORMAL LOW (ref >60)
Glucose, Bld: 161 mg/dL — ABNORMAL HIGH (ref 70–99)
Potassium: 3.6 mmol/L (ref 3.5–5.1)
Sodium: 143 mmol/L (ref 135–145)
Total Bilirubin: 0.5 mg/dL (ref 0.3–1.2)
Total Protein: 6.4 g/dL — ABNORMAL LOW (ref 6.5–8.1)

## 2019-11-12 LAB — CBC WITH DIFFERENTIAL (CANCER CENTER ONLY)
Abs Immature Granulocytes: 0.73 10*3/uL — ABNORMAL HIGH (ref 0.00–0.07)
Basophils Absolute: 0.2 10*3/uL — ABNORMAL HIGH (ref 0.0–0.1)
Basophils Relative: 2 %
Eosinophils Absolute: 0.2 10*3/uL (ref 0.0–0.5)
Eosinophils Relative: 1 %
HCT: 35.2 % — ABNORMAL LOW (ref 39.0–52.0)
Hemoglobin: 11.5 g/dL — ABNORMAL LOW (ref 13.0–17.0)
Immature Granulocytes: 7 %
Lymphocytes Relative: 19 %
Lymphs Abs: 2 10*3/uL (ref 0.7–4.0)
MCH: 31 pg (ref 26.0–34.0)
MCHC: 32.7 g/dL (ref 30.0–36.0)
MCV: 94.9 fL (ref 80.0–100.0)
Monocytes Absolute: 1.3 10*3/uL — ABNORMAL HIGH (ref 0.1–1.0)
Monocytes Relative: 12 %
Neutro Abs: 6.4 10*3/uL (ref 1.7–7.7)
Neutrophils Relative %: 59 %
Platelet Count: 76 10*3/uL — ABNORMAL LOW (ref 150–400)
RBC: 3.71 MIL/uL — ABNORMAL LOW (ref 4.22–5.81)
RDW: 16.8 % — ABNORMAL HIGH (ref 11.5–15.5)
WBC Count: 10.6 10*3/uL — ABNORMAL HIGH (ref 4.0–10.5)
nRBC: 0.2 % (ref 0.0–0.2)

## 2019-11-12 LAB — CEA (IN HOUSE-CHCC): CEA (CHCC-In House): 2.5 ng/mL (ref 0.00–5.00)

## 2019-11-12 MED ORDER — LEUCOVORIN CALCIUM INJECTION 350 MG
400.0000 mg/m2 | Freq: Once | INTRAVENOUS | Status: AC
  Administered 2019-11-12: 772 mg via INTRAVENOUS
  Filled 2019-11-12: qty 38.6

## 2019-11-12 MED ORDER — DEXTROSE 5 % IV SOLN
Freq: Once | INTRAVENOUS | Status: AC
  Filled 2019-11-12: qty 250

## 2019-11-12 MED ORDER — SODIUM CHLORIDE 0.9 % IV SOLN
10.0000 mg | Freq: Once | INTRAVENOUS | Status: AC
  Administered 2019-11-12: 10 mg via INTRAVENOUS
  Filled 2019-11-12: qty 10

## 2019-11-12 MED ORDER — FLUOROURACIL CHEMO INJECTION 2.5 GM/50ML
400.0000 mg/m2 | Freq: Once | INTRAVENOUS | Status: AC
  Administered 2019-11-12: 750 mg via INTRAVENOUS
  Filled 2019-11-12: qty 15

## 2019-11-12 MED ORDER — OXALIPLATIN CHEMO INJECTION 100 MG/20ML
64.0000 mg/m2 | Freq: Once | INTRAVENOUS | Status: AC
  Administered 2019-11-12: 125 mg via INTRAVENOUS
  Filled 2019-11-12: qty 20

## 2019-11-12 MED ORDER — PALONOSETRON HCL INJECTION 0.25 MG/5ML
0.2500 mg | Freq: Once | INTRAVENOUS | Status: AC
  Administered 2019-11-12: 0.25 mg via INTRAVENOUS

## 2019-11-12 MED ORDER — SODIUM CHLORIDE 0.9% FLUSH
10.0000 mL | INTRAVENOUS | Status: DC | PRN
  Administered 2019-11-12: 10 mL via INTRAVENOUS
  Filled 2019-11-12: qty 10

## 2019-11-12 MED ORDER — SODIUM CHLORIDE 0.9 % IV SOLN
2400.0000 mg/m2 | INTRAVENOUS | Status: DC
  Administered 2019-11-12: 4650 mg via INTRAVENOUS
  Filled 2019-11-12: qty 93

## 2019-11-12 MED ORDER — PALONOSETRON HCL INJECTION 0.25 MG/5ML
INTRAVENOUS | Status: AC
  Filled 2019-11-12: qty 5

## 2019-11-12 NOTE — Progress Notes (Signed)
Nutrition Assessment   Reason for Assessment: Patient identified on Malnutrition Screening report for weight loss and poor appetite.     ASSESSMENT:  74 year old male with rectal cancer currently on neoadjuvant chemotherapy.  Past medical history of CAD, HTN, GERD, HLD, CKD.    Met with patient during infusion.  Patient reports poor appetite and cold sensitivity.  Reports that he usually eats a good breakfast (egg, bacon, hashbrowns or pancakes/waffles).  Usually does not eat much for lunch.  Evening meal maybe chicken, beans and potatoes.  Does not like ensure.     Medications: vit b12, compazine, omega 3, pepcid, calcium carbonate   Labs: glucose 122, BUN 21, creatinine 2.07   Anthropometrics:   Height: 68 inches Weight: 162 lb 171 lb 08/19/19 BMI: 24  5% weight loss in the last 3 months, concerning   Estimated Energy Needs  Kcals: 2200-2500 Protein: 110-125 g Fluid: > 2.2 L   NUTRITION DIAGNOSIS: Inadequate oral intake related to cancer related treatment side effects as evidenced by 5% weight loss in the last 3 months and poor appetite   INTERVENTION:  Discussed strategies to increase calories and protein.  Handout given Discussed strategies to help with taste change.  Handout given. Discussed trying carnation breakfast essentials powder mixed with milk and warmed for added calories and protein.  Contact information provided   MONITORING, EVALUATION, GOAL: weight trends, intake   Next Visit: July 21 in infusion (Wednesday)  Katie Moch B. Zenia Resides, [place], [place] Registered Dietitian [phone] (pager)

## 2019-11-12 NOTE — Progress Notes (Signed)
OK to treat per MD [facility] with labs today (PLT, CREAT)

## 2019-11-12 NOTE — Progress Notes (Signed)
Hematology and Oncology Follow Up Visit  Jared White [id number] [birth year] 74 y.o. 11/12/2019 9:37 AM Jared White Jared White, MDGreene, Jared White, Jared White   Principle Diagnosis: 74 year old man with T3N2 rectal cancer diagnosed in March 2021.     Prior Therapy: He is status post colonoscopy and endoscopy completed on August 13, 2019.  Rectal biopsy showed an invasive adenocarcinoma.  Current therapy: Neoadjuvant chemotherapy utilizing FOLFOX started on September 05 2019.  He is here for cycle 5 of therapy.  Interim History: [name] returns today for a follow-up visit.  Since the last visit, he reports no major changes in his health.  He has reported slight increase in the toxicities associated with chemotherapy.  He denies any worsening neuropathy but does report cold sensitivity.  He has reported some mild fatigue and decrease in his appetite and lost few pounds.  He denies any abdominal pain, vomiting or diarrhea.  Denies any hematochezia.  He is able to move his bowels regularly at this time.    Medications: Reviewed without changes. Current Outpatient Medications  Medication Sig Dispense Refill  . aspirin EC 81 MG tablet Take 1 tablet (81 mg total) by mouth daily. 90 tablet 3  . atenolol (TENORMIN) 50 MG tablet TAKE 1 TABLET BY MOUTH EVERY DAY 90 tablet 2  . calcium carbonate (OSCAL) 1500 (600 Ca) MG TABS tablet Take by mouth daily.    . clotrimazole-betamethasone (LOTRISONE) cream Apply 1 application topically 2 (two) times daily. (Patient not taking: Reported on 08/13/2019) 30 g 0  . famotidine (PEPCID) 20 MG tablet Take 1 tablet (20 mg total) by mouth 2 (two) times daily. 60 tablet 5  . fluticasone (FLONASE) 50 MCG/ACT nasal spray INSTILL 2 SPRAYS INTO EACH NOSTRIL EVERY DAY (Patient not taking: Reported on 08/13/2019) 48 g 2  . lidocaine-prilocaine (EMLA) cream Apply 1 application topically as needed. 30 g 0  . Magnesium Oxide 400 MG CAPS Take 1 capsule (400 mg total) by mouth 2 (two) times daily.  60 capsule 0  . Omega-3 Fatty Acids (OMEGA 3 PO) Take 520 mg by mouth daily.     Marland Kitchen omeprazole (PRILOSEC) 40 MG capsule Take 1 capsule (40 mg total) by mouth 2 (two) times daily. 90 capsule 3  . prochlorperazine (COMPAZINE) 10 MG tablet Take 1 tablet (10 mg total) by mouth every 6 (six) hours as needed for nausea or vomiting. 30 tablet 0  . rosuvastatin (CRESTOR) 20 MG tablet TAKE 1 TABLET BY MOUTH EVERY DAY 90 tablet 2  . valsartan (DIOVAN) 160 MG tablet TAKE 1 TABLET BY MOUTH ONE TIME DAILY. 90 tablet 2  . vitamin B-12 (CYANOCOBALAMIN) 1000 MCG tablet Take 1,000 mcg by mouth daily.     No current facility-administered medications for this visit.   Facility-Administered Medications Ordered in Other Visits  Medication Dose Route Frequency Provider Last Rate Last Admin  . sodium chloride flush (NS) 0.9 % injection 10 mL  10 mL Intravenous PRN Jared Portela, Jared White   10 mL at 11/12/19 0930     Allergies: No Known Allergies    Physical Exam:   Blood pressure 127/66, pulse 64, temperature 97.8 F (36.6 C), temperature source Temporal, resp. rate 18, height 5\' 8"  (1.727 m), weight 162 lb (73.5 kg), SpO2 99 %.     ECOG: 1    General appearance: Comfortable appearing without any discomfort Head: Normocephalic without any trauma Oropharynx: Mucous membranes are moist and pink without any thrush or ulcers. Eyes: Pupils are equal and  round reactive to light. Lymph nodes: No cervical, supraclavicular, inguinal or axillary lymphadenopathy.   Heart:regular rate and rhythm.  S1 and S2 without leg edema. Lung: Clear without any rhonchi or wheezes.  No dullness to percussion. Abdomin: Soft, nontender, nondistended with good bowel sounds.  No hepatosplenomegaly. Musculoskeletal: No joint deformity or effusion.  Full range of motion noted. Neurological: No deficits noted on motor, sensory and deep tendon reflex exam. Skin: No petechial rash or dryness.  Appeared moist.         Lab  Results: Lab Results  Component Value Date   WBC 16.0 (H) 10/30/2019   HGB 12.9 (L) 10/30/2019   HCT 39.3 10/30/2019   MCV 93.3 10/30/2019   PLT 74 (L) 10/30/2019     Chemistry      Component Value Date/Time   NA 143 10/30/2019 0823   NA 140 12/09/2016 0932   K 4.6 10/30/2019 0823   CL 107 10/30/2019 0823   CO2 25 10/30/2019 0823   BUN 17 10/30/2019 0823   BUN 29 (H) 12/09/2016 0932   CREATININE 1.67 (H) 10/30/2019 0823   CREATININE 1.61 (H) 01/21/2016 1030      Component Value Date/Time   CALCIUM 9.2 10/30/2019 0823   CALCIUM 7.0 (L) 10/09/2015 0458   ALKPHOS 109 10/30/2019 0823   AST 25 10/30/2019 0823   ALT 23 10/30/2019 0823   BILITOT 0.4 10/30/2019 0823        Impression and Plan:  74 year old man with:  1.  T3N2 rectal cancer diagnosed in March 2021.  He was found to have rectal tumor and lymphadenopathy at that time.   He continues to tolerate chemotherapy without any major complications at this time.  Risks and benefits of continuing this approach to complete 8 cycles prior to proceeding with chemoradiation were reviewed.  Potential complications that include neuropathy, cytopenias as well as GI toxicities were reviewed.  He is agreeable to proceed without any dose reduction or delay.   2.  IV access: He continues to receive chemotherapy via Port-A-Cath at this time.  No complications reported.  3.  Antiemetics: Antiemetics are available to him.  No nausea or vomiting reported.  4.  Neuropathy: Very little noted at this time and does not require any dose adjustment of oxaliplatin.  5.  Neutropenia: He is receiving growth factor support after each cycle of therapy.  He is at risk of developing neutropenia.  6.  Thrombocytopenia: No active bleeding noted at this time.  This is related to oxaliplatin chemotherapy.  We will continue to monitor closely.  His platelet count today is above 75,000 and will continue with the same dose and schedule as long as his  count does not drop below 70,000.  7.  Follow-up: In 2 weeks for the next cycle of therapy.  30  minutes were spent on this encounter.  Time was dedicated to reviewing laboratory data, addressing complication related to therapy and reviewing future plan of care.    [name], Jared White 6/22/20219:37 AM

## 2019-11-12 NOTE — Patient Instructions (Signed)
[facility] Discharge Instructions for Patients Receiving Chemotherapy  Today you received the following chemotherapy agents Oxali, Leucovorin and Adrucil.  To help prevent nausea and vomiting after your treatment, we encourage you to take your nausea medication as directed  BUT NO ZOFRAN FOR 3 DAYS POST CHEMO.   If you develop nausea and vomiting that is not controlled by your nausea medication, call the clinic.   BELOW ARE SYMPTOMS THAT SHOULD BE REPORTED IMMEDIATELY:  *FEVER GREATER THAN 100.5 F  *CHILLS WITH OR WITHOUT FEVER  NAUSEA AND VOMITING THAT IS NOT CONTROLLED WITH YOUR NAUSEA MEDICATION  *UNUSUAL SHORTNESS OF BREATH  *UNUSUAL BRUISING OR BLEEDING  TENDERNESS IN MOUTH AND THROAT WITH OR WITHOUT PRESENCE OF ULCERS  *URINARY PROBLEMS  *BOWEL PROBLEMS  UNUSUAL RASH Items with * indicate a potential emergency and should be followed up as soon as possible.  Feel free to call the clinic you have any questions or concerns. The clinic phone number is [phone].  Please show the [facility] at check-in to the Emergency Department and triage nurse.

## 2019-11-14 ENCOUNTER — Inpatient Hospital Stay: Payer: Medicare Other

## 2019-11-14 ENCOUNTER — Other Ambulatory Visit: Payer: Self-pay

## 2019-11-14 VITALS — BP 128/70 | HR 66 | Temp 98.1°F | Resp 18

## 2019-11-14 DIAGNOSIS — G629 Polyneuropathy, unspecified: Secondary | ICD-10-CM | POA: Diagnosis not present

## 2019-11-14 DIAGNOSIS — Z5189 Encounter for other specified aftercare: Secondary | ICD-10-CM | POA: Diagnosis not present

## 2019-11-14 DIAGNOSIS — C20 Malignant neoplasm of rectum: Secondary | ICD-10-CM | POA: Diagnosis not present

## 2019-11-14 DIAGNOSIS — D6959 Other secondary thrombocytopenia: Secondary | ICD-10-CM | POA: Diagnosis not present

## 2019-11-14 DIAGNOSIS — R5383 Other fatigue: Secondary | ICD-10-CM | POA: Diagnosis not present

## 2019-11-14 DIAGNOSIS — D709 Neutropenia, unspecified: Secondary | ICD-10-CM | POA: Diagnosis not present

## 2019-11-14 DIAGNOSIS — R55 Syncope and collapse: Secondary | ICD-10-CM | POA: Diagnosis not present

## 2019-11-14 DIAGNOSIS — Z5111 Encounter for antineoplastic chemotherapy: Secondary | ICD-10-CM | POA: Diagnosis not present

## 2019-11-14 DIAGNOSIS — R590 Localized enlarged lymph nodes: Secondary | ICD-10-CM | POA: Diagnosis not present

## 2019-11-14 DIAGNOSIS — Z79899 Other long term (current) drug therapy: Secondary | ICD-10-CM | POA: Diagnosis not present

## 2019-11-14 MED ORDER — HEPARIN SOD (PORK) LOCK FLUSH 100 UNIT/ML IV SOLN
500.0000 [IU] | Freq: Once | INTRAVENOUS | Status: AC | PRN
  Administered 2019-11-14: 500 [IU]
  Filled 2019-11-14: qty 5

## 2019-11-14 MED ORDER — PEGFILGRASTIM-CBQV 6 MG/0.6ML ~~LOC~~ SOSY
PREFILLED_SYRINGE | SUBCUTANEOUS | Status: AC
  Filled 2019-11-14: qty 0.6

## 2019-11-14 MED ORDER — SODIUM CHLORIDE 0.9% FLUSH
10.0000 mL | INTRAVENOUS | Status: DC | PRN
  Administered 2019-11-14: 10 mL
  Filled 2019-11-14: qty 10

## 2019-11-14 MED ORDER — PEGFILGRASTIM-CBQV 6 MG/0.6ML ~~LOC~~ SOSY
6.0000 mg | PREFILLED_SYRINGE | Freq: Once | SUBCUTANEOUS | Status: AC
  Administered 2019-11-14: 6 mg via SUBCUTANEOUS

## 2019-11-15 ENCOUNTER — Telehealth: Payer: Self-pay | Admitting: Oncology

## 2019-11-15 NOTE — Telephone Encounter (Signed)
Scheduled per los, patient has been called and notified. 

## 2019-11-27 ENCOUNTER — Inpatient Hospital Stay: Payer: Medicare Other

## 2019-11-27 ENCOUNTER — Other Ambulatory Visit: Payer: Self-pay

## 2019-11-27 ENCOUNTER — Inpatient Hospital Stay: Payer: Medicare Other | Attending: Oncology

## 2019-11-27 ENCOUNTER — Inpatient Hospital Stay (HOSPITAL_BASED_OUTPATIENT_CLINIC_OR_DEPARTMENT_OTHER): Payer: Medicare Other | Admitting: Physician Assistant

## 2019-11-27 ENCOUNTER — Encounter: Payer: Medicare Other | Admitting: Nutrition

## 2019-11-27 VITALS — BP 124/65 | HR 60 | Temp 97.6°F | Resp 17 | Ht 68.0 in | Wt 157.4 lb

## 2019-11-27 DIAGNOSIS — Z5189 Encounter for other specified aftercare: Secondary | ICD-10-CM | POA: Insufficient documentation

## 2019-11-27 DIAGNOSIS — Z95828 Presence of other vascular implants and grafts: Secondary | ICD-10-CM | POA: Insufficient documentation

## 2019-11-27 DIAGNOSIS — R590 Localized enlarged lymph nodes: Secondary | ICD-10-CM | POA: Diagnosis not present

## 2019-11-27 DIAGNOSIS — R110 Nausea: Secondary | ICD-10-CM | POA: Diagnosis not present

## 2019-11-27 DIAGNOSIS — E860 Dehydration: Secondary | ICD-10-CM

## 2019-11-27 DIAGNOSIS — D709 Neutropenia, unspecified: Secondary | ICD-10-CM | POA: Diagnosis not present

## 2019-11-27 DIAGNOSIS — R630 Anorexia: Secondary | ICD-10-CM | POA: Insufficient documentation

## 2019-11-27 DIAGNOSIS — Z79899 Other long term (current) drug therapy: Secondary | ICD-10-CM | POA: Insufficient documentation

## 2019-11-27 DIAGNOSIS — Z5111 Encounter for antineoplastic chemotherapy: Secondary | ICD-10-CM | POA: Diagnosis not present

## 2019-11-27 DIAGNOSIS — T451X5A Adverse effect of antineoplastic and immunosuppressive drugs, initial encounter: Secondary | ICD-10-CM | POA: Diagnosis not present

## 2019-11-27 DIAGNOSIS — R634 Abnormal weight loss: Secondary | ICD-10-CM

## 2019-11-27 DIAGNOSIS — D696 Thrombocytopenia, unspecified: Secondary | ICD-10-CM | POA: Diagnosis not present

## 2019-11-27 DIAGNOSIS — C20 Malignant neoplasm of rectum: Secondary | ICD-10-CM

## 2019-11-27 DIAGNOSIS — K219 Gastro-esophageal reflux disease without esophagitis: Secondary | ICD-10-CM | POA: Insufficient documentation

## 2019-11-27 DIAGNOSIS — G620 Drug-induced polyneuropathy: Secondary | ICD-10-CM | POA: Insufficient documentation

## 2019-11-27 DIAGNOSIS — R5383 Other fatigue: Secondary | ICD-10-CM | POA: Insufficient documentation

## 2019-11-27 DIAGNOSIS — Z8249 Family history of ischemic heart disease and other diseases of the circulatory system: Secondary | ICD-10-CM | POA: Diagnosis not present

## 2019-11-27 LAB — CMP (CANCER CENTER ONLY)
ALT: 22 U/L (ref 0–44)
AST: 23 U/L (ref 15–41)
Albumin: 3.4 g/dL — ABNORMAL LOW (ref 3.5–5.0)
Alkaline Phosphatase: 127 U/L — ABNORMAL HIGH (ref 38–126)
Anion gap: 12 (ref 5–15)
BUN: 15 mg/dL (ref 8–23)
CO2: 20 mmol/L — ABNORMAL LOW (ref 22–32)
Calcium: 9.2 mg/dL (ref 8.9–10.3)
Chloride: 109 mmol/L (ref 98–111)
Creatinine: 1.74 mg/dL — ABNORMAL HIGH (ref 0.61–1.24)
GFR, Est AFR Am: 44 mL/min — ABNORMAL LOW (ref >60)
GFR, Estimated: 38 mL/min — ABNORMAL LOW (ref >60)
Glucose, Bld: 172 mg/dL — ABNORMAL HIGH (ref 70–99)
Potassium: 3.8 mmol/L (ref 3.5–5.1)
Sodium: 141 mmol/L (ref 135–145)
Total Bilirubin: 0.6 mg/dL (ref 0.3–1.2)
Total Protein: 6.7 g/dL (ref 6.5–8.1)

## 2019-11-27 LAB — CBC WITH DIFFERENTIAL (CANCER CENTER ONLY)
Abs Immature Granulocytes: 0.68 10*3/uL — ABNORMAL HIGH (ref 0.00–0.07)
Basophils Absolute: 0.1 10*3/uL (ref 0.0–0.1)
Basophils Relative: 1 %
Eosinophils Absolute: 0.1 10*3/uL (ref 0.0–0.5)
Eosinophils Relative: 1 %
HCT: 36.2 % — ABNORMAL LOW (ref 39.0–52.0)
Hemoglobin: 12 g/dL — ABNORMAL LOW (ref 13.0–17.0)
Immature Granulocytes: 5 %
Lymphocytes Relative: 15 %
Lymphs Abs: 1.9 10*3/uL (ref 0.7–4.0)
MCH: 32.3 pg (ref 26.0–34.0)
MCHC: 33.1 g/dL (ref 30.0–36.0)
MCV: 97.3 fL (ref 80.0–100.0)
Monocytes Absolute: 1.1 10*3/uL — ABNORMAL HIGH (ref 0.1–1.0)
Monocytes Relative: 8 %
Neutro Abs: 9.3 10*3/uL — ABNORMAL HIGH (ref 1.7–7.7)
Neutrophils Relative %: 70 %
Platelet Count: 84 10*3/uL — ABNORMAL LOW (ref 150–400)
RBC: 3.72 MIL/uL — ABNORMAL LOW (ref 4.22–5.81)
RDW: 17.6 % — ABNORMAL HIGH (ref 11.5–15.5)
WBC Count: 13.3 10*3/uL — ABNORMAL HIGH (ref 4.0–10.5)
nRBC: 0.2 % (ref 0.0–0.2)

## 2019-11-27 LAB — CEA (IN HOUSE-CHCC): CEA (CHCC-In House): 2.75 ng/mL (ref 0.00–5.00)

## 2019-11-27 MED ORDER — SODIUM CHLORIDE 0.9% FLUSH
10.0000 mL | Freq: Once | INTRAVENOUS | Status: DC
  Filled 2019-11-27: qty 10

## 2019-11-27 MED ORDER — SODIUM CHLORIDE 0.9 % IV SOLN
Freq: Once | INTRAVENOUS | Status: AC
  Filled 2019-11-27: qty 250

## 2019-11-27 NOTE — Progress Notes (Signed)
[facility] OFFICE PROGRESS NOTE  [name], MD [place] [id number]  DIAGNOSIS: 74 year old man with T3N2 rectal cancer diagnosed in March 2021.    PRIOR THERAPY:  He is status post colonoscopy and endoscopy completed on August 13, 2019.  Rectal biopsy showed an invasive adenocarcinoma.  CURRENT THERAPY: Neoadjuvant chemotherapy utilizing FOLFOX started on September 05 2019.  He is here for cycle 5 of therapy.  INTERVAL HISTORY: Jared White 74 y.o. male returns to the clinic today for a follow-up visit.  He is scheduled to receive cycle #6 of his neoadjuvant treatment with FOLFOX today.  The patient declined having his Port-A-Cath accessed in the clinic today due to not wishing to proceed with chemotherapy today. He states that he is not up for treatment to due feeling more nauseous over the last month with poor fluid/food intake. He also endorses fatigue. He states that food tastes like " rotten meat" and he is having a challenging time with cold sensitivity with fluids.  He lost approximately 4-5 pounds since his last appointment. He is followed by nutrition and they met with him on 11/12/2019.  The patient denies any evidence of thrush or mouth sores.   He has tried drinking supplemental drinks such as boost and Ensure; however, he has a challenging time consuming them due to the taste/consistency which reportedly makes him gag.  He is requesting taking "a week or 2 off" of treatment for more time for recovery.  He understands the long dose delays may affect the outcome of his condition.  Otherwise, he denies any recent fever, chills, night sweats.  He denies any abdominal pain, vomiting, constipation, melena/hematochezia.  He had diarrhea yesterday 2 times per day which has resolved.  His last bowel movement was this morning.  He denies any abdominal pain.  He is scheduled to have a repeat colonoscopy in 2 to 3 weeks.  He reports mild neuropathy that is not  constant.  He is still able to perform daily activities such as buttoning his shirt. He is here for evaluation, repeat blood work, and scheduled for cycle #6.   MEDICAL HISTORY: Past Medical History:  Diagnosis Date  . Broken back   . CAD (coronary artery disease)    PCI & CABG  . CHF (congestive heart failure) ([place] [facility])   . Diverticulitis    mild - approx 2004  . Family history of heart disease   . GERD (gastroesophageal reflux disease)   . History of nuclear stress test 06/30/2011   lexiscan; normal pattern of perfusion post-stress; low risk scan   . Hyperlipidemia   . Hypertension   . IgA nephropathy   . Irregular heart beat   . Rectal cancer ([place]) 08/19/2019  . Systemic hypertension     ALLERGIES:  has No Known Allergies.  MEDICATIONS:  Current Outpatient Medications  Medication Sig Dispense Refill  . aspirin EC 81 MG tablet Take 1 tablet (81 mg total) by mouth daily. 90 tablet 3  . atenolol (TENORMIN) 50 MG tablet TAKE 1 TABLET BY MOUTH EVERY DAY 90 tablet 2  . calcium carbonate (OSCAL) 1500 (600 Ca) MG TABS tablet Take by mouth daily.    . famotidine (PEPCID) 20 MG tablet Take 1 tablet (20 mg total) by mouth 2 (two) times daily. 60 tablet 5  . lidocaine-prilocaine (EMLA) cream Apply 1 application topically as needed. 30 g 0  . Magnesium Oxide 400 MG CAPS Take 1 capsule (400 mg total) by  mouth 2 (two) times daily. 60 capsule 0  . Omega-3 Fatty Acids (OMEGA 3 PO) Take 520 mg by mouth daily.     Marland Kitchen omeprazole (PRILOSEC) 40 MG capsule Take 1 capsule (40 mg total) by mouth 2 (two) times daily. 90 capsule 3  . prochlorperazine (COMPAZINE) 10 MG tablet Take 1 tablet (10 mg total) by mouth every 6 (six) hours as needed for nausea or vomiting. 30 tablet 0  . rosuvastatin (CRESTOR) 20 MG tablet TAKE 1 TABLET BY MOUTH EVERY DAY 90 tablet 2  . valsartan (DIOVAN) 160 MG tablet TAKE 1 TABLET BY MOUTH ONE TIME DAILY. 90 tablet 2  . vitamin B-12 (CYANOCOBALAMIN) 1000 MCG tablet Take 1,000 mcg by  mouth daily.    . clotrimazole-betamethasone (LOTRISONE) cream Apply 1 application topically 2 (two) times daily. (Patient not taking: Reported on 08/13/2019) 30 g 0  . fluticasone (FLONASE) 50 MCG/ACT nasal spray INSTILL 2 SPRAYS INTO EACH NOSTRIL EVERY DAY (Patient not taking: Reported on 08/13/2019) 48 g 2  . nitroGLYCERIN (NITROSTAT) 0.4 MG SL tablet SMARTSIG:2 Tablet(s) Sublingual PRN (Patient not taking: Reported on 11/27/2019)     No current facility-administered medications for this visit.    SURGICAL HISTORY:  Past Surgical History:  Procedure Laterality Date  . CARDIAC CATHETERIZATION  08/1995   PTCA of OM (Dr. Marella Chimes)  . CORONARY ARTERY BYPASS GRAFT  01/2000   x5 - LIMA to LAD, SVG to diagonal, sequential SVG to ramus & OM, SVG to PDA (Dr. Tharon Aquas Trigt)  . IR IMAGING GUIDED PORT INSERTION  09/04/2019  . LUNG LOBECTOMY     left upper lobe  . TRANSTHORACIC ECHOCARDIOGRAM  12/24/2012   EF 55-60%, mild LVH, mild conc hypertrophy; mild MR; LA mildly dilated    REVIEW OF SYSTEMS:   Review of Systems  Constitutional: Positive for fatigue, appetite change, and weight loss.  Negative for chills and fever. HENT: Positive for taste changes. negative for mouth sores, nosebleeds, sore throat and trouble swallowing.   Eyes: Negative for eye problems and icterus.  Respiratory: Negative for cough, hemoptysis, shortness of breath and wheezing.   Cardiovascular: Negative for chest pain and leg swelling.  Gastrointestinal: Negative for abdominal pain, constipation, diarrhea (resolved), nausea and vomiting.  Genitourinary: Negative for bladder incontinence, difficulty urinating, dysuria, frequency and hematuria.   Musculoskeletal: Negative for back pain, gait problem, neck pain and neck stiffness.  Skin: Negative for itching and rash.  Neurological: Negative for dizziness, extremity weakness, gait problem, headaches, light-headedness and seizures.  Hematological: Negative for adenopathy.  Does not bruise/bleed easily.  Psychiatric/Behavioral: Negative for confusion, depression and sleep disturbance. The patient is not nervous/anxious.     PHYSICAL EXAMINATION:  Blood pressure 124/65, pulse 60, temperature 97.6 F (36.4 C), temperature source Temporal, resp. rate 17, height _0  (1.727 m), weight 157 lb 6.4 oz (71.4 kg), SpO2 97 %.  ECOG PERFORMANCE STATUS: 1 - Symptomatic but completely ambulatory  Physical Exam  Constitutional: Oriented to person, place, and time and well-developed, well-nourished, and in no distress.  HENT:  Head: Normocephalic and atraumatic.  Mouth/Throat: Oropharynx is clear and moist. No oropharyngeal exudate.  Eyes: Conjunctivae are normal. Right eye exhibits no discharge. Left eye exhibits no discharge. No scleral icterus.  Neck: Normal range of motion. Neck supple.  Cardiovascular: Normal rate, regular rhythm, normal heart sounds and intact distal pulses.   Pulmonary/Chest: Effort normal and breath sounds normal. No respiratory distress. No wheezes. No rales.  Abdominal: Soft. Mild tenderness in the  LLQ. Bowel sounds are normal. Exhibits no distension and no mass.  Musculoskeletal: Normal range of motion. Exhibits no edema.  Lymphadenopathy:    No cervical adenopathy.  Neurological: Alert and oriented to person, place, and time. Exhibits normal muscle tone. Gait normal. Coordination normal.  Skin: Skin is warm and dry. No rash noted. Not diaphoretic. No erythema. No pallor.  Psychiatric: Mood, memory and judgment normal.  Vitals reviewed.  LABORATORY DATA: Lab Results  Component Value Date   WBC 13.3 (H) 11/27/2019   HGB 12.0 (L) 11/27/2019   HCT 36.2 (L) 11/27/2019   MCV 97.3 11/27/2019   PLT 84 (L) 11/27/2019      Chemistry      Component Value Date/Time   NA 141 11/27/2019 1013   NA 140 12/09/2016 0932   K 3.8 11/27/2019 1013   CL 109 11/27/2019 1013   CO2 20 (L) 11/27/2019 1013   BUN 15 11/27/2019 1013   BUN 29 (H)  12/09/2016 0932   CREATININE 1.74 (H) 11/27/2019 1013   CREATININE 1.61 (H) 01/21/2016 1030      Component Value Date/Time   CALCIUM 9.2 11/27/2019 1013   CALCIUM 7.0 (L) 10/09/2015 0458   ALKPHOS 127 (H) 11/27/2019 1013   AST 23 11/27/2019 1013   ALT 22 11/27/2019 1013   BILITOT 0.6 11/27/2019 1013       RADIOGRAPHIC STUDIES:  No results found.   ASSESSMENT/PLAN:  74 year old man with:  1.  T3N2 rectal cancer diagnosed in March 2021.  He was found to have rectal tumor and lymphadenopathy at that time.  His labs are adequate for treatment with cycle #6 today; however, the patient is requesting to not have treatment today due to fatigue and poor appetite/oral intake. He is requesting more time off to recover. I had a lengthy discussion with the patient today. Discussed that lengthy dose delays may affect his outcomes and that I would not recommend long dose delays. He was agreeable to coming back in 1 week to see Dr. Alen Blew and for consideration to resume treatment with cycle #6 at that time. Due to his poor oral intake, he was interested/agreeable to receive 1L of normal saline today instead.   2.  IV access: He continues to receive chemotherapy via Port-A-Cath at this time.  No complications reported.  3.  Antiemetics: Antiemetics are available to him.    Patient reporting nausea without vomiting. Offered IV anti-emetics today while in infusion but the patient declined due to no nausea today.   4.  Neuropathy: Very little noted at this time and does not require any dose adjustment of oxaliplatin. Patient still able to perform normal fine motor functions without difficulty.   5.  Neutropenia: He is receiving growth factor support after each cycle of therapy.  He is at risk of developing neutropenia.  6.  Thrombocytopenia: No active bleeding noted at this time.  This is related to oxaliplatin chemotherapy.  We will continue to monitor closely.  His platelet count today is  above 75,000 and will continue with the same dose and schedule as long as his count does not drop below 70,000.  7. Poor appetite/oral intake: Patient followed by a member of the nutritionist team. The patient does not like the consistency of supplemental nutrition drinks such as boost/ensure. Patient interested in receiving 1L of fluid today in the clinic. No evidence of thrush on exam. Patient encouraged to use salt water rinses/biotene.   8.  Follow-up: In 1 week for consideration of  starting cycle #6.    No orders of the defined types were placed in this encounter.    Mar Walmer L Antionetta Ator, PA-C 11/27/19

## 2019-11-28 ENCOUNTER — Other Ambulatory Visit: Payer: Self-pay | Admitting: Cardiology

## 2019-11-28 DIAGNOSIS — I2510 Atherosclerotic heart disease of native coronary artery without angina pectoris: Secondary | ICD-10-CM

## 2019-12-03 ENCOUNTER — Telehealth: Payer: Self-pay | Admitting: Oncology

## 2019-12-03 NOTE — Telephone Encounter (Signed)
Scheduled per 07/07 los, patient has been called and notified. 

## 2019-12-04 ENCOUNTER — Inpatient Hospital Stay: Payer: Medicare Other

## 2019-12-04 ENCOUNTER — Telehealth: Payer: Self-pay

## 2019-12-04 ENCOUNTER — Other Ambulatory Visit: Payer: Self-pay

## 2019-12-04 ENCOUNTER — Inpatient Hospital Stay (HOSPITAL_BASED_OUTPATIENT_CLINIC_OR_DEPARTMENT_OTHER): Payer: Medicare Other | Admitting: Oncology

## 2019-12-04 VITALS — BP 132/68 | HR 61 | Temp 97.5°F | Resp 17 | Ht 68.0 in | Wt 158.5 lb

## 2019-12-04 DIAGNOSIS — T451X5A Adverse effect of antineoplastic and immunosuppressive drugs, initial encounter: Secondary | ICD-10-CM | POA: Diagnosis not present

## 2019-12-04 DIAGNOSIS — R5383 Other fatigue: Secondary | ICD-10-CM | POA: Diagnosis not present

## 2019-12-04 DIAGNOSIS — K219 Gastro-esophageal reflux disease without esophagitis: Secondary | ICD-10-CM | POA: Diagnosis not present

## 2019-12-04 DIAGNOSIS — D696 Thrombocytopenia, unspecified: Secondary | ICD-10-CM | POA: Diagnosis not present

## 2019-12-04 DIAGNOSIS — Z5189 Encounter for other specified aftercare: Secondary | ICD-10-CM | POA: Diagnosis not present

## 2019-12-04 DIAGNOSIS — Z95828 Presence of other vascular implants and grafts: Secondary | ICD-10-CM

## 2019-12-04 DIAGNOSIS — R110 Nausea: Secondary | ICD-10-CM | POA: Diagnosis not present

## 2019-12-04 DIAGNOSIS — Z8249 Family history of ischemic heart disease and other diseases of the circulatory system: Secondary | ICD-10-CM | POA: Diagnosis not present

## 2019-12-04 DIAGNOSIS — G620 Drug-induced polyneuropathy: Secondary | ICD-10-CM | POA: Diagnosis not present

## 2019-12-04 DIAGNOSIS — Z5111 Encounter for antineoplastic chemotherapy: Secondary | ICD-10-CM | POA: Diagnosis not present

## 2019-12-04 DIAGNOSIS — R590 Localized enlarged lymph nodes: Secondary | ICD-10-CM | POA: Diagnosis not present

## 2019-12-04 DIAGNOSIS — C20 Malignant neoplasm of rectum: Secondary | ICD-10-CM

## 2019-12-04 DIAGNOSIS — Z79899 Other long term (current) drug therapy: Secondary | ICD-10-CM | POA: Diagnosis not present

## 2019-12-04 DIAGNOSIS — D709 Neutropenia, unspecified: Secondary | ICD-10-CM | POA: Diagnosis not present

## 2019-12-04 LAB — CBC WITH DIFFERENTIAL (CANCER CENTER ONLY)
Abs Immature Granulocytes: 0.03 10*3/uL (ref 0.00–0.07)
Basophils Absolute: 0 10*3/uL (ref 0.0–0.1)
Basophils Relative: 1 %
Eosinophils Absolute: 0.1 10*3/uL (ref 0.0–0.5)
Eosinophils Relative: 2 %
HCT: 32.5 % — ABNORMAL LOW (ref 39.0–52.0)
Hemoglobin: 10.5 g/dL — ABNORMAL LOW (ref 13.0–17.0)
Immature Granulocytes: 1 %
Lymphocytes Relative: 18 %
Lymphs Abs: 0.9 10*3/uL (ref 0.7–4.0)
MCH: 32.4 pg (ref 26.0–34.0)
MCHC: 32.3 g/dL (ref 30.0–36.0)
MCV: 100.3 fL — ABNORMAL HIGH (ref 80.0–100.0)
Monocytes Absolute: 0.7 10*3/uL (ref 0.1–1.0)
Monocytes Relative: 13 %
Neutro Abs: 3.4 10*3/uL (ref 1.7–7.7)
Neutrophils Relative %: 65 %
Platelet Count: 105 10*3/uL — ABNORMAL LOW (ref 150–400)
RBC: 3.24 MIL/uL — ABNORMAL LOW (ref 4.22–5.81)
RDW: 18.6 % — ABNORMAL HIGH (ref 11.5–15.5)
WBC Count: 5.2 10*3/uL (ref 4.0–10.5)
nRBC: 0 % (ref 0.0–0.2)

## 2019-12-04 LAB — CMP (CANCER CENTER ONLY)
ALT: 14 U/L (ref 0–44)
AST: 24 U/L (ref 15–41)
Albumin: 3.1 g/dL — ABNORMAL LOW (ref 3.5–5.0)
Alkaline Phosphatase: 89 U/L (ref 38–126)
Anion gap: 7 (ref 5–15)
BUN: 19 mg/dL (ref 8–23)
CO2: 25 mmol/L (ref 22–32)
Calcium: 9.1 mg/dL (ref 8.9–10.3)
Chloride: 109 mmol/L (ref 98–111)
Creatinine: 1.54 mg/dL — ABNORMAL HIGH (ref 0.61–1.24)
GFR, Est AFR Am: 51 mL/min — ABNORMAL LOW (ref >60)
GFR, Estimated: 44 mL/min — ABNORMAL LOW (ref >60)
Glucose, Bld: 138 mg/dL — ABNORMAL HIGH (ref 70–99)
Potassium: 4.1 mmol/L (ref 3.5–5.1)
Sodium: 141 mmol/L (ref 135–145)
Total Bilirubin: 0.4 mg/dL (ref 0.3–1.2)
Total Protein: 6.3 g/dL — ABNORMAL LOW (ref 6.5–8.1)

## 2019-12-04 LAB — CEA (IN HOUSE-CHCC): CEA (CHCC-In House): 2.12 ng/mL (ref 0.00–5.00)

## 2019-12-04 MED ORDER — HEPARIN SOD (PORK) LOCK FLUSH 100 UNIT/ML IV SOLN
500.0000 [IU] | Freq: Once | INTRAVENOUS | Status: AC
  Administered 2019-12-04: 500 [IU]
  Filled 2019-12-04: qty 5

## 2019-12-04 MED ORDER — SODIUM CHLORIDE 0.9% FLUSH
10.0000 mL | Freq: Once | INTRAVENOUS | Status: AC
  Administered 2019-12-04: 10 mL
  Filled 2019-12-04: qty 10

## 2019-12-04 NOTE — Telephone Encounter (Signed)
02/04/20 at 210 pm appt with Dr Rush Landmark to discuss.

## 2019-12-04 NOTE — Progress Notes (Signed)
Hematology and Oncology Follow Up Visit  Jared White [id number] [birth year] 75 y.o. 12/04/2019 10:10 AM Jared White, MDGreene, [name], MD   Principle Diagnosis: 74 year old man with rectal cancer diagnosed in March 2021.  He was found to have rectal cancer diagnosed in March 2021.  He was found to have T3N2 disease.   Prior Therapy: He is status post colonoscopy and endoscopy completed on August 13, 2019.  Rectal biopsy showed an invasive adenocarcinoma.  Current therapy: Neoadjuvant chemotherapy utilizing FOLFOX started on September 05 2019.  He is here for cycle 6 of therapy.  Interim History: Jared White presents today for return evaluation.  Since the last visit, he is experiencing more complication related to chemotherapy.  He has reported some mild fatigue and nausea and overall poor p.o. intake.  His chemotherapy was withheld last week with some improvement but not complete resolution of his symptoms.  He does report some neuropathy in his toes but not fingers.  He does report cold sensitivity.    Medications: Updated on review. Current Outpatient Medications  Medication Sig Dispense Refill  . aspirin EC 81 MG tablet Take 1 tablet (81 mg total) by mouth daily. 90 tablet 3  . atenolol (TENORMIN) 50 MG tablet TAKE 1 TABLET BY MOUTH EVERY DAY 90 tablet 2  . calcium carbonate (OSCAL) 1500 (600 Ca) MG TABS tablet Take by mouth daily.    . clotrimazole-betamethasone (LOTRISONE) cream Apply 1 application topically 2 (two) times daily. (Patient not taking: Reported on 08/13/2019) 30 g 0  . famotidine (PEPCID) 20 MG tablet Take 1 tablet (20 mg total) by mouth 2 (two) times daily. 60 tablet 5  . fluticasone (FLONASE) 50 MCG/ACT nasal spray INSTILL 2 SPRAYS INTO EACH NOSTRIL EVERY DAY (Patient not taking: Reported on 08/13/2019) 48 g 2  . lidocaine-prilocaine (EMLA) cream Apply 1 application topically as needed. 30 g 0  . Magnesium Oxide 400 MG CAPS Take 1 capsule (400 mg total) by mouth 2 (two)  times daily. 60 capsule 0  . nitroGLYCERIN (NITROSTAT) 0.4 MG SL tablet SMARTSIG:2 Tablet(s) Sublingual PRN (Patient not taking: Reported on 11/27/2019)    . Omega-3 Fatty Acids (OMEGA 3 PO) Take 520 mg by mouth daily.     Marland Kitchen omeprazole (PRILOSEC) 40 MG capsule Take 1 capsule (40 mg total) by mouth 2 (two) times daily. 90 capsule 3  . prochlorperazine (COMPAZINE) 10 MG tablet Take 1 tablet (10 mg total) by mouth every 6 (six) hours as needed for nausea or vomiting. 30 tablet 0  . rosuvastatin (CRESTOR) 20 MG tablet TAKE 1 TABLET BY MOUTH EVERY DAY 90 tablet 2  . valsartan (DIOVAN) 160 MG tablet TAKE 1 TABLET BY MOUTH ONE TIME DAILY. 90 tablet 2  . vitamin B-12 (CYANOCOBALAMIN) 1000 MCG tablet Take 1,000 mcg by mouth daily.     No current facility-administered medications for this visit.     Allergies: No Known Allergies    Physical Exam:   Blood pressure 132/68, pulse 61, temperature (!) 97.5 F (36.4 C), temperature source Temporal, resp. rate 17, height 5\' 8"  (1.727 m), weight 158 lb 8 oz (71.9 kg), SpO2 99 %.     ECOG: 1   General appearance: Alert, awake without any distress. Head: Atraumatic without abnormalities Oropharynx: Without any thrush or ulcers. Eyes: No scleral icterus. Lymph nodes: No lymphadenopathy noted in the cervical, supraclavicular, or axillary nodes Heart:regular rate and rhythm, without any murmurs or gallops.   Lung: Clear to auscultation without any rhonchi,  wheezes or dullness to percussion. Abdomin: Soft, nontender without any shifting dullness or ascites. Musculoskeletal: No clubbing or cyanosis. Neurological: No motor or sensory deficits. Skin: No rashes or lesions.         Lab Results: Lab Results  Component Value Date   WBC 5.2 12/04/2019   HGB 10.5 (L) 12/04/2019   HCT 32.5 (L) 12/04/2019   MCV 100.3 (H) 12/04/2019   PLT 105 (L) 12/04/2019     Chemistry      Component Value Date/Time   NA 141 11/27/2019 1013   NA 140  12/09/2016 0932   K 3.8 11/27/2019 1013   CL 109 11/27/2019 1013   CO2 20 (L) 11/27/2019 1013   BUN 15 11/27/2019 1013   BUN 29 (H) 12/09/2016 0932   CREATININE 1.74 (H) 11/27/2019 1013   CREATININE 1.61 (H) 01/21/2016 1030      Component Value Date/Time   CALCIUM 9.2 11/27/2019 1013   CALCIUM 7.0 (L) 10/09/2015 0458   ALKPHOS 127 (H) 11/27/2019 1013   AST 23 11/27/2019 1013   ALT 22 11/27/2019 1013   BILITOT 0.6 11/27/2019 1013        Impression and Plan:  74 year old man with:  1.  Rectal cancer presented with rectal tumor and adenopathy in March 2021.  He was found to have T3N2 disease.   He has completed 5 cycles of therapy and cycle 6 and is due today.  Risks and benefits of restarting cycle 6 were reviewed.  He opted to defer treatment for the time being.  This is predominantly related to cumulative toxicities including weight loss and anorexia.  2.  IV access: Portacath in place without issues.   3.  Antiemetics: No nausea or vomiting reported.   4.  Neuropathy: related to chemotherapy.  Continues to be a low-grade but manageable at this time.  5.  Neutropenia: will need growth factor support after each cycle.    6.  Thrombocytopenia: Improved at this time off chemotherapy.  7.  Follow-up: In 2 weeks for the next cycle of therapy.  30  minutes were dedicated to this visit.  The time was spent on updating his disease status, addressing complications related therapy and future plan of care review.     Jared Button, MD 7/14/202110:10 AM

## 2019-12-04 NOTE — Telephone Encounter (Signed)
The pt has been advised of the appt 

## 2019-12-04 NOTE — Telephone Encounter (Signed)
Dr Rush Landmark the pt has a recall entered for a colonoscopy but in the notes it mentions EGD/EMR as well.  Does he need both?

## 2019-12-04 NOTE — Telephone Encounter (Signed)
Patty, When we does have the patient come in to clinic so we can discuss the role of EGD with EMR. He is dealing with significant issues from his rectal cancer and will be best for [place] to decide together whether we even need to pursue that upper polyp resection. Follow-up with me when I have next availability. Thanks for [employer]

## 2019-12-05 DIAGNOSIS — C189 Malignant neoplasm of colon, unspecified: Secondary | ICD-10-CM | POA: Diagnosis not present

## 2019-12-11 ENCOUNTER — Other Ambulatory Visit: Payer: Medicare Other

## 2019-12-11 ENCOUNTER — Ambulatory Visit: Payer: Medicare Other

## 2019-12-11 ENCOUNTER — Ambulatory Visit: Payer: Medicare Other | Admitting: Oncology

## 2019-12-18 ENCOUNTER — Inpatient Hospital Stay: Payer: Medicare Other

## 2019-12-18 ENCOUNTER — Other Ambulatory Visit: Payer: Medicare Other

## 2019-12-18 ENCOUNTER — Inpatient Hospital Stay (HOSPITAL_BASED_OUTPATIENT_CLINIC_OR_DEPARTMENT_OTHER): Payer: Medicare Other | Admitting: Oncology

## 2019-12-18 ENCOUNTER — Other Ambulatory Visit: Payer: Self-pay

## 2019-12-18 VITALS — BP 125/63 | HR 57 | Temp 97.3°F | Resp 18 | Ht 68.0 in | Wt 158.1 lb

## 2019-12-18 DIAGNOSIS — Z5189 Encounter for other specified aftercare: Secondary | ICD-10-CM | POA: Diagnosis not present

## 2019-12-18 DIAGNOSIS — D709 Neutropenia, unspecified: Secondary | ICD-10-CM | POA: Diagnosis not present

## 2019-12-18 DIAGNOSIS — C20 Malignant neoplasm of rectum: Secondary | ICD-10-CM

## 2019-12-18 DIAGNOSIS — Z5111 Encounter for antineoplastic chemotherapy: Secondary | ICD-10-CM | POA: Diagnosis not present

## 2019-12-18 DIAGNOSIS — Z8249 Family history of ischemic heart disease and other diseases of the circulatory system: Secondary | ICD-10-CM | POA: Diagnosis not present

## 2019-12-18 DIAGNOSIS — C189 Malignant neoplasm of colon, unspecified: Secondary | ICD-10-CM | POA: Diagnosis not present

## 2019-12-18 DIAGNOSIS — K219 Gastro-esophageal reflux disease without esophagitis: Secondary | ICD-10-CM | POA: Diagnosis not present

## 2019-12-18 DIAGNOSIS — R5383 Other fatigue: Secondary | ICD-10-CM | POA: Diagnosis not present

## 2019-12-18 DIAGNOSIS — R590 Localized enlarged lymph nodes: Secondary | ICD-10-CM | POA: Diagnosis not present

## 2019-12-18 DIAGNOSIS — G620 Drug-induced polyneuropathy: Secondary | ICD-10-CM | POA: Diagnosis not present

## 2019-12-18 DIAGNOSIS — Z79899 Other long term (current) drug therapy: Secondary | ICD-10-CM | POA: Diagnosis not present

## 2019-12-18 DIAGNOSIS — R110 Nausea: Secondary | ICD-10-CM | POA: Diagnosis not present

## 2019-12-18 DIAGNOSIS — D696 Thrombocytopenia, unspecified: Secondary | ICD-10-CM | POA: Diagnosis not present

## 2019-12-18 DIAGNOSIS — T451X5A Adverse effect of antineoplastic and immunosuppressive drugs, initial encounter: Secondary | ICD-10-CM | POA: Diagnosis not present

## 2019-12-18 LAB — CBC WITH DIFFERENTIAL (CANCER CENTER ONLY)
Abs Immature Granulocytes: 0.01 10*3/uL (ref 0.00–0.07)
Basophils Absolute: 0.1 10*3/uL (ref 0.0–0.1)
Basophils Relative: 1 %
Eosinophils Absolute: 0.2 10*3/uL (ref 0.0–0.5)
Eosinophils Relative: 4 %
HCT: 35.2 % — ABNORMAL LOW (ref 39.0–52.0)
Hemoglobin: 11.2 g/dL — ABNORMAL LOW (ref 13.0–17.0)
Immature Granulocytes: 0 %
Lymphocytes Relative: 23 %
Lymphs Abs: 1 10*3/uL (ref 0.7–4.0)
MCH: 32.6 pg (ref 26.0–34.0)
MCHC: 31.8 g/dL (ref 30.0–36.0)
MCV: 102.3 fL — ABNORMAL HIGH (ref 80.0–100.0)
Monocytes Absolute: 0.4 10*3/uL (ref 0.1–1.0)
Monocytes Relative: 10 %
Neutro Abs: 2.7 10*3/uL (ref 1.7–7.7)
Neutrophils Relative %: 62 %
Platelet Count: 103 10*3/uL — ABNORMAL LOW (ref 150–400)
RBC: 3.44 MIL/uL — ABNORMAL LOW (ref 4.22–5.81)
RDW: 16.8 % — ABNORMAL HIGH (ref 11.5–15.5)
WBC Count: 4.4 10*3/uL (ref 4.0–10.5)
nRBC: 0 % (ref 0.0–0.2)

## 2019-12-18 LAB — CMP (CANCER CENTER ONLY)
ALT: 16 U/L (ref 0–44)
AST: 26 U/L (ref 15–41)
Albumin: 3.4 g/dL — ABNORMAL LOW (ref 3.5–5.0)
Alkaline Phosphatase: 79 U/L (ref 38–126)
Anion gap: 8 (ref 5–15)
BUN: 23 mg/dL (ref 8–23)
CO2: 21 mmol/L — ABNORMAL LOW (ref 22–32)
Calcium: 9.1 mg/dL (ref 8.9–10.3)
Chloride: 111 mmol/L (ref 98–111)
Creatinine: 1.55 mg/dL — ABNORMAL HIGH (ref 0.61–1.24)
GFR, Est AFR Am: 51 mL/min — ABNORMAL LOW (ref >60)
GFR, Estimated: 44 mL/min — ABNORMAL LOW (ref >60)
Glucose, Bld: 162 mg/dL — ABNORMAL HIGH (ref 70–99)
Potassium: 4.1 mmol/L (ref 3.5–5.1)
Sodium: 140 mmol/L (ref 135–145)
Total Bilirubin: 0.5 mg/dL (ref 0.3–1.2)
Total Protein: 6.6 g/dL (ref 6.5–8.1)

## 2019-12-18 LAB — CEA (IN HOUSE-CHCC): CEA (CHCC-In House): 2.03 ng/mL (ref 0.00–5.00)

## 2019-12-18 MED ORDER — PALONOSETRON HCL INJECTION 0.25 MG/5ML
INTRAVENOUS | Status: AC
  Filled 2019-12-18: qty 5

## 2019-12-18 MED ORDER — DEXTROSE 5 % IV SOLN
Freq: Once | INTRAVENOUS | Status: AC
  Filled 2019-12-18: qty 250

## 2019-12-18 MED ORDER — SODIUM CHLORIDE 0.9 % IV SOLN
10.0000 mg | Freq: Once | INTRAVENOUS | Status: AC
  Administered 2019-12-18: 10 mg via INTRAVENOUS
  Filled 2019-12-18: qty 10
  Filled 2019-12-18 (×2): qty 1

## 2019-12-18 MED ORDER — FLUOROURACIL CHEMO INJECTION 2.5 GM/50ML
400.0000 mg/m2 | Freq: Once | INTRAVENOUS | Status: AC
  Administered 2019-12-18: 750 mg via INTRAVENOUS
  Filled 2019-12-18: qty 15

## 2019-12-18 MED ORDER — LEUCOVORIN CALCIUM INJECTION 350 MG
400.0000 mg/m2 | Freq: Once | INTRAVENOUS | Status: AC
  Administered 2019-12-18: 772 mg via INTRAVENOUS
  Filled 2019-12-18: qty 38.6

## 2019-12-18 MED ORDER — SODIUM CHLORIDE 0.9 % IV SOLN
2400.0000 mg/m2 | INTRAVENOUS | Status: DC
  Administered 2019-12-18: 4650 mg via INTRAVENOUS
  Filled 2019-12-18: qty 93

## 2019-12-18 MED ORDER — OXALIPLATIN CHEMO INJECTION 100 MG/20ML
64.0000 mg/m2 | Freq: Once | INTRAVENOUS | Status: AC
  Administered 2019-12-18: 125 mg via INTRAVENOUS
  Filled 2019-12-18: qty 20

## 2019-12-18 MED ORDER — PALONOSETRON HCL INJECTION 0.25 MG/5ML
0.2500 mg | Freq: Once | INTRAVENOUS | Status: AC
  Administered 2019-12-18: 0.25 mg via INTRAVENOUS

## 2019-12-18 NOTE — Progress Notes (Signed)
Per Dr. Alen Blew: okay to treat with Scr. of 1.55

## 2019-12-18 NOTE — Progress Notes (Signed)
Hematology and Oncology Follow Up Visit  Jared White [id number] Mar 27, 1946 74 y.o. 12/18/2019 11:25 AM Jared White, MDGreene, [name], MD   Principle Diagnosis: 74 year old man with T3N2 adenocarcinoma of the rectum diagnosed in March 2021.  He had presented with pelvic adenopathy.   Prior Therapy: He is status post colonoscopy and endoscopy completed on August 13, 2019.  Rectal biopsy showed an invasive adenocarcinoma.  Current therapy: Neoadjuvant chemotherapy utilizing FOLFOX started on September 05 2019.  He is status post 5 cycles of therapy.  Interim History: Jared White is here for a follow-up visit.  Since the last visit, he reports overall feeling well without any new complaints.  He feels his body is recovered since the last treatment.  He denies any nausea, vomiting or worsening neuropathy.  He has had a ability to eat has improved with a treatment break.  Although the taste is altered by chemotherapy but has overall improved.  Is able to gain weight    Medications: Reviewed without changes. Current Outpatient Medications  Medication Sig Dispense Refill  . aspirin EC 81 MG tablet Take 1 tablet (81 mg total) by mouth daily. 90 tablet 3  . atenolol (TENORMIN) 50 MG tablet TAKE 1 TABLET BY MOUTH EVERY DAY 90 tablet 2  . calcium carbonate (OSCAL) 1500 (600 Ca) MG TABS tablet Take by mouth daily.    . clotrimazole-betamethasone (LOTRISONE) cream Apply 1 application topically 2 (two) times daily. (Patient not taking: Reported on 08/13/2019) 30 g 0  . famotidine (PEPCID) 20 MG tablet Take 1 tablet (20 mg total) by mouth 2 (two) times daily. 60 tablet 5  . fluticasone (FLONASE) 50 MCG/ACT nasal spray INSTILL 2 SPRAYS INTO EACH NOSTRIL EVERY DAY (Patient not taking: Reported on 08/13/2019) 48 g 2  . lidocaine-prilocaine (EMLA) cream Apply 1 application topically as needed. 30 g 0  . Magnesium Oxide 400 MG CAPS Take 1 capsule (400 mg total) by mouth 2 (two) times daily. 60 capsule 0  .  nitroGLYCERIN (NITROSTAT) 0.4 MG SL tablet SMARTSIG:2 Tablet(s) Sublingual PRN (Patient not taking: Reported on 11/27/2019)    . Omega-3 Fatty Acids (OMEGA 3 PO) Take 520 mg by mouth daily.     Marland Kitchen omeprazole (PRILOSEC) 40 MG capsule Take 1 capsule (40 mg total) by mouth 2 (two) times daily. 90 capsule 3  . prochlorperazine (COMPAZINE) 10 MG tablet Take 1 tablet (10 mg total) by mouth every 6 (six) hours as needed for nausea or vomiting. 30 tablet 0  . rosuvastatin (CRESTOR) 20 MG tablet TAKE 1 TABLET BY MOUTH EVERY DAY 90 tablet 2  . valsartan (DIOVAN) 160 MG tablet TAKE 1 TABLET BY MOUTH ONE TIME DAILY. 90 tablet 2  . vitamin B-12 (CYANOCOBALAMIN) 1000 MCG tablet Take 1,000 mcg by mouth daily.     No current facility-administered medications for this visit.     Allergies: No Known Allergies    Physical Exam:    Blood pressure (!) 125/63, pulse 57, temperature (!) 97.3 F (36.3 C), temperature source Temporal, resp. rate 18, height 5\' 8"  (1.727 m), weight 158 lb 1.6 oz (71.7 kg), SpO2 99 %.     ECOG: 1    General appearance: Comfortable appearing without any discomfort Head: Normocephalic without any trauma Oropharynx: Mucous membranes are moist and pink without any thrush or ulcers. Eyes: Pupils are equal and round reactive to light. Lymph nodes: No cervical, supraclavicular, inguinal or axillary lymphadenopathy.   Heart:regular rate and rhythm.  S1 and S2 without  leg edema. Lung: Clear without any rhonchi or wheezes.  No dullness to percussion. Abdomin: Soft, nontender, nondistended with good bowel sounds.  No hepatosplenomegaly. Musculoskeletal: No joint deformity or effusion.  Full range of motion noted. Neurological: No deficits noted on motor, sensory and deep tendon reflex exam. Skin: No petechial rash or dryness.  Appeared moist.           Lab Results: Lab Results  Component Value Date   WBC 5.2 12/04/2019   HGB 10.5 (L) 12/04/2019   HCT 32.5 (L) 12/04/2019    MCV 100.3 (H) 12/04/2019   PLT 105 (L) 12/04/2019     Chemistry      Component Value Date/Time   NA 141 12/04/2019 0947   NA 140 12/09/2016 0932   K 4.1 12/04/2019 0947   CL 109 12/04/2019 0947   CO2 25 12/04/2019 0947   BUN 19 12/04/2019 0947   BUN 29 (H) 12/09/2016 0932   CREATININE 1.54 (H) 12/04/2019 0947   CREATININE 1.61 (H) 01/21/2016 1030      Component Value Date/Time   CALCIUM 9.1 12/04/2019 0947   CALCIUM 7.0 (L) 10/09/2015 0458   ALKPHOS 89 12/04/2019 0947   AST 24 12/04/2019 0947   ALT 14 12/04/2019 0947   BILITOT 0.4 12/04/2019 0947        Impression and Plan:  74 year old man with:  1.  T3N2 adenocarcinoma of the rectum diagnosed in March 2021.   He is currently receiving neoadjuvant chemotherapy utilizing FOLFOX with few complications.  He has reported fatigue, tiredness and grade 1 neuropathy.  Risks and benefits of completing 8 cycles of therapy were discussed.  Potential long-term complications were reiterated including worsening neuropathy.  After discussion today, he is agreeable to proceed with cycle 6 and the goal is to get him to cycle 8 if he can tolerate that.  2.  IV access: Portacath continues to be accessed without any issues.  3.  Antiemetics: Compazine is available to him managing his nausea.  4.  Neuropathy: None reported at this time we will continue to monitor on oxaliplatin.  5.  Neutropenia: Improved with a dose reduction and growth factor support.  6.  Thrombocytopenia: Related to chemotherapy and that we will continue to monitor.  No active bleeding noted.  Platelet count today is above 100,000 and does not require any intervention.  7.  Follow-up: He will return in 2 weeks for repeat evaluation.  30  minutes were spent on this encounter.  The time was dedicated to updating his disease status, discussing treatment options and outlining future plan of care review.    [name], MD 7/28/202111:25 AM

## 2019-12-18 NOTE — Patient Instructions (Signed)
[facility] Discharge Instructions for Patients Receiving Chemotherapy  Today you received the following chemotherapy agents Oxaliplatin (ELOXATIN), Leucovorin & Flourouracil (ADRUCIL).  To help prevent nausea and vomiting after your treatment, we encourage you to take your nausea medication as prescribed.   If you develop nausea and vomiting that is not controlled by your nausea medication, call the clinic.   BELOW ARE SYMPTOMS THAT SHOULD BE REPORTED IMMEDIATELY:  *FEVER GREATER THAN 100.5 F  *CHILLS WITH OR WITHOUT FEVER  NAUSEA AND VOMITING THAT IS NOT CONTROLLED WITH YOUR NAUSEA MEDICATION  *UNUSUAL SHORTNESS OF BREATH  *UNUSUAL BRUISING OR BLEEDING  TENDERNESS IN MOUTH AND THROAT WITH OR WITHOUT PRESENCE OF ULCERS  *URINARY PROBLEMS  *BOWEL PROBLEMS  UNUSUAL RASH Items with * indicate a potential emergency and should be followed up as soon as possible.  Feel free to call the clinic should you have any questions or concerns. The clinic phone number is [phone].  Please show the CHEMO ALERT CARD at check-in to the Emergency Department and triage nurse.   

## 2019-12-19 ENCOUNTER — Telehealth: Payer: Self-pay | Admitting: Nutrition

## 2019-12-19 ENCOUNTER — Inpatient Hospital Stay: Payer: Medicare Other | Admitting: Nutrition

## 2019-12-19 ENCOUNTER — Encounter: Payer: Self-pay | Admitting: Gastroenterology

## 2019-12-19 NOTE — Progress Notes (Signed)
See telephone note.

## 2019-12-19 NOTE — Telephone Encounter (Signed)
Nutrition follow-up completed over the telephone with patient receiving chemotherapy for rectal cancer. Weight decreased and documented as 158.1 pounds on July 28 down from 162 pounds June 22. Noted labs: Glucose 162, creatinine 1.55, and albumin 3.4.  Patient reports he has eaten better after he had a 3-week treatment break. Reports his biggest challenge is taste alterations and cold sensitivity. He denies other nutrition impact symptoms.  Nutrition diagnosis: Inadequate oral intake continues.  Intervention: Patient educated on strategies to improve taste alterations.  I especially stressed importance of trying a baking soda and salt water rinses.  Patient is agreeable. Reviewed strategies for improving oral intake with cold sensitivity. Questions were answered.  Teach back method used. I will mail fact sheets to patient with my contact information for further questions.  Monitoring, evaluation, goals: Patient will tolerate increased calories and protein to minimize further weight loss.  Next visit: To be scheduled as needed.  **Disclaimer: This note was dictated with voice recognition software. Similar sounding words can inadvertently be transcribed and this note may contain transcription errors which may not have been corrected upon publication of note.**

## 2019-12-20 ENCOUNTER — Inpatient Hospital Stay: Payer: Medicare Other

## 2019-12-20 ENCOUNTER — Other Ambulatory Visit: Payer: Self-pay

## 2019-12-20 VITALS — BP 127/67 | HR 56 | Temp 98.4°F | Resp 16

## 2019-12-20 DIAGNOSIS — Z8249 Family history of ischemic heart disease and other diseases of the circulatory system: Secondary | ICD-10-CM | POA: Diagnosis not present

## 2019-12-20 DIAGNOSIS — C20 Malignant neoplasm of rectum: Secondary | ICD-10-CM | POA: Diagnosis not present

## 2019-12-20 DIAGNOSIS — R110 Nausea: Secondary | ICD-10-CM | POA: Diagnosis not present

## 2019-12-20 DIAGNOSIS — R5383 Other fatigue: Secondary | ICD-10-CM | POA: Diagnosis not present

## 2019-12-20 DIAGNOSIS — Z5189 Encounter for other specified aftercare: Secondary | ICD-10-CM | POA: Diagnosis not present

## 2019-12-20 DIAGNOSIS — Z79899 Other long term (current) drug therapy: Secondary | ICD-10-CM | POA: Diagnosis not present

## 2019-12-20 DIAGNOSIS — K219 Gastro-esophageal reflux disease without esophagitis: Secondary | ICD-10-CM | POA: Diagnosis not present

## 2019-12-20 DIAGNOSIS — D696 Thrombocytopenia, unspecified: Secondary | ICD-10-CM | POA: Diagnosis not present

## 2019-12-20 DIAGNOSIS — G620 Drug-induced polyneuropathy: Secondary | ICD-10-CM | POA: Diagnosis not present

## 2019-12-20 DIAGNOSIS — D709 Neutropenia, unspecified: Secondary | ICD-10-CM | POA: Diagnosis not present

## 2019-12-20 DIAGNOSIS — R590 Localized enlarged lymph nodes: Secondary | ICD-10-CM | POA: Diagnosis not present

## 2019-12-20 DIAGNOSIS — Z5111 Encounter for antineoplastic chemotherapy: Secondary | ICD-10-CM | POA: Diagnosis not present

## 2019-12-20 DIAGNOSIS — T451X5A Adverse effect of antineoplastic and immunosuppressive drugs, initial encounter: Secondary | ICD-10-CM | POA: Diagnosis not present

## 2019-12-20 MED ORDER — PEGFILGRASTIM-CBQV 6 MG/0.6ML ~~LOC~~ SOSY
PREFILLED_SYRINGE | SUBCUTANEOUS | Status: AC
  Filled 2019-12-20: qty 0.6

## 2019-12-20 MED ORDER — PEGFILGRASTIM-CBQV 6 MG/0.6ML ~~LOC~~ SOSY
6.0000 mg | PREFILLED_SYRINGE | Freq: Once | SUBCUTANEOUS | Status: AC
  Administered 2019-12-20: 6 mg via SUBCUTANEOUS

## 2019-12-20 MED ORDER — SODIUM CHLORIDE 0.9% FLUSH
10.0000 mL | INTRAVENOUS | Status: DC | PRN
  Administered 2019-12-20: 10 mL
  Filled 2019-12-20: qty 10

## 2019-12-20 MED ORDER — HEPARIN SOD (PORK) LOCK FLUSH 100 UNIT/ML IV SOLN
500.0000 [IU] | Freq: Once | INTRAVENOUS | Status: AC | PRN
  Administered 2019-12-20: 500 [IU]
  Filled 2019-12-20: qty 5

## 2019-12-20 NOTE — Patient Instructions (Signed)

## 2019-12-23 DIAGNOSIS — N1831 Chronic kidney disease, stage 3a: Secondary | ICD-10-CM | POA: Diagnosis not present

## 2019-12-23 DIAGNOSIS — N028 Recurrent and persistent hematuria with other morphologic changes: Secondary | ICD-10-CM | POA: Diagnosis not present

## 2019-12-23 DIAGNOSIS — I129 Hypertensive chronic kidney disease with stage 1 through stage 4 chronic kidney disease, or unspecified chronic kidney disease: Secondary | ICD-10-CM | POA: Diagnosis not present

## 2019-12-23 DIAGNOSIS — D631 Anemia in chronic kidney disease: Secondary | ICD-10-CM | POA: Diagnosis not present

## 2019-12-25 ENCOUNTER — Ambulatory Visit: Payer: Medicare Other | Admitting: Oncology

## 2019-12-25 ENCOUNTER — Ambulatory Visit: Payer: Medicare Other

## 2019-12-25 ENCOUNTER — Other Ambulatory Visit: Payer: Medicare Other

## 2019-12-27 ENCOUNTER — Encounter: Payer: Medicare Other | Admitting: Gastroenterology

## 2019-12-27 ENCOUNTER — Ambulatory Visit: Payer: Medicare Other

## 2019-12-31 MED FILL — Dexamethasone Sodium Phosphate Inj 100 MG/10ML: INTRAMUSCULAR | Qty: 1 | Status: AC

## 2020-01-01 ENCOUNTER — Inpatient Hospital Stay: Payer: Medicare Other | Attending: Oncology

## 2020-01-01 ENCOUNTER — Other Ambulatory Visit: Payer: Self-pay

## 2020-01-01 ENCOUNTER — Inpatient Hospital Stay: Payer: Medicare Other

## 2020-01-01 ENCOUNTER — Telehealth: Payer: Self-pay | Admitting: Oncology

## 2020-01-01 VITALS — BP 136/60 | HR 56 | Temp 98.3°F | Resp 20 | Wt 157.0 lb

## 2020-01-01 DIAGNOSIS — C20 Malignant neoplasm of rectum: Secondary | ICD-10-CM | POA: Diagnosis not present

## 2020-01-01 DIAGNOSIS — Z23 Encounter for immunization: Secondary | ICD-10-CM | POA: Diagnosis not present

## 2020-01-01 DIAGNOSIS — Z79899 Other long term (current) drug therapy: Secondary | ICD-10-CM | POA: Diagnosis not present

## 2020-01-01 DIAGNOSIS — G629 Polyneuropathy, unspecified: Secondary | ICD-10-CM | POA: Diagnosis not present

## 2020-01-01 DIAGNOSIS — D696 Thrombocytopenia, unspecified: Secondary | ICD-10-CM | POA: Insufficient documentation

## 2020-01-01 DIAGNOSIS — Z95828 Presence of other vascular implants and grafts: Secondary | ICD-10-CM

## 2020-01-01 DIAGNOSIS — Z5111 Encounter for antineoplastic chemotherapy: Secondary | ICD-10-CM | POA: Insufficient documentation

## 2020-01-01 DIAGNOSIS — D709 Neutropenia, unspecified: Secondary | ICD-10-CM | POA: Diagnosis not present

## 2020-01-01 DIAGNOSIS — R590 Localized enlarged lymph nodes: Secondary | ICD-10-CM | POA: Insufficient documentation

## 2020-01-01 DIAGNOSIS — Z5189 Encounter for other specified aftercare: Secondary | ICD-10-CM | POA: Insufficient documentation

## 2020-01-01 LAB — CBC WITH DIFFERENTIAL (CANCER CENTER ONLY)
Abs Immature Granulocytes: 0.08 10*3/uL — ABNORMAL HIGH (ref 0.00–0.07)
Basophils Absolute: 0 10*3/uL (ref 0.0–0.1)
Basophils Relative: 1 %
Eosinophils Absolute: 0.2 10*3/uL (ref 0.0–0.5)
Eosinophils Relative: 3 %
HCT: 33.7 % — ABNORMAL LOW (ref 39.0–52.0)
Hemoglobin: 10.8 g/dL — ABNORMAL LOW (ref 13.0–17.0)
Immature Granulocytes: 1 %
Lymphocytes Relative: 17 %
Lymphs Abs: 1.2 10*3/uL (ref 0.7–4.0)
MCH: 32.7 pg (ref 26.0–34.0)
MCHC: 32 g/dL (ref 30.0–36.0)
MCV: 102.1 fL — ABNORMAL HIGH (ref 80.0–100.0)
Monocytes Absolute: 0.6 10*3/uL (ref 0.1–1.0)
Monocytes Relative: 8 %
Neutro Abs: 4.8 10*3/uL (ref 1.7–7.7)
Neutrophils Relative %: 70 %
Platelet Count: 69 10*3/uL — ABNORMAL LOW (ref 150–400)
RBC: 3.3 MIL/uL — ABNORMAL LOW (ref 4.22–5.81)
RDW: 15.2 % (ref 11.5–15.5)
WBC Count: 6.9 10*3/uL (ref 4.0–10.5)
nRBC: 0 % (ref 0.0–0.2)

## 2020-01-01 LAB — CMP (CANCER CENTER ONLY)
ALT: 17 U/L (ref 0–44)
AST: 22 U/L (ref 15–41)
Albumin: 3.4 g/dL — ABNORMAL LOW (ref 3.5–5.0)
Alkaline Phosphatase: 112 U/L (ref 38–126)
Anion gap: 8 (ref 5–15)
BUN: 18 mg/dL (ref 8–23)
CO2: 23 mmol/L (ref 22–32)
Calcium: 9.4 mg/dL (ref 8.9–10.3)
Chloride: 111 mmol/L (ref 98–111)
Creatinine: 1.52 mg/dL — ABNORMAL HIGH (ref 0.61–1.24)
GFR, Est AFR Am: 52 mL/min — ABNORMAL LOW (ref >60)
GFR, Estimated: 45 mL/min — ABNORMAL LOW (ref >60)
Glucose, Bld: 134 mg/dL — ABNORMAL HIGH (ref 70–99)
Potassium: 4 mmol/L (ref 3.5–5.1)
Sodium: 142 mmol/L (ref 135–145)
Total Bilirubin: 0.4 mg/dL (ref 0.3–1.2)
Total Protein: 6.7 g/dL (ref 6.5–8.1)

## 2020-01-01 LAB — CEA (IN HOUSE-CHCC): CEA (CHCC-In House): 2.01 ng/mL (ref 0.00–5.00)

## 2020-01-01 MED ORDER — PALONOSETRON HCL INJECTION 0.25 MG/5ML
INTRAVENOUS | Status: AC
  Filled 2020-01-01: qty 5

## 2020-01-01 MED ORDER — SODIUM CHLORIDE 0.9% FLUSH
10.0000 mL | Freq: Once | INTRAVENOUS | Status: AC
  Administered 2020-01-01: 10 mL
  Filled 2020-01-01: qty 10

## 2020-01-01 MED ORDER — HEPARIN SOD (PORK) LOCK FLUSH 100 UNIT/ML IV SOLN
500.0000 [IU] | Freq: Once | INTRAVENOUS | Status: AC
  Administered 2020-01-01: 500 [IU]
  Filled 2020-01-01: qty 5

## 2020-01-01 NOTE — Progress Notes (Signed)
Dr. Alen Blew informed of platelets 69 and creatine 1.52 today. MD requesting to hold today's treatment. No new appointments needed.

## 2020-01-01 NOTE — Telephone Encounter (Signed)
Called patient regarding upcoming appointments, patient is aware. °

## 2020-01-01 NOTE — Patient Instructions (Addendum)
Managing Low Blood Counts During Cancer Treatment Cancer treatments such as chemotherapy and radiation can sometimes cause a drop in the supply of blood cells in the body, including red blood cells, white blood cells, and platelets. These blood cells are produced in the body and are released into the blood to perform specific functions:  Red blood cells carry gases such as oxygen and carbon dioxide to and from your lungs.  White blood cells help protect you from infection.  Platelets help your body to form blood clots to prevent and control bleeding. When cancer treatments cause a drop in blood cell counts, your body may not have enough cells to keep up its normal functions. Symptoms or problems that may result will vary depending on which type of blood cells the treatment is affecting. If your blood counts are low, you can take steps to help manage any problems. How can low blood counts affect me? Low blood counts have various effects depending on the type of blood cells involved:  If you have a low number of red blood cells, you have a condition called anemia. This can cause symptoms such as: ? Feeling tired and weak. ? Feeling light-headed. ? Being short of breath.  If you have a low number of white blood cells, you may be at higher risk for infections.  If you have a low number of platelets, you may bleed more easily, or your body may have trouble stopping any bleeding. You may also have more bruising. How to manage symptoms or prevent problems from a low blood count If you have a low blood count, you can take steps to manage symptoms or prevent problems that may develop. The steps to take will depend on which type of blood cell is low. Low red blood cells Take these steps to help manage the symptoms of anemia:  Go for a walk or do some light exercise each day.  Take short naps during the day.  Eat foods that contain a lot of iron and protein. These include leafy vegetables, meat and  fish, beans, sweet potatoes, and dried fruit such as prunes, raisins, and apricots.  Ask for help with errands and with work that needs to be done around the house. It is important to save your energy.  Take vitamins or supplements--such as iron, vitamin B12, or folic acid--as told by your health care provider.  Practice relaxation techniques, such as yoga or meditation. Low white blood cells Take these steps to help prevent infections:  Wash your hands often with warm, soapy water.  Avoid crowds of people and any person who has the flu or a fever.  Take care when cleaning yourself after using the bathroom. Tell your health care provider if you have any rectal sores or bleeding.  Avoid dental work. Check your mouth each day for sores or signs of infection.  Do not share utensils.  Avoid contact with pet waste. Wash your hands after handling pets.  If you get a scrape or cut, clean it thoroughly right away.  Avoid fresh plants or dried flowers.  Do not swim or wade in lakes, ponds, rivers, water parks, or hot tubs.  Follow food safety guidelines. Cook meat thoroughly and wash all raw fruits and vegetables.  You may be instructed to wear a mask when around others to protect yourself.  Low platelets Take these steps to help prevent or control bleeding and bruising:  Use an electric razor for shaving instead of a blade.  Use   a soft toothbrush and be careful during oral care. Talk with your cancer care team about whether you should avoid flossing. If your mouth is bleeding, rinse it with ice water.  Avoid activities that could cause injury, such as contact sports.  Talk with your health care provider about using laxatives or stool softeners to avoid constipation.  Do not use medicines such as ibuprofen, aspirin, or naproxen unless your health care provider tells you to.  Limit alcohol use.  Monitor any bleeding closely. If you start bleeding, hold pressure on the area for 5  minutes to stop the bleeding. Bleeding that does not stop is considered an emergency. What treatments can help increase a low blood count? If needed, your health care provider may recommend treatment for a low blood count. Treatment will depend on the type of blood cell that is low and the severity of your condition. Treatment options may include:  Taking medicines to help stimulate the growth of blood cells. This is an option for treating a low red blood cell count. Your health care provider may also recommend that you take iron, folic acid, or vitamin B12 supplements.  Making dietary changes. Including more iron and protein in your diet can help stimulate the growth of red blood cells.  Adjusting your current medicines to help raise blood counts.  Making changes to your treatment plan.  Having a blood transfusion. This may be done if your blood count is very low. Contact a health care provider if:  You feel extremely tired and weak.  You have more bruising or bleeding.  You feel ill or you develop a cough.  You have swelling or redness.  You have mouth sores or a sore throat.  You have painful urination or you have blood in your urine or stool.  You are thinking of taking any new supplements or vitamins or making dietary changes. Get help right away if:  You are short of breath, have chest pain, or feel dizzy.  You have a fever or chills.  You have abdominal pain or diarrhea.  You have bleeding that will not stop. Summary  Cancer treatments such as chemotherapy and radiation can sometimes cause a drop in the supply of blood cells in the body, including red blood cells, white blood cells, and platelets.  If you have a low blood count, you can take steps to manage symptoms or prevent problems that may develop.  Depending on which type of blood cell is low, you may need to take steps to prevent infection, prevent bleeding, or manage symptoms that may develop.  If needed,  your health care provider may recommend treatment for a low blood count. This information is not intended to replace advice given to you by your health care provider. Make sure you discuss any questions you have with your health care provider. Document Revised: 05/26/2016 Document Reviewed: 01/02/2016 Elsevier Patient Education  2020 [employer] Inc.  

## 2020-01-03 ENCOUNTER — Inpatient Hospital Stay: Payer: Medicare Other

## 2020-01-03 ENCOUNTER — Ambulatory Visit: Payer: Medicare Other

## 2020-01-05 DIAGNOSIS — C189 Malignant neoplasm of colon, unspecified: Secondary | ICD-10-CM | POA: Diagnosis not present

## 2020-01-15 ENCOUNTER — Inpatient Hospital Stay: Payer: Medicare Other

## 2020-01-15 ENCOUNTER — Other Ambulatory Visit: Payer: Self-pay

## 2020-01-15 ENCOUNTER — Inpatient Hospital Stay (HOSPITAL_BASED_OUTPATIENT_CLINIC_OR_DEPARTMENT_OTHER): Payer: Medicare Other | Admitting: Oncology

## 2020-01-15 VITALS — BP 168/79 | HR 60 | Temp 98.4°F | Resp 18 | Wt 158.9 lb

## 2020-01-15 DIAGNOSIS — Z23 Encounter for immunization: Secondary | ICD-10-CM | POA: Diagnosis not present

## 2020-01-15 DIAGNOSIS — C20 Malignant neoplasm of rectum: Secondary | ICD-10-CM

## 2020-01-15 DIAGNOSIS — D696 Thrombocytopenia, unspecified: Secondary | ICD-10-CM | POA: Diagnosis not present

## 2020-01-15 DIAGNOSIS — Z5189 Encounter for other specified aftercare: Secondary | ICD-10-CM | POA: Diagnosis not present

## 2020-01-15 DIAGNOSIS — Z95828 Presence of other vascular implants and grafts: Secondary | ICD-10-CM

## 2020-01-15 DIAGNOSIS — G629 Polyneuropathy, unspecified: Secondary | ICD-10-CM | POA: Diagnosis not present

## 2020-01-15 DIAGNOSIS — C189 Malignant neoplasm of colon, unspecified: Secondary | ICD-10-CM | POA: Diagnosis not present

## 2020-01-15 DIAGNOSIS — D709 Neutropenia, unspecified: Secondary | ICD-10-CM | POA: Diagnosis not present

## 2020-01-15 DIAGNOSIS — Z5111 Encounter for antineoplastic chemotherapy: Secondary | ICD-10-CM | POA: Diagnosis not present

## 2020-01-15 DIAGNOSIS — R590 Localized enlarged lymph nodes: Secondary | ICD-10-CM | POA: Diagnosis not present

## 2020-01-15 DIAGNOSIS — Z79899 Other long term (current) drug therapy: Secondary | ICD-10-CM | POA: Diagnosis not present

## 2020-01-15 LAB — CBC WITH DIFFERENTIAL (CANCER CENTER ONLY)
Abs Immature Granulocytes: 0.02 10*3/uL (ref 0.00–0.07)
Basophils Absolute: 0.1 10*3/uL (ref 0.0–0.1)
Basophils Relative: 1 %
Eosinophils Absolute: 0.1 10*3/uL (ref 0.0–0.5)
Eosinophils Relative: 2 %
HCT: 35.1 % — ABNORMAL LOW (ref 39.0–52.0)
Hemoglobin: 11.5 g/dL — ABNORMAL LOW (ref 13.0–17.0)
Immature Granulocytes: 0 %
Lymphocytes Relative: 26 %
Lymphs Abs: 1.3 10*3/uL (ref 0.7–4.0)
MCH: 33.5 pg (ref 26.0–34.0)
MCHC: 32.8 g/dL (ref 30.0–36.0)
MCV: 102.3 fL — ABNORMAL HIGH (ref 80.0–100.0)
Monocytes Absolute: 0.5 10*3/uL (ref 0.1–1.0)
Monocytes Relative: 11 %
Neutro Abs: 3 10*3/uL (ref 1.7–7.7)
Neutrophils Relative %: 60 %
Platelet Count: 95 10*3/uL — ABNORMAL LOW (ref 150–400)
RBC: 3.43 MIL/uL — ABNORMAL LOW (ref 4.22–5.81)
RDW: 14.6 % (ref 11.5–15.5)
WBC Count: 5 10*3/uL (ref 4.0–10.5)
nRBC: 0 % (ref 0.0–0.2)

## 2020-01-15 LAB — CMP (CANCER CENTER ONLY)
ALT: 17 U/L (ref 0–44)
AST: 25 U/L (ref 15–41)
Albumin: 3.4 g/dL — ABNORMAL LOW (ref 3.5–5.0)
Alkaline Phosphatase: 87 U/L (ref 38–126)
Anion gap: 9 (ref 5–15)
BUN: 17 mg/dL (ref 8–23)
CO2: 23 mmol/L (ref 22–32)
Calcium: 9.3 mg/dL (ref 8.9–10.3)
Chloride: 110 mmol/L (ref 98–111)
Creatinine: 1.38 mg/dL — ABNORMAL HIGH (ref 0.61–1.24)
GFR, Est AFR Am: 58 mL/min — ABNORMAL LOW (ref >60)
GFR, Estimated: 50 mL/min — ABNORMAL LOW (ref >60)
Glucose, Bld: 149 mg/dL — ABNORMAL HIGH (ref 70–99)
Potassium: 3.6 mmol/L (ref 3.5–5.1)
Sodium: 142 mmol/L (ref 135–145)
Total Bilirubin: 0.3 mg/dL (ref 0.3–1.2)
Total Protein: 6.8 g/dL (ref 6.5–8.1)

## 2020-01-15 LAB — CEA (IN HOUSE-CHCC): CEA (CHCC-In House): 2.08 ng/mL (ref 0.00–5.00)

## 2020-01-15 MED ORDER — DEXTROSE 5 % IV SOLN
Freq: Once | INTRAVENOUS | Status: AC
  Filled 2020-01-15: qty 250

## 2020-01-15 MED ORDER — PALONOSETRON HCL INJECTION 0.25 MG/5ML
INTRAVENOUS | Status: AC
  Filled 2020-01-15: qty 5

## 2020-01-15 MED ORDER — SODIUM CHLORIDE 0.9 % IV SOLN
2400.0000 mg/m2 | INTRAVENOUS | Status: DC
  Administered 2020-01-15: 4650 mg via INTRAVENOUS
  Filled 2020-01-15: qty 93

## 2020-01-15 MED ORDER — LEUCOVORIN CALCIUM INJECTION 350 MG
400.0000 mg/m2 | Freq: Once | INTRAVENOUS | Status: AC
  Administered 2020-01-15: 772 mg via INTRAVENOUS
  Filled 2020-01-15: qty 38.6

## 2020-01-15 MED ORDER — SODIUM CHLORIDE 0.9 % IV SOLN
10.0000 mg | Freq: Once | INTRAVENOUS | Status: AC
  Administered 2020-01-15: 10 mg via INTRAVENOUS
  Filled 2020-01-15: qty 10

## 2020-01-15 MED ORDER — FLUOROURACIL CHEMO INJECTION 2.5 GM/50ML
400.0000 mg/m2 | Freq: Once | INTRAVENOUS | Status: AC
  Administered 2020-01-15: 750 mg via INTRAVENOUS
  Filled 2020-01-15: qty 15

## 2020-01-15 MED ORDER — OXALIPLATIN CHEMO INJECTION 100 MG/20ML
64.0000 mg/m2 | Freq: Once | INTRAVENOUS | Status: AC
  Administered 2020-01-15: 125 mg via INTRAVENOUS
  Filled 2020-01-15: qty 20

## 2020-01-15 MED ORDER — SODIUM CHLORIDE 0.9% FLUSH
10.0000 mL | Freq: Once | INTRAVENOUS | Status: AC
  Administered 2020-01-15: 10 mL
  Filled 2020-01-15: qty 10

## 2020-01-15 MED ORDER — PALONOSETRON HCL INJECTION 0.25 MG/5ML
0.2500 mg | Freq: Once | INTRAVENOUS | Status: AC
  Administered 2020-01-15: 0.25 mg via INTRAVENOUS

## 2020-01-15 NOTE — Progress Notes (Signed)
Hematology and Oncology Follow Up Visit  Jared White [id number] [birth year] 74 y.o. 01/15/2020 8:38 AM Jared White, MDGreene, [name], MD   Principle Diagnosis: 74 year old man with rectal cancer diagnosed in March 2021.  He was found to have T3N2 disease with pelvic adenopathy..   Prior Therapy: He is status post colonoscopy and endoscopy completed on August 13, 2019.  Rectal biopsy showed an invasive adenocarcinoma.  Current therapy: Neoadjuvant chemotherapy utilizing FOLFOX started on September 05 2019.  He is here for cycle 7 of therapy.  Interim History: Jared White presents today for return evaluation.  Since the last visit, he reports no major changes in his health.  He denies any nausea, vomiting or abdominal pain.  He denies any worsening neuropathy.  He does report some mild fatigue and tiredness but his overall performance status quality of life remains maintained.    Medications: Updated on review. Current Outpatient Medications  Medication Sig Dispense Refill  . aspirin EC 81 MG tablet Take 1 tablet (81 mg total) by mouth daily. 90 tablet 3  . atenolol (TENORMIN) 50 MG tablet TAKE 1 TABLET BY MOUTH EVERY DAY 90 tablet 2  . calcium carbonate (OSCAL) 1500 (600 Ca) MG TABS tablet Take by mouth daily.    . clotrimazole-betamethasone (LOTRISONE) cream Apply 1 application topically 2 (two) times daily. 30 g 0  . famotidine (PEPCID) 20 MG tablet Take 1 tablet (20 mg total) by mouth 2 (two) times daily. 60 tablet 5  . fluticasone (FLONASE) 50 MCG/ACT nasal spray INSTILL 2 SPRAYS INTO EACH NOSTRIL EVERY DAY 48 g 2  . lidocaine-prilocaine (EMLA) cream Apply 1 application topically as needed. 30 g 0  . Magnesium Oxide 400 MG CAPS Take 1 capsule (400 mg total) by mouth 2 (two) times daily. 60 capsule 0  . nitroGLYCERIN (NITROSTAT) 0.4 MG SL tablet SMARTSIG:2 Tablet(s) Sublingual PRN    . Omega-3 Fatty Acids (OMEGA 3 PO) Take 520 mg by mouth daily.     Marland Kitchen omeprazole (PRILOSEC) 40 MG  capsule Take 1 capsule (40 mg total) by mouth 2 (two) times daily. 90 capsule 3  . prochlorperazine (COMPAZINE) 10 MG tablet Take 1 tablet (10 mg total) by mouth every 6 (six) hours as needed for nausea or vomiting. 30 tablet 0  . rosuvastatin (CRESTOR) 20 MG tablet TAKE 1 TABLET BY MOUTH EVERY DAY 90 tablet 2  . valsartan (DIOVAN) 160 MG tablet TAKE 1 TABLET BY MOUTH ONE TIME DAILY. 90 tablet 2  . vitamin B-12 (CYANOCOBALAMIN) 1000 MCG tablet Take 1,000 mcg by mouth daily.     No current facility-administered medications for this visit.     Allergies: No Known Allergies    Physical Exam:    Blood pressure (!) 168/79, pulse 60, temperature 98.4 F (36.9 C), temperature source Oral, resp. rate 18, weight 158 lb 14.4 oz (72.1 kg), SpO2 100 %.     ECOG: 1    General appearance: Alert, awake without any distress. Head: Atraumatic without abnormalities Oropharynx: Without any thrush or ulcers. Eyes: No scleral icterus. Lymph nodes: No lymphadenopathy noted in the cervical, supraclavicular, or axillary nodes Heart:regular rate and rhythm, without any murmurs or gallops.   Lung: Clear to auscultation without any rhonchi, wheezes or dullness to percussion. Abdomin: Soft, nontender without any shifting dullness or ascites. Musculoskeletal: No clubbing or cyanosis. Neurological: No motor or sensory deficits. Skin: No rashes or lesions.           Lab Results: Lab Results  Component Value Date   WBC 5.0 01/15/2020   HGB 11.5 (L) 01/15/2020   HCT 35.1 (L) 01/15/2020   MCV 102.3 (H) 01/15/2020   PLT 95 (L) 01/15/2020     Chemistry      Component Value Date/Time   NA 142 01/01/2020 1120   NA 140 12/09/2016 0932   K 4.0 01/01/2020 1120   CL 111 01/01/2020 1120   CO2 23 01/01/2020 1120   BUN 18 01/01/2020 1120   BUN 29 (H) 12/09/2016 0932   CREATININE 1.52 (H) 01/01/2020 1120   CREATININE 1.61 (H) 01/21/2016 1030      Component Value Date/Time   CALCIUM 9.4  01/01/2020 1120   CALCIUM 7.0 (L) 10/09/2015 0458   ALKPHOS 112 01/01/2020 1120   AST 22 01/01/2020 1120   ALT 17 01/01/2020 1120   BILITOT 0.4 01/01/2020 1120        Impression and Plan:  74 year old man with:  1.  Rectal cancer diagnosed in March 2021.  He was found to have T3N2 disease.  The natural course of his disease and treatment plan was outlined at this time.  He is ready to proceed with cycle 7 of neoadjuvant chemotherapy with FOLFOX.  Risks and benefits of continuing this treatment were reviewed today and after discussion we have elected to proceed with cycle 7 which would be his last cycle given his overall tolerance and cytopenia.  We will update his staging in the next few weeks and will arrange evaluation by radiation oncology to transition to the next phase of therapy utilizing radiation with chemotherapy.  It would be a reasonable candidate to switch to Xeloda orally with radiation concomitantly.  2.  IV access: No issues reported with his Port-A-Cath.  This will continue to be used and flushed periodically.  3.  Antiemetics: No nausea or vomiting reported at this time.  Compazine is available to him.  4.  Neuropathy: Very faint neuropathy noted.  No further adjustment of oxaliplatin at this time.  5.  Neutropenia: Related to chemotherapy and will require growth factor support after each cycle of therapy.  6.  Thrombocytopenia: Blood count improved at this time without any active bleeding.  We will proceed with chemotherapy given his close count at baseline.  7.  Follow-up: In the next few weeks for repeat evaluation after imaging studies.  30  minutes were dedicated to this visit.  The time was spent on reviewing the natural course of his disease, treatment options and addressing complications related to his current therapy.    [name], MD 8/25/20218:38 AM

## 2020-01-15 NOTE — Patient Instructions (Signed)
[facility] Discharge Instructions for Patients Receiving Chemotherapy  Today you received the following chemotherapy agents Oxaliplatin (ELOXATIN), Leucovorin & Flourouracil (ADRUCIL).  To help prevent nausea and vomiting after your treatment, we encourage you to take your nausea medication as prescribed.   If you develop nausea and vomiting that is not controlled by your nausea medication, call the clinic.   BELOW ARE SYMPTOMS THAT SHOULD BE REPORTED IMMEDIATELY:  *FEVER GREATER THAN 100.5 F  *CHILLS WITH OR WITHOUT FEVER  NAUSEA AND VOMITING THAT IS NOT CONTROLLED WITH YOUR NAUSEA MEDICATION  *UNUSUAL SHORTNESS OF BREATH  *UNUSUAL BRUISING OR BLEEDING  TENDERNESS IN MOUTH AND THROAT WITH OR WITHOUT PRESENCE OF ULCERS  *URINARY PROBLEMS  *BOWEL PROBLEMS  UNUSUAL RASH Items with * indicate a potential emergency and should be followed up as soon as possible.  Feel free to call the clinic should you have any questions or concerns. The clinic phone number is [phone].  Please show the CHEMO ALERT CARD at check-in to the Emergency Department and triage nurse.   

## 2020-01-15 NOTE — Progress Notes (Signed)
Per Dr. Alen Blew: okay to treat with Plt of 95.

## 2020-01-17 ENCOUNTER — Inpatient Hospital Stay: Payer: Medicare Other

## 2020-01-17 ENCOUNTER — Telehealth: Payer: Self-pay | Admitting: Oncology

## 2020-01-17 ENCOUNTER — Other Ambulatory Visit: Payer: Self-pay

## 2020-01-17 VITALS — BP 129/61 | HR 62 | Temp 98.8°F | Resp 16

## 2020-01-17 DIAGNOSIS — G629 Polyneuropathy, unspecified: Secondary | ICD-10-CM | POA: Diagnosis not present

## 2020-01-17 DIAGNOSIS — D709 Neutropenia, unspecified: Secondary | ICD-10-CM | POA: Diagnosis not present

## 2020-01-17 DIAGNOSIS — Z23 Encounter for immunization: Secondary | ICD-10-CM | POA: Diagnosis not present

## 2020-01-17 DIAGNOSIS — Z5189 Encounter for other specified aftercare: Secondary | ICD-10-CM | POA: Diagnosis not present

## 2020-01-17 DIAGNOSIS — Z79899 Other long term (current) drug therapy: Secondary | ICD-10-CM | POA: Diagnosis not present

## 2020-01-17 DIAGNOSIS — C20 Malignant neoplasm of rectum: Secondary | ICD-10-CM | POA: Diagnosis not present

## 2020-01-17 DIAGNOSIS — D696 Thrombocytopenia, unspecified: Secondary | ICD-10-CM | POA: Diagnosis not present

## 2020-01-17 DIAGNOSIS — R590 Localized enlarged lymph nodes: Secondary | ICD-10-CM | POA: Diagnosis not present

## 2020-01-17 DIAGNOSIS — Z5111 Encounter for antineoplastic chemotherapy: Secondary | ICD-10-CM | POA: Diagnosis not present

## 2020-01-17 MED ORDER — PEGFILGRASTIM-CBQV 6 MG/0.6ML ~~LOC~~ SOSY
PREFILLED_SYRINGE | SUBCUTANEOUS | Status: AC
  Filled 2020-01-17: qty 0.6

## 2020-01-17 MED ORDER — PEGFILGRASTIM-CBQV 6 MG/0.6ML ~~LOC~~ SOSY
6.0000 mg | PREFILLED_SYRINGE | Freq: Once | SUBCUTANEOUS | Status: AC
  Administered 2020-01-17: 6 mg via SUBCUTANEOUS

## 2020-01-17 MED ORDER — SODIUM CHLORIDE 0.9% FLUSH
10.0000 mL | INTRAVENOUS | Status: DC | PRN
  Administered 2020-01-17: 10 mL
  Filled 2020-01-17: qty 10

## 2020-01-17 MED ORDER — HEPARIN SOD (PORK) LOCK FLUSH 100 UNIT/ML IV SOLN
500.0000 [IU] | Freq: Once | INTRAVENOUS | Status: AC | PRN
  Administered 2020-01-17: 500 [IU]
  Filled 2020-01-17: qty 5

## 2020-01-17 NOTE — Progress Notes (Signed)
   Covid-19 Vaccination Clinic  Name:  Jared White    MRN: [id number] DOB: [birth year]  01/17/2020  Jared White was observed post Covid-19 immunization for 15 minutes without incident. He was provided with Vaccine Information Sheet and instruction to access the V-Safe system.   Jared White was instructed to call 911 with any severe reactions post vaccine: Marland Kitchen Difficulty breathing  . Swelling of face and throat  . A fast heartbeat  . A bad rash all over body  . Dizziness and weakness   Immunizations Administered    Name Date Dose VIS Date Route   Pfizer COVID-19 Vaccine 01/17/2020 12:22 PM 0.3 mL 07/17/2018 Intramuscular   Manufacturer: [employer], [employer]   Lot: [id number]   [place]: [id number]

## 2020-01-17 NOTE — Telephone Encounter (Signed)
Scheduled per 08/25 los, patient has been called and notified.

## 2020-01-17 NOTE — Patient Instructions (Signed)

## 2020-01-20 NOTE — Progress Notes (Signed)
GI Location of Tumor / Histology: Rectal Cancer  Jared White presented   Colonoscopy and endoscopy completed on August 13, 2019.  Rectal biopsy showed an invasive adenocarcinoma.  Colonoscopy 02/10/2020:  CT CAP 01/28/2020:  MRI Pelvis 08/26/2019:  3.8 cm left low rectal mass, as described above.  Rectal adenocarcinoma T stage: T3b  Rectal adenocarcinoma N stage:  N2  Distance from tumor to the internal anal sphincter is 2.0 cm.  CT CAP 08/22/2019: The patient's rectal cancer is very difficult to identify on the CT scan. But there is a worrisome 13 mm perirectal lymph node. No other enlarged pelvic or abdominal lymph nodes are identified.  No findings for metastatic disease involving the lungs, liver or bony structures.  Colonoscopy/endoscopy 08/13/2019: Obstructing large mass between 0 and 8 cm from the anal verge and is partially circumferential involving one third of the luminal circumference.  Biopsies: 08/13/2019   Past/Anticipated interventions by surgeon, if any:   Past/Anticipated interventions by medical oncology, if any:  Dr. Alen Blew 01/15/2020 -Neoadjuvant chemotherapy utilizing FOLFOX started on September 05 2019.  He is here for cycle 7 of therapy. -We will update his staging in the next few weeks and will arrange evaluation by radiation oncology to transition to the next phase of therapy utilizing radiation with chemotherapy.  It would be a reasonable candidate to switch to Xeloda orally with radiation concomitantly.   Weight changes, if any: Stable weight recently, lost about 25-30 pounds since this started.  Bowel/Bladder complaints, if any: Bowels have gotten better since starting chemo.  He was having constipation in the beginning.  He has had a few instances of diarrhea.  Nausea / Vomiting, if any: He has had one episode of vomiting, due to chemo.  Pain issues, if any: No   Any blood per rectum: No  Appetite: decreased, taste changes due to chemo.  Drinking fluids  pretty good.  SAFETY ISSUES:  Prior radiation? No  Pacemaker/ICD? No  Possible current pregnancy? n/a  Is the patient on methotrexate? No  Current Complaints/Details: -Has Port

## 2020-01-21 ENCOUNTER — Ambulatory Visit
Admission: RE | Admit: 2020-01-21 | Discharge: 2020-01-21 | Disposition: A | Discharge status: Home / Self Care | Payer: Medicare Other | Source: Ambulatory Visit | Attending: Radiation Oncology | Admitting: Radiation Oncology

## 2020-01-21 ENCOUNTER — Telehealth: Payer: Self-pay | Admitting: Oncology

## 2020-01-21 ENCOUNTER — Other Ambulatory Visit: Payer: Self-pay

## 2020-01-21 ENCOUNTER — Encounter: Payer: Self-pay | Admitting: Radiation Oncology

## 2020-01-21 VITALS — BP 132/69 | HR 62 | Temp 98.0°F | Resp 18 | Ht 68.0 in | Wt 156.0 lb

## 2020-01-21 DIAGNOSIS — Z7982 Long term (current) use of aspirin: Secondary | ICD-10-CM | POA: Insufficient documentation

## 2020-01-21 DIAGNOSIS — Z800 Family history of malignant neoplasm of digestive organs: Secondary | ICD-10-CM | POA: Diagnosis not present

## 2020-01-21 DIAGNOSIS — Z79899 Other long term (current) drug therapy: Secondary | ICD-10-CM | POA: Insufficient documentation

## 2020-01-21 DIAGNOSIS — I10 Essential (primary) hypertension: Secondary | ICD-10-CM | POA: Diagnosis not present

## 2020-01-21 DIAGNOSIS — K219 Gastro-esophageal reflux disease without esophagitis: Secondary | ICD-10-CM | POA: Diagnosis not present

## 2020-01-21 DIAGNOSIS — I2510 Atherosclerotic heart disease of native coronary artery without angina pectoris: Secondary | ICD-10-CM | POA: Diagnosis not present

## 2020-01-21 DIAGNOSIS — Z8041 Family history of malignant neoplasm of ovary: Secondary | ICD-10-CM | POA: Diagnosis not present

## 2020-01-21 DIAGNOSIS — I509 Heart failure, unspecified: Secondary | ICD-10-CM | POA: Diagnosis not present

## 2020-01-21 DIAGNOSIS — E785 Hyperlipidemia, unspecified: Secondary | ICD-10-CM | POA: Insufficient documentation

## 2020-01-21 DIAGNOSIS — C20 Malignant neoplasm of rectum: Secondary | ICD-10-CM | POA: Diagnosis not present

## 2020-01-21 NOTE — Progress Notes (Signed)
Radiation Oncology         [phone] ________________________________  Name: Jared White        MRN: [id number]  Date of Service: 01/21/2020 DOB: March 17, 1946  QB:HALPFX, [name], MD  Wyatt Portela, MD     REFERRING PHYSICIAN: Wyatt Portela, MD   DIAGNOSIS: The encounter diagnosis was Rectal cancer [facility]).   HISTORY OF PRESENT ILLNESS: Jared White is a 74 y.o. male seen at the request of Dr. Alen Blew for a recent history of rectal cancer.  He apparently had been going for evaluation due to abdominal pain, a CT of the abdomen and pelvis in March 2020 revealed bilateral tiny nonobstructing renal stones, a 9 mm perirectal/presacral lymph node, and a 3 mm lung nodule.  This was repeated on 10/29/2018 to follow-up with this lymph node, and this revealed a 10 x 15 mm soft tissue density in the left presacral space no other lymphadenopathy or other visible findings were otherwise noted.  He was evaluated by Dr. Alen Blew in July 2020, and recommendations were to repeat an MRI, this was performed on 12/28/2019 and revealed stability in the presacral lymph node.  No other acute findings were identified but diffuse diverticulosis was noted.  He followed up in the GI clinic but did not do so until March 2021 when he saw Dr. Rush Landmark, and underwent colonoscopy and EGD.  Colonoscopy revealed a palpable rectal mass on digital rectal exam, some polyps in the ascending colon and diverticulosis.  His biopsy on 08/13/2019 was consistent with an adenocarcinoma.  He subsequently underwent staging imaging on 08/22/2019, the 13 mm perirectal node was again seen, no other evidence of metastatic disease was identified, and an MRI with rectal cancer protocol measured the tumor is a T3BN2,  2 cm from the sphincter.  Given these findings the patient was counseled on total neoadjuvant chemotherapy which she began on 09/05/2019, he has completed 7 cycles of this with his last treatment administered on 01/15/2020.  The patient and Dr.  Alen Blew have decided to forego further treatment due to his intolerance.  He is seen today to discuss the options of chemoradiation. He is going to undergo EGD and colonoscopy with Dr. Rush Landmark on 02/10/20 for follow up of a possible polyp in the esophagus and to reassess his rectal tumor.   PREVIOUS RADIATION THERAPY: No   PAST MEDICAL HISTORY:  Past Medical History:  Diagnosis Date  . Broken back   . CAD (coronary artery disease)    PCI & CABG  . CHF (congestive heart failure) ([place])   . Diverticulitis    mild - approx 2004  . Family history of heart disease   . GERD (gastroesophageal reflux disease)   . History of nuclear stress test 06/30/2011   lexiscan; normal pattern of perfusion post-stress; low risk scan   . Hyperlipidemia   . Hypertension   . IgA nephropathy   . Irregular heart beat   . Rectal cancer ([place]) 08/19/2019  . Systemic hypertension        PAST SURGICAL HISTORY: Past Surgical History:  Procedure Laterality Date  . CARDIAC CATHETERIZATION  08/1995   PTCA of OM (Dr. Marella Chimes)  . CORONARY ARTERY BYPASS GRAFT  01/2000   x5 - LIMA to LAD, SVG to diagonal, sequential SVG to ramus & OM, SVG to PDA (Dr. Tharon Aquas Trigt)  . IR IMAGING GUIDED PORT INSERTION  09/04/2019  . LUNG LOBECTOMY     left upper lobe  . TRANSTHORACIC  ECHOCARDIOGRAM  12/24/2012   EF 55-60%, mild LVH, mild conc hypertrophy; mild MR; LA mildly dilated     FAMILY HISTORY:  Family History  Problem Relation Age of Onset  . Heart attack Father   . Kidney failure Brother   . Prostate cancer Brother   . Heart attack Brother   . Heart disease Brother   . Hypertension Brother   . Heart disease Brother   . Hypertension Brother   . Colon cancer Neg Hx   . Esophageal cancer Neg Hx   . Rectal cancer Neg Hx   . Stomach cancer Neg Hx   . Inflammatory bowel disease Neg Hx   . Liver disease Neg Hx   . Pancreatic cancer Neg Hx      SOCIAL HISTORY:  reports that he has never smoked. He has never  used smokeless tobacco. He reports previous alcohol use. He reports that he does not use drugs. The patient is single and lives in [place]. He's a retired Research officer, [employer]. He enjoys riding horses and playing golf.   ALLERGIES: Patient has no known allergies.   MEDICATIONS:  Current Outpatient Medications  Medication Sig Dispense Refill  . aspirin EC 81 MG tablet Take 1 tablet (81 mg total) by mouth daily. 90 tablet 3  . atenolol (TENORMIN) 50 MG tablet TAKE 1 TABLET BY MOUTH EVERY DAY 90 tablet 2  . calcium carbonate (OSCAL) 1500 (600 Ca) MG TABS tablet Take by mouth daily.    . clotrimazole-betamethasone (LOTRISONE) cream Apply 1 application topically 2 (two) times daily. 30 g 0  . famotidine (PEPCID) 20 MG tablet Take 1 tablet (20 mg total) by mouth 2 (two) times daily. 60 tablet 5  . fluticasone (FLONASE) 50 MCG/ACT nasal spray INSTILL 2 SPRAYS INTO EACH NOSTRIL EVERY DAY 48 g 2  . lidocaine-prilocaine (EMLA) cream Apply 1 application topically as needed. 30 g 0  . Magnesium Oxide 400 MG CAPS Take 1 capsule (400 mg total) by mouth 2 (two) times daily. 60 capsule 0  . nitroGLYCERIN (NITROSTAT) 0.4 MG SL tablet SMARTSIG:2 Tablet(s) Sublingual PRN    . Omega-3 Fatty Acids (OMEGA 3 PO) Take 520 mg by mouth daily.     Marland Kitchen omeprazole (PRILOSEC) 40 MG capsule Take 1 capsule (40 mg total) by mouth 2 (two) times daily. 90 capsule 3  . prochlorperazine (COMPAZINE) 10 MG tablet Take 1 tablet (10 mg total) by mouth every 6 (six) hours as needed for nausea or vomiting. 30 tablet 0  . rosuvastatin (CRESTOR) 20 MG tablet TAKE 1 TABLET BY MOUTH EVERY DAY 90 tablet 2  . valsartan (DIOVAN) 160 MG tablet TAKE 1 TABLET BY MOUTH ONE TIME DAILY. 90 tablet 2  . vitamin B-12 (CYANOCOBALAMIN) 1000 MCG tablet Take 1,000 mcg by mouth daily.     No current facility-administered medications for this encounter.     REVIEW OF SYSTEMS: On review of systems, the patient reports that he is doing well overall. He denies  any chest pain, shortness of breath, cough, fevers, chills, night sweats, unintended weight changes. He is no longer seeing bleeding in his stools. He denies any bowel or bladder disturbances, and denies abdominal pain, nausea or vomiting. Though reports he did have one episode of nausea after his last chemo treatment that responded to antiemetics. He denies any new musculoskeletal or joint aches or pains. A complete review of systems is obtained and is otherwise negative.     PHYSICAL EXAM:  Wt Readings from Last 3 Encounters:  01/21/20 156 lb (70.8 kg)  01/15/20 158 lb 14.4 oz (72.1 kg)  01/01/20 157 lb (71.2 kg)   Temp Readings from Last 3 Encounters:  01/21/20 98 F (36.7 C)  01/17/20 98.8 F (37.1 C) (Oral)  01/15/20 98.4 F (36.9 C) (Oral)   BP Readings from Last 3 Encounters:  01/21/20 132/69  01/17/20 129/61  01/15/20 (!) 168/79   Pulse Readings from Last 3 Encounters:  01/21/20 62  01/17/20 62  01/15/20 60    In general this is a well appearing caucasian male in no acute distress. He's alert and oriented x4 and appropriate throughout the examination. Cardiopulmonary assessment is negative for acute distress and he exhibits normal effort.     ECOG = 0  0 - Asymptomatic (Fully active, able to carry on all predisease activities without restriction)  1 - Symptomatic but completely ambulatory (Restricted in physically strenuous activity but ambulatory and able to carry out work of a light or sedentary nature. For example, light housework, office work)  2 - Symptomatic, <50% in bed during the day (Ambulatory and capable of all self care but unable to carry out any work activities. Up and about more than 50% of waking hours)  3 - Symptomatic, >50% in bed, but not bedbound (Capable of only limited self-care, confined to bed or chair 50% or more of waking hours)  4 - Bedbound (Completely disabled. Cannot carry on any self-care. Totally confined to bed or chair)  5 -  Death   Eustace Pen MM, Creech RH, Tormey DC, et al. [phone]). "Toxicity and response criteria of the [facility]". [place] Oncol. 5 (6): 649-55    LABORATORY DATA:  Lab Results  Component Value Date   WBC 5.0 01/15/2020   HGB 11.5 (L) 01/15/2020   HCT 35.1 (L) 01/15/2020   MCV 102.3 (H) 01/15/2020   PLT 95 (L) 01/15/2020   Lab Results  Component Value Date   NA 142 01/15/2020   K 3.6 01/15/2020   CL 110 01/15/2020   CO2 23 01/15/2020   Lab Results  Component Value Date   ALT 17 01/15/2020   AST 25 01/15/2020   ALKPHOS 87 01/15/2020   BILITOT 0.3 01/15/2020      RADIOGRAPHY: No results found.     IMPRESSION/PLAN: 1. Stage [id number], adenocarcinoma of the rectum. Dr. Lisbeth Renshaw reviewed the patient's pathology and the course thus far.  He reviews the nature of rectal cancer, a and recommends a course of chemoradiation.  The patient is not quite sure that he is interested in having surgical resection but we explained that he should still consider meeting with colorectal surgery.  He is also planning to follow-up with Dr. Rush Landmark and I believe this is to determine if he might be a candidate for endoscopic resection at some point.  He also has follow-up to evaluate for possible polyp in the esophagus.  We discussed the risks, benefits, short and long-term effects of radiotherapy, as well as the delivery and logistics.  The patient is interested in proceeding.  Written consent is obtained and placed in the chart, a copy was provided to the patient.  We will proceed with simulation on September 14 and anticipating starting treatment following his colonoscopy on the 20th, so our start date is tentatively 02/17/2020.  In a visit lasting 60 minutes, greater than 50% of the time was spent face to face discussing the patient's condition, in preparation for the discussion, and coordinating the patient's care.  The above documentation reflects my direct findings during this  shared patient visit. Please see the separate note by Dr. Lisbeth Renshaw on this date for the remainder of the patient's plan of care.    Carola Rhine, PAC

## 2020-01-21 NOTE — Telephone Encounter (Signed)
Called patient regarding rescheduled appointment, patient is notified. ?

## 2020-01-22 ENCOUNTER — Encounter: Payer: Self-pay | Admitting: General Practice

## 2020-01-22 NOTE — Progress Notes (Signed)
[place] Psychosocial Distress Screening Clinical Social Work  Clinical Social Work was referred by distress screening protocol.  The patient scored a 5 on the Psychosocial Distress Thermometer which indicates moderate distress. Clinical Social Worker contacted patient by phone to assess for distress and other psychosocial needs.  Has difficulty w food due to chemotherapy side effects - food "tastes bad."  Does not enjoy nutritional supplements.  Has good social connections, lives alone but has community support/friends.  He states "it is tough, but I am going to make it."  CSW and patient discussed common feeling and emotions when being diagnosed with cancer, and the importance of support during treatment. CSW informed patient of the support team and support services at [facility]. CSW provided contact information and encouraged patient to call with any questions or concerns.  ONCBCN DISTRESS SCREENING 01/21/2020  Screening Type Initial Screening  Distress experienced in past week (1-10) 5  Practical problem type Food  Emotional problem type Adjusting to appearance changes  Physical Problem type Loss of appetitie  Other Contact via phone    Clinical Social Worker follow up needed: No.  If yes, follow up plan:  Beverely Pace, [place], [place] Social Worker Phone:  [phone] Cell:  [phone]

## 2020-01-23 ENCOUNTER — Other Ambulatory Visit: Payer: Self-pay | Admitting: Cardiology

## 2020-01-28 ENCOUNTER — Telehealth: Payer: Self-pay | Admitting: *Deleted

## 2020-01-28 ENCOUNTER — Inpatient Hospital Stay: Payer: Medicare Other

## 2020-01-28 ENCOUNTER — Ambulatory Visit (HOSPITAL_COMMUNITY)
Admission: RE | Admit: 2020-01-28 | Discharge: 2020-01-28 | Disposition: A | Discharge status: Home / Self Care | Payer: Medicare Other | Source: Ambulatory Visit | Attending: Oncology | Admitting: Oncology

## 2020-01-28 ENCOUNTER — Other Ambulatory Visit (HOSPITAL_COMMUNITY): Payer: Medicare Other

## 2020-01-28 ENCOUNTER — Other Ambulatory Visit: Payer: Self-pay

## 2020-01-28 ENCOUNTER — Other Ambulatory Visit: Payer: Self-pay | Admitting: Oncology

## 2020-01-28 ENCOUNTER — Inpatient Hospital Stay: Payer: Medicare Other | Attending: Oncology

## 2020-01-28 ENCOUNTER — Encounter (HOSPITAL_COMMUNITY): Payer: Self-pay

## 2020-01-28 DIAGNOSIS — R197 Diarrhea, unspecified: Secondary | ICD-10-CM | POA: Insufficient documentation

## 2020-01-28 DIAGNOSIS — Z9221 Personal history of antineoplastic chemotherapy: Secondary | ICD-10-CM | POA: Diagnosis not present

## 2020-01-28 DIAGNOSIS — D6959 Other secondary thrombocytopenia: Secondary | ICD-10-CM | POA: Diagnosis not present

## 2020-01-28 DIAGNOSIS — Z79899 Other long term (current) drug therapy: Secondary | ICD-10-CM | POA: Diagnosis not present

## 2020-01-28 DIAGNOSIS — K449 Diaphragmatic hernia without obstruction or gangrene: Secondary | ICD-10-CM | POA: Diagnosis not present

## 2020-01-28 DIAGNOSIS — D709 Neutropenia, unspecified: Secondary | ICD-10-CM | POA: Insufficient documentation

## 2020-01-28 DIAGNOSIS — R599 Enlarged lymph nodes, unspecified: Secondary | ICD-10-CM | POA: Insufficient documentation

## 2020-01-28 DIAGNOSIS — K8020 Calculus of gallbladder without cholecystitis without obstruction: Secondary | ICD-10-CM | POA: Diagnosis not present

## 2020-01-28 DIAGNOSIS — I700 Atherosclerosis of aorta: Secondary | ICD-10-CM | POA: Diagnosis not present

## 2020-01-28 DIAGNOSIS — I739 Peripheral vascular disease, unspecified: Secondary | ICD-10-CM | POA: Diagnosis not present

## 2020-01-28 DIAGNOSIS — T451X5A Adverse effect of antineoplastic and immunosuppressive drugs, initial encounter: Secondary | ICD-10-CM | POA: Insufficient documentation

## 2020-01-28 DIAGNOSIS — C20 Malignant neoplasm of rectum: Secondary | ICD-10-CM | POA: Insufficient documentation

## 2020-01-28 DIAGNOSIS — G620 Drug-induced polyneuropathy: Secondary | ICD-10-CM | POA: Diagnosis not present

## 2020-01-28 DIAGNOSIS — Z95828 Presence of other vascular implants and grafts: Secondary | ICD-10-CM

## 2020-01-28 DIAGNOSIS — I2510 Atherosclerotic heart disease of native coronary artery without angina pectoris: Secondary | ICD-10-CM | POA: Diagnosis not present

## 2020-01-28 LAB — CMP (CANCER CENTER ONLY)
ALT: 25 U/L (ref 0–44)
AST: 27 U/L (ref 15–41)
Albumin: 3.3 g/dL — ABNORMAL LOW (ref 3.5–5.0)
Alkaline Phosphatase: 109 U/L (ref 38–126)
Anion gap: 6 (ref 5–15)
BUN: 13 mg/dL (ref 8–23)
CO2: 24 mmol/L (ref 22–32)
Calcium: 8.9 mg/dL (ref 8.9–10.3)
Chloride: 111 mmol/L (ref 98–111)
Creatinine: 1.34 mg/dL — ABNORMAL HIGH (ref 0.61–1.24)
GFR, Est AFR Am: >60 mL/min (ref >60)
GFR, Estimated: 52 mL/min — ABNORMAL LOW (ref >60)
Glucose, Bld: 158 mg/dL — ABNORMAL HIGH (ref 70–99)
Potassium: 3.1 mmol/L — ABNORMAL LOW (ref 3.5–5.1)
Sodium: 141 mmol/L (ref 135–145)
Total Bilirubin: 0.3 mg/dL (ref 0.3–1.2)
Total Protein: 6.7 g/dL (ref 6.5–8.1)

## 2020-01-28 LAB — CBC WITH DIFFERENTIAL (CANCER CENTER ONLY)
Abs Immature Granulocytes: 0.22 10*3/uL — ABNORMAL HIGH (ref 0.00–0.07)
Basophils Absolute: 0.1 10*3/uL (ref 0.0–0.1)
Basophils Relative: 1 %
Eosinophils Absolute: 0.1 10*3/uL (ref 0.0–0.5)
Eosinophils Relative: 1 %
HCT: 34 % — ABNORMAL LOW (ref 39.0–52.0)
Hemoglobin: 11.3 g/dL — ABNORMAL LOW (ref 13.0–17.0)
Immature Granulocytes: 2 %
Lymphocytes Relative: 17 %
Lymphs Abs: 1.5 10*3/uL (ref 0.7–4.0)
MCH: 33.4 pg (ref 26.0–34.0)
MCHC: 33.2 g/dL (ref 30.0–36.0)
MCV: 100.6 fL — ABNORMAL HIGH (ref 80.0–100.0)
Monocytes Absolute: 0.8 10*3/uL (ref 0.1–1.0)
Monocytes Relative: 9 %
Neutro Abs: 6.4 10*3/uL (ref 1.7–7.7)
Neutrophils Relative %: 70 %
Platelet Count: 90 10*3/uL — ABNORMAL LOW (ref 150–400)
RBC: 3.38 MIL/uL — ABNORMAL LOW (ref 4.22–5.81)
RDW: 14.4 % (ref 11.5–15.5)
WBC Count: 9.1 10*3/uL (ref 4.0–10.5)
nRBC: 0 % (ref 0.0–0.2)

## 2020-01-28 LAB — CEA (IN HOUSE-CHCC): CEA (CHCC-In House): 2.34 ng/mL (ref 0.00–5.00)

## 2020-01-28 IMAGING — CT CT ABD-PELV W/O CM
2 of 4 series · 14 of 36 positions shown, 17 images · non-contrast
Comparison: CT scan [DATE] and MRI pelvis [DATE]

CLINICAL DATA: Rectal carcinoma. Status post chemotherapy
[DATE]

EXAM:
CT CHEST, ABDOMEN AND PELVIS WITHOUT CONTRAST
TECHNIQUE: Multidetector CT imaging of the chest, abdomen and pelvis was
performed following the standard protocol without IV contrast.

[Series 2: cap w/o · axial · non-contrast · 0.79mm/px · z∈[-618,-68]mm · 11 of 132 slices shown, 14 images]
[im 11/132  mediastinal]
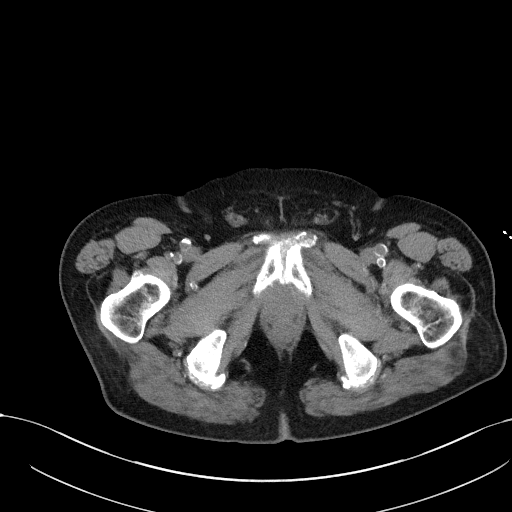
[im 11/132  lung]
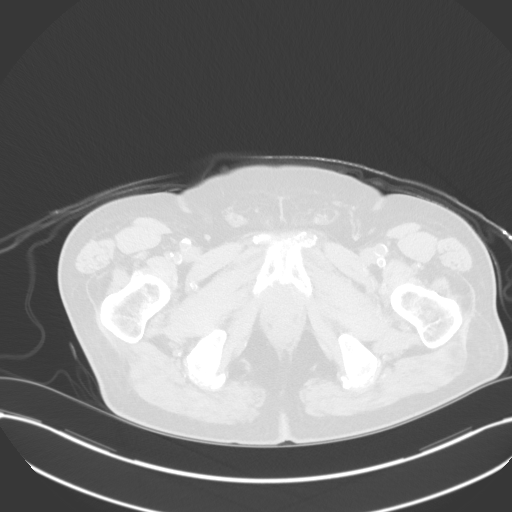
[im 22/132  lung]
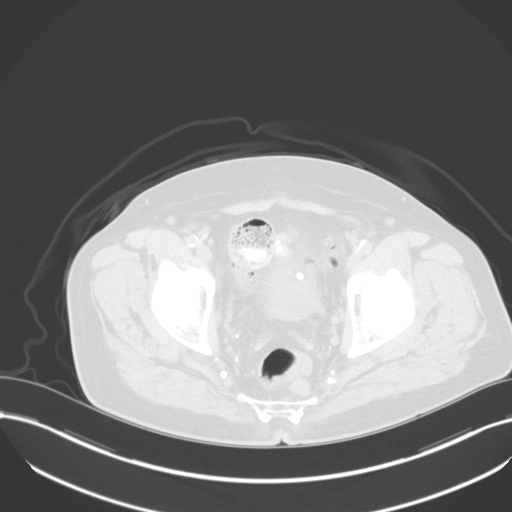
[im 33/132  lung]
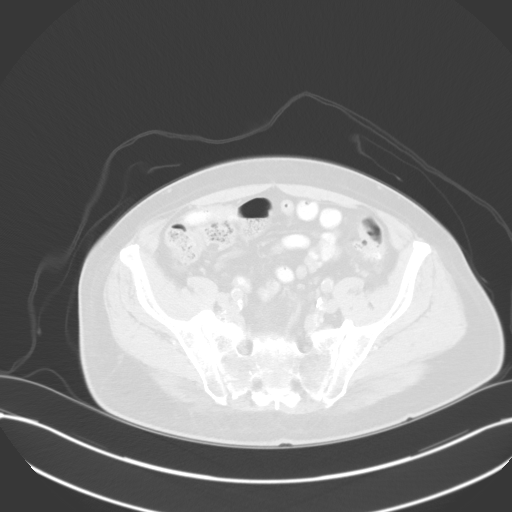
[im 44/132  lung]
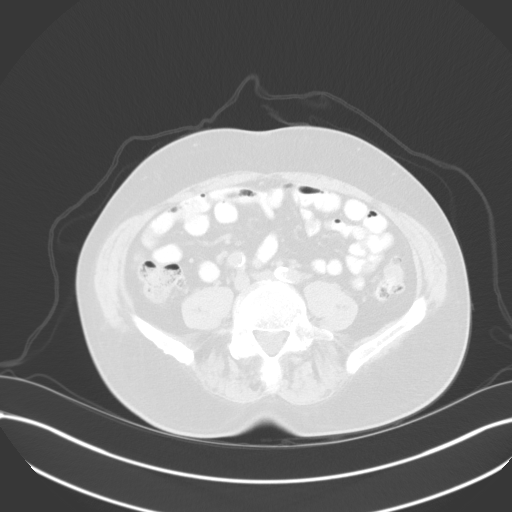
[im 55/132  mediastinal]
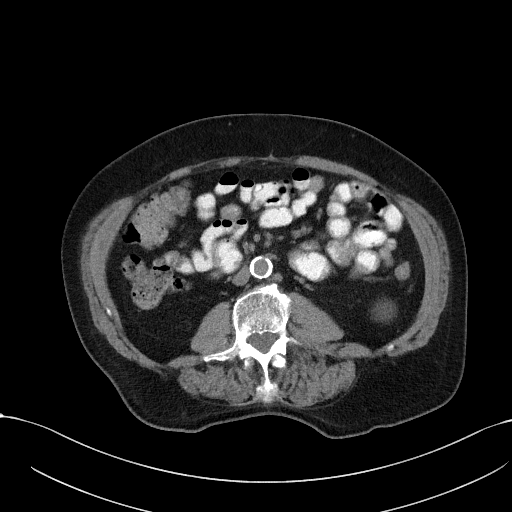
[im 55/132  lung]
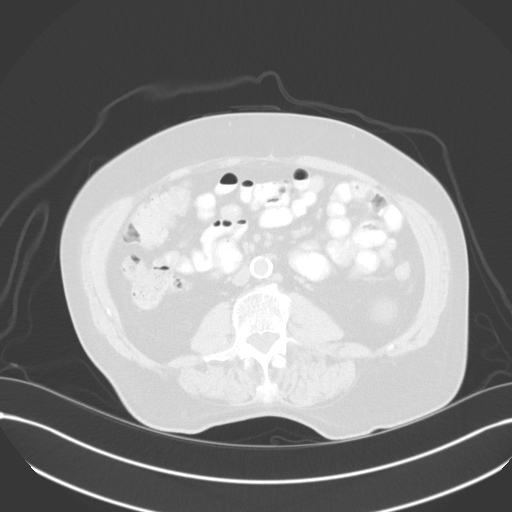
[im 66/132  lung]
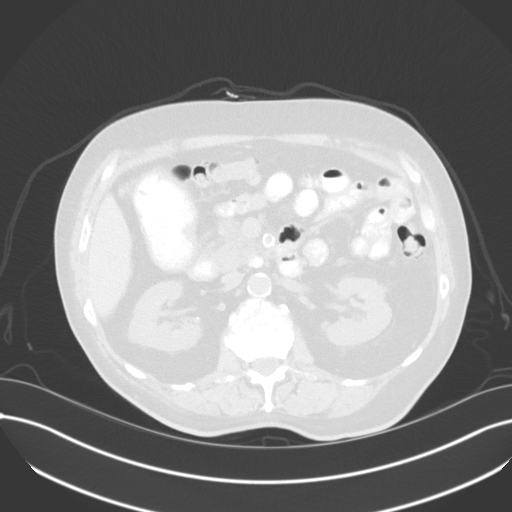
[im 77/132  lung]
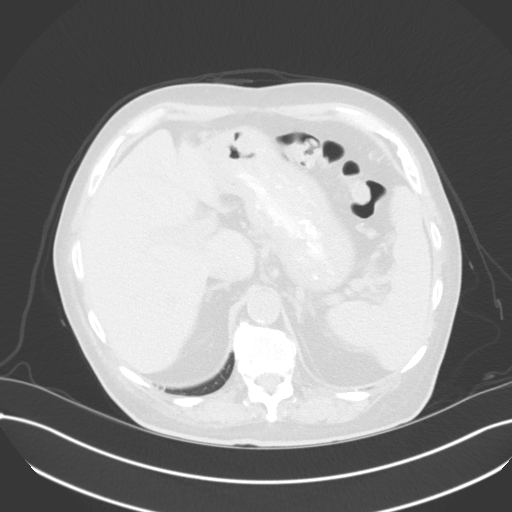
[im 88/132  lung]
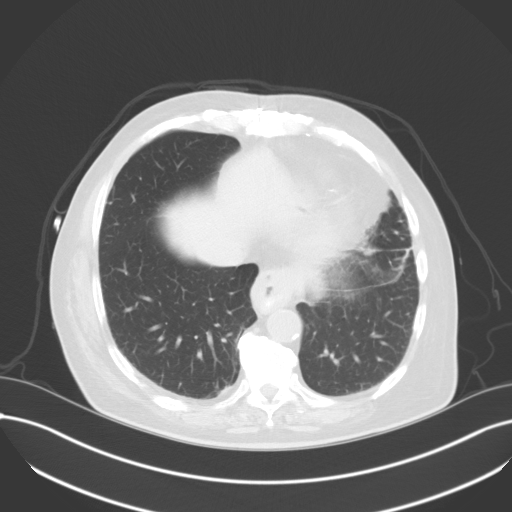
[im 99/132  mediastinal]
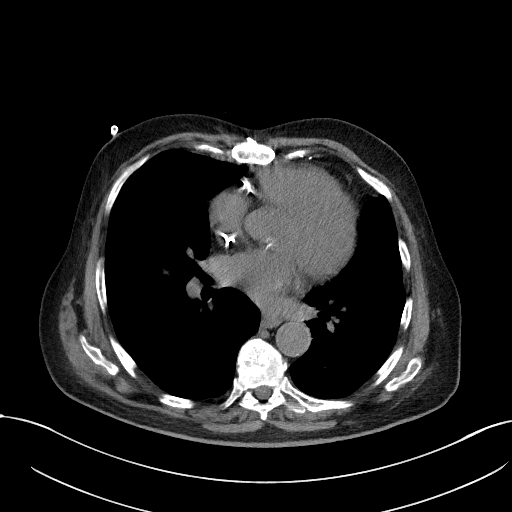
[im 99/132  lung]
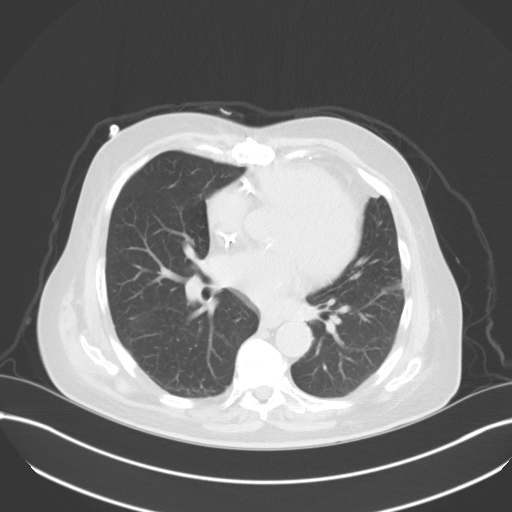
[im 110/132  lung]
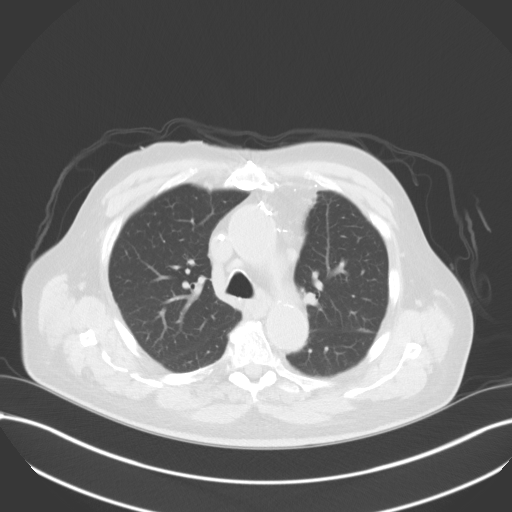
[im 121/132  lung]
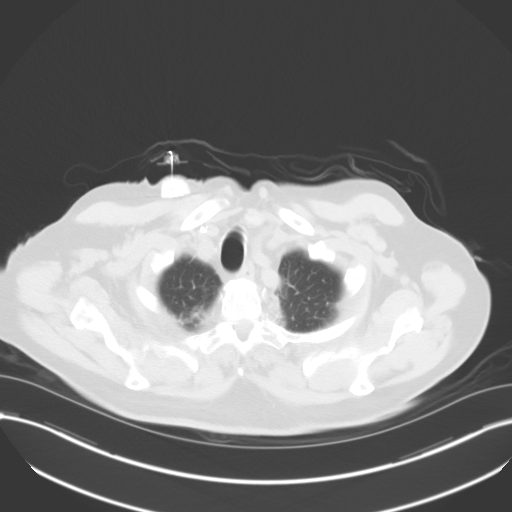

[Series 4: coronals · coronal · 1.15mm/px · 3 of 154 slices shown]
[im 31/154  lung]
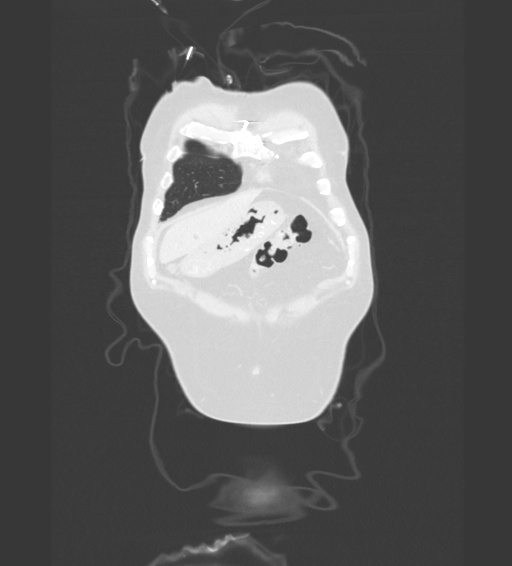
[im 62/154  lung]
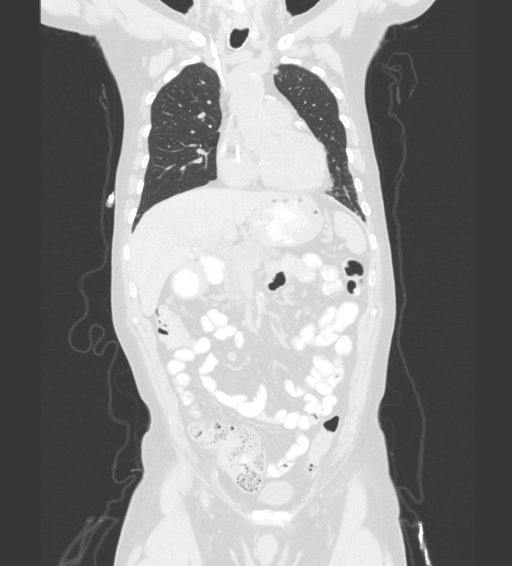
[im 92/154  lung]
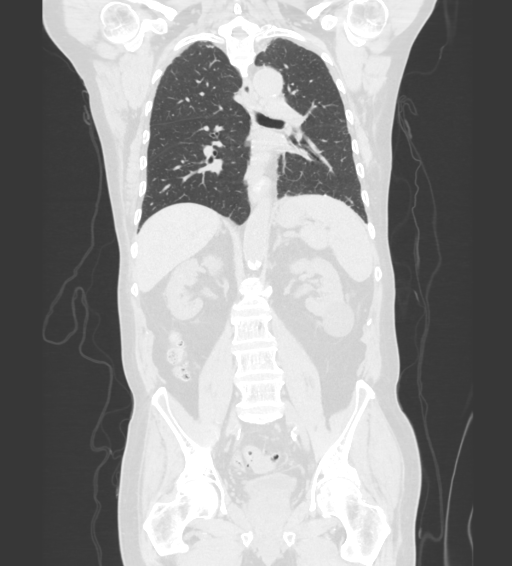

[14 of 36 positions shown; findings below may reference images not displayed]

FINDINGS: CT CHEST FINDINGS

Cardiovascular: The heart is normal in size. No pericardial
effusion. Stable aortic calcifications and surgical changes from
coronary artery bypass surgery. Extensive coronary artery
calcifications are noted.

Right IJ Port-A-Cath in good position without complicating features.

Mediastinum/Nodes: No mediastinal or hilar mass or adenopathy.
Stable scattered sub 8 mm mediastinal nodes. The esophagus is
grossly normal. Stable hiatal hernia.

Lungs/Pleura: No worrisome pulmonary nodules to suggest metastatic
disease. Stable interstitial scarring type changes involving the
lower left lung. No acute infiltrates, pleural effusions or pleural
nodules.

Musculoskeletal: No significant bony findings. Stable degenerative
changes involving the spine.

CT ABDOMEN PELVIS FINDINGS

Hepatobiliary: No obvious hepatic lesions are identified without
contrast. Numerous calcified gallstones are noted in the
gallbladder. No common bile duct dilatation.

Pancreas: No mass, inflammation or ductal dilatation. Mild
pancreatic atrophy.

Spleen: Within normal limits in size.  No focal lesions.

Adrenals/Urinary Tract: The adrenal glands are normal. Small renal
calculi and lower pole left renal cysts are stable. No bladder
lesions

Stomach/Bowel: The stomach, duodenum, small bowel and colon are
unremarkable. No acute inflammatory process, mass lesions or
obstructive findings. Extensive colonic diverticulosis is again
noted.

The low rectal neoplasm is difficult to evaluate. No obvious mass.
There is a stable to slightly larger 14 mm mesorectal lymph node
noted on the left side on image 113/2. No pelvic sidewall
adenopathy. A few small scattered bilateral obturator lymph nodes
are unchanged. No adenopathy along the sigmoid mesocolon or in the
retroperitoneum. Few small scattered retroperitoneal lymph nodes are
stable.

Vascular/Lymphatic: Stable advanced vascular disease. Small
scattered mesenteric and retroperitoneal lymph nodes are stable. No
new or progressive findings.

14 mm mesorectal lymph node on the left side is stable to slightly
larger.

Reproductive: Stable mild prostate gland enlargement. The seminal
vesicles are unremarkable.

Other: No inguinal mass or inguinal adenopathy.

Musculoskeletal: No significant bony findings. Stable degenerative
changes involving the spine and stable L2 compression fracture.
IMPRESSION: 1. Stable to slightly larger mesorectal lymph node. No new or
progressive pelvic adenopathy otherwise.
2. No findings for metastatic disease involving the chest or
abdomen.
3. Stable advanced vascular disease.
4. Cholelithiasis.
5. Stable hiatal hernia.

Aortic Atherosclerosis ([B9]-[B9]).

## 2020-01-28 IMAGING — CT CT CHEST W/O CM
2 of 4 series · 14 of 36 positions shown, 17 images · non-contrast
Comparison: CT scan [DATE] and MRI pelvis [DATE]

CLINICAL DATA: Rectal carcinoma. Status post chemotherapy
[DATE]

EXAM:
CT CHEST, ABDOMEN AND PELVIS WITHOUT CONTRAST
TECHNIQUE: Multidetector CT imaging of the chest, abdomen and pelvis was
performed following the standard protocol without IV contrast.

[Series 2: cap w/o · axial · non-contrast · 0.79mm/px · z∈[-618,-68]mm · 11 of 132 slices shown, 14 images]
[im 11/132  mediastinal]
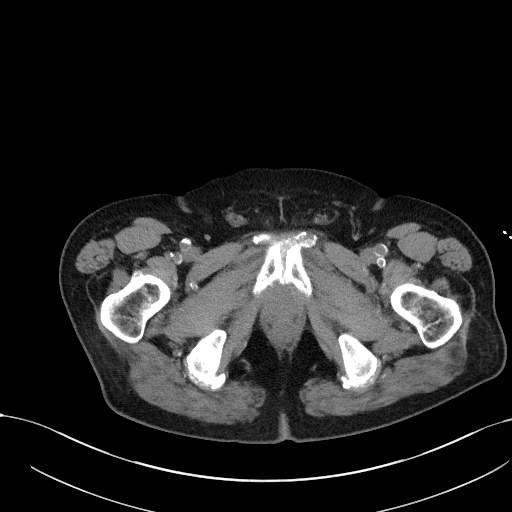
[im 11/132  lung]
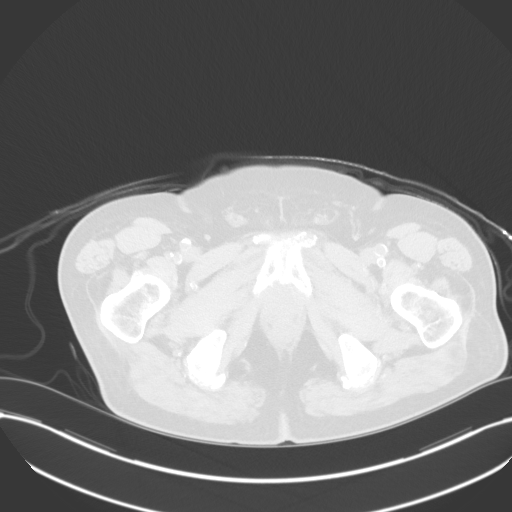
[im 22/132  lung]
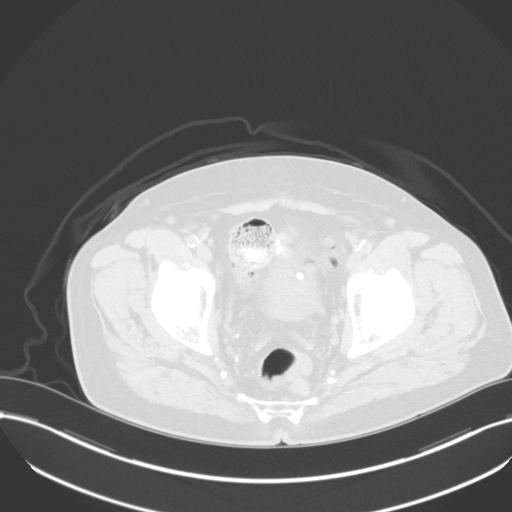
[im 33/132  lung]
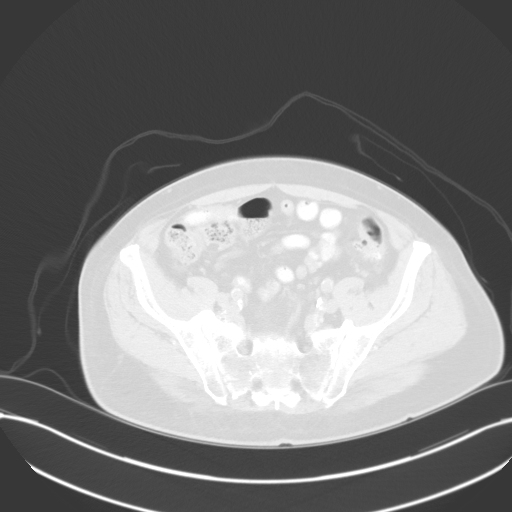
[im 44/132  lung]
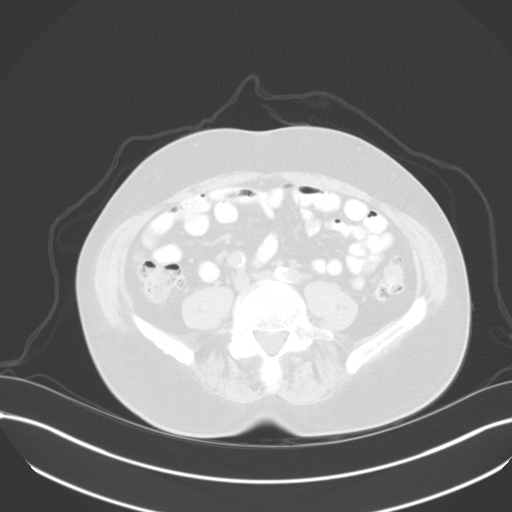
[im 55/132  mediastinal]
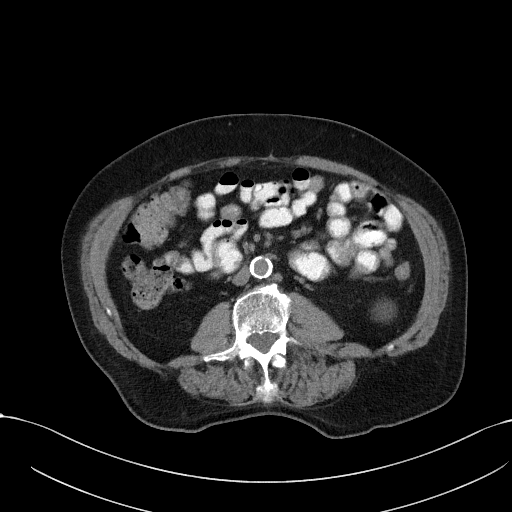
[im 55/132  lung]
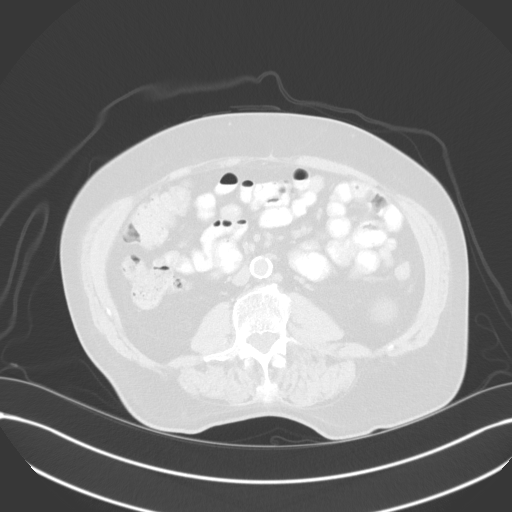
[im 66/132  lung]
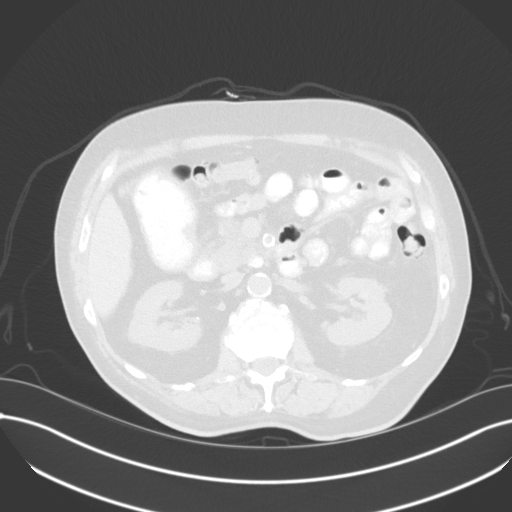
[im 77/132  lung]
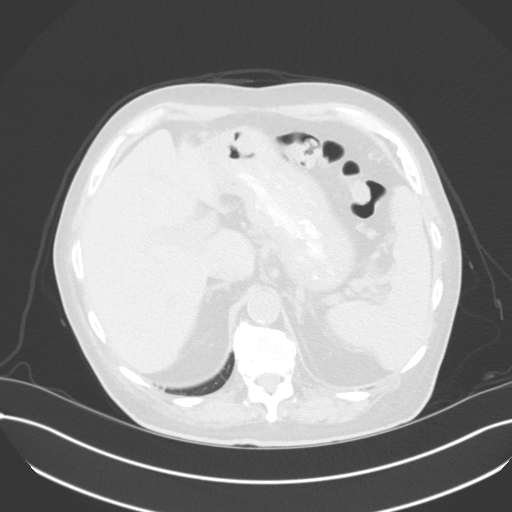
[im 88/132  lung]
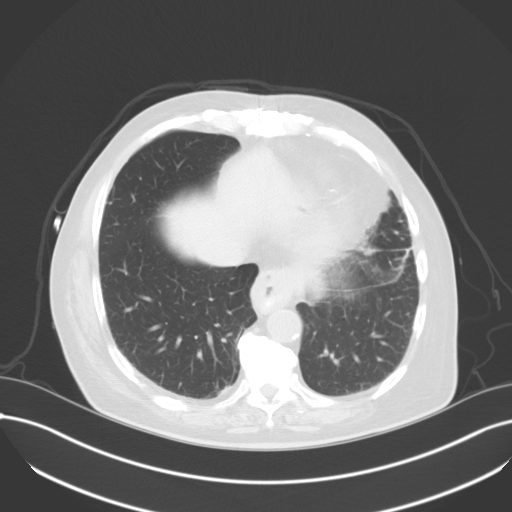
[im 99/132  mediastinal]
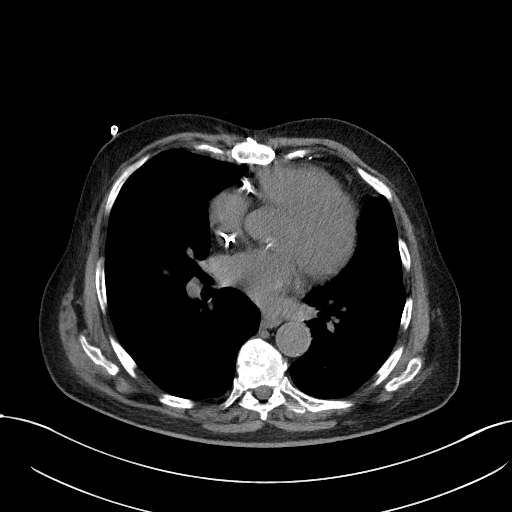
[im 99/132  lung]
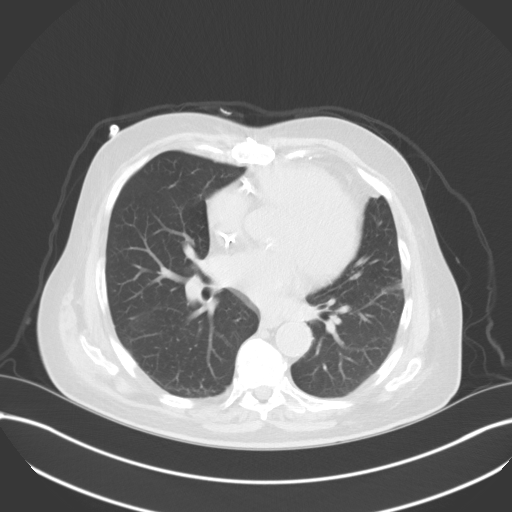
[im 110/132  lung]
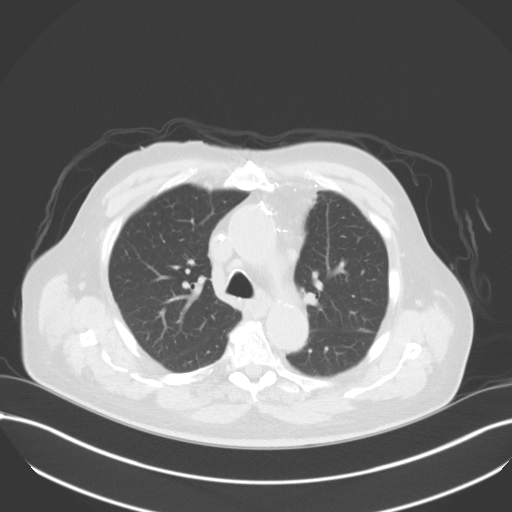
[im 121/132  lung]
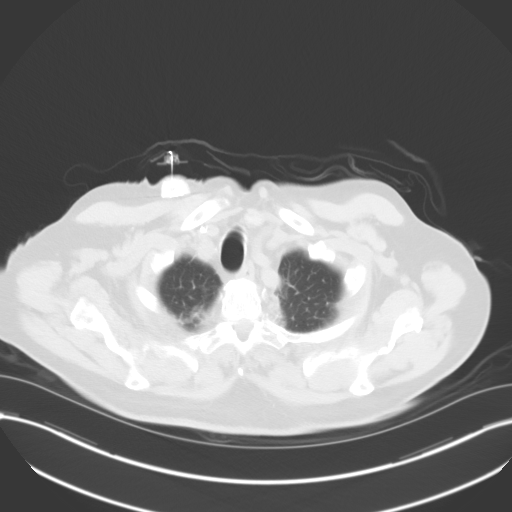

[Series 4: coronals · coronal · 1.15mm/px · 3 of 154 slices shown]
[im 31/154  lung]
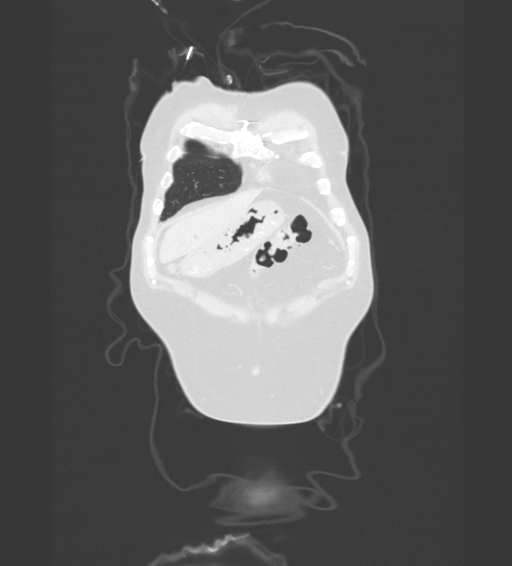
[im 62/154  lung]
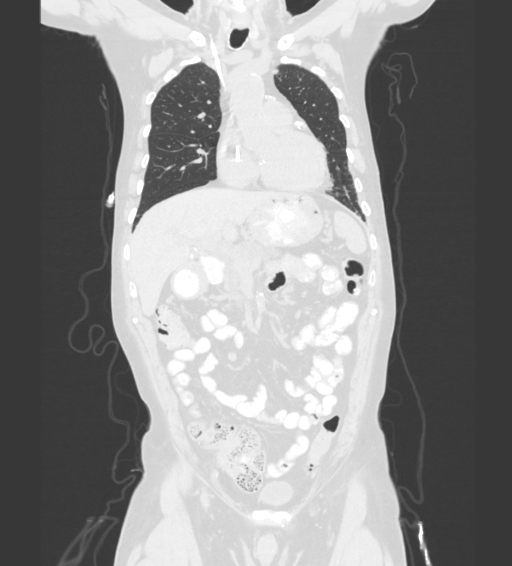
[im 92/154  lung]
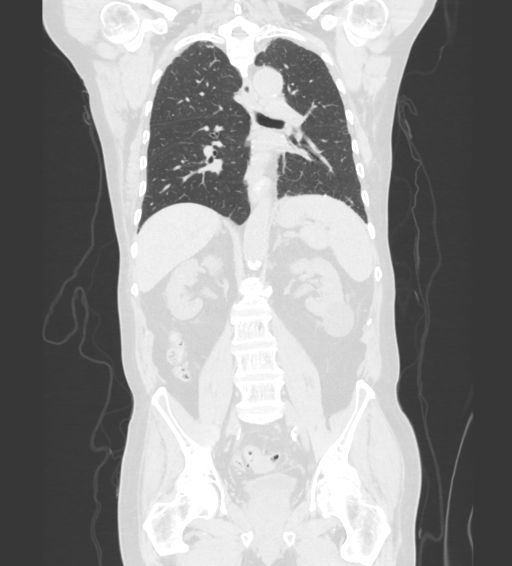

[14 of 36 positions shown; findings below may reference images not displayed]

FINDINGS: CT CHEST FINDINGS

Cardiovascular: The heart is normal in size. No pericardial
effusion. Stable aortic calcifications and surgical changes from
coronary artery bypass surgery. Extensive coronary artery
calcifications are noted.

Right IJ Port-A-Cath in good position without complicating features.

Mediastinum/Nodes: No mediastinal or hilar mass or adenopathy.
Stable scattered sub 8 mm mediastinal nodes. The esophagus is
grossly normal. Stable hiatal hernia.

Lungs/Pleura: No worrisome pulmonary nodules to suggest metastatic
disease. Stable interstitial scarring type changes involving the
lower left lung. No acute infiltrates, pleural effusions or pleural
nodules.

Musculoskeletal: No significant bony findings. Stable degenerative
changes involving the spine.

CT ABDOMEN PELVIS FINDINGS

Hepatobiliary: No obvious hepatic lesions are identified without
contrast. Numerous calcified gallstones are noted in the
gallbladder. No common bile duct dilatation.

Pancreas: No mass, inflammation or ductal dilatation. Mild
pancreatic atrophy.

Spleen: Within normal limits in size.  No focal lesions.

Adrenals/Urinary Tract: The adrenal glands are normal. Small renal
calculi and lower pole left renal cysts are stable. No bladder
lesions

Stomach/Bowel: The stomach, duodenum, small bowel and colon are
unremarkable. No acute inflammatory process, mass lesions or
obstructive findings. Extensive colonic diverticulosis is again
noted.

The low rectal neoplasm is difficult to evaluate. No obvious mass.
There is a stable to slightly larger 14 mm mesorectal lymph node
noted on the left side on image 113/2. No pelvic sidewall
adenopathy. A few small scattered bilateral obturator lymph nodes
are unchanged. No adenopathy along the sigmoid mesocolon or in the
retroperitoneum. Few small scattered retroperitoneal lymph nodes are
stable.

Vascular/Lymphatic: Stable advanced vascular disease. Small
scattered mesenteric and retroperitoneal lymph nodes are stable. No
new or progressive findings.

14 mm mesorectal lymph node on the left side is stable to slightly
larger.

Reproductive: Stable mild prostate gland enlargement. The seminal
vesicles are unremarkable.

Other: No inguinal mass or inguinal adenopathy.

Musculoskeletal: No significant bony findings. Stable degenerative
changes involving the spine and stable L2 compression fracture.
IMPRESSION: 1. Stable to slightly larger mesorectal lymph node. No new or
progressive pelvic adenopathy otherwise.
2. No findings for metastatic disease involving the chest or
abdomen.
3. Stable advanced vascular disease.
4. Cholelithiasis.
5. Stable hiatal hernia.

Aortic Atherosclerosis ([B9]-[B9]).

## 2020-01-28 MED ORDER — SODIUM CHLORIDE 0.9% FLUSH
10.0000 mL | Freq: Once | INTRAVENOUS | Status: AC
  Administered 2020-01-28: 10 mL
  Filled 2020-01-28: qty 10

## 2020-01-28 MED ORDER — HEPARIN SOD (PORK) LOCK FLUSH 100 UNIT/ML IV SOLN
INTRAVENOUS | Status: AC
  Filled 2020-01-28: qty 5

## 2020-01-28 MED ORDER — HEPARIN SOD (PORK) LOCK FLUSH 100 UNIT/ML IV SOLN
500.0000 [IU] | Freq: Once | INTRAVENOUS | Status: AC
  Administered 2020-01-28: 500 [IU] via INTRAVENOUS

## 2020-01-28 NOTE — Telephone Encounter (Signed)
RECEIVED PHONE [name] ([place]) THIS PATIENT WILL HAVE A CONSULT WITH DR. Marcello Moores ON 01-30-20 @ 9:40 AM, PATIENT IS AWARE OF THIS APPT. PER [place] @ [place]

## 2020-01-29 ENCOUNTER — Other Ambulatory Visit: Payer: Self-pay

## 2020-01-30 DIAGNOSIS — C20 Malignant neoplasm of rectum: Secondary | ICD-10-CM | POA: Diagnosis not present

## 2020-01-31 ENCOUNTER — Inpatient Hospital Stay (HOSPITAL_BASED_OUTPATIENT_CLINIC_OR_DEPARTMENT_OTHER): Payer: Medicare Other | Admitting: Oncology

## 2020-01-31 ENCOUNTER — Telehealth: Payer: Self-pay

## 2020-01-31 ENCOUNTER — Telehealth: Payer: Self-pay | Admitting: Pharmacist

## 2020-01-31 ENCOUNTER — Other Ambulatory Visit: Payer: Self-pay | Admitting: Oncology

## 2020-01-31 ENCOUNTER — Other Ambulatory Visit: Payer: Self-pay

## 2020-01-31 VITALS — BP 175/88 | HR 71 | Temp 97.4°F | Resp 18 | Ht 68.0 in | Wt 160.6 lb

## 2020-01-31 DIAGNOSIS — T451X5A Adverse effect of antineoplastic and immunosuppressive drugs, initial encounter: Secondary | ICD-10-CM | POA: Diagnosis not present

## 2020-01-31 DIAGNOSIS — C20 Malignant neoplasm of rectum: Secondary | ICD-10-CM

## 2020-01-31 DIAGNOSIS — G620 Drug-induced polyneuropathy: Secondary | ICD-10-CM | POA: Diagnosis not present

## 2020-01-31 DIAGNOSIS — D709 Neutropenia, unspecified: Secondary | ICD-10-CM | POA: Diagnosis not present

## 2020-01-31 DIAGNOSIS — Z79899 Other long term (current) drug therapy: Secondary | ICD-10-CM | POA: Diagnosis not present

## 2020-01-31 DIAGNOSIS — R599 Enlarged lymph nodes, unspecified: Secondary | ICD-10-CM | POA: Diagnosis not present

## 2020-01-31 DIAGNOSIS — D6959 Other secondary thrombocytopenia: Secondary | ICD-10-CM | POA: Diagnosis not present

## 2020-01-31 DIAGNOSIS — R197 Diarrhea, unspecified: Secondary | ICD-10-CM | POA: Diagnosis not present

## 2020-01-31 DIAGNOSIS — K8020 Calculus of gallbladder without cholecystitis without obstruction: Secondary | ICD-10-CM | POA: Diagnosis not present

## 2020-01-31 DIAGNOSIS — K449 Diaphragmatic hernia without obstruction or gangrene: Secondary | ICD-10-CM | POA: Diagnosis not present

## 2020-01-31 DIAGNOSIS — I700 Atherosclerosis of aorta: Secondary | ICD-10-CM | POA: Diagnosis not present

## 2020-01-31 DIAGNOSIS — Z9221 Personal history of antineoplastic chemotherapy: Secondary | ICD-10-CM | POA: Diagnosis not present

## 2020-01-31 MED ORDER — CAPECITABINE 500 MG PO TABS: ORAL_TABLET | ORAL | 0 refills | Status: DC

## 2020-01-31 NOTE — Progress Notes (Signed)
Hematology and Oncology Follow Up Visit  Jared White [id number] Jun 17, 1945 74 y.o. 01/31/2020 3:12 PM [name], MDGreene, [name], MD   Principle Diagnosis: 74 year old man with T3N2 rectal cancer diagnosed in March 2021.  Marland Kitchen   Prior Therapy:   He is status post colonoscopy and endoscopy completed on August 13, 2019.  Rectal biopsy showed an invasive adenocarcinoma.  Neoadjuvant chemotherapy utilizing FOLFOX started on September 05 2019.   He completed 7 cycles of therapy in August 2021.  Current therapy: Under evaluation to start radiation therapy with chemotherapy in the near future.  Interim History: [name] returns today for a follow-up evaluation.  Since the last visit,    Medications: Reviewed without changes. Current Outpatient Medications  Medication Sig Dispense Refill  . aspirin EC 81 MG tablet Take 1 tablet (81 mg total) by mouth daily. 90 tablet 3  . atenolol (TENORMIN) 50 MG tablet TAKE 1 TABLET BY MOUTH EVERY DAY 90 tablet 0  . calcium carbonate (OSCAL) 1500 (600 Ca) MG TABS tablet Take by mouth daily.    . clotrimazole-betamethasone (LOTRISONE) cream Apply 1 application topically 2 (two) times daily. 30 g 0  . famotidine (PEPCID) 20 MG tablet Take 1 tablet (20 mg total) by mouth 2 (two) times daily. 60 tablet 5  . fluticasone (FLONASE) 50 MCG/ACT nasal spray INSTILL 2 SPRAYS INTO EACH NOSTRIL EVERY DAY 48 g 2  . lidocaine-prilocaine (EMLA) cream Apply 1 application topically as needed. 30 g 0  . Magnesium Oxide 400 MG CAPS Take 1 capsule (400 mg total) by mouth 2 (two) times daily. 60 capsule 0  . nitroGLYCERIN (NITROSTAT) 0.4 MG SL tablet SMARTSIG:2 Tablet(s) Sublingual PRN    . Omega-3 Fatty Acids (OMEGA 3 PO) Take 520 mg by mouth daily.     Marland Kitchen omeprazole (PRILOSEC) 40 MG capsule Take 1 capsule (40 mg total) by mouth 2 (two) times daily. 90 capsule 3  . prochlorperazine (COMPAZINE) 10 MG tablet Take 1 tablet (10 mg total) by mouth every 6 (six) hours as needed  for nausea or vomiting. 30 tablet 0  . rosuvastatin (CRESTOR) 20 MG tablet TAKE 1 TABLET BY MOUTH EVERY DAY 90 tablet 2  . valsartan (DIOVAN) 160 MG tablet TAKE 1 TABLET BY MOUTH ONE TIME DAILY. 90 tablet 2  . vitamin B-12 (CYANOCOBALAMIN) 1000 MCG tablet Take 1,000 mcg by mouth daily.     No current facility-administered medications for this visit.     Allergies: No Known Allergies    Physical Exam:        ECOG: 1   General appearance: Comfortable appearing without any discomfort Head: Normocephalic without any trauma Oropharynx: Mucous membranes are moist and pink without any thrush or ulcers. Eyes: Pupils are equal and round reactive to light. Lymph nodes: No cervical, supraclavicular, inguinal or axillary lymphadenopathy.   Heart:regular rate and rhythm.  S1 and S2 without leg edema. Lung: Clear without any rhonchi or wheezes.  No dullness to percussion. Abdomin: Soft, nontender, nondistended with good bowel sounds.  No hepatosplenomegaly. Musculoskeletal: No joint deformity or effusion.  Full range of motion noted. Neurological: No deficits noted on motor, sensory and deep tendon reflex exam. Skin: No petechial rash or dryness.  Appeared moist.             Lab Results: Lab Results  Component Value Date   WBC 9.1 01/28/2020   HGB 11.3 (L) 01/28/2020   HCT 34.0 (L) 01/28/2020   MCV 100.6 (H) 01/28/2020  PLT 90 (L) 01/28/2020     Chemistry      Component Value Date/Time   NA 141 01/28/2020 1249   NA 140 12/09/2016 0932   K 3.1 (L) 01/28/2020 1249   CL 111 01/28/2020 1249   CO2 24 01/28/2020 1249   BUN 13 01/28/2020 1249   BUN 29 (H) 12/09/2016 0932   CREATININE 1.34 (H) 01/28/2020 1249   CREATININE 1.61 (H) 01/21/2016 1030      Component Value Date/Time   CALCIUM 8.9 01/28/2020 1249   CALCIUM 7.0 (L) 10/09/2015 0458   ALKPHOS 109 01/28/2020 1249   AST 27 01/28/2020 1249   ALT 25 01/28/2020 1249   BILITOT 0.3 01/28/2020 1249       IMPRESSION: 1. Stable to slightly larger mesorectal lymph node. No new or progressive pelvic adenopathy otherwise. 2. No findings for metastatic disease involving the chest or abdomen. 3. Stable advanced vascular disease. 4. Cholelithiasis. 5. Stable hiatal hernia.  Aortic Atherosclerosis (ICD10-I70.0).   Impression and Plan:  74 year old man with:  1.  T3N2 rectal cancer diagnosed in March 2021.    He is status post neoadjuvant chemotherapy and completed 7 cycles of FOLFOX in August 2021.  Therapy was complicated with cytopenias and has recovered at this time.  CT scan obtained on 01/28/2020 was reviewed and showed no evidence of metastatic disease.  Continues to have mesorectal lymph node that is remains prominent.   Treatment options moving forward were discussed.  Radiation therapy concomitantly with chemotherapy is the preferred option to continue total neoadjuvant therapy.  Upon completing neoadjuvant radiation and chemotherapy he will proceed with surgical resection.  The options of systemic chemotherapy concomitantly includes infusional 5-FU versus oral Xeloda.  Complication associated with both approaches were discussed.  Specifically oral Xeloda complications including adherence issues, hand-foot syndrome, diarrhea among others.  After discussion today he is agreeable to proceed with Xeloda.  We will start him tentatively on 1000 mg twice a day Monday through Friday with radiation.  Anticipate the duration of therapy 5 to 6 weeks.  2.  IV access: Port-A-Cath remains in place and will be flushed periodically.  3.  Antiemetics: Compazine will remain available to him.  4.  Neuropathy: Very little noted at this time after oxaliplatin.  5.  Neutropenia: Resolved after completing FOLFOX chemotherapy.  6.  Thrombocytopenia: Related to oxaliplatin and improving at this time.  No active bleeding or need for intervention.  7.  Follow-up: We will be in the next few weeks to  follow his progress.    30  minutes were dedicated to this visit.  The time was spent on reviewing the natural course of his disease, treatment options and addressing complications related to his current therapy.    [name], MD 9/10/20213:12 PM

## 2020-01-31 NOTE — Telephone Encounter (Signed)
Oral Oncology Patient Advocate Encounter  After completing a benefits investigation, prior authorization for Xeloda  is not required at this time through [facility].  Patient's copay is $33.70.     [place] Patient [place] Phone [phone] Fax [phone] 01/31/2020 3:45 PM

## 2020-02-03 ENCOUNTER — Telehealth: Payer: Self-pay | Admitting: Oncology

## 2020-02-03 NOTE — Telephone Encounter (Signed)
Oral Oncology Pharmacist Encounter  Received new prescription for Xeloda (capecitabine) for the treatment of rectal cancer in conjunction with radiation, planned for duration of radiation.  Prescription dose and frequency assessed for appropriateness. Appropriate for therapy initiation.   CBC and CMP from 01/28/20 assessed, noted Scr 1.34 (CrCl ~50 mL/min), for patient's with CrCl of 30-50 mL/min PI recommends dose reducing to 75% of intended dose. Patient's current Xeloda dose is ~534 mg/m2 - no further dose adjustments required at this time. Patient also with pltc of 90 K/uL - likely secondary to recent chemotherapy, no dose adjustments required.    Current medication list in Epic reviewed, DDIs with Xeloda identified:  Category C DDI between Xeloda and Omeprazole - omeprazole may decrease efficacy of Xeloda - will discuss with patient if he is able to switch to H2RA while on Xeloda.    Evaluated chart and no patient barriers to medication adherence noted.   Prescription has been e-scribed to the [facility] for benefits analysis and approval.  Oral Oncology Clinic will continue to follow for insurance authorization, copayment issues, initial counseling and start date.  Leron Croak, PharmD, BCPS Hematology/Oncology Clinical Pharmacist [facility] [phone] 02/03/2020 8:24 AM

## 2020-02-03 NOTE — Telephone Encounter (Signed)
Scheduled appointment per 9/10 los. Patient is aware of appointment date and time.

## 2020-02-04 ENCOUNTER — Ambulatory Visit (INDEPENDENT_AMBULATORY_CARE_PROVIDER_SITE_OTHER): Payer: Medicare Other | Admitting: Gastroenterology

## 2020-02-04 ENCOUNTER — Encounter: Payer: Self-pay | Admitting: Gastroenterology

## 2020-02-04 ENCOUNTER — Other Ambulatory Visit: Payer: Self-pay

## 2020-02-04 ENCOUNTER — Ambulatory Visit
Admission: RE | Admit: 2020-02-04 | Discharge: 2020-02-04 | Disposition: A | Discharge status: Home / Self Care | Payer: Medicare Other | Source: Ambulatory Visit | Attending: Radiation Oncology | Admitting: Radiation Oncology

## 2020-02-04 VITALS — BP 124/66 | HR 68 | Ht 68.0 in | Wt 158.2 lb

## 2020-02-04 DIAGNOSIS — K2270 Barrett's esophagus without dysplasia: Secondary | ICD-10-CM

## 2020-02-04 DIAGNOSIS — K317 Polyp of stomach and duodenum: Secondary | ICD-10-CM | POA: Diagnosis not present

## 2020-02-04 DIAGNOSIS — Z510 Encounter for antineoplastic radiation therapy: Secondary | ICD-10-CM | POA: Diagnosis not present

## 2020-02-04 DIAGNOSIS — Z86010 Personal history of colonic polyps: Secondary | ICD-10-CM | POA: Diagnosis not present

## 2020-02-04 DIAGNOSIS — C20 Malignant neoplasm of rectum: Secondary | ICD-10-CM | POA: Insufficient documentation

## 2020-02-04 DIAGNOSIS — Z85048 Personal history of other malignant neoplasm of rectum, rectosigmoid junction, and anus: Secondary | ICD-10-CM

## 2020-02-04 NOTE — Patient Instructions (Signed)
If you are age 74 or older, your body mass index should be between 23-30. Your Body mass index is 24.05 kg/m. If this is out of the aforementioned range listed, please consider follow up with your Primary Care Provider.  If you are age 103 or younger, your body mass index should be between 19-25. Your Body mass index is 24.05 kg/m. If this is out of the aformentioned range listed, please consider follow up with your Primary Care Provider   You will be contacted by the end of the week on if you will need to be scoped or not .   Plan for EGD/ Flex sig in 6-12 months   Thank you for choosing me and [facility].  Dr. Rush Landmark

## 2020-02-05 ENCOUNTER — Other Ambulatory Visit: Payer: Self-pay | Admitting: Gastroenterology

## 2020-02-05 ENCOUNTER — Encounter: Payer: Self-pay | Admitting: Radiation Oncology

## 2020-02-05 DIAGNOSIS — K209 Esophagitis, unspecified without bleeding: Secondary | ICD-10-CM

## 2020-02-05 DIAGNOSIS — K2970 Gastritis, unspecified, without bleeding: Secondary | ICD-10-CM

## 2020-02-05 NOTE — Progress Notes (Signed)
The patient has a gastric polyp that was going to be followed up on by Dr. Rush Landmark, after further discussion via in basket between colorectal surgery, medical oncology, and radiation oncology, it was felt that further EGD could be postponed to a later time, and that he did not need additional colonoscopy evaluation prior to proceeding with chemoradiation.    Carola Rhine, PAC

## 2020-02-07 ENCOUNTER — Encounter: Payer: Self-pay | Admitting: Gastroenterology

## 2020-02-07 DIAGNOSIS — Z85048 Personal history of other malignant neoplasm of rectum, rectosigmoid junction, and anus: Secondary | ICD-10-CM | POA: Insufficient documentation

## 2020-02-07 DIAGNOSIS — Z86010 Personal history of colonic polyps: Secondary | ICD-10-CM | POA: Insufficient documentation

## 2020-02-07 DIAGNOSIS — K2270 Barrett's esophagus without dysplasia: Secondary | ICD-10-CM | POA: Insufficient documentation

## 2020-02-07 DIAGNOSIS — K317 Polyp of stomach and duodenum: Secondary | ICD-10-CM | POA: Insufficient documentation

## 2020-02-07 NOTE — Progress Notes (Signed)
[employer] VISIT   Primary Care Provider [name], MD [address] [place] [address] [phone]  Patient Profile: Jared White is a 74 y.o. male with a pmh significant for CAD (status post CABG), CHF, hypertension, hyperlipidemia, chronic renal insufficiency, nephrolithiasis, rectal cancer (undergoing chemo-XRT), diverticulosis, Barrett's esophagus, colon polyps, gastric polyps.  The patient presents to the [facility] for an evaluation and management of problem(s) noted below:  Problem List 1. Gastric polyps   2. Barrett's esophagus without dysplasia   3. History of rectal cancer   4. History of colonic polyps     History of Present Illness Please see initial consultation note for full details of HPI.  Interval History The patient has completed chemotherapy.  He is planned for upcoming radiation therapy to his rectal cancer.  He was initially noted to have a repeat colonoscopy scheduled for later this month though was not clear why this was necessary.  He has a gastric polyp that is likely hyperplastic and will need an EMR at some point in time but is not been causing him any issues.  The patient describes feeling okay even during the course of his treatments.  He is ready for radiation.  He does not want to undergo repeat colonoscopy unless absolutely necessary.  Patient denies significant abdominal pain nausea or vomiting or dysphagia.  GI Review of Systems Positive as above including some episodes of bright red blood per rectum still Negative for odynophagia, pain, change in bowel habits  Review of Systems General: Denies fevers/chills Cardiovascular: Denies chest pain Pulmonary: Denies shortness of breath Gastroenterological: See HPI Genitourinary: Denies darkened urine Hematological: Denies easy bruising/bleeding Dermatological: Denies jaundice Psychological: Mood is stable   Medications Current Outpatient  Medications  Medication Sig Dispense Refill  . aspirin EC 81 MG tablet Take 1 tablet (81 mg total) by mouth daily. 90 tablet 3  . atenolol (TENORMIN) 50 MG tablet TAKE 1 TABLET BY MOUTH EVERY DAY 90 tablet 0  . calcium carbonate (OSCAL) 1500 (600 Ca) MG TABS tablet Take by mouth daily.    . capecitabine (XELODA) 500 MG tablet Take 2 tablets twice a day, Monday through Friday with radiation. 120 tablet 0  . famotidine (PEPCID) 20 MG tablet Take 1 tablet (20 mg total) by mouth 2 (two) times daily. 60 tablet 5  . fluticasone (FLONASE) 50 MCG/ACT nasal spray INSTILL 2 SPRAYS INTO EACH NOSTRIL EVERY DAY 48 g 2  . Magnesium Oxide 400 MG CAPS Take 1 capsule (400 mg total) by mouth 2 (two) times daily. 60 capsule 0  . nitroGLYCERIN (NITROSTAT) 0.4 MG SL tablet SMARTSIG:2 Tablet(s) Sublingual PRN    . Omega-3 Fatty Acids (OMEGA 3 PO) Take 520 mg by mouth daily.     . prochlorperazine (COMPAZINE) 10 MG tablet Take 1 tablet (10 mg total) by mouth every 6 (six) hours as needed for nausea or vomiting. 30 tablet 0  . rosuvastatin (CRESTOR) 20 MG tablet TAKE 1 TABLET BY MOUTH EVERY DAY 90 tablet 2  . valsartan (DIOVAN) 160 MG tablet TAKE 1 TABLET BY MOUTH ONE TIME DAILY. 90 tablet 2  . vitamin B-12 (CYANOCOBALAMIN) 1000 MCG tablet Take 1,000 mcg by mouth daily.    Marland Kitchen omeprazole (PRILOSEC) 40 MG capsule TAKE 1 CAPSULE BY MOUTH TWICE A DAY 180 capsule 1   No current facility-administered medications for this visit.    Allergies No Known Allergies  Histories Past Medical History:  Diagnosis Date  . Broken back   .  CAD (coronary artery disease)    PCI & CABG  . CHF (congestive heart failure) ([place])   . Diverticulitis    mild - approx 2004  . Family history of heart disease   . GERD (gastroesophageal reflux disease)   . History of nuclear stress test 06/30/2011   lexiscan; normal pattern of perfusion post-stress; low risk scan   . Hyperlipidemia   . Hypertension   . IgA nephropathy   . Irregular heart  beat   . Rectal cancer ([place]) 08/19/2019  . Systemic hypertension    Past Surgical History:  Procedure Laterality Date  . CARDIAC CATHETERIZATION  08/1995   PTCA of OM (Dr. Marella Chimes)  . CORONARY ARTERY BYPASS GRAFT  01/2000   x5 - LIMA to LAD, SVG to diagonal, sequential SVG to ramus & OM, SVG to PDA (Dr. Tharon Aquas Trigt)  . IR IMAGING GUIDED PORT INSERTION  09/04/2019  . LUNG LOBECTOMY     left upper lobe  . TRANSTHORACIC ECHOCARDIOGRAM  12/24/2012   EF 55-60%, mild LVH, mild conc hypertrophy; mild MR; LA mildly dilated   Social History   Socioeconomic History  . Marital status: Single    Spouse name: n/a  . Number of children: 0  . Years of education: Not on file  . Highest education level: Not on file  Occupational History  . Occupation: Chief Strategy Officer, Holiday representative  Tobacco Use  . Smoking status: Never Smoker  . Smokeless tobacco: Never Used  Vaping Use  . Vaping Use: Never used  Substance and Sexual Activity  . Alcohol use: Not Currently  . Drug use: No  . Sexual activity: Not on file  Other Topics Concern  . Not on file  Social History Narrative   Lives alone.   Sister-in-Law lives next door.   Social Determinants of Health   Financial Resource Strain: Low Risk   . Difficulty of Paying Living Expenses: Not hard at all  Food Insecurity: No Food Insecurity  . Worried About Charity fundraiser in the Last Year: Never true  . Ran Out of Food in the Last Year: Never true  Transportation Needs: No Transportation Needs  . Lack of Transportation (Medical): No  . Lack of Transportation (Non-Medical): No  Physical Activity:   . Days of Exercise per Week: Not on file  . Minutes of Exercise per Session: Not on file  Stress: No Stress Concern Present  . Feeling of Stress : Only a little  Social Connections: Socially Isolated  . Frequency of Communication with Friends and Family: More than three times a week  . Frequency of Social Gatherings with Friends and Family:  More than three times a week  . Attends Religious Services: Never  . Active Member of Clubs or Organizations: No  . Attends Archivist Meetings: Never  . Marital Status: Never married  Intimate Partner Violence:   . Fear of Current or Ex-Partner: Not on file  . Emotionally Abused: Not on file  . Physically Abused: Not on file  . Sexually Abused: Not on file   Family History  Problem Relation Age of Onset  . Heart attack Father   . Kidney failure Brother   . Prostate cancer Brother   . Heart attack Brother   . Heart disease Brother   . Hypertension Brother   . Heart disease Brother   . Hypertension Brother   . Colon cancer Neg Hx   . Esophageal cancer Neg Hx   . Rectal cancer  Neg Hx   . Stomach cancer Neg Hx   . Inflammatory bowel disease Neg Hx   . Liver disease Neg Hx   . Pancreatic cancer Neg Hx    I have reviewed his medical, social, and family history in detail and updated the electronic medical record as necessary.    PHYSICAL EXAMINATION  BP 124/66   Pulse 68   Ht 5\' 8"  (1.727 m)   Wt 158 lb 3.2 oz (71.8 kg)   BMI 24.05 kg/m  Wt Readings from Last 3 Encounters:  02/04/20 158 lb 3.2 oz (71.8 kg)  01/31/20 160 lb 9.6 oz (72.8 kg)  01/21/20 156 lb (70.8 kg)  GEN: NAD, appears stated age, doesn't appear chronically ill PSYCH: Cooperative, without pressured speech EYE: Conjunctivae pink, sclerae anicteric ENT: MMM CV: Nontachycardic RESP: No audible wheezing GI: NABS, soft, mildly protuberant abdomen, nontender, without rebound or guarding  MSK/EXT: Bilateral pedal edema present SKIN: No jaundice NEURO:  Alert & Oriented x 3, no focal deficits   REVIEW OF DATA  I reviewed the following data at the time of this encounter:  GI Procedures and Studies  March 2021 colonoscopy - Palpable rectal mass found on digital rectal exam. - Four 3 to 14 mm polyps in the ascending colon and in the cecum, removed with a cold snare. Resected and retrieved. Clips  (MR conditional) were placed. - Diverticulosis in the entire examined colon. - Rule out malignancy, partially obstructing tumor from anus to rectum. Biopsied. - Non-bleeding non-thrombosed external and internal hemorrhoids.  March 2021 EGD - No gross lesions in esophagus proximally. - Salmon-colored mucosa suspicious for short-segment Barrett's esophagus distally. Biopsied. - Congested, granular mucosa at the [employer]. Biopsied. - 3 cm hiatal hernia. - A single gastric polyp in hiatal hernia. Biopsied. - Hematin (altered blood/coffee-ground-like material) in the cardia, in the gastric fundus and in the gastric body. - Gastritis. Biopsied. - Erythematous duodenopathy. Biopsied. - Non-bleeding duodenal diverticulum in distal D2/D3 region.  Pathology Diagnosis 1. Duodenum, Biopsy - PEPTIC DUODENITIS. - NO DYSPLASIA OR MALIGNANCY. 2. Stomach, biopsy, random - MILD REACTIVE GASTROPATHY WITH EROSIONS. - WARTHIN-STARRY IS NEGATIVE FOR HELICOBACTER PYLORI. - NO INTESTINAL METAPLASIA, DYSPLASIA, OR MALIGNANCY. 3. Stomach, polyp(s) - HYPERPLASTIC POLYP. - NO INTESTINAL METAPLASIA, DYSPLASIA, OR MALIGNANCY. 4. Esophagogastric junction, biopsy - REFLUX CHANGES. - NO INTESTINAL METAPLASIA, DYSPLASIA, OR MALIGNANCY. 5. Esophagus, biopsy, distal - INTESTINAL METAPLASIA CONSISTENT WITH BARRETT'S ESOPHAGUS. - NO DYSPLASIA OR MALIGNANCY. 6. Cecum Polyp, ascending, (4) - TUBULAR ADENOMA (MULTIPLE FRAGMENTS). - NO HIGH GRADE DYSPLASIA OR MALIGNANCY. 7. Rectum, polyp(s), mass bx - INVASIVE ADENOCARCINOMA.  Laboratory Studies  Reviewed those in epic  Imaging Studies  September 2021 CT abdomen pelvis without contrast IMPRESSION: 1. Stable to slightly larger mesorectal lymph node. No new or progressive pelvic adenopathy otherwise. 2. No findings for metastatic disease involving the chest or abdomen. 3. Stable advanced vascular disease. 4. Cholelithiasis. 5. Stable hiatal  hernia.   ASSESSMENT  Mr. Old is a 74 y.o. male with a pmh significant for CAD (status post CABG), CHF, hypertension, hyperlipidemia, chronic renal insufficiency, nephrolithiasis, rectal cancer (undergoing chemo-XRT), diverticulosis, Barrett's esophagus, colon polyps, gastric polyps. The patient is seen today for evaluation and management of:  1. Gastric polyps   2. Barrett's esophagus without dysplasia   3. History of rectal cancer   4. History of colonic polyps    The patient is hemodynamically stable.  Clinically he is doing okay in regards to his treatment for his rectal cancer.  He is awaiting upcoming radiation therapy and hopeful that will take care of things but he understands that surgery may still be indicated.  It is not clear to me that the patient actually needs a repeat endoscopic evaluation from below at this time.  I will reach out to oncology and radiation oncology and surgery to ensure that we do not need repeat endoscopic evaluation before he begins radiation treatment.  If he does then we will work on getting him set up as quickly as possible.  Endoscopic evaluation from above with attempt at resection of the gastric polyp as well as follow-up of Barrett's esophagus can be performed at any time point and is not an issue at this time for the patient.  I will reconvene the need for endoscopic evaluation resection in approximately 6 months.  He will maintain PPI therapy for now.  All patient questions were answered to the best of my ability, and the patient agrees to the aforementioned plan of action with follow-up as indicated.   PLAN  Continue PPI therapy Endoscopy with EMR to be scheduled in approximately 6 months and for follow-up of Barrett's esophagus as well We will reach out to oncology/radiation oncology/surgery to ensure that they do not need repeat flexible sigmoidoscopy prior to radiation treatment otherwise we will get that scheduled FiberCon daily Bowel regimen as  previously described   No orders of the defined types were placed in this encounter.   New Prescriptions   No medications on file   Modified Medications   Modified Medication Previous Medication   OMEPRAZOLE (PRILOSEC) 40 MG CAPSULE omeprazole (PRILOSEC) 40 MG capsule      TAKE 1 CAPSULE BY MOUTH TWICE A DAY    Take 1 capsule (40 mg total) by mouth 2 (two) times daily.    Planned Follow Up No follow-ups on file.   Total Time in Face-to-Face and in Coordination of Care for patient including independent/personal interpretation/review of prior testing, medical history, examination, medication adjustment, communicating results with the patient directly, and documentation with the EHR is 25 minutes.   Justice Britain, MD [facility] Advanced [facility]oscopy Office # [phone]

## 2020-02-11 MED FILL — CAPECITABINE 500 MG TABLET: 500 | 28 days supply | Qty: 80 | Fill #0

## 2020-02-11 NOTE — Telephone Encounter (Signed)
Oral Chemotherapy Pharmacist Encounter  I spoke with patient for overview of: Xeloda for the treatment of rectal cancer in conjunction with radiation, planned duration 5 1/2 - 6 weeks.   Counseled patient on administration, dosing, side effects, monitoring, drug-food interactions, safe handling, storage, and disposal.  Patient will take Xeloda 500mg  tablets, 2 tablets (1000mg ) by mouth in AM and 2 tabs (1000mg ) by mouth in PM, within 30 minutes of finishing meals, on days of radiation only.  Xeloda and radiation start date: 02/17/20  Adverse effects of Xeloda include but are not limited to: fatigue, decreased blood counts, GI upset, diarrhea, mouth sores, and hand-foot syndrome.  Patient has anti-emetic on hand and knows to take it if nausea develops.   Patient will obtain anti diarrheal and alert the office of 4 or more loose stools above baseline.   Reviewed with patient importance of keeping a medication schedule and plan for any missed doses. No barriers to medication adherence identified.  Discussed drug-drug interaction between omeprazole and Xeloda with patient. We discussed that omeprazole may decrease the efficacy of Xeloda while he is on therapy. He stated he will switch to famotidine, which he states he has taken in the past, while on Xeloda.   Medication reconciliation performed and medication/allergy list updated.  Patient will pick up the medication from the [facility] on 02/11/20. He knows not to start Xeloda until 02/17/20.  Patient informed the pharmacy will reach out 5-7 days prior to needing next fill of Xeloda to coordinate continued medication acquisition to prevent break in therapy.  All questions answered.  Mr. Taaffe voiced understanding and appreciation.   Medication education handout placed in mail for patient. Patient knows to call the office with questions or concerns. Oral Chemotherapy Clinic phone number provided to patient.   Leron Croak, PharmD, BCPS Hematology/Oncology Clinical Pharmacist [facility] [phone] 02/11/2020 10:45 AM

## 2020-02-12 ENCOUNTER — Other Ambulatory Visit: Payer: Self-pay | Admitting: Cardiology

## 2020-02-12 DIAGNOSIS — C20 Malignant neoplasm of rectum: Secondary | ICD-10-CM | POA: Diagnosis not present

## 2020-02-12 DIAGNOSIS — Z510 Encounter for antineoplastic radiation therapy: Secondary | ICD-10-CM | POA: Diagnosis not present

## 2020-02-13 ENCOUNTER — Encounter: Payer: Medicare Other | Admitting: Gastroenterology

## 2020-02-17 ENCOUNTER — Other Ambulatory Visit: Payer: Self-pay

## 2020-02-17 ENCOUNTER — Ambulatory Visit
Admission: RE | Admit: 2020-02-17 | Discharge: 2020-02-17 | Disposition: A | Discharge status: Home / Self Care | Payer: Medicare Other | Source: Ambulatory Visit | Attending: Radiation Oncology | Admitting: Radiation Oncology

## 2020-02-17 DIAGNOSIS — Z510 Encounter for antineoplastic radiation therapy: Secondary | ICD-10-CM | POA: Diagnosis not present

## 2020-02-17 DIAGNOSIS — C20 Malignant neoplasm of rectum: Secondary | ICD-10-CM | POA: Diagnosis not present

## 2020-02-18 ENCOUNTER — Ambulatory Visit
Admission: RE | Admit: 2020-02-18 | Discharge: 2020-02-18 | Disposition: A | Discharge status: Home / Self Care | Payer: Medicare Other | Source: Ambulatory Visit | Attending: Radiation Oncology | Admitting: Radiation Oncology

## 2020-02-18 ENCOUNTER — Other Ambulatory Visit: Payer: Self-pay

## 2020-02-18 DIAGNOSIS — C20 Malignant neoplasm of rectum: Secondary | ICD-10-CM | POA: Diagnosis not present

## 2020-02-18 DIAGNOSIS — Z510 Encounter for antineoplastic radiation therapy: Secondary | ICD-10-CM | POA: Diagnosis not present

## 2020-02-19 ENCOUNTER — Telehealth: Payer: Self-pay | Admitting: *Deleted

## 2020-02-19 ENCOUNTER — Ambulatory Visit
Admission: RE | Admit: 2020-02-19 | Discharge: 2020-02-19 | Disposition: A | Discharge status: Home / Self Care | Payer: Medicare Other | Source: Ambulatory Visit | Attending: Radiation Oncology | Admitting: Radiation Oncology

## 2020-02-19 DIAGNOSIS — Z510 Encounter for antineoplastic radiation therapy: Secondary | ICD-10-CM | POA: Diagnosis not present

## 2020-02-19 DIAGNOSIS — C20 Malignant neoplasm of rectum: Secondary | ICD-10-CM | POA: Diagnosis not present

## 2020-02-19 NOTE — Telephone Encounter (Signed)
PATIENT SAW [place] ON [birth year] @ [place]

## 2020-02-20 ENCOUNTER — Encounter: Payer: Medicare Other | Admitting: Gastroenterology

## 2020-02-20 ENCOUNTER — Ambulatory Visit
Admission: RE | Admit: 2020-02-20 | Discharge: 2020-02-20 | Disposition: A | Discharge status: Home / Self Care | Payer: Medicare Other | Source: Ambulatory Visit | Attending: Radiation Oncology | Admitting: Radiation Oncology

## 2020-02-20 ENCOUNTER — Other Ambulatory Visit: Payer: Self-pay

## 2020-02-20 DIAGNOSIS — C20 Malignant neoplasm of rectum: Secondary | ICD-10-CM | POA: Diagnosis not present

## 2020-02-20 DIAGNOSIS — Z510 Encounter for antineoplastic radiation therapy: Secondary | ICD-10-CM | POA: Diagnosis not present

## 2020-02-20 NOTE — Progress Notes (Signed)
Pt here for patient teaching.  Pt given Radiation and You booklet.  Reviewed areas of pertinence such as diarrhea, fatigue, hair loss, sexual and fertility changes, skin changes and urinary and bladder changes . Pt able to give teach back of to pat skin, use unscented/gentle soap, use baby wipes, have Imodium on hand, drink plenty of water and sitz bath,avoid applying anything to skin within 4 hours of treatment. Pt verbalizes understanding of information given and will contact nursing with any questions or concerns.     Sherle Mello M. Malea Swilling RN, BSN     

## 2020-02-21 ENCOUNTER — Ambulatory Visit
Admission: RE | Admit: 2020-02-21 | Discharge: 2020-02-21 | Disposition: A | Discharge status: Home / Self Care | Payer: Medicare Other | Source: Ambulatory Visit | Attending: Radiation Oncology | Admitting: Radiation Oncology

## 2020-02-21 DIAGNOSIS — C20 Malignant neoplasm of rectum: Secondary | ICD-10-CM | POA: Diagnosis not present

## 2020-02-21 DIAGNOSIS — Z510 Encounter for antineoplastic radiation therapy: Secondary | ICD-10-CM | POA: Insufficient documentation

## 2020-02-24 ENCOUNTER — Ambulatory Visit
Admission: RE | Admit: 2020-02-24 | Discharge: 2020-02-24 | Disposition: A | Discharge status: Home / Self Care | Payer: Medicare Other | Source: Ambulatory Visit | Attending: Radiation Oncology | Admitting: Radiation Oncology

## 2020-02-24 DIAGNOSIS — C20 Malignant neoplasm of rectum: Secondary | ICD-10-CM | POA: Diagnosis not present

## 2020-02-24 DIAGNOSIS — Z510 Encounter for antineoplastic radiation therapy: Secondary | ICD-10-CM | POA: Diagnosis not present

## 2020-02-25 ENCOUNTER — Ambulatory Visit
Admission: RE | Admit: 2020-02-25 | Discharge: 2020-02-25 | Disposition: A | Discharge status: Home / Self Care | Payer: Medicare Other | Source: Ambulatory Visit | Attending: Radiation Oncology | Admitting: Radiation Oncology

## 2020-02-25 DIAGNOSIS — Z510 Encounter for antineoplastic radiation therapy: Secondary | ICD-10-CM | POA: Diagnosis not present

## 2020-02-25 DIAGNOSIS — C20 Malignant neoplasm of rectum: Secondary | ICD-10-CM | POA: Diagnosis not present

## 2020-02-26 ENCOUNTER — Inpatient Hospital Stay (HOSPITAL_BASED_OUTPATIENT_CLINIC_OR_DEPARTMENT_OTHER): Payer: Medicare Other | Admitting: Oncology

## 2020-02-26 ENCOUNTER — Inpatient Hospital Stay: Payer: Medicare Other

## 2020-02-26 ENCOUNTER — Other Ambulatory Visit: Payer: Self-pay

## 2020-02-26 ENCOUNTER — Ambulatory Visit
Admission: RE | Admit: 2020-02-26 | Discharge: 2020-02-26 | Disposition: A | Discharge status: Home / Self Care | Payer: Medicare Other | Source: Ambulatory Visit | Attending: Radiation Oncology | Admitting: Radiation Oncology

## 2020-02-26 ENCOUNTER — Inpatient Hospital Stay: Payer: Medicare Other | Attending: Oncology

## 2020-02-26 VITALS — BP 164/83 | HR 67 | Temp 97.2°F | Resp 18 | Ht 68.0 in | Wt 160.1 lb

## 2020-02-26 DIAGNOSIS — Z841 Family history of disorders of kidney and ureter: Secondary | ICD-10-CM | POA: Insufficient documentation

## 2020-02-26 DIAGNOSIS — G629 Polyneuropathy, unspecified: Secondary | ICD-10-CM | POA: Insufficient documentation

## 2020-02-26 DIAGNOSIS — Z95828 Presence of other vascular implants and grafts: Secondary | ICD-10-CM

## 2020-02-26 DIAGNOSIS — K219 Gastro-esophageal reflux disease without esophagitis: Secondary | ICD-10-CM | POA: Diagnosis not present

## 2020-02-26 DIAGNOSIS — D709 Neutropenia, unspecified: Secondary | ICD-10-CM | POA: Insufficient documentation

## 2020-02-26 DIAGNOSIS — Z79899 Other long term (current) drug therapy: Secondary | ICD-10-CM | POA: Diagnosis not present

## 2020-02-26 DIAGNOSIS — I2510 Atherosclerotic heart disease of native coronary artery without angina pectoris: Secondary | ICD-10-CM | POA: Diagnosis not present

## 2020-02-26 DIAGNOSIS — I10 Essential (primary) hypertension: Secondary | ICD-10-CM | POA: Insufficient documentation

## 2020-02-26 DIAGNOSIS — Z510 Encounter for antineoplastic radiation therapy: Secondary | ICD-10-CM | POA: Diagnosis not present

## 2020-02-26 DIAGNOSIS — Z8249 Family history of ischemic heart disease and other diseases of the circulatory system: Secondary | ICD-10-CM | POA: Insufficient documentation

## 2020-02-26 DIAGNOSIS — C20 Malignant neoplasm of rectum: Secondary | ICD-10-CM | POA: Insufficient documentation

## 2020-02-26 DIAGNOSIS — T282XXA Burn of other parts of alimentary tract, initial encounter: Secondary | ICD-10-CM | POA: Diagnosis not present

## 2020-02-26 DIAGNOSIS — K625 Hemorrhage of anus and rectum: Secondary | ICD-10-CM | POA: Diagnosis not present

## 2020-02-26 DIAGNOSIS — D696 Thrombocytopenia, unspecified: Secondary | ICD-10-CM | POA: Diagnosis not present

## 2020-02-26 DIAGNOSIS — R197 Diarrhea, unspecified: Secondary | ICD-10-CM | POA: Diagnosis not present

## 2020-02-26 DIAGNOSIS — Z8042 Family history of malignant neoplasm of prostate: Secondary | ICD-10-CM | POA: Diagnosis not present

## 2020-02-26 DIAGNOSIS — Z923 Personal history of irradiation: Secondary | ICD-10-CM | POA: Insufficient documentation

## 2020-02-26 LAB — CMP (CANCER CENTER ONLY)
ALT: 22 U/L (ref 0–44)
AST: 28 U/L (ref 15–41)
Albumin: 3.5 g/dL (ref 3.5–5.0)
Alkaline Phosphatase: 89 U/L (ref 38–126)
Anion gap: 6 (ref 5–15)
BUN: 24 mg/dL — ABNORMAL HIGH (ref 8–23)
CO2: 25 mmol/L (ref 22–32)
Calcium: 9.7 mg/dL (ref 8.9–10.3)
Chloride: 106 mmol/L (ref 98–111)
Creatinine: 1.44 mg/dL — ABNORMAL HIGH (ref 0.61–1.24)
GFR, Estimated: 47 mL/min — ABNORMAL LOW (ref >60)
Glucose, Bld: 139 mg/dL — ABNORMAL HIGH (ref 70–99)
Potassium: 4.1 mmol/L (ref 3.5–5.1)
Sodium: 137 mmol/L (ref 135–145)
Total Bilirubin: 0.4 mg/dL (ref 0.3–1.2)
Total Protein: 7 g/dL (ref 6.5–8.1)

## 2020-02-26 LAB — CBC WITH DIFFERENTIAL (CANCER CENTER ONLY)
Abs Immature Granulocytes: 0.03 10*3/uL (ref 0.00–0.07)
Basophils Absolute: 0.1 10*3/uL (ref 0.0–0.1)
Basophils Relative: 1 %
Eosinophils Absolute: 0.2 10*3/uL (ref 0.0–0.5)
Eosinophils Relative: 4 %
HCT: 37 % — ABNORMAL LOW (ref 39.0–52.0)
Hemoglobin: 12.3 g/dL — ABNORMAL LOW (ref 13.0–17.0)
Immature Granulocytes: 1 %
Lymphocytes Relative: 17 %
Lymphs Abs: 0.8 10*3/uL (ref 0.7–4.0)
MCH: 33.5 pg (ref 26.0–34.0)
MCHC: 33.2 g/dL (ref 30.0–36.0)
MCV: 100.8 fL — ABNORMAL HIGH (ref 80.0–100.0)
Monocytes Absolute: 0.4 10*3/uL (ref 0.1–1.0)
Monocytes Relative: 9 %
Neutro Abs: 3.1 10*3/uL (ref 1.7–7.7)
Neutrophils Relative %: 68 %
Platelet Count: 91 10*3/uL — ABNORMAL LOW (ref 150–400)
RBC: 3.67 MIL/uL — ABNORMAL LOW (ref 4.22–5.81)
RDW: 13.8 % (ref 11.5–15.5)
WBC Count: 4.5 10*3/uL (ref 4.0–10.5)
nRBC: 0 % (ref 0.0–0.2)

## 2020-02-26 MED ORDER — SODIUM CHLORIDE 0.9% FLUSH
10.0000 mL | Freq: Once | INTRAVENOUS | Status: AC
  Administered 2020-02-26: 10 mL
  Filled 2020-02-26: qty 10

## 2020-02-26 MED ORDER — HEPARIN SOD (PORK) LOCK FLUSH 100 UNIT/ML IV SOLN
500.0000 [IU] | Freq: Once | INTRAVENOUS | Status: AC
  Administered 2020-02-26: 500 [IU]
  Filled 2020-02-26: qty 5

## 2020-02-26 NOTE — Patient Instructions (Signed)

## 2020-02-26 NOTE — Progress Notes (Signed)
Hematology and Oncology Follow Up Visit  Jared White [id number] 11/04/1945 74 y.o. 02/26/2020 8:35 AM Jared White, MDGreene, [name], MD   Principle Diagnosis: 74 year old man with rectal cancer diagnosed in March 2021.  He was found to have T3N2 disease.   Prior Therapy:   He is status post colonoscopy and endoscopy completed on August 13, 2019.  Rectal biopsy showed an invasive adenocarcinoma.  Neoadjuvant chemotherapy utilizing FOLFOX started on September 05 2019.   He completed 7 cycles of therapy in August 2021.  Current therapy: Radiation therapy with oral Xeloda started on September 27.   Interim History: [name] is here for repeat follow-up.  Since the last visit, he started radiation therapy with oral Xeloda without any complications.  He denies any nausea, vomiting or abdominal pain.  He does report loose bowel habits although no severe diarrhea.  He denies any worsening neuropathy or excessive fatigue.  Performance status and quality of life remained excellent.    Medications: Unchanged on review. Current Outpatient Medications  Medication Sig Dispense Refill  . aspirin EC 81 MG tablet Take 1 tablet (81 mg total) by mouth daily. 90 tablet 3  . atenolol (TENORMIN) 50 MG tablet TAKE 1 TABLET BY MOUTH EVERY DAY 90 tablet 0  . calcium carbonate (OSCAL) 1500 (600 Ca) MG TABS tablet Take by mouth daily.    . capecitabine (XELODA) 500 MG tablet Take 2 tablets twice a day, Monday through Friday with radiation. 120 tablet 0  . famotidine (PEPCID) 20 MG tablet Take 1 tablet (20 mg total) by mouth 2 (two) times daily. 60 tablet 5  . fluticasone (FLONASE) 50 MCG/ACT nasal spray INSTILL 2 SPRAYS INTO EACH NOSTRIL EVERY DAY 48 g 2  . Magnesium Oxide 400 MG CAPS Take 1 capsule (400 mg total) by mouth 2 (two) times daily. 60 capsule 0  . nitroGLYCERIN (NITROSTAT) 0.4 MG SL tablet SMARTSIG:2 Tablet(s) Sublingual PRN    . Omega-3 Fatty Acids (OMEGA 3 PO) Take 520 mg by mouth daily.      Marland Kitchen omeprazole (PRILOSEC) 40 MG capsule TAKE 1 CAPSULE BY MOUTH TWICE A DAY 180 capsule 1  . prochlorperazine (COMPAZINE) 10 MG tablet Take 1 tablet (10 mg total) by mouth every 6 (six) hours as needed for nausea or vomiting. 30 tablet 0  . rosuvastatin (CRESTOR) 20 MG tablet TAKE 1 TABLET BY MOUTH EVERY DAY 90 tablet 2  . valsartan (DIOVAN) 160 MG tablet TAKE 1 TABLET BY MOUTH EVERY DAY 90 tablet 0  . vitamin B-12 (CYANOCOBALAMIN) 1000 MCG tablet Take 1,000 mcg by mouth daily.     No current facility-administered medications for this visit.   Facility-Administered Medications Ordered in Other Visits  Medication Dose Route Frequency Provider Last Rate Last Admin  . sodium chloride flush (NS) 0.9 % injection 10 mL  10 mL Intracatheter Once Jared Portela, MD         Allergies: No Known Allergies    Physical Exam:     Blood pressure (!) 164/83, pulse 67, temperature (!) 97.2 F (36.2 C), temperature source Tympanic, resp. rate 18, height 5\' 8"  (1.727 m), weight 160 lb 1.6 oz (72.6 kg), SpO2 98 %.     ECOG: 1    General appearance: Alert, awake without any distress. Head: Atraumatic without abnormalities Oropharynx: Without any thrush or ulcers. Eyes: No scleral icterus. Lymph nodes: No lymphadenopathy noted in the cervical, supraclavicular, or axillary nodes Heart:regular rate and rhythm, without any murmurs or gallops.  Lung: Clear to auscultation without any rhonchi, wheezes or dullness to percussion. Abdomin: Soft, nontender without any shifting dullness or ascites. Musculoskeletal: No clubbing or cyanosis. Neurological: No motor or sensory deficits. Skin: No rashes or lesions. Psychiatric: Mood and affect appeared normal.             Lab Results: Lab Results  Component Value Date   WBC 9.1 01/28/2020   HGB 11.3 (L) 01/28/2020   HCT 34.0 (L) 01/28/2020   MCV 100.6 (H) 01/28/2020   PLT 90 (L) 01/28/2020     Chemistry      Component Value Date/Time    NA 141 01/28/2020 1249   NA 140 12/09/2016 0932   K 3.1 (L) 01/28/2020 1249   CL 111 01/28/2020 1249   CO2 24 01/28/2020 1249   BUN 13 01/28/2020 1249   BUN 29 (H) 12/09/2016 0932   CREATININE 1.34 (H) 01/28/2020 1249   CREATININE 1.61 (H) 01/21/2016 1030      Component Value Date/Time   CALCIUM 8.9 01/28/2020 1249   CALCIUM 7.0 (L) 10/09/2015 0458   ALKPHOS 109 01/28/2020 1249   AST 27 01/28/2020 1249   ALT 25 01/28/2020 1249   BILITOT 0.3 01/28/2020 1249        Impression and Plan:  74 year old man with:  1.  Rectal cancer presented with pelvic adenopathy diagnosed in March 2021.  He was found to have T3N2 disease.  He completed neoadjuvant chemotherapy and currently receiving definitive therapy with chemotherapy and radiation.  Risks and benefits of continuing oral Xeloda were discussed.  Complications include hand-foot syndrome, nausea, diarrhea and fatigue were reviewed.  He is agreeable to continue at this time.  The plan is to complete this phase of treatment and consider surgical resection if he has any residual disease.  2.  IV access: Port-A-Cath continues to be in use without any issues.  3.  Antiemetics: No nausea or vomiting reported.  Compazine is available to him.  4.  Neuropathy: No residual neuropathy related to oxaliplatin.  5.  Neutropenia: Related to chemotherapy and will continue to monitor on Xeloda.  White cell count is back to normal at this time.  6.  Thrombocytopenia: I anticipate improvement off oxaliplatin chemotherapy.  We will continue to monitor on Xeloda.  Platelet count from today continue to be stable.   7.  Follow-up: We will continue to follow daily for radiation therapy and will reevaluate in the next 2 to 3 weeks.    30  minutes were spent on this encounter.  Time was dedicated to reviewing his disease status, discussing treatment options and addressing complication related to his cancer and cancer therapy.    Jared Button,  MD 10/6/20218:35 AM

## 2020-02-27 ENCOUNTER — Ambulatory Visit
Admission: RE | Admit: 2020-02-27 | Discharge: 2020-02-27 | Disposition: A | Discharge status: Home / Self Care | Payer: Medicare Other | Source: Ambulatory Visit | Attending: Radiation Oncology | Admitting: Radiation Oncology

## 2020-02-27 DIAGNOSIS — C20 Malignant neoplasm of rectum: Secondary | ICD-10-CM | POA: Diagnosis not present

## 2020-02-27 DIAGNOSIS — Z510 Encounter for antineoplastic radiation therapy: Secondary | ICD-10-CM | POA: Diagnosis not present

## 2020-02-28 ENCOUNTER — Ambulatory Visit
Admission: RE | Admit: 2020-02-28 | Discharge: 2020-02-28 | Disposition: A | Discharge status: Home / Self Care | Payer: Medicare Other | Source: Ambulatory Visit | Attending: Radiation Oncology | Admitting: Radiation Oncology

## 2020-02-28 DIAGNOSIS — Z510 Encounter for antineoplastic radiation therapy: Secondary | ICD-10-CM | POA: Diagnosis not present

## 2020-02-28 DIAGNOSIS — C20 Malignant neoplasm of rectum: Secondary | ICD-10-CM | POA: Diagnosis not present

## 2020-03-02 ENCOUNTER — Ambulatory Visit
Admission: RE | Admit: 2020-03-02 | Discharge: 2020-03-02 | Disposition: A | Discharge status: Home / Self Care | Payer: Medicare Other | Source: Ambulatory Visit | Attending: Radiation Oncology | Admitting: Radiation Oncology

## 2020-03-02 DIAGNOSIS — Z510 Encounter for antineoplastic radiation therapy: Secondary | ICD-10-CM | POA: Diagnosis not present

## 2020-03-02 DIAGNOSIS — C20 Malignant neoplasm of rectum: Secondary | ICD-10-CM | POA: Diagnosis not present

## 2020-03-03 ENCOUNTER — Ambulatory Visit
Admission: RE | Admit: 2020-03-03 | Discharge: 2020-03-03 | Disposition: A | Discharge status: Home / Self Care | Payer: Medicare Other | Source: Ambulatory Visit | Attending: Radiation Oncology | Admitting: Radiation Oncology

## 2020-03-03 DIAGNOSIS — C20 Malignant neoplasm of rectum: Secondary | ICD-10-CM | POA: Diagnosis not present

## 2020-03-03 DIAGNOSIS — Z510 Encounter for antineoplastic radiation therapy: Secondary | ICD-10-CM | POA: Diagnosis not present

## 2020-03-04 ENCOUNTER — Inpatient Hospital Stay: Payer: Medicare Other

## 2020-03-04 ENCOUNTER — Other Ambulatory Visit: Payer: Self-pay

## 2020-03-04 ENCOUNTER — Ambulatory Visit
Admission: RE | Admit: 2020-03-04 | Discharge: 2020-03-04 | Disposition: A | Discharge status: Home / Self Care | Payer: Medicare Other | Source: Ambulatory Visit | Attending: Radiation Oncology | Admitting: Radiation Oncology

## 2020-03-04 ENCOUNTER — Telehealth: Payer: Self-pay | Admitting: Oncology

## 2020-03-04 VITALS — BP 145/79 | HR 98 | Resp 18

## 2020-03-04 DIAGNOSIS — I2510 Atherosclerotic heart disease of native coronary artery without angina pectoris: Secondary | ICD-10-CM | POA: Diagnosis not present

## 2020-03-04 DIAGNOSIS — I10 Essential (primary) hypertension: Secondary | ICD-10-CM | POA: Diagnosis not present

## 2020-03-04 DIAGNOSIS — C20 Malignant neoplasm of rectum: Secondary | ICD-10-CM | POA: Diagnosis not present

## 2020-03-04 DIAGNOSIS — K625 Hemorrhage of anus and rectum: Secondary | ICD-10-CM | POA: Diagnosis not present

## 2020-03-04 DIAGNOSIS — R197 Diarrhea, unspecified: Secondary | ICD-10-CM | POA: Diagnosis not present

## 2020-03-04 DIAGNOSIS — D696 Thrombocytopenia, unspecified: Secondary | ICD-10-CM | POA: Diagnosis not present

## 2020-03-04 DIAGNOSIS — G629 Polyneuropathy, unspecified: Secondary | ICD-10-CM | POA: Diagnosis not present

## 2020-03-04 DIAGNOSIS — K219 Gastro-esophageal reflux disease without esophagitis: Secondary | ICD-10-CM | POA: Diagnosis not present

## 2020-03-04 DIAGNOSIS — Z923 Personal history of irradiation: Secondary | ICD-10-CM | POA: Diagnosis not present

## 2020-03-04 DIAGNOSIS — Z841 Family history of disorders of kidney and ureter: Secondary | ICD-10-CM | POA: Diagnosis not present

## 2020-03-04 DIAGNOSIS — Z510 Encounter for antineoplastic radiation therapy: Secondary | ICD-10-CM | POA: Diagnosis not present

## 2020-03-04 DIAGNOSIS — Z8249 Family history of ischemic heart disease and other diseases of the circulatory system: Secondary | ICD-10-CM | POA: Diagnosis not present

## 2020-03-04 DIAGNOSIS — D709 Neutropenia, unspecified: Secondary | ICD-10-CM | POA: Diagnosis not present

## 2020-03-04 DIAGNOSIS — Z79899 Other long term (current) drug therapy: Secondary | ICD-10-CM | POA: Diagnosis not present

## 2020-03-04 DIAGNOSIS — Z95828 Presence of other vascular implants and grafts: Secondary | ICD-10-CM

## 2020-03-04 DIAGNOSIS — T282XXA Burn of other parts of alimentary tract, initial encounter: Secondary | ICD-10-CM | POA: Diagnosis not present

## 2020-03-04 LAB — CBC WITH DIFFERENTIAL (CANCER CENTER ONLY)
Abs Immature Granulocytes: 0.01 10*3/uL (ref 0.00–0.07)
Basophils Absolute: 0 10*3/uL (ref 0.0–0.1)
Basophils Relative: 1 %
Eosinophils Absolute: 0.2 10*3/uL (ref 0.0–0.5)
Eosinophils Relative: 6 %
HCT: 38.5 % — ABNORMAL LOW (ref 39.0–52.0)
Hemoglobin: 12.8 g/dL — ABNORMAL LOW (ref 13.0–17.0)
Immature Granulocytes: 0 %
Lymphocytes Relative: 17 %
Lymphs Abs: 0.7 10*3/uL (ref 0.7–4.0)
MCH: 33.5 pg (ref 26.0–34.0)
MCHC: 33.2 g/dL (ref 30.0–36.0)
MCV: 100.8 fL — ABNORMAL HIGH (ref 80.0–100.0)
Monocytes Absolute: 0.4 10*3/uL (ref 0.1–1.0)
Monocytes Relative: 9 %
Neutro Abs: 2.6 10*3/uL (ref 1.7–7.7)
Neutrophils Relative %: 67 %
Platelet Count: 81 10*3/uL — ABNORMAL LOW (ref 150–400)
RBC: 3.82 MIL/uL — ABNORMAL LOW (ref 4.22–5.81)
RDW: 13.9 % (ref 11.5–15.5)
WBC Count: 3.8 10*3/uL — ABNORMAL LOW (ref 4.0–10.5)
nRBC: 0 % (ref 0.0–0.2)

## 2020-03-04 LAB — CMP (CANCER CENTER ONLY)
ALT: 28 U/L (ref 0–44)
AST: 32 U/L (ref 15–41)
Albumin: 3.6 g/dL (ref 3.5–5.0)
Alkaline Phosphatase: 89 U/L (ref 38–126)
Anion gap: 7 (ref 5–15)
BUN: 23 mg/dL (ref 8–23)
CO2: 25 mmol/L (ref 22–32)
Calcium: 9.4 mg/dL (ref 8.9–10.3)
Chloride: 107 mmol/L (ref 98–111)
Creatinine: 1.46 mg/dL — ABNORMAL HIGH (ref 0.61–1.24)
GFR, Estimated: 47 mL/min — ABNORMAL LOW (ref >60)
Glucose, Bld: 170 mg/dL — ABNORMAL HIGH (ref 70–99)
Potassium: 3.6 mmol/L (ref 3.5–5.1)
Sodium: 139 mmol/L (ref 135–145)
Total Bilirubin: 0.4 mg/dL (ref 0.3–1.2)
Total Protein: 7.2 g/dL (ref 6.5–8.1)

## 2020-03-04 MED ORDER — SODIUM CHLORIDE 0.9% FLUSH
10.0000 mL | Freq: Once | INTRAVENOUS | Status: AC
  Administered 2020-03-04: 10 mL
  Filled 2020-03-04: qty 10

## 2020-03-04 MED ORDER — HEPARIN SOD (PORK) LOCK FLUSH 100 UNIT/ML IV SOLN
500.0000 [IU] | Freq: Once | INTRAVENOUS | Status: AC
  Administered 2020-03-04: 500 [IU]
  Filled 2020-03-04: qty 5

## 2020-03-04 NOTE — Patient Instructions (Signed)

## 2020-03-04 NOTE — Telephone Encounter (Signed)
Scheduled per 10/06 los, patient has been called and notified.

## 2020-03-05 ENCOUNTER — Ambulatory Visit
Admission: RE | Admit: 2020-03-05 | Discharge: 2020-03-05 | Disposition: A | Discharge status: Home / Self Care | Payer: Medicare Other | Source: Ambulatory Visit | Attending: Radiation Oncology | Admitting: Radiation Oncology

## 2020-03-05 DIAGNOSIS — Z510 Encounter for antineoplastic radiation therapy: Secondary | ICD-10-CM | POA: Diagnosis not present

## 2020-03-05 DIAGNOSIS — C20 Malignant neoplasm of rectum: Secondary | ICD-10-CM | POA: Diagnosis not present

## 2020-03-06 ENCOUNTER — Ambulatory Visit
Admission: RE | Admit: 2020-03-06 | Discharge: 2020-03-06 | Disposition: A | Discharge status: Home / Self Care | Payer: Medicare Other | Source: Ambulatory Visit | Attending: Radiation Oncology | Admitting: Radiation Oncology

## 2020-03-06 DIAGNOSIS — C20 Malignant neoplasm of rectum: Secondary | ICD-10-CM | POA: Diagnosis not present

## 2020-03-06 DIAGNOSIS — Z510 Encounter for antineoplastic radiation therapy: Secondary | ICD-10-CM | POA: Diagnosis not present

## 2020-03-06 MED FILL — CAPECITABINE 500 MG TABLET: 500 | 14 days supply | Qty: 40 | Fill #1

## 2020-03-09 ENCOUNTER — Telehealth: Payer: Self-pay

## 2020-03-09 ENCOUNTER — Other Ambulatory Visit: Payer: Self-pay

## 2020-03-09 ENCOUNTER — Inpatient Hospital Stay (HOSPITAL_BASED_OUTPATIENT_CLINIC_OR_DEPARTMENT_OTHER): Payer: Medicare Other | Admitting: Medical

## 2020-03-09 ENCOUNTER — Ambulatory Visit
Admission: RE | Admit: 2020-03-09 | Discharge: 2020-03-09 | Disposition: A | Discharge status: Home / Self Care | Payer: Medicare Other | Source: Ambulatory Visit | Attending: Radiation Oncology | Admitting: Radiation Oncology

## 2020-03-09 VITALS — BP 123/63 | HR 68 | Temp 97.8°F | Resp 20 | Ht 68.0 in | Wt 154.0 lb

## 2020-03-09 DIAGNOSIS — Z841 Family history of disorders of kidney and ureter: Secondary | ICD-10-CM | POA: Diagnosis not present

## 2020-03-09 DIAGNOSIS — R197 Diarrhea, unspecified: Secondary | ICD-10-CM | POA: Diagnosis not present

## 2020-03-09 DIAGNOSIS — K625 Hemorrhage of anus and rectum: Secondary | ICD-10-CM

## 2020-03-09 DIAGNOSIS — C20 Malignant neoplasm of rectum: Secondary | ICD-10-CM | POA: Diagnosis not present

## 2020-03-09 DIAGNOSIS — T300 Burn of unspecified body region, unspecified degree: Secondary | ICD-10-CM

## 2020-03-09 DIAGNOSIS — K591 Functional diarrhea: Secondary | ICD-10-CM

## 2020-03-09 DIAGNOSIS — Z510 Encounter for antineoplastic radiation therapy: Secondary | ICD-10-CM | POA: Diagnosis not present

## 2020-03-09 DIAGNOSIS — T282XXA Burn of other parts of alimentary tract, initial encounter: Secondary | ICD-10-CM | POA: Diagnosis not present

## 2020-03-09 DIAGNOSIS — Z923 Personal history of irradiation: Secondary | ICD-10-CM | POA: Diagnosis not present

## 2020-03-09 DIAGNOSIS — G629 Polyneuropathy, unspecified: Secondary | ICD-10-CM | POA: Diagnosis not present

## 2020-03-09 DIAGNOSIS — Z8249 Family history of ischemic heart disease and other diseases of the circulatory system: Secondary | ICD-10-CM | POA: Diagnosis not present

## 2020-03-09 DIAGNOSIS — D709 Neutropenia, unspecified: Secondary | ICD-10-CM | POA: Diagnosis not present

## 2020-03-09 DIAGNOSIS — I10 Essential (primary) hypertension: Secondary | ICD-10-CM | POA: Diagnosis not present

## 2020-03-09 DIAGNOSIS — D696 Thrombocytopenia, unspecified: Secondary | ICD-10-CM | POA: Diagnosis not present

## 2020-03-09 DIAGNOSIS — I2510 Atherosclerotic heart disease of native coronary artery without angina pectoris: Secondary | ICD-10-CM | POA: Diagnosis not present

## 2020-03-09 DIAGNOSIS — K219 Gastro-esophageal reflux disease without esophagitis: Secondary | ICD-10-CM | POA: Diagnosis not present

## 2020-03-09 DIAGNOSIS — Z79899 Other long term (current) drug therapy: Secondary | ICD-10-CM | POA: Diagnosis not present

## 2020-03-09 LAB — CMP (CANCER CENTER ONLY)
ALT: 29 U/L (ref 0–44)
AST: 32 U/L (ref 15–41)
Albumin: 3.7 g/dL (ref 3.5–5.0)
Alkaline Phosphatase: 89 U/L (ref 38–126)
Anion gap: 6 (ref 5–15)
BUN: 28 mg/dL — ABNORMAL HIGH (ref 8–23)
CO2: 26 mmol/L (ref 22–32)
Calcium: 10 mg/dL (ref 8.9–10.3)
Chloride: 107 mmol/L (ref 98–111)
Creatinine: 1.72 mg/dL — ABNORMAL HIGH (ref 0.61–1.24)
GFR, Estimated: 38 mL/min — ABNORMAL LOW (ref >60)
Glucose, Bld: 120 mg/dL — ABNORMAL HIGH (ref 70–99)
Potassium: 4.2 mmol/L (ref 3.5–5.1)
Sodium: 139 mmol/L (ref 135–145)
Total Bilirubin: 0.6 mg/dL (ref 0.3–1.2)
Total Protein: 7.6 g/dL (ref 6.5–8.1)

## 2020-03-09 LAB — CBC WITH DIFFERENTIAL (CANCER CENTER ONLY)
Abs Immature Granulocytes: 0.02 10*3/uL (ref 0.00–0.07)
Basophils Absolute: 0 10*3/uL (ref 0.0–0.1)
Basophils Relative: 1 %
Eosinophils Absolute: 0.2 10*3/uL (ref 0.0–0.5)
Eosinophils Relative: 3 %
HCT: 42.2 % (ref 39.0–52.0)
Hemoglobin: 14 g/dL (ref 13.0–17.0)
Immature Granulocytes: 0 %
Lymphocytes Relative: 9 %
Lymphs Abs: 0.6 10*3/uL — ABNORMAL LOW (ref 0.7–4.0)
MCH: 33.3 pg (ref 26.0–34.0)
MCHC: 33.2 g/dL (ref 30.0–36.0)
MCV: 100.2 fL — ABNORMAL HIGH (ref 80.0–100.0)
Monocytes Absolute: 0.6 10*3/uL (ref 0.1–1.0)
Monocytes Relative: 9 %
Neutro Abs: 5.4 10*3/uL (ref 1.7–7.7)
Neutrophils Relative %: 78 %
Platelet Count: 108 10*3/uL — ABNORMAL LOW (ref 150–400)
RBC: 4.21 MIL/uL — ABNORMAL LOW (ref 4.22–5.81)
RDW: 14.2 % (ref 11.5–15.5)
WBC Count: 6.8 10*3/uL (ref 4.0–10.5)
nRBC: 0 % (ref 0.0–0.2)

## 2020-03-09 LAB — SAMPLE TO BLOOD BANK

## 2020-03-09 LAB — MAGNESIUM: Magnesium: 1.8 mg/dL (ref 1.7–2.4)

## 2020-03-09 MED ORDER — SILVER SULFADIAZINE 1 % EX CREA: 1.0000 "application " | TOPICAL_CREAM | Freq: Every day | CUTANEOUS | 1 refills | Status: DC

## 2020-03-09 NOTE — Telephone Encounter (Signed)
-----   Message from Wyatt Portela, MD sent at 03/09/2020  9:18 AM EDT ----- Please tell him to hold Xeloda for this week. Symptom management today as well. Thanks ----- Message ----- From: Tami Lin, RN Sent: 03/09/2020   9:13 AM EDT To: Wyatt Portela, MD  Patient is here for radiation today but had the receptionist call me. He wants to make you aware that he has had diarrhea x 1 week. He states he has rectal pain and bleeding, no appetite and doesn't feel that good. Patient states he does have quite a bit of blood on the tissue when he wipes and he keeps tissue in his rectal area to absorb the blood.  Lanelle Bal

## 2020-03-09 NOTE — Telephone Encounter (Signed)
Per Dr. Alen Blew patient made aware to hold Xeloda this week. Patient verbalized understanding.

## 2020-03-09 NOTE — Telephone Encounter (Signed)
Spoke to [place] in [facility] to confirm ok to add patient to schedule today. Patient scheduled for 10:00 lab followed by a visit in [facility]. Called [facility] to have them inform patient of added appointments for today. Patient aware and will return to registration in the [place] to check in for appointment.

## 2020-03-09 NOTE — Patient Instructions (Signed)

## 2020-03-10 ENCOUNTER — Ambulatory Visit
Admission: RE | Admit: 2020-03-10 | Discharge: 2020-03-10 | Disposition: A | Discharge status: Home / Self Care | Payer: Medicare Other | Source: Ambulatory Visit | Attending: Radiation Oncology | Admitting: Radiation Oncology

## 2020-03-10 DIAGNOSIS — Z510 Encounter for antineoplastic radiation therapy: Secondary | ICD-10-CM | POA: Diagnosis not present

## 2020-03-10 DIAGNOSIS — C20 Malignant neoplasm of rectum: Secondary | ICD-10-CM | POA: Diagnosis not present

## 2020-03-11 ENCOUNTER — Other Ambulatory Visit: Payer: Self-pay

## 2020-03-11 ENCOUNTER — Inpatient Hospital Stay: Payer: Medicare Other

## 2020-03-11 ENCOUNTER — Ambulatory Visit
Admission: RE | Admit: 2020-03-11 | Discharge: 2020-03-11 | Disposition: A | Discharge status: Home / Self Care | Payer: Medicare Other | Source: Ambulatory Visit | Attending: Radiation Oncology | Admitting: Radiation Oncology

## 2020-03-11 DIAGNOSIS — C20 Malignant neoplasm of rectum: Secondary | ICD-10-CM

## 2020-03-11 DIAGNOSIS — Z841 Family history of disorders of kidney and ureter: Secondary | ICD-10-CM | POA: Diagnosis not present

## 2020-03-11 DIAGNOSIS — Z95828 Presence of other vascular implants and grafts: Secondary | ICD-10-CM

## 2020-03-11 DIAGNOSIS — D709 Neutropenia, unspecified: Secondary | ICD-10-CM | POA: Diagnosis not present

## 2020-03-11 DIAGNOSIS — Z79899 Other long term (current) drug therapy: Secondary | ICD-10-CM | POA: Diagnosis not present

## 2020-03-11 DIAGNOSIS — K219 Gastro-esophageal reflux disease without esophagitis: Secondary | ICD-10-CM | POA: Diagnosis not present

## 2020-03-11 DIAGNOSIS — R197 Diarrhea, unspecified: Secondary | ICD-10-CM | POA: Diagnosis not present

## 2020-03-11 DIAGNOSIS — Z923 Personal history of irradiation: Secondary | ICD-10-CM | POA: Diagnosis not present

## 2020-03-11 DIAGNOSIS — I10 Essential (primary) hypertension: Secondary | ICD-10-CM | POA: Diagnosis not present

## 2020-03-11 DIAGNOSIS — G629 Polyneuropathy, unspecified: Secondary | ICD-10-CM | POA: Diagnosis not present

## 2020-03-11 DIAGNOSIS — K625 Hemorrhage of anus and rectum: Secondary | ICD-10-CM | POA: Diagnosis not present

## 2020-03-11 DIAGNOSIS — Z8249 Family history of ischemic heart disease and other diseases of the circulatory system: Secondary | ICD-10-CM | POA: Diagnosis not present

## 2020-03-11 DIAGNOSIS — Z510 Encounter for antineoplastic radiation therapy: Secondary | ICD-10-CM | POA: Diagnosis not present

## 2020-03-11 DIAGNOSIS — I2510 Atherosclerotic heart disease of native coronary artery without angina pectoris: Secondary | ICD-10-CM | POA: Diagnosis not present

## 2020-03-11 DIAGNOSIS — D696 Thrombocytopenia, unspecified: Secondary | ICD-10-CM | POA: Diagnosis not present

## 2020-03-11 DIAGNOSIS — T282XXA Burn of other parts of alimentary tract, initial encounter: Secondary | ICD-10-CM | POA: Diagnosis not present

## 2020-03-11 LAB — CMP (CANCER CENTER ONLY)
ALT: 31 U/L (ref 0–44)
AST: 33 U/L (ref 15–41)
Albumin: 3.5 g/dL (ref 3.5–5.0)
Alkaline Phosphatase: 89 U/L (ref 38–126)
Anion gap: 9 (ref 5–15)
BUN: 38 mg/dL — ABNORMAL HIGH (ref 8–23)
CO2: 22 mmol/L (ref 22–32)
Calcium: 9.5 mg/dL (ref 8.9–10.3)
Chloride: 109 mmol/L (ref 98–111)
Creatinine: 1.88 mg/dL — ABNORMAL HIGH (ref 0.61–1.24)
GFR, Estimated: 34 mL/min — ABNORMAL LOW (ref >60)
Glucose, Bld: 116 mg/dL — ABNORMAL HIGH (ref 70–99)
Potassium: 3.9 mmol/L (ref 3.5–5.1)
Sodium: 140 mmol/L (ref 135–145)
Total Bilirubin: 0.3 mg/dL (ref 0.3–1.2)
Total Protein: 7.1 g/dL (ref 6.5–8.1)

## 2020-03-11 LAB — CBC WITH DIFFERENTIAL (CANCER CENTER ONLY)
Abs Immature Granulocytes: 0.02 10*3/uL (ref 0.00–0.07)
Basophils Absolute: 0.1 10*3/uL (ref 0.0–0.1)
Basophils Relative: 1 %
Eosinophils Absolute: 0.2 10*3/uL (ref 0.0–0.5)
Eosinophils Relative: 5 %
HCT: 38.4 % — ABNORMAL LOW (ref 39.0–52.0)
Hemoglobin: 12.8 g/dL — ABNORMAL LOW (ref 13.0–17.0)
Immature Granulocytes: 0 %
Lymphocytes Relative: 17 %
Lymphs Abs: 0.8 10*3/uL (ref 0.7–4.0)
MCH: 33.5 pg (ref 26.0–34.0)
MCHC: 33.3 g/dL (ref 30.0–36.0)
MCV: 100.5 fL — ABNORMAL HIGH (ref 80.0–100.0)
Monocytes Absolute: 0.5 10*3/uL (ref 0.1–1.0)
Monocytes Relative: 11 %
Neutro Abs: 3 10*3/uL (ref 1.7–7.7)
Neutrophils Relative %: 66 %
Platelet Count: 108 10*3/uL — ABNORMAL LOW (ref 150–400)
RBC: 3.82 MIL/uL — ABNORMAL LOW (ref 4.22–5.81)
RDW: 14.2 % (ref 11.5–15.5)
WBC Count: 4.6 10*3/uL (ref 4.0–10.5)
nRBC: 0 % (ref 0.0–0.2)

## 2020-03-11 MED ORDER — HEPARIN SOD (PORK) LOCK FLUSH 100 UNIT/ML IV SOLN
250.0000 [IU] | Freq: Once | INTRAVENOUS | Status: AC
  Administered 2020-03-11: 500 [IU]
  Filled 2020-03-11: qty 5

## 2020-03-11 MED ORDER — SODIUM CHLORIDE 0.9% FLUSH
10.0000 mL | Freq: Once | INTRAVENOUS | Status: AC
  Administered 2020-03-11: 10 mL
  Filled 2020-03-11: qty 10

## 2020-03-11 NOTE — Patient Instructions (Signed)

## 2020-03-11 NOTE — Progress Notes (Signed)
Symptoms Management Clinic Progress Note   Jared White [id number] 1946/04/12 74 y.o.  Jared White is managed by Dr. Alen Blew  Actively treated with chemotherapy/immunotherapy/hormonal therapy: yes  Current therapy: Concurrent Xeloda and radiation therapy  Next scheduled appointment with provider: 03/19/2020  Assessment: Plan:    Functional diarrhea  Rectal cancer ([place])  Bright red blood per rectum  Ionizing radiation burn   Diarrhea: No intervention at this time given the fact that the patient is only able to pass liquid stools.  Xeloda will be held this week.  Rectal cancer: Jared White continues with radiation therapy.  His Xeloda will be held this week however given that he has had severe diarrhea for 1 week.  Bright red blood per rectum: This is likely related to his radiation therapy and skin breakdown.  A CBC returned today with a hemoglobin of 14 and with platelets of 109.  Patient was reassured.  He is scheduled to see Dr. Alen Blew in follow-up on 03/19/2020 but will return sooner if needed.  Ionizing radiation burn to the rectal area: The patient was given a prescription for Silvadene cream.  Please see After Visit Summary for patient specific instructions.  Future Appointments  Date Time Provider [place]  03/12/2020  9:30 AM [facility] None  03/13/2020  9:30 AM [facility] None  03/16/2020  9:30 AM [facility] None  03/17/2020  9:30 AM [facility] None  03/18/2020  9:30 AM [facility] None  03/19/2020  7:45 AM [facility] LAB [facility] None  03/19/2020  8:00 AM [facility] [place] FLUSH [facility] None  03/19/2020  8:45 AM Shad[place], Mathis Dad, MD [facility] None  03/19/2020  9:30 AM [facility] None  03/19/2020  1:30 PM Patwardhan, Manish J, MD [facility] None  03/20/2020  9:30 AM [facility] None  03/23/2020  9:30 AM  [facility] None  03/24/2020  9:30 AM [facility] None  03/25/2020  9:30 AM [facility] None    No orders of the defined types were placed in this encounter.      Subjective:   Patient ID:  Jared White is a 74 y.o. (DOB 1946/01/21) male.  Chief Complaint:  Chief Complaint  Patient presents with  . Rectal Bleeding  . Diarrhea    HPI Jared White  is a 74 y.o. male with a diagnosis of rectal cancer and is treated by Dr. Alen Blew and Dr. Lisbeth Renshaw.  He is currently on oral Xeloda and concurrent radiation therapy. He presents to the office today with report of diarrhea for 1 week and bright red blood per rectum for the past 3 days. He reports that he is only able to pass liquid stools now due to a "blockage" in his treatment area. He reports that he has had skin breakdown around his rectum due to radiation therapy. He denies nausea, vomiting, chills, fever, chest pain, shortness of breath, dizziness, or weakness.   Medications: I have reviewed the patient's current medications.  Allergies: No Known Allergies  Past Medical History:  Diagnosis Date  . Broken back   . CAD (coronary artery disease)    PCI & CABG  . CHF (congestive heart failure) ([place])   . Diverticulitis    mild - approx 2004  . Family history of heart disease   . GERD (gastroesophageal reflux disease)   . History of nuclear stress test 06/30/2011  lexiscan; normal pattern of perfusion post-stress; low risk scan   . Hyperlipidemia   . Hypertension   . IgA nephropathy   . Irregular heart beat   . Rectal cancer ([place]) 08/19/2019  . Systemic hypertension     Past Surgical History:  Procedure Laterality Date  . CARDIAC CATHETERIZATION  08/1995   PTCA of OM (Dr. Marella Chimes)  . CORONARY ARTERY BYPASS GRAFT  01/2000   x5 - LIMA to LAD, SVG to diagonal, sequential SVG to ramus & OM, SVG to PDA (Dr. Tharon Aquas Trigt)  . IR IMAGING GUIDED PORT INSERTION  09/04/2019    . LUNG LOBECTOMY     left upper lobe  . TRANSTHORACIC ECHOCARDIOGRAM  12/24/2012   EF 55-60%, mild LVH, mild conc hypertrophy; mild MR; LA mildly dilated    Family History  Problem Relation Age of Onset  . Heart attack Father   . Kidney failure Brother   . Prostate cancer Brother   . Heart attack Brother   . Heart disease Brother   . Hypertension Brother   . Heart disease Brother   . Hypertension Brother   . Colon cancer Neg Hx   . Esophageal cancer Neg Hx   . Rectal cancer Neg Hx   . Stomach cancer Neg Hx   . Inflammatory bowel disease Neg Hx   . Liver disease Neg Hx   . Pancreatic cancer Neg Hx     Social History   Socioeconomic History  . Marital status: Single    Spouse name: n/a  . Number of children: 0  . Years of education: Not on file  . Highest education level: Not on file  Occupational History  . Occupation: Chief Strategy Officer, Holiday representative  Tobacco Use  . Smoking status: Never Smoker  . Smokeless tobacco: Never Used  Vaping Use  . Vaping Use: Never used  Substance and Sexual Activity  . Alcohol use: Not Currently  . Drug use: No  . Sexual activity: Not on file  Other Topics Concern  . Not on file  Social History Narrative   Lives alone.   Sister-in-Law lives next door.   Social Determinants of Health   Financial Resource Strain: Low Risk   . Difficulty of Paying Living Expenses: Not hard at all  Food Insecurity: No Food Insecurity  . Worried About Charity fundraiser in the Last Year: Never true  . Ran Out of Food in the Last Year: Never true  Transportation Needs: No Transportation Needs  . Lack of Transportation (Medical): No  . Lack of Transportation (Non-Medical): No  Physical Activity:   . Days of Exercise per Week: Not on file  . Minutes of Exercise per Session: Not on file  Stress: No Stress Concern Present  . Feeling of Stress : Only a little  Social Connections: Socially Isolated  . Frequency of Communication with Friends and  Family: More than three times a week  . Frequency of Social Gatherings with Friends and Family: More than three times a week  . Attends Religious Services: Never  . Active Member of Clubs or Organizations: No  . Attends Archivist Meetings: Never  . Marital Status: Never married  Intimate Partner Violence:   . Fear of Current or Ex-Partner: Not on file  . Emotionally Abused: Not on file  . Physically Abused: Not on file  . Sexually Abused: Not on file    Past Medical History, Surgical history, Social history, and Family history were reviewed and updated  as appropriate.   Please see review of systems for further details on the patient's review from today.   Review of Systems:  Review of Systems  Constitutional: Negative for appetite change, chills, diaphoresis and fever.  HENT: Negative for trouble swallowing.   Respiratory: Negative for cough, chest tightness and shortness of breath.   Cardiovascular: Negative for chest pain, palpitations and leg swelling.  Gastrointestinal: Positive for anal bleeding, blood in stool, diarrhea and rectal pain. Negative for abdominal distention, abdominal pain, constipation, nausea and vomiting.  Genitourinary: Negative for decreased urine volume and difficulty urinating.  Neurological: Negative for weakness.    Objective:   Physical Exam:  BP 123/63 (BP Location: Left Arm, Patient Position: Sitting)   Pulse 68   Temp 97.8 F (36.6 C) (Tympanic)   Resp 20   Ht 5\' 8"  (1.727 m)   Wt 154 lb (69.9 kg)   SpO2 96%   BMI 23.42 kg/m  ECOG: 1  Physical Exam Constitutional:      General: He is not in acute distress.    Appearance: Normal appearance. He is not ill-appearing.  HENT:     Head: Normocephalic and atraumatic.  Eyes:     General: No scleral icterus.       Right eye: No discharge.        Left eye: No discharge.     Conjunctiva/sclera: Conjunctivae normal.  Skin:    General: Skin is warm and dry.     Coloration: Skin is  not jaundiced or pale.     Comments: Multiple areas of skin breakdown, erythema, and excoriations were noted in the medial buttocks bilaterally and surrounding the anus.  Neurological:     Mental Status: He is alert.     Coordination: Coordination normal.     Gait: Gait normal.     Lab Review:     Component Value Date/Time   NA 140 03/11/2020 0854   NA 140 12/09/2016 0932   K 3.9 03/11/2020 0854   CL 109 03/11/2020 0854   CO2 22 03/11/2020 0854   GLUCOSE 116 (H) 03/11/2020 0854   BUN 38 (H) 03/11/2020 0854   BUN 29 (H) 12/09/2016 0932   CREATININE 1.88 (H) 03/11/2020 0854   CREATININE 1.61 (H) 01/21/2016 1030   CALCIUM 9.5 03/11/2020 0854   CALCIUM 7.0 (L) 10/09/2015 0458   PROT 7.1 03/11/2020 0854   ALBUMIN 3.5 03/11/2020 0854   AST 33 03/11/2020 0854   ALT 31 03/11/2020 0854   ALKPHOS 89 03/11/2020 0854   BILITOT 0.3 03/11/2020 0854   GFRNONAA 34 (L) 03/11/2020 0854   GFRNONAA 38 (L) 01/09/2016 1105   GFRAA >60 01/28/2020 1249   GFRAA 44 (L) 01/09/2016 1105       Component Value Date/Time   WBC 4.6 03/11/2020 0854   WBC 8.2 09/04/2019 1127   RBC 3.82 (L) 03/11/2020 0854   HGB 12.8 (L) 03/11/2020 0854   HGB 13.7 07/14/2017 1658   HCT 38.4 (L) 03/11/2020 0854   HCT 41.0 07/14/2017 1658   PLT 108 (L) 03/11/2020 0854   PLT 169 07/14/2017 1658   MCV 100.5 (H) 03/11/2020 0854   MCV 92 07/14/2017 1658   MCH 33.5 03/11/2020 0854   MCHC 33.3 03/11/2020 0854   RDW 14.2 03/11/2020 0854   RDW 14.2 07/14/2017 1658   LYMPHSABS 0.8 03/11/2020 0854   MONOABS 0.5 03/11/2020 0854   EOSABS 0.2 03/11/2020 0854   BASOSABS 0.1 03/11/2020 0854   -------------------------------  Imaging from last 24  hours (if applicable):  Radiology interpretation: No results found.      This patient was seen with Dr. Alen Blew with my treatment plan reviewed with him. He expressed agreement with my medical management of this patient.  Oncology addendum  Patient seen and examined  personally.  Overall he is feeling well although he is having more rectal discomfort and diarrhea with blood-tinged stools.  I asked him to withhold Xeloda for the time being.  The symptoms persisted might require GI evaluation and possibly a anoscopy to evaluate his rectal tumor and bleeding.  We will continue to follow him closely will reevaluate next week.  [name] MD 03/13/2020

## 2020-03-12 ENCOUNTER — Ambulatory Visit
Admission: RE | Admit: 2020-03-12 | Discharge: 2020-03-12 | Disposition: A | Discharge status: Home / Self Care | Payer: Medicare Other | Source: Ambulatory Visit | Attending: Radiation Oncology | Admitting: Radiation Oncology

## 2020-03-12 DIAGNOSIS — C20 Malignant neoplasm of rectum: Secondary | ICD-10-CM | POA: Diagnosis not present

## 2020-03-12 DIAGNOSIS — Z510 Encounter for antineoplastic radiation therapy: Secondary | ICD-10-CM | POA: Diagnosis not present

## 2020-03-13 ENCOUNTER — Other Ambulatory Visit: Payer: Self-pay | Admitting: Radiation Oncology

## 2020-03-13 ENCOUNTER — Ambulatory Visit
Admission: RE | Admit: 2020-03-13 | Discharge: 2020-03-13 | Disposition: A | Discharge status: Home / Self Care | Payer: Medicare Other | Source: Ambulatory Visit | Attending: Radiation Oncology | Admitting: Radiation Oncology

## 2020-03-13 DIAGNOSIS — Z510 Encounter for antineoplastic radiation therapy: Secondary | ICD-10-CM | POA: Diagnosis not present

## 2020-03-13 DIAGNOSIS — C20 Malignant neoplasm of rectum: Secondary | ICD-10-CM | POA: Diagnosis not present

## 2020-03-13 MED ORDER — HYDROCORTISONE ACETATE 25 MG RE SUPP: 25.0000 mg | Freq: Two times a day (BID) | RECTAL | 0 refills | Status: DC

## 2020-03-16 ENCOUNTER — Ambulatory Visit
Admission: RE | Admit: 2020-03-16 | Discharge: 2020-03-16 | Disposition: A | Discharge status: Home / Self Care | Payer: Medicare Other | Source: Ambulatory Visit | Attending: Radiation Oncology | Admitting: Radiation Oncology

## 2020-03-16 DIAGNOSIS — Z510 Encounter for antineoplastic radiation therapy: Secondary | ICD-10-CM | POA: Diagnosis not present

## 2020-03-16 DIAGNOSIS — C20 Malignant neoplasm of rectum: Secondary | ICD-10-CM | POA: Diagnosis not present

## 2020-03-17 ENCOUNTER — Ambulatory Visit
Admission: RE | Admit: 2020-03-17 | Discharge: 2020-03-17 | Disposition: A | Discharge status: Home / Self Care | Payer: Medicare Other | Source: Ambulatory Visit | Attending: Radiation Oncology | Admitting: Radiation Oncology

## 2020-03-17 ENCOUNTER — Other Ambulatory Visit: Payer: Self-pay

## 2020-03-17 DIAGNOSIS — C20 Malignant neoplasm of rectum: Secondary | ICD-10-CM | POA: Diagnosis not present

## 2020-03-17 DIAGNOSIS — Z510 Encounter for antineoplastic radiation therapy: Secondary | ICD-10-CM | POA: Diagnosis not present

## 2020-03-18 ENCOUNTER — Ambulatory Visit
Admission: RE | Admit: 2020-03-18 | Discharge: 2020-03-18 | Disposition: A | Discharge status: Home / Self Care | Payer: Medicare Other | Source: Ambulatory Visit | Attending: Radiation Oncology | Admitting: Radiation Oncology

## 2020-03-18 DIAGNOSIS — C20 Malignant neoplasm of rectum: Secondary | ICD-10-CM | POA: Diagnosis not present

## 2020-03-18 DIAGNOSIS — Z510 Encounter for antineoplastic radiation therapy: Secondary | ICD-10-CM | POA: Diagnosis not present

## 2020-03-19 ENCOUNTER — Inpatient Hospital Stay: Payer: Medicare Other

## 2020-03-19 ENCOUNTER — Telehealth: Payer: Self-pay | Admitting: Oncology

## 2020-03-19 ENCOUNTER — Other Ambulatory Visit: Payer: Self-pay

## 2020-03-19 ENCOUNTER — Inpatient Hospital Stay (HOSPITAL_BASED_OUTPATIENT_CLINIC_OR_DEPARTMENT_OTHER): Payer: Medicare Other | Admitting: Oncology

## 2020-03-19 ENCOUNTER — Ambulatory Visit: Payer: Medicare Other | Admitting: Cardiology

## 2020-03-19 ENCOUNTER — Encounter: Payer: Self-pay | Admitting: Cardiology

## 2020-03-19 ENCOUNTER — Ambulatory Visit
Admission: RE | Admit: 2020-03-19 | Discharge: 2020-03-19 | Disposition: A | Discharge status: Home / Self Care | Payer: Medicare Other | Source: Ambulatory Visit | Attending: Radiation Oncology | Admitting: Radiation Oncology

## 2020-03-19 VITALS — BP 137/69 | HR 71 | Resp 16 | Ht 68.0 in | Wt 154.0 lb

## 2020-03-19 VITALS — BP 130/75 | HR 61 | Temp 96.9°F | Resp 18 | Ht 68.0 in | Wt 154.1 lb

## 2020-03-19 DIAGNOSIS — C20 Malignant neoplasm of rectum: Secondary | ICD-10-CM

## 2020-03-19 DIAGNOSIS — T282XXA Burn of other parts of alimentary tract, initial encounter: Secondary | ICD-10-CM | POA: Diagnosis not present

## 2020-03-19 DIAGNOSIS — D709 Neutropenia, unspecified: Secondary | ICD-10-CM | POA: Diagnosis not present

## 2020-03-19 DIAGNOSIS — Z923 Personal history of irradiation: Secondary | ICD-10-CM | POA: Diagnosis not present

## 2020-03-19 DIAGNOSIS — K625 Hemorrhage of anus and rectum: Secondary | ICD-10-CM | POA: Diagnosis not present

## 2020-03-19 DIAGNOSIS — I10 Essential (primary) hypertension: Secondary | ICD-10-CM

## 2020-03-19 DIAGNOSIS — Z79899 Other long term (current) drug therapy: Secondary | ICD-10-CM | POA: Diagnosis not present

## 2020-03-19 DIAGNOSIS — D696 Thrombocytopenia, unspecified: Secondary | ICD-10-CM | POA: Diagnosis not present

## 2020-03-19 DIAGNOSIS — Z01810 Encounter for preprocedural cardiovascular examination: Secondary | ICD-10-CM | POA: Diagnosis not present

## 2020-03-19 DIAGNOSIS — Z841 Family history of disorders of kidney and ureter: Secondary | ICD-10-CM | POA: Diagnosis not present

## 2020-03-19 DIAGNOSIS — Z95828 Presence of other vascular implants and grafts: Secondary | ICD-10-CM

## 2020-03-19 DIAGNOSIS — Z8249 Family history of ischemic heart disease and other diseases of the circulatory system: Secondary | ICD-10-CM | POA: Diagnosis not present

## 2020-03-19 DIAGNOSIS — R197 Diarrhea, unspecified: Secondary | ICD-10-CM | POA: Diagnosis not present

## 2020-03-19 DIAGNOSIS — G629 Polyneuropathy, unspecified: Secondary | ICD-10-CM | POA: Diagnosis not present

## 2020-03-19 DIAGNOSIS — K219 Gastro-esophageal reflux disease without esophagitis: Secondary | ICD-10-CM | POA: Diagnosis not present

## 2020-03-19 DIAGNOSIS — I2510 Atherosclerotic heart disease of native coronary artery without angina pectoris: Secondary | ICD-10-CM

## 2020-03-19 DIAGNOSIS — Z510 Encounter for antineoplastic radiation therapy: Secondary | ICD-10-CM | POA: Diagnosis not present

## 2020-03-19 LAB — CMP (CANCER CENTER ONLY)
ALT: 30 U/L (ref 0–44)
AST: 29 U/L (ref 15–41)
Albumin: 3.5 g/dL (ref 3.5–5.0)
Alkaline Phosphatase: 81 U/L (ref 38–126)
Anion gap: 8 (ref 5–15)
BUN: 20 mg/dL (ref 8–23)
CO2: 22 mmol/L (ref 22–32)
Calcium: 9.2 mg/dL (ref 8.9–10.3)
Chloride: 110 mmol/L (ref 98–111)
Creatinine: 1.44 mg/dL — ABNORMAL HIGH (ref 0.61–1.24)
GFR, Estimated: 51 mL/min — ABNORMAL LOW (ref >60)
Glucose, Bld: 115 mg/dL — ABNORMAL HIGH (ref 70–99)
Potassium: 3.8 mmol/L (ref 3.5–5.1)
Sodium: 140 mmol/L (ref 135–145)
Total Bilirubin: 0.5 mg/dL (ref 0.3–1.2)
Total Protein: 6.7 g/dL (ref 6.5–8.1)

## 2020-03-19 LAB — CBC WITH DIFFERENTIAL (CANCER CENTER ONLY)
Abs Immature Granulocytes: 0.01 10*3/uL (ref 0.00–0.07)
Basophils Absolute: 0 10*3/uL (ref 0.0–0.1)
Basophils Relative: 1 %
Eosinophils Absolute: 0.2 10*3/uL (ref 0.0–0.5)
Eosinophils Relative: 3 %
HCT: 36.8 % — ABNORMAL LOW (ref 39.0–52.0)
Hemoglobin: 12.4 g/dL — ABNORMAL LOW (ref 13.0–17.0)
Immature Granulocytes: 0 %
Lymphocytes Relative: 12 %
Lymphs Abs: 0.6 10*3/uL — ABNORMAL LOW (ref 0.7–4.0)
MCH: 33.4 pg (ref 26.0–34.0)
MCHC: 33.7 g/dL (ref 30.0–36.0)
MCV: 99.2 fL (ref 80.0–100.0)
Monocytes Absolute: 0.5 10*3/uL (ref 0.1–1.0)
Monocytes Relative: 11 %
Neutro Abs: 3.7 10*3/uL (ref 1.7–7.7)
Neutrophils Relative %: 73 %
Platelet Count: 121 10*3/uL — ABNORMAL LOW (ref 150–400)
RBC: 3.71 MIL/uL — ABNORMAL LOW (ref 4.22–5.81)
RDW: 13.9 % (ref 11.5–15.5)
WBC Count: 5 10*3/uL (ref 4.0–10.5)
nRBC: 0 % (ref 0.0–0.2)

## 2020-03-19 MED ORDER — SODIUM CHLORIDE 0.9% FLUSH
10.0000 mL | Freq: Once | INTRAVENOUS | Status: AC
  Administered 2020-03-19: 10 mL
  Filled 2020-03-19: qty 10

## 2020-03-19 MED ORDER — HEPARIN SOD (PORK) LOCK FLUSH 100 UNIT/ML IV SOLN
250.0000 [IU] | Freq: Once | INTRAVENOUS | Status: AC
  Administered 2020-03-19: 500 [IU]
  Filled 2020-03-19: qty 5

## 2020-03-19 NOTE — Progress Notes (Signed)
Subjective:   Jared White, male    DOB: 19-Aug-1945, 74 y.o.   MRN: [id number]  Chief complaint:  Coronary artery disease    HPI  74 year old male with coronary artery disease status post CABG 2001 vy Dr. Lucianne Lei Trigt (LIMA-LAD, SVG-Diag, seg SVG-ramus & OM, SVG-PDA), hypertension, CKD III w/IgA nephropathy, hyperlipidemia.  Patient was diagnosed with invasive rectal adenocarcinoma in March 2021, started on neoadjuvant therapy utilizing FOLFOX in April 2021.He completed 7 cycles in 12/2019. Radiation therapy and Xeloda were started in 01/2020, Xaledo then held due to diarrhea. He saw Dr. Alen Blew today.  His physical activity has been limited during his cancer treatment. He has had some exertional dyspnea, denies chest pain. Blood pressure is well controlled.   He is likely to undergo surgery for his cancer in Nov 2021.    Current Outpatient Medications on File Prior to Visit  Medication Sig Dispense Refill  . aspirin EC 81 MG tablet Take 1 tablet (81 mg total) by mouth daily. 90 tablet 3  . atenolol (TENORMIN) 50 MG tablet TAKE 1 TABLET BY MOUTH EVERY DAY 90 tablet 0  . calcium carbonate (OSCAL) 1500 (600 Ca) MG TABS tablet Take by mouth daily.    . capecitabine (XELODA) 500 MG tablet Take 2 tablets twice a day, Monday through Friday with radiation. 120 tablet 0  . famotidine (PEPCID) 20 MG tablet Take 1 tablet (20 mg total) by mouth 2 (two) times daily. 60 tablet 5  . fluticasone (FLONASE) 50 MCG/ACT nasal spray INSTILL 2 SPRAYS INTO EACH NOSTRIL EVERY DAY 48 g 2  . hydrocortisone (ANUSOL-HC) 25 MG suppository Place 1 suppository (25 mg total) rectally 2 (two) times daily. 12 suppository 0  . Magnesium Oxide 400 MG CAPS Take 1 capsule (400 mg total) by mouth 2 (two) times daily. 60 capsule 0  . nitroGLYCERIN (NITROSTAT) 0.4 MG SL tablet SMARTSIG:2 Tablet(s) Sublingual PRN    . Omega-3 Fatty Acids (OMEGA 3 PO) Take 520 mg by mouth daily.     Marland Kitchen omeprazole (PRILOSEC) 40 MG capsule TAKE 1  CAPSULE BY MOUTH TWICE A DAY 180 capsule 1  . prochlorperazine (COMPAZINE) 10 MG tablet Take 1 tablet (10 mg total) by mouth every 6 (six) hours as needed for nausea or vomiting. 30 tablet 0  . rosuvastatin (CRESTOR) 20 MG tablet TAKE 1 TABLET BY MOUTH EVERY DAY 90 tablet 2  . silver sulfADIAZINE (SILVADENE) 1 % cream Apply 1 application topically daily. 400 g 1  . valsartan (DIOVAN) 160 MG tablet TAKE 1 TABLET BY MOUTH EVERY DAY 90 tablet 0  . vitamin B-12 (CYANOCOBALAMIN) 1000 MCG tablet Take 1,000 mcg by mouth daily.     No current facility-administered medications on file prior to visit.    Cardiovascular studies:  EKG 03/19/2020: Sinus rhythm 67 bpm Normal EKG  Echocardiogram 2014: - Left ventricle: The cavity size was normal. Wall thickness  was increased in a pattern of mild LVH. There was mild  concentric hypertrophy. Systolic function was normal. The  estimated ejection fraction was in the range of 55% to  60%. Wall motion was normal; there were no regional wall  motion abnormalities. Left ventricular diastolic function  parameters were normal.  - Ventricular septum: Thickness was mildly increased.  - Mitral valve: Mild regurgitation.  - Left atrium: The atrium was mildly dilated.  - Atrial septum: No defect or patent foramen ovale was  identified.    Recent labs: 03/19/2020: Glucose 115, BUN/Cr 20/1.44. EGFR 51. Na/K  140/3.8. Rest of the CMP normal H/H 12/36. MCV 99. Platelets 121  07/14/2017: H/H 13.7/41.  MCV 92.  Platelets 169 Glucose 119, BUN/creatinine 28/1.81.  EGFR 37.  Sodium 140, potassium 4.7  Review of Systems  Cardiovascular: Negative for chest pain, dyspnea on exertion, leg swelling, palpitations and syncope.       Vitals:   03/19/20 1300  BP: 137/69  Pulse: 71  Resp: 16  SpO2: 97%    Physical Exam: Physical Exam Vitals and nursing note reviewed.  Constitutional:      General: He is not in acute distress. Neck:      Vascular: No JVD.  Cardiovascular:     Rate and Rhythm: Normal rate and regular rhythm.     Heart sounds: Normal heart sounds. No murmur heard.   Pulmonary:     Effort: Pulmonary effort is normal.     Breath sounds: Normal breath sounds. No wheezing or rales.           Assessment & Recommendations:  74 year old male with coronary artery disease status post CABG 2001 vy Dr. Lucianne Lei Trigt (LIMA-LAD, SVG-Diag, seg SVG-ramus & OM, SVG-PDA), hypertension, CKD III w/IgA nephropathy, hyperlipidemia, invasive adenocarcinoma on chemoradiation  CAD: Stable. Recommend aspirin 81 mg, Crestor 20 mg. Given his low functional capacity and possible upcoming rectal cancer surgery, recommend echocardiogram and exercise/lexiscan nuclear stress test for pre-op risk stratification.  Hypertension: Controlled.  CKD 3: F/u w/nephrology.  Follow up in 1 year.  Nigel Mormon, MD [facility] Pager: [phone] Office: [phone] If no answer Cell [phone]

## 2020-03-19 NOTE — Telephone Encounter (Signed)
Scheduled appointments per 10/28 los. Spoke to patient who is aware of appointment date and time.

## 2020-03-19 NOTE — Patient Instructions (Signed)

## 2020-03-19 NOTE — Progress Notes (Signed)
Hematology and Oncology Follow Up Visit  Jared White [id number] September 07, 1945 74 y.o. 03/19/2020 8:40 AM Jared White, MDGreene, [name], MD   Principle Diagnosis: 74 year old man with T3N2 rectal cancer diagnosed in March 2021.     Prior Therapy:   He is status post colonoscopy and endoscopy completed on August 13, 2019.  Rectal biopsy showed an invasive adenocarcinoma.  Neoadjuvant chemotherapy utilizing FOLFOX started on September 05 2019.   He completed 7 cycles of therapy in August 2021.  Current therapy: Radiation therapy with oral Xeloda started on September 27. Xeloda has been on hold because of worsening diarrhea.   Interim History: Jared White presents today for a repeat evaluation. Since the last visit, he had continues to have issues with diarrhea and irritation but overall symptoms are manageable. He does report difficulty moving his bowels at times and feels that there is a slight blockage in his rectum. I withheld Xeloda for the last 2 weeks given his worsening symptoms of diarrhea which has improved at this time. He is eating well at this time and maintaining his weight. He denies any nausea or vomiting.    Medications: Reviewed without changes. Current Outpatient Medications  Medication Sig Dispense Refill  . aspirin EC 81 MG tablet Take 1 tablet (81 mg total) by mouth daily. 90 tablet 3  . atenolol (TENORMIN) 50 MG tablet TAKE 1 TABLET BY MOUTH EVERY DAY 90 tablet 0  . calcium carbonate (OSCAL) 1500 (600 Ca) MG TABS tablet Take by mouth daily.    . capecitabine (XELODA) 500 MG tablet Take 2 tablets twice a day, Monday through Friday with radiation. 120 tablet 0  . famotidine (PEPCID) 20 MG tablet Take 1 tablet (20 mg total) by mouth 2 (two) times daily. 60 tablet 5  . fluticasone (FLONASE) 50 MCG/ACT nasal spray INSTILL 2 SPRAYS INTO EACH NOSTRIL EVERY DAY 48 g 2  . hydrocortisone (ANUSOL-HC) 25 MG suppository Place 1 suppository (25 mg total) rectally 2 (two) times  daily. 12 suppository 0  . Magnesium Oxide 400 MG CAPS Take 1 capsule (400 mg total) by mouth 2 (two) times daily. 60 capsule 0  . nitroGLYCERIN (NITROSTAT) 0.4 MG SL tablet SMARTSIG:2 Tablet(s) Sublingual PRN    . Omega-3 Fatty Acids (OMEGA 3 PO) Take 520 mg by mouth daily.     Marland Kitchen omeprazole (PRILOSEC) 40 MG capsule TAKE 1 CAPSULE BY MOUTH TWICE A DAY 180 capsule 1  . prochlorperazine (COMPAZINE) 10 MG tablet Take 1 tablet (10 mg total) by mouth every 6 (six) hours as needed for nausea or vomiting. 30 tablet 0  . rosuvastatin (CRESTOR) 20 MG tablet TAKE 1 TABLET BY MOUTH EVERY DAY 90 tablet 2  . silver sulfADIAZINE (SILVADENE) 1 % cream Apply 1 application topically daily. 400 g 1  . valsartan (DIOVAN) 160 MG tablet TAKE 1 TABLET BY MOUTH EVERY DAY 90 tablet 0  . vitamin B-12 (CYANOCOBALAMIN) 1000 MCG tablet Take 1,000 mcg by mouth daily.     No current facility-administered medications for this visit.     Allergies: No Known Allergies    Physical Exam:     Blood pressure 130/75, pulse 61, temperature (!) 96.9 F (36.1 C), temperature source Tympanic, resp. rate 18, height 5\' 8"  (1.727 m), weight 154 lb 1.6 oz (69.9 kg), SpO2 98 %.     ECOG: 1   General appearance: Comfortable appearing without any discomfort Head: Normocephalic without any trauma Oropharynx: Mucous membranes are moist and pink without any thrush  or ulcers. Eyes: Pupils are equal and round reactive to light. Lymph nodes: No cervical, supraclavicular, inguinal or axillary lymphadenopathy.   Heart:regular rate and rhythm.  S1 and S2 without leg edema. Lung: Clear without any rhonchi or wheezes.  No dullness to percussion. Abdomin: Soft, nontender, nondistended with good bowel sounds.  No hepatosplenomegaly. Musculoskeletal: No joint deformity or effusion.  Full range of motion noted. Neurological: No deficits noted on motor, sensory and deep tendon reflex exam. Skin: No petechial rash or dryness.  Appeared  moist.              Lab Results: Lab Results  Component Value Date   WBC 5.0 03/19/2020   HGB 12.4 (L) 03/19/2020   HCT 36.8 (L) 03/19/2020   MCV 99.2 03/19/2020   PLT 121 (L) 03/19/2020     Chemistry      Component Value Date/Time   NA 140 03/11/2020 0854   NA 140 12/09/2016 0932   K 3.9 03/11/2020 0854   CL 109 03/11/2020 0854   CO2 22 03/11/2020 0854   BUN 38 (H) 03/11/2020 0854   BUN 29 (H) 12/09/2016 0932   CREATININE 1.88 (H) 03/11/2020 0854   CREATININE 1.61 (H) 01/21/2016 1030      Component Value Date/Time   CALCIUM 9.5 03/11/2020 0854   CALCIUM 7.0 (L) 10/09/2015 0458   ALKPHOS 89 03/11/2020 0854   AST 33 03/11/2020 0854   ALT 31 03/11/2020 0854   BILITOT 0.3 03/11/2020 0854        Impression and Plan:  74 year old man with:  1. T3N2 rectal cancer presented with pelvic adenopathy diagnosed in March 2021.    He is currently receiving definitive therapy with radiation and oral Xeloda. Xeloda has been withheld because of toxicities. Risks and benefits of continuing the current approach was reviewed. He has 1 week left radiation which she will complete continue to receive. Upon completing therapy, he will update his staging including imaging studies and potentially endoscopic evaluation for possible evaluation of his tumor. He understands he will likely require surgical resection for curative purposes.  2.  IV access: Port-A-Cath remains in use without any issues.  3.  Antiemetics: Compazine is available to him without any nausea or vomiting.  4.  Neuropathy: No exacerbation noted since completing oxaliplatin.  5.  Neutropenia: Resolved at this time after withholding Xeloda.  6.  Thrombocytopenia: Continues to improve off chemotherapy.   7.  Follow-up: He will return in 4 weeks for repeat evaluation.    30  minutes were dedicated to this visit. The time was spent on reviewing his disease status, discussing treatment options and future plan  of care review.    Jared Button, MD 10/28/20218:40 AM

## 2020-03-20 ENCOUNTER — Ambulatory Visit
Admission: RE | Admit: 2020-03-20 | Discharge: 2020-03-20 | Disposition: A | Discharge status: Home / Self Care | Payer: Medicare Other | Source: Ambulatory Visit | Attending: Radiation Oncology | Admitting: Radiation Oncology

## 2020-03-20 DIAGNOSIS — C20 Malignant neoplasm of rectum: Secondary | ICD-10-CM | POA: Diagnosis not present

## 2020-03-20 DIAGNOSIS — Z510 Encounter for antineoplastic radiation therapy: Secondary | ICD-10-CM | POA: Diagnosis not present

## 2020-03-23 ENCOUNTER — Other Ambulatory Visit: Payer: Self-pay

## 2020-03-23 ENCOUNTER — Ambulatory Visit: Payer: Medicare Other

## 2020-03-23 ENCOUNTER — Ambulatory Visit
Admission: RE | Admit: 2020-03-23 | Discharge: 2020-03-23 | Disposition: A | Discharge status: Home / Self Care | Payer: Medicare Other | Source: Ambulatory Visit | Attending: Radiation Oncology | Admitting: Radiation Oncology

## 2020-03-23 ENCOUNTER — Telehealth: Payer: Self-pay | Admitting: Radiation Oncology

## 2020-03-23 DIAGNOSIS — Z510 Encounter for antineoplastic radiation therapy: Secondary | ICD-10-CM | POA: Diagnosis not present

## 2020-03-23 DIAGNOSIS — I2510 Atherosclerotic heart disease of native coronary artery without angina pectoris: Secondary | ICD-10-CM | POA: Diagnosis not present

## 2020-03-23 DIAGNOSIS — C20 Malignant neoplasm of rectum: Secondary | ICD-10-CM | POA: Insufficient documentation

## 2020-03-23 NOTE — Telephone Encounter (Signed)
  Radiation Oncology         [phone] ________________________________  Name: Jared White MRN: [id number]  Date of Service: 03/23/2020  DOB: 1946/04/02  Post Treatment Telephone Note  Diagnosis:   Stage III, [id number], adenocarcinoma of the rectum  Interval Since Last Radiation:  today 02/17/20-today: The rectum and regional nodes of the pelvis have been treated to 45 Gy in 25 fractions, and he has completed 1.8 Gy /5.4 Gy to the boost dose. He is scheduled to finish treatment on 03/25/20  Narrative:  The patient was contacted today for routine follow-up. During treatment he did very well with radiotherapy and has not had significant skin issues, but has had urinary frequency and burning, as well as pelvic discomfort with bowel movements. He has tried steroid suppositories without relief.  Impression/Plan: 1. Stage III,cT3bN2M0, adenocarcinoma of the rectum. The patient has been doing well and is having expected symptoms during treamtent. I encouraged him to consider OTC AZO, but for now he hopes his symptoms will remain stable and only start this if absolutely needed. We discussed that we would be happy to continue to follow him as needed, but he will also continue to follow up with Dr. Alen Blew in medical oncology. He is interested in surgery so I'll follow up with Dr. Marcello Moores to make sure he gets back in her clinic.    Carola Rhine, PAC

## 2020-03-24 ENCOUNTER — Other Ambulatory Visit: Payer: Self-pay

## 2020-03-24 ENCOUNTER — Ambulatory Visit
Admission: RE | Admit: 2020-03-24 | Discharge: 2020-03-24 | Disposition: A | Discharge status: Home / Self Care | Payer: Medicare Other | Source: Ambulatory Visit | Attending: Radiation Oncology | Admitting: Radiation Oncology

## 2020-03-24 DIAGNOSIS — C20 Malignant neoplasm of rectum: Secondary | ICD-10-CM | POA: Diagnosis not present

## 2020-03-24 DIAGNOSIS — Z510 Encounter for antineoplastic radiation therapy: Secondary | ICD-10-CM | POA: Diagnosis not present

## 2020-03-25 ENCOUNTER — Ambulatory Visit
Admission: RE | Admit: 2020-03-25 | Discharge: 2020-03-25 | Disposition: A | Discharge status: Home / Self Care | Payer: Medicare Other | Source: Ambulatory Visit | Attending: Radiation Oncology | Admitting: Radiation Oncology

## 2020-03-25 ENCOUNTER — Encounter: Payer: Self-pay | Admitting: Radiation Oncology

## 2020-03-25 DIAGNOSIS — Z510 Encounter for antineoplastic radiation therapy: Secondary | ICD-10-CM | POA: Diagnosis not present

## 2020-03-25 DIAGNOSIS — C20 Malignant neoplasm of rectum: Secondary | ICD-10-CM | POA: Diagnosis not present

## 2020-03-25 NOTE — Progress Notes (Signed)
These results were reviewed with the patient.

## 2020-03-27 ENCOUNTER — Other Ambulatory Visit (HOSPITAL_COMMUNITY)
Admission: RE | Admit: 2020-03-27 | Discharge: 2020-03-27 | Disposition: A | Discharge status: Home / Self Care | Payer: Medicare Other | Source: Ambulatory Visit | Attending: Cardiology | Admitting: Cardiology

## 2020-03-27 DIAGNOSIS — Z01812 Encounter for preprocedural laboratory examination: Secondary | ICD-10-CM | POA: Insufficient documentation

## 2020-03-27 DIAGNOSIS — Z20822 Contact with and (suspected) exposure to covid-19: Secondary | ICD-10-CM | POA: Insufficient documentation

## 2020-03-27 LAB — SARS CORONAVIRUS 2 (TAT 6-24 HRS): SARS Coronavirus 2: NEGATIVE

## 2020-03-30 ENCOUNTER — Ambulatory Visit: Payer: Medicare Other

## 2020-03-30 ENCOUNTER — Other Ambulatory Visit: Payer: Self-pay

## 2020-03-30 DIAGNOSIS — I2510 Atherosclerotic heart disease of native coronary artery without angina pectoris: Secondary | ICD-10-CM | POA: Diagnosis not present

## 2020-04-01 NOTE — Progress Notes (Signed)
Called and spoke with patient regarding his stress test results.

## 2020-04-08 NOTE — Progress Notes (Signed)
Please clarify

## 2020-04-13 ENCOUNTER — Ambulatory Visit: Payer: Self-pay | Admitting: General Surgery

## 2020-04-13 DIAGNOSIS — C20 Malignant neoplasm of rectum: Secondary | ICD-10-CM | POA: Diagnosis not present

## 2020-04-13 NOTE — H&P (Signed)
  The patient is a 74 year old male who presents with colorectal cancer.74 year old male who presents to the office with a diagnosis of rectal cancer seen on colonoscopy in March. He has been undergoing total neoadjuvant treatment with Dr. Alen Blew. He is scheduled for a follow-up colonoscopy later this month and then has plans to start radiation at the end of the month. Follow-up CT scans show a slightly enlarged mass but no signs of metastatic disease. His CEA levels have been normal. He has completed total neoadjuvant therapy and his final radiation treatment was 03/25/2020.   Problem List/Past Medical  RECTAL CANCER (C20)  Past Surgical History Bypass Surgery for Poor Blood Flow to Legs Colon Polyp Removal - Colonoscopy  Allergies  No Known Drug Allergies [01/30/2020]: Allergies Reconciled  Medication History  Atenolol (50MG  Tablet, Oral) Active. Famotidine (20MG  Tablet, Oral) Active. Nitroglycerin (0.4MG  Tab Sublingual, Sublingual) Active. Rosuvastatin Calcium (20MG  Tablet, Oral) Active. Valsartan (160MG  Tablet, Oral) Active. Medications Reconciled Lidocaine-Prilocaine (2.5-2.5% Cream, External) Active. Prochlorperazine Maleate (10MG  Tablet, Oral) Active. Omeprazole (40MG  Capsule DR, Oral) Active. Aspirin (81MG  Tablet Chewable, Oral) Active. Cyanocobalamin (1000MCG Tablet Chewable, Oral) Active.  Other Problems Leighton Ruff, MD; 29/06/1113 9:20 AM) Back Pain Chronic Renal Failure Syndrome Congestive Heart Failure Gastroesophageal Reflux Disease Kidney Stone Myocardial infarction Rectal Cancer Vascular Disease    Vitals (Chanel Nolan CMA; 04/13/2020 9:09 AM) 04/13/2020 9:09 AM Weight: 155.5 lb Height: 68in Body Surface Area: 1.84 m Body Mass Index: 23.64 kg/m  Temp.: 13F  Pulse: 95 (Regular)  BP: 132/76(Sitting, Left Arm, Standard)        Physical Exam   General Mental Status-Alert. General  Appearance-Cooperative.  Abdomen Palpation/Percussion Palpation and Percussion of the abdomen reveal - Soft.    Assessment & Plan  RECTAL CANCER (C20) Impression: 74 year old male with a distal rectal cancer and significant perirectal metastatic disease. He is status post total neoadjuvant therapy. He completed his radiation in early November 2021. CT scan done in September, does show a relatively large posterior perirectal lymph node. This does not appear to violate the mesorectal plane and the scan was done pre-radiation. I have recommended a robotic-assisted low anterior resection with diverting ileostomy. We discussed the need for probable IV fluids for the first couple weeks after surgery due to his renal insufficiency. We will also get cardiac clearance prior to surgery. The surgery and anatomy were described to the patient as well as the risks of surgery and the possible complications. These include: Bleeding, deep abdominal infections and possible wound complications such as hernia and infection, damage to adjacent structures, leak of surgical connections, which can lead to other surgeries and possibly an ostomy, possible need for other procedures, such as abscess drains in radiology, possible prolonged hospital stay, possible diarrhea from removal of part of the colon, possible constipation from narcotics, possible bowel, bladder or sexual dysfunction if having rectal surgery, prolonged fatigue/weakness or appetite loss, possible early recurrence of of disease, possible complications of their medical problems such as heart disease or arrhythmias or lung problems, death (less than 1%). I believe the patient understands and wishes to proceed with the surgery.

## 2020-04-20 ENCOUNTER — Other Ambulatory Visit: Payer: Self-pay | Admitting: Cardiology

## 2020-04-20 ENCOUNTER — Telehealth: Payer: Self-pay | Admitting: Radiation Oncology

## 2020-04-20 NOTE — Telephone Encounter (Signed)
  Radiation Oncology         [phone] ________________________________  Name: Jared White MRN: [id number]  Date of Service: 04/20/2020  DOB: [birth year]  Post Treatment Telephone Note  Diagnosis:   Stage III, [id number], adenocarcinoma of the rectum  Interval Since Last Radiation: 4 weeks  02/17/20-03/25/20: The rectum and regional nodes of the pelvis were treated to 45 Gy in 25 fractions, as well as a 5.4 Gy to the boost in 3 fractions,   Narrative:  The patient was contacted today for routine follow-up. During treatment he did very well with radiotherapy and did not havesignificant skin issues, but experienced urinary frequency and burning, as well as pelvic discomfort with bowel movements. He met with Dr. Marcello Moores and is awaiting repeat CT scans on 04/29/20, and the scheduling of robotic LAR with Dr. Marcello Moores.    Impression/Plan: 1. Stage III,cT3bN2M0, adenocarcinoma of the rectum. I was unable to reach the patient but left a voicemail and on the message let him know that we would follow along while he will proceed with restaging scans with Dr. Alen Blew and that we were aware of the plan that Dr. Marcello Moores anticipates robotic LAR in January 2022. We will follow his course expectantly and see him back as needed.     Carola Rhine, PAC

## 2020-04-28 ENCOUNTER — Inpatient Hospital Stay: Payer: Medicare Other

## 2020-04-28 ENCOUNTER — Inpatient Hospital Stay: Payer: Medicare Other | Attending: Oncology

## 2020-04-28 ENCOUNTER — Other Ambulatory Visit: Payer: Self-pay

## 2020-04-28 DIAGNOSIS — C20 Malignant neoplasm of rectum: Secondary | ICD-10-CM | POA: Diagnosis not present

## 2020-04-28 DIAGNOSIS — Z923 Personal history of irradiation: Secondary | ICD-10-CM | POA: Insufficient documentation

## 2020-04-28 DIAGNOSIS — D6959 Other secondary thrombocytopenia: Secondary | ICD-10-CM | POA: Insufficient documentation

## 2020-04-28 DIAGNOSIS — N200 Calculus of kidney: Secondary | ICD-10-CM | POA: Insufficient documentation

## 2020-04-28 DIAGNOSIS — I700 Atherosclerosis of aorta: Secondary | ICD-10-CM | POA: Insufficient documentation

## 2020-04-28 DIAGNOSIS — Z79899 Other long term (current) drug therapy: Secondary | ICD-10-CM | POA: Diagnosis not present

## 2020-04-28 DIAGNOSIS — Z95828 Presence of other vascular implants and grafts: Secondary | ICD-10-CM

## 2020-04-28 LAB — CMP (CANCER CENTER ONLY)
ALT: 15 U/L (ref 0–44)
AST: 21 U/L (ref 15–41)
Albumin: 3.4 g/dL — ABNORMAL LOW (ref 3.5–5.0)
Alkaline Phosphatase: 69 U/L (ref 38–126)
Anion gap: 9 (ref 5–15)
BUN: 16 mg/dL (ref 8–23)
CO2: 25 mmol/L (ref 22–32)
Calcium: 8.9 mg/dL (ref 8.9–10.3)
Chloride: 110 mmol/L (ref 98–111)
Creatinine: 1.22 mg/dL (ref 0.61–1.24)
GFR, Estimated: >60 mL/min (ref >60)
Glucose, Bld: 138 mg/dL — ABNORMAL HIGH (ref 70–99)
Potassium: 3.2 mmol/L — ABNORMAL LOW (ref 3.5–5.1)
Sodium: 144 mmol/L (ref 135–145)
Total Bilirubin: 0.5 mg/dL (ref 0.3–1.2)
Total Protein: 6.7 g/dL (ref 6.5–8.1)

## 2020-04-28 LAB — CBC WITH DIFFERENTIAL (CANCER CENTER ONLY)
Abs Immature Granulocytes: 0.04 10*3/uL (ref 0.00–0.07)
Basophils Absolute: 0 10*3/uL (ref 0.0–0.1)
Basophils Relative: 1 %
Eosinophils Absolute: 0.1 10*3/uL (ref 0.0–0.5)
Eosinophils Relative: 2 %
HCT: 33.5 % — ABNORMAL LOW (ref 39.0–52.0)
Hemoglobin: 11.4 g/dL — ABNORMAL LOW (ref 13.0–17.0)
Immature Granulocytes: 1 %
Lymphocytes Relative: 17 %
Lymphs Abs: 0.8 10*3/uL (ref 0.7–4.0)
MCH: 33.5 pg (ref 26.0–34.0)
MCHC: 34 g/dL (ref 30.0–36.0)
MCV: 98.5 fL (ref 80.0–100.0)
Monocytes Absolute: 0.4 10*3/uL (ref 0.1–1.0)
Monocytes Relative: 9 %
Neutro Abs: 3.1 10*3/uL (ref 1.7–7.7)
Neutrophils Relative %: 70 %
Platelet Count: 80 10*3/uL — ABNORMAL LOW (ref 150–400)
RBC: 3.4 MIL/uL — ABNORMAL LOW (ref 4.22–5.81)
RDW: 13.9 % (ref 11.5–15.5)
WBC Count: 4.4 10*3/uL (ref 4.0–10.5)
nRBC: 0 % (ref 0.0–0.2)

## 2020-04-28 MED ORDER — HEPARIN SOD (PORK) LOCK FLUSH 100 UNIT/ML IV SOLN
500.0000 [IU] | Freq: Once | INTRAVENOUS | Status: AC
  Administered 2020-04-28: 500 [IU]
  Filled 2020-04-28: qty 5

## 2020-04-28 MED ORDER — SODIUM CHLORIDE 0.9% FLUSH
10.0000 mL | Freq: Once | INTRAVENOUS | Status: AC
  Administered 2020-04-28: 10 mL
  Filled 2020-04-28: qty 10

## 2020-04-29 ENCOUNTER — Ambulatory Visit (HOSPITAL_COMMUNITY)
Admission: RE | Admit: 2020-04-29 | Discharge: 2020-04-29 | Disposition: A | Discharge status: Home / Self Care | Payer: Medicare Other | Source: Ambulatory Visit | Attending: Oncology | Admitting: Oncology

## 2020-04-29 DIAGNOSIS — C189 Malignant neoplasm of colon, unspecified: Secondary | ICD-10-CM | POA: Diagnosis not present

## 2020-04-29 DIAGNOSIS — C20 Malignant neoplasm of rectum: Secondary | ICD-10-CM | POA: Insufficient documentation

## 2020-04-29 IMAGING — CT CT ABD-PELV W/O CM
2 of 4 series · 14 of 36 positions shown, 17 images · non-contrast
Comparison: [DATE].

CLINICAL DATA: Colorectal cancer.

EXAM:
CT CHEST, ABDOMEN AND PELVIS WITHOUT CONTRAST
TECHNIQUE: Multidetector CT imaging of the chest, abdomen and pelvis was
performed following the standard protocol without IV contrast.

[Series 2: cap w/o · axial · non-contrast · 0.81mm/px · z∈[-168,+362]mm · 11 of 128 slices shown, 14 images]
[im 11/128  mediastinal]
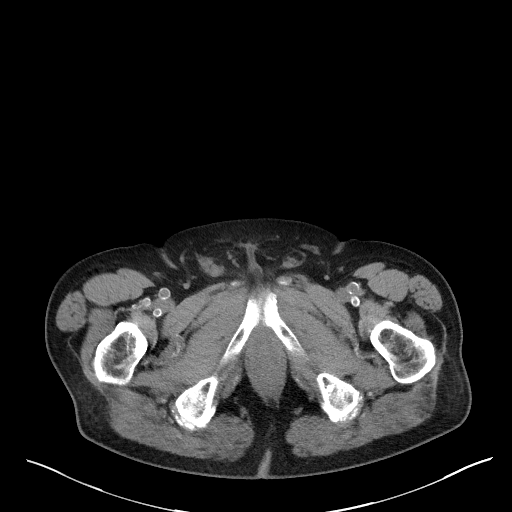
[im 11/128  lung]
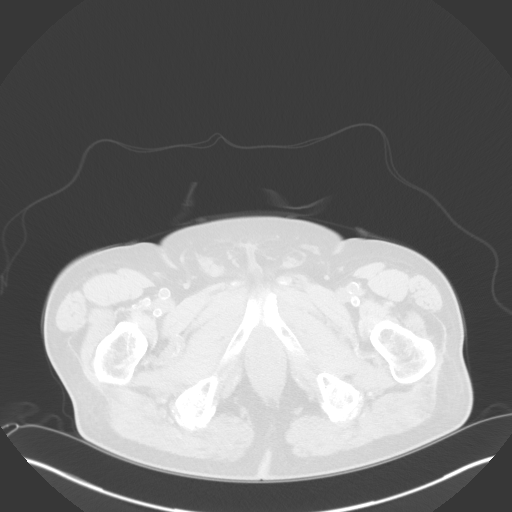
[im 22/128  lung]
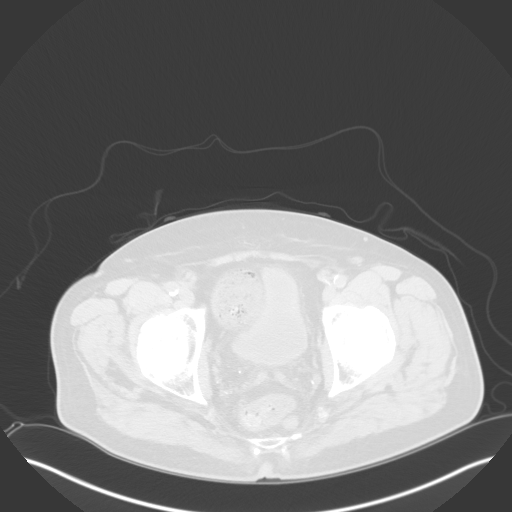
[im 32/128  lung]
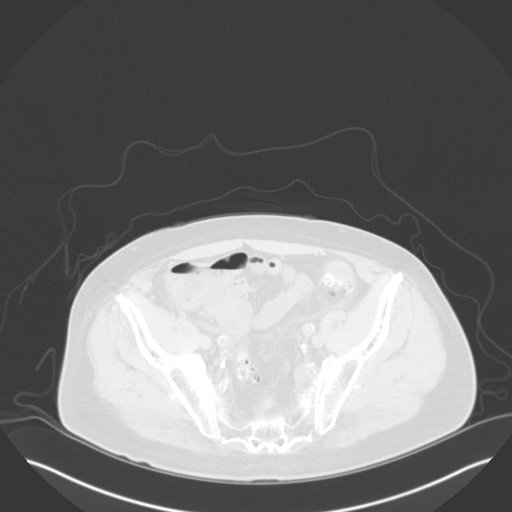
[im 43/128  lung]
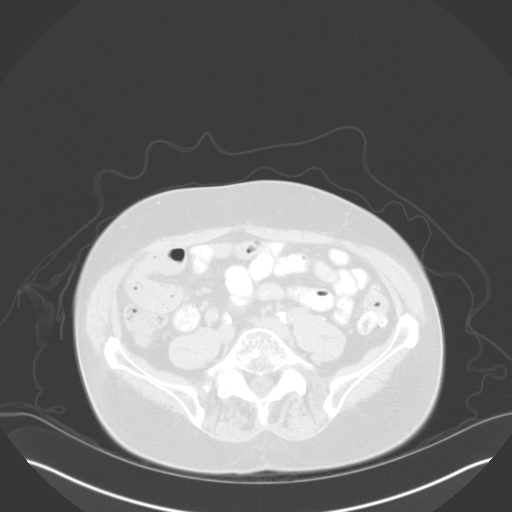
[im 53/128  mediastinal]
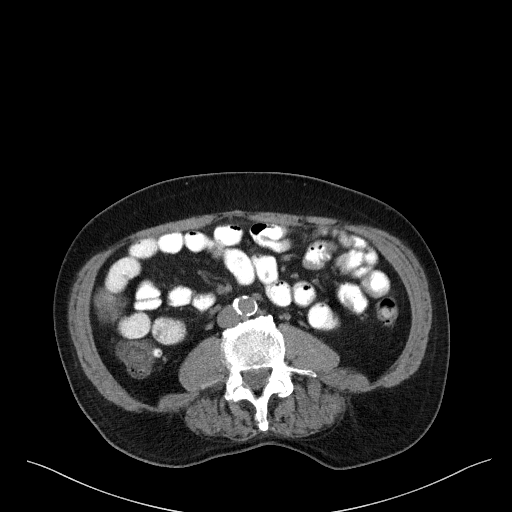
[im 53/128  lung]
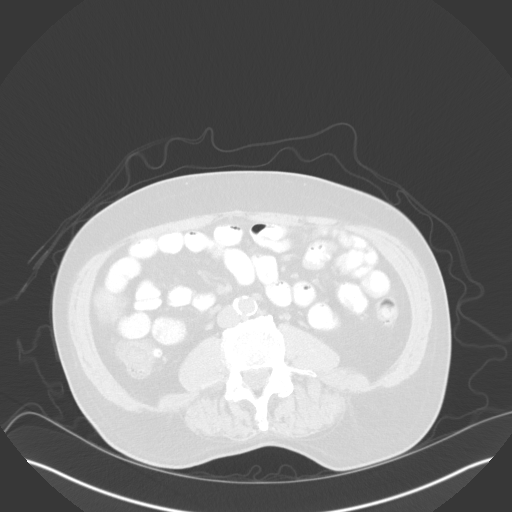
[im 64/128  lung]
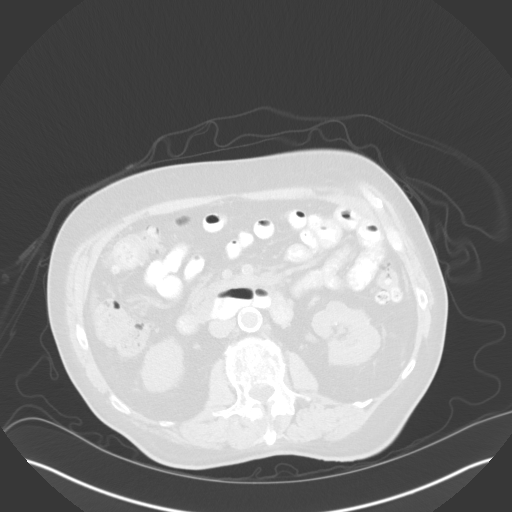
[im 75/128  lung]
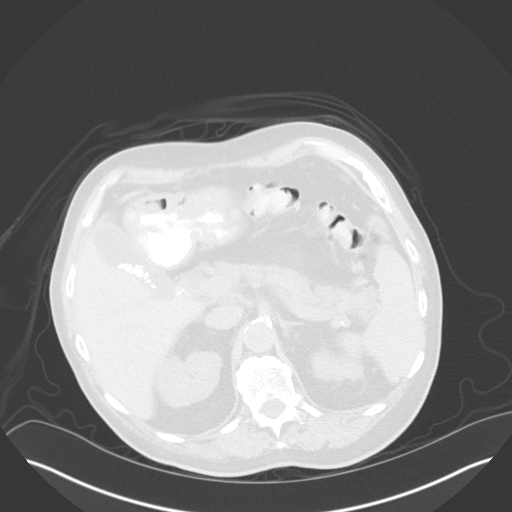
[im 85/128  lung]
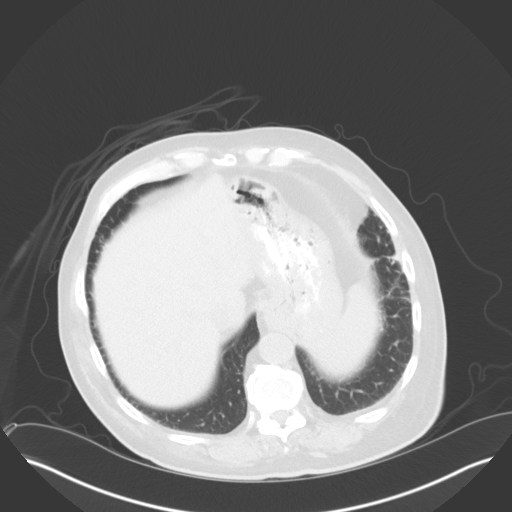
[im 96/128  mediastinal]
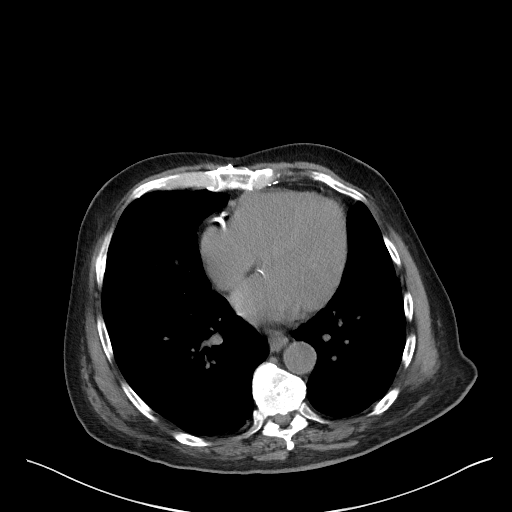
[im 96/128  lung]
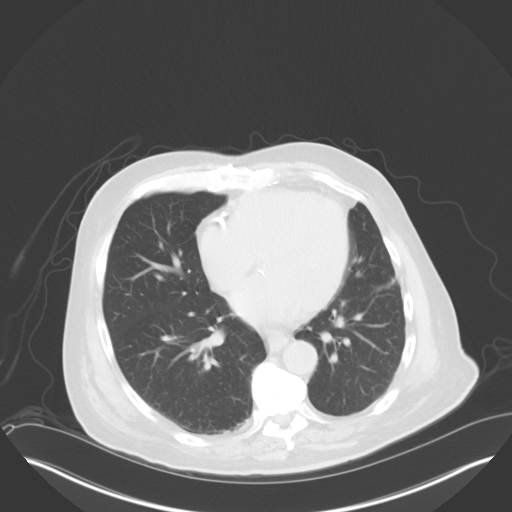
[im 106/128  lung]
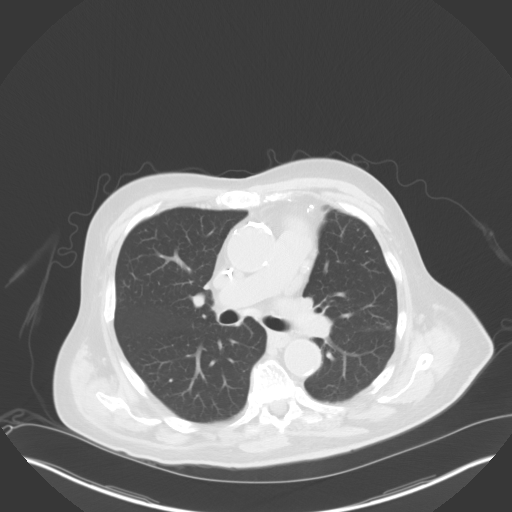
[im 117/128  lung]
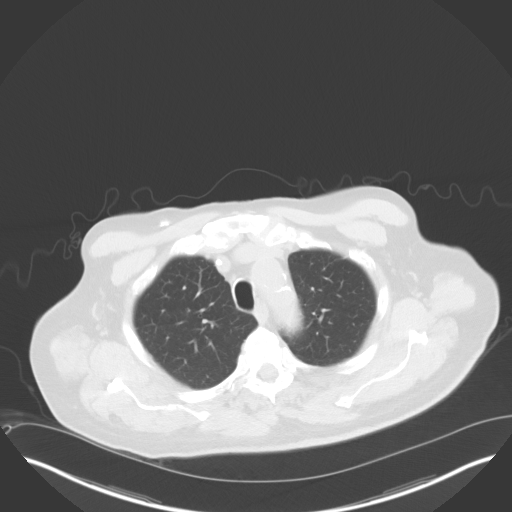

[Series 5: coronals · coronal · 0.82mm/px · 3 of 144 slices shown]
[im 29/144  lung]
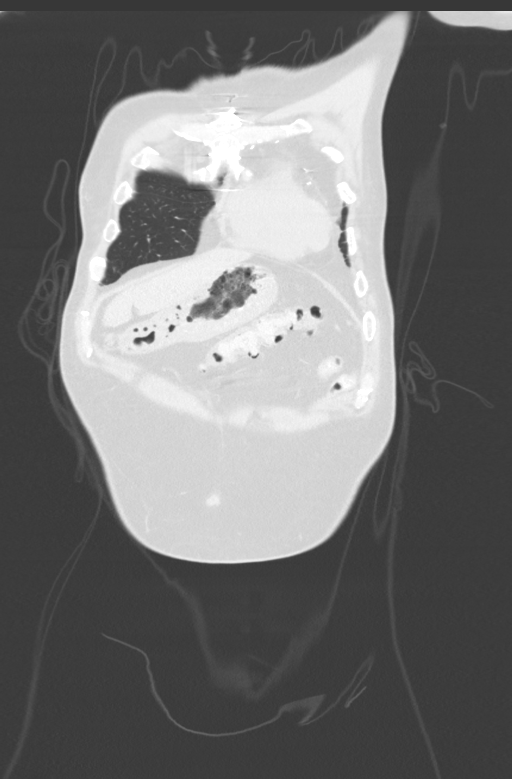
[im 58/144  lung]
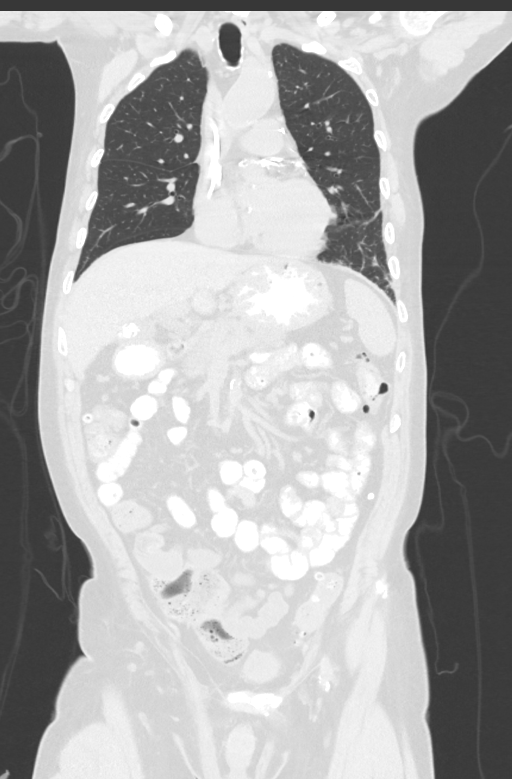
[im 86/144  lung]
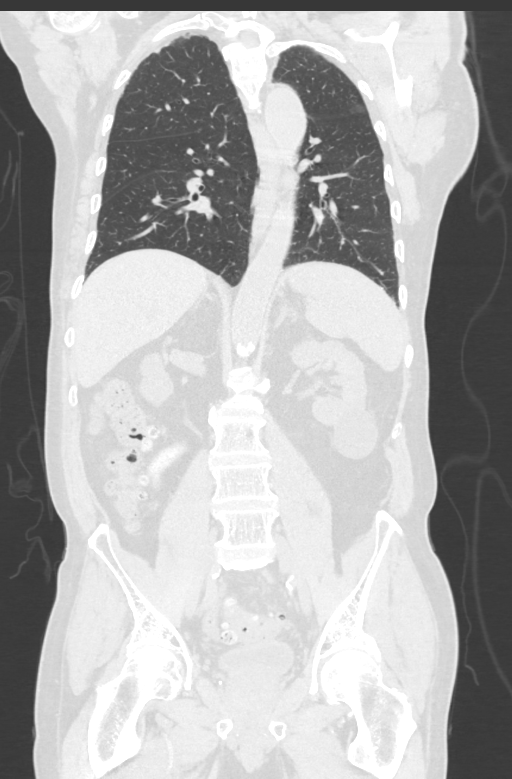

[14 of 36 positions shown; findings below may reference images not displayed]

FINDINGS: CT CHEST FINDINGS

Cardiovascular: Right IJ Port-A-Cath terminates in the right atrium.
Atherosclerotic calcification of the aorta and aortic valve. Heart
is at the upper limits of normal in size. No pericardial effusion.

Mediastinum/Nodes: No pathologically enlarged mediastinal or
axillary lymph nodes. Hilar regions are difficult to evaluate
without IV contrast. Esophagus is grossly unremarkable.

Lungs/Pleura: Biapical pleuroparenchymal scarring. Minimal scarring
in the inferior lingula and anterior left lower lobe. Calcified
granuloma in the left lower lobe. No suspicious pulmonary nodules.
No pleural fluid. Airway is unremarkable.

Musculoskeletal: Degenerative changes in the spine. No worrisome
lytic or sclerotic lesions.

CT ABDOMEN PELVIS FINDINGS

Hepatobiliary: Liver is unremarkable. Stones layer in the
gallbladder. No biliary ductal dilatation.

Pancreas: Negative.

Spleen: Negative.

Adrenals/Urinary Tract: Adrenal glands are unremarkable. Stones are
seen in the kidneys bilaterally. Low-attenuation lesions in the
kidneys measure up to 3.8 cm off the lower pole left kidney and
likely represent cysts. Ureters are decompressed. Bladder is grossly
unremarkable.

Stomach/Bowel: Small hiatal hernia. Duodenal diverticula. Stomach,
small bowel and colon are unremarkable. Appendix is not readily
visualized.

Vascular/Lymphatic: Atherosclerotic calcification of the aorta
without aneurysm. Left perirectal lymph node measures 10 mm (2/107),
decreased in size from 1.6 cm on [DATE]. No additional
pathologically enlarged lymph nodes.

Reproductive: Prostate is visualized.

Other: No free fluid.  Slight haziness in the pelvic mesenteries.

Musculoskeletal: Degenerative changes in the spine. No worrisome
lytic or sclerotic lesions. L2 compression deformity is unchanged.
IMPRESSION: 1. Continued decrease in size of a perirectal lymph node. Otherwise,
no evidence of metastatic disease.
2. Bilateral renal stones.
3.  Aortic atherosclerosis ([L8]-[L8]).

## 2020-05-01 ENCOUNTER — Encounter: Payer: Self-pay | Admitting: Oncology

## 2020-05-01 ENCOUNTER — Inpatient Hospital Stay (HOSPITAL_BASED_OUTPATIENT_CLINIC_OR_DEPARTMENT_OTHER): Payer: Medicare Other | Admitting: Oncology

## 2020-05-01 ENCOUNTER — Other Ambulatory Visit: Payer: Self-pay

## 2020-05-01 VITALS — BP 154/81 | HR 73 | Temp 96.3°F | Resp 18 | Ht 68.0 in | Wt 146.3 lb

## 2020-05-01 DIAGNOSIS — C20 Malignant neoplasm of rectum: Secondary | ICD-10-CM

## 2020-05-01 DIAGNOSIS — D6959 Other secondary thrombocytopenia: Secondary | ICD-10-CM | POA: Diagnosis not present

## 2020-05-01 DIAGNOSIS — Z923 Personal history of irradiation: Secondary | ICD-10-CM | POA: Diagnosis not present

## 2020-05-01 DIAGNOSIS — N200 Calculus of kidney: Secondary | ICD-10-CM | POA: Diagnosis not present

## 2020-05-01 DIAGNOSIS — I700 Atherosclerosis of aorta: Secondary | ICD-10-CM | POA: Diagnosis not present

## 2020-05-01 DIAGNOSIS — Z79899 Other long term (current) drug therapy: Secondary | ICD-10-CM | POA: Diagnosis not present

## 2020-05-01 NOTE — Progress Notes (Signed)
Hematology and Oncology Follow Up Visit  Jared White [id number] Apr 05, 1946 74 y.o. 05/01/2020 3:07 PM Jared White, MDGreene, [name], MD   Principle Diagnosis: 74 year old man with rectal cancer presented with pelvic adenopathy in March 2021.  He was found to have T3N2 disease.   Prior Therapy:   He is status post colonoscopy and endoscopy completed on August 13, 2019.  Rectal biopsy showed an invasive adenocarcinoma.  Neoadjuvant chemotherapy utilizing FOLFOX started on September 05 2019.   He completed 7 cycles of therapy in August 2021.  Radiation therapy with oral Xeloda started on September 27.  Therapy completed on November 3 of 2021.  He received 45 Gy in 25 fractions to the rectum and regional lymph nodes.  Current therapy: Under evaluation for surgical resection.   Interim History: Jared White returns today for a follow-up visit.  Since the last visit, he was evaluated by Jared White regarding his rectal surgery which will be planned in the near future.  He denies any residual complications related to treatment including nausea, vomiting or abdominal pain.  He denies any hospitalization or illnesses.  He denies any constipation or diarrhea.  He is able to move his bowels regularly although does report occasional pain with defecation.    Medications: Updated without changes. Current Outpatient Medications  Medication Sig Dispense Refill  . aspirin EC 81 MG tablet Take 1 tablet (81 mg total) by mouth daily. 90 tablet 3  . atenolol (TENORMIN) 50 MG tablet TAKE 1 TABLET BY MOUTH EVERY DAY 90 tablet 3  . calcium carbonate (OSCAL) 1500 (600 Ca) MG TABS tablet Take by mouth daily.    . famotidine (PEPCID) 20 MG tablet Take 1 tablet (20 mg total) by mouth 2 (two) times daily. 60 tablet 5  . fluticasone (FLONASE) 50 MCG/ACT nasal spray INSTILL 2 SPRAYS INTO EACH NOSTRIL EVERY DAY 48 g 2  . Magnesium Oxide 400 MG CAPS Take 1 capsule (400 mg total) by mouth 2 (two) times daily. 60  capsule 0  . nitroGLYCERIN (NITROSTAT) 0.4 MG SL tablet SMARTSIG:2 Tablet(s) Sublingual PRN    . Omega-3 Fatty Acids (OMEGA 3 PO) Take 520 mg by mouth daily.     . rosuvastatin (CRESTOR) 20 MG tablet TAKE 1 TABLET BY MOUTH EVERY DAY 90 tablet 2  . silver sulfADIAZINE (SILVADENE) 1 % cream Apply 1 application topically daily. 400 g 1  . valsartan (DIOVAN) 160 MG tablet TAKE 1 TABLET BY MOUTH EVERY DAY 90 tablet 3  . vitamin B-12 (CYANOCOBALAMIN) 1000 MCG tablet Take 1,000 mcg by mouth daily.     No current facility-administered medications for this visit.     Allergies: No Known Allergies    Physical Exam:       Blood pressure (!) 154/81, pulse 73, temperature (!) 96.3 F (35.7 C), temperature source Tympanic, resp. rate 18, height 5\' 8"  (1.727 m), weight 146 lb 4.8 oz (66.4 kg), SpO2 99 %.    ECOG: 1   General appearance: Alert, awake without any distress. Head: Atraumatic without abnormalities Oropharynx: Without any thrush or ulcers. Eyes: No scleral icterus. Lymph nodes: No lymphadenopathy noted in the cervical, supraclavicular, or axillary nodes Heart:regular rate and rhythm, without any murmurs or gallops.   Lung: Clear to auscultation without any rhonchi, wheezes or dullness to percussion. Abdomin: Soft, nontender without any shifting dullness or ascites. Musculoskeletal: No clubbing or cyanosis. Neurological: No motor or sensory deficits. Skin: No rashes or lesions.  Lab Results: Lab Results  Component Value Date   WBC 4.4 04/28/2020   HGB 11.4 (L) 04/28/2020   HCT 33.5 (L) 04/28/2020   MCV 98.5 04/28/2020   PLT 80 (L) 04/28/2020     Chemistry      Component Value Date/Time   NA 144 04/28/2020 0837   NA 140 12/09/2016 0932   K 3.2 (L) 04/28/2020 0837   CL 110 04/28/2020 0837   CO2 25 04/28/2020 0837   BUN 16 04/28/2020 0837   BUN 29 (H) 12/09/2016 0932   CREATININE 1.22 04/28/2020 0837   CREATININE 1.61 (H) 01/21/2016 1030       Component Value Date/Time   CALCIUM 8.9 04/28/2020 0837   CALCIUM 7.0 (L) 10/09/2015 0458   ALKPHOS 69 04/28/2020 0837   AST 21 04/28/2020 0837   ALT 15 04/28/2020 0837   BILITOT 0.5 04/28/2020 0837     Vascular/Lymphatic: Atherosclerotic calcification of the aorta without aneurysm. Left perirectal lymph node measures 10 mm (2/107), decreased in size from 1.6 cm on 01/28/2020. No additional pathologically enlarged lymph nodes.  Reproductive: Prostate is visualized.  Other: No free fluid.  Slight haziness in the pelvic mesenteries.  Musculoskeletal: Degenerative changes in the spine. No worrisome lytic or sclerotic lesions. L2 compression deformity is unchanged.  IMPRESSION: 1. Continued decrease in size of a perirectal lymph node. Otherwise, no evidence of metastatic disease. 2. Bilateral renal stones. 3.  Aortic atherosclerosis (ICD10-I70.0).    Impression and Plan:  74 year old man with:  1.  Stage III rectal cancer diagnosed in March 2021.  He was found to have T3N2 disease at presentation.  He has a completed day neoadjuvant chemotherapy followed by chemotherapy and radiation completed on November 3.  CT scan obtained on April 29, 2020 was personally reviewed and showed clinical response predominantly decrease in his perirectal lymph nodes.  The largest measuring currently 10 mm and was previously 1.6 cm.  The natural course of this disease was reviewed and the treatment options were discussed. Surgical therapy at this time will be the standard of care given his likely residual disease and would benefit from this approach for curative purposes.  Active surveillance would be an alternative but would be high risk of disease relapse and possible obstruction.  He is agreeable to proceed and will communicate with Jared White.  2.  IV access: Port-A-Cath will be flushed periodically and will be kept in place for the time being.  We will flush this  periodically.   3.  Thrombocytopenia: Related to chemotherapy and overall fluctuating blood close to normal range.  4.  Follow-up: In 3 months for repeat follow-up.    30  minutes were spent on this encounter.  The time was dedicated to reviewing his disease status, reviewing imaging studies and outlining future plan of care.    [name], MD 12/10/20213:07 PM

## 2020-06-02 NOTE — Patient Instructions (Addendum)
DUE TO COVID-19 ONLY ONE VISITOR IS ALLOWED TO COME WITH YOU AND STAY IN THE WAITING ROOM ONLY DURING PRE OP AND PROCEDURE DAY OF SURGERY. THE 1 VISITOR  MAY VISIT WITH YOU AFTER SURGERY IN YOUR PRIVATE ROOM DURING VISITING HOURS ONLY!  YOU NEED TO HAVE A COVID 19 TEST ON: 06/06/20 @ 9:00 AM, THIS TEST MUST BE DONE BEFORE SURGERY,  COVID TESTING SITE [place]  [id number], IT IS ON THE RIGHT GOING OUT [address] APPROXIMATELY  2 MINUTES PAST [employer] ON THE RIGHT. ONCE YOUR COVID TEST IS COMPLETED,  PLEASE BEGIN THE QUARANTINE INSTRUCTIONS AS OUTLINED IN YOUR HANDOUT.                [place]    Your procedure is scheduled on: 06/10/20   Report to [facility] Main  Entrance   Report to admitting at : 10:00 AM     Call this number if you have problems the morning of surgery [phone]    Remember:   DRINK 2 [place] AT  1000 PM AND 1 PRESURGERY DRINK THE DAY OF THE PROCEDURE 3 HOURS PRIOR TO SCHEDULED SURGERY. NO SOLIDS AFTER MIDNIGHT THE DAY PRIOR TO THE SURGERY. NOTHING BY MOUTH EXCEPT CLEAR LIQUIDS UNTIL THREE HOURS PRIOR TO SCHEDULED SURGERY: 9:00 AM. PLEASE FINISH PRESURGERY ENSURE DRINK PER SURGEON ORDER 3 HOURS PRIOR TO SCHEDULED SURGERY TIME WHICH NEEDS TO BE COMPLETED AT : 9:00 AM.  CLEAR LIQUID DIET   Foods Allowed                                                                     Foods Excluded  Coffee and tea, regular and decaf                             liquids that you cannot  Plain Jell-O any favor except red or purple                                           see through such as: Fruit ices (not with fruit pulp)                                     milk, soups, orange juice  Iced Popsicles                                    All solid food Carbonated beverages, regular and diet                                    Cranberry, grape and apple juices Sports drinks like  Gatorade Lightly seasoned clear broth or consume(fat free) Sugar, honey syrup  Sample Menu Breakfast  Lunch                                     Supper Cranberry juice                    Beef broth                            Chicken broth Jell-O                                     Grape juice                           Apple juice Coffee or tea                        Jell-O                                      Popsicle                                                Coffee or tea                        Coffee or tea  _____________________________________________________________________  BRUSH YOUR TEETH MORNING OF SURGERY AND RINSE YOUR MOUTH OUT, NO CHEWING GUM CANDY OR MINTS.   Take these medicines the morning of surgery with A SIP OF WATER: omeprazole.                               You may not have any metal on your body including hair pins and              piercings  Do not wear jewelry, lotions, powders or perfumes, deodorant             Men may shave face and neck.   Do not bring valuables to the hospital. [place].  Contacts, dentures or bridgework may not be worn into surgery.  Leave suitcase in the car. After surgery it may be brought to your room.     Patients discharged the day of surgery will not be allowed to drive home. IF YOU ARE HAVING SURGERY AND GOING HOME THE SAME DAY, YOU MUST HAVE AN ADULT TO DRIVE YOU HOME AND BE WITH YOU FOR 24 HOURS. YOU MAY GO HOME BY TAXI OR UBER OR ORTHERWISE, BUT AN ADULT MUST ACCOMPANY YOU HOME AND STAY WITH YOU FOR 24 HOURS.  Name and phone number of your driver:  Special Instructions: N/A              Please read over the following fact sheets you were given: _____________________________________________________________________         [facility] - Preparing for Surgery Before surgery, you can play an important role.  Because skin is not sterile, your skin  needs to be as free of germs as possible.  You can reduce the number of germs on your skin by washing with CHG (chlorahexidine gluconate) soap before surgery.  CHG is an antiseptic cleaner which kills germs and bonds with the skin to continue killing germs even after washing. Please DO NOT use if you have an allergy to CHG or antibacterial soaps.  If your skin becomes reddened/irritated stop using the CHG and inform your nurse when you arrive at Short Stay. Do not shave (including legs and underarms) for at least 48 hours prior to the first CHG shower.  You may shave your face/neck. Please follow these instructions carefully:  1.  Shower with CHG Soap the night before surgery and the  morning of Surgery.  2.  If you choose to wash your hair, wash your hair first as usual with your  normal  shampoo.  3.  After you shampoo, rinse your hair and body thoroughly to remove the  shampoo.                           4.  Use CHG as you would any other liquid soap.  You can apply chg directly  to the skin and wash                       Gently with a scrungie or clean washcloth.  5.  Apply the CHG Soap to your body ONLY FROM THE NECK DOWN.   Do not use on face/ open                           Wound or open sores. Avoid contact with eyes, ears mouth and genitals (private parts).                       Wash face,  Genitals (private parts) with your normal soap.             6.  Wash thoroughly, paying special attention to the area where your surgery  will be performed.  7.  Thoroughly rinse your body with warm water from the neck down.  8.  DO NOT shower/wash with your normal soap after using and rinsing off  the CHG Soap.                9.  Pat yourself dry with a clean towel.            10.  Wear clean pajamas.            11.  Place clean sheets on your bed the night of your first shower and do not  sleep with pets. Day of Surgery : Do not apply any lotions/deodorants the morning of surgery.  Please wear clean  clothes to the hospital/surgery center.  FAILURE TO FOLLOW THESE INSTRUCTIONS MAY RESULT IN THE CANCELLATION OF YOUR SURGERY PATIENT SIGNATURE_________________________________  NURSE SIGNATURE__________________________________  ________________________________________________________________________   Adam Phenix  An incentive spirometer is a tool that can help keep your lungs clear and active. This tool measures how well you are filling your lungs with each breath. Taking long deep breaths may help reverse or decrease the chance of developing breathing (pulmonary) problems (especially infection) following:  A long period of time when you are unable to move or be active. BEFORE THE PROCEDURE  If the spirometer includes an indicator to show your best effort, your nurse or respiratory therapist will set it to a desired goal.  If possible, sit up straight or lean slightly forward. Try not to slouch.  Hold the incentive spirometer in an upright position. INSTRUCTIONS FOR USE  1. Sit on the edge of your bed if possible, or sit up as far as you can in bed or on a chair. 2. Hold the incentive spirometer in an upright position. 3. Breathe out normally. 4. Place the mouthpiece in your mouth and seal your lips tightly around it. 5. Breathe in slowly and as deeply as possible, raising the piston or the ball toward the top of the column. 6. Hold your breath for 3-5 seconds or for as long as possible. Allow the piston or ball to fall to the bottom of the column. 7. Remove the mouthpiece from your mouth and breathe out normally. 8. Rest for a few seconds and repeat Steps 1 through 7 at least 10 times every 1-2 hours when you are awake. Take your time and take a few normal breaths between deep breaths. 9. The spirometer may include an indicator to show your best effort. Use the indicator as a goal to work toward during each repetition. 10. After each set of 10 deep breaths, practice  coughing to be sure your lungs are clear. If you have an incision (the cut made at the time of surgery), support your incision when coughing by placing a pillow or rolled up towels firmly against it. Once you are able to get out of bed, walk around indoors and cough well. You may stop using the incentive spirometer when instructed by your caregiver.  RISKS AND COMPLICATIONS  Take your time so you do not get dizzy or light-headed.  If you are in pain, you may need to take or ask for pain medication before doing incentive spirometry. It is harder to take a deep breath if you are having pain. AFTER USE  Rest and breathe slowly and easily.  It can be helpful to keep track of a log of your progress. Your caregiver can provide you with a simple table to help with this. If you are using the spirometer at home, follow these instructions: [place] IF:   You are having difficultly using the spirometer.  You have trouble using the spirometer as often as instructed.  Your pain medication is not giving enough relief while using the spirometer.  You develop fever of 100.5 F (38.1 C) or higher. SEEK IMMEDIATE MEDICAL CARE IF:   You cough up bloody sputum that had not been present before.  You develop fever of 102 F (38.9 C) or greater.  You develop worsening pain at or near the incision site. MAKE SURE YOU:   Understand these instructions.  Will watch your condition.  Will get help right away if you are not doing well or get worse. Document Released: 09/19/2006 Document Revised: 08/01/2011 Document Reviewed: 11/20/2006 [facility] Patient Information 2014 [place], [place].   ________________________________________________________________________

## 2020-06-03 ENCOUNTER — Other Ambulatory Visit: Payer: Self-pay

## 2020-06-03 ENCOUNTER — Encounter (HOSPITAL_COMMUNITY): Payer: Self-pay

## 2020-06-03 ENCOUNTER — Encounter (HOSPITAL_COMMUNITY)
Admission: RE | Admit: 2020-06-03 | Discharge: 2020-06-03 | Disposition: A | Discharge status: Home / Self Care | Payer: Medicare Other | Source: Ambulatory Visit | Attending: General Surgery | Admitting: General Surgery

## 2020-06-03 DIAGNOSIS — Z01812 Encounter for preprocedural laboratory examination: Secondary | ICD-10-CM | POA: Insufficient documentation

## 2020-06-03 HISTORY — DX: Cardiac arrhythmia, unspecified: I49.9

## 2020-06-03 HISTORY — DX: Prediabetes: R73.03

## 2020-06-03 HISTORY — DX: Unspecified osteoarthritis, unspecified site: M19.90

## 2020-06-03 LAB — CBC
HCT: 37.7 % — ABNORMAL LOW (ref 39.0–52.0)
Hemoglobin: 12.3 g/dL — ABNORMAL LOW (ref 13.0–17.0)
MCH: 33.5 pg (ref 26.0–34.0)
MCHC: 32.6 g/dL (ref 30.0–36.0)
MCV: 102.7 fL — ABNORMAL HIGH (ref 80.0–100.0)
Platelets: 91 10*3/uL — ABNORMAL LOW (ref 150–400)
RBC: 3.67 MIL/uL — ABNORMAL LOW (ref 4.22–5.81)
RDW: 13.7 % (ref 11.5–15.5)
WBC: 4.5 10*3/uL (ref 4.0–10.5)
nRBC: 0 % (ref 0.0–0.2)

## 2020-06-03 LAB — BASIC METABOLIC PANEL
Anion gap: 7 (ref 5–15)
BUN: 28 mg/dL — ABNORMAL HIGH (ref 8–23)
CO2: 26 mmol/L (ref 22–32)
Calcium: 8.9 mg/dL (ref 8.9–10.3)
Chloride: 108 mmol/L (ref 98–111)
Creatinine, Ser: 1.24 mg/dL (ref 0.61–1.24)
GFR, Estimated: >60 mL/min (ref >60)
Glucose, Bld: 112 mg/dL — ABNORMAL HIGH (ref 70–99)
Potassium: 4.2 mmol/L (ref 3.5–5.1)
Sodium: 141 mmol/L (ref 135–145)

## 2020-06-03 LAB — HEMOGLOBIN A1C
Hgb A1c MFr Bld: 5.4 % (ref 4.8–5.6)
Mean Plasma Glucose: 108.28 mg/dL

## 2020-06-03 NOTE — Consult Note (Signed)
[place] Nurse requested for preoperative stoma site marking  Discussed surgical procedure and stoma creation with patient.  He is prepared for a temporary ostomy and appreciative for teaching and space to ask/answer questions.   Explained role of the [place] nurse team.  Provided the patient with educational booklet and provided samples of pouching options.  Answered patient questions.   Examined patient  sitting, and standing in order to place the marking in the patient's visual field, away from any creases or abdominal contour issues and within the rectus muscle.  Patient with 35 pound weight loss and some sagging abdominal skin in lower planes.  Wears his belt at umbilicus level.    Marked for colostomy in the LLQ  4 cm to the left of the umbilicus and  3 cm above the umbilicus.  Marked for ileostomy in the RLQ  4 cm to the right of the umbilicus and  3 cm above the umbilicus.   Patient's abdomen cleansed with CHG wipes at site markings, allowed to air dry prior to marking.Covered mark with thin film transparent dressing to preserve mark until date of surgery.   [place] Nurse team will follow up with patient after surgery for continue ostomy care and teaching.   Domenic Moras MSN, RN, FNP-BC CWON Wound, Ostomy, Continence Nurse Pager [phone]

## 2020-06-03 NOTE — Progress Notes (Addendum)
COVID Vaccine Completed: Yes Date COVID Vaccine completed: 01/17/20 [place] COVID vaccine manufacturer: [place]     PCP -  Dr. [name]. Cardiologist - Dr. Vernell Leep. LOV: 03/19/20  Chest x-ray - 04/30/20 EKG - 03/19/20 Stress Test -  ECHO - 03/23/20 Cardiac Cath -  Pacemaker/ICD device last checked:  Sleep Study - Yes CPAP - No  Fasting Blood Sugar -  Checks Blood Sugar _____ times a day  Blood Thinner Instructions: Aspirin Instructions: Last Dose:  Anesthesia review: Hx: HTN,CAD,CHF,CABG  Patient denies shortness of breath, fever, cough and chest pain at PAT appointment   Patient verbalized understanding of instructions that were given to them at the PAT appointment. Patient was also instructed that they will need to review over the PAT instructions again at home before surgery.

## 2020-06-04 NOTE — Anesthesia Preprocedure Evaluation (Addendum)
Anesthesia Evaluation  Patient identified by MRN, date of birth, ID band Patient awake    Reviewed: Allergy & Precautions, NPO status , Patient's Chart, lab work & pertinent test results, reviewed documented beta blocker date and time   History of Anesthesia Complications Negative for: history of anesthetic complications  Airway Mallampati: II  TM Distance: >3 FB Neck ROM: Full    Dental  (+) Missing,    Pulmonary neg pulmonary ROS,    Pulmonary exam normal        Cardiovascular hypertension, Pt. on home beta blockers and Pt. on medications + CAD, + Cardiac Stents and + CABG (2001)  Normal cardiovascular exam  TTE 03/23/2020: EF 50-55%, mild TR   Neuro/Psych negative neurological ROS  negative psych ROS   GI/Hepatic Neg liver ROS, GERD  ,  Endo/Other  negative endocrine ROS  Renal/GU Renal InsufficiencyRenal disease  negative genitourinary   Musculoskeletal  (+) Arthritis ,   Abdominal   Peds  Hematology Hgb 12.3, plt 91   Anesthesia Other Findings Day of surgery medications reviewed with patient.  Reproductive/Obstetrics negative OB ROS                           Anesthesia Physical Anesthesia Plan  ASA: III  Anesthesia Plan: General   Post-op Pain Management:    Induction: Intravenous  PONV Risk Score and Plan: 4 or greater and Treatment may vary due to age or medical condition, Ondansetron and Dexamethasone  Airway Management Planned: Oral ETT  Additional Equipment: Arterial line  Intra-op Plan:   Post-operative Plan: Extubation in OR  Informed Consent: I have reviewed the patients History and Physical, chart, labs and discussed the procedure including the risks, benefits and alternatives for the proposed anesthesia with the patient or authorized representative who has indicated his/her understanding and acceptance.     Dental advisory given  Plan Discussed with:  CRNA  Anesthesia Plan Comments: (See PAT note 06/03/20, Konrad Felix, PA-C)      Anesthesia Quick Evaluation

## 2020-06-04 NOTE — Progress Notes (Signed)
Anesthesia Chart Review   Case: [id number] Date/Time: 06/10/20 0815   Procedures:      XI ROBOTIC ASSISTED LOWER ANTERIOR RESECTION (N/A )     DIVERTING ILEOSTOMY (N/A )   Anesthesia type: General   Pre-op diagnosis: rectal cancer   Location: [facility] ROOM 02 / [facility]   Surgeons: Leighton Ruff, MD      [id number]:75 y.o. never smoker with h/o GERD, HTN, CAD (CABG 2001), CHF, rectal cancer scheduled for above procedure 7/82/9562 with Dr. Leighton Ruff.   Pt last seen by cardiology 03/19/2020. Per OV note, "Stable. Recommend aspirin 81 mg, Crestor 20 mg. Given his low functional capacity and possible upcoming rectal cancer surgery, recommend echocardiogram and exercise/lexiscan nuclear stress test for pre-op risk stratification."   Low risk stress test 03/30/2020. Per results, "Low risk for surgery. May proceed." Echo 03/23/2020 with EF 50-55%.   Anticipate pt can proceed with planned procedure barring acute status change.   VS: BP (!) 164/81   Pulse (!) 54   Temp 36.8 C (Oral)   Ht 5\' 8"  (1.727 m)   Wt 70.8 kg   SpO2 97%   BMI 23.72 kg/m   PROVIDERS: [name], MD is PCP   Vernell Leep, MD is Cardiologist  LABS: Labs reviewed: Acceptable for surgery. (all labs ordered are listed, but only abnormal results are displayed)  Labs Reviewed  BASIC METABOLIC PANEL - Abnormal; Notable for the following components:      Result Value   Glucose, Bld 112 (*)    BUN 28 (*)    All other components within normal limits  CBC - Abnormal; Notable for the following components:   RBC 3.67 (*)    Hemoglobin 12.3 (*)    HCT 37.7 (*)    MCV 102.7 (*)    Platelets 91 (*)    All other components within normal limits  HEMOGLOBIN A1C     IMAGES:   EKG: 03/19/2020 Rate 67 bpm  Sinus Rhythm   CV: Lexiscan (Walking with mod Bruce)Tetrofosmin Stress Test  03/30/2020: Nondiagnostic ECG stress. Converted to walking Lexiscan after 3:00 Bruce attempt due to dyspnea.  Myocardial  perfusion is normal with mild diaphragmatic attenuation in inferior wall. minimal ischemia in this region cannot be excluded.  Overall LV systolic function is normal without regional wall motion abnormalities. Stress LV EF: 57%.  No previous exam available for comparison. Low risk.   Echocardiogram 03/23/2020:  Normal LV systolic function with visual EF 50-55%. Abnormal septal wall  motion due to post-operative coronary artery bypass graft. Left ventricle  cavity is normal in size. Normal global wall motion. Normal diastolic  filling pattern, normal LAP. Calculated EF 54%.  Structurally normal tricuspid valve. No evidence of tricuspid stenosis.  Mild tricuspid regurgitation. No evidence of pulmonary hypertension.  No other significant valvular abnormalities are seen.  Compared to prior study dated 2014: no significant change noted.   Echo 12/24/2012 Study Conclusions   - Left ventricle: The cavity size was normal. Wall thickness  was increased in a pattern of mild LVH. There was mild  concentric hypertrophy. Systolic function was normal. The  estimated ejection fraction was in the range of 55% to  60%. Wall motion was normal; there were no regional wall  motion abnormalities. Left ventricular diastolic function  parameters were normal.  - Ventricular septum: Thickness was mildly increased.  - Mitral valve: Mild regurgitation.  - Left atrium: The atrium was mildly dilated.  - Atrial septum: No defect or patent  foramen ovale was  identified.   Past Medical History:  Diagnosis Date  . Arthritis   . Broken back   . CAD (coronary artery disease)    PCI & CABG  . CHF (congestive heart failure) ([place])   . Diverticulitis    mild - approx 2004  . Dysrhythmia    occasional arrythmias  . Family history of heart disease   . GERD (gastroesophageal reflux disease)   . History of nuclear stress test 06/30/2011   lexiscan; normal pattern of perfusion post-stress; low risk scan    . Hyperlipidemia   . Hypertension   . IgA nephropathy   . Irregular heart beat   . Pre-diabetes   . Rectal cancer ([place]) 08/19/2019  . Systemic hypertension     Past Surgical History:  Procedure Laterality Date  . CARDIAC CATHETERIZATION  08/1995   PTCA of OM (Dr. Marella Chimes)  . CORONARY ARTERY BYPASS GRAFT  01/2000   x5 - LIMA to LAD, SVG to diagonal, sequential SVG to ramus & OM, SVG to PDA (Dr. Tharon Aquas Trigt)  . IR IMAGING GUIDED PORT INSERTION  09/04/2019  . LUNG LOBECTOMY     left upper lobe  . TRANSTHORACIC ECHOCARDIOGRAM  12/24/2012   EF 55-60%, mild LVH, mild conc hypertrophy; mild MR; LA mildly dilated    MEDICATIONS: . ascorbic acid (VITAMIN C) 500 MG tablet  . aspirin EC 81 MG tablet  . atenolol (TENORMIN) 50 MG tablet  . Calcium 600-200 MG-UNIT tablet  . Magnesium 250 MG TABS  . naproxen sodium (ALEVE) 220 MG tablet  . nitroGLYCERIN (NITROSTAT) 0.4 MG SL tablet  . omeprazole (PRILOSEC) 40 MG capsule  . rosuvastatin (CRESTOR) 20 MG tablet  . silver sulfADIAZINE (SILVADENE) 1 % cream  . valsartan (DIOVAN) 160 MG tablet  . vitamin B-12 (CYANOCOBALAMIN) 50 MCG tablet  . vitamin E 45 MG (100 UNITS) capsule   No current facility-administered medications for this encounter.   Konrad Felix, PA-C [facility] Pre-Surgical Testing [phone]

## 2020-06-06 ENCOUNTER — Other Ambulatory Visit (HOSPITAL_COMMUNITY)
Admission: RE | Admit: 2020-06-06 | Discharge: 2020-06-06 | Disposition: A | Discharge status: Home / Self Care | Payer: Medicare Other | Source: Ambulatory Visit | Attending: General Surgery | Admitting: General Surgery

## 2020-06-06 DIAGNOSIS — Z01812 Encounter for preprocedural laboratory examination: Secondary | ICD-10-CM | POA: Diagnosis not present

## 2020-06-06 DIAGNOSIS — Z20822 Contact with and (suspected) exposure to covid-19: Secondary | ICD-10-CM | POA: Insufficient documentation

## 2020-06-06 LAB — SARS CORONAVIRUS 2 (TAT 6-24 HRS): SARS Coronavirus 2: NEGATIVE

## 2020-06-09 MED ORDER — BUPIVACAINE LIPOSOME 1.3 % IJ SUSP
20.0000 mL | Freq: Once | INTRAMUSCULAR | Status: DC
  Filled 2020-06-09: qty 20

## 2020-06-10 ENCOUNTER — Other Ambulatory Visit: Payer: Self-pay

## 2020-06-10 ENCOUNTER — Inpatient Hospital Stay (HOSPITAL_COMMUNITY): Payer: Medicare Other | Admitting: Physician Assistant

## 2020-06-10 ENCOUNTER — Encounter (HOSPITAL_COMMUNITY): Payer: Self-pay | Admitting: General Surgery

## 2020-06-10 ENCOUNTER — Inpatient Hospital Stay (HOSPITAL_COMMUNITY): Payer: Medicare Other | Admitting: Anesthesiology

## 2020-06-10 ENCOUNTER — Encounter (HOSPITAL_COMMUNITY)
Admission: RE | Disposition: A | Discharge status: Home Health | Payer: Self-pay | Source: Ambulatory Visit | Attending: General Surgery

## 2020-06-10 ENCOUNTER — Inpatient Hospital Stay (HOSPITAL_COMMUNITY)
Admission: RE | Admit: 2020-06-10 | Discharge: 2020-06-16 | DRG: 330 | Disposition: A | Discharge status: Home Health | Payer: Medicare Other | Source: Ambulatory Visit | Attending: General Surgery | Admitting: General Surgery

## 2020-06-10 DIAGNOSIS — C7800 Secondary malignant neoplasm of unspecified lung: Secondary | ICD-10-CM | POA: Diagnosis present

## 2020-06-10 DIAGNOSIS — Z951 Presence of aortocoronary bypass graft: Secondary | ICD-10-CM

## 2020-06-10 DIAGNOSIS — I129 Hypertensive chronic kidney disease with stage 1 through stage 4 chronic kidney disease, or unspecified chronic kidney disease: Secondary | ICD-10-CM | POA: Diagnosis present

## 2020-06-10 DIAGNOSIS — I252 Old myocardial infarction: Secondary | ICD-10-CM

## 2020-06-10 DIAGNOSIS — Z923 Personal history of irradiation: Secondary | ICD-10-CM

## 2020-06-10 DIAGNOSIS — Z23 Encounter for immunization: Secondary | ICD-10-CM

## 2020-06-10 DIAGNOSIS — Z9221 Personal history of antineoplastic chemotherapy: Secondary | ICD-10-CM | POA: Diagnosis not present

## 2020-06-10 DIAGNOSIS — E785 Hyperlipidemia, unspecified: Secondary | ICD-10-CM | POA: Diagnosis present

## 2020-06-10 DIAGNOSIS — I2510 Atherosclerotic heart disease of native coronary artery without angina pectoris: Secondary | ICD-10-CM | POA: Diagnosis present

## 2020-06-10 DIAGNOSIS — Z7982 Long term (current) use of aspirin: Secondary | ICD-10-CM

## 2020-06-10 DIAGNOSIS — Z955 Presence of coronary angioplasty implant and graft: Secondary | ICD-10-CM

## 2020-06-10 DIAGNOSIS — C20 Malignant neoplasm of rectum: Secondary | ICD-10-CM | POA: Diagnosis not present

## 2020-06-10 DIAGNOSIS — N1832 Chronic kidney disease, stage 3b: Secondary | ICD-10-CM | POA: Diagnosis present

## 2020-06-10 DIAGNOSIS — I110 Hypertensive heart disease with heart failure: Secondary | ICD-10-CM | POA: Diagnosis not present

## 2020-06-10 DIAGNOSIS — K219 Gastro-esophageal reflux disease without esophagitis: Secondary | ICD-10-CM | POA: Diagnosis not present

## 2020-06-10 DIAGNOSIS — C772 Secondary and unspecified malignant neoplasm of intra-abdominal lymph nodes: Secondary | ICD-10-CM | POA: Diagnosis present

## 2020-06-10 DIAGNOSIS — Z79899 Other long term (current) drug therapy: Secondary | ICD-10-CM | POA: Diagnosis not present

## 2020-06-10 DIAGNOSIS — I509 Heart failure, unspecified: Secondary | ICD-10-CM | POA: Diagnosis not present

## 2020-06-10 DIAGNOSIS — K5730 Diverticulosis of large intestine without perforation or abscess without bleeding: Secondary | ICD-10-CM | POA: Diagnosis not present

## 2020-06-10 HISTORY — PX: XI ROBOTIC ASSISTED LOWER ANTERIOR RESECTION: SHX6558

## 2020-06-10 HISTORY — PX: DIVERTING ILEOSTOMY: SHX5799

## 2020-06-10 LAB — SAMPLE TO BLOOD BANK

## 2020-06-10 SURGERY — RESECTION, RECTUM, LOW ANTERIOR, ROBOT-ASSISTED
Anesthesia: General | Site: Abdomen

## 2020-06-10 MED ORDER — FENTANYL CITRATE (PF) 100 MCG/2ML IJ SOLN
INTRAMUSCULAR | Status: DC | PRN
  Administered 2020-06-10 (×6): 50 ug via INTRAVENOUS

## 2020-06-10 MED ORDER — ONDANSETRON HCL 4 MG/2ML IJ SOLN
INTRAMUSCULAR | Status: AC
  Filled 2020-06-10: qty 2

## 2020-06-10 MED ORDER — ROCURONIUM BROMIDE 10 MG/ML (PF) SYRINGE
PREFILLED_SYRINGE | INTRAVENOUS | Status: AC
  Filled 2020-06-10: qty 10

## 2020-06-10 MED ORDER — NITROGLYCERIN 0.4 MG SL SUBL: 0.4000 mg | SUBLINGUAL_TABLET | SUBLINGUAL | Status: DC | PRN

## 2020-06-10 MED ORDER — ATENOLOL 50 MG PO TABS
50.0000 mg | ORAL_TABLET | Freq: Every day | ORAL | Status: DC
  Administered 2020-06-10 – 2020-06-16 (×7): 50 mg via ORAL
  Filled 2020-06-10 (×7): qty 1

## 2020-06-10 MED ORDER — TRAMADOL HCL 50 MG PO TABS
50.0000 mg | ORAL_TABLET | Freq: Four times a day (QID) | ORAL | Status: DC | PRN
Start: 2020-06-10 — End: 2020-06-16
  Administered 2020-06-10: 19:00:00 50 mg via ORAL
  Filled 2020-06-10: qty 1

## 2020-06-10 MED ORDER — KCL IN DEXTROSE-NACL 20-5-0.45 MEQ/L-%-% IV SOLN
INTRAVENOUS | Status: DC
  Filled 2020-06-10 (×9): qty 1000

## 2020-06-10 MED ORDER — PHENYLEPHRINE HCL (PRESSORS) 10 MG/ML IV SOLN
INTRAVENOUS | Status: AC
  Filled 2020-06-10: qty 1

## 2020-06-10 MED ORDER — 0.9 % SODIUM CHLORIDE (POUR BTL) OPTIME
TOPICAL | Status: DC | PRN
  Administered 2020-06-10: 2000 mL

## 2020-06-10 MED ORDER — ONDANSETRON HCL 4 MG/2ML IJ SOLN: 4.0000 mg | Freq: Four times a day (QID) | INTRAMUSCULAR | Status: DC | PRN

## 2020-06-10 MED ORDER — ONDANSETRON HCL 4 MG/2ML IJ SOLN
INTRAMUSCULAR | Status: DC | PRN
  Administered 2020-06-10: 4 mg via INTRAVENOUS

## 2020-06-10 MED ORDER — PHENYLEPHRINE 40 MCG/ML (10ML) SYRINGE FOR IV PUSH (FOR BLOOD PRESSURE SUPPORT)
PREFILLED_SYRINGE | INTRAVENOUS | Status: AC
  Filled 2020-06-10: qty 10

## 2020-06-10 MED ORDER — ACETAMINOPHEN 500 MG PO TABS
1000.0000 mg | ORAL_TABLET | Freq: Four times a day (QID) | ORAL | Status: DC
  Administered 2020-06-10 – 2020-06-15 (×19): 1000 mg via ORAL
  Filled 2020-06-10 (×20): qty 2

## 2020-06-10 MED ORDER — ALUM & MAG HYDROXIDE-SIMETH 200-200-20 MG/5ML PO SUSP: 30.0000 mL | Freq: Four times a day (QID) | ORAL | Status: DC | PRN

## 2020-06-10 MED ORDER — PANTOPRAZOLE SODIUM 40 MG PO TBEC
80.0000 mg | DELAYED_RELEASE_TABLET | Freq: Every day | ORAL | Status: DC
  Administered 2020-06-11 – 2020-06-16 (×6): 80 mg via ORAL
  Filled 2020-06-10 (×7): qty 2

## 2020-06-10 MED ORDER — SACCHAROMYCES BOULARDII 250 MG PO CAPS
250.0000 mg | ORAL_CAPSULE | Freq: Two times a day (BID) | ORAL | Status: DC
  Administered 2020-06-10 – 2020-06-16 (×12): 250 mg via ORAL
  Filled 2020-06-10 (×12): qty 1

## 2020-06-10 MED ORDER — LIDOCAINE 2% (20 MG/ML) 5 ML SYRINGE
INTRAMUSCULAR | Status: DC | PRN
  Administered 2020-06-10: 1.5 mg/kg/h via INTRAVENOUS

## 2020-06-10 MED ORDER — SODIUM CHLORIDE 0.9 % IV SOLN
2.0000 g | INTRAVENOUS | Status: AC
  Administered 2020-06-10: 2 g via INTRAVENOUS
  Filled 2020-06-10: qty 2

## 2020-06-10 MED ORDER — MIDAZOLAM HCL 2 MG/2ML IJ SOLN
INTRAMUSCULAR | Status: AC
  Filled 2020-06-10: qty 2

## 2020-06-10 MED ORDER — PROPOFOL 10 MG/ML IV BOLUS
INTRAVENOUS | Status: AC
  Filled 2020-06-10: qty 20

## 2020-06-10 MED ORDER — ONDANSETRON HCL 4 MG PO TABS: 4.0000 mg | ORAL_TABLET | Freq: Four times a day (QID) | ORAL | Status: DC | PRN

## 2020-06-10 MED ORDER — PHENYLEPHRINE 40 MCG/ML (10ML) SYRINGE FOR IV PUSH (FOR BLOOD PRESSURE SUPPORT)
PREFILLED_SYRINGE | INTRAVENOUS | Status: DC | PRN
  Administered 2020-06-10: 120 ug via INTRAVENOUS
  Administered 2020-06-10 (×2): 80 ug via INTRAVENOUS
  Administered 2020-06-10: 40 ug via INTRAVENOUS
  Administered 2020-06-10: 80 ug via INTRAVENOUS

## 2020-06-10 MED ORDER — DIPHENHYDRAMINE HCL 12.5 MG/5ML PO ELIX
12.5000 mg | ORAL_SOLUTION | Freq: Four times a day (QID) | ORAL | Status: DC | PRN
  Administered 2020-06-14: 12.5 mg via ORAL
  Filled 2020-06-10: qty 5

## 2020-06-10 MED ORDER — BUPIVACAINE LIPOSOME 1.3 % IJ SUSP
INTRAMUSCULAR | Status: DC | PRN
  Administered 2020-06-10: 20 mL

## 2020-06-10 MED ORDER — ALVIMOPAN 12 MG PO CAPS
12.0000 mg | ORAL_CAPSULE | ORAL | Status: AC
  Administered 2020-06-10: 12 mg via ORAL
  Filled 2020-06-10: qty 1

## 2020-06-10 MED ORDER — CHLORHEXIDINE GLUCONATE 0.12 % MT SOLN: 15.0000 mL | Freq: Once | OROMUCOSAL | Status: AC

## 2020-06-10 MED ORDER — LIDOCAINE 2% (20 MG/ML) 5 ML SYRINGE
INTRAMUSCULAR | Status: DC | PRN
  Administered 2020-06-10: 60 mg via INTRAVENOUS

## 2020-06-10 MED ORDER — LIDOCAINE HCL (PF) 2 % IJ SOLN
INTRAMUSCULAR | Status: AC
  Filled 2020-06-10: qty 5

## 2020-06-10 MED ORDER — LACTATED RINGERS IV SOLN: INTRAVENOUS | Status: DC

## 2020-06-10 MED ORDER — GABAPENTIN 300 MG PO CAPS
300.0000 mg | ORAL_CAPSULE | ORAL | Status: AC
  Administered 2020-06-10: 300 mg via ORAL
  Filled 2020-06-10: qty 1

## 2020-06-10 MED ORDER — SODIUM CHLORIDE 0.9 % IV SOLN
2.0000 g | Freq: Two times a day (BID) | INTRAVENOUS | Status: AC
  Administered 2020-06-10: 23:00:00 2 g via INTRAVENOUS
  Filled 2020-06-10: qty 2

## 2020-06-10 MED ORDER — FENTANYL CITRATE (PF) 100 MCG/2ML IJ SOLN: 25.0000 ug | INTRAMUSCULAR | Status: DC | PRN

## 2020-06-10 MED ORDER — ENSURE PRE-SURGERY PO LIQD
296.0000 mL | Freq: Once | ORAL | Status: DC
  Filled 2020-06-10: qty 296

## 2020-06-10 MED ORDER — ACETAMINOPHEN 500 MG PO TABS
1000.0000 mg | ORAL_TABLET | ORAL | Status: AC
  Administered 2020-06-10: 1000 mg via ORAL
  Filled 2020-06-10: qty 2

## 2020-06-10 MED ORDER — DIPHENHYDRAMINE HCL 50 MG/ML IJ SOLN: 12.5000 mg | Freq: Four times a day (QID) | INTRAMUSCULAR | Status: DC | PRN

## 2020-06-10 MED ORDER — BUPIVACAINE-EPINEPHRINE 0.25% -1:200000 IJ SOLN
INTRAMUSCULAR | Status: DC | PRN
  Administered 2020-06-10: 30 mL

## 2020-06-10 MED ORDER — OXYCODONE HCL 5 MG/5ML PO SOLN: 5.0000 mg | Freq: Once | ORAL | Status: DC | PRN

## 2020-06-10 MED ORDER — BUPIVACAINE-EPINEPHRINE (PF) 0.25% -1:200000 IJ SOLN
INTRAMUSCULAR | Status: AC
  Filled 2020-06-10: qty 30

## 2020-06-10 MED ORDER — ENSURE SURGERY PO LIQD
237.0000 mL | Freq: Two times a day (BID) | ORAL | Status: DC
  Administered 2020-06-11 – 2020-06-16 (×8): 237 mL via ORAL

## 2020-06-10 MED ORDER — ACETAMINOPHEN 500 MG PO TABS: 1000.0000 mg | ORAL_TABLET | Freq: Once | ORAL | Status: DC

## 2020-06-10 MED ORDER — ORAL CARE MOUTH RINSE
15.0000 mL | Freq: Once | OROMUCOSAL | Status: AC
  Administered 2020-06-10: 15 mL via OROMUCOSAL

## 2020-06-10 MED ORDER — ALVIMOPAN 12 MG PO CAPS
12.0000 mg | ORAL_CAPSULE | Freq: Two times a day (BID) | ORAL | Status: DC
  Administered 2020-06-11 (×2): 12 mg via ORAL
  Filled 2020-06-10 (×2): qty 1

## 2020-06-10 MED ORDER — DEXAMETHASONE SODIUM PHOSPHATE 10 MG/ML IJ SOLN
INTRAMUSCULAR | Status: AC
  Filled 2020-06-10: qty 1

## 2020-06-10 MED ORDER — FENTANYL CITRATE (PF) 100 MCG/2ML IJ SOLN
INTRAMUSCULAR | Status: AC
  Filled 2020-06-10: qty 2

## 2020-06-10 MED ORDER — PROPOFOL 10 MG/ML IV BOLUS
INTRAVENOUS | Status: DC | PRN
  Administered 2020-06-10: 150 mg via INTRAVENOUS

## 2020-06-10 MED ORDER — EPHEDRINE 5 MG/ML INJ
INTRAVENOUS | Status: AC
  Filled 2020-06-10: qty 10

## 2020-06-10 MED ORDER — INDOCYANINE GREEN 25 MG IV SOLR
INTRAVENOUS | Status: DC | PRN
  Administered 2020-06-10 (×2): 7.5 mg via INTRAVENOUS

## 2020-06-10 MED ORDER — LIDOCAINE HCL 2 % IJ SOLN
INTRAMUSCULAR | Status: AC
  Filled 2020-06-10: qty 20

## 2020-06-10 MED ORDER — HEPARIN SODIUM (PORCINE) 5000 UNIT/ML IJ SOLN
5000.0000 [IU] | Freq: Once | INTRAMUSCULAR | Status: AC
  Administered 2020-06-10: 5000 [IU] via SUBCUTANEOUS
  Filled 2020-06-10: qty 1

## 2020-06-10 MED ORDER — GABAPENTIN 300 MG PO CAPS
300.0000 mg | ORAL_CAPSULE | Freq: Two times a day (BID) | ORAL | Status: DC
  Administered 2020-06-10 – 2020-06-16 (×12): 300 mg via ORAL
  Filled 2020-06-10 (×12): qty 1

## 2020-06-10 MED ORDER — PHENYLEPHRINE HCL-NACL 10-0.9 MG/250ML-% IV SOLN
INTRAVENOUS | Status: DC | PRN
  Administered 2020-06-10: 40 ug/min via INTRAVENOUS

## 2020-06-10 MED ORDER — SIMETHICONE 80 MG PO CHEW
40.0000 mg | CHEWABLE_TABLET | Freq: Four times a day (QID) | ORAL | Status: DC | PRN
Start: 2020-06-10 — End: 2020-06-16

## 2020-06-10 MED ORDER — PROMETHAZINE HCL 25 MG/ML IJ SOLN: 6.2500 mg | INTRAMUSCULAR | Status: DC | PRN

## 2020-06-10 MED ORDER — ENSURE PRE-SURGERY PO LIQD
592.0000 mL | Freq: Once | ORAL | Status: DC
  Filled 2020-06-10: qty 592

## 2020-06-10 MED ORDER — OXYCODONE HCL 5 MG PO TABS
5.0000 mg | ORAL_TABLET | Freq: Once | ORAL | Status: DC | PRN
Start: 2020-06-10 — End: 2020-06-10

## 2020-06-10 MED ORDER — SUGAMMADEX SODIUM 200 MG/2ML IV SOLN
INTRAVENOUS | Status: DC | PRN
  Administered 2020-06-10: 150 mg via INTRAVENOUS

## 2020-06-10 MED ORDER — LACTATED RINGERS IR SOLN
Status: DC | PRN
  Administered 2020-06-10: 1000 mL

## 2020-06-10 MED ORDER — MIDAZOLAM HCL 2 MG/2ML IJ SOLN
INTRAMUSCULAR | Status: DC | PRN
  Administered 2020-06-10: 2 mg via INTRAVENOUS

## 2020-06-10 MED ORDER — DEXAMETHASONE SODIUM PHOSPHATE 10 MG/ML IJ SOLN
INTRAMUSCULAR | Status: DC | PRN
  Administered 2020-06-10: 4 mg via INTRAVENOUS

## 2020-06-10 MED ORDER — EPHEDRINE SULFATE-NACL 50-0.9 MG/10ML-% IV SOSY
PREFILLED_SYRINGE | INTRAVENOUS | Status: DC | PRN
  Administered 2020-06-10: 10 mg via INTRAVENOUS

## 2020-06-10 MED ORDER — ENOXAPARIN SODIUM 40 MG/0.4ML ~~LOC~~ SOLN
40.0000 mg | SUBCUTANEOUS | Status: DC
  Administered 2020-06-11 – 2020-06-16 (×6): 40 mg via SUBCUTANEOUS
  Filled 2020-06-10 (×5): qty 0.4

## 2020-06-10 MED ORDER — ROCURONIUM BROMIDE 10 MG/ML (PF) SYRINGE
PREFILLED_SYRINGE | INTRAVENOUS | Status: DC | PRN
  Administered 2020-06-10: 10 mg via INTRAVENOUS
  Administered 2020-06-10: 20 mg via INTRAVENOUS
  Administered 2020-06-10: 60 mg via INTRAVENOUS
  Administered 2020-06-10: 10 mg via INTRAVENOUS

## 2020-06-10 MED ORDER — INFLUENZA VAC A&B SA ADJ QUAD 0.5 ML IM PRSY
0.5000 mL | PREFILLED_SYRINGE | INTRAMUSCULAR | Status: DC
  Filled 2020-06-10: qty 0.5

## 2020-06-10 MED ORDER — HYDROMORPHONE HCL 1 MG/ML IJ SOLN
0.5000 mg | INTRAMUSCULAR | Status: DC | PRN
Start: 2020-06-10 — End: 2020-06-16
  Administered 2020-06-10: 0.5 mg via INTRAVENOUS
  Filled 2020-06-10: qty 0.5

## 2020-06-10 SURGICAL SUPPLY — 100 items
BARRIER SKIN 2 3/4 (OSTOMY) ×2 IMPLANT
BLADE EXTENDED COATED 6.5IN (ELECTRODE) IMPLANT
CANNULA REDUC XI 12-8 STAPL (CANNULA) ×2
CANNULA REDUCER 12-8 DVNC XI (CANNULA) ×1 IMPLANT
CATH ROBINSON RED A/P 16FR (CATHETERS) ×2 IMPLANT
CELLS DAT CNTRL 66122 CELL SVR (MISCELLANEOUS) IMPLANT
COVER SURGICAL LIGHT HANDLE (MISCELLANEOUS) ×4 IMPLANT
COVER TIP SHEARS 8 DVNC (MISCELLANEOUS) ×1 IMPLANT
COVER TIP SHEARS 8MM DA VINCI (MISCELLANEOUS) ×2
COVER WAND RF STERILE (DRAPES) ×2 IMPLANT
DECANTER SPIKE VIAL GLASS SM (MISCELLANEOUS) ×2 IMPLANT
DERMABOND ADVANCED (GAUZE/BANDAGES/DRESSINGS) ×1
DERMABOND ADVANCED .7 DNX12 (GAUZE/BANDAGES/DRESSINGS) ×1 IMPLANT
DRAIN CHANNEL 19F RND (DRAIN) ×2 IMPLANT
DRAPE ARM DVNC X/XI (DISPOSABLE) ×4 IMPLANT
DRAPE COLUMN DVNC XI (DISPOSABLE) ×1 IMPLANT
DRAPE DA VINCI XI ARM (DISPOSABLE) ×8
DRAPE DA VINCI XI COLUMN (DISPOSABLE) ×2
DRAPE SURG IRRIG POUCH 19X23 (DRAPES) ×2 IMPLANT
DRSG OPSITE POSTOP 4X10 (GAUZE/BANDAGES/DRESSINGS) IMPLANT
DRSG OPSITE POSTOP 4X6 (GAUZE/BANDAGES/DRESSINGS) IMPLANT
DRSG OPSITE POSTOP 4X8 (GAUZE/BANDAGES/DRESSINGS) IMPLANT
ELECT PENCIL ROCKER SW 15FT (MISCELLANEOUS) ×2 IMPLANT
ELECT REM PT RETURN 15FT ADLT (MISCELLANEOUS) ×2 IMPLANT
ENDOLOOP SUT PDS II  0 18 (SUTURE)
ENDOLOOP SUT PDS II 0 18 (SUTURE) IMPLANT
EVACUATOR SILICONE 100CC (DRAIN) ×2 IMPLANT
GLOVE SURG ENC MOIS LTX SZ6.5 (GLOVE) ×6 IMPLANT
GLOVE SURG UNDER POLY LF SZ7 (GLOVE) ×4 IMPLANT
GOWN STRL REUS W/TWL LRG LVL3 (GOWN DISPOSABLE) ×8 IMPLANT
GOWN STRL REUS W/TWL XL LVL3 (GOWN DISPOSABLE) ×8 IMPLANT
GRASPER SUT TROCAR 14GX15 (MISCELLANEOUS) IMPLANT
HOLDER FOLEY CATH W/STRAP (MISCELLANEOUS) ×2 IMPLANT
IRRIG SUCT STRYKERFLOW 2 WTIP (MISCELLANEOUS) ×2
IRRIGATION SUCT STRKRFLW 2 WTP (MISCELLANEOUS) ×1 IMPLANT
KIT PROCEDURE DA VINCI SI (MISCELLANEOUS) ×2
KIT PROCEDURE DVNC SI (MISCELLANEOUS) ×1 IMPLANT
KIT SIGMOIDOSCOPE (SET/KITS/TRAYS/PACK) ×2 IMPLANT
KIT TURNOVER KIT A (KITS) ×2 IMPLANT
NEEDLE INSUFFLATION 14GA 120MM (NEEDLE) ×2 IMPLANT
PACK CARDIOVASCULAR III (CUSTOM PROCEDURE TRAY) ×2 IMPLANT
PACK COLON (CUSTOM PROCEDURE TRAY) ×2 IMPLANT
PAD POSITIONING PINK XL (MISCELLANEOUS) ×2 IMPLANT
PORT LAP GEL ALEXIS MED 5-9CM (MISCELLANEOUS) IMPLANT
POUCH OSTOMY 2 3/4  H 3804 (WOUND CARE) ×2
POUCH OSTOMY 2 PC DRNBL 2.75 (WOUND CARE) ×1 IMPLANT
RELOAD STAPLER 3.5X60 BLU DVNC (STAPLE) IMPLANT
RELOAD STAPLER 4.3X45 GRN DVNC (STAPLE) ×2 IMPLANT
RELOAD STAPLER 4.3X60 GRN DVNC (STAPLE) ×2 IMPLANT
RTRCTR WOUND ALEXIS 18CM MED (MISCELLANEOUS)
RTRCTR WOUND ALEXIS 18CM SML (INSTRUMENTS) ×2
SAVER CELL AAL HAEMONETICS (INSTRUMENTS) ×1 IMPLANT
SCISSORS LAP 5X35 DISP (ENDOMECHANICALS) IMPLANT
SEAL CANN UNIV 5-8 DVNC XI (MISCELLANEOUS) ×4 IMPLANT
SEAL XI 5MM-8MM UNIVERSAL (MISCELLANEOUS) ×8
SEALER VESSEL DA VINCI XI (MISCELLANEOUS) ×2
SEALER VESSEL EXT DVNC XI (MISCELLANEOUS) ×1 IMPLANT
SOLUTION ELECTROLUBE (MISCELLANEOUS) ×2 IMPLANT
SPONGE DRAIN TRACH 4X4 STRL 2S (GAUZE/BANDAGES/DRESSINGS) ×2 IMPLANT
STAPLER 45 DA VINCI SURE FORM (STAPLE) ×2
STAPLER 45 SUREFORM DVNC (STAPLE) ×1 IMPLANT
STAPLER 60 DA VINCI SURE FORM (STAPLE) ×2
STAPLER 60 SUREFORM DVNC (STAPLE) ×1 IMPLANT
STAPLER CANNULA SEAL DVNC XI (STAPLE) IMPLANT
STAPLER CANNULA SEAL XI (STAPLE)
STAPLER ECHELON POWER CIR 29 (STAPLE) ×2 IMPLANT
STAPLER ECHELON POWER CIR 31 (STAPLE) IMPLANT
STAPLER RELOAD 3.5X60 BLU DVNC (STAPLE)
STAPLER RELOAD 3.5X60 BLUE (STAPLE)
STAPLER RELOAD 4.3X45 GREEN (STAPLE) ×4
STAPLER RELOAD 4.3X45 GRN DVNC (STAPLE) ×2
STAPLER RELOAD 4.3X60 GREEN (STAPLE) ×4
STAPLER RELOAD 4.3X60 GRN DVNC (STAPLE) ×2
STOPCOCK 4 WAY LG BORE MALE ST (IV SETS) ×4 IMPLANT
SUT ETHILON 2 0 PS N (SUTURE) ×2 IMPLANT
SUT NOVA NAB DX-16 0-1 5-0 T12 (SUTURE) ×4 IMPLANT
SUT PROLENE 2 0 KS (SUTURE) ×2 IMPLANT
SUT SILK 2 0 (SUTURE) ×2
SUT SILK 2 0 SH CR/8 (SUTURE) ×2 IMPLANT
SUT SILK 2-0 18XBRD TIE 12 (SUTURE) ×1 IMPLANT
SUT SILK 3 0 (SUTURE)
SUT SILK 3 0 SH CR/8 (SUTURE) ×2 IMPLANT
SUT SILK 3-0 18XBRD TIE 12 (SUTURE) IMPLANT
SUT V-LOC BARB 180 2/0GR6 GS22 (SUTURE)
SUT VIC AB 2-0 SH 18 (SUTURE) ×4 IMPLANT
SUT VIC AB 2-0 SH 27 (SUTURE)
SUT VIC AB 2-0 SH 27X BRD (SUTURE) IMPLANT
SUT VIC AB 3-0 SH 18 (SUTURE) IMPLANT
SUT VIC AB 4-0 PS2 27 (SUTURE) ×4 IMPLANT
SUT VICRYL 0 UR6 27IN ABS (SUTURE) ×2 IMPLANT
SUTURE V-LC BRB 180 2/0GR6GS22 (SUTURE) IMPLANT
SYR 10ML ECCENTRIC (SYRINGE) ×2 IMPLANT
SYS LAPSCP GELPORT 120MM (MISCELLANEOUS)
SYSTEM LAPSCP GELPORT 120MM (MISCELLANEOUS) IMPLANT
TOWEL OR 17X26 10 PK STRL BLUE (TOWEL DISPOSABLE) IMPLANT
TOWEL OR NON WOVEN STRL DISP B (DISPOSABLE) ×2 IMPLANT
TRAY FOLEY MTR SLVR 16FR STAT (SET/KITS/TRAYS/PACK) ×2 IMPLANT
TROCAR ADV FIXATION 5X100MM (TROCAR) ×2 IMPLANT
TUBING CONNECTING 10 (TUBING) ×4 IMPLANT
TUBING INSUFFLATION 10FT LAP (TUBING) ×2 IMPLANT

## 2020-06-10 NOTE — Transfer of Care (Signed)
Immediate Anesthesia Transfer of Care Note  Patient: Jared White  Procedure(s) Performed: XI ROBOTIC ASSISTED LOWER ANTERIOR RESECTION, MOBILIZATION OF SPLENIC FLEXURE, RIGID PROCTOSCOPY (N/A Abdomen) DIVERTING LOOP ILEOSTOMY (N/A Abdomen)  Patient Location: PACU  Anesthesia Type:General  Level of Consciousness: awake  Airway & Oxygen Therapy: Patient Spontanous Breathing and Patient connected to face mask oxygen  Post-op Assessment: Report given to RN, Post -op Vital signs reviewed and stable and Patient moving all extremities X 4  Post vital signs: Reviewed and stable  Last Vitals:  Vitals Value Taken Time  BP 131/72 06/10/20 1241  Temp    Pulse 62 06/10/20 1243  Resp 14 06/10/20 1243  SpO2 100 % 06/10/20 1243  Vitals shown include unvalidated device data.  Last Pain:  Vitals:   06/10/20 0650  TempSrc: Oral         Complications: No complications documented.

## 2020-06-10 NOTE — Anesthesia Procedure Notes (Signed)
Arterial Line Insertion Start/End1/19/2022 8:39 AM, 06/10/2020 8:44 AM Performed by: Brennan Bailey, MD, Niel Hummer, CRNA, CRNA  Patient location: OR. Preanesthetic checklist: patient identified, IV checked, site marked, risks and benefits discussed, surgical consent, monitors and equipment checked and pre-op evaluation Left, radial was placed Catheter size: 20 G Hand hygiene performed  and maximum sterile barriers used   Attempts: 1 Procedure performed without using ultrasound guided technique. Following insertion, Biopatch and dressing applied. Post procedure assessment: normal  Patient tolerated the procedure well with no immediate complications.

## 2020-06-10 NOTE — Op Note (Signed)
06/10/2020  12:22 PM  PATIENT:  Jared White  75 y.o. male  Patient Care Team: [name], MD as PCP - General (Family Medicine) Delrae Rend, MD as Consulting Physician (Endocrinology) [place], [place] as Attending Physician (Neurosurgery)  PRE-OPERATIVE DIAGNOSIS:  rectal cancer  POST-OPERATIVE DIAGNOSIS:  rectal cancer  PROCEDURE:   XI ROBOTIC ASSISTED LOWER ANTERIOR RESECTION, MOBILIZATION OF SPLENIC FLEXURE, DIVERTING LOOP ILEOSTOMY, INTRAOPERATIVE ASSESSMENT OF PERFUSION   Surgeon(s): Leighton Ruff, MD Ileana Roup, MD  ASSISTANT: Dr Dema Severin   ANESTHESIA:   local and general  EBL: 135ml  Total I/O In: 1100 [I.V.:1000; IV Piggyback:100] Out: 300 [Urine:200; Blood:100]  Delay start of Pharmacological VTE agent (>24hrs) due to surgical blood loss or risk of bleeding:  no  DRAINS: (68F) Jackson-Pratt drain(s) with closed bulb suction in the pelvis   SPECIMEN:  Source of Specimen:  Rectosigmoid colon, additional distal margin, final distal margin  DISPOSITION OF SPECIMEN:  PATHOLOGY  COUNTS:  YES  PLAN OF CARE: Admit to inpatient   PATIENT DISPOSITION:  PACU - hemodynamically stable.  INDICATION:      I recommended segmental resection:  The anatomy & physiology of the digestive tract was discussed.  The pathophysiology was discussed.  Natural history risks without surgery was discussed.   I worked to give an overview of the disease and the frequent need to have multispecialty involvement.  I feel the risks of no intervention will lead to serious problems that outweigh the operative risks; therefore, I recommended a partial colectomy to remove the pathology.  Laparoscopic & open techniques were discussed.   Risks such as bleeding, infection, abscess, leak, reoperation, possible ostomy, hernia, heart attack, death, and other risks were discussed.  I noted a good likelihood this will help address the problem.   Goals of  post-operative recovery were discussed as well.    The patient expressed understanding & wished to proceed with surgery.  OR FINDINGS:   Patient had scar in R posterior midline ~ 1cm proximal to anal canal  No obvious metastatic disease on visceral parietal peritoneum or liver.  The anastomosis rests 1 cm from the anal verge by rigid proctoscopy.  DESCRIPTION:   Informed consent was confirmed.  The patient underwent general anaesthesia without difficulty.  The patient was positioned appropriately.  VTE prevention in place.  The patient's abdomen was clipped, prepped, & draped in a sterile fashion.  Surgical timeout confirmed our plan.  The patient was positioned in reverse Trendelenburg.  Abdominal entry was gained using a Varies needle in the LUQ.  Entry was clean.  I induced carbon dioxide insufflation.  An 22mm robotic port was placed in the RUQ.  Camera inspection revealed no injury.  Extra ports were carefully placed under direct laparoscopic visualization.  I laparoscopically reflected the greater omentum and the upper abdomen the small bowel in the upper abdomen. The patient was appropriately positioned and the robot was docked to the patient's left side.  Instruments were placed under direct visualization.    I mobilized the sigmoid colon off of the pelvic sidewall. There were signs of previous diverticulitis and adhesions to the pelvic wall on the left side.   Once this was taken down, I scored the base of peritoneum of the right side of the mesentery of the left colon from the ligament of Treitz to the peritoneal reflection of the mid rectum.   I elevated the sigmoid mesentery and enetered into the retro-mesenteric plane. We were able  to identify the left ureter and gonadal vessels. We kept those posterior within the retroperitoneum and elevated the left colon mesentery off that. I did isolated IMA pedicle but did not ligate it yet.  I continued distally and got into the avascular plane  posterior to the mesorectum. This allowed me to help mobilize the rectum as well by freeing the mesorectum off the sacrum.  I mobilized the peritoneal coverings towards the peritoneal reflection on both the right and left sides of the rectum.  I could see the right and left ureters and stayed away from them.    I skeletonized the inferior mesenteric artery pedicle.  I went down to its takeoff from the aorta.   I isolated the inferior mesenteric vein off of the ligament of Treitz just cephalad to that as well.  After confirming the left ureter was out of the way, I went ahead and ligated the inferior mesenteric artery pedicle with bipolar robotic vessel sealer ~2cm above its takeoff from the aorta.   I did ligate the inferior mesenteric vein in a similar fashion.  We ensured hemostasis.  I continued my dissection posteriorly down the mesorectal plane using blunt dissection and the vessel sealer.  I divided the lateral edges of the mesorectal plane also using the robotic vessel sealer.  I then divided the remaining peritoneal coverings and entered into the plane between the anterior rectum and prostate.  I bluntly mobilized down this plane and divided with the robotic vessel sealer.  I continued down to the pelvic floor circumferentially.  I then checked with digital rectal exam to confirm that we were below the area of scar noted on previous exam.  Once this was freed down to this level I used a robotic green load stapler to staple off the proximal rectum.  This was felt to be slightly more proximal than I had wished and therefore I used a robotic 45 mm green load to divide more distally into fires.  This was sent to pathology as additional distal rectal margin.  I then divided the mesentery proximal to an area of most likely prior diverticulitis.  This was done using the robotic vessel sealer.  This was approximately the descending sigmoid colon junction.  I then mobilized the left colon in a lateral to medial  fashion off the line of Toldt up towards the splenic flexure to ensure good mobilization of the left colon to reach into the pelvis.  The colon was reflected into the pelvis and I took down the splenic flexure using the robotic vessel sealer.  Care was taken not to injure any retroperitoneal structures or the colon itself.  I divided the omentum away from the transverse colon to allow for mobilization down into the pelvis.  Intraoperative perfusion was then checked using firefly.  There was good perfusion down to the level of the previously dissected mesentery of the descending colon and sigmoid junction.  The colon reached into the pelvis without difficulty.  Hemostasis was good.  The robot was then undocked at this time.  The 12 mm right lower quadrant port site was enlarged into an ostomy site.  A small Alexis wound protector was placed.  The distal rectum was brought out through this site and transected at the previously dissected margin over a pursestring device.  A 2 0 pursestring was placed and the 29 mm EEA anvil was placed in the colon and the pursestring was tied tightly around this.  This was then placed back into the  abdomen.  The EEA stapler was inserted into the rectum and an anastomosis was created under laparoscopic visualization.  There was no tension on the anastomosis.  The staple line felt intact and was approximately 1 cm from the proximal anal sphincter complex.  A 19 [place] Blake drain was placed into the pelvis and brought out through the right lower quadrant 5 mm port site incision.  This was secured with a 2-0 nylon suture.  A portion of the terminal ileum was identified and secured.  We then switched to clean gowns, gloves, instruments and drapes.  The [place] wound protector was removed.  The terminal ileum was brought out through this previously created ostomy site and a red rubber catheter was placed into the mesentery under the terminal ileum.  This was secured with a 2-0 Vicryl  suture.  The remaining port sites were closed using 4-0 Vicryl sutures and Dermabond.  The loop ileostomy was then matured in standard [place] fashion using interrupted 2-0 Vicryl sutures.  An ostomy appliance was then placed.  The patient was then awakened from anesthesia and sent to the postanesthesia care unit in stable condition.  All counts were correct per operating room staff.  An MD assistant was necessary for tissue manipulation, retraction and positioning due to the complexity of the case and hospital policies

## 2020-06-10 NOTE — H&P (Signed)
  The patient is a 75 year old male who presents with colorectal cancer.75 year old male who presents to the office with a diagnosis of rectal cancer seen on colonoscopy in March. He has been undergoing total neoadjuvant treatment with Dr. Alen Blew. He is scheduled for a follow-up colonoscopy later this month and then has plans to start radiation at the end of the month. Follow-up CT scans show a slightly enlarged mass but no signs of metastatic disease. His CEA levels have been normal. He has completed total neoadjuvant therapy and his final radiation treatment was 03/25/2020.   Problem List/Past Medical  RECTAL CANCER (C20)  Past Surgical History Bypass Surgery for Poor Blood Flow to Legs Colon Polyp Removal - Colonoscopy  Allergies  No Known Drug Allergies [01/30/2020]: Allergies Reconciled  Medication History  Atenolol (50MG  Tablet, Oral) Active. Famotidine (20MG  Tablet, Oral) Active. Nitroglycerin (0.4MG  Tab Sublingual, Sublingual) Active. Rosuvastatin Calcium (20MG  Tablet, Oral) Active. Valsartan (160MG  Tablet, Oral) Active. Medications Reconciled Lidocaine-Prilocaine (2.5-2.5% Cream, External) Active. Prochlorperazine Maleate (10MG  Tablet, Oral) Active. Omeprazole (40MG  Capsule DR, Oral) Active. Aspirin (81MG  Tablet Chewable, Oral) Active. Cyanocobalamin (1000MCG Tablet Chewable, Oral) Active.  Other Problems Leighton Ruff, MD; 50/01/3817 9:20 AM) Back Pain Chronic Renal Failure Syndrome Congestive Heart Failure Gastroesophageal Reflux Disease Kidney Stone Myocardial infarction Rectal Cancer Vascular Disease   BP 133/71   Temp 98.8 F (37.1 C) (Oral)   SpO2 99%     Physical Exam   General Mental Status-Alert. General Appearance-Cooperative. CV: RRR Lungs: CTA Abdomen Palpation/Percussion Palpation and Percussion of the abdomen reveal - Soft.    Assessment & Plan  RECTAL CANCER (C20) Impression:  75 year old male with a distal rectal cancer and significant perirectal metastatic disease. He is status post total neoadjuvant therapy. He completed his radiation in early November 2021. CT scan done in September, does show a relatively large posterior perirectal lymph node. This does not appear to violate the mesorectal plane and the scan was done pre-radiation. I have recommended a robotic-assisted low anterior resection with diverting ileostomy. We discussed the need for probable IV fluids for the first couple weeks after surgery due to his renal insufficiency. We will also get cardiac clearance prior to surgery. The surgery and anatomy were described to the patient as well as the risks of surgery and the possible complications. These include: Bleeding, deep abdominal infections and possible wound complications such as hernia and infection, damage to adjacent structures, leak of surgical connections, which can lead to other surgeries and possibly an ostomy, possible need for other procedures, such as abscess drains in radiology, possible prolonged hospital stay, possible diarrhea from removal of part of the colon, possible constipation from narcotics, possible bowel, bladder or sexual dysfunction if having rectal surgery, prolonged fatigue/weakness or appetite loss, possible early recurrence of of disease, possible complications of their medical problems such as heart disease or arrhythmias or lung problems, death (less than 1%). I believe the patient understands and wishes to proceed with the surgery.

## 2020-06-10 NOTE — Anesthesia Procedure Notes (Signed)
Procedure Name: Intubation Date/Time: 06/10/2020 8:37 AM Performed by: Niel Hummer, CRNA Pre-anesthesia Checklist: Patient identified, Emergency Drugs available, Suction available and Patient being monitored Patient Re-evaluated:Patient Re-evaluated prior to induction Oxygen Delivery Method: Circle system utilized Preoxygenation: Pre-oxygenation with 100% oxygen Induction Type: IV induction Ventilation: Mask ventilation without difficulty Laryngoscope Size: Mac and 4 Grade View: Grade I Tube type: Oral Tube size: 7.5 mm Number of attempts: 1 Airway Equipment and Method: Stylet Placement Confirmation: ETT inserted through vocal cords under direct vision,  positive ETCO2 and breath sounds checked- equal and bilateral Secured at: 22 cm Tube secured with: Tape Dental Injury: Teeth and Oropharynx as per pre-operative assessment

## 2020-06-10 NOTE — Anesthesia Postprocedure Evaluation (Signed)
Anesthesia Post Note  Patient: Jared White  Procedure(s) Performed: XI ROBOTIC ASSISTED LOWER ANTERIOR RESECTION, MOBILIZATION OF SPLENIC FLEXURE, RIGID PROCTOSCOPY (N/A Abdomen) DIVERTING LOOP ILEOSTOMY (N/A Abdomen)     Patient location during evaluation: PACU Anesthesia Type: General Level of consciousness: awake and alert and oriented Pain management: pain level controlled Vital Signs Assessment: post-procedure vital signs reviewed and stable Respiratory status: spontaneous breathing, nonlabored ventilation and respiratory function stable Cardiovascular status: blood pressure returned to baseline Postop Assessment: no apparent nausea or vomiting Anesthetic complications: no   No complications documented.  Last Vitals:  Vitals:   06/10/20 1430 06/10/20 1445  BP: 118/66 122/67  Pulse: 60 62  Resp: 17 16  Temp:  (!) 36.4 C  SpO2: 99% 99%    Last Pain:  Vitals:   06/10/20 1445  TempSrc:   PainSc: [place]

## 2020-06-11 ENCOUNTER — Encounter (HOSPITAL_COMMUNITY): Payer: Self-pay | Admitting: General Surgery

## 2020-06-11 LAB — CBC
HCT: 30.8 % — ABNORMAL LOW (ref 39.0–52.0)
Hemoglobin: 9.9 g/dL — ABNORMAL LOW (ref 13.0–17.0)
MCH: 33 pg (ref 26.0–34.0)
MCHC: 32.1 g/dL (ref 30.0–36.0)
MCV: 102.7 fL — ABNORMAL HIGH (ref 80.0–100.0)
Platelets: 92 10*3/uL — ABNORMAL LOW (ref 150–400)
RBC: 3 MIL/uL — ABNORMAL LOW (ref 4.22–5.81)
RDW: 13.5 % (ref 11.5–15.5)
WBC: 8.3 10*3/uL (ref 4.0–10.5)
nRBC: 0 % (ref 0.0–0.2)

## 2020-06-11 LAB — BASIC METABOLIC PANEL
Anion gap: 8 (ref 5–15)
BUN: 22 mg/dL (ref 8–23)
CO2: 23 mmol/L (ref 22–32)
Calcium: 7.8 mg/dL — ABNORMAL LOW (ref 8.9–10.3)
Chloride: 105 mmol/L (ref 98–111)
Creatinine, Ser: 1.48 mg/dL — ABNORMAL HIGH (ref 0.61–1.24)
GFR, Estimated: 49 mL/min — ABNORMAL LOW (ref >60)
Glucose, Bld: 137 mg/dL — ABNORMAL HIGH (ref 70–99)
Potassium: 4.1 mmol/L (ref 3.5–5.1)
Sodium: 136 mmol/L (ref 135–145)

## 2020-06-11 MED ORDER — MENTHOL 3 MG MT LOZG
1.0000 | LOZENGE | OROMUCOSAL | Status: DC | PRN
  Filled 2020-06-11 (×2): qty 9

## 2020-06-11 MED ORDER — CHLORHEXIDINE GLUCONATE CLOTH 2 % EX PADS
6.0000 | MEDICATED_PAD | Freq: Every day | CUTANEOUS | Status: DC
  Administered 2020-06-11 – 2020-06-12 (×2): 6 via TOPICAL

## 2020-06-11 NOTE — Consult Note (Addendum)
[place] Nurse ostomy consult note Pt had ileostomy surgery performed on 1/19 Stoma type/location: Stoma is red and viable, 1 1/4 inches, slightly above skin level, red rubber rod in place Peristomal assessment: intact skin surrounding.  Patchy areas of red dry partial thickness skin loss where previous post-op skin stripping occurred to inner abd related to tape and skin swelling.  Avoided placing further adhesive over this location and left open to air to promote healing.  Output: small amt liquid brown stool in the pouch  Ostomy pouching: 2pc.  Education provided: Demonstrated pouch change.  Pt assisted with appliction steps using a hand held mirror.  Applied barrier ring to attempt to maintain a seal and 2 piece pouch. Kellie Simmering # [phone], # [phone]) Pt asked appropriate questions and was able to open and close velcro without assistance. Discussed importance of avoiding dehydration and dietary precautions.  Reviewed pouching routines and ordering supplies. 5 sets of barrier rings/wafers/pouches left at the bedside, along with educational materials.  Enrolled patient in [place] program: Yes Julien Girt MSN, RN, [place], [place], [place]

## 2020-06-11 NOTE — Plan of Care (Signed)
  Problem: Skin Integrity: Goal: Demonstration of wound healing without infection will improve Outcome: Progressing   

## 2020-06-11 NOTE — Progress Notes (Signed)
Patient has been up to chair since start of shift, encouraged to ambulate multiple times but patient has refused stating " I'd like to wait until closer to morning", educated patient on importance of walking as soon as possible after surgery, pain minimal since previous dose of tramadol given, colostomy with red stoma and dark brown liquid output, right side JP drain with bloody drainage, port sites CDI, foley patent, tolerating clears, no N/V, hypo bsx4, had some residual leakage from rectum but no gas passed, vss, will continue to monitor.

## 2020-06-11 NOTE — Progress Notes (Signed)
1 Day Post-Op robotic LAR, coloanal anastomosis, diverting ileostomy Subjective:  Pain controlled, tolerating fulls, having ileostomy output  Objective: Vital signs in last 24 hours: Temp:  [95.2 F (35.1 C)-98.6 F (37 C)] 97.5 F (36.4 C) (01/20 0552) Pulse Rate:  [55-71] 55 (01/20 0552) Resp:  [13-18] 16 (01/20 0552) BP: (95-132)/(48-74) 95/48 (01/20 0552) SpO2:  [98 %-100 %] 100 % (01/20 0552) Arterial Line BP: (118-147)/(61-101) 147/89 (01/19 1345) Weight:  [70.8 kg] 70.8 kg (01/20 0137)   Intake/Output from previous day: 01/19 0701 - 01/20 0700 In: 3180 [P.O.:480; I.V.:2500; IV Piggyback:200] Out: 2470 [Urine:1100; Drains:1170; Stool:100; Blood:100] Intake/Output this shift: No intake/output data recorded.  General appearance: alert and cooperative GI: normal findings: soft, non-tender, nondistended Incision: no significant drainage  Lab Results:  Recent Labs    06/11/20 0529  WBC 8.3  HGB 9.9*  HCT 30.8*  PLT 92*   BMET Recent Labs    06/11/20 0529  NA 136  K 4.1  CL 105  CO2 23  GLUCOSE 137*  BUN 22  CREATININE 1.48*  CALCIUM 7.8*   PT/INR No results for input(s): LABPROT, INR in the last 72 hours. ABG No results for input(s): PHART, HCO3 in the last 72 hours.  Invalid input(s): PCO2, PO2  MEDS, Scheduled . acetaminophen  1,000 mg Oral Q6H  . alvimopan  12 mg Oral BID  . atenolol  50 mg Oral Daily  . Chlorhexidine Gluconate Cloth  6 each Topical Daily  . enoxaparin (LOVENOX) injection  40 mg Subcutaneous Q24H  . feeding supplement  237 mL Oral BID BM  . gabapentin  300 mg Oral BID  . influenza vaccine adjuvanted  0.5 mL Intramuscular Tomorrow-1000  . pantoprazole  80 mg Oral Daily  . saccharomyces boulardii  250 mg Oral BID    Studies/Results: No results found.  Assessment: s/p Procedure(s): XI ROBOTIC ASSISTED LOWER ANTERIOR RESECTION, MOBILIZATION OF SPLENIC FLEXURE, RIGID PROCTOSCOPY DIVERTING LOOP ILEOSTOMY Patient Active  Problem List   Diagnosis Date Noted  . Preop cardiovascular exam 03/19/2020  . Gastric polyps 02/07/2020  . History of rectal cancer 02/07/2020  . History of colonic polyps 02/07/2020  . Barrett's esophagus without dysplasia 02/07/2020  . Port-A-Cath in place 11/27/2019  . Weight loss 11/27/2019  . Rectal cancer ([place]) 08/19/2019  . Abnormal MRI, pelvis 07/26/2019  . Abnormal CT scan, pelvis 07/26/2019  . Rectal bleeding 07/26/2019  . Hemorrhoids 07/26/2019  . Constipation 07/26/2019  . Stage 3b chronic kidney disease ([place]) 09/13/2018  . Coronary artery disease involving native coronary artery of native heart without angina pectoris 09/13/2018  . Microalbuminuria 10/16/2015  . Facial numbness   . Hypomagnesemia   . Hypocalcemia 10/08/2015  . Psoriasis 11/21/2013  . S/P CABG x 4 03/14/2013  . Multiple thoracic/lumbar transverse process fractures 12/15/2012  . Fall from roof 12/15/2012  . CAD (coronary artery disease) 12/15/2012  . GERD (gastroesophageal reflux disease) 12/15/2012  . Essential hypertension 12/15/2012  . Hyperlipidemia 12/15/2012  . Multiple bilateral rib fractures       Plan: Advance diet to soft foods Ambulate in hall, appreciate nursing encouragement to ambulate  Cont IVF's for now D/c foley in AM   LOS: 1 day     .Rosario Adie, MD [facility] Surgery, [place]    06/11/2020 7:48 AM

## 2020-06-12 LAB — CBC
HCT: 30.6 % — ABNORMAL LOW (ref 39.0–52.0)
Hemoglobin: 9.6 g/dL — ABNORMAL LOW (ref 13.0–17.0)
MCH: 33.1 pg (ref 26.0–34.0)
MCHC: 31.4 g/dL (ref 30.0–36.0)
MCV: 105.5 fL — ABNORMAL HIGH (ref 80.0–100.0)
Platelets: 79 10*3/uL — ABNORMAL LOW (ref 150–400)
RBC: 2.9 MIL/uL — ABNORMAL LOW (ref 4.22–5.81)
RDW: 13.6 % (ref 11.5–15.5)
WBC: 5.9 10*3/uL (ref 4.0–10.5)
nRBC: 0 % (ref 0.0–0.2)

## 2020-06-12 LAB — BASIC METABOLIC PANEL
Anion gap: 6 (ref 5–15)
BUN: 20 mg/dL (ref 8–23)
CO2: 24 mmol/L (ref 22–32)
Calcium: 7.9 mg/dL — ABNORMAL LOW (ref 8.9–10.3)
Chloride: 110 mmol/L (ref 98–111)
Creatinine, Ser: 1.51 mg/dL — ABNORMAL HIGH (ref 0.61–1.24)
GFR, Estimated: 48 mL/min — ABNORMAL LOW (ref >60)
Glucose, Bld: 128 mg/dL — ABNORMAL HIGH (ref 70–99)
Potassium: 4.2 mmol/L (ref 3.5–5.1)
Sodium: 140 mmol/L (ref 135–145)

## 2020-06-12 MED ORDER — CALCIUM POLYCARBOPHIL 625 MG PO TABS
625.0000 mg | ORAL_TABLET | Freq: Three times a day (TID) | ORAL | Status: DC
  Administered 2020-06-12 – 2020-06-16 (×13): 625 mg via ORAL
  Filled 2020-06-12 (×14): qty 1

## 2020-06-12 NOTE — Progress Notes (Signed)
During 5 am rounds, patient stated he had chest pain and pain down left arm along with some tingling during the night. Patient denied these symptoms during the 5 am rounding time. Offered patient Nitroglycerin, but patient refused. EKG was done. One result indicated Normal Sinus Rhythm and another result indicated Sinus Loletha Grayer. Patient is alert and oriented and denies pain at this time. Will continue to monitor. Advised patient to press the call button if he feels any chest pain coming on. EKG results going into patient's chart.

## 2020-06-12 NOTE — Consult Note (Addendum)
[place] Nurse ostomy consult note Pt had ileostomy surgery performed on 1/19 Stoma type/location: Stoma is red and viable, 1 1/4 inches, slightly above skin level, red rubber rod in place Peristomal assessment: intact skin surrounding.  Patchy areas of red scabs surrounding outer pouching area. Output: mod amt liquid brown stool in the pouch  Ostomy pouching: 2pc.  Education provided: Demonstrated pouch change.  Pt assisted with appliction steps using a hand held mirror.  Applied barrier ring to attempt to maintain a seal and 2 piece pouch. Pt asked appropriate questions and was able to open and close velcro without assistance. Reviewed importance of avoiding dehydration and dietary precautions and pouching routines and ordering supplies. 5 sets of barrier rings/wafers/pouches left at the bedside, along with educational materials.  Enrolled patient in [place] program: Yes, previously Jared Girt MSN, [place], [place], [place], [place]

## 2020-06-12 NOTE — Plan of Care (Signed)
  Problem: Education: Goal: Knowledge of General Education information will improve Description Including pain rating scale, medication(s)/side effects and non-pharmacologic comfort measures Outcome: Progressing   

## 2020-06-12 NOTE — Progress Notes (Signed)
2 Days Post-Op robotic LAR, coloanal anastomosis, diverting ileostomy Subjective:  Pain controlled, tolerating diet, having ileostomy output, had first episode of ostomy teaching  Objective: Vital signs in last 24 hours: Temp:  [97.8 F (36.6 C)-99 F (37.2 C)] 98.7 F (37.1 C) (01/21 0600) Pulse Rate:  [59-66] 59 (01/21 0600) Resp:  [15-17] 16 (01/21 0600) BP: (97-120)/(48-55) 120/54 (01/21 0600) SpO2:  [97 %-98 %] 98 % (01/21 0600)   Intake/Output from previous day: 01/20 0701 - 01/21 0700 In: 2766.5 [P.O.:480; I.V.:2286.5] Out: 3905 [Urine:2625; Drains:405; Stool:875] Intake/Output this shift: No intake/output data recorded.  General appearance: alert and cooperative GI: normal findings: soft, non-tender, nondistended Incision: no significant drainage JP: SSF Lab Results:  Recent Labs    06/11/20 0529 06/12/20 0508  WBC 8.3 5.9  HGB 9.9* 9.6*  HCT 30.8* 30.6*  PLT 92* 79*   BMET Recent Labs    06/11/20 0529 06/12/20 0508  NA 136 140  K 4.1 4.2  CL 105 110  CO2 23 24  GLUCOSE 137* 128*  BUN 22 20  CREATININE 1.48* 1.51*  CALCIUM 7.8* 7.9*   PT/INR No results for input(s): LABPROT, INR in the last 72 hours. ABG No results for input(s): PHART, HCO3 in the last 72 hours.  Invalid input(s): PCO2, PO2  MEDS, Scheduled . acetaminophen  1,000 mg Oral Q6H  . alvimopan  12 mg Oral BID  . atenolol  50 mg Oral Daily  . Chlorhexidine Gluconate Cloth  6 each Topical Daily  . enoxaparin (LOVENOX) injection  40 mg Subcutaneous Q24H  . feeding supplement  237 mL Oral BID BM  . gabapentin  300 mg Oral BID  . influenza vaccine adjuvanted  0.5 mL Intramuscular Tomorrow-1000  . pantoprazole  80 mg Oral Daily  . polycarbophil  625 mg Oral TID AC  . saccharomyces boulardii  250 mg Oral BID    Studies/Results: No results found.  Assessment: s/p Procedure(s): XI ROBOTIC ASSISTED LOWER ANTERIOR RESECTION, MOBILIZATION OF SPLENIC FLEXURE, RIGID  PROCTOSCOPY DIVERTING LOOP ILEOSTOMY Patient Active Problem List   Diagnosis Date Noted  . Preop cardiovascular exam 03/19/2020  . Gastric polyps 02/07/2020  . History of rectal cancer 02/07/2020  . History of colonic polyps 02/07/2020  . Barrett's esophagus without dysplasia 02/07/2020  . Port-A-Cath in place 11/27/2019  . Weight loss 11/27/2019  . Rectal cancer ([place]) 08/19/2019  . Abnormal MRI, pelvis 07/26/2019  . Abnormal CT scan, pelvis 07/26/2019  . Rectal bleeding 07/26/2019  . Hemorrhoids 07/26/2019  . Constipation 07/26/2019  . Stage 3b chronic kidney disease ([place]) 09/13/2018  . Coronary artery disease involving native coronary artery of native heart without angina pectoris 09/13/2018  . Microalbuminuria 10/16/2015  . Facial numbness   . Hypomagnesemia   . Hypocalcemia 10/08/2015  . Psoriasis 11/21/2013  . S/P CABG x 4 03/14/2013  . Multiple thoracic/lumbar transverse process fractures 12/15/2012  . Fall from roof 12/15/2012  . CAD (coronary artery disease) 12/15/2012  . GERD (gastroesophageal reflux disease) 12/15/2012  . Essential hypertension 12/15/2012  . Hyperlipidemia 12/15/2012  . Multiple bilateral rib fractures       Plan: Cont soft foods Ambulate in hall, appreciate nursing encouragement to ambulate  Decrease IVF's D/c foley  Fiber tab before meals, watch ostomy and uop closely Cont to hold Ace inhibitor, foley Cr    LOS: 2 days     .Rosario Adie, MD [facility], [place]    06/12/2020 8:39 AM

## 2020-06-13 LAB — CBC
HCT: 30.9 % — ABNORMAL LOW (ref 39.0–52.0)
Hemoglobin: 9.6 g/dL — ABNORMAL LOW (ref 13.0–17.0)
MCH: 32.8 pg (ref 26.0–34.0)
MCHC: 31.1 g/dL (ref 30.0–36.0)
MCV: 105.5 fL — ABNORMAL HIGH (ref 80.0–100.0)
Platelets: 92 10*3/uL — ABNORMAL LOW (ref 150–400)
RBC: 2.93 MIL/uL — ABNORMAL LOW (ref 4.22–5.81)
RDW: 13.4 % (ref 11.5–15.5)
WBC: 4.5 10*3/uL (ref 4.0–10.5)
nRBC: 0 % (ref 0.0–0.2)

## 2020-06-13 LAB — BASIC METABOLIC PANEL
Anion gap: 6 (ref 5–15)
BUN: 15 mg/dL (ref 8–23)
CO2: 24 mmol/L (ref 22–32)
Calcium: 8.2 mg/dL — ABNORMAL LOW (ref 8.9–10.3)
Chloride: 110 mmol/L (ref 98–111)
Creatinine, Ser: 1.11 mg/dL (ref 0.61–1.24)
GFR, Estimated: >60 mL/min (ref >60)
Glucose, Bld: 116 mg/dL — ABNORMAL HIGH (ref 70–99)
Potassium: 3.8 mmol/L (ref 3.5–5.1)
Sodium: 140 mmol/L (ref 135–145)

## 2020-06-13 NOTE — Progress Notes (Signed)
3 Days Post-Op   Subjective/Chief Complaint: Feels fine, states his bag filled up three times yesterday (I dont think all of this is recorded), urinating, tol diet, up in room   Objective: Vital signs in last 24 hours: Temp:  [97.8 F (36.6 C)-98.2 F (36.8 C)] 97.8 F (36.6 C) (01/22 0515) Pulse Rate:  [55-68] 55 (01/22 0515) Resp:  [16] 16 (01/22 0515) BP: (124-147)/(67-102) 144/67 (01/22 0515) SpO2:  [99 %-100 %] 99 % (01/22 0515) Last BM Date: 06/12/20  Intake/Output from previous day: 01/21 0701 - 01/22 0700 In: 1910.7 [P.O.:960; I.V.:950.7] Out: 1080 [Urine:600; Drains:210; Stool:270] Intake/Output this shift: Total I/O In: 240 [P.O.:240] Out: 225 [Other:25; Stool:200]  General nad cv rrr Lungs clear Ab soft nontender nd bs present, ileostomy pink with output jp serosang  Lab Results:  Recent Labs    06/12/20 0508 06/13/20 0534  WBC 5.9 4.5  HGB 9.6* 9.6*  HCT 30.6* 30.9*  PLT 79* 92*   BMET Recent Labs    06/12/20 0508 06/13/20 0534  NA 140 140  K 4.2 3.8  CL 110 110  CO2 24 24  GLUCOSE 128* 116*  BUN 20 15  CREATININE 1.51* 1.11  CALCIUM 7.9* 8.2*   PT/INR No results for input(s): LABPROT, INR in the last 72 hours. ABG No results for input(s): PHART, HCO3 in the last 72 hours.  Invalid input(s): PCO2, PO2  Studies/Results: No results found.  Anti-infectives: Anti-infectives (From admission, onward)   Start     Dose/Rate Route Frequency Ordered Stop   06/10/20 2100  cefoTEtan (CEFOTAN) 2 g in sodium chloride 0.9 % 100 mL IVPB        2 g 200 mL/hr over 30 Minutes Intravenous Every 12 hours 06/10/20 1514 06/10/20 2320   06/10/20 0630  cefoTEtan (CEFOTAN) 2 g in sodium chloride 0.9 % 100 mL IVPB        2 g 200 mL/hr over 30 Minutes Intravenous On call to O.R. 06/10/20 0629 06/10/20 0907      Assessment/Plan: POD 3 robot lar with ileostomy -cont soft diet -ileostomy output not accurate, cr better on iv fluids, will hopefully get  better measurement next 24 hours -decrease iv fluids today, follow cr -pulm toilet -hold ace still -lovenox   Rolm Bookbinder 06/13/2020

## 2020-06-14 LAB — BASIC METABOLIC PANEL
Anion gap: 4 — ABNORMAL LOW (ref 5–15)
BUN: 15 mg/dL (ref 8–23)
CO2: 25 mmol/L (ref 22–32)
Calcium: 8.5 mg/dL — ABNORMAL LOW (ref 8.9–10.3)
Chloride: 110 mmol/L (ref 98–111)
Creatinine, Ser: 1.24 mg/dL (ref 0.61–1.24)
GFR, Estimated: >60 mL/min (ref >60)
Glucose, Bld: 101 mg/dL — ABNORMAL HIGH (ref 70–99)
Potassium: 3.7 mmol/L (ref 3.5–5.1)
Sodium: 139 mmol/L (ref 135–145)

## 2020-06-14 LAB — CBC
HCT: 30.4 % — ABNORMAL LOW (ref 39.0–52.0)
Hemoglobin: 9.8 g/dL — ABNORMAL LOW (ref 13.0–17.0)
MCH: 33.3 pg (ref 26.0–34.0)
MCHC: 32.2 g/dL (ref 30.0–36.0)
MCV: 103.4 fL — ABNORMAL HIGH (ref 80.0–100.0)
Platelets: 97 10*3/uL — ABNORMAL LOW (ref 150–400)
RBC: 2.94 MIL/uL — ABNORMAL LOW (ref 4.22–5.81)
RDW: 13.3 % (ref 11.5–15.5)
WBC: 4 10*3/uL (ref 4.0–10.5)
nRBC: 0 % (ref 0.0–0.2)

## 2020-06-14 MED ORDER — LOPERAMIDE HCL 2 MG PO CAPS
2.0000 mg | ORAL_CAPSULE | Freq: Three times a day (TID) | ORAL | Status: DC
  Administered 2020-06-14 – 2020-06-15 (×3): 2 mg via ORAL
  Filled 2020-06-14 (×3): qty 1

## 2020-06-14 NOTE — Progress Notes (Signed)
4 Days Post-Op   Subjective/Chief Complaint: tol diet, up and around, no n/v, voiding   Objective: Vital signs in last 24 hours: Temp:  [97.7 F (36.5 C)-98 F (36.7 C)] 97.7 F (36.5 C) (01/23 0647) Pulse Rate:  [54-63] 62 (01/23 0647) Resp:  [16-18] 18 (01/23 0647) BP: (112-166)/(62-79) 166/79 (01/23 0647) SpO2:  [98 %-99 %] 98 % (01/23 0900) Last BM Date: 06/14/20  Intake/Output from previous day: 01/22 0701 - 01/23 0700 In: 2494.1 [P.O.:1900; I.V.:594.1] Out: 1552 [Urine:2; Drains:275; BJYNW:2956] Intake/Output this shift: Total I/O In: 360 [P.O.:360] Out: -   General nad cv rrr Lungs clear Ab soft nontender nd bs present, ileostomy pink with output jp serosang   Lab Results:  Recent Labs    06/13/20 0534 06/14/20 0516  WBC 4.5 4.0  HGB 9.6* 9.8*  HCT 30.9* 30.4*  PLT 92* 97*   BMET Recent Labs    06/13/20 0534 06/14/20 0516  NA 140 139  K 3.8 3.7  CL 110 110  CO2 24 25  GLUCOSE 116* 101*  BUN 15 15  CREATININE 1.11 1.24  CALCIUM 8.2* 8.5*   PT/INR No results for input(s): LABPROT, INR in the last 72 hours. ABG No results for input(s): PHART, HCO3 in the last 72 hours.  Invalid input(s): PCO2, PO2  Studies/Results: No results found.  Anti-infectives: Anti-infectives (From admission, onward)   Start     Dose/Rate Route Frequency Ordered Stop   06/10/20 2100  cefoTEtan (CEFOTAN) 2 g in sodium chloride 0.9 % 100 mL IVPB        2 g 200 mL/hr over 30 Minutes Intravenous Every 12 hours 06/10/20 1514 06/10/20 2320   06/10/20 0630  cefoTEtan (CEFOTAN) 2 g in sodium chloride 0.9 % 100 mL IVPB        2 g 200 mL/hr over 30 Minutes Intravenous On call to O.R. 06/10/20 0629 06/10/20 0907      Assessment/Plan: POD 4 robot lar with ileostomy -cont soft diet -will start 2 mg imodium prior to meals today to decrease output -try to sliv today, he is taking po liquids well and cr is 1.2 today -pulm toilet -hold ace still -lovenox  -hopefully  home next 48 hours  Rolm Bookbinder 06/14/2020

## 2020-06-15 LAB — BASIC METABOLIC PANEL
Anion gap: 9 (ref 5–15)
BUN: 16 mg/dL (ref 8–23)
CO2: 23 mmol/L (ref 22–32)
Calcium: 8.4 mg/dL — ABNORMAL LOW (ref 8.9–10.3)
Chloride: 109 mmol/L (ref 98–111)
Creatinine, Ser: 1.29 mg/dL — ABNORMAL HIGH (ref 0.61–1.24)
GFR, Estimated: 58 mL/min — ABNORMAL LOW (ref >60)
Glucose, Bld: 97 mg/dL (ref 70–99)
Potassium: 3.7 mmol/L (ref 3.5–5.1)
Sodium: 141 mmol/L (ref 135–145)

## 2020-06-15 LAB — SURGICAL PATHOLOGY

## 2020-06-15 MED ORDER — LOPERAMIDE HCL 2 MG PO CAPS
2.0000 mg | ORAL_CAPSULE | Freq: Three times a day (TID) | ORAL | Status: DC
  Administered 2020-06-15 – 2020-06-16 (×6): 2 mg via ORAL
  Filled 2020-06-15 (×5): qty 1

## 2020-06-15 NOTE — Consult Note (Signed)
[employer] Nurse ostomy follow up Stoma type/location: Extended visit to reinforce teaching and ensure patient feels comfortable with pouching regimen prior to discharge.  Red rubber catheter bridge is removed without difficulty and stoma repouched with patient's assistance. A few tips for success are shared and patient is pleased with session.  Patient is independent in emptying, can apply pouch.  A belt is provided and its use demonstrated to home pouching system snuggly against abdomen. Stomal assessment/size: 1 and 3/8 inches round, raised, red, moist loop stoma. Peristomal assessment: with mild irritant peristomal contact dermatitis from effluent Treatment options for stomal/peristomal skin: skin barrier ring, belt Output: thin brown effluent Ostomy pouching: 2pc., 2 and 1/4 inch flat pouching system with skin barrier ring. Pouch is Kellie Simmering #[id number], Skin barrier is Kellie Simmering # [id number] and skin barrier ring is Kellie Simmering # [id number]. Education provided:  Patient is provided opportunity for demonstration of pouch preparation and application and needs gentle corrections for aligning stoma and pouching system. He appreciates belt and demonstration of use, also believes he will use this accessory as he is admittedly quite active around the house.  Further reinforcement of recommendation to left no more than 15 pounds during recovery is provided. Patient reiterates that he is ready for discharge and wants to care for his ostomy independently. Enrolled patient in [place] Start Discharge program: Yes. Patient reports that he has already received supplies at home.  [place] nursing team will follow while in house, and will remain available to this patient, the nursing, surgical and medical teams.   Thanks, Maudie Flakes, MSN, RN, [place], Arther Abbott  Pager# [phone]

## 2020-06-15 NOTE — Progress Notes (Signed)
5 Days Post-Op robotic LAR, coloanal anastomosis, diverting ileostomy Subjective:  Pain controlled, tolerating diet, on imodium and fiber for ileostomy output  Objective: Vital signs in last 24 hours: Temp:  [97.9 F (36.6 C)-98.5 F (36.9 C)] 98.4 F (36.9 C) (01/24 0503) Pulse Rate:  [51-63] 51 (01/24 0503) Resp:  [16-17] 16 (01/24 0503) BP: (124-144)/(63-67) 124/63 (01/24 0503) SpO2:  [97 %-99 %] 97 % (01/24 0503)   Intake/Output from previous day: 01/23 0701 - 01/24 0700 In: 6222 [P.O.:1320; I.V.:150] Out: 1040 [Drains:140; Stool:900] Intake/Output this shift: No intake/output data recorded.  General appearance: alert and cooperative GI: normal findings: soft, non-tender, nondistended Incision: no significant drainage  Lab Results:  Recent Labs    06/13/20 0534 06/14/20 0516  WBC 4.5 4.0  HGB 9.6* 9.8*  HCT 30.9* 30.4*  PLT 92* 97*   BMET Recent Labs    06/14/20 0516 06/15/20 0508  NA 139 141  K 3.7 3.7  CL 110 109  CO2 25 23  GLUCOSE 101* 97  BUN 15 16  CREATININE 1.24 1.29*  CALCIUM 8.5* 8.4*   PT/INR No results for input(s): LABPROT, INR in the last 72 hours. ABG No results for input(s): PHART, HCO3 in the last 72 hours.  Invalid input(s): PCO2, PO2  MEDS, Scheduled . acetaminophen  1,000 mg Oral Q6H  . atenolol  50 mg Oral Daily  . Chlorhexidine Gluconate Cloth  6 each Topical Daily  . enoxaparin (LOVENOX) injection  40 mg Subcutaneous Q24H  . feeding supplement  237 mL Oral BID BM  . gabapentin  300 mg Oral BID  . loperamide  2 mg Oral TID AC & HS  . pantoprazole  80 mg Oral Daily  . polycarbophil  625 mg Oral TID AC  . saccharomyces boulardii  250 mg Oral BID    Studies/Results: No results found.  Assessment: s/p Procedure(s): XI ROBOTIC ASSISTED LOWER ANTERIOR RESECTION, MOBILIZATION OF SPLENIC FLEXURE, RIGID PROCTOSCOPY DIVERTING LOOP ILEOSTOMY Patient Active Problem List   Diagnosis Date Noted  . Preop cardiovascular exam  03/19/2020  . Gastric polyps 02/07/2020  . History of rectal cancer 02/07/2020  . History of colonic polyps 02/07/2020  . Barrett's esophagus without dysplasia 02/07/2020  . Port-A-Cath in place 11/27/2019  . Weight loss 11/27/2019  . Rectal cancer ([place]) 08/19/2019  . Abnormal MRI, pelvis 07/26/2019  . Abnormal CT scan, pelvis 07/26/2019  . Rectal bleeding 07/26/2019  . Hemorrhoids 07/26/2019  . Constipation 07/26/2019  . Stage 3b chronic kidney disease ([place]) 09/13/2018  . Coronary artery disease involving native coronary artery of native heart without angina pectoris 09/13/2018  . Microalbuminuria 10/16/2015  . Facial numbness   . Hypomagnesemia   . Hypocalcemia 10/08/2015  . Psoriasis 11/21/2013  . S/P CABG x 4 03/14/2013  . Multiple thoracic/lumbar transverse process fractures 12/15/2012  . Fall from roof 12/15/2012  . CAD (coronary artery disease) 12/15/2012  . GERD (gastroesophageal reflux disease) 12/15/2012  . Essential hypertension 12/15/2012  . Hyperlipidemia 12/15/2012  . Multiple bilateral rib fractures       Plan: Cont soft foods Ambulate in hall, appreciate nursing encouragement to ambulate  SL IVF's, monitor UOP Ostomy teaching today.  Pt does not wish to have home health D/c JP prior to d/c Remove ostomy bar today D/c tom if does well with ostomy teaching   LOS: 5 days     .Rosario Adie, [place] [facility], [place]    06/15/2020 8:23 AM

## 2020-06-15 NOTE — Care Management Important Message (Signed)
Medicare important message printed for Jared Sinclair, LCSW to give to the patient. 

## 2020-06-15 NOTE — Progress Notes (Signed)
Chaplain engaged in initial visit with Jared White.  Yuan shared that he is feeling good and will be going home tomorrow.  Kaleab shared that his procedure was painless and that he has been through far worse in his lifetime.  He shared some of his testimony concerning his health with chaplain.  Chaplain noted how positive he seems and he noted that he doesn't spend time worrying.  Chaplain affirmed his outlook and philosophy.  Chaplain offered support and ministries of presence and listening.  Chaplain available to follow-up as needed.    06/15/20 1200  Clinical Encounter Type  Visited With Patient  Visit Type Initial

## 2020-06-16 ENCOUNTER — Other Ambulatory Visit (HOSPITAL_COMMUNITY): Payer: Self-pay | Admitting: General Surgery

## 2020-06-16 MED ORDER — SODIUM CHLORIDE 0.9 % IV BOLUS: 1000.0000 mL | INTRAVENOUS | 0 refills | Status: DC

## 2020-06-16 MED ORDER — LOPERAMIDE HCL 2 MG PO CAPS: 2.0000 mg | ORAL_CAPSULE | Freq: Three times a day (TID) | ORAL | 2 refills | Status: DC

## 2020-06-16 MED ORDER — CALCIUM POLYCARBOPHIL 625 MG PO TABS: 625.0000 mg | ORAL_TABLET | Freq: Three times a day (TID) | ORAL | Status: DC

## 2020-06-16 MED ORDER — ROSUVASTATIN CALCIUM 20 MG PO TABS: 20.0000 mg | ORAL_TABLET | Freq: Every day | ORAL | Status: DC

## 2020-06-16 MED FILL — LOPERAMIDE 2 MG CAPSULE: 2 | 22 days supply | Qty: 90 | Fill #0

## 2020-06-16 NOTE — Progress Notes (Signed)
  Radiation Oncology         [phone] ________________________________  Name: Brion Sossamon Hadlock MRN: [id number]  Date: 03/25/2020  DOB: [birth year]  End of Treatment Note  Diagnosis:   Rectal cancer     Indication for treatment:  Curative       Radiation treatment dates:   02/17/20 - 03/25/20  Site/dose:    The patient was treated to the pelvis to a dose of 45 Gy at 1.8 Gy per fraction. This was accomplished using a 4 field 3-D conformal technique. The patient then received a boost to the tumor and adjacent high-risk regions for an additional 5.4 Gy at 1.8 gray per fraction. This was carried out using a coned-down 4 field approach. The patient's total dose was 50.4 Gy. Daily AlignRT was used on a daily basis to insure proper patient positioning and localization of critical targets/ structures. The patient received concurrent chemotherapy during the course of radiation treatment.  Narrative: The patient tolerated radiation treatment relatively well.    Plan: The patient has completed radiation treatment. The patient will return to radiation oncology clinic for routine followup in one month. I advised the patient to call or return sooner if they have any questions or concerns related to their recovery or treatment.   ------------------------------------------------  Jodelle Gross, MD, PhD

## 2020-06-16 NOTE — Discharge Summary (Signed)
Physician Discharge Summary  Patient ID: Jared White MRN: [id number] DOB/AGE: 21-Oct-1945 75 y.o.  Admit date: 06/10/2020 Discharge date: 06/16/2020  Admission Diagnoses: Rectal cancer Discharge Diagnoses:  Active Problems:   Rectal cancer [facility])   Discharged Condition: good  Hospital Course: Patient was admitted after low anterior resection with diverting ileostomy.  He is status post total neoadjuvant chemotherapy and radiation treatment for rectal cancer.  His diet was advanced as tolerated.  Over the next few days, he began ileostomy teaching.  He was noted to have somewhat high ileostomy outputs and Imodium and fiber pills were started.  This helped to thicken the fluids and decrease output to less than 1 L per day.  Without IV fluids, his creatinine still slightly rose on this regimen.  We decided to perform IV infusions twice a week at home to help keep him hydrated and avoid worsening of his renal function.  Once this was set up, the patient was discharged.  We will follow him closely as an outpatient.  Consults: None  Significant Diagnostic Studies: labs: cbc, bmet  Treatments: IV hydration, analgesia: acetaminophen and surgery: robotic LAR  Discharge Exam: Blood pressure 132/71, pulse (!) 59, temperature 97.9 F (36.6 C), temperature source Oral, resp. rate 18, height 5\' 8"  (1.727 m), weight 70.8 kg, SpO2 98 %. General appearance: alert and cooperative GI: normal findings: soft, non-tender Incision/Wound: clean, dry, intact  Disposition:  home  Allergies as of 06/16/2020   No Known Allergies     Medication List    STOP taking these medications   silver sulfADIAZINE 1 % cream Commonly known as: Silvadene   valsartan 160 MG tablet Commonly known as: DIOVAN     TAKE these medications   ascorbic acid 500 MG tablet Commonly known as: VITAMIN C Take 500 mg by mouth daily.   aspirin EC 81 MG tablet Take 1 tablet (81 mg total) by mouth daily.   atenolol 50 MG  tablet Commonly known as: TENORMIN TAKE 1 TABLET BY MOUTH EVERY DAY   Calcium 600-200 MG-UNIT tablet Take 1 tablet by mouth daily.   loperamide 2 MG capsule Commonly known as: IMODIUM Take 1 capsule (2 mg total) by mouth 4 (four) times daily -  before meals and at bedtime.   Magnesium 250 MG Tabs Take 250 mg by mouth daily.   naproxen sodium 220 MG tablet Commonly known as: ALEVE Take 220-440 mg by mouth 2 (two) times daily as needed (aches/pain.).   nitroGLYCERIN 0.4 MG SL tablet Commonly known as: NITROSTAT Place 0.4 mg under the tongue every 5 (five) minutes x 3 doses as needed for chest pain.   omeprazole 40 MG capsule Commonly known as: PRILOSEC Take 40 mg by mouth daily before breakfast.   polycarbophil 625 MG tablet Commonly known as: FIBERCON Take 1 tablet (625 mg total) by mouth 3 (three) times daily before meals.   rosuvastatin 20 MG tablet Commonly known as: CRESTOR Take 1 tablet (20 mg total) by mouth at bedtime.   sodium chloride 0.9 % Inject 1,000 mLs into the vein every 3 (three) days.   vitamin B-12 50 MCG tablet Commonly known as: CYANOCOBALAMIN Take 50 mcg by mouth daily.   vitamin E 45 MG (100 UNITS) capsule Take 100 Units by mouth daily.       Follow-up Information    Leighton Ruff, MD. Go on 12/21/173.   Specialty: General Surgery Why: at 9am.  please arrive at 8:45 Contact information: [address]  [place] [address] [phone]               Signed: Rosario Adie 02/13/2682, 10:12 AM

## 2020-06-16 NOTE — Progress Notes (Signed)
Discharge instructions given to patient and all questions were answered.  

## 2020-06-16 NOTE — Discharge Instructions (Signed)

## 2020-06-16 NOTE — TOC Initial Note (Signed)
Transition of Care [facility]) - Initial/Assessment Note    Patient Details  Name: Jared White MRN: [id number] Date of Birth: [birth year]  [facility]) CM/SW Contact:    Lia Hopping, [place] Phone Number: 06/16/2020, 3:10 PM  Clinical Narrative:  Consult for IV infusions twice a week at home to help keep hydration and avoid worsening of his renal function    Pam Chandler [facility] IV infusion given the referral to provide the fluids.  CSW met with the patient at beside introduced role and reason for the visit-to assist with discharge plan.               CSW reached out to all the agencies in the [place] area for RN services. Agencies report no Conservation officer, historic buildings available, payor not in network, etc. CSW was in preparation to arrange needle change and IV flushes through the [facility], Spoke with Dr. Alen Blew the nurse Tammy she was to arrange.   3:11PM CSW received a message from [facility] they can accept the patient and provide RN care at home,  Dr. Alen Blew nurse updated.  Pam Chandlerw/ [employer] to see this patient this afternoon to provide teaching and coordinate visits with the RN.   RN notified.   Expected Discharge Plan: [place] Barriers to Discharge: Barriers Resolved   Patient Goals and CMS Choice Patient states their goals for this hospitalization and ongoing recovery are:: return home CMS Medicare.gov Compare Post Acute Care list provided to:: Patient Choice offered to / list presented to : Patient  Expected Discharge Plan and Services Expected Discharge Plan: [place] In-house Referral: Clinical Social Work Discharge Planning Services: CM Consult Post Acute Care Choice: [place] arrangements for the past 2 months: [place] Expected Discharge Date: 06/16/20                         HH Arranged: RN (IV [place]) [place] Agency: [place] Date [place]: 06/16/20 Time [place]: 1508 Representative spoke with at [place]: [place] Arrangements/Services Living arrangements for the past 2 months: [place] with:: Self Patient language and need for interpreter reviewed:: No Do you feel safe going back to the place where you live?: Yes      Need for Family Participation in Patient Care: Yes (Comment) (Lives alone, HHarranged to provide RN) Care giver support system in place?: No (comment)   Criminal Activity/Legal Involvement Pertinent to Current Situation/Hospitalization: No - Comment as needed  Activities of Daily Living Home Assistive Devices/Equipment: Eyeglasses ADL Screening (condition at time of admission) Patient's cognitive ability adequate to safely complete daily activities?: Yes Is the patient deaf or have difficulty hearing?: No Does the patient have difficulty seeing, even when wearing glasses/contacts?: No Does the patient have difficulty concentrating, remembering, or making decisions?: No Patient able to express need for assistance with ADLs?: Yes Does the patient have difficulty dressing or bathing?: No Independently performs ADLs?: Yes (appropriate for developmental age) Does the patient have difficulty walking or climbing stairs?: No Weakness of Legs: None Weakness of Arms/Hands: None  Permission Sought/Granted Permission sought to share information with : Facility Art therapist granted to share information with : Yes, Verbal Permission Granted     Permission granted to share info w AGENCY: [place] agency in the area        Emotional Assessment Appearance:: Appears stated  age Attitude/Demeanor/Rapport: Engaged Affect (typically observed): Pleasant Orientation: : Oriented to Situation,Oriented to  Time,Oriented to Place,Oriented to Self Alcohol / Substance Use: Not Applicable Psych Involvement: No (comment)  Admission diagnosis:  Rectal cancer ([place]) [C20] Patient Active  Problem List   Diagnosis Date Noted  . Preop cardiovascular exam 03/19/2020  . Gastric polyps 02/07/2020  . History of rectal cancer 02/07/2020  . History of colonic polyps 02/07/2020  . Barrett's esophagus without dysplasia 02/07/2020  . Port-A-Cath in place 11/27/2019  . Weight loss 11/27/2019  . Rectal cancer ([place]) 08/19/2019  . Abnormal MRI, pelvis 07/26/2019  . Abnormal CT scan, pelvis 07/26/2019  . Rectal bleeding 07/26/2019  . Hemorrhoids 07/26/2019  . Constipation 07/26/2019  . Stage 3b chronic kidney disease ([place]) 09/13/2018  . Coronary artery disease involving native coronary artery of native heart without angina pectoris 09/13/2018  . Microalbuminuria 10/16/2015  . Facial numbness   . Hypomagnesemia   . Hypocalcemia 10/08/2015  . Psoriasis 11/21/2013  . S/P CABG x 4 03/14/2013  . Multiple thoracic/lumbar transverse process fractures 12/15/2012  . Fall from roof 12/15/2012  . CAD (coronary artery disease) 12/15/2012  . GERD (gastroesophageal reflux disease) 12/15/2012  . Essential hypertension 12/15/2012  . Hyperlipidemia 12/15/2012  . Multiple bilateral rib fractures    PCP:  [name], MD Pharmacy:   [facility] [place], [place] [address] Phone: [phone] Fax: [phone]     Social Determinants of Health (SDOH) Interventions    Readmission Risk Interventions No flowsheet data found.

## 2020-06-17 DIAGNOSIS — Z432 Encounter for attention to ileostomy: Secondary | ICD-10-CM | POA: Diagnosis not present

## 2020-06-17 DIAGNOSIS — I2510 Atherosclerotic heart disease of native coronary artery without angina pectoris: Secondary | ICD-10-CM | POA: Diagnosis not present

## 2020-06-17 DIAGNOSIS — Z951 Presence of aortocoronary bypass graft: Secondary | ICD-10-CM | POA: Diagnosis not present

## 2020-06-17 DIAGNOSIS — Z95828 Presence of other vascular implants and grafts: Secondary | ICD-10-CM | POA: Diagnosis not present

## 2020-06-17 DIAGNOSIS — I129 Hypertensive chronic kidney disease with stage 1 through stage 4 chronic kidney disease, or unspecified chronic kidney disease: Secondary | ICD-10-CM | POA: Diagnosis not present

## 2020-06-17 DIAGNOSIS — K2270 Barrett's esophagus without dysplasia: Secondary | ICD-10-CM | POA: Diagnosis not present

## 2020-06-17 DIAGNOSIS — C20 Malignant neoplasm of rectum: Secondary | ICD-10-CM | POA: Diagnosis not present

## 2020-06-17 DIAGNOSIS — N1832 Chronic kidney disease, stage 3b: Secondary | ICD-10-CM | POA: Diagnosis not present

## 2020-06-19 ENCOUNTER — Other Ambulatory Visit: Payer: Self-pay | Admitting: *Deleted

## 2020-06-19 DIAGNOSIS — I129 Hypertensive chronic kidney disease with stage 1 through stage 4 chronic kidney disease, or unspecified chronic kidney disease: Secondary | ICD-10-CM | POA: Diagnosis not present

## 2020-06-19 DIAGNOSIS — N1832 Chronic kidney disease, stage 3b: Secondary | ICD-10-CM | POA: Diagnosis not present

## 2020-06-19 DIAGNOSIS — C20 Malignant neoplasm of rectum: Secondary | ICD-10-CM | POA: Diagnosis not present

## 2020-06-19 DIAGNOSIS — Z95828 Presence of other vascular implants and grafts: Secondary | ICD-10-CM | POA: Diagnosis not present

## 2020-06-19 DIAGNOSIS — Z432 Encounter for attention to ileostomy: Secondary | ICD-10-CM | POA: Diagnosis not present

## 2020-06-19 DIAGNOSIS — I2510 Atherosclerotic heart disease of native coronary artery without angina pectoris: Secondary | ICD-10-CM | POA: Diagnosis not present

## 2020-06-19 DIAGNOSIS — K2270 Barrett's esophagus without dysplasia: Secondary | ICD-10-CM | POA: Diagnosis not present

## 2020-06-19 DIAGNOSIS — Z951 Presence of aortocoronary bypass graft: Secondary | ICD-10-CM | POA: Diagnosis not present

## 2020-06-19 NOTE — Patient Outreach (Signed)
[facility]) Care Management  06/19/2020  Quanah Majka Barletta 04-Jun-1945 [id number]   EMMI- GENERAL DISCHARGE-SUCCESSFULLY RESOLVED  RED ON EMMI ALERT Day #1 Date:06/18/2020 Red Alert Reason:NO FOLLOW UP APPOINTMENT, NOT READ DISCHARGE PAPERS  OUTREACH #1 RN spoke with pt today concerning the above emmi. Pt states he can't fine his discharge papers but has access to his my-chart and will follow up on the details. RN review all pending appointments and inquired on any other needs. Pt declined indicating no other needs at this time. RN encouraged pt to update his primary provider on the recent events.  No other needs at this time. Case will be closed.  Raina Mina, RN Care Management Coordinator [place] Office [phone]

## 2020-06-22 ENCOUNTER — Other Ambulatory Visit: Payer: Self-pay | Admitting: *Deleted

## 2020-06-22 DIAGNOSIS — Z951 Presence of aortocoronary bypass graft: Secondary | ICD-10-CM | POA: Diagnosis not present

## 2020-06-22 DIAGNOSIS — I129 Hypertensive chronic kidney disease with stage 1 through stage 4 chronic kidney disease, or unspecified chronic kidney disease: Secondary | ICD-10-CM | POA: Diagnosis not present

## 2020-06-22 DIAGNOSIS — C20 Malignant neoplasm of rectum: Secondary | ICD-10-CM | POA: Diagnosis not present

## 2020-06-22 DIAGNOSIS — K2270 Barrett's esophagus without dysplasia: Secondary | ICD-10-CM | POA: Diagnosis not present

## 2020-06-22 DIAGNOSIS — N1832 Chronic kidney disease, stage 3b: Secondary | ICD-10-CM | POA: Diagnosis not present

## 2020-06-22 DIAGNOSIS — Z95828 Presence of other vascular implants and grafts: Secondary | ICD-10-CM | POA: Diagnosis not present

## 2020-06-22 DIAGNOSIS — Z432 Encounter for attention to ileostomy: Secondary | ICD-10-CM | POA: Diagnosis not present

## 2020-06-22 DIAGNOSIS — I2510 Atherosclerotic heart disease of native coronary artery without angina pectoris: Secondary | ICD-10-CM | POA: Diagnosis not present

## 2020-06-22 NOTE — Patient Outreach (Signed)
[facility]) Care Management  06/22/2020  Cane Dubray Gallus March 03, 1946 [id number]   EMMI-GENERAL ASSESSMENT RED ON EMMI ALERT Day #4 Date:06/18/2020 Red Alert Reason:NO FOLLOW UP APPOINTMENTS, QUESTIONS/CONCERNS  OUTREACH #1 RN spoke with pt concerning the above emmi. Note previous emmi on the same information. Pt states he just has note been on his Mychart to review the available information. RN offered once again to recite the pending appointments and strongly encouraged pt to make a notation to avoid missing this pending appointments. Offered to assist with any other concerns, pt declined. No other needs at this time  Will close case with no additional needs.  Raina Mina, RN Care Management Coordinator [place] Office [phone]

## 2020-06-24 ENCOUNTER — Other Ambulatory Visit: Payer: Self-pay

## 2020-06-24 NOTE — Progress Notes (Signed)
The proposed treatment discussed in conference is for discussion purposes only and is not a binding recommendation.  The patients have not been physically examined, or presented with their treatment options.  Therefore, final treatment plans cannot be decided.   

## 2020-06-25 DIAGNOSIS — N200 Calculus of kidney: Secondary | ICD-10-CM | POA: Diagnosis not present

## 2020-06-25 DIAGNOSIS — C20 Malignant neoplasm of rectum: Secondary | ICD-10-CM | POA: Diagnosis not present

## 2020-06-25 DIAGNOSIS — I129 Hypertensive chronic kidney disease with stage 1 through stage 4 chronic kidney disease, or unspecified chronic kidney disease: Secondary | ICD-10-CM | POA: Diagnosis not present

## 2020-06-25 DIAGNOSIS — N1831 Chronic kidney disease, stage 3a: Secondary | ICD-10-CM | POA: Diagnosis not present

## 2020-06-25 DIAGNOSIS — N028 Recurrent and persistent hematuria with other morphologic changes: Secondary | ICD-10-CM | POA: Diagnosis not present

## 2020-06-26 DIAGNOSIS — Z95828 Presence of other vascular implants and grafts: Secondary | ICD-10-CM | POA: Diagnosis not present

## 2020-06-26 DIAGNOSIS — I2510 Atherosclerotic heart disease of native coronary artery without angina pectoris: Secondary | ICD-10-CM | POA: Diagnosis not present

## 2020-06-26 DIAGNOSIS — Z432 Encounter for attention to ileostomy: Secondary | ICD-10-CM | POA: Diagnosis not present

## 2020-06-26 DIAGNOSIS — K2270 Barrett's esophagus without dysplasia: Secondary | ICD-10-CM | POA: Diagnosis not present

## 2020-06-26 DIAGNOSIS — Z951 Presence of aortocoronary bypass graft: Secondary | ICD-10-CM | POA: Diagnosis not present

## 2020-06-26 DIAGNOSIS — N1832 Chronic kidney disease, stage 3b: Secondary | ICD-10-CM | POA: Diagnosis not present

## 2020-06-26 DIAGNOSIS — I129 Hypertensive chronic kidney disease with stage 1 through stage 4 chronic kidney disease, or unspecified chronic kidney disease: Secondary | ICD-10-CM | POA: Diagnosis not present

## 2020-06-26 DIAGNOSIS — C20 Malignant neoplasm of rectum: Secondary | ICD-10-CM | POA: Diagnosis not present

## 2020-06-28 DIAGNOSIS — C20 Malignant neoplasm of rectum: Secondary | ICD-10-CM | POA: Diagnosis not present

## 2020-06-29 DIAGNOSIS — C20 Malignant neoplasm of rectum: Secondary | ICD-10-CM | POA: Diagnosis not present

## 2020-06-29 DIAGNOSIS — Z432 Encounter for attention to ileostomy: Secondary | ICD-10-CM | POA: Diagnosis not present

## 2020-06-29 DIAGNOSIS — Z951 Presence of aortocoronary bypass graft: Secondary | ICD-10-CM | POA: Diagnosis not present

## 2020-06-29 DIAGNOSIS — I2510 Atherosclerotic heart disease of native coronary artery without angina pectoris: Secondary | ICD-10-CM | POA: Diagnosis not present

## 2020-06-29 DIAGNOSIS — I129 Hypertensive chronic kidney disease with stage 1 through stage 4 chronic kidney disease, or unspecified chronic kidney disease: Secondary | ICD-10-CM | POA: Diagnosis not present

## 2020-06-29 DIAGNOSIS — K2270 Barrett's esophagus without dysplasia: Secondary | ICD-10-CM | POA: Diagnosis not present

## 2020-06-29 DIAGNOSIS — N1832 Chronic kidney disease, stage 3b: Secondary | ICD-10-CM | POA: Diagnosis not present

## 2020-06-29 DIAGNOSIS — Z95828 Presence of other vascular implants and grafts: Secondary | ICD-10-CM | POA: Diagnosis not present

## 2020-07-01 DIAGNOSIS — C20 Malignant neoplasm of rectum: Secondary | ICD-10-CM | POA: Diagnosis not present

## 2020-07-02 DIAGNOSIS — Z432 Encounter for attention to ileostomy: Secondary | ICD-10-CM | POA: Diagnosis not present

## 2020-07-02 DIAGNOSIS — C20 Malignant neoplasm of rectum: Secondary | ICD-10-CM | POA: Diagnosis not present

## 2020-07-02 DIAGNOSIS — I129 Hypertensive chronic kidney disease with stage 1 through stage 4 chronic kidney disease, or unspecified chronic kidney disease: Secondary | ICD-10-CM | POA: Diagnosis not present

## 2020-07-02 DIAGNOSIS — N1832 Chronic kidney disease, stage 3b: Secondary | ICD-10-CM | POA: Diagnosis not present

## 2020-07-02 DIAGNOSIS — I2510 Atherosclerotic heart disease of native coronary artery without angina pectoris: Secondary | ICD-10-CM | POA: Diagnosis not present

## 2020-07-02 DIAGNOSIS — Z951 Presence of aortocoronary bypass graft: Secondary | ICD-10-CM | POA: Diagnosis not present

## 2020-07-02 DIAGNOSIS — K2270 Barrett's esophagus without dysplasia: Secondary | ICD-10-CM | POA: Diagnosis not present

## 2020-07-02 DIAGNOSIS — Z95828 Presence of other vascular implants and grafts: Secondary | ICD-10-CM | POA: Diagnosis not present

## 2020-07-04 DIAGNOSIS — C20 Malignant neoplasm of rectum: Secondary | ICD-10-CM | POA: Diagnosis not present

## 2020-07-06 DIAGNOSIS — I2510 Atherosclerotic heart disease of native coronary artery without angina pectoris: Secondary | ICD-10-CM | POA: Diagnosis not present

## 2020-07-06 DIAGNOSIS — Z95828 Presence of other vascular implants and grafts: Secondary | ICD-10-CM | POA: Diagnosis not present

## 2020-07-06 DIAGNOSIS — I129 Hypertensive chronic kidney disease with stage 1 through stage 4 chronic kidney disease, or unspecified chronic kidney disease: Secondary | ICD-10-CM | POA: Diagnosis not present

## 2020-07-06 DIAGNOSIS — Z432 Encounter for attention to ileostomy: Secondary | ICD-10-CM | POA: Diagnosis not present

## 2020-07-06 DIAGNOSIS — Z951 Presence of aortocoronary bypass graft: Secondary | ICD-10-CM | POA: Diagnosis not present

## 2020-07-06 DIAGNOSIS — N1832 Chronic kidney disease, stage 3b: Secondary | ICD-10-CM | POA: Diagnosis not present

## 2020-07-06 DIAGNOSIS — K2270 Barrett's esophagus without dysplasia: Secondary | ICD-10-CM | POA: Diagnosis not present

## 2020-07-06 DIAGNOSIS — C20 Malignant neoplasm of rectum: Secondary | ICD-10-CM | POA: Diagnosis not present

## 2020-07-07 DIAGNOSIS — C20 Malignant neoplasm of rectum: Secondary | ICD-10-CM | POA: Diagnosis not present

## 2020-07-10 DIAGNOSIS — I129 Hypertensive chronic kidney disease with stage 1 through stage 4 chronic kidney disease, or unspecified chronic kidney disease: Secondary | ICD-10-CM | POA: Diagnosis not present

## 2020-07-10 DIAGNOSIS — Z951 Presence of aortocoronary bypass graft: Secondary | ICD-10-CM | POA: Diagnosis not present

## 2020-07-10 DIAGNOSIS — Z95828 Presence of other vascular implants and grafts: Secondary | ICD-10-CM | POA: Diagnosis not present

## 2020-07-10 DIAGNOSIS — N1832 Chronic kidney disease, stage 3b: Secondary | ICD-10-CM | POA: Diagnosis not present

## 2020-07-10 DIAGNOSIS — C20 Malignant neoplasm of rectum: Secondary | ICD-10-CM | POA: Diagnosis not present

## 2020-07-10 DIAGNOSIS — K2270 Barrett's esophagus without dysplasia: Secondary | ICD-10-CM | POA: Diagnosis not present

## 2020-07-10 DIAGNOSIS — I2510 Atherosclerotic heart disease of native coronary artery without angina pectoris: Secondary | ICD-10-CM | POA: Diagnosis not present

## 2020-07-10 DIAGNOSIS — Z432 Encounter for attention to ileostomy: Secondary | ICD-10-CM | POA: Diagnosis not present

## 2020-07-13 DIAGNOSIS — I2510 Atherosclerotic heart disease of native coronary artery without angina pectoris: Secondary | ICD-10-CM | POA: Diagnosis not present

## 2020-07-13 DIAGNOSIS — N1832 Chronic kidney disease, stage 3b: Secondary | ICD-10-CM | POA: Diagnosis not present

## 2020-07-13 DIAGNOSIS — Z95828 Presence of other vascular implants and grafts: Secondary | ICD-10-CM | POA: Diagnosis not present

## 2020-07-13 DIAGNOSIS — C20 Malignant neoplasm of rectum: Secondary | ICD-10-CM | POA: Diagnosis not present

## 2020-07-13 DIAGNOSIS — K2270 Barrett's esophagus without dysplasia: Secondary | ICD-10-CM | POA: Diagnosis not present

## 2020-07-13 DIAGNOSIS — Z951 Presence of aortocoronary bypass graft: Secondary | ICD-10-CM | POA: Diagnosis not present

## 2020-07-13 DIAGNOSIS — I129 Hypertensive chronic kidney disease with stage 1 through stage 4 chronic kidney disease, or unspecified chronic kidney disease: Secondary | ICD-10-CM | POA: Diagnosis not present

## 2020-07-13 DIAGNOSIS — Z432 Encounter for attention to ileostomy: Secondary | ICD-10-CM | POA: Diagnosis not present

## 2020-07-16 DIAGNOSIS — C20 Malignant neoplasm of rectum: Secondary | ICD-10-CM | POA: Diagnosis not present

## 2020-07-17 ENCOUNTER — Other Ambulatory Visit: Payer: Self-pay | Admitting: Cardiology

## 2020-07-17 DIAGNOSIS — I2510 Atherosclerotic heart disease of native coronary artery without angina pectoris: Secondary | ICD-10-CM

## 2020-07-17 DIAGNOSIS — K2270 Barrett's esophagus without dysplasia: Secondary | ICD-10-CM | POA: Diagnosis not present

## 2020-07-17 DIAGNOSIS — Z432 Encounter for attention to ileostomy: Secondary | ICD-10-CM | POA: Diagnosis not present

## 2020-07-17 DIAGNOSIS — I129 Hypertensive chronic kidney disease with stage 1 through stage 4 chronic kidney disease, or unspecified chronic kidney disease: Secondary | ICD-10-CM | POA: Diagnosis not present

## 2020-07-17 DIAGNOSIS — Z95828 Presence of other vascular implants and grafts: Secondary | ICD-10-CM | POA: Diagnosis not present

## 2020-07-17 DIAGNOSIS — Z951 Presence of aortocoronary bypass graft: Secondary | ICD-10-CM | POA: Diagnosis not present

## 2020-07-17 DIAGNOSIS — N1832 Chronic kidney disease, stage 3b: Secondary | ICD-10-CM | POA: Diagnosis not present

## 2020-07-17 DIAGNOSIS — C20 Malignant neoplasm of rectum: Secondary | ICD-10-CM | POA: Diagnosis not present

## 2020-07-19 DIAGNOSIS — C20 Malignant neoplasm of rectum: Secondary | ICD-10-CM | POA: Diagnosis not present

## 2020-07-20 DIAGNOSIS — N1832 Chronic kidney disease, stage 3b: Secondary | ICD-10-CM | POA: Diagnosis not present

## 2020-07-20 DIAGNOSIS — K2270 Barrett's esophagus without dysplasia: Secondary | ICD-10-CM | POA: Diagnosis not present

## 2020-07-20 DIAGNOSIS — Z95828 Presence of other vascular implants and grafts: Secondary | ICD-10-CM | POA: Diagnosis not present

## 2020-07-20 DIAGNOSIS — I2510 Atherosclerotic heart disease of native coronary artery without angina pectoris: Secondary | ICD-10-CM | POA: Diagnosis not present

## 2020-07-20 DIAGNOSIS — Z432 Encounter for attention to ileostomy: Secondary | ICD-10-CM | POA: Diagnosis not present

## 2020-07-20 DIAGNOSIS — C20 Malignant neoplasm of rectum: Secondary | ICD-10-CM | POA: Diagnosis not present

## 2020-07-20 DIAGNOSIS — I129 Hypertensive chronic kidney disease with stage 1 through stage 4 chronic kidney disease, or unspecified chronic kidney disease: Secondary | ICD-10-CM | POA: Diagnosis not present

## 2020-07-20 DIAGNOSIS — Z951 Presence of aortocoronary bypass graft: Secondary | ICD-10-CM | POA: Diagnosis not present

## 2020-07-22 DIAGNOSIS — C20 Malignant neoplasm of rectum: Secondary | ICD-10-CM | POA: Diagnosis not present

## 2020-07-23 DIAGNOSIS — I2510 Atherosclerotic heart disease of native coronary artery without angina pectoris: Secondary | ICD-10-CM | POA: Diagnosis not present

## 2020-07-23 DIAGNOSIS — Z432 Encounter for attention to ileostomy: Secondary | ICD-10-CM | POA: Diagnosis not present

## 2020-07-23 DIAGNOSIS — Z951 Presence of aortocoronary bypass graft: Secondary | ICD-10-CM | POA: Diagnosis not present

## 2020-07-23 DIAGNOSIS — Z95828 Presence of other vascular implants and grafts: Secondary | ICD-10-CM | POA: Diagnosis not present

## 2020-07-23 DIAGNOSIS — I129 Hypertensive chronic kidney disease with stage 1 through stage 4 chronic kidney disease, or unspecified chronic kidney disease: Secondary | ICD-10-CM | POA: Diagnosis not present

## 2020-07-23 DIAGNOSIS — N1832 Chronic kidney disease, stage 3b: Secondary | ICD-10-CM | POA: Diagnosis not present

## 2020-07-23 DIAGNOSIS — K2270 Barrett's esophagus without dysplasia: Secondary | ICD-10-CM | POA: Diagnosis not present

## 2020-07-23 DIAGNOSIS — C20 Malignant neoplasm of rectum: Secondary | ICD-10-CM | POA: Diagnosis not present

## 2020-07-24 DIAGNOSIS — N1832 Chronic kidney disease, stage 3b: Secondary | ICD-10-CM | POA: Diagnosis not present

## 2020-07-24 DIAGNOSIS — Z432 Encounter for attention to ileostomy: Secondary | ICD-10-CM | POA: Diagnosis not present

## 2020-07-24 DIAGNOSIS — K2270 Barrett's esophagus without dysplasia: Secondary | ICD-10-CM | POA: Diagnosis not present

## 2020-07-24 DIAGNOSIS — Z95828 Presence of other vascular implants and grafts: Secondary | ICD-10-CM | POA: Diagnosis not present

## 2020-07-24 DIAGNOSIS — I129 Hypertensive chronic kidney disease with stage 1 through stage 4 chronic kidney disease, or unspecified chronic kidney disease: Secondary | ICD-10-CM | POA: Diagnosis not present

## 2020-07-24 DIAGNOSIS — Z951 Presence of aortocoronary bypass graft: Secondary | ICD-10-CM | POA: Diagnosis not present

## 2020-07-24 DIAGNOSIS — C20 Malignant neoplasm of rectum: Secondary | ICD-10-CM | POA: Diagnosis not present

## 2020-07-24 DIAGNOSIS — I2510 Atherosclerotic heart disease of native coronary artery without angina pectoris: Secondary | ICD-10-CM | POA: Diagnosis not present

## 2020-07-25 DIAGNOSIS — C20 Malignant neoplasm of rectum: Secondary | ICD-10-CM | POA: Diagnosis not present

## 2020-07-27 DIAGNOSIS — N1832 Chronic kidney disease, stage 3b: Secondary | ICD-10-CM | POA: Diagnosis not present

## 2020-07-27 DIAGNOSIS — K2270 Barrett's esophagus without dysplasia: Secondary | ICD-10-CM | POA: Diagnosis not present

## 2020-07-27 DIAGNOSIS — Z95828 Presence of other vascular implants and grafts: Secondary | ICD-10-CM | POA: Diagnosis not present

## 2020-07-27 DIAGNOSIS — I129 Hypertensive chronic kidney disease with stage 1 through stage 4 chronic kidney disease, or unspecified chronic kidney disease: Secondary | ICD-10-CM | POA: Diagnosis not present

## 2020-07-27 DIAGNOSIS — C20 Malignant neoplasm of rectum: Secondary | ICD-10-CM | POA: Diagnosis not present

## 2020-07-27 DIAGNOSIS — Z432 Encounter for attention to ileostomy: Secondary | ICD-10-CM | POA: Diagnosis not present

## 2020-07-27 DIAGNOSIS — Z951 Presence of aortocoronary bypass graft: Secondary | ICD-10-CM | POA: Diagnosis not present

## 2020-07-27 DIAGNOSIS — I2510 Atherosclerotic heart disease of native coronary artery without angina pectoris: Secondary | ICD-10-CM | POA: Diagnosis not present

## 2020-07-28 DIAGNOSIS — C20 Malignant neoplasm of rectum: Secondary | ICD-10-CM | POA: Diagnosis not present

## 2020-07-30 ENCOUNTER — Other Ambulatory Visit: Payer: Self-pay | Admitting: Gastroenterology

## 2020-07-30 DIAGNOSIS — Z432 Encounter for attention to ileostomy: Secondary | ICD-10-CM | POA: Diagnosis not present

## 2020-07-30 DIAGNOSIS — K2970 Gastritis, unspecified, without bleeding: Secondary | ICD-10-CM

## 2020-07-30 DIAGNOSIS — N1832 Chronic kidney disease, stage 3b: Secondary | ICD-10-CM | POA: Diagnosis not present

## 2020-07-30 DIAGNOSIS — K2270 Barrett's esophagus without dysplasia: Secondary | ICD-10-CM | POA: Diagnosis not present

## 2020-07-30 DIAGNOSIS — I2510 Atherosclerotic heart disease of native coronary artery without angina pectoris: Secondary | ICD-10-CM | POA: Diagnosis not present

## 2020-07-30 DIAGNOSIS — I129 Hypertensive chronic kidney disease with stage 1 through stage 4 chronic kidney disease, or unspecified chronic kidney disease: Secondary | ICD-10-CM | POA: Diagnosis not present

## 2020-07-30 DIAGNOSIS — K209 Esophagitis, unspecified without bleeding: Secondary | ICD-10-CM

## 2020-07-30 DIAGNOSIS — Z951 Presence of aortocoronary bypass graft: Secondary | ICD-10-CM | POA: Diagnosis not present

## 2020-07-30 DIAGNOSIS — Z95828 Presence of other vascular implants and grafts: Secondary | ICD-10-CM | POA: Diagnosis not present

## 2020-07-30 DIAGNOSIS — C20 Malignant neoplasm of rectum: Secondary | ICD-10-CM | POA: Diagnosis not present

## 2020-07-31 ENCOUNTER — Inpatient Hospital Stay: Payer: Medicare Other

## 2020-07-31 ENCOUNTER — Other Ambulatory Visit: Payer: Medicare Other

## 2020-07-31 ENCOUNTER — Ambulatory Visit: Payer: Medicare Other | Admitting: Oncology

## 2020-07-31 ENCOUNTER — Inpatient Hospital Stay (HOSPITAL_BASED_OUTPATIENT_CLINIC_OR_DEPARTMENT_OTHER): Payer: Medicare Other | Admitting: Oncology

## 2020-07-31 ENCOUNTER — Other Ambulatory Visit: Payer: Self-pay

## 2020-07-31 ENCOUNTER — Inpatient Hospital Stay: Payer: Medicare Other | Attending: Oncology

## 2020-07-31 VITALS — BP 136/69 | HR 66 | Temp 96.3°F | Resp 18 | Ht 68.0 in | Wt 144.3 lb

## 2020-07-31 DIAGNOSIS — K2270 Barrett's esophagus without dysplasia: Secondary | ICD-10-CM | POA: Diagnosis not present

## 2020-07-31 DIAGNOSIS — I2510 Atherosclerotic heart disease of native coronary artery without angina pectoris: Secondary | ICD-10-CM | POA: Diagnosis not present

## 2020-07-31 DIAGNOSIS — D696 Thrombocytopenia, unspecified: Secondary | ICD-10-CM | POA: Insufficient documentation

## 2020-07-31 DIAGNOSIS — C20 Malignant neoplasm of rectum: Secondary | ICD-10-CM | POA: Insufficient documentation

## 2020-07-31 DIAGNOSIS — C778 Secondary and unspecified malignant neoplasm of lymph nodes of multiple regions: Secondary | ICD-10-CM | POA: Insufficient documentation

## 2020-07-31 DIAGNOSIS — I129 Hypertensive chronic kidney disease with stage 1 through stage 4 chronic kidney disease, or unspecified chronic kidney disease: Secondary | ICD-10-CM | POA: Diagnosis not present

## 2020-07-31 DIAGNOSIS — N1832 Chronic kidney disease, stage 3b: Secondary | ICD-10-CM | POA: Diagnosis not present

## 2020-07-31 DIAGNOSIS — Z95828 Presence of other vascular implants and grafts: Secondary | ICD-10-CM | POA: Diagnosis not present

## 2020-07-31 DIAGNOSIS — Z951 Presence of aortocoronary bypass graft: Secondary | ICD-10-CM | POA: Diagnosis not present

## 2020-07-31 DIAGNOSIS — Z923 Personal history of irradiation: Secondary | ICD-10-CM | POA: Diagnosis not present

## 2020-07-31 DIAGNOSIS — Z79899 Other long term (current) drug therapy: Secondary | ICD-10-CM | POA: Diagnosis not present

## 2020-07-31 DIAGNOSIS — Z432 Encounter for attention to ileostomy: Secondary | ICD-10-CM | POA: Diagnosis not present

## 2020-07-31 LAB — CBC WITH DIFFERENTIAL (CANCER CENTER ONLY)
Abs Immature Granulocytes: 0.01 10*3/uL (ref 0.00–0.07)
Basophils Absolute: 0 10*3/uL (ref 0.0–0.1)
Basophils Relative: 1 %
Eosinophils Absolute: 0 10*3/uL (ref 0.0–0.5)
Eosinophils Relative: 1 %
HCT: 30.5 % — ABNORMAL LOW (ref 39.0–52.0)
Hemoglobin: 10.1 g/dL — ABNORMAL LOW (ref 13.0–17.0)
Immature Granulocytes: 0 %
Lymphocytes Relative: 16 %
Lymphs Abs: 0.7 10*3/uL (ref 0.7–4.0)
MCH: 31.4 pg (ref 26.0–34.0)
MCHC: 33.1 g/dL (ref 30.0–36.0)
MCV: 94.7 fL (ref 80.0–100.0)
Monocytes Absolute: 0.4 10*3/uL (ref 0.1–1.0)
Monocytes Relative: 10 %
Neutro Abs: 3.3 10*3/uL (ref 1.7–7.7)
Neutrophils Relative %: 72 %
Platelet Count: 115 10*3/uL — ABNORMAL LOW (ref 150–400)
RBC: 3.22 MIL/uL — ABNORMAL LOW (ref 4.22–5.81)
RDW: 13.6 % (ref 11.5–15.5)
WBC Count: 4.5 10*3/uL (ref 4.0–10.5)
nRBC: 0 % (ref 0.0–0.2)

## 2020-07-31 LAB — CMP (CANCER CENTER ONLY)
ALT: 11 U/L (ref 0–44)
AST: 17 U/L (ref 15–41)
Albumin: 3.3 g/dL — ABNORMAL LOW (ref 3.5–5.0)
Alkaline Phosphatase: 81 U/L (ref 38–126)
Anion gap: 7 (ref 5–15)
BUN: 19 mg/dL (ref 8–23)
CO2: 21 mmol/L — ABNORMAL LOW (ref 22–32)
Calcium: 8.2 mg/dL — ABNORMAL LOW (ref 8.9–10.3)
Chloride: 111 mmol/L (ref 98–111)
Creatinine: 1.33 mg/dL — ABNORMAL HIGH (ref 0.61–1.24)
GFR, Estimated: 56 mL/min — ABNORMAL LOW (ref >60)
Glucose, Bld: 97 mg/dL (ref 70–99)
Potassium: 4 mmol/L (ref 3.5–5.1)
Sodium: 139 mmol/L (ref 135–145)
Total Bilirubin: 0.4 mg/dL (ref 0.3–1.2)
Total Protein: 6.5 g/dL (ref 6.5–8.1)

## 2020-07-31 MED ORDER — HEPARIN SOD (PORK) LOCK FLUSH 100 UNIT/ML IV SOLN
250.0000 [IU] | Freq: Once | INTRAVENOUS | Status: AC
  Administered 2020-07-31: 250 [IU]
  Filled 2020-07-31: qty 5

## 2020-07-31 MED ORDER — SODIUM CHLORIDE 0.9% FLUSH
10.0000 mL | Freq: Once | INTRAVENOUS | Status: AC
  Administered 2020-07-31: 10 mL
  Filled 2020-07-31: qty 10

## 2020-07-31 NOTE — Patient Instructions (Signed)

## 2020-07-31 NOTE — Progress Notes (Signed)
Hematology and Oncology Follow Up Visit  Edsel Shives Creger [id number] Aug 06, 1945 75 y.o. 07/31/2020 2:03 PM [name], MDGreene, [name], MD   Principle Diagnosis: 75 year old man with T3N2 rectal cancer diagnosed in March 2021.  He presented with pelvic adenopathy at that time.   Prior Therapy:   He is status post colonoscopy and endoscopy completed on August 13, 2019.  Rectal biopsy showed an invasive adenocarcinoma.  Neoadjuvant chemotherapy utilizing FOLFOX started on September 05 2019.   He completed 7 cycles of therapy in August 2021.  Radiation therapy with oral Xeloda started on September 27.  Therapy completed on November 3 of 2021.  He received 45 Gy in 25 fractions to the rectum and regional lymph nodes.  He is status post a robotic assisted lower anterior resection with diverting loop ileostomy completed on June 10, 2020.  The final pathology showed metastatic adenocarcinoma to 2 out 7 lymph nodes with extranodal extension.  No evidence of residual dysplasia or carcinoma of the colonic wall.  Margins were negative.  Current therapy: Postoperative care and active surveillance.   Interim History: Mr. Vizcarrondo is here for return visit.  Since the last visit, he underwent low anterior resection in the care of Dr. Marcello Moores outlined above without any complications.  Since his surgery he has been recovering slowly at this time and the has not gained most of his weight currently.  He is eating better and has regained most activities of daily living.  He denies any abdominal pain or diarrhea.  Denies any recent hospitalization or illnesses.    Medications: Reviewed without any changes. Current Outpatient Medications  Medication Sig Dispense Refill  . ascorbic acid (VITAMIN C) 500 MG tablet Take 500 mg by mouth daily.    Marland Kitchen aspirin EC 81 MG tablet Take 1 tablet (81 mg total) by mouth daily. 90 tablet 3  . atenolol (TENORMIN) 50 MG tablet TAKE 1 TABLET BY MOUTH EVERY DAY 90 tablet 3  .  Calcium 600-200 MG-UNIT tablet Take 1 tablet by mouth daily.    Marland Kitchen loperamide (IMODIUM) 2 MG capsule Take 1 capsule (2 mg total) by mouth 4 (four) times daily -  before meals and at bedtime. 90 capsule 2  . Magnesium 250 MG TABS Take 250 mg by mouth daily.    . naproxen sodium (ALEVE) 220 MG tablet Take 220-440 mg by mouth 2 (two) times daily as needed (aches/pain.).    Marland Kitchen nitroGLYCERIN (NITROSTAT) 0.4 MG SL tablet Place 0.4 mg under the tongue every 5 (five) minutes x 3 doses as needed for chest pain.    Marland Kitchen omeprazole (PRILOSEC) 40 MG capsule TAKE 1 CAPSULE BY MOUTH TWICE A DAY 180 capsule 1  . polycarbophil (FIBERCON) 625 MG tablet Take 1 tablet (625 mg total) by mouth 3 (three) times daily before meals.    . rosuvastatin (CRESTOR) 20 MG tablet TAKE 1 TABLET BY MOUTH EVERY DAY 90 tablet 2  . sodium chloride 0.9 % Inject 1,000 mLs into the vein every 3 (three) days. 1000 mL 0  . vitamin B-12 (CYANOCOBALAMIN) 50 MCG tablet Take 50 mcg by mouth daily.    . vitamin E 45 MG (100 UNITS) capsule Take 100 Units by mouth daily.     No current facility-administered medications for this visit.     Allergies: No Known Allergies    Physical Exam:          ECOG: 1    General appearance: Comfortable appearing without any discomfort Head: Normocephalic  without any trauma Oropharynx: Mucous membranes are moist and pink without any thrush or ulcers. Eyes: Pupils are equal and round reactive to light. Lymph nodes: No cervical, supraclavicular, inguinal or axillary lymphadenopathy.   Heart:regular rate and rhythm.  S1 and S2 without leg edema. Lung: Clear without any rhonchi or wheezes.  No dullness to percussion. Abdomin: Soft, nontender, nondistended with good bowel sounds.  No hepatosplenomegaly. Musculoskeletal: No joint deformity or effusion.  Full range of motion noted. Neurological: No deficits noted on motor, sensory and deep tendon reflex exam. Skin: No petechial rash or dryness.   Appeared moist.               Lab Results: Lab Results  Component Value Date   WBC 4.0 06/14/2020   HGB 9.8 (L) 06/14/2020   HCT 30.4 (L) 06/14/2020   MCV 103.4 (H) 06/14/2020   PLT 97 (L) 06/14/2020     Chemistry      Component Value Date/Time   NA 141 06/15/2020 0508   NA 140 12/09/2016 0932   K 3.7 06/15/2020 0508   CL 109 06/15/2020 0508   CO2 23 06/15/2020 0508   BUN 16 06/15/2020 0508   BUN 29 (H) 12/09/2016 0932   CREATININE 1.29 (H) 06/15/2020 0508   CREATININE 1.22 04/28/2020 0837   CREATININE 1.61 (H) 01/21/2016 1030      Component Value Date/Time   CALCIUM 8.4 (L) 06/15/2020 0508   CALCIUM 7.0 (L) 10/09/2015 0458   ALKPHOS 69 04/28/2020 0837   AST 21 04/28/2020 0837   ALT 15 04/28/2020 0837   BILITOT 0.5 04/28/2020 0837         Impression and Plan:  75 year old man with:  1.  T3N2 stage III rectal cancer diagnosed in March 2021.    He is status post neoadjuvant total therapy utilizing FOLFOX chemotherapy followed by radiation with Xeloda and a lower anterior resection completed in January 2022.  He final pathology was personally reviewed today and discussed with the patient which showed a residual 2 out of 7 lymph nodes.  The natural course of this disease and risk of relapse was assessed at this time.  Despite this aggressive treatment he still has residual tumor and chance of relapsed disease.  At this time, I recommend active surveillance without any additional therapy and reserve any additional chemotherapy unless he has disease relapse.  I recommend repeat imaging studies in 3 months and evaluation at that time.  2.  IV access: Port-A-Cath will continue to be in place and flushed periodically.   3.  Thrombocytopenia: Continues to improve off chemotherapy and close to normal range.  4.  Follow-up: He will return in 3 months after repeat imaging studies.    30  minutes were dedicated to this encounter.  The time was spent on  reviewing pathology results, treatment options and future plan of care review.    [name], MD 3/11/20222:03 PM

## 2020-08-01 DIAGNOSIS — C20 Malignant neoplasm of rectum: Secondary | ICD-10-CM | POA: Diagnosis not present

## 2020-08-03 ENCOUNTER — Telehealth: Payer: Self-pay | Admitting: Oncology

## 2020-08-03 DIAGNOSIS — N1832 Chronic kidney disease, stage 3b: Secondary | ICD-10-CM | POA: Diagnosis not present

## 2020-08-03 DIAGNOSIS — K2270 Barrett's esophagus without dysplasia: Secondary | ICD-10-CM | POA: Diagnosis not present

## 2020-08-03 DIAGNOSIS — I129 Hypertensive chronic kidney disease with stage 1 through stage 4 chronic kidney disease, or unspecified chronic kidney disease: Secondary | ICD-10-CM | POA: Diagnosis not present

## 2020-08-03 DIAGNOSIS — Z95828 Presence of other vascular implants and grafts: Secondary | ICD-10-CM | POA: Diagnosis not present

## 2020-08-03 DIAGNOSIS — I2510 Atherosclerotic heart disease of native coronary artery without angina pectoris: Secondary | ICD-10-CM | POA: Diagnosis not present

## 2020-08-03 DIAGNOSIS — Z951 Presence of aortocoronary bypass graft: Secondary | ICD-10-CM | POA: Diagnosis not present

## 2020-08-03 DIAGNOSIS — C20 Malignant neoplasm of rectum: Secondary | ICD-10-CM | POA: Diagnosis not present

## 2020-08-03 DIAGNOSIS — Z432 Encounter for attention to ileostomy: Secondary | ICD-10-CM | POA: Diagnosis not present

## 2020-08-03 NOTE — Telephone Encounter (Signed)
Scheduled follow-up appointments per 3/11 los. Patient is aware. 

## 2020-08-05 DIAGNOSIS — C20 Malignant neoplasm of rectum: Secondary | ICD-10-CM | POA: Diagnosis not present

## 2020-08-06 DIAGNOSIS — Z95828 Presence of other vascular implants and grafts: Secondary | ICD-10-CM | POA: Diagnosis not present

## 2020-08-06 DIAGNOSIS — Z951 Presence of aortocoronary bypass graft: Secondary | ICD-10-CM | POA: Diagnosis not present

## 2020-08-06 DIAGNOSIS — Z432 Encounter for attention to ileostomy: Secondary | ICD-10-CM | POA: Diagnosis not present

## 2020-08-06 DIAGNOSIS — I129 Hypertensive chronic kidney disease with stage 1 through stage 4 chronic kidney disease, or unspecified chronic kidney disease: Secondary | ICD-10-CM | POA: Diagnosis not present

## 2020-08-06 DIAGNOSIS — K2270 Barrett's esophagus without dysplasia: Secondary | ICD-10-CM | POA: Diagnosis not present

## 2020-08-06 DIAGNOSIS — N1832 Chronic kidney disease, stage 3b: Secondary | ICD-10-CM | POA: Diagnosis not present

## 2020-08-06 DIAGNOSIS — C20 Malignant neoplasm of rectum: Secondary | ICD-10-CM | POA: Diagnosis not present

## 2020-08-06 DIAGNOSIS — I2510 Atherosclerotic heart disease of native coronary artery without angina pectoris: Secondary | ICD-10-CM | POA: Diagnosis not present

## 2020-08-07 DIAGNOSIS — C20 Malignant neoplasm of rectum: Secondary | ICD-10-CM | POA: Diagnosis not present

## 2020-08-07 DIAGNOSIS — Z432 Encounter for attention to ileostomy: Secondary | ICD-10-CM | POA: Diagnosis not present

## 2020-08-07 DIAGNOSIS — N1832 Chronic kidney disease, stage 3b: Secondary | ICD-10-CM | POA: Diagnosis not present

## 2020-08-07 DIAGNOSIS — K2270 Barrett's esophagus without dysplasia: Secondary | ICD-10-CM | POA: Diagnosis not present

## 2020-08-07 DIAGNOSIS — Z951 Presence of aortocoronary bypass graft: Secondary | ICD-10-CM | POA: Diagnosis not present

## 2020-08-07 DIAGNOSIS — I129 Hypertensive chronic kidney disease with stage 1 through stage 4 chronic kidney disease, or unspecified chronic kidney disease: Secondary | ICD-10-CM | POA: Diagnosis not present

## 2020-08-07 DIAGNOSIS — Z95828 Presence of other vascular implants and grafts: Secondary | ICD-10-CM | POA: Diagnosis not present

## 2020-08-07 DIAGNOSIS — I2510 Atherosclerotic heart disease of native coronary artery without angina pectoris: Secondary | ICD-10-CM | POA: Diagnosis not present

## 2020-08-09 DIAGNOSIS — C20 Malignant neoplasm of rectum: Secondary | ICD-10-CM | POA: Diagnosis not present

## 2020-08-12 DIAGNOSIS — C20 Malignant neoplasm of rectum: Secondary | ICD-10-CM | POA: Diagnosis not present

## 2020-08-15 DIAGNOSIS — Z95828 Presence of other vascular implants and grafts: Secondary | ICD-10-CM | POA: Diagnosis not present

## 2020-08-15 DIAGNOSIS — N1832 Chronic kidney disease, stage 3b: Secondary | ICD-10-CM | POA: Diagnosis not present

## 2020-08-15 DIAGNOSIS — Z951 Presence of aortocoronary bypass graft: Secondary | ICD-10-CM | POA: Diagnosis not present

## 2020-08-15 DIAGNOSIS — C20 Malignant neoplasm of rectum: Secondary | ICD-10-CM | POA: Diagnosis not present

## 2020-08-15 DIAGNOSIS — Z432 Encounter for attention to ileostomy: Secondary | ICD-10-CM | POA: Diagnosis not present

## 2020-08-15 DIAGNOSIS — I129 Hypertensive chronic kidney disease with stage 1 through stage 4 chronic kidney disease, or unspecified chronic kidney disease: Secondary | ICD-10-CM | POA: Diagnosis not present

## 2020-08-15 DIAGNOSIS — K2270 Barrett's esophagus without dysplasia: Secondary | ICD-10-CM | POA: Diagnosis not present

## 2020-08-15 DIAGNOSIS — I2510 Atherosclerotic heart disease of native coronary artery without angina pectoris: Secondary | ICD-10-CM | POA: Diagnosis not present

## 2020-08-18 DIAGNOSIS — C20 Malignant neoplasm of rectum: Secondary | ICD-10-CM | POA: Diagnosis not present

## 2020-08-21 DIAGNOSIS — C20 Malignant neoplasm of rectum: Secondary | ICD-10-CM | POA: Diagnosis not present

## 2020-08-22 DIAGNOSIS — Z452 Encounter for adjustment and management of vascular access device: Secondary | ICD-10-CM | POA: Diagnosis not present

## 2020-08-22 DIAGNOSIS — I129 Hypertensive chronic kidney disease with stage 1 through stage 4 chronic kidney disease, or unspecified chronic kidney disease: Secondary | ICD-10-CM | POA: Diagnosis not present

## 2020-08-22 DIAGNOSIS — N1832 Chronic kidney disease, stage 3b: Secondary | ICD-10-CM | POA: Diagnosis not present

## 2020-08-22 DIAGNOSIS — Z432 Encounter for attention to ileostomy: Secondary | ICD-10-CM | POA: Diagnosis not present

## 2020-08-22 DIAGNOSIS — C20 Malignant neoplasm of rectum: Secondary | ICD-10-CM | POA: Diagnosis not present

## 2020-08-22 DIAGNOSIS — Z48815 Encounter for surgical aftercare following surgery on the digestive system: Secondary | ICD-10-CM | POA: Diagnosis not present

## 2020-08-23 DIAGNOSIS — C20 Malignant neoplasm of rectum: Secondary | ICD-10-CM | POA: Diagnosis not present

## 2020-08-24 ENCOUNTER — Observation Stay (HOSPITAL_COMMUNITY): Payer: Medicare Other

## 2020-08-24 ENCOUNTER — Other Ambulatory Visit: Payer: Self-pay

## 2020-08-24 ENCOUNTER — Inpatient Hospital Stay (HOSPITAL_COMMUNITY)
Admission: EM | Admit: 2020-08-24 | Discharge: 2020-08-26 | DRG: 683 | Disposition: A | Discharge status: Home Health | Payer: Medicare Other | Attending: Internal Medicine | Admitting: Internal Medicine

## 2020-08-24 ENCOUNTER — Emergency Department (HOSPITAL_COMMUNITY): Payer: Medicare Other

## 2020-08-24 ENCOUNTER — Encounter (HOSPITAL_COMMUNITY): Payer: Self-pay

## 2020-08-24 DIAGNOSIS — D696 Thrombocytopenia, unspecified: Secondary | ICD-10-CM | POA: Diagnosis not present

## 2020-08-24 DIAGNOSIS — Z932 Ileostomy status: Secondary | ICD-10-CM

## 2020-08-24 DIAGNOSIS — N1832 Chronic kidney disease, stage 3b: Secondary | ICD-10-CM | POA: Diagnosis present

## 2020-08-24 DIAGNOSIS — R7303 Prediabetes: Secondary | ICD-10-CM | POA: Diagnosis not present

## 2020-08-24 DIAGNOSIS — R531 Weakness: Secondary | ICD-10-CM | POA: Diagnosis not present

## 2020-08-24 DIAGNOSIS — Z951 Presence of aortocoronary bypass graft: Secondary | ICD-10-CM | POA: Diagnosis not present

## 2020-08-24 DIAGNOSIS — R198 Other specified symptoms and signs involving the digestive system and abdomen: Secondary | ICD-10-CM | POA: Diagnosis not present

## 2020-08-24 DIAGNOSIS — C20 Malignant neoplasm of rectum: Secondary | ICD-10-CM | POA: Diagnosis present

## 2020-08-24 DIAGNOSIS — Z7982 Long term (current) use of aspirin: Secondary | ICD-10-CM

## 2020-08-24 DIAGNOSIS — E860 Dehydration: Secondary | ICD-10-CM | POA: Diagnosis present

## 2020-08-24 DIAGNOSIS — E872 Acidosis: Secondary | ICD-10-CM | POA: Diagnosis present

## 2020-08-24 DIAGNOSIS — Z20822 Contact with and (suspected) exposure to covid-19: Secondary | ICD-10-CM | POA: Diagnosis not present

## 2020-08-24 DIAGNOSIS — N179 Acute kidney failure, unspecified: Principal | ICD-10-CM | POA: Diagnosis present

## 2020-08-24 DIAGNOSIS — N200 Calculus of kidney: Secondary | ICD-10-CM | POA: Diagnosis not present

## 2020-08-24 DIAGNOSIS — I2510 Atherosclerotic heart disease of native coronary artery without angina pectoris: Secondary | ICD-10-CM | POA: Diagnosis not present

## 2020-08-24 DIAGNOSIS — K219 Gastro-esophageal reflux disease without esophagitis: Secondary | ICD-10-CM | POA: Diagnosis not present

## 2020-08-24 DIAGNOSIS — D630 Anemia in neoplastic disease: Secondary | ICD-10-CM | POA: Diagnosis present

## 2020-08-24 DIAGNOSIS — I129 Hypertensive chronic kidney disease with stage 1 through stage 4 chronic kidney disease, or unspecified chronic kidney disease: Secondary | ICD-10-CM | POA: Diagnosis present

## 2020-08-24 DIAGNOSIS — E785 Hyperlipidemia, unspecified: Secondary | ICD-10-CM | POA: Diagnosis present

## 2020-08-24 DIAGNOSIS — C7800 Secondary malignant neoplasm of unspecified lung: Secondary | ICD-10-CM | POA: Diagnosis present

## 2020-08-24 DIAGNOSIS — I10 Essential (primary) hypertension: Secondary | ICD-10-CM | POA: Diagnosis present

## 2020-08-24 DIAGNOSIS — Z79899 Other long term (current) drug therapy: Secondary | ICD-10-CM

## 2020-08-24 DIAGNOSIS — N281 Cyst of kidney, acquired: Secondary | ICD-10-CM | POA: Diagnosis not present

## 2020-08-24 LAB — CBC WITH DIFFERENTIAL/PLATELET
Abs Immature Granulocytes: 0.05 10*3/uL (ref 0.00–0.07)
Basophils Absolute: 0.1 10*3/uL (ref 0.0–0.1)
Basophils Relative: 1 %
Eosinophils Absolute: 0.1 10*3/uL (ref 0.0–0.5)
Eosinophils Relative: 1 %
HCT: 38.7 % — ABNORMAL LOW (ref 39.0–52.0)
Hemoglobin: 12.9 g/dL — ABNORMAL LOW (ref 13.0–17.0)
Immature Granulocytes: 1 %
Lymphocytes Relative: 14 %
Lymphs Abs: 0.9 10*3/uL (ref 0.7–4.0)
MCH: 31.2 pg (ref 26.0–34.0)
MCHC: 33.3 g/dL (ref 30.0–36.0)
MCV: 93.7 fL (ref 80.0–100.0)
Monocytes Absolute: 0.5 10*3/uL (ref 0.1–1.0)
Monocytes Relative: 8 %
Neutro Abs: 5.1 10*3/uL (ref 1.7–7.7)
Neutrophils Relative %: 75 %
Platelets: 143 10*3/uL — ABNORMAL LOW (ref 150–400)
RBC: 4.13 MIL/uL — ABNORMAL LOW (ref 4.22–5.81)
RDW: 14 % (ref 11.5–15.5)
WBC: 6.7 10*3/uL (ref 4.0–10.5)
nRBC: 0 % (ref 0.0–0.2)

## 2020-08-24 LAB — COMPREHENSIVE METABOLIC PANEL
ALT: 17 U/L (ref 0–44)
AST: 18 U/L (ref 15–41)
Albumin: 4.2 g/dL (ref 3.5–5.0)
Alkaline Phosphatase: 81 U/L (ref 38–126)
Anion gap: 12 (ref 5–15)
BUN: 93 mg/dL — ABNORMAL HIGH (ref 8–23)
CO2: 17 mmol/L — ABNORMAL LOW (ref 22–32)
Calcium: 7.4 mg/dL — ABNORMAL LOW (ref 8.9–10.3)
Chloride: 105 mmol/L (ref 98–111)
Creatinine, Ser: 3.51 mg/dL — ABNORMAL HIGH (ref 0.61–1.24)
GFR, Estimated: 18 mL/min — ABNORMAL LOW (ref >60)
Glucose, Bld: 119 mg/dL — ABNORMAL HIGH (ref 70–99)
Potassium: 4.8 mmol/L (ref 3.5–5.1)
Sodium: 134 mmol/L — ABNORMAL LOW (ref 135–145)
Total Bilirubin: 0.9 mg/dL (ref 0.3–1.2)
Total Protein: 7.7 g/dL (ref 6.5–8.1)

## 2020-08-24 LAB — RESP PANEL BY RT-PCR (FLU A&B, COVID) ARPGX2
Influenza A by PCR: NEGATIVE
Influenza B by PCR: NEGATIVE
SARS Coronavirus 2 by RT PCR: NEGATIVE

## 2020-08-24 LAB — MAGNESIUM
Magnesium: 0.7 mg/dL — CL (ref 1.7–2.4)
Magnesium: 1 mg/dL — ABNORMAL LOW (ref 1.7–2.4)

## 2020-08-24 LAB — LIPASE, BLOOD: Lipase: 62 U/L — ABNORMAL HIGH (ref 11–51)

## 2020-08-24 IMAGING — US US RENAL
1 series · 14 of 25 positions shown · non-contrast
Comparison: None.

CLINICAL DATA: Acute kidney injury

EXAM:
RENAL / URINARY TRACT ULTRASOUND COMPLETE

[Series 1: us renal · 14 of 54 slices shown]
[im 1/54]
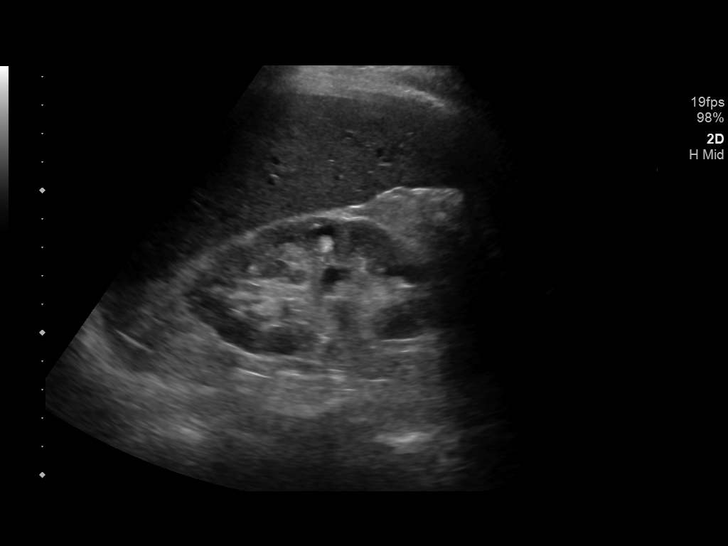
[im 5/54]
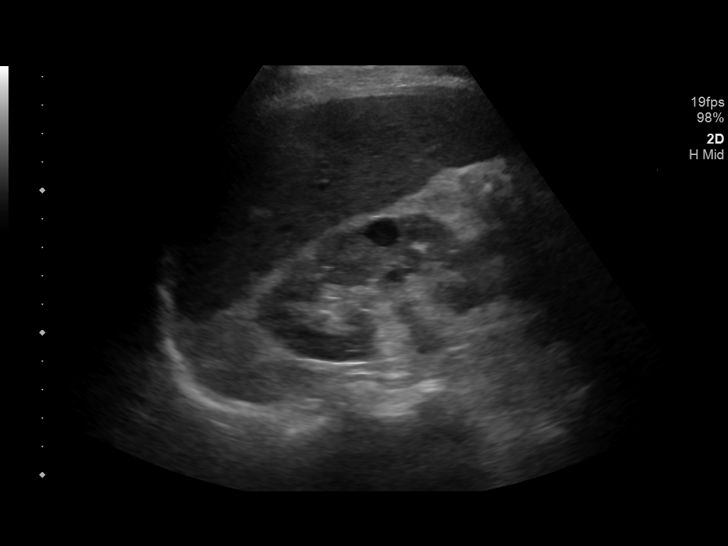
[im 9/54]
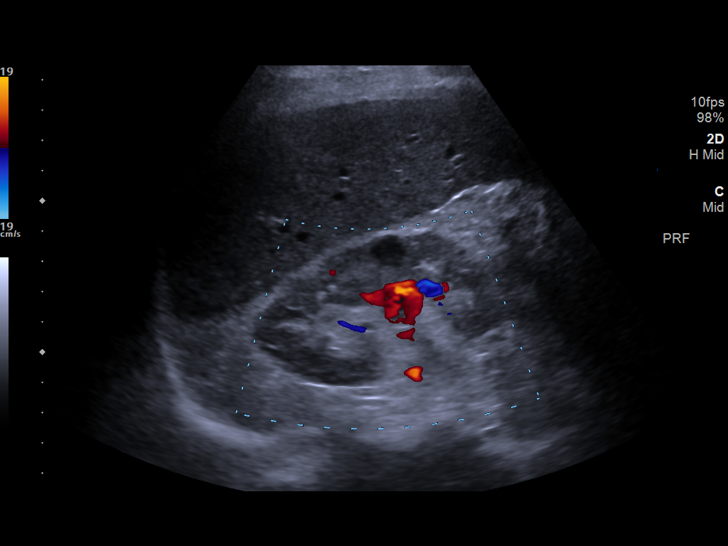
[im 14/54]
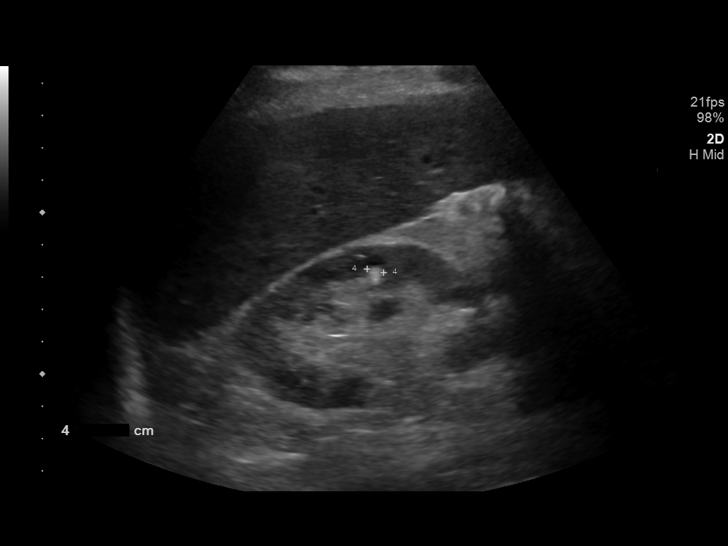
[im 18/54]
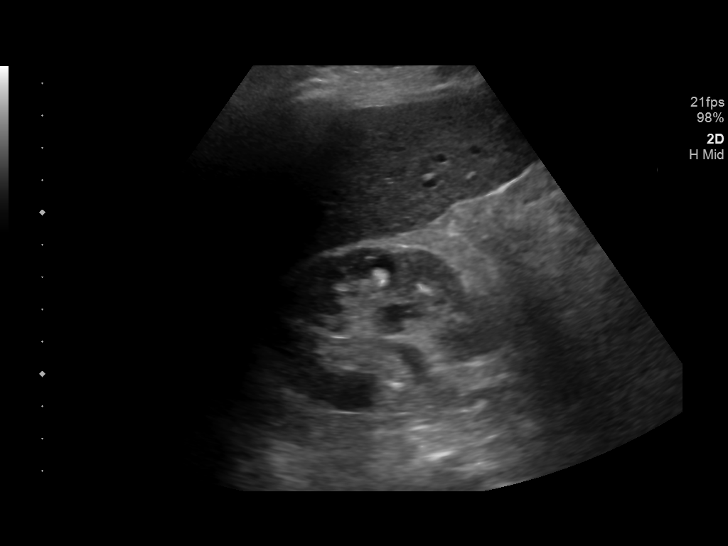
[im 20/54]
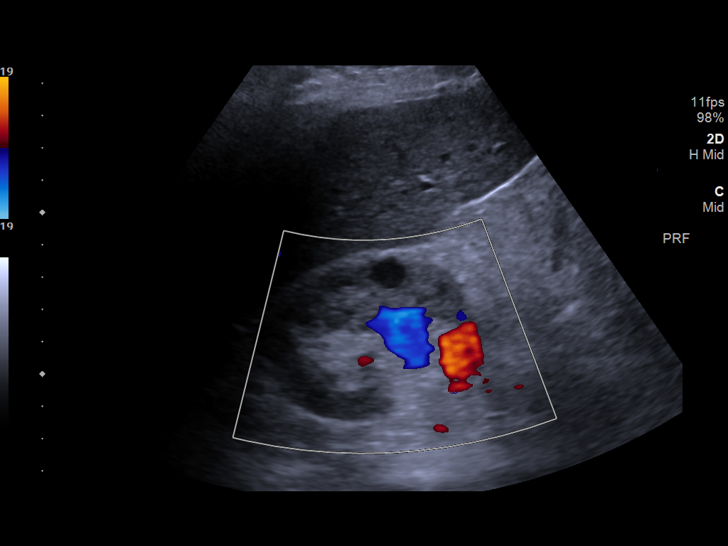
[im 25/54]
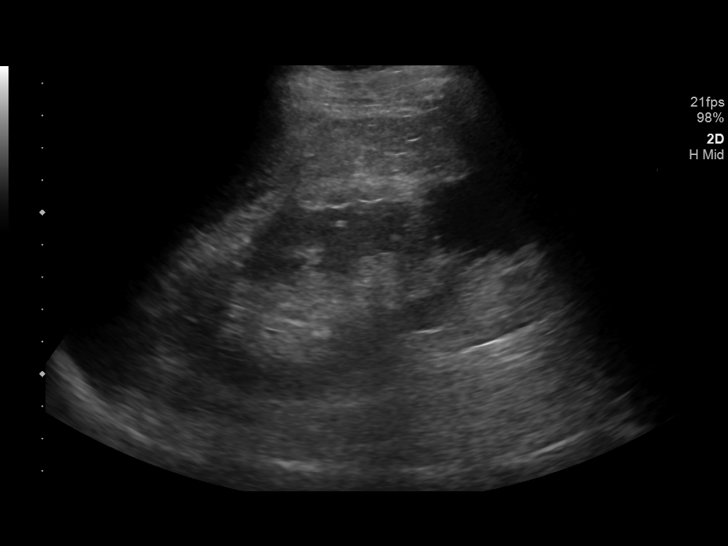
[im 29/54]
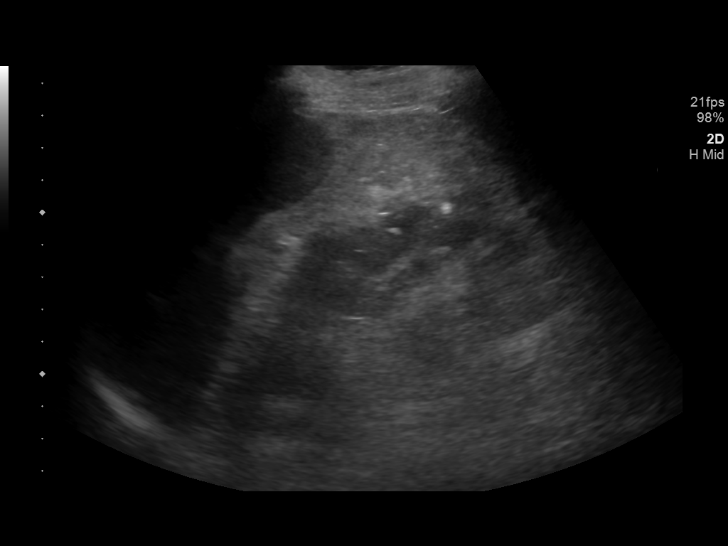
[im 34/54]
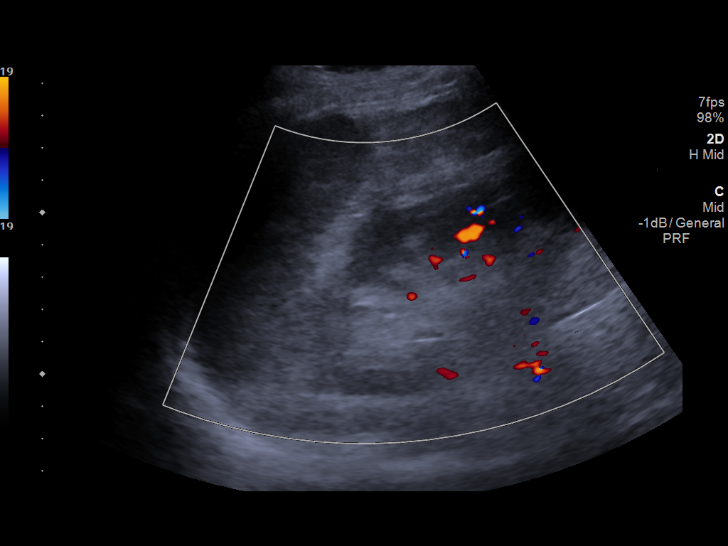
[im 36/54]
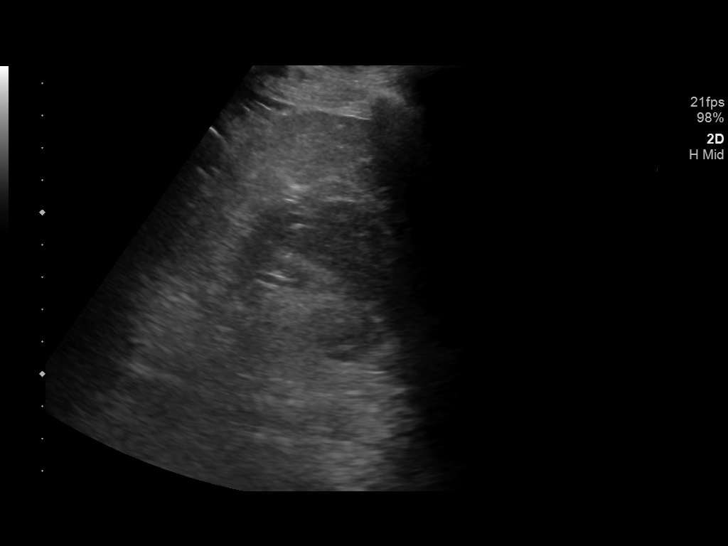
[im 40/54]
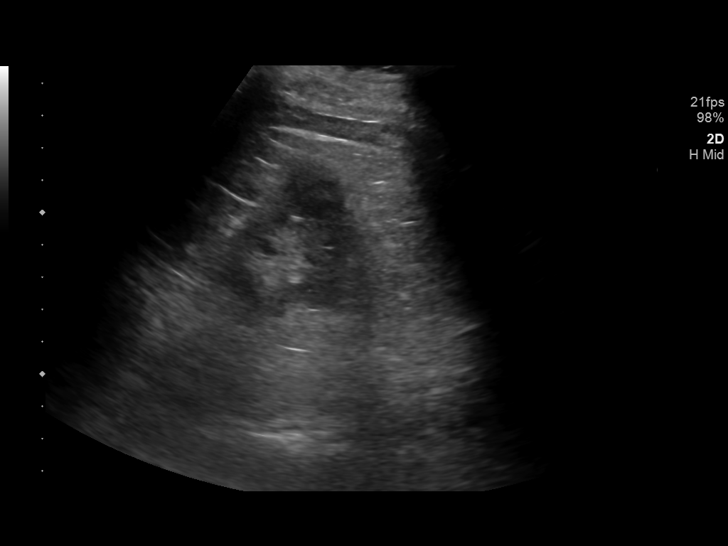
[im 45/54]
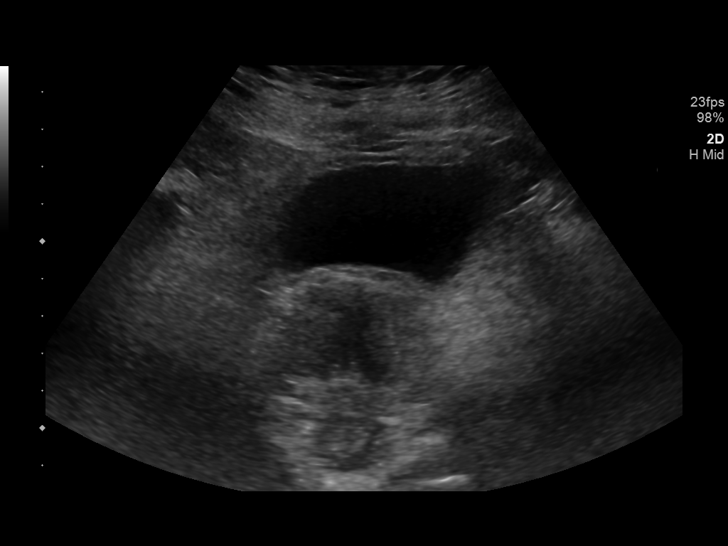
[im 49/54]
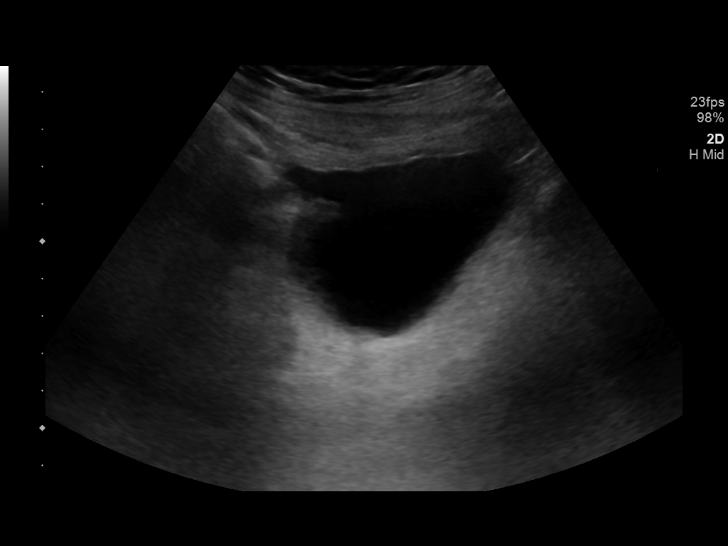
[im 54/54]
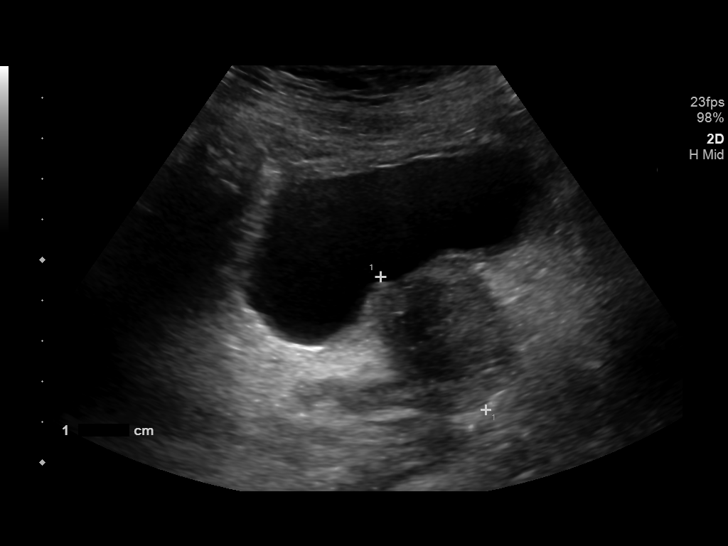

[14 of 25 positions shown; findings below may reference images not displayed]

FINDINGS: Right Kidney:

Renal measurements: 9.9 x 4.7 x 6.0 cm = volume: 143 mL.
Echogenicity within normal limits. Shadowing 5 mm calculi seen
within the midpole. There is also a anechoic cyst seen within the
lower pole measuring 1.3 x 1.1 x 1.2 cm. No hydronephrosis is noted.

Left Kidney:

Renal measurements: 10.0 x 5.7 x 5.6 cm = volume: 165 mL.
Echogenicity within normal limits. There is a anechoic cystic lesion
seen off the lower pole of the left kidney measuring 4.1 x 3.3 x
cm.

Bladder:

Appears normal for degree of bladder distention.

Other:

Heterogeneously enlarged prostate gland measuring 4.3 x 3.4 x 4.2 cm
IMPRESSION: Nonobstructing 5 mm right renal calculus.

Bilateral renal cysts

Prostatomegaly

## 2020-08-24 IMAGING — DX DG CHEST 1V PORT
1 series · 1 of 1 positions shown · non-contrast
Comparison: [DATE].

CLINICAL DATA: Weakness.

EXAM:
PORTABLE CHEST 1 VIEW

[chest ap]
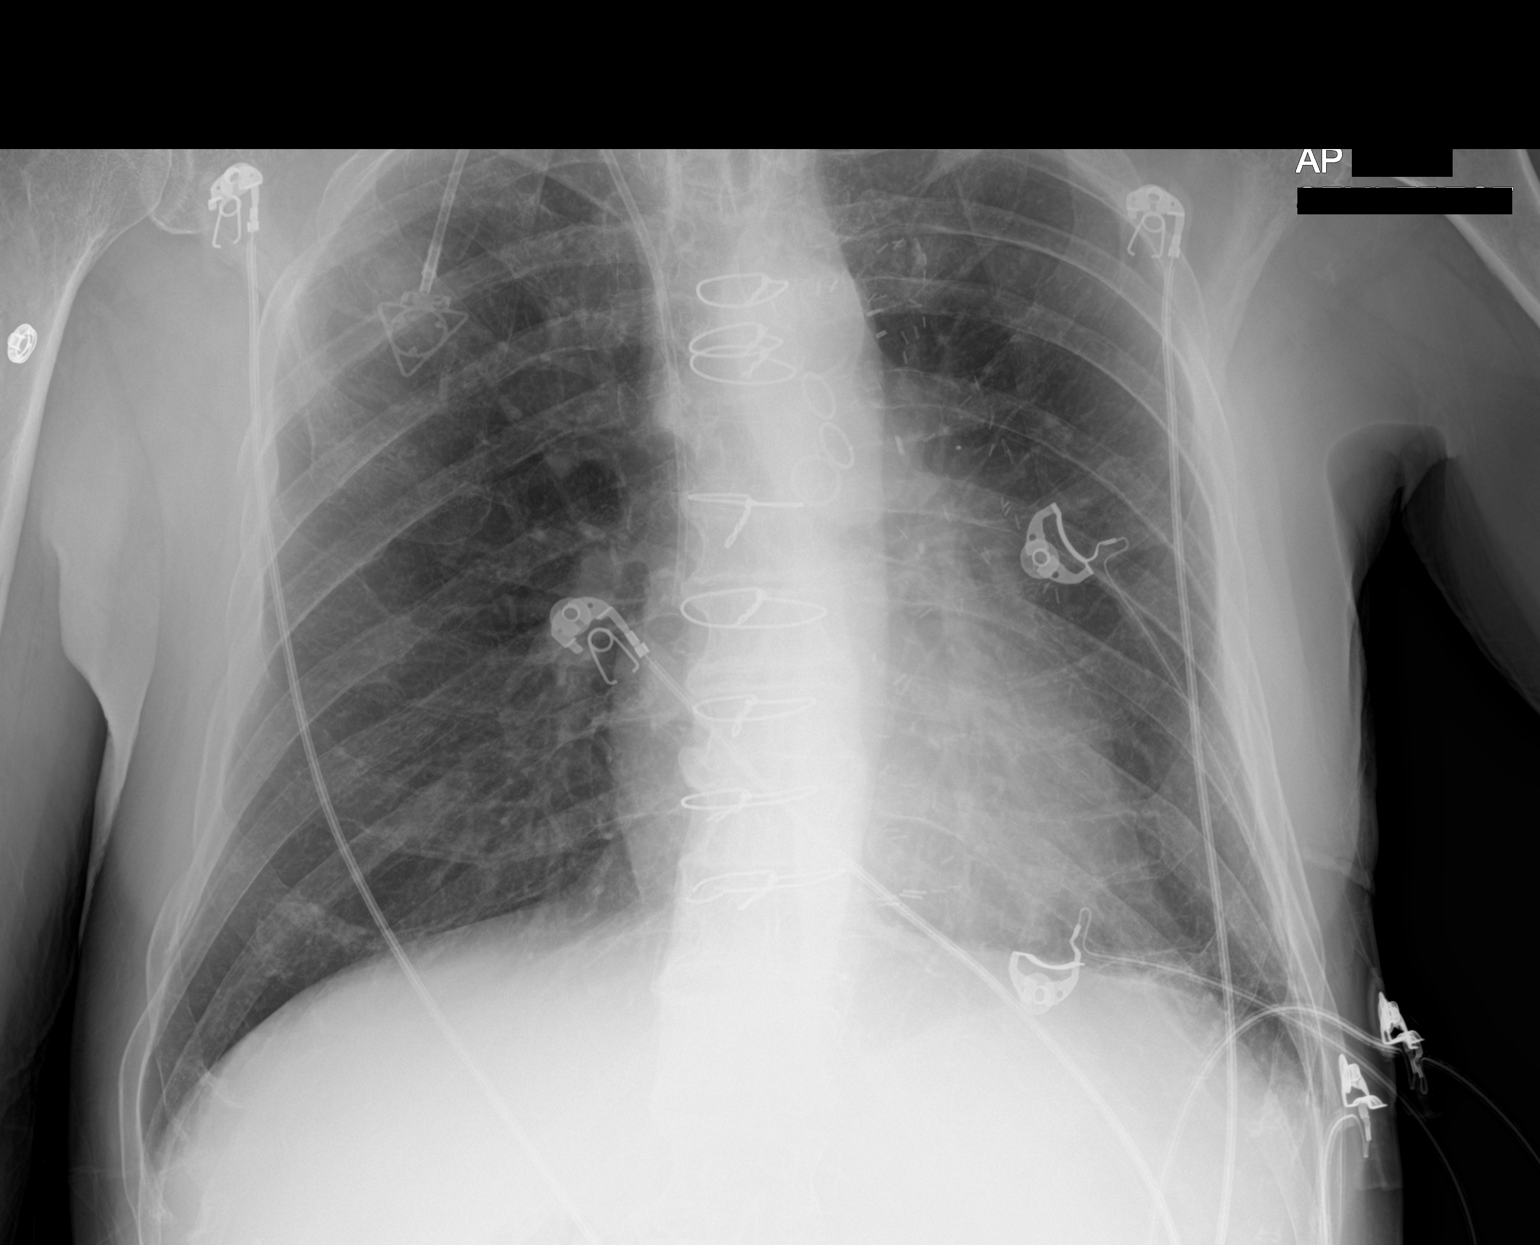

[1 of 1 positions shown; findings below may reference images not displayed]

FINDINGS: The heart size and mediastinal contours are within normal limits.
Status post coronary bypass graft. Right internal jugular
Port-A-Cath is noted with distal tip in expected position of
cavoatrial junction. No pneumothorax or pleural effusion is noted.
Both lungs are clear. The visualized skeletal structures are
unremarkable.
IMPRESSION: No active disease.

## 2020-08-24 MED ORDER — SODIUM CHLORIDE 0.9 % IV SOLN: INTRAVENOUS | Status: DC

## 2020-08-24 MED ORDER — MAGNESIUM OXIDE 400 (241.3 MG) MG PO TABS
200.0000 mg | ORAL_TABLET | Freq: Every day | ORAL | Status: DC
  Administered 2020-08-25 – 2020-08-26 (×2): 200 mg via ORAL
  Filled 2020-08-24 (×2): qty 1

## 2020-08-24 MED ORDER — VITAMIN E 45 MG (100 UNIT) PO CAPS
100.0000 [IU] | ORAL_CAPSULE | Freq: Every day | ORAL | Status: DC
  Administered 2020-08-25 – 2020-08-26 (×2): 100 [IU] via ORAL
  Filled 2020-08-24 (×2): qty 1

## 2020-08-24 MED ORDER — CALCIUM CARBONATE-VITAMIN D 500-200 MG-UNIT PO TABS
1.0000 | ORAL_TABLET | Freq: Every day | ORAL | Status: DC
  Administered 2020-08-25 – 2020-08-26 (×2): 1 via ORAL
  Filled 2020-08-24 (×2): qty 1

## 2020-08-24 MED ORDER — ASCORBIC ACID 500 MG PO TABS
500.0000 mg | ORAL_TABLET | Freq: Every day | ORAL | Status: DC
  Administered 2020-08-25 – 2020-08-26 (×2): 500 mg via ORAL
  Filled 2020-08-24 (×2): qty 1

## 2020-08-24 MED ORDER — ROSUVASTATIN CALCIUM 20 MG PO TABS
20.0000 mg | ORAL_TABLET | Freq: Every day | ORAL | Status: DC
  Administered 2020-08-25 – 2020-08-26 (×2): 20 mg via ORAL
  Filled 2020-08-24 (×2): qty 1

## 2020-08-24 MED ORDER — NITROGLYCERIN 0.4 MG SL SUBL: 0.4000 mg | SUBLINGUAL_TABLET | SUBLINGUAL | Status: DC | PRN

## 2020-08-24 MED ORDER — ATENOLOL 50 MG PO TABS
50.0000 mg | ORAL_TABLET | Freq: Every day | ORAL | Status: DC
  Administered 2020-08-25 – 2020-08-26 (×2): 50 mg via ORAL
  Filled 2020-08-24 (×2): qty 1

## 2020-08-24 MED ORDER — VITAMIN B-12 100 MCG PO TABS
50.0000 ug | ORAL_TABLET | Freq: Every day | ORAL | Status: DC
  Administered 2020-08-25 – 2020-08-26 (×2): 50 ug via ORAL
  Filled 2020-08-24 (×2): qty 1

## 2020-08-24 MED ORDER — SODIUM CHLORIDE 0.9 % IV BOLUS
1000.0000 mL | Freq: Once | INTRAVENOUS | Status: AC
  Administered 2020-08-24: 1000 mL via INTRAVENOUS

## 2020-08-24 MED ORDER — ASPIRIN 325 MG PO TABS
325.0000 mg | ORAL_TABLET | Freq: Every day | ORAL | Status: DC
  Administered 2020-08-25 – 2020-08-26 (×2): 325 mg via ORAL
  Filled 2020-08-24 (×2): qty 1

## 2020-08-24 MED ORDER — HEPARIN SODIUM (PORCINE) 5000 UNIT/ML IJ SOLN
5000.0000 [IU] | Freq: Three times a day (TID) | INTRAMUSCULAR | Status: DC
  Administered 2020-08-24 – 2020-08-26 (×5): 5000 [IU] via SUBCUTANEOUS
  Filled 2020-08-24 (×5): qty 1

## 2020-08-24 MED ORDER — CALCIUM POLYCARBOPHIL 625 MG PO TABS
625.0000 mg | ORAL_TABLET | Freq: Every day | ORAL | Status: DC
  Administered 2020-08-24 – 2020-08-26 (×3): 625 mg via ORAL
  Filled 2020-08-24 (×3): qty 1

## 2020-08-24 MED ORDER — CALCIUM GLUCONATE-NACL 2-0.675 GM/100ML-% IV SOLN
2.0000 g | Freq: Once | INTRAVENOUS | Status: AC
  Administered 2020-08-24: 2000 mg via INTRAVENOUS
  Filled 2020-08-24: qty 100

## 2020-08-24 MED ORDER — LOPERAMIDE HCL 2 MG PO CAPS
2.0000 mg | ORAL_CAPSULE | Freq: Four times a day (QID) | ORAL | Status: DC
  Administered 2020-08-24 – 2020-08-26 (×7): 2 mg via ORAL
  Filled 2020-08-24 (×7): qty 1

## 2020-08-24 MED ORDER — MAGNESIUM SULFATE 4 GM/100ML IV SOLN
4.0000 g | Freq: Once | INTRAVENOUS | Status: AC
  Administered 2020-08-24: 4 g via INTRAVENOUS
  Filled 2020-08-24: qty 100

## 2020-08-24 NOTE — ED Triage Notes (Signed)
Patient reports that he took his BP at home and it was "mid 90's over 60." Patient states he last received chemo 3 months ago. patient does c/o dizziness.

## 2020-08-24 NOTE — ED Provider Notes (Signed)
[facility] DEPT Provider Note   CSN: [id number] Arrival date & time: 08/24/20  1111     History Chief Complaint  Patient presents with  . Hypotension    Jared White is a 75 y.o. male.  Patient here with concern for dehydration.  History of colon cancer, status post ostomy and chemotherapy.  Has been getting some IV fluid treatment the last several weeks but that has stopped.  He has had some increased output from his ostomy.  No nausea or vomiting or abdominal pain.  Denies any fevers or chills.  He felt some weakness when he gets up to walk around.  His blood pressure was in the 90s yesterday.  He feels like he needs some IV fluids.  The history is provided by the patient.  Weakness Severity:  Mild Onset quality:  Gradual Timing:  Constant Progression:  Unchanged Chronicity:  New Context: dehydration   Relieved by:  Nothing Worsened by:  Nothing Associated symptoms: diarrhea and nausea   Associated symptoms: no abdominal pain, no arthralgias, no chest pain, no cough, no dysuria, no fever, no seizures, no shortness of breath and no vomiting        Past Medical History:  Diagnosis Date  . Arthritis   . Broken back   . CAD (coronary artery disease)    PCI & CABG  . CHF (congestive heart failure) ([place])   . Diverticulitis    mild - approx 2004  . Dysrhythmia    occasional arrythmias  . Family history of heart disease   . GERD (gastroesophageal reflux disease)   . History of nuclear stress test 06/30/2011   lexiscan; normal pattern of perfusion post-stress; low risk scan   . Hyperlipidemia   . Hypertension   . IgA nephropathy   . Irregular heart beat   . Pre-diabetes   . Rectal cancer ([place]) 08/19/2019  . Systemic hypertension     Patient Active Problem List   Diagnosis Date Noted  . Preop cardiovascular exam 03/19/2020  . Gastric polyps 02/07/2020  . History of rectal cancer 02/07/2020  . History of colonic polyps 02/07/2020  .  Barrett's esophagus without dysplasia 02/07/2020  . Port-A-Cath in place 11/27/2019  . Weight loss 11/27/2019  . Rectal cancer ([place]) 08/19/2019  . Abnormal MRI, pelvis 07/26/2019  . Abnormal CT scan, pelvis 07/26/2019  . Rectal bleeding 07/26/2019  . Hemorrhoids 07/26/2019  . Constipation 07/26/2019  . Stage 3b chronic kidney disease ([place]) 09/13/2018  . Coronary artery disease involving native coronary artery of native heart without angina pectoris 09/13/2018  . Microalbuminuria 10/16/2015  . Facial numbness   . Hypomagnesemia   . Hypocalcemia 10/08/2015  . Psoriasis 11/21/2013  . S/P CABG x 4 03/14/2013  . Multiple thoracic/lumbar transverse process fractures 12/15/2012  . Fall from roof 12/15/2012  . CAD (coronary artery disease) 12/15/2012  . GERD (gastroesophageal reflux disease) 12/15/2012  . Essential hypertension 12/15/2012  . Hyperlipidemia 12/15/2012  . Multiple bilateral rib fractures     Past Surgical History:  Procedure Laterality Date  . CARDIAC CATHETERIZATION  08/1995   PTCA of OM (Dr. Marella Chimes)  . CORONARY ARTERY BYPASS GRAFT  01/2000   x5 - LIMA to LAD, SVG to diagonal, sequential SVG to ramus & OM, SVG to PDA (Dr. Tharon Aquas Trigt)  . DIVERTING ILEOSTOMY N/A 06/10/2020   Procedure: DIVERTING LOOP ILEOSTOMY;  Surgeon: Leighton Ruff, MD;  Location: [facility] ORS;  Service: General;  Laterality: N/A;  . IR IMAGING  GUIDED PORT INSERTION  09/04/2019  . LUNG LOBECTOMY     left upper lobe  . TRANSTHORACIC ECHOCARDIOGRAM  12/24/2012   EF 55-60%, mild LVH, mild conc hypertrophy; mild MR; LA mildly dilated  . XI ROBOTIC ASSISTED LOWER ANTERIOR RESECTION N/A 06/10/2020   Procedure: XI ROBOTIC ASSISTED LOWER ANTERIOR RESECTION, MOBILIZATION OF SPLENIC FLEXURE, RIGID PROCTOSCOPY;  Surgeon: Leighton Ruff, MD;  Location: [facility] ORS;  Service: General;  Laterality: N/A;       Family History  Problem Relation Age of Onset  . Heart attack Father   . Kidney failure Brother   .  Prostate cancer Brother   . Heart attack Brother   . Heart disease Brother   . Hypertension Brother   . Heart disease Brother   . Hypertension Brother   . Colon cancer Neg Hx   . Esophageal cancer Neg Hx   . Rectal cancer Neg Hx   . Stomach cancer Neg Hx   . Inflammatory bowel disease Neg Hx   . Liver disease Neg Hx   . Pancreatic cancer Neg Hx     Social History   Tobacco Use  . Smoking status: Never Smoker  . Smokeless tobacco: Never Used  Vaping Use  . Vaping Use: Never used  Substance Use Topics  . Alcohol use: Not Currently  . Drug use: No    Home Medications Prior to Admission medications   Medication Sig Start Date End Date Taking? Authorizing Provider  ascorbic acid (VITAMIN C) 500 MG tablet Take 500 mg by mouth daily.    [provider]  aspirin EC 81 MG tablet Take 1 tablet (81 mg total) by mouth daily. 09/13/18   Patwardhan, Manish J, MD  atenolol (TENORMIN) 50 MG tablet TAKE 1 TABLET BY MOUTH EVERY DAY 04/20/20   Patwardhan, Manish J, MD  Calcium 600-200 MG-UNIT tablet Take 1 tablet by mouth daily.    [provider]  loperamide (IMODIUM) 2 MG capsule TAKE 1 CAPSULE BY MOUTH 4 TIMES DAILY BEFORE MEALS AND AT BEDTIME 06/16/20 2/69/48  Leighton Ruff, MD  Magnesium 250 MG TABS Take 250 mg by mouth daily.    [provider]  naproxen sodium (ALEVE) 220 MG tablet Take 220-440 mg by mouth 2 (two) times daily as needed (aches/pain.).    [provider]  nitroGLYCERIN (NITROSTAT) 0.4 MG SL tablet Place 0.4 mg under the tongue every 5 (five) minutes x 3 doses as needed for chest pain. 11/16/19   [provider]  omeprazole (PRILOSEC) 40 MG capsule TAKE 1 CAPSULE BY MOUTH TWICE A DAY 07/30/20   Mansouraty, Telford Nab., MD  polycarbophil (FIBERCON) 625 MG tablet Take 1 tablet (625 mg total) by mouth 3 (three) times daily before meals. 5/46/27   Leighton Ruff, MD  rosuvastatin (CRESTOR) 20 MG tablet TAKE 1 TABLET BY MOUTH EVERY DAY  07/20/20   Patwardhan, Manish J, MD  sodium chloride 0.9 % Inject 1,000 mLs into the vein every 3 (three) days. 0/35/00   Leighton Ruff, MD  vitamin X-38 (CYANOCOBALAMIN) 50 MCG tablet Take 50 mcg by mouth daily.    [provider]  vitamin E 45 MG (100 UNITS) capsule Take 100 Units by mouth daily.    [provider]    Allergies    Patient has no known allergies.  Review of Systems   Review of Systems  Constitutional: Negative for chills and fever.  HENT: Negative for ear pain and sore throat.   Eyes: Negative for pain  and visual disturbance.  Respiratory: Negative for cough and shortness of breath.   Cardiovascular: Negative for chest pain and palpitations.  Gastrointestinal: Positive for diarrhea and nausea. Negative for abdominal pain and vomiting.  Genitourinary: Negative for dysuria and hematuria.  Musculoskeletal: Negative for arthralgias and back pain.  Skin: Negative for color change and rash.  Neurological: Positive for weakness. Negative for seizures and syncope.  All other systems reviewed and are negative.   Physical Exam Updated Vital Signs BP 118/65   Pulse (!) 109   Temp (!) 97.5 F (36.4 C) (Oral)   Resp (!) 22   Ht 5\' 8"  (1.727 m)   Wt 61.7 kg   SpO2 100%   BMI 20.68 kg/m   Physical Exam Vitals and nursing note reviewed.  Constitutional:      Appearance: He is well-developed.  HENT:     Head: Normocephalic and atraumatic.     Mouth/Throat:     Mouth: Mucous membranes are dry.  Eyes:     Conjunctiva/sclera: Conjunctivae normal.  Cardiovascular:     Rate and Rhythm: Normal rate and regular rhythm.     Pulses: Normal pulses.     Heart sounds: Normal heart sounds. No murmur heard.   Pulmonary:     Effort: Pulmonary effort is normal. No respiratory distress.     Breath sounds: Normal breath sounds.  Abdominal:     Palpations: Abdomen is soft.     Tenderness: There is no abdominal tenderness.     Comments: Watery stool in ostomy  that is brown  Musculoskeletal:     Cervical back: Neck supple.  Skin:    General: Skin is warm and dry.     Capillary Refill: Capillary refill takes less than 2 seconds.  Neurological:     General: No focal deficit present.     Mental Status: He is alert and oriented to person, place, and time.     Cranial Nerves: No cranial nerve deficit.     Sensory: No sensory deficit.     Motor: No weakness.     Coordination: Coordination normal.     ED Results / Procedures / Treatments   Labs (all labs ordered are listed, but only abnormal results are displayed) Labs Reviewed  CBC WITH DIFFERENTIAL/PLATELET - Abnormal; Notable for the following components:      Result Value   RBC 4.13 (*)    Hemoglobin 12.9 (*)    HCT 38.7 (*)    Platelets 143 (*)    All other components within normal limits  COMPREHENSIVE METABOLIC PANEL - Abnormal; Notable for the following components:   Sodium 134 (*)    CO2 17 (*)    Glucose, Bld 119 (*)    BUN 93 (*)    Creatinine, Ser 3.51 (*)    Calcium 7.4 (*)    GFR, Estimated 18 (*)    All other components within normal limits  LIPASE, BLOOD - Abnormal; Notable for the following components:   Lipase 62 (*)    All other components within normal limits  RESP PANEL BY RT-PCR (FLU A&B, COVID) ARPGX2  URINALYSIS, ROUTINE W REFLEX MICROSCOPIC    EKG None  Radiology DG Chest Portable 1 View  Result Date: 08/24/2020 CLINICAL DATA:  Weakness. EXAM: PORTABLE CHEST 1 VIEW COMPARISON:  July 07, 2017. FINDINGS: The heart size and mediastinal contours are within normal limits. Status post coronary bypass graft. Right internal jugular Port-A-Cath is noted with distal tip in expected position of cavoatrial junction.  No pneumothorax or pleural effusion is noted. Both lungs are clear. The visualized skeletal structures are unremarkable. IMPRESSION: No active disease. Electronically Signed   By: Marijo Conception M.D.   On: 08/24/2020 11:58    Procedures .Critical  Care Performed by: Lennice Sites, DO Authorized by: Lennice Sites, DO   Critical care provider statement:    Critical care time (minutes):  35   Critical care was necessary to treat or prevent imminent or life-threatening deterioration of the following conditions:  Renal failure and dehydration   Critical care was time spent personally by me on the following activities:  Blood draw for specimens, development of treatment plan with patient or surrogate, discussions with primary provider, evaluation of patient's response to treatment, examination of patient, obtaining history from patient or surrogate, ordering and performing treatments and interventions, ordering and review of laboratory studies, ordering and review of radiographic studies, pulse oximetry, re-evaluation of patient's condition and review of old charts   I assumed direction of critical care for this patient from another provider in my specialty: no     Care discussed with: admitting provider       Medications Ordered in ED Medications  sodium chloride 0.9 % bolus 1,000 mL (has no administration in time range)  sodium chloride 0.9 % bolus 1,000 mL (1,000 mLs Intravenous New Bag/Given 08/24/20 1206)    ED Course  I have reviewed the triage vital signs and the nursing notes.  Pertinent labs & imaging results that were available during my care of the patient were reviewed by me and considered in my medical decision making (see chart for details).    MDM Rules/Calculators/A&P                          Petr Bontempo Marich is a 75 year old male with history of colon cancer status post ostomy and chemotherapy presents the ED with weakness and concern for dehydration.  Unremarkable vitals.  No fever.  Well-appearing.  No abdominal tenderness.  Loose stool in his ostomy.  States he has had increased ostomy output.  Feels dehydrated.  Had been getting IV fluids twice a week at home for the last several weeks.  Feels like he is not able to keep  up with hydration and ostomy output.  Feels weak when he is up walking around at times.  His blood pressure was in the 90s yesterday.  Overall suspect may be some dehydration as he clinically looks dry.  Will give fluid bolus and check basic labs.  Creatinine elevated to 3.5.  Otherwise lab work unremarkable.  Acute kidney injury likely in the setting of dehydration from GI losses.  Given AKI will admit for further hydration and following of creatinine.  Given 2 L IV fluids to start with in the ED.  Admitted to medicine in stable condition.  This chart was dictated using voice recognition software.  Despite best efforts to proofread,  errors can occur which can change the documentation meaning.   Final Clinical Impression(s) / ED Diagnoses Final diagnoses:  AKI (acute kidney injury) ([place])  Dehydration    Rx / DC Orders ED Discharge Orders    None       Lennice Sites, DO 08/24/20 1311

## 2020-08-24 NOTE — H&P (Signed)
History and Physical    Jared White CVE:[id number] DOB: 03-20-1946 DOA: 08/24/2020  PCP: [name], MD  Patient coming from: Home  Chief Complaint: dehydration  HPI: Jared White is a 75 y.o. male with medical history significant of rectal CA, HTN, HLD. Presenting with dehydration. He reports that he was feeling cramps in his hands and legs over the past day or so. Along with this, he felt generally weak and had dry mouth. He thought he was getting dehydrated as he was noticing an increase in his ostomy output. He tried drinking more fluids, but it didn't help. He tried to get in with his PCP, but was unable to be seen. So today he decided to come to the ED. He denies any other aggravating or alleviating factors.    ED Course: He was found to have AKI. He was given fluids. TRH was called for admission.   Review of Systems:  Denies CP, dyspnea, palpitations, N/V, hematemesis, hematochezia. Reports fatigue. Review of systems is otherwise negative for all not mentioned in HPI.   PMHx Past Medical History:  Diagnosis Date  . Arthritis   . Broken back   . CAD (coronary artery disease)    PCI & CABG  . CHF (congestive heart failure) ([place])   . Diverticulitis    mild - approx 2004  . Dysrhythmia    occasional arrythmias  . Family history of heart disease   . GERD (gastroesophageal reflux disease)   . History of nuclear stress test 06/30/2011   lexiscan; normal pattern of perfusion post-stress; low risk scan   . Hyperlipidemia   . Hypertension   . IgA nephropathy   . Irregular heart beat   . Pre-diabetes   . Rectal cancer ([place]) 08/19/2019  . Systemic hypertension     PSHx Past Surgical History:  Procedure Laterality Date  . CARDIAC CATHETERIZATION  08/1995   PTCA of OM (Dr. Marella Chimes)  . CORONARY ARTERY BYPASS GRAFT  01/2000   x5 - LIMA to LAD, SVG to diagonal, sequential SVG to ramus & OM, SVG to PDA (Dr. Tharon Aquas Trigt)  . DIVERTING ILEOSTOMY N/A 06/10/2020   Procedure:  DIVERTING LOOP ILEOSTOMY;  Surgeon: Leighton Ruff, MD;  Location: [facility];  Service: General;  Laterality: N/A;  . IR IMAGING GUIDED PORT INSERTION  09/04/2019  . LUNG LOBECTOMY     left upper lobe  . TRANSTHORACIC ECHOCARDIOGRAM  12/24/2012   EF 55-60%, mild LVH, mild conc hypertrophy; mild MR; LA mildly dilated  . XI ROBOTIC ASSISTED LOWER ANTERIOR RESECTION N/A 06/10/2020   Procedure: XI ROBOTIC ASSISTED LOWER ANTERIOR RESECTION, MOBILIZATION OF SPLENIC FLEXURE, RIGID PROCTOSCOPY;  Surgeon: Leighton Ruff, MD;  Location: [facility];  Service: General;  Laterality: N/A;    SocHx  reports that he has never smoked. He has never used smokeless tobacco. He reports previous alcohol use. He reports that he does not use drugs.  No Known Allergies  FamHx Family History  Problem Relation Age of Onset  . Heart attack Father   . Kidney failure Brother   . Prostate cancer Brother   . Heart attack Brother   . Heart disease Brother   . Hypertension Brother   . Heart disease Brother   . Hypertension Brother   . Colon cancer Neg Hx   . Esophageal cancer Neg Hx   . Rectal cancer Neg Hx   . Stomach cancer Neg Hx   . Inflammatory bowel disease Neg Hx   .  Liver disease Neg Hx   . Pancreatic cancer Neg Hx     Prior to Admission medications   Medication Sig Start Date End Date Taking? Authorizing Provider  ascorbic acid (VITAMIN C) 500 MG tablet Take 500 mg by mouth daily.   Yes [provider]  aspirin 325 MG tablet Take 325 mg by mouth daily.   Yes [provider]  atenolol (TENORMIN) 50 MG tablet TAKE 1 TABLET BY MOUTH EVERY DAY Patient taking differently: Take 50 mg by mouth daily. 04/20/20  Yes Patwardhan, Reynold Bowen, MD  Calcium 600-200 MG-UNIT tablet Take 1 tablet by mouth daily.   Yes [provider]  loperamide (IMODIUM) 2 MG capsule TAKE 1 CAPSULE BY MOUTH 4 TIMES DAILY BEFORE MEALS AND AT BEDTIME Patient taking differently: Take 2 mg by mouth 4 (four) times daily.  06/16/20 01/13/22 Yes Leighton Ruff, MD  Magnesium 250 MG TABS Take 250 mg by mouth daily.   Yes [provider]  nitroGLYCERIN (NITROSTAT) 0.4 MG SL tablet Place 0.4 mg under the tongue every 5 (five) minutes x 3 doses as needed for chest pain. 11/16/19  Yes [provider]  omeprazole (PRILOSEC) 40 MG capsule TAKE 1 CAPSULE BY MOUTH TWICE A DAY Patient taking differently: Take 40 mg by mouth daily. 07/30/20  Yes Mansouraty, Telford Nab., MD  rosuvastatin (CRESTOR) 20 MG tablet TAKE 1 TABLET BY MOUTH EVERY DAY Patient taking differently: Take 20 mg by mouth daily. 07/20/20  Yes Patwardhan, Manish J, MD  traMADol (ULTRAM) 50 MG tablet Take 50 mg by mouth every 6 (six) hours as needed for pain. 06/30/20  Yes [provider]  valsartan (DIOVAN) 160 MG tablet Take 160 mg by mouth daily. 08/20/20  Yes [provider]  vitamin B-12 (CYANOCOBALAMIN) 50 MCG tablet Take 50 mcg by mouth daily.   Yes [provider]  vitamin E 45 MG (100 UNITS) capsule Take 100 Units by mouth daily.   Yes [provider]  aspirin EC 81 MG tablet Take 1 tablet (81 mg total) by mouth daily. Patient not taking: Reported on 08/24/2020 09/13/18   Nigel Mormon, MD  polycarbophil (FIBERCON) 625 MG tablet Take 1 tablet (625 mg total) by mouth 3 (three) times daily before meals. Patient not taking: Reported on 08/24/2020 5/36/14   Leighton Ruff, MD  sodium chloride 0.9 % Inject 1,000 mLs into the vein every 3 (three) days. Patient not taking: Reported on 08/24/2020 4/31/54   Leighton Ruff, MD    Physical Exam: Vitals:   08/24/20 1117 08/24/20 1119 08/24/20 1210 08/24/20 1300  BP:  136/80 (!) 106/93 118/65  Pulse:  69 (!) 58 (!) 109  Resp:  18 17 (!) 22  Temp:  (!) 97.5 F (36.4 C)    TempSrc:  Oral    SpO2:  100% 100% 100%  Weight: 61.7 kg     Height: 5\' 8"  (1.727 m)       General: 75 y.o. male resting in bed in NAD Eyes: PERRL, normal sclera ENMT: Nares patent w/o  discharge, orophaynx clear, dentition normal, ears w/o discharge/lesions/ulcers Neck: Supple, trachea midline Cardiovascular: RRR, +S1, S2, no m/g/r, equal pulses throughout Respiratory: CTABL, no w/r/r, normal WOB GI: BS+, NDNT, no masses noted, ostomy with thin, brown fluid MSK: No e/c/c Skin: No rashes, bruises, ulcerations noted Neuro: A&O x 3, no focal deficits Psyc: Appropriate interaction and affect, calm/cooperative  Labs on Admission: I have personally reviewed following labs and imaging studies  CBC: Recent  Labs  Lab 08/24/20 1204  WBC 6.7  NEUTROABS 5.1  HGB 12.9*  HCT 38.7*  MCV 93.7  PLT 604*   Basic Metabolic Panel: Recent Labs  Lab 08/24/20 1204  NA 134*  K 4.8  CL 105  CO2 17*  GLUCOSE 119*  BUN 93*  CREATININE 3.51*  CALCIUM 7.4*   GFR: Estimated Creatinine Clearance: 16.1 mL/min (A) (by C-G formula based on SCr of 3.51 mg/dL (H)). Liver Function Tests: Recent Labs  Lab 08/24/20 1204  AST 18  ALT 17  ALKPHOS 81  BILITOT 0.9  PROT 7.7  ALBUMIN 4.2   Recent Labs  Lab 08/24/20 1204  LIPASE 62*   No results for input(s): AMMONIA in the last 168 hours. Coagulation Profile: No results for input(s): INR, PROTIME in the last 168 hours. Cardiac Enzymes: No results for input(s): CKTOTAL, CKMB, CKMBINDEX, TROPONINI in the last 168 hours. BNP (last 3 results) No results for input(s): PROBNP in the last 8760 hours. HbA1C: No results for input(s): HGBA1C in the last 72 hours. CBG: No results for input(s): GLUCAP in the last 168 hours. Lipid Profile: No results for input(s): CHOL, HDL, LDLCALC, TRIG, CHOLHDL, LDLDIRECT in the last 72 hours. Thyroid Function Tests: No results for input(s): TSH, T4TOTAL, FREET4, T3FREE, THYROIDAB in the last 72 hours. Anemia Panel: No results for input(s): VITAMINB12, FOLATE, FERRITIN, TIBC, IRON, RETICCTPCT in the last 72 hours. Urine analysis:    Component Value Date/Time   COLORURINE YELLOW 11/21/2013 1008    APPEARANCEUR CLEAR 11/21/2013 1008   LABSPEC 1.019 11/21/2013 1008   PHURINE 6.5 11/21/2013 1008   GLUCOSEU NEG 11/21/2013 1008   HGBUR MOD (A) 11/21/2013 1008   BILIRUBINUR negative 01/21/2016 1040   KETONESUR negative 01/21/2016 1040   KETONESUR NEG 11/21/2013 1008   PROTEINUR =100 (A) 01/21/2016 1040   PROTEINUR 100 (A) 11/21/2013 1008   UROBILINOGEN 0.2 01/21/2016 1040   UROBILINOGEN 1 11/21/2013 1008   NITRITE Negative 01/21/2016 1040   NITRITE NEG 11/21/2013 1008   LEUKOCYTESUR Negative 01/21/2016 1040    Radiological Exams on Admission: DG Chest Portable 1 View  Result Date: 08/24/2020 CLINICAL DATA:  Weakness. EXAM: PORTABLE CHEST 1 VIEW COMPARISON:  July 07, 2017. FINDINGS: The heart size and mediastinal contours are within normal limits. Status post coronary bypass graft. Right internal jugular Port-A-Cath is noted with distal tip in expected position of cavoatrial junction. No pneumothorax or pleural effusion is noted. Both lungs are clear. The visualized skeletal structures are unremarkable. IMPRESSION: No active disease. Electronically Signed   By: Marijo Conception M.D.   On: 08/24/2020 11:58    Assessment/Plan AKI on [id number] Non-gap Metabolic acidosis Azotemia     - place in obs, med-surg     - fluids     - check renal US     - likely cause is high output ostomy  Rectal CA s/o robotic assisted lower anterior resection w/ diverting ileostomy     - noticed higher output from his ostomy lately     - no fevers, pus/blood in output     - white count is ok     - imodium, fiber, fluids  CAD s/p CABG     - ASA, statin, BB  HTN     - hold diovan d/t AKI; continue BB  HLD     - statin  Hypocalcemia     - add IV calcium, follow  Normocytic anemia Thrombocytopenia     - no evidence of bleed,  follow  DVT prophylaxis: heparin  Code Status: FULL  Family Communication: None at bedside  Consults called: None   Status is: Observation  The patient remains  OBS appropriate and will d/c before 2 midnights.  Dispo: The patient is from: Home              Anticipated d/c is to: Home              Patient currently is not medically stable to d/c.   Difficult to place patient No  Time spent coordinating admission: 75 minutes  [facility]  If 7PM-7AM, please contact night-coverage [email or web address]  08/24/2020, 1:38 PM

## 2020-08-24 NOTE — Progress Notes (Signed)
   08/24/20 1652  Notify: Provider  Provider Name/Title Dr Marylyn Ishihara  Date Provider Notified 08/24/20  Time Provider Notified 1652  Notification Type Page  Notification Reason Critical result (Mg 0.7)  Provider response See new orders  Date of Provider Response 08/24/20  Time of Provider Response 1653

## 2020-08-25 ENCOUNTER — Other Ambulatory Visit: Payer: Self-pay

## 2020-08-25 DIAGNOSIS — D630 Anemia in neoplastic disease: Secondary | ICD-10-CM | POA: Diagnosis present

## 2020-08-25 DIAGNOSIS — N179 Acute kidney failure, unspecified: Secondary | ICD-10-CM | POA: Diagnosis present

## 2020-08-25 DIAGNOSIS — Z20822 Contact with and (suspected) exposure to covid-19: Secondary | ICD-10-CM | POA: Diagnosis present

## 2020-08-25 DIAGNOSIS — E860 Dehydration: Secondary | ICD-10-CM | POA: Diagnosis present

## 2020-08-25 DIAGNOSIS — Z951 Presence of aortocoronary bypass graft: Secondary | ICD-10-CM | POA: Diagnosis not present

## 2020-08-25 DIAGNOSIS — Z7982 Long term (current) use of aspirin: Secondary | ICD-10-CM | POA: Diagnosis not present

## 2020-08-25 DIAGNOSIS — Z932 Ileostomy status: Secondary | ICD-10-CM | POA: Diagnosis not present

## 2020-08-25 DIAGNOSIS — R198 Other specified symptoms and signs involving the digestive system and abdomen: Secondary | ICD-10-CM | POA: Diagnosis not present

## 2020-08-25 DIAGNOSIS — Z79899 Other long term (current) drug therapy: Secondary | ICD-10-CM | POA: Diagnosis not present

## 2020-08-25 DIAGNOSIS — N1832 Chronic kidney disease, stage 3b: Secondary | ICD-10-CM | POA: Diagnosis present

## 2020-08-25 DIAGNOSIS — K219 Gastro-esophageal reflux disease without esophagitis: Secondary | ICD-10-CM | POA: Diagnosis present

## 2020-08-25 DIAGNOSIS — I129 Hypertensive chronic kidney disease with stage 1 through stage 4 chronic kidney disease, or unspecified chronic kidney disease: Secondary | ICD-10-CM | POA: Diagnosis present

## 2020-08-25 DIAGNOSIS — C20 Malignant neoplasm of rectum: Secondary | ICD-10-CM | POA: Diagnosis present

## 2020-08-25 DIAGNOSIS — E872 Acidosis: Secondary | ICD-10-CM | POA: Diagnosis present

## 2020-08-25 DIAGNOSIS — I2510 Atherosclerotic heart disease of native coronary artery without angina pectoris: Secondary | ICD-10-CM | POA: Diagnosis present

## 2020-08-25 DIAGNOSIS — E785 Hyperlipidemia, unspecified: Secondary | ICD-10-CM | POA: Diagnosis present

## 2020-08-25 DIAGNOSIS — D696 Thrombocytopenia, unspecified: Secondary | ICD-10-CM | POA: Diagnosis present

## 2020-08-25 DIAGNOSIS — R7303 Prediabetes: Secondary | ICD-10-CM | POA: Diagnosis present

## 2020-08-25 LAB — COMPREHENSIVE METABOLIC PANEL
ALT: 15 U/L (ref 0–44)
AST: 19 U/L (ref 15–41)
Albumin: 3.5 g/dL (ref 3.5–5.0)
Alkaline Phosphatase: 71 U/L (ref 38–126)
Anion gap: 8 (ref 5–15)
BUN: 62 mg/dL — ABNORMAL HIGH (ref 8–23)
CO2: 17 mmol/L — ABNORMAL LOW (ref 22–32)
Calcium: 7.8 mg/dL — ABNORMAL LOW (ref 8.9–10.3)
Chloride: 114 mmol/L — ABNORMAL HIGH (ref 98–111)
Creatinine, Ser: 2.08 mg/dL — ABNORMAL HIGH (ref 0.61–1.24)
GFR, Estimated: 33 mL/min — ABNORMAL LOW (ref >60)
Glucose, Bld: 144 mg/dL — ABNORMAL HIGH (ref 70–99)
Potassium: 4 mmol/L (ref 3.5–5.1)
Sodium: 139 mmol/L (ref 135–145)
Total Bilirubin: 0.7 mg/dL (ref 0.3–1.2)
Total Protein: 6.6 g/dL (ref 6.5–8.1)

## 2020-08-25 LAB — CBC
HCT: 33.4 % — ABNORMAL LOW (ref 39.0–52.0)
Hemoglobin: 11.2 g/dL — ABNORMAL LOW (ref 13.0–17.0)
MCH: 31.2 pg (ref 26.0–34.0)
MCHC: 33.5 g/dL (ref 30.0–36.0)
MCV: 93 fL (ref 80.0–100.0)
Platelets: 128 10*3/uL — ABNORMAL LOW (ref 150–400)
RBC: 3.59 MIL/uL — ABNORMAL LOW (ref 4.22–5.81)
RDW: 14 % (ref 11.5–15.5)
WBC: 6.9 10*3/uL (ref 4.0–10.5)
nRBC: 0 % (ref 0.0–0.2)

## 2020-08-25 LAB — URINALYSIS, ROUTINE W REFLEX MICROSCOPIC
Bilirubin Urine: NEGATIVE
Glucose, UA: NEGATIVE mg/dL
Hgb urine dipstick: NEGATIVE
Ketones, ur: NEGATIVE mg/dL
Leukocytes,Ua: NEGATIVE
Nitrite: NEGATIVE
Protein, ur: NEGATIVE mg/dL
Specific Gravity, Urine: 1.01 (ref 1.005–1.030)
pH: 5 (ref 5.0–8.0)

## 2020-08-25 LAB — MAGNESIUM: Magnesium: 1.9 mg/dL (ref 1.7–2.4)

## 2020-08-25 MED ORDER — CALCIUM GLUCONATE-NACL 2-0.675 GM/100ML-% IV SOLN
2.0000 g | Freq: Once | INTRAVENOUS | Status: AC
  Administered 2020-08-25: 2000 mg via INTRAVENOUS
  Filled 2020-08-25: qty 100

## 2020-08-25 MED ORDER — CHLORHEXIDINE GLUCONATE CLOTH 2 % EX PADS
6.0000 | MEDICATED_PAD | Freq: Every day | CUTANEOUS | Status: DC
  Administered 2020-08-25 – 2020-08-26 (×2): 6 via TOPICAL

## 2020-08-25 MED ORDER — ACETAMINOPHEN 325 MG PO TABS
650.0000 mg | ORAL_TABLET | Freq: Four times a day (QID) | ORAL | Status: DC | PRN
  Administered 2020-08-25 – 2020-08-26 (×2): 650 mg via ORAL
  Filled 2020-08-25 (×2): qty 2

## 2020-08-25 NOTE — Consult Note (Signed)
[place] Nurse ostomy consult note Pt is familiar to [facility] team from previous admission. He had ileostomy surgery performed in Jan, 2022.  He states he is independent with pouch application and emptying piror to admission and denies further questions or need for assistance.  He did not bring any supplies from home.  He is currently using a 2 1/4 inch- 2 piece pouching system with a barrier ring and a belt and a clamp, but states he is familiar with the velcro style to open and close to empty.  He states he gets at least 4 days of wear time prior to admission.  Stoma type/location: Stoma is red and viable, above skin level, when visualized through the pouch, which is intact with good seal.  Output: mod amt liquid brown stool in the pouch 2 sets of extra supplies left in the room for patient use: Use supplies: barrier ring, Kellie Simmering # [phone], wafer Kellie Simmering # [phone], pouch Lawson # [phone] Please re-consult if further assistance is needed.  Thank-you,  Julien Girt MSN, [place], [place], [place], [place]

## 2020-08-25 NOTE — Progress Notes (Signed)
PROGRESS NOTE    Jared White  GUR:[id number] DOB: Nov 11, 1945 DOA: 08/24/2020 PCP: [name], MD    Brief Narrative:  Jared White is a 75 year old male with past medical history significant for rectal cancer s/p ostomy, essential hypertension, hyperlipidemia who presented to the ED with cramps in his hands and legs over the past day.  Also associated with generalized weakness and dry mouth.  Patient reported noticing increase in his ostomy output and tried drinking more fluids but did not help.  In the ED, temperature 97.5 F, HR 69, RR 18, BP 136/80, SPO2 100% on room air.  Sodium 134, potassium 4.8, chloride 105, CO2 17, glucose 119, BUN 93, creatinine 3.51, calcium 7.4, anion gap 12, lipase 62.  WBC 6.7, hemoglobin 12.9, platelets 147.  Magnesium 0.7.  SARS-CoV-2/Covid-19 PCR negative.  Influenza A/B PCR negative.  Chest x-ray negative for acute cardiopulmonary disease process.  Patient was given IV fluids in the ED.  Hospitalist service consulted for further evaluation and management of significant electrolyte disturbance, acute renal failure and dehydration due to high output ostomy.   Assessment & Plan:   Active Problems:   AKI (acute kidney injury) ([place])   Acute renal failure on CKD stage IIIa Patient presenting with increased weakness, hand/lower extremity cramping pain in the setting of increased ostomy output, despite attempts to increase oral intake at home.  Patient's creatinine on arrival was elevated at 3.51 with a baseline of 1.2-1.3. --Cr 3.51>2.08 --Continue IV fluid hydration with NS at 150 mL's per hour --FiberCon 625 mg p.o. daily --Imodium 2 mg p.o. QID --Holding home Diovan --Avoid nephrotoxins, renal dose all medications --Follow BMP daily  Hypocalcemia Calcium 7.4 on admission.  Patient complaining of cramping to hands and lower extremities.  Received calcium gluconate 2g IV.  Repeat calcium up to 7.8 this morning, will repeat IV calcium today. --BMP in  a.m.  Hypomagnesemia Magnesium 0.7 on admission, etiology likely secondary to high ostomy output.  Repleted aggressively with IV magnesium. --Continue magnesium oxide 250 mg p.o. daily --Magnesium level in a.m.  History of rectal cancer s/p robotic assisted low anterior resection with diverting ileostomy --Strict I's and O's --FiberCon and Imodium to control high output --Wound care/ostomy consult  CAD s/p CABG --Aspirin 325 mg p.o. daily --Crestor 20 mg p.o. daily --Atenolol 50 mg p.o. daily  Essential hypertension --Atenolol 50 mg p.o. daily --Holding Diovan secondary to acute renal failure as above  Hyperlipidemia: Crestor 20 mg p.o. daily  Normocytic anemia Thrombocytopenia --Stable --CBC daily   DVT prophylaxis: Heparin   Code Status: Full Code Family Communication: No family present at bedside  Disposition Plan:  Level of care: Med-Surg Status is: Observation  The patient remains OBS appropriate and will d/c before 2 midnights.  Dispo: The patient is from: Home              Anticipated d/c is to: Home              Patient currently is not medically stable to d/c.   Difficult to place patient No   Consultants:   None  Procedures:   None  Antimicrobials:   None   Subjective: Patient seen and examined bedside, resting comfortably.  States cramping to hands and legs improved.  Reports ostomy output now decreasing.  No other questions or concerns at this time.  Denies headache, no fever/chills/night sweats, no nausea/vomiting/diarrhea, no chest pain, palpitations, shortness of breath, no abdominal pain, no weakness, no fatigue, no paresthesias.  No acute events overnight per nursing.  Objective: Vitals:   08/24/20 1841 08/24/20 2320 08/25/20 0229 08/25/20 0603  BP: 115/60 116/60 (!) 116/59 108/61  Pulse: (!) 56 (!) 57 97 (!) 58  Resp: 18 16 16 17   Temp: 97.7 F (36.5 C) 97.9 F (36.6 C) (!) 97.4 F (36.3 C) 98 F (36.7 C)  TempSrc: Oral Oral  Oral Oral  SpO2: 100% 100% 100% 100%  Weight:      Height:        Intake/Output Summary (Last 24 hours) at 08/25/2020 1121 Last data filed at 08/24/2020 1733 Gross per 24 hour  Intake 872.82 ml  Output 1 ml  Net 871.82 ml   Filed Weights   08/24/20 1117  Weight: 61.7 kg    Examination:  General exam: Appears calm and comfortable  Respiratory system: Clear to auscultation. Respiratory effort normal.  On room air Cardiovascular system: S1 & S2 heard, RRR. No JVD, murmurs, rubs, gallops or clicks. No pedal edema. Gastrointestinal system: Abdomen is nondistended, soft and nontender. No organomegaly or masses felt. Normal bowel sounds heard.  Ostomy noted with liquid brown stool Central nervous system: Alert and oriented. No focal neurological deficits. Extremities: Symmetric 5 x 5 power. Skin: No rashes, lesions or ulcers Psychiatry: Judgement and insight appear normal. Mood & affect appropriate.     Data Reviewed: I have personally reviewed following labs and imaging studies  CBC: Recent Labs  Lab 08/24/20 1204 08/25/20 0526  WBC 6.7 6.9  NEUTROABS 5.1  --   HGB 12.9* 11.2*  HCT 38.7* 33.4*  MCV 93.7 93.0  PLT 143* 828*   Basic Metabolic Panel: Recent Labs  Lab 08/24/20 1204 08/24/20 1405 08/24/20 2214 08/25/20 0526  NA 134*  --   --  139  K 4.8  --   --  4.0  CL 105  --   --  114*  CO2 17*  --   --  17*  GLUCOSE 119*  --   --  144*  BUN 93*  --   --  62*  CREATININE 3.51*  --   --  2.08*  CALCIUM 7.4*  --   --  7.8*  MG  --  0.7* 1.0* 1.9   GFR: Estimated Creatinine Clearance: 27.2 mL/min (A) (by C-G formula based on SCr of 2.08 mg/dL (H)). Liver Function Tests: Recent Labs  Lab 08/24/20 1204 08/25/20 0526  AST 18 19  ALT 17 15  ALKPHOS 81 71  BILITOT 0.9 0.7  PROT 7.7 6.6  ALBUMIN 4.2 3.5   Recent Labs  Lab 08/24/20 1204  LIPASE 62*   No results for input(s): AMMONIA in the last 168 hours. Coagulation Profile: No results for input(s): INR,  PROTIME in the last 168 hours. Cardiac Enzymes: No results for input(s): CKTOTAL, CKMB, CKMBINDEX, TROPONINI in the last 168 hours. BNP (last 3 results) No results for input(s): PROBNP in the last 8760 hours. HbA1C: No results for input(s): HGBA1C in the last 72 hours. CBG: No results for input(s): GLUCAP in the last 168 hours. Lipid Profile: No results for input(s): CHOL, HDL, LDLCALC, TRIG, CHOLHDL, LDLDIRECT in the last 72 hours. Thyroid Function Tests: No results for input(s): TSH, T4TOTAL, FREET4, T3FREE, THYROIDAB in the last 72 hours. Anemia Panel: No results for input(s): VITAMINB12, FOLATE, FERRITIN, TIBC, IRON, RETICCTPCT in the last 72 hours. Sepsis Labs: No results for input(s): PROCALCITON, LATICACIDVEN in the last 168 hours.  Recent Results (from the past 240 hour(s))  Resp Panel  by RT-PCR (Flu A&B, Covid) Nasopharyngeal Swab     Status: None   Collection Time: 08/24/20  1:10 PM   Specimen: Nasopharyngeal Swab; Nasopharyngeal(NP) swabs in vial transport medium  Result Value Ref Range Status   SARS Coronavirus 2 by RT PCR NEGATIVE NEGATIVE Final    Comment: (NOTE) SARS-CoV-2 target nucleic acids are NOT DETECTED.  The SARS-CoV-2 RNA is generally detectable in upper respiratory specimens during the acute phase of infection. The lowest concentration of SARS-CoV-2 viral copies this assay can detect is 138 copies/mL. A negative result does not preclude SARS-Cov-2 infection and should not be used as the sole basis for treatment or other patient management decisions. A negative result may occur with  improper specimen collection/handling, submission of specimen other than nasopharyngeal swab, presence of viral mutation(s) within the areas targeted by this assay, and inadequate number of viral copies(<138 copies/mL). A negative result must be combined with clinical observations, patient history, and epidemiological information. The expected result is Negative.  Fact  Sheet for Patients:  EntrepreneurPulse.com.au  Fact Sheet for Healthcare Providers:  IncredibleEmployment.be  This test is no t yet approved or cleared by the [place] FDA and  has been authorized for detection and/or diagnosis of SARS-CoV-2 by FDA under an Emergency Use Authorization (EUA). This EUA will remain  in effect (meaning this test can be used) for the duration of the COVID-19 declaration under Section 564(b)(1) of the Act, 21 U.S.C.section 360bbb-3(b)(1), unless the authorization is terminated  or revoked sooner.       Influenza A by PCR NEGATIVE NEGATIVE Final   Influenza B by PCR NEGATIVE NEGATIVE Final    Comment: (NOTE) The Xpert Xpress SARS-CoV-2/FLU/RSV plus assay is intended as an aid in the diagnosis of influenza from Nasopharyngeal swab specimens and should not be used as a sole basis for treatment. Nasal washings and aspirates are unacceptable for Xpert Xpress SARS-CoV-2/FLU/RSV testing.  Fact Sheet for Patients: EntrepreneurPulse.com.au  Fact Sheet for Healthcare Providers: IncredibleEmployment.be  This test is not yet approved or cleared by the [place] FDA and has been authorized for detection and/or diagnosis of SARS-CoV-2 by FDA under an Emergency Use Authorization (EUA). This EUA will remain in effect (meaning this test can be used) for the duration of the COVID-19 declaration under Section 564(b)(1) of the Act, 21 U.S.C. section 360bbb-3(b)(1), unless the authorization is terminated or revoked.  Performed at [facility], [place] [address], [place], [place] [address]          Radiology Studies: US RENAL  Result Date: 08/24/2020 CLINICAL DATA:  Acute kidney injury EXAM: RENAL / URINARY TRACT ULTRASOUND COMPLETE COMPARISON:  None. FINDINGS: Right Kidney: Renal measurements: 9.9 x 4.7 x 6.0 cm = volume: 143 mL. Echogenicity within normal limits.  Shadowing 5 mm calculi seen within the midpole. There is also a anechoic cyst seen within the lower pole measuring 1.3 x 1.1 x 1.2 cm. No hydronephrosis is noted. Left Kidney: Renal measurements: 10.0 x 5.7 x 5.6 cm = volume: 165 mL. Echogenicity within normal limits. There is a anechoic cystic lesion seen off the lower pole of the left kidney measuring 4.1 x 3.3 x 3.5 cm. Bladder: Appears normal for degree of bladder distention. Other: Heterogeneously enlarged prostate gland measuring 4.3 x 3.4 x 4.2 cm IMPRESSION: Nonobstructing 5 mm right renal calculus. Bilateral renal cysts Prostatomegaly Electronically Signed   By: Prudencio Pair M.D.   On: 08/24/2020 19:52   DG Chest Portable 1 View  Result Date:  08/24/2020 CLINICAL DATA:  Weakness. EXAM: PORTABLE CHEST 1 VIEW COMPARISON:  July 07, 2017. FINDINGS: The heart size and mediastinal contours are within normal limits. Status post coronary bypass graft. Right internal jugular Port-A-Cath is noted with distal tip in expected position of cavoatrial junction. No pneumothorax or pleural effusion is noted. Both lungs are clear. The visualized skeletal structures are unremarkable. IMPRESSION: No active disease. Electronically Signed   By: Marijo Conception M.D.   On: 08/24/2020 11:58        Scheduled Meds: . ascorbic acid  500 mg Oral Daily  . aspirin  325 mg Oral Daily  . atenolol  50 mg Oral Daily  . calcium-vitamin D  1 tablet Oral Daily  . Chlorhexidine Gluconate Cloth  6 each Topical Daily  . heparin  5,000 Units Subcutaneous Q8H  . loperamide  2 mg Oral QID  . magnesium oxide  200 mg Oral Daily  . polycarbophil  625 mg Oral Daily  . rosuvastatin  20 mg Oral Daily  . vitamin B-12  50 mcg Oral Daily  . vitamin E  100 Units Oral Daily   Continuous Infusions: . sodium chloride 150 mL/hr at 08/25/20 0015     LOS: 0 days    Time spent: 39 minutes spent on chart review, discussion with nursing staff, consultants, updating family and  interview/physical exam; more than 50% of that time was spent in counseling and/or coordination of care.    Brazen Domangue J [place] DO [facility] Available via Epic secure chat 7am-7pm After these hours, please refer to coverage provider listed on [email or web address] 08/25/2020, 11:21 AM

## 2020-08-25 NOTE — Progress Notes (Signed)
Patient educated on fall risk safety and protocol. Explained with his diagnosis of dehydration, that he needs to take extra precautions getting up for the restroom d/t the potential for dizziness or weakness. Explained that he meets Fall risk measures and use of the bed alarm for safety with IV pole, etc. Despite education, patient refusing bed alarm.

## 2020-08-26 LAB — BASIC METABOLIC PANEL
Anion gap: 6 (ref 5–15)
BUN: 37 mg/dL — ABNORMAL HIGH (ref 8–23)
CO2: 17 mmol/L — ABNORMAL LOW (ref 22–32)
Calcium: 7.9 mg/dL — ABNORMAL LOW (ref 8.9–10.3)
Chloride: 119 mmol/L — ABNORMAL HIGH (ref 98–111)
Creatinine, Ser: 1.45 mg/dL — ABNORMAL HIGH (ref 0.61–1.24)
GFR, Estimated: 51 mL/min — ABNORMAL LOW (ref >60)
Glucose, Bld: 99 mg/dL (ref 70–99)
Potassium: 4 mmol/L (ref 3.5–5.1)
Sodium: 142 mmol/L (ref 135–145)

## 2020-08-26 LAB — MAGNESIUM: Magnesium: 1.6 mg/dL — ABNORMAL LOW (ref 1.7–2.4)

## 2020-08-26 MED ORDER — CALCIUM POLYCARBOPHIL 625 MG PO TABS: 625.0000 mg | ORAL_TABLET | Freq: Every day | ORAL | 2 refills | Status: DC

## 2020-08-26 MED ORDER — HEPARIN SOD (PORK) LOCK FLUSH 100 UNIT/ML IV SOLN
500.0000 [IU] | INTRAVENOUS | Status: DC | PRN
  Filled 2020-08-26: qty 5

## 2020-08-26 MED ORDER — MAGNESIUM SULFATE 2 GM/50ML IV SOLN
2.0000 g | Freq: Once | INTRAVENOUS | Status: AC
  Administered 2020-08-26: 2 g via INTRAVENOUS
  Filled 2020-08-26: qty 50

## 2020-08-26 NOTE — Discharge Summary (Addendum)
Physician Discharge Summary  Jared White IRC:[id number] DOB: 1945/06/24 DOA: 08/24/2020  PCP: [name], MD  Admit date: 08/24/2020 Discharge date: 08/26/2020  Admitted From: Home Discharge disposition: Home   Code Status: Full Code  Diet Recommendation: Regular diet  Discharge Diagnosis:   Principal Problem:   AKI (acute kidney injury) ([place]) Active Problems:   CAD (coronary artery disease)   Essential hypertension   Hyperlipidemia   S/P CABG x 4   Hypomagnesemia   Stage 3b chronic kidney disease ([place])   Rectal cancer ([place])  History of Present Illness / Brief narrative:  Jared White is a 75 year old male with past medical history significant for rectal cancer s/p ostomy, essential hypertension, hyperlipidemia Patient presented to the ED on 4/4 with complaint of cramps in his hands and legs over 24 hours associated with generalized weakness, dry mouth. Patient also reported increase in his ostomy output for which he was trying to keep up with oral hydration but not able to. In the ED, labs showed creatinine elevated to 3.51, serum bicarb low at 17, calcium 7.4, anion gap 12, lipase 62.   Patient was admitted to hospitalist service for hydration and electrolyte management.  Subjective:  Seen and examined this morning.  Pleasant elderly Caucasian male.  Walking around in the room.  Not in distress.  Feels much better.  Hospital Course:  High output from ostomy -Improved with FiberCon and Imodium. -Output improving. -Patient has been getting IV fluid boluses twice weekly at home.  Continue the same.  Acute renal failure on CKD stage IIIa -Secondary to high-output ostomy.   -Renal function significantly improving with IV fluid.  Continue to maintain oral hydration at home. Recent Labs    06/03/20 0907 06/11/20 0529 06/12/20 0508 06/13/20 0534 06/14/20 0516 06/15/20 0508 07/31/20 1400 08/24/20 1204 08/25/20 0526 08/26/20 0454  BUN 28* 22 20 15 15 16 19  93* 62*  37*  CREATININE 1.24 1.48* 1.51* 1.11 1.24 1.29* 1.33* 3.51* 2.08* 1.45*   Hypocalcemia -Received calcium gluconate IV with improvement.  Continue calcium supplement at home. Recent Labs  Lab 08/24/20 1204 08/25/20 0526 08/26/20 0454  CALCIUM 7.4* 7.8* 7.9*   Hypomagnesemia -Calcium was low at 0.7 on admission.  Improving with IV replacement.  Continue oral magnesium replacement. Recent Labs  Lab 08/24/20 1204 08/24/20 1405 08/24/20 2214 08/25/20 0526 08/26/20 0454  K 4.8  --   --  4.0 4.0  MG  --  0.7* 1.0* 1.9 1.6*   Essential hypertension -Continue atenolol 50 mg daily.  Diovan on hold currently.  Can resume post discharge at home.  History of rectal cancer s/p robotic assisted low anterior resection with diverting ileostomy  CAD s/p CABG Hyperlipidemia -Aspirin 325 mg p.o. daily -Crestor 20 mg p.o. daily -Atenolol 50 mg p.o. daily  Normocytic anemia Thrombocytopenia -CBC stable Recent Labs  Lab 08/24/20 1204 08/25/20 0526  WBC 6.7 6.9  NEUTROABS 5.1  --   HGB 12.9* 11.2*  HCT 38.7* 33.4*  MCV 93.7 93.0  PLT 143* 128*     Wound care: Incision - 3 Ports Abdomen Right;Lower;Lateral Mid;Upper Left;Medial;Upper (Active)  Placement Date/Time: 06/10/20 0915   Location of Ports: Abdomen  Location Orientation: Right;Lower;Lateral  Location Orientation: Mid;Upper  Location Orientation: Left;Medial;Upper    Assessments 06/10/2020 12:10 PM 06/16/2020  8:08 AM  Port 1 Site Assessment -- [employer] 1 Margins -- Attached edges (approximated)  Port 1 Drainage Amount -- None  Port 1 Dressing Type Liquid skin adhesive  Liquid skin adhesive  Port 1 Dressing Status -- Clean;Intact;Dry  Port 2 Site Assessment -- [employer] 2 Margins -- Attached edges (approximated)  Port 2 Drainage Amount -- None  Port 2 Dressing Type Liquid skin adhesive Liquid skin adhesive  Port 2 Dressing Status -- Dry;Clean;Intact  Port 3 Site Assessment -- Dry;Clean  Port 3 Margins  -- Attached edges (approximated)  Port 3 Drainage Amount -- None  Port 3 Dressing Type Liquid skin adhesive Liquid skin adhesive  Port 3 Dressing Status -- Clean;Dry;Intact     No Linked orders to display     Incision (Closed) 06/10/20 Abdomen Other (Comment) (Active)  Date First Assessed/Time First Assessed: 06/10/20 1209   Location: Abdomen  Location Orientation: Other (Comment)    Assessments 06/10/2020 12:41 PM 06/16/2020  8:08 AM  Dressing Type Liquid skin adhesive Liquid skin adhesive  Dressing Clean;Dry;Intact Clean;Dry;Intact  Site / Wound Assessment -- Clean;Dry  Margins -- Attached edges (approximated)  Closure -- None  Drainage Amount Moderate None  Drainage Description Serosanguineous --     No Linked orders to display    Discharge Exam:   Vitals:   08/25/20 0603 08/25/20 1453 08/25/20 1957 08/26/20 0449  BP: 108/61 120/65 (!) 143/74 132/70  Pulse: (!) 58 (!) 58 64 (!) 58  Resp: 17  16 16   Temp: 98 F (36.7 C) 98 F (36.7 C) 97.8 F (36.6 C) 97.8 F (36.6 C)  TempSrc: Oral Oral Oral Oral  SpO2: 100% 100% 100% 99%  Weight:      Height:        Body mass index is 20.68 kg/m.  General exam: Pleasant, elderly Caucasian male.  Not in distress. Skin: No rashes, lesions or ulcers. HEENT: Atraumatic, normocephalic, no obvious bleeding Lungs: Clear to auscultation bilaterally CVS: Regular rate and rhythm, no murmur GI/Abd soft, nontender, nondistended, bowel sound present, ostomy intact CNS: Alert, awake, oriented x3 Psychiatry: Mood appropriate Extremities: No pedal edema, no calf tenderness  Follow ups:   Discharge Instructions    Diet - low sodium heart healthy   Complete by: As directed    Increase activity slowly   Complete by: As directed       Follow-up Information    [name], MD Follow up.   Specialties: Family Medicine, Sports Medicine Contact information: [address] [place] [address] [phone]                Recommendations for Outpatient Follow-Up:   1. Follow-up with PCP as an outpatient  Discharge Instructions:  Follow with Primary MD [name], MD in 7 days   Get CBC/BMP checked in next visit within 1 week by PCP or SNF MD ( we routinely change or add medications that can affect your baseline labs and fluid status, therefore we recommend that you get the mentioned basic workup next visit with your PCP, your PCP may decide not to get them or add new tests based on their clinical decision)  On your next visit with your PCP, please Get Medicines reviewed and adjusted.  Please request your PCP  to go over all Hospital Tests and Procedure/Radiological results at the follow up, please get all Hospital records sent to your Prim MD by signing hospital release before you go home.  Activity: As tolerated with Full fall precautions use walker/cane & assistance as needed  For Heart failure patients - Check your Weight same time everyday, if you gain over 2 pounds, or you develop in leg  swelling, experience more shortness of breath or chest pain, call your Primary MD immediately. Follow Cardiac Low Salt Diet and 1.5 lit/day fluid restriction.  If you have smoked or chewed Tobacco in the last 2 yrs please stop smoking, stop any regular Alcohol  and or any Recreational drug use.  If you experience worsening of your admission symptoms, develop shortness of breath, life threatening emergency, suicidal or homicidal thoughts you must seek medical attention immediately by calling 911 or calling your MD immediately  if symptoms less severe.  You Must read complete instructions/literature along with all the possible adverse reactions/side effects for all the Medicines you take and that have been prescribed to you. Take any new Medicines after you have completely understood and accpet all the possible adverse reactions/side effects.   Do not drive, operate heavy machinery, perform activities at  heights, swimming or participation in water activities or provide baby sitting services if your were admitted for syncope or siezures until you have seen by Primary MD or a Neurologist and advised to do so again.  Do not drive when taking Pain medications.  Do not take more than prescribed Pain, Sleep and Anxiety Medications  Wear Seat belts while driving.   Please note You were cared for by a hospitalist during your hospital stay. If you have any questions about your discharge medications or the care you received while you were in the hospital after you are discharged, you can call the unit and asked to speak with the hospitalist on call if the hospitalist that took care of you is not available. Once you are discharged, your primary care physician will handle any further medical issues. Please note that NO REFILLS for any discharge medications will be authorized once you are discharged, as it is imperative that you return to your primary care physician (or establish a relationship with a primary care physician if you do not have one) for your aftercare needs so that they can reassess your need for medications and monitor your lab values.    Allergies as of 08/26/2020   No Known Allergies     Medication List    TAKE these medications   ascorbic acid 500 MG tablet Commonly known as: VITAMIN C Take 500 mg by mouth daily.   aspirin 325 MG tablet Take 325 mg by mouth daily. What changed: Another medication with the same name was removed. Continue taking this medication, and follow the directions you see here.   atenolol 50 MG tablet Commonly known as: TENORMIN TAKE 1 TABLET BY MOUTH EVERY DAY   Calcium 600-200 MG-UNIT tablet Take 1 tablet by mouth daily.   loperamide 2 MG capsule Commonly known as: IMODIUM TAKE 1 CAPSULE BY MOUTH 4 TIMES DAILY BEFORE MEALS AND AT BEDTIME What changed:   how much to take  how to take this  when to take this   Magnesium 250 MG Tabs Take 250 mg  by mouth daily.   nitroGLYCERIN 0.4 MG SL tablet Commonly known as: NITROSTAT Place 0.4 mg under the tongue every 5 (five) minutes x 3 doses as needed for chest pain.   omeprazole 40 MG capsule Commonly known as: PRILOSEC TAKE 1 CAPSULE BY MOUTH TWICE A DAY What changed: when to take this   polycarbophil 625 MG tablet Commonly known as: FIBERCON Take 1 tablet (625 mg total) by mouth daily. Start taking on: August 27, 2020 What changed: when to take this   rosuvastatin 20 MG tablet Commonly known as: CRESTOR TAKE  1 TABLET BY MOUTH EVERY DAY   sodium chloride 0.9 % Inject 1,000 mLs into the vein every 3 (three) days.   traMADol 50 MG tablet Commonly known as: ULTRAM Take 50 mg by mouth every 6 (six) hours as needed for pain.   valsartan 160 MG tablet Commonly known as: DIOVAN Take 160 mg by mouth daily.   vitamin B-12 50 MCG tablet Commonly known as: CYANOCOBALAMIN Take 50 mcg by mouth daily.   vitamin E 45 MG (100 UNITS) capsule Take 100 Units by mouth daily.       Time coordinating discharge: 35 minutes  The results of significant diagnostics from this hospitalization (including imaging, microbiology, ancillary and laboratory) are listed below for reference.    Procedures and Diagnostic Studies:   US RENAL  Result Date: 08/24/2020 CLINICAL DATA:  Acute kidney injury EXAM: RENAL / URINARY TRACT ULTRASOUND COMPLETE COMPARISON:  None. FINDINGS: Right Kidney: Renal measurements: 9.9 x 4.7 x 6.0 cm = volume: 143 mL. Echogenicity within normal limits. Shadowing 5 mm calculi seen within the midpole. There is also a anechoic cyst seen within the lower pole measuring 1.3 x 1.1 x 1.2 cm. No hydronephrosis is noted. Left Kidney: Renal measurements: 10.0 x 5.7 x 5.6 cm = volume: 165 mL. Echogenicity within normal limits. There is a anechoic cystic lesion seen off the lower pole of the left kidney measuring 4.1 x 3.3 x 3.5 cm. Bladder: Appears normal for degree of bladder  distention. Other: Heterogeneously enlarged prostate gland measuring 4.3 x 3.4 x 4.2 cm IMPRESSION: Nonobstructing 5 mm right renal calculus. Bilateral renal cysts Prostatomegaly Electronically Signed   By: Prudencio Pair M.D.   On: 08/24/2020 19:52   DG Chest Portable 1 View  Result Date: 08/24/2020 CLINICAL DATA:  Weakness. EXAM: PORTABLE CHEST 1 VIEW COMPARISON:  July 07, 2017. FINDINGS: The heart size and mediastinal contours are within normal limits. Status post coronary bypass graft. Right internal jugular Port-A-Cath is noted with distal tip in expected position of cavoatrial junction. No pneumothorax or pleural effusion is noted. Both lungs are clear. The visualized skeletal structures are unremarkable. IMPRESSION: No active disease. Electronically Signed   By: Marijo Conception M.D.   On: 08/24/2020 11:58     Labs:   Basic Metabolic Panel: Recent Labs  Lab 08/24/20 1204 08/24/20 1405 08/24/20 2214 08/25/20 0526 08/26/20 0454  NA 134*  --   --  139 142  K 4.8  --   --  4.0 4.0  CL 105  --   --  114* 119*  CO2 17*  --   --  17* 17*  GLUCOSE 119*  --   --  144* 99  BUN 93*  --   --  62* 37*  CREATININE 3.51*  --   --  2.08* 1.45*  CALCIUM 7.4*  --   --  7.8* 7.9*  MG  --  0.7* 1.0* 1.9 1.6*   GFR Estimated Creatinine Clearance: 39 mL/min (A) (by C-G formula based on SCr of 1.45 mg/dL (H)). Liver Function Tests: Recent Labs  Lab 08/24/20 1204 08/25/20 0526  AST 18 19  ALT 17 15  ALKPHOS 81 71  BILITOT 0.9 0.7  PROT 7.7 6.6  ALBUMIN 4.2 3.5   Recent Labs  Lab 08/24/20 1204  LIPASE 62*   No results for input(s): AMMONIA in the last 168 hours. Coagulation profile No results for input(s): INR, PROTIME in the last 168 hours.  CBC: Recent Labs  Lab 08/24/20  1204 08/25/20 0526  WBC 6.7 6.9  NEUTROABS 5.1  --   HGB 12.9* 11.2*  HCT 38.7* 33.4*  MCV 93.7 93.0  PLT 143* 128*   Cardiac Enzymes: No results for input(s): CKTOTAL, CKMB, CKMBINDEX, TROPONINI in  the last 168 hours. BNP: Invalid input(s): POCBNP CBG: No results for input(s): GLUCAP in the last 168 hours. D-Dimer No results for input(s): DDIMER in the last 72 hours. Hgb A1c No results for input(s): HGBA1C in the last 72 hours. Lipid Profile No results for input(s): CHOL, HDL, LDLCALC, TRIG, CHOLHDL, LDLDIRECT in the last 72 hours. Thyroid function studies No results for input(s): TSH, T4TOTAL, T3FREE, THYROIDAB in the last 72 hours.  Invalid input(s): FREET3 Anemia work up No results for input(s): VITAMINB12, FOLATE, FERRITIN, TIBC, IRON, RETICCTPCT in the last 72 hours. Microbiology Recent Results (from the past 240 hour(s))  Resp Panel by RT-PCR (Flu A&B, Covid) Nasopharyngeal Swab     Status: None   Collection Time: 08/24/20  1:10 PM   Specimen: Nasopharyngeal Swab; Nasopharyngeal(NP) swabs in vial transport medium  Result Value Ref Range Status   SARS Coronavirus 2 by RT PCR NEGATIVE NEGATIVE Final    Comment: (NOTE) SARS-CoV-2 target nucleic acids are NOT DETECTED.  The SARS-CoV-2 RNA is generally detectable in upper respiratory specimens during the acute phase of infection. The lowest concentration of SARS-CoV-2 viral copies this assay can detect is 138 copies/mL. A negative result does not preclude SARS-Cov-2 infection and should not be used as the sole basis for treatment or other patient management decisions. A negative result may occur with  improper specimen collection/handling, submission of specimen other than nasopharyngeal swab, presence of viral mutation(s) within the areas targeted by this assay, and inadequate number of viral copies(<138 copies/mL). A negative result must be combined with clinical observations, patient history, and epidemiological information. The expected result is Negative.  Fact Sheet for Patients:  EntrepreneurPulse.com.au  Fact Sheet for Healthcare Providers:  IncredibleEmployment.be  This  test is no t yet approved or cleared by the [place] FDA and  has been authorized for detection and/or diagnosis of SARS-CoV-2 by FDA under an Emergency Use Authorization (EUA). This EUA will remain  in effect (meaning this test can be used) for the duration of the COVID-19 declaration under Section 564(b)(1) of the Act, 21 U.S.C.section 360bbb-3(b)(1), unless the authorization is terminated  or revoked sooner.       Influenza A by PCR NEGATIVE NEGATIVE Final   Influenza B by PCR NEGATIVE NEGATIVE Final    Comment: (NOTE) The Xpert Xpress SARS-CoV-2/FLU/RSV plus assay is intended as an aid in the diagnosis of influenza from Nasopharyngeal swab specimens and should not be used as a sole basis for treatment. Nasal washings and aspirates are unacceptable for Xpert Xpress SARS-CoV-2/FLU/RSV testing.  Fact Sheet for Patients: EntrepreneurPulse.com.au  Fact Sheet for Healthcare Providers: IncredibleEmployment.be  This test is not yet approved or cleared by the [place] FDA and has been authorized for detection and/or diagnosis of SARS-CoV-2 by FDA under an Emergency Use Authorization (EUA). This EUA will remain in effect (meaning this test can be used) for the duration of the COVID-19 declaration under Section 564(b)(1) of the Act, 21 U.S.C. section 360bbb-3(b)(1), unless the authorization is terminated or revoked.  Performed at [facility], [place] [address], [place], [place] [address]      Signed: Marlowe Aschoff Kadia Abaya  [facility] 08/26/2020, 1:27 PM

## 2020-08-26 NOTE — Discharge Instructions (Signed)
Acute Kidney Injury, Adult  Acute kidney injury is a sudden worsening of kidney function. The kidneys are organs that have several jobs. They filter the blood to remove waste products and extra fluid. They also maintain a healthy balance of minerals and hormones in the body, which helps control blood pressure and keep bones strong. With this condition, your kidneys do not do their jobs as well as they should. This condition ranges from mild to severe. Over time, it may develop into long-lasting (chronic) kidney disease. Early detection and treatment may prevent acute kidney injury from developing into a chronic condition. What are the causes? Common causes of this condition include:  A problem with blood flow to the kidneys. This may be caused by: ? Low blood pressure (hypotension) or shock. ? Blood loss. ? Heart and blood vessel (cardiovascular) disease. ? Severe burns. ? Liver disease.  Direct damage to the kidneys. This may be caused by: ? Certain medicines. ? A kidney infection. ? Poisoning. ? Being around or in contact with toxic substances. ? A surgical wound. ? A hard, direct hit to the kidney area.  A sudden blockage of urine flow. This may be caused by: ? Cancer. ? Kidney stones. ? An enlarged prostate in males. What increases the risk? You are more likely to develop this condition if you:  Are older than age 65.  Are male.  Are hospitalized, especially if you are in critical condition.  Have certain conditions, such as: ? Chronic kidney disease. ? Diabetes. ? Coronary artery disease and heart failure. ? Pulmonary disease. ? Chronic liver disease. What are the signs or symptoms? Symptoms of this condition may not be obvious until the condition becomes severe. Symptoms of this condition can include:  Tiredness (lethargy) or difficulty staying awake.  Nausea or vomiting.  Swelling (edema) of the face, legs, ankles, or feet.  Problems with urination, such  as: ? Pain in the abdomen, or pain along the side of your stomach (flank). ? Producing little or no urine. ? Passing urine with a weak flow.  Muscle twitches and cramps, especially in the legs.  Confusion or trouble concentrating.  Loss of appetite.  Fever. How is this diagnosed? Your health care provider can diagnose this condition based on your symptoms, medical history, and a physical exam.  You may also have other tests, such as:  Blood tests.  Urine tests.  Imaging tests.  A test in which a sample of tissue is removed from the kidneys to be examined under a microscope (kidney biopsy). How is this treated? Treatment for this condition depends on the cause and how severe the condition is. In mild cases, treatment may not be needed. The kidneys may heal on their own. In more severe cases, treatment will involve:  Treating the cause of the kidney injury. This may involve changing any medicines you are taking or adjusting your dosage.  Fluids. You may need specialized IV fluids to balance your body's needs.  Having a catheter placed to drain urine and prevent blockages.  Preventing problems from occurring. This may mean avoiding certain medicines or procedures that can cause further injury to the kidneys. In some cases, treatment may also require:  A procedure to remove toxic wastes from the body (dialysis or continuous renal replacement therapy, CRRT).  Surgery. This may be done to repair a torn kidney or to remove the blockage from the urinary system. Follow these instructions at home: Medicines  Take over-the-counter and prescription medicines only as   told by your health care provider.  Do not take any new medicines without your health care provider's approval. Many medicines can worsen your kidney damage.  Do not take any vitamin and mineral supplements without your health care provider's approval. Many nutritional supplements can worsen your kidney  damage. Lifestyle  If your health care provider prescribed changes to your diet, follow them. You may need to decrease the amount of protein you eat.  Achieve and maintain a healthy weight. If you need help with this, ask your health care provider.  Start or continue an exercise plan. Try to exercise at least 30 minutes a day, 5 days a week.  Do not use any products that contain nicotine or tobacco, such as cigarettes, e-cigarettes, and chewing tobacco. If you need help quitting, ask your health care provider.   General instructions  Keep track of your blood pressure. Report changes in your blood pressure as told by your health care provider.  Stay up to date with your vaccines. Ask your health care provider which vaccines you need.  Keep all follow-up visits as told by your health care provider. This is important.   Where to find more information  American Association of Kidney Patients: BombTimer.gl  National Kidney Foundation: www.kidney.[place]: https://mathis.com/  Life Options Rehabilitation Program: ? www.lifeoptions.org ? www.kidneyschool.org Contact a health care provider if:  Your symptoms get worse.  You develop new symptoms. Get help right away if:  You develop symptoms of worsening kidney disease, which include: ? Headaches. ? Abnormally dark or light skin. ? Easy bruising. ? Frequent hiccups. ? Chest pain. ? Shortness of breath. ? End of menstruation in women. ? Seizures. ? Confusion or altered mental status. ? Abdominal or back pain. ? Itchiness.  You have a fever.  Your body is producing less urine.  You have pain or bleeding when you urinate. Summary  Acute kidney injury is a sudden worsening of kidney function.  Acute kidney injury can be caused by problems with blood flow to the kidneys, direct damage to the kidneys, and sudden blockage of urine flow.  Symptoms of this condition may not be obvious until it becomes severe.  Symptoms may include edema, lethargy, confusion, nausea or vomiting, and problems passing urine.  This condition can be diagnosed with blood tests, urine tests, and imaging tests. Sometimes a kidney biopsy is done to diagnose this condition.  Treatment for this condition often involves treating the underlying cause. It is treated with fluids, medicines, diet changes, dialysis, or surgery. This information is not intended to replace advice given to you by your health care provider. Make sure you discuss any questions you have with your health care provider. Document Revised: 03/19/2019 Document Reviewed: 03/19/2019 Elsevier Patient Education  2021 [place].   Dehydration, Adult Dehydration is condition in which there is not enough water or other fluids in the body. This happens when a person loses more fluids than he or she takes in. Important body parts cannot work right without the right amount of fluids. Any loss of fluids from the body can cause dehydration. Dehydration can be mild, worse, or very bad. It should be treated right away to keep it from getting very bad. What are the causes? This condition may be caused by:  Conditions that cause loss of water or other fluids, such as: ? Watery poop (diarrhea). ? Vomiting. ? Sweating a lot. ? Peeing (urinating) a lot.  Not drinking enough fluids, especially when you: ?  Are ill. ? Are doing things that take a lot of energy to do.  Other illnesses and conditions, such as fever or infection.  Certain medicines, such as medicines that take extra fluid out of the body (diuretics).  Lack of safe drinking water.  Not being able to get enough water and food. What increases the risk? The following factors may make you more likely to develop this condition:  Having a long-term (chronic) illness that has not been treated the right way, such as: ? Diabetes. ? Heart disease. ? Kidney disease.  Being 36 years of age or older.  Having  a disability.  Living in a place that is high above the ground or sea (high in altitude). The thinner, dried air causes more fluid loss.  Doing exercises that put stress on your body for a long time. What are the signs or symptoms? Symptoms of dehydration depend on how bad it is. Mild or worse dehydration  Thirst.  Dry lips or dry mouth.  Feeling dizzy or light-headed, especially when you stand up from sitting.  Muscle cramps.  Your body making: ? Dark pee (urine). Pee may be the color of tea. ? Less pee than normal. ? Less tears than normal.  Headache. Very bad dehydration  Changes in skin. Skin may: ? Be cold to the touch (clammy). ? Be blotchy or pale. ? Not go back to normal right after you lightly pinch it and let it go.  Little or no tears, pee, or sweat.  Changes in vital signs, such as: ? Fast breathing. ? Low blood pressure. ? Weak pulse. ? Pulse that is more than 100 beats a minute when you are sitting still.  Other changes, such as: ? Feeling very thirsty. ? Eyes that look hollow (sunken). ? Cold hands and feet. ? Being mixed up (confused). ? Being very tired (lethargic) or having trouble waking from sleep. ? Short-term weight loss. ? Loss of consciousness. How is this treated? Treatment for this condition depends on how bad it is. Treatment should start right away. Do not wait until your condition gets very bad. Very bad dehydration is an emergency. You will need to go to a hospital.  Mild or worse dehydration can be treated at home. You may be asked to: ? Drink more fluids. ? Drink an oral rehydration solution (ORS). This drink helps get the right amounts of fluids and salts and minerals in the blood (electrolytes).  Very bad dehydration can be treated: ? With fluids through an IV tube. ? By getting normal levels of salts and minerals in your blood. This is often done by giving salts and minerals through a tube. The tube is passed through your nose  and into your stomach. ? By treating the root cause. Follow these instructions at home: Oral rehydration solution If told by your doctor, drink an ORS:  Make an ORS. Use instructions on the package.  Start by drinking small amounts, about  cup (120 mL) every 5-10 minutes.  Slowly drink more until you have had the amount that your doctor said to have. Eating and drinking  Drink enough clear fluid to keep your pee pale yellow. If you were told to drink an ORS, finish the ORS first. Then, start slowly drinking other clear fluids. Drink fluids such as: ? Water. Do not drink only water. Doing that can make the salt (sodium) level in your body get too low. ? Water from ice chips you suck on. ? Fruit juice  that you have added water to (diluted). ? Low-calorie sports drinks.  Eat foods that have the right amounts of salts and minerals, such as: ? Bananas. ? Oranges. ? Potatoes. ? Tomatoes. ? Spinach.  Do not drink alcohol.  Avoid: ? Drinks that have a lot of sugar. These include:  High-calorie sports drinks.  Fruit juice that you did not add water to.  Soda.  Caffeine. ? Foods that are greasy or have a lot of fat or sugar.         General instructions  Take over-the-counter and prescription medicines only as told by your doctor.  Do not take salt tablets. Doing that can make the salt level in your body get too high.  Return to your normal activities as told by your doctor. Ask your doctor what activities are safe for you.  Keep all follow-up visits as told by your doctor. This is important. Contact a doctor if:  You have pain in your belly (abdomen) and the pain: ? Gets worse. ? Stays in one place.  You have a rash.  You have a stiff neck.  You get angry or annoyed (irritable) more easily than normal.  You are more tired or have a harder time waking than normal.  You feel: ? Weak or dizzy. ? Very thirsty. Get help right away if you have:  Any symptoms  of very bad dehydration.  Symptoms of vomiting, such as: ? You cannot eat or drink without vomiting. ? Your vomiting gets worse or does not go away. ? Your vomit has blood or green stuff in it.  Symptoms that get worse with treatment.  A fever.  A very bad headache.  Problems with peeing or pooping (having a bowel movement), such as: ? Watery poop that gets worse or does not go away. ? Blood in your poop (stool). This may cause poop to look black and tarry. ? Not peeing in 6-8 hours. ? Peeing only a small amount of very dark pee in 6-8 hours.  Trouble breathing. These symptoms may be an emergency. Do not wait to see if the symptoms will go away. Get medical help right away. Call your local emergency services (911 in the [place] Do not drive yourself to the hospital. Summary  Dehydration is a condition in which there is not enough water or other fluids in the body. This happens when a person loses more fluids than he or she takes in.  Treatment for this condition depends on how bad it is. Treatment should be started right away. Do not wait until your condition gets very bad.  Drink enough clear fluid to keep your pee pale yellow. If you were told to drink an oral rehydration solution (ORS), finish the ORS first. Then, start slowly drinking other clear fluids.  Take over-the-counter and prescription medicines only as told by your doctor.  Get help right away if you have any symptoms of very bad dehydration. This information is not intended to replace advice given to you by your health care provider. Make sure you discuss any questions you have with your health care provider. Document Revised: 12/20/2018 Document Reviewed: 12/20/2018 Elsevier Patient Education  [place].

## 2020-08-26 NOTE — TOC Initial Note (Signed)
Transition of Care [facility]) - Initial/Assessment Note    Patient Details  Name: Jared White MRN: [id number] Date of Birth: 09-Dec-1945  [facility]) CM/SW Contact:    Lynnell Catalan, RN Phone Number: 08/26/2020, 2:06 PM  Clinical Narrative:                 Spoke with pt for dc planning. Pt currently receives [facility] for home ostomy care and for IV infusion of bolus NS every three days. Spoke with [facility] liaison Pam to ensure IV therapy services would be continued at dc. Also connected with [facility] liaison to ensure home health RN services would be continued as well. Orders received and both companies will follow up with pt at home.  Expected Discharge Plan: [place] Barriers to Discharge: No Barriers Identified   Patient Goals and CMS Choice Patient states their goals for this hospitalization and ongoing recovery are:: To get home CMS Medicare.gov Compare Post Acute Care list provided to:: Patient Choice offered to / list presented to : Patient  Expected Discharge Plan and Services Expected Discharge Plan: [place]   Discharge Planning Services: CM Consult Post Acute Care Choice: [place] arrangements for the past 2 months: Single Family Home Expected Discharge Date: 08/26/20                         HH Arranged: RN HH Agency: [place] Date [place]: 08/26/20 Time HH Agency Contacted: 1200 Representative spoke with at [place]: Jeannene Patella and Angie  Prior Living Arrangements/Services Living arrangements for the past 2 months: [place] with:: Self Patient language and need for interpreter reviewed:: Yes Do you feel safe going back to the place where you live?: Yes      Need for Family Participation in Patient Care: Yes (Comment) Care giver support system in place?: Yes (comment) Current home services: Home RN Criminal Activity/Legal Involvement Pertinent to Current  Situation/Hospitalization: No - Comment as needed  Activities of Daily Living Home Assistive Devices/Equipment: Eyeglasses,Blood pressure cuff ADL Screening (condition at time of admission) Patient's cognitive ability adequate to safely complete daily activities?: Yes Is the patient deaf or have difficulty hearing?: No Does the patient have difficulty seeing, even when wearing glasses/contacts?: No Does the patient have difficulty concentrating, remembering, or making decisions?: No Patient able to express need for assistance with ADLs?: Yes Does the patient have difficulty dressing or bathing?: No Independently performs ADLs?: Yes (appropriate for developmental age) Does the patient have difficulty walking or climbing stairs?: Yes (secondary to weakness) Weakness of Legs: Both Weakness of Arms/Hands: None  Permission Sought/Granted Permission sought to share information with : Chartered certified accountant granted to share information with : Yes, Verbal Permission Granted     Permission granted to share info w AGENCY: [facility] and [facility]        Emotional Assessment Appearance:: Appears stated age Attitude/Demeanor/Rapport: Gracious Affect (typically observed): Calm Orientation: : Oriented to Self,Oriented to Place,Oriented to  Time,Oriented to Situation Alcohol / Substance Use: Not Applicable    Admission diagnosis:  Dehydration [E86.0] AKI (acute kidney injury) ([place]) [N17.9] Patient Active Problem List   Diagnosis Date Noted  . AKI (acute kidney injury) ([place]) 08/24/2020  . Preop cardiovascular exam 03/19/2020  . Gastric polyps 02/07/2020  . History of rectal cancer 02/07/2020  . History of colonic polyps 02/07/2020  . Barrett's esophagus without dysplasia 02/07/2020  .  Port-A-Cath in place 11/27/2019  . Weight loss 11/27/2019  . Rectal cancer ([place]) 08/19/2019  . Abnormal MRI, pelvis 07/26/2019  . Abnormal CT scan, pelvis 07/26/2019  . Rectal bleeding  07/26/2019  . Hemorrhoids 07/26/2019  . Constipation 07/26/2019  . Stage 3b chronic kidney disease ([place]) 09/13/2018  . Coronary artery disease involving native coronary artery of native heart without angina pectoris 09/13/2018  . Microalbuminuria 10/16/2015  . Facial numbness   . Hypomagnesemia   . Hypocalcemia 10/08/2015  . Psoriasis 11/21/2013  . S/P CABG x 4 03/14/2013  . Multiple thoracic/lumbar transverse process fractures 12/15/2012  . Fall from roof 12/15/2012  . CAD (coronary artery disease) 12/15/2012  . GERD (gastroesophageal reflux disease) 12/15/2012  . Essential hypertension 12/15/2012  . Hyperlipidemia 12/15/2012  . Multiple bilateral rib fractures    PCP:  [name], MD Pharmacy:   [phone], [address] [place] [address] Phone: [phone] Fax: [phone]     Social Determinants of Health (SDOH) Interventions    Readmission Risk Interventions Readmission Risk Prevention Plan 08/26/2020  Transportation Screening Complete  PCP or Specialist Appt within 3-5 Days Complete  HRI or [place] Complete  Social Work Consult for [place] Planning/Counseling Complete  Palliative Care Screening Not Applicable  Medication Review Press photographer) Complete  Some recent data might be hidden

## 2020-08-26 NOTE — Progress Notes (Signed)
Patient was instructed at beginning of shift to urinate in urinals & empty ostomy in provided colander at toilet, in order for [place] to monitor his intake and output. However, patient never saved any urine or output for staff. When reintegrating the importance, patient explained that he keeps forgetting because of his "memory".

## 2020-08-27 DIAGNOSIS — I129 Hypertensive chronic kidney disease with stage 1 through stage 4 chronic kidney disease, or unspecified chronic kidney disease: Secondary | ICD-10-CM | POA: Diagnosis not present

## 2020-08-27 DIAGNOSIS — Z452 Encounter for adjustment and management of vascular access device: Secondary | ICD-10-CM | POA: Diagnosis not present

## 2020-08-27 DIAGNOSIS — N1832 Chronic kidney disease, stage 3b: Secondary | ICD-10-CM | POA: Diagnosis not present

## 2020-08-27 DIAGNOSIS — C20 Malignant neoplasm of rectum: Secondary | ICD-10-CM | POA: Diagnosis not present

## 2020-08-27 DIAGNOSIS — Z48815 Encounter for surgical aftercare following surgery on the digestive system: Secondary | ICD-10-CM | POA: Diagnosis not present

## 2020-08-27 DIAGNOSIS — Z432 Encounter for attention to ileostomy: Secondary | ICD-10-CM | POA: Diagnosis not present

## 2020-08-31 DIAGNOSIS — Z432 Encounter for attention to ileostomy: Secondary | ICD-10-CM | POA: Diagnosis not present

## 2020-08-31 DIAGNOSIS — Z48815 Encounter for surgical aftercare following surgery on the digestive system: Secondary | ICD-10-CM | POA: Diagnosis not present

## 2020-08-31 DIAGNOSIS — N1832 Chronic kidney disease, stage 3b: Secondary | ICD-10-CM | POA: Diagnosis not present

## 2020-08-31 DIAGNOSIS — I129 Hypertensive chronic kidney disease with stage 1 through stage 4 chronic kidney disease, or unspecified chronic kidney disease: Secondary | ICD-10-CM | POA: Diagnosis not present

## 2020-08-31 DIAGNOSIS — C20 Malignant neoplasm of rectum: Secondary | ICD-10-CM | POA: Diagnosis not present

## 2020-08-31 DIAGNOSIS — Z452 Encounter for adjustment and management of vascular access device: Secondary | ICD-10-CM | POA: Diagnosis not present

## 2020-09-03 DIAGNOSIS — N1832 Chronic kidney disease, stage 3b: Secondary | ICD-10-CM | POA: Diagnosis not present

## 2020-09-03 DIAGNOSIS — I129 Hypertensive chronic kidney disease with stage 1 through stage 4 chronic kidney disease, or unspecified chronic kidney disease: Secondary | ICD-10-CM | POA: Diagnosis not present

## 2020-09-03 DIAGNOSIS — Z432 Encounter for attention to ileostomy: Secondary | ICD-10-CM | POA: Diagnosis not present

## 2020-09-03 DIAGNOSIS — Z48815 Encounter for surgical aftercare following surgery on the digestive system: Secondary | ICD-10-CM | POA: Diagnosis not present

## 2020-09-03 DIAGNOSIS — Z452 Encounter for adjustment and management of vascular access device: Secondary | ICD-10-CM | POA: Diagnosis not present

## 2020-09-03 DIAGNOSIS — C20 Malignant neoplasm of rectum: Secondary | ICD-10-CM | POA: Diagnosis not present

## 2020-09-06 DIAGNOSIS — C20 Malignant neoplasm of rectum: Secondary | ICD-10-CM | POA: Diagnosis not present

## 2020-09-07 ENCOUNTER — Other Ambulatory Visit: Payer: Self-pay | Admitting: General Surgery

## 2020-09-07 DIAGNOSIS — Z452 Encounter for adjustment and management of vascular access device: Secondary | ICD-10-CM | POA: Diagnosis not present

## 2020-09-07 DIAGNOSIS — Z432 Encounter for attention to ileostomy: Secondary | ICD-10-CM | POA: Diagnosis not present

## 2020-09-07 DIAGNOSIS — N1832 Chronic kidney disease, stage 3b: Secondary | ICD-10-CM | POA: Diagnosis not present

## 2020-09-07 DIAGNOSIS — C20 Malignant neoplasm of rectum: Secondary | ICD-10-CM | POA: Diagnosis not present

## 2020-09-07 DIAGNOSIS — Z48815 Encounter for surgical aftercare following surgery on the digestive system: Secondary | ICD-10-CM | POA: Diagnosis not present

## 2020-09-07 DIAGNOSIS — I129 Hypertensive chronic kidney disease with stage 1 through stage 4 chronic kidney disease, or unspecified chronic kidney disease: Secondary | ICD-10-CM | POA: Diagnosis not present

## 2020-09-08 ENCOUNTER — Other Ambulatory Visit: Payer: Medicare Other

## 2020-09-09 DIAGNOSIS — Z48815 Encounter for surgical aftercare following surgery on the digestive system: Secondary | ICD-10-CM | POA: Diagnosis not present

## 2020-09-09 DIAGNOSIS — C20 Malignant neoplasm of rectum: Secondary | ICD-10-CM | POA: Diagnosis not present

## 2020-09-09 DIAGNOSIS — N1832 Chronic kidney disease, stage 3b: Secondary | ICD-10-CM | POA: Diagnosis not present

## 2020-09-09 DIAGNOSIS — I129 Hypertensive chronic kidney disease with stage 1 through stage 4 chronic kidney disease, or unspecified chronic kidney disease: Secondary | ICD-10-CM | POA: Diagnosis not present

## 2020-09-09 DIAGNOSIS — Z432 Encounter for attention to ileostomy: Secondary | ICD-10-CM | POA: Diagnosis not present

## 2020-09-09 DIAGNOSIS — Z452 Encounter for adjustment and management of vascular access device: Secondary | ICD-10-CM | POA: Diagnosis not present

## 2020-09-11 DIAGNOSIS — N1832 Chronic kidney disease, stage 3b: Secondary | ICD-10-CM | POA: Diagnosis not present

## 2020-09-11 DIAGNOSIS — Z432 Encounter for attention to ileostomy: Secondary | ICD-10-CM | POA: Diagnosis not present

## 2020-09-11 DIAGNOSIS — C20 Malignant neoplasm of rectum: Secondary | ICD-10-CM | POA: Diagnosis not present

## 2020-09-11 DIAGNOSIS — Z48815 Encounter for surgical aftercare following surgery on the digestive system: Secondary | ICD-10-CM | POA: Diagnosis not present

## 2020-09-11 DIAGNOSIS — I129 Hypertensive chronic kidney disease with stage 1 through stage 4 chronic kidney disease, or unspecified chronic kidney disease: Secondary | ICD-10-CM | POA: Diagnosis not present

## 2020-09-11 DIAGNOSIS — Z452 Encounter for adjustment and management of vascular access device: Secondary | ICD-10-CM | POA: Diagnosis not present

## 2020-09-12 DIAGNOSIS — C20 Malignant neoplasm of rectum: Secondary | ICD-10-CM | POA: Diagnosis not present

## 2020-09-14 DIAGNOSIS — N1832 Chronic kidney disease, stage 3b: Secondary | ICD-10-CM | POA: Diagnosis not present

## 2020-09-14 DIAGNOSIS — Z48815 Encounter for surgical aftercare following surgery on the digestive system: Secondary | ICD-10-CM | POA: Diagnosis not present

## 2020-09-14 DIAGNOSIS — Z432 Encounter for attention to ileostomy: Secondary | ICD-10-CM | POA: Diagnosis not present

## 2020-09-14 DIAGNOSIS — Z452 Encounter for adjustment and management of vascular access device: Secondary | ICD-10-CM | POA: Diagnosis not present

## 2020-09-14 DIAGNOSIS — I129 Hypertensive chronic kidney disease with stage 1 through stage 4 chronic kidney disease, or unspecified chronic kidney disease: Secondary | ICD-10-CM | POA: Diagnosis not present

## 2020-09-14 DIAGNOSIS — C20 Malignant neoplasm of rectum: Secondary | ICD-10-CM | POA: Diagnosis not present

## 2020-09-15 DIAGNOSIS — C20 Malignant neoplasm of rectum: Secondary | ICD-10-CM | POA: Diagnosis not present

## 2020-09-17 ENCOUNTER — Ambulatory Visit
Admission: RE | Admit: 2020-09-17 | Discharge: 2020-09-17 | Disposition: A | Discharge status: Home / Self Care | Payer: Medicare Other | Source: Ambulatory Visit | Attending: General Surgery | Admitting: General Surgery

## 2020-09-17 DIAGNOSIS — I129 Hypertensive chronic kidney disease with stage 1 through stage 4 chronic kidney disease, or unspecified chronic kidney disease: Secondary | ICD-10-CM | POA: Diagnosis not present

## 2020-09-17 DIAGNOSIS — C20 Malignant neoplasm of rectum: Secondary | ICD-10-CM

## 2020-09-17 DIAGNOSIS — Z48815 Encounter for surgical aftercare following surgery on the digestive system: Secondary | ICD-10-CM | POA: Diagnosis not present

## 2020-09-17 DIAGNOSIS — Z452 Encounter for adjustment and management of vascular access device: Secondary | ICD-10-CM | POA: Diagnosis not present

## 2020-09-17 DIAGNOSIS — K5730 Diverticulosis of large intestine without perforation or abscess without bleeding: Secondary | ICD-10-CM | POA: Diagnosis not present

## 2020-09-17 DIAGNOSIS — Z432 Encounter for attention to ileostomy: Secondary | ICD-10-CM | POA: Diagnosis not present

## 2020-09-17 DIAGNOSIS — N1832 Chronic kidney disease, stage 3b: Secondary | ICD-10-CM | POA: Diagnosis not present

## 2020-09-17 IMAGING — RF DG BE SINGLE CONTRAST
1 series · 14 of 14 positions shown · non-contrast
Comparison: CT abdomen pelvis [DATE]

CLINICAL DATA: Rectal carcinoma post resection and ileostomy.

EXAM:
WATER SOLUBLE CONTRAST ENEMA
TECHNIQUE: Water-soluble contrast utilized for the enema. A Foley catheter was
utilized in the rectum.
FLUOROSCOPY TIME:  Fluoroscopy Time:  2 minutes 6 seconds
Radiation Exposure Index (if provided by the fluoroscopic device):
Number of Acquired Spot Images: 14

[Series 1: one shot · 0.14mm/px · 14 of 14 slices shown]
[im 1/14]
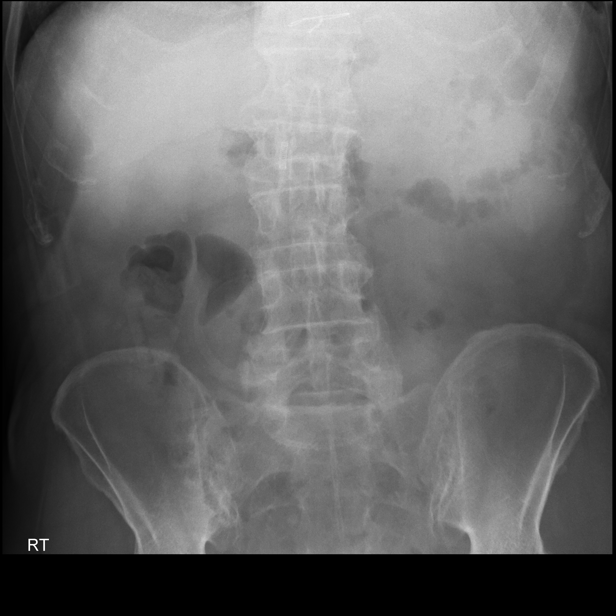
[im 2/14]
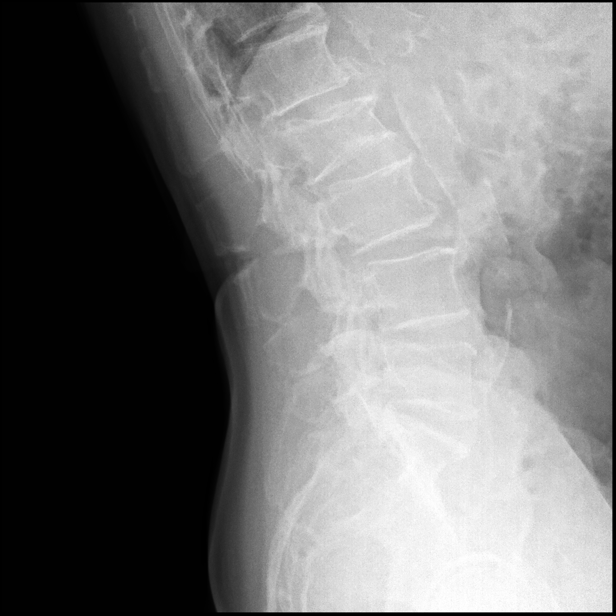
[im 3/14]
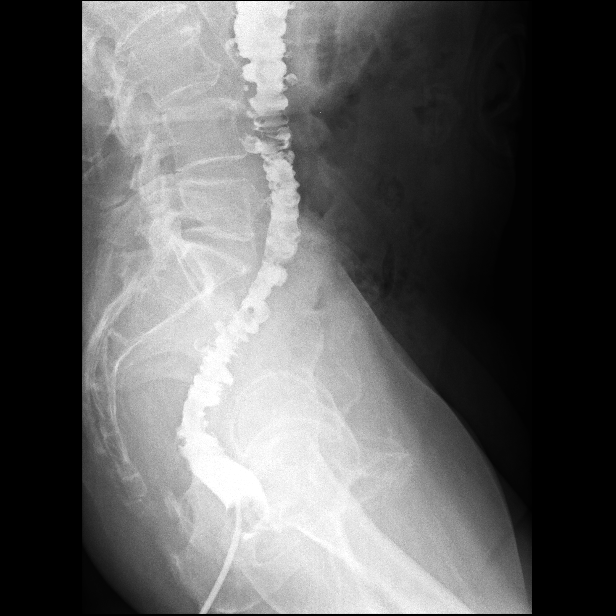
[im 4/14]
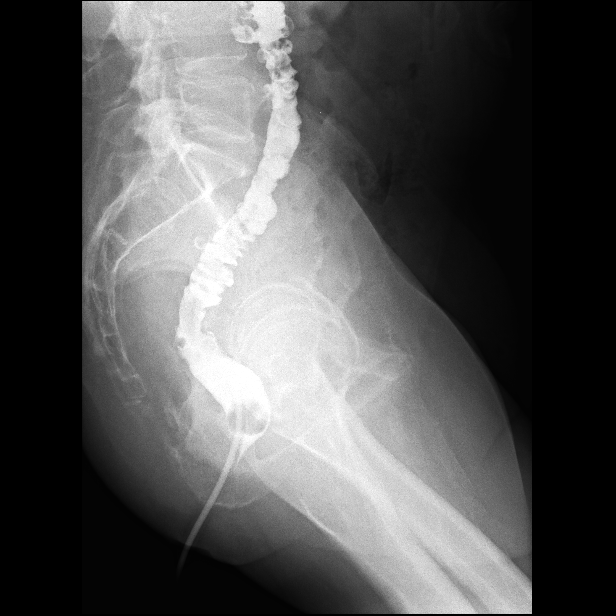
[im 5/14]
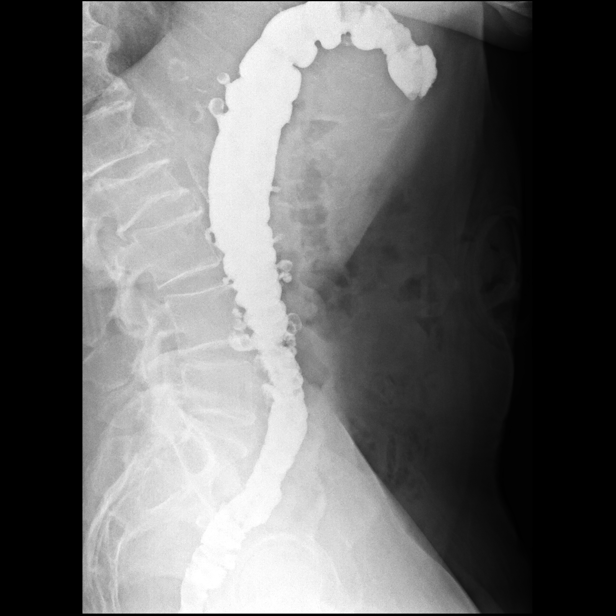
[im 6/14]
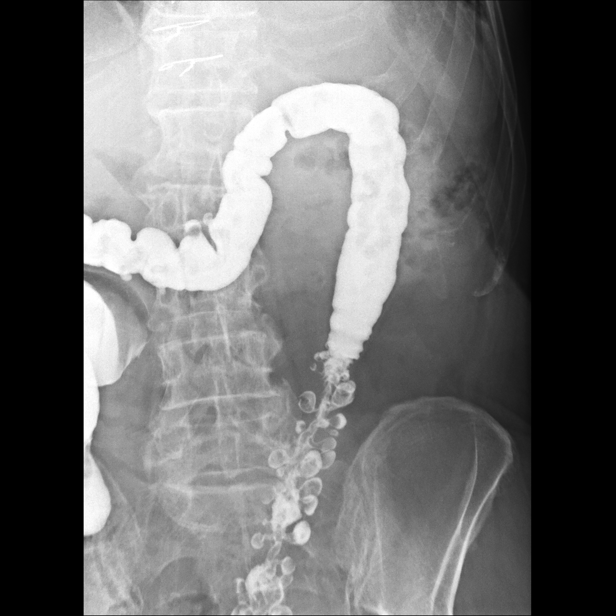
[im 7/14]
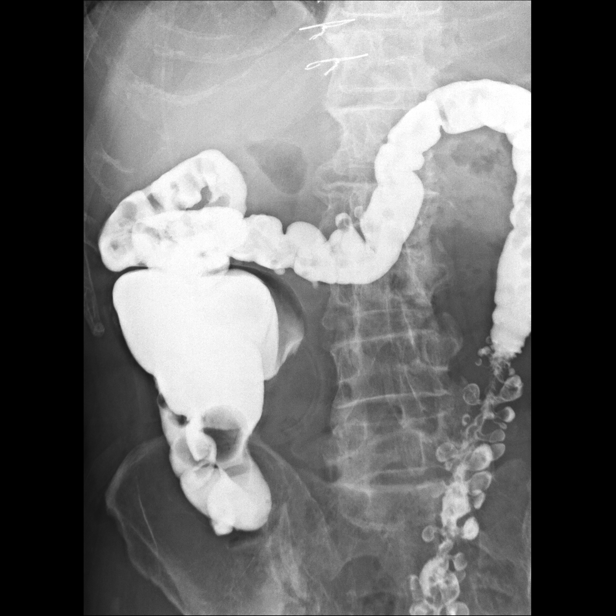
[im 8/14]
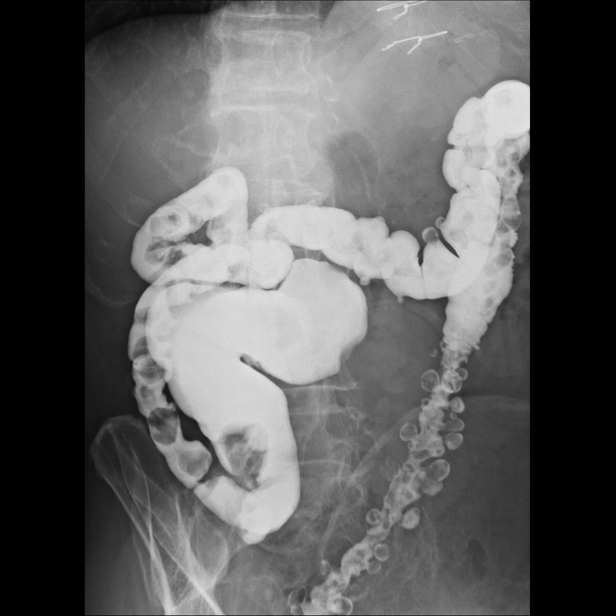
[im 9/14]
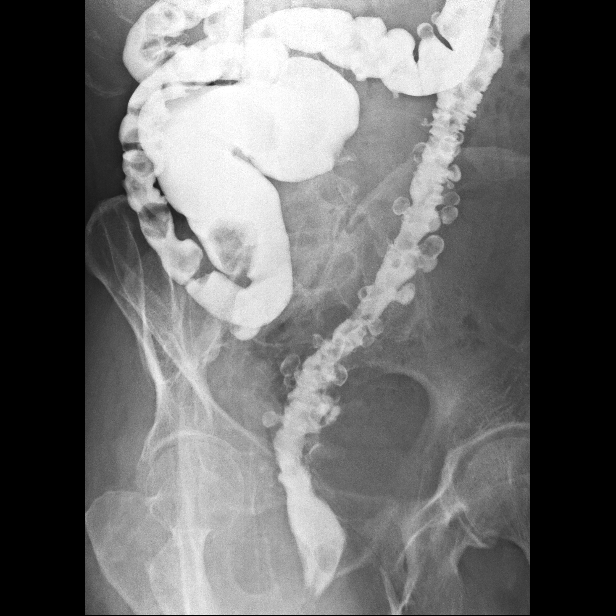
[im 10/14]
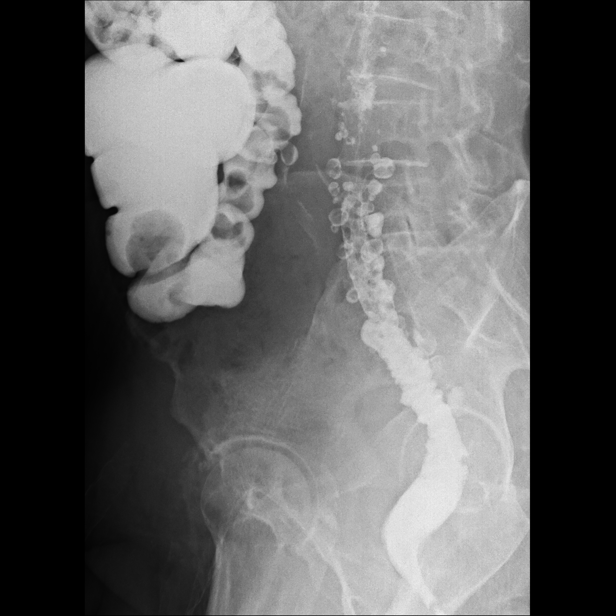
[im 11/14]
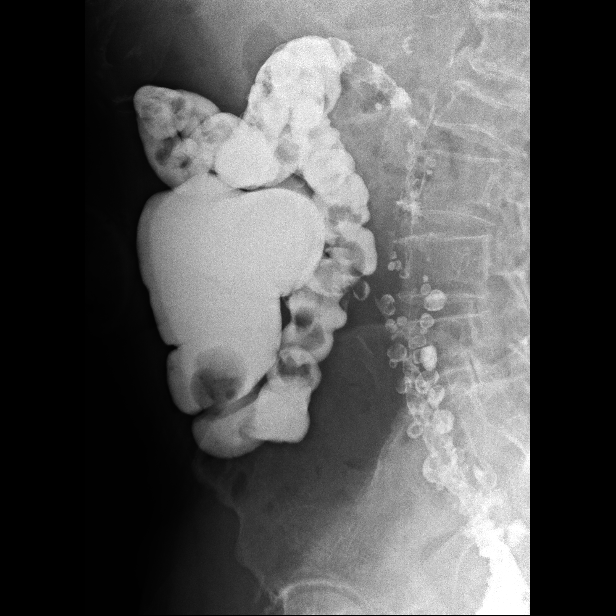
[im 12/14]
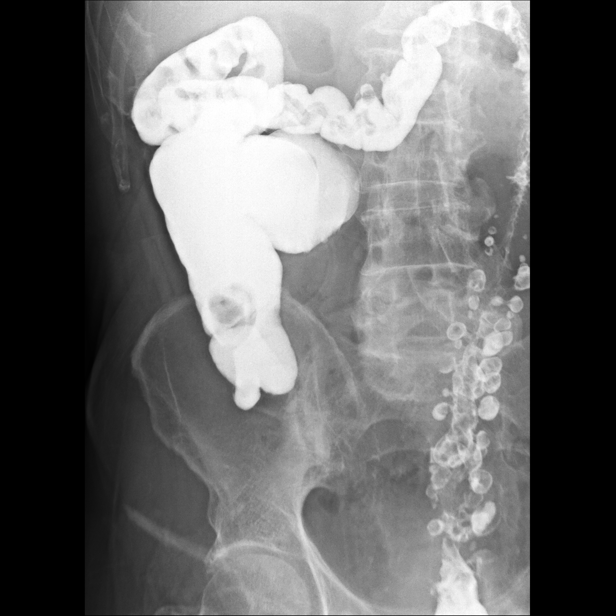
[im 13/14]
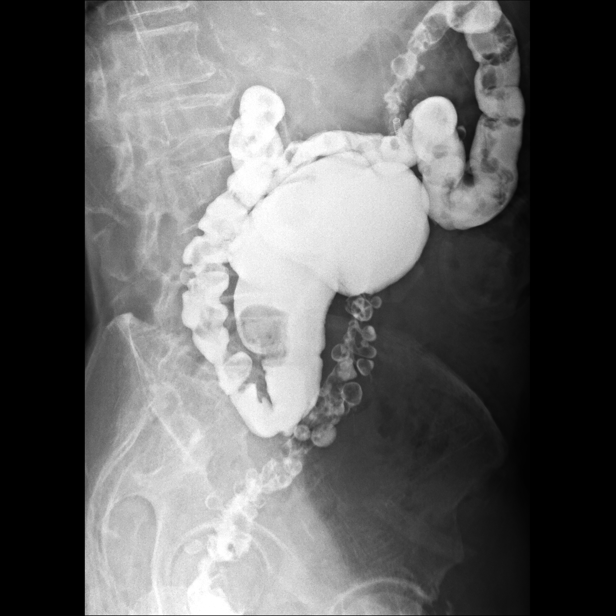
[im 14/14]
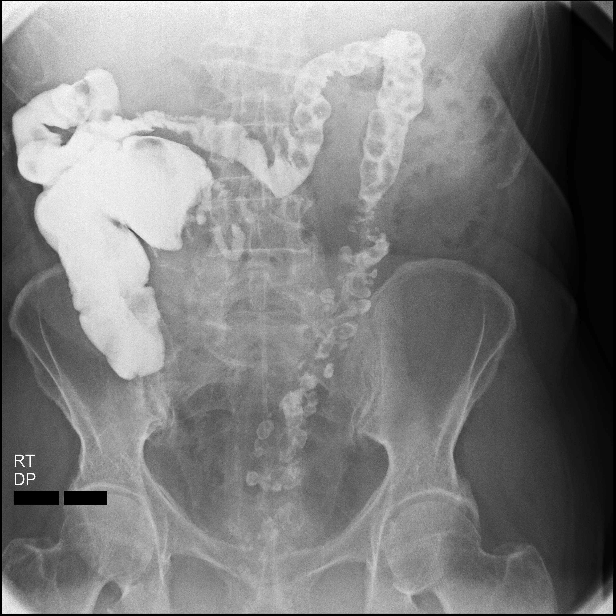

[14 of 14 positions shown; findings below may reference images not displayed]

FINDINGS: Low anterior resection.  No stricture or mass in the rectum.

There is stool throughout the colon with numerous filling defects in
the right colon, transverse colon and proximal left colon. This
makes evaluation for polyp difficult. No stricture or mass lesion.

Extensive colonic diverticulosis most severe in the left colon.
Small amount of reflux into the terminal ileum.

Preliminary KUB demonstrates normal bowel gas pattern. Chronic
compression fracture L2 unchanged from prior CT.
IMPRESSION: Postop low anterior resection of rectal carcinoma. No mass or
stricture in the rectum.

Retained stool throughout the colon precludes evaluation for polyps.

Extensive diverticulosis especially in the left colon. Mild
diverticulosis in the sigmoid colon.

## 2020-09-18 ENCOUNTER — Ambulatory Visit: Payer: Self-pay | Admitting: General Surgery

## 2020-09-18 DIAGNOSIS — I129 Hypertensive chronic kidney disease with stage 1 through stage 4 chronic kidney disease, or unspecified chronic kidney disease: Secondary | ICD-10-CM | POA: Diagnosis not present

## 2020-09-18 DIAGNOSIS — Z452 Encounter for adjustment and management of vascular access device: Secondary | ICD-10-CM | POA: Diagnosis not present

## 2020-09-18 DIAGNOSIS — N1832 Chronic kidney disease, stage 3b: Secondary | ICD-10-CM | POA: Diagnosis not present

## 2020-09-18 DIAGNOSIS — Z432 Encounter for attention to ileostomy: Secondary | ICD-10-CM | POA: Diagnosis not present

## 2020-09-18 DIAGNOSIS — C20 Malignant neoplasm of rectum: Secondary | ICD-10-CM | POA: Diagnosis not present

## 2020-09-18 DIAGNOSIS — Z48815 Encounter for surgical aftercare following surgery on the digestive system: Secondary | ICD-10-CM | POA: Diagnosis not present

## 2020-09-21 DIAGNOSIS — C20 Malignant neoplasm of rectum: Secondary | ICD-10-CM | POA: Diagnosis not present

## 2020-09-21 DIAGNOSIS — Z452 Encounter for adjustment and management of vascular access device: Secondary | ICD-10-CM | POA: Diagnosis not present

## 2020-09-21 DIAGNOSIS — I129 Hypertensive chronic kidney disease with stage 1 through stage 4 chronic kidney disease, or unspecified chronic kidney disease: Secondary | ICD-10-CM | POA: Diagnosis not present

## 2020-09-21 DIAGNOSIS — Z483 Aftercare following surgery for neoplasm: Secondary | ICD-10-CM | POA: Diagnosis not present

## 2020-09-21 DIAGNOSIS — I2510 Atherosclerotic heart disease of native coronary artery without angina pectoris: Secondary | ICD-10-CM | POA: Diagnosis not present

## 2020-09-21 DIAGNOSIS — N1832 Chronic kidney disease, stage 3b: Secondary | ICD-10-CM | POA: Diagnosis not present

## 2020-09-21 DIAGNOSIS — Z85048 Personal history of other malignant neoplasm of rectum, rectosigmoid junction, and anus: Secondary | ICD-10-CM | POA: Diagnosis not present

## 2020-09-24 DIAGNOSIS — I2510 Atherosclerotic heart disease of native coronary artery without angina pectoris: Secondary | ICD-10-CM | POA: Diagnosis not present

## 2020-09-24 DIAGNOSIS — Z452 Encounter for adjustment and management of vascular access device: Secondary | ICD-10-CM | POA: Diagnosis not present

## 2020-09-24 DIAGNOSIS — Z85048 Personal history of other malignant neoplasm of rectum, rectosigmoid junction, and anus: Secondary | ICD-10-CM | POA: Diagnosis not present

## 2020-09-24 DIAGNOSIS — N1832 Chronic kidney disease, stage 3b: Secondary | ICD-10-CM | POA: Diagnosis not present

## 2020-09-24 DIAGNOSIS — I129 Hypertensive chronic kidney disease with stage 1 through stage 4 chronic kidney disease, or unspecified chronic kidney disease: Secondary | ICD-10-CM | POA: Diagnosis not present

## 2020-09-24 DIAGNOSIS — Z483 Aftercare following surgery for neoplasm: Secondary | ICD-10-CM | POA: Diagnosis not present

## 2020-09-25 DIAGNOSIS — I129 Hypertensive chronic kidney disease with stage 1 through stage 4 chronic kidney disease, or unspecified chronic kidney disease: Secondary | ICD-10-CM | POA: Diagnosis not present

## 2020-09-25 DIAGNOSIS — I2510 Atherosclerotic heart disease of native coronary artery without angina pectoris: Secondary | ICD-10-CM | POA: Diagnosis not present

## 2020-09-25 DIAGNOSIS — N1832 Chronic kidney disease, stage 3b: Secondary | ICD-10-CM | POA: Diagnosis not present

## 2020-09-25 DIAGNOSIS — Z85048 Personal history of other malignant neoplasm of rectum, rectosigmoid junction, and anus: Secondary | ICD-10-CM | POA: Diagnosis not present

## 2020-09-25 DIAGNOSIS — Z452 Encounter for adjustment and management of vascular access device: Secondary | ICD-10-CM | POA: Diagnosis not present

## 2020-09-25 DIAGNOSIS — Z483 Aftercare following surgery for neoplasm: Secondary | ICD-10-CM | POA: Diagnosis not present

## 2020-09-28 DIAGNOSIS — Z85048 Personal history of other malignant neoplasm of rectum, rectosigmoid junction, and anus: Secondary | ICD-10-CM | POA: Diagnosis not present

## 2020-09-28 DIAGNOSIS — I129 Hypertensive chronic kidney disease with stage 1 through stage 4 chronic kidney disease, or unspecified chronic kidney disease: Secondary | ICD-10-CM | POA: Diagnosis not present

## 2020-09-28 DIAGNOSIS — Z452 Encounter for adjustment and management of vascular access device: Secondary | ICD-10-CM | POA: Diagnosis not present

## 2020-09-28 DIAGNOSIS — Z483 Aftercare following surgery for neoplasm: Secondary | ICD-10-CM | POA: Diagnosis not present

## 2020-09-28 DIAGNOSIS — I2510 Atherosclerotic heart disease of native coronary artery without angina pectoris: Secondary | ICD-10-CM | POA: Diagnosis not present

## 2020-09-28 DIAGNOSIS — N1832 Chronic kidney disease, stage 3b: Secondary | ICD-10-CM | POA: Diagnosis not present

## 2020-09-28 NOTE — Progress Notes (Signed)
DUE TO COVID-19 ONLY ONE VISITOR IS ALLOWED TO COME WITH YOU AND STAY IN THE WAITING ROOM ONLY DURING PRE OP AND PROCEDURE DAY OF SURGERY. THE 1 VISITOR  MAY VISIT WITH YOU AFTER SURGERY IN YOUR PRIVATE ROOM DURING VISITING HOURS ONLY!  YOU NEED TO HAVE A COVID 19 TEST ON__5/17/2022 _____ @_______ , THIS TEST MUST BE DONE BEFORE SURGERY,  COVID TESTING SITE [address] [place] [address], IT IS ON THE RIGHT GOING OUT [address] APPROXIMATELY  2 MINUTES PAST [employer] ON THE RIGHT. ONCE YOUR COVID TEST IS COMPLETED,  PLEASE BEGIN THE QUARANTINE INSTRUCTIONS AS OUTLINED IN YOUR HANDOUT.                Jared White  09/28/2020   Your procedure is scheduled on:  10/09/2020   Report to [facility] Main  Entrance   Report to admitting at     12 noon      Call this number if you have problems the morning of surgery [phone]    Remember: Do not eat food , candy gum or mints :After Midnight. You may have clear liquids from midnight until  1100am     CLEAR LIQUID DIET   Foods Allowed                                                                       Coffee and tea, regular and decaf                              Plain Jell-O any favor except red or purple                                            Fruit ices (not with fruit pulp)                                      Iced Popsicles                                     Carbonated beverages, regular and diet                                    Cranberry, grape and apple juices Sports drinks like Gatorade Lightly seasoned clear broth or consume(fat free) Sugar, honey syrup   _____________________________________________________________________    BRUSH YOUR TEETH MORNING OF SURGERY AND RINSE YOUR MOUTH OUT, NO CHEWING GUM CANDY OR MINTS.     Take these medicines the morning of surgery with A SIP OF WATER:    prilosec  DO NOT TAKE ANY DIABETIC MEDICATIONS DAY OF YOUR SURGERY                                You may not  have any metal on your body including hair pins and              piercings  Do not wear jewelry, make-up, lotions, powders or perfumes, deodorant             Do not wear nail polish on your fingernails.  Do not shave  48 hours prior to surgery.              Men may shave face and neck.   Do not bring valuables to the hospital. [place].  Contacts, dentures or bridgework may not be worn into surgery.  Leave suitcase in the car. After surgery it may be brought to your room.     Patients discharged the day of surgery will not be allowed to drive home. IF YOU ARE HAVING SURGERY AND GOING HOME THE SAME DAY, YOU MUST HAVE AN ADULT TO DRIVE YOU HOME AND BE WITH YOU FOR 24 HOURS. YOU MAY GO HOME BY TAXI OR UBER OR ORTHERWISE, BUT AN ADULT MUST ACCOMPANY YOU HOME AND STAY WITH YOU FOR 24 HOURS.  Name and phone number of your driver:  Special Instructions: N/A              Please read over the following fact sheets you were given: _____________________________________________________________________  [facility] - Preparing for Surgery Before surgery, you can play an important role.  Because skin is not sterile, your skin needs to be as free of germs as possible.  You can reduce the number of germs on your skin by washing with CHG (chlorahexidine gluconate) soap before surgery.  CHG is an antiseptic cleaner which kills germs and bonds with the skin to continue killing germs even after washing. Please DO NOT use if you have an allergy to CHG or antibacterial soaps.  If your skin becomes reddened/irritated stop using the CHG and inform your nurse when you arrive at Short Stay. Do not shave (including legs and underarms) for at least 48 hours prior to the first CHG shower.  You may shave your face/neck. Please follow these instructions carefully:  1.  Shower with CHG Soap the night before surgery and the  morning of Surgery.  2.  If you  choose to wash your hair, wash your hair first as usual with your  normal  shampoo.  3.  After you shampoo, rinse your hair and body thoroughly to remove the  shampoo.                           4.  Use CHG as you would any other liquid soap.  You can apply chg directly  to the skin and wash                       Gently with a scrungie or clean washcloth.  5.  Apply the CHG Soap to your body ONLY FROM THE NECK DOWN.   Do not use on face/ open                           Wound or open sores. Avoid contact with eyes, ears mouth and genitals (private parts).  Wash face,  Genitals (private parts) with your normal soap.             6.  Wash thoroughly, paying special attention to the area where your surgery  will be performed.  7.  Thoroughly rinse your body with warm water from the neck down.  8.  DO NOT shower/wash with your normal soap after using and rinsing off  the CHG Soap.                9.  Pat yourself dry with a clean towel.            10.  Wear clean pajamas.            11.  Place clean sheets on your bed the night of your first shower and do not  sleep with pets. Day of Surgery : Do not apply any lotions/deodorants the morning of surgery.  Please wear clean clothes to the hospital/surgery center.  FAILURE TO FOLLOW THESE INSTRUCTIONS MAY RESULT IN THE CANCELLATION OF YOUR SURGERY PATIENT SIGNATURE_________________________________  NURSE SIGNATURE__________________________________  ________________________________________________________________________

## 2020-09-30 ENCOUNTER — Other Ambulatory Visit: Payer: Self-pay

## 2020-09-30 ENCOUNTER — Encounter (HOSPITAL_COMMUNITY): Payer: Self-pay

## 2020-09-30 ENCOUNTER — Encounter (HOSPITAL_COMMUNITY)
Admission: RE | Admit: 2020-09-30 | Discharge: 2020-09-30 | Disposition: A | Discharge status: Home / Self Care | Payer: Medicare Other | Source: Ambulatory Visit | Attending: General Surgery | Admitting: General Surgery

## 2020-09-30 DIAGNOSIS — Z7982 Long term (current) use of aspirin: Secondary | ICD-10-CM | POA: Diagnosis not present

## 2020-09-30 DIAGNOSIS — Z951 Presence of aortocoronary bypass graft: Secondary | ICD-10-CM | POA: Diagnosis not present

## 2020-09-30 DIAGNOSIS — C20 Malignant neoplasm of rectum: Secondary | ICD-10-CM | POA: Insufficient documentation

## 2020-09-30 DIAGNOSIS — Z79899 Other long term (current) drug therapy: Secondary | ICD-10-CM | POA: Insufficient documentation

## 2020-09-30 DIAGNOSIS — K219 Gastro-esophageal reflux disease without esophagitis: Secondary | ICD-10-CM | POA: Diagnosis not present

## 2020-09-30 DIAGNOSIS — Z01812 Encounter for preprocedural laboratory examination: Secondary | ICD-10-CM | POA: Diagnosis not present

## 2020-09-30 DIAGNOSIS — I130 Hypertensive heart and chronic kidney disease with heart failure and stage 1 through stage 4 chronic kidney disease, or unspecified chronic kidney disease: Secondary | ICD-10-CM | POA: Diagnosis not present

## 2020-09-30 DIAGNOSIS — I2510 Atherosclerotic heart disease of native coronary artery without angina pectoris: Secondary | ICD-10-CM | POA: Diagnosis not present

## 2020-09-30 DIAGNOSIS — I509 Heart failure, unspecified: Secondary | ICD-10-CM | POA: Diagnosis not present

## 2020-09-30 DIAGNOSIS — N183 Chronic kidney disease, stage 3 unspecified: Secondary | ICD-10-CM | POA: Diagnosis not present

## 2020-09-30 HISTORY — DX: Cardiac murmur, unspecified: R01.1

## 2020-09-30 HISTORY — DX: Acute myocardial infarction, unspecified: I21.9

## 2020-09-30 LAB — BASIC METABOLIC PANEL
Anion gap: 9 (ref 5–15)
BUN: 40 mg/dL — ABNORMAL HIGH (ref 8–23)
CO2: 24 mmol/L (ref 22–32)
Calcium: 8.6 mg/dL — ABNORMAL LOW (ref 8.9–10.3)
Chloride: 109 mmol/L (ref 98–111)
Creatinine, Ser: 2.11 mg/dL — ABNORMAL HIGH (ref 0.61–1.24)
GFR, Estimated: 32 mL/min — ABNORMAL LOW (ref >60)
Glucose, Bld: 116 mg/dL — ABNORMAL HIGH (ref 70–99)
Potassium: 4.9 mmol/L (ref 3.5–5.1)
Sodium: 142 mmol/L (ref 135–145)

## 2020-09-30 LAB — CBC
HCT: 32.8 % — ABNORMAL LOW (ref 39.0–52.0)
Hemoglobin: 10.6 g/dL — ABNORMAL LOW (ref 13.0–17.0)
MCH: 31.6 pg (ref 26.0–34.0)
MCHC: 32.3 g/dL (ref 30.0–36.0)
MCV: 97.9 fL (ref 80.0–100.0)
Platelets: 121 10*3/uL — ABNORMAL LOW (ref 150–400)
RBC: 3.35 MIL/uL — ABNORMAL LOW (ref 4.22–5.81)
RDW: 15.2 % (ref 11.5–15.5)
WBC: 6.5 10*3/uL (ref 4.0–10.5)
nRBC: 0 % (ref 0.0–0.2)

## 2020-09-30 LAB — GLUCOSE, CAPILLARY: Glucose-Capillary: 126 mg/dL — ABNORMAL HIGH (ref 70–99)

## 2020-09-30 LAB — HEMOGLOBIN A1C
Hgb A1c MFr Bld: 5.9 % — ABNORMAL HIGH (ref 4.8–5.6)
Mean Plasma Glucose: 122.63 mg/dL

## 2020-09-30 NOTE — Progress Notes (Signed)
Pt overslept for preop appt.  Hx and instructions done via phone.  Pt to come in after 1pm on 09/30/2020 for preop labs and to pick up hibiclens and instructions.

## 2020-09-30 NOTE — Progress Notes (Addendum)
Anesthesia Review:  PCP: Cindee Lame  Cardiologist : DR Virgina Jock - L[facility] 03/19/20  LOV - Dr Einar Gip- 03/30/2020  Kidney MD- Madelon Lips with  [facility] - [place] 06/06/2019  Chest x-ray : CT chest- 04/30/2020, 1View 08/24/20  EKG : 03/19/20  Echo :03/25/2020  Stress test: 03/31/2020  Cardiac Cath :  Activity level: can do a flight of stairs without difficulty  Sleep Study/ CPAP : Fasting Blood Sugar :      / Checks Blood Sugar -- times a day:   Blood Thinner/ Instructions /Last Dose: ASA / Instructions/ Last Dose :  Prediabetes - does not check glucose at home  hgba1c- 09/30/20- 5.9  PORT in place  08/24/20- In ED with acute kidney injury  aSA- 81 mg  PT currently receiving via home Health - IV Fluids two times per week via [place] and bmp done 09/30/20 routed to Dr Marcello Moores.

## 2020-10-01 DIAGNOSIS — N1832 Chronic kidney disease, stage 3b: Secondary | ICD-10-CM | POA: Diagnosis not present

## 2020-10-01 DIAGNOSIS — Z452 Encounter for adjustment and management of vascular access device: Secondary | ICD-10-CM | POA: Diagnosis not present

## 2020-10-01 DIAGNOSIS — I2510 Atherosclerotic heart disease of native coronary artery without angina pectoris: Secondary | ICD-10-CM | POA: Diagnosis not present

## 2020-10-01 DIAGNOSIS — Z85048 Personal history of other malignant neoplasm of rectum, rectosigmoid junction, and anus: Secondary | ICD-10-CM | POA: Diagnosis not present

## 2020-10-01 DIAGNOSIS — I129 Hypertensive chronic kidney disease with stage 1 through stage 4 chronic kidney disease, or unspecified chronic kidney disease: Secondary | ICD-10-CM | POA: Diagnosis not present

## 2020-10-01 DIAGNOSIS — Z483 Aftercare following surgery for neoplasm: Secondary | ICD-10-CM | POA: Diagnosis not present

## 2020-10-01 NOTE — Progress Notes (Signed)
Anesthesia Chart Review   Case: [id number] Date/Time: 10/09/20 1345   Procedure: LOOP ILEOSTOMY REVERSAL (N/A )   Anesthesia type: General   Pre-op diagnosis: RECTAL CANCER   Location: [facility] ROOM 02 / [facility]   Surgeons: Leighton Ruff, MD      VFIEPPIRJJ:75 y.o. never smoker with h/o GERD, HTN, CHF, CAD (CABG 2001), CKD stage III followed by [facility], rectal cancer s/p ostomy scheduled for above procedure 09/05/6061 with Dr. Leighton Ruff.   Pt last seen by cardiology 03/19/2020. Per OV note, "Stable. Recommend aspirin 81 mg, Crestor 20 mg. Given his low functional capacity and possible upcoming rectal cancer surgery, recommend echocardiogram and exercise/lexiscan nuclear stress test for pre-op risk stratification."   Low risk stress test 03/30/2020. Per results, "Low risk for surgery. May proceed." Echo 03/23/2020 with EF 50-55%.   Recent hospital admission 4/4-08/26/2020 with AKI secondary to high-ouput ostomy, renal function improved.  He has continued with oral hydration at home since discharge.  VS: BP 129/60   Pulse 63   Temp 36.8 C (Oral)   Resp 18   Ht 5\' 8"  (1.727 m)   Wt 63 kg   SpO2 100%   BMI 21.13 kg/m   PROVIDERS: [name], MD is PCP   Phineas Real, MD is Cardiologist  LABS: Labs reviewed: Acceptable for surgery. (all labs ordered are listed, but only abnormal results are displayed)  Labs Reviewed  BASIC METABOLIC PANEL - Abnormal; Notable for the following components:      Result Value   Glucose, Bld 116 (*)    BUN 40 (*)    Creatinine, Ser 2.11 (*)    Calcium 8.6 (*)    GFR, Estimated 32 (*)    All other components within normal limits  CBC - Abnormal; Notable for the following components:   RBC 3.35 (*)    Hemoglobin 10.6 (*)    HCT 32.8 (*)    Platelets 121 (*)    All other components within normal limits  HEMOGLOBIN A1C - Abnormal; Notable for the following components:   Hgb A1c MFr Bld 5.9 (*)    All other components within  normal limits  GLUCOSE, CAPILLARY - Abnormal; Notable for the following components:   Glucose-Capillary 126 (*)    All other components within normal limits     IMAGES:   EKG: 03/19/2020 Rate 67 bpm    CV: Lexiscan (Walking with mod Bruce)Tetrofosmin Stress Test  03/30/2020: Nondiagnostic ECG stress. Converted to walking Lexiscan after 3:00 Bruce attempt due to dyspnea.  Myocardial perfusion is normal with mild diaphragmatic attenuation in inferior wall. minimal ischemia in this region cannot be excluded.  Overall LV systolic function is normal without regional wall motion abnormalities. Stress LV EF: 57%.  No previous exam available for comparison. Low risk.   Echo 12/24/2012 - Left ventricle: The cavity size was normal. Wall thickness  was increased in a pattern of mild LVH. There was mild  concentric hypertrophy. Systolic function was normal. The  estimated ejection fraction was in the range of 55% to  60%. Wall motion was normal; there were no regional wall  motion abnormalities. Left ventricular diastolic function  parameters were normal.  - Ventricular septum: Thickness was mildly increased.  - Mitral valve: Mild regurgitation.  - Left atrium: The atrium was mildly dilated.  - Atrial septum: No defect or patent foramen ovale was  identified.   Past Medical History:  Diagnosis Date  . Arthritis   . Broken back   .  CAD (coronary artery disease)    PCI & CABG  . CHF (congestive heart failure) ([place])   . Diverticulitis    mild - approx 2004  . Dysrhythmia    occasional arrythmias  . Family history of heart disease   . GERD (gastroesophageal reflux disease)   . Heart murmur    HX OF YEARS AGO   . History of nuclear stress test 06/30/2011   lexiscan; normal pattern of perfusion post-stress; low risk scan   . Hyperlipidemia   . Hypertension   . IgA nephropathy   . Irregular heart beat   . Myocardial infarction ([place])   . Pre-diabetes   . Rectal cancer  ([place]) 08/19/2019  . Systemic hypertension     Past Surgical History:  Procedure Laterality Date  . CARDIAC CATHETERIZATION  08/1995   PTCA of OM (Dr. Marella Chimes)  . CORONARY ARTERY BYPASS GRAFT  01/2000   x5 - LIMA to LAD, SVG to diagonal, sequential SVG to ramus & OM, SVG to PDA (Dr. Tharon Aquas Trigt)  . DIVERTING ILEOSTOMY N/A 06/10/2020   Procedure: DIVERTING LOOP ILEOSTOMY;  Surgeon: Leighton Ruff, MD;  Location: [facility];  Service: General;  Laterality: N/A;  . IR IMAGING GUIDED PORT INSERTION  09/04/2019  . LUNG LOBECTOMY     left upper lobe  . TRANSTHORACIC ECHOCARDIOGRAM  12/24/2012   EF 55-60%, mild LVH, mild conc hypertrophy; mild MR; LA mildly dilated  . XI ROBOTIC ASSISTED LOWER ANTERIOR RESECTION N/A 06/10/2020   Procedure: XI ROBOTIC ASSISTED LOWER ANTERIOR RESECTION, MOBILIZATION OF SPLENIC FLEXURE, RIGID PROCTOSCOPY;  Surgeon: Leighton Ruff, MD;  Location: [facility];  Service: General;  Laterality: N/A;    MEDICATIONS: . ascorbic acid (VITAMIN C) 500 MG tablet  . aspirin 81 MG EC tablet  . atenolol (TENORMIN) 50 MG tablet  . Calcium 600-200 MG-UNIT tablet  . cholecalciferol (VITAMIN D3) 25 MCG (1000 UNIT) tablet  . loperamide (IMODIUM) 2 MG capsule  . Loperamide-Simethicone 2-125 MG TABS  . Magnesium 400 MG TABS  . nitroGLYCERIN (NITROSTAT) 0.4 MG SL tablet  . omeprazole (PRILOSEC) 40 MG capsule  . polycarbophil (FIBERCON) 625 MG tablet  . rosuvastatin (CRESTOR) 20 MG tablet  . sodium chloride 0.9 %  . valsartan (DIOVAN) 160 MG tablet  . vitamin B-12 (CYANOCOBALAMIN) 50 MCG tablet  . vitamin E 45 MG (100 UNITS) capsule   No current facility-administered medications for this encounter.    Konrad Felix, PA-C [facility]Surgical [facility] [phone]

## 2020-10-05 ENCOUNTER — Other Ambulatory Visit: Payer: Self-pay

## 2020-10-05 ENCOUNTER — Ambulatory Visit (HOSPITAL_COMMUNITY)
Admission: RE | Admit: 2020-10-05 | Discharge: 2020-10-05 | Disposition: A | Discharge status: Home / Self Care | Payer: Medicare Other | Source: Ambulatory Visit | Attending: Oncology | Admitting: Oncology

## 2020-10-05 DIAGNOSIS — Z452 Encounter for adjustment and management of vascular access device: Secondary | ICD-10-CM | POA: Diagnosis not present

## 2020-10-05 DIAGNOSIS — Z85048 Personal history of other malignant neoplasm of rectum, rectosigmoid junction, and anus: Secondary | ICD-10-CM | POA: Diagnosis not present

## 2020-10-05 DIAGNOSIS — C20 Malignant neoplasm of rectum: Secondary | ICD-10-CM | POA: Diagnosis not present

## 2020-10-05 DIAGNOSIS — Z483 Aftercare following surgery for neoplasm: Secondary | ICD-10-CM | POA: Diagnosis not present

## 2020-10-05 DIAGNOSIS — N1832 Chronic kidney disease, stage 3b: Secondary | ICD-10-CM | POA: Diagnosis not present

## 2020-10-05 DIAGNOSIS — K8020 Calculus of gallbladder without cholecystitis without obstruction: Secondary | ICD-10-CM | POA: Diagnosis not present

## 2020-10-05 DIAGNOSIS — I2510 Atherosclerotic heart disease of native coronary artery without angina pectoris: Secondary | ICD-10-CM | POA: Diagnosis not present

## 2020-10-05 DIAGNOSIS — C19 Malignant neoplasm of rectosigmoid junction: Secondary | ICD-10-CM | POA: Diagnosis not present

## 2020-10-05 DIAGNOSIS — N200 Calculus of kidney: Secondary | ICD-10-CM | POA: Diagnosis not present

## 2020-10-05 DIAGNOSIS — I129 Hypertensive chronic kidney disease with stage 1 through stage 4 chronic kidney disease, or unspecified chronic kidney disease: Secondary | ICD-10-CM | POA: Diagnosis not present

## 2020-10-05 IMAGING — CT CT CHEST-ABD-PELV W/O CM
2 of 4 series · 13 of 36 positions shown, 15 images · non-contrast
Comparison: [DATE].

CLINICAL DATA: Colorectal cancer, assess treatment response in a
74-year-old male.

EXAM:
CT CHEST, ABDOMEN AND PELVIS WITHOUT CONTRAST
TECHNIQUE: Multidetector CT imaging of the chest, abdomen and pelvis was
performed following the standard protocol without IV contrast.

[Series 2: cap w/o · axial · non-contrast · 0.79mm/px · z∈[-482,+68]mm · 10 of 132 slices shown, 12 images]
[im 11/132  mediastinal]
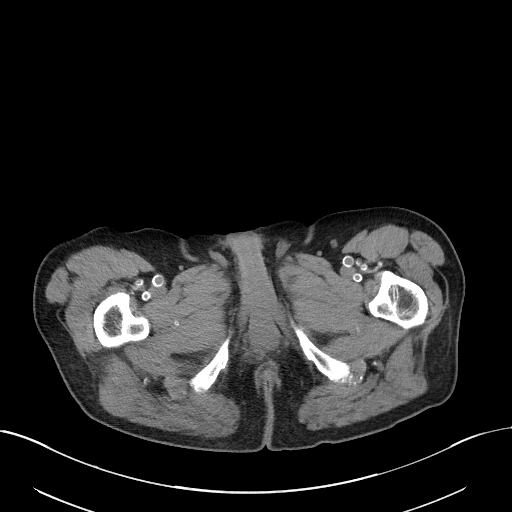
[im 11/132  bone]
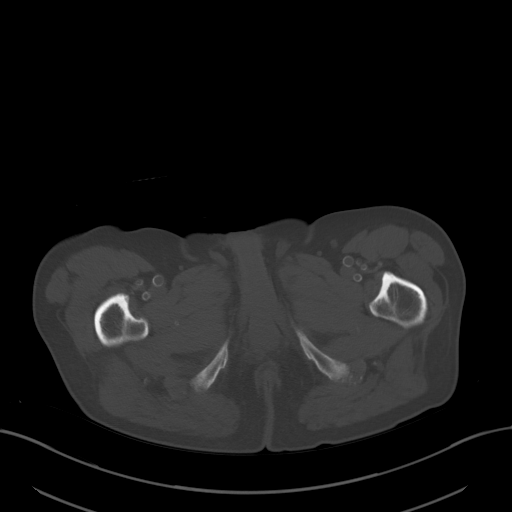
[im 22/132  mediastinal]
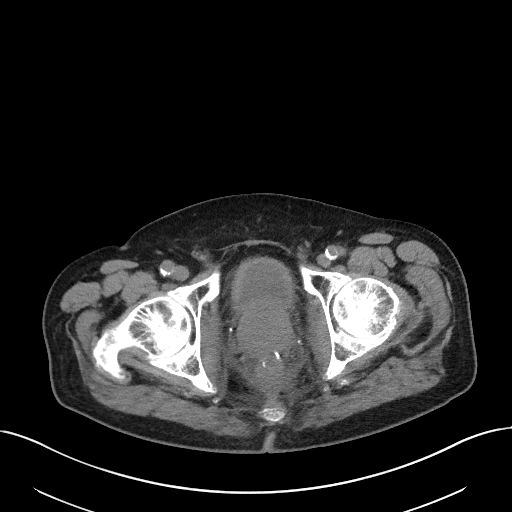
[im 33/132  mediastinal]
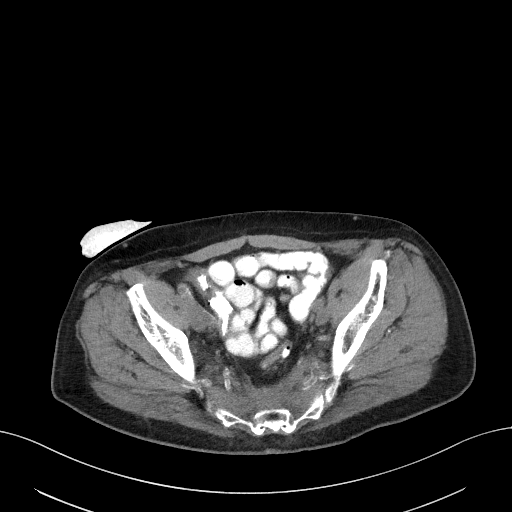
[im 44/132  mediastinal]
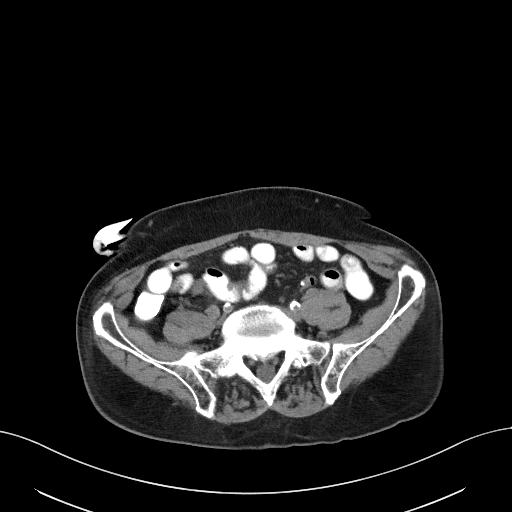
[im 55/132  mediastinal]
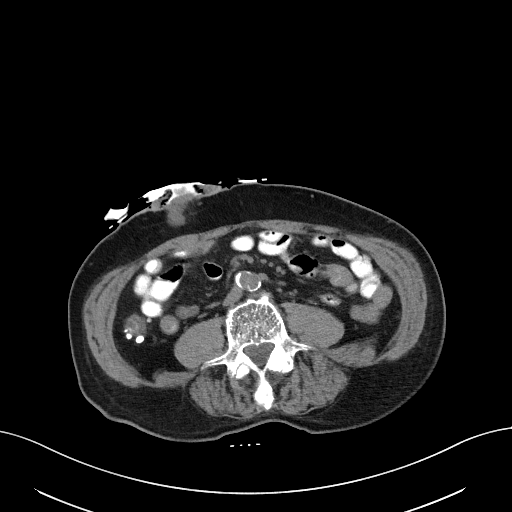
[im 77/132  mediastinal]
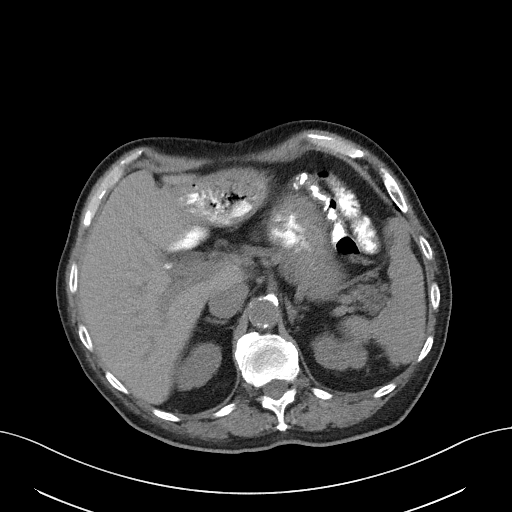
[im 88/132  mediastinal]
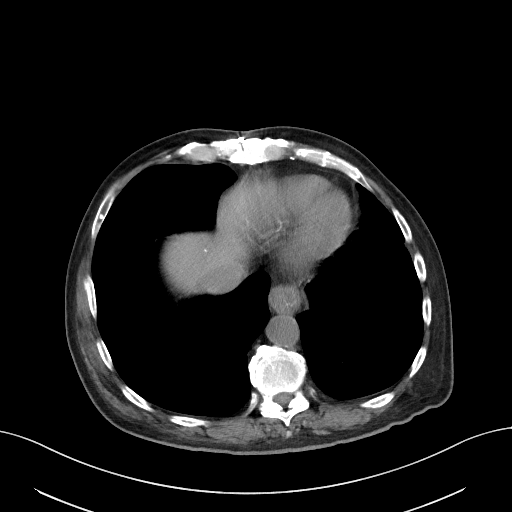
[im 99/132  mediastinal]
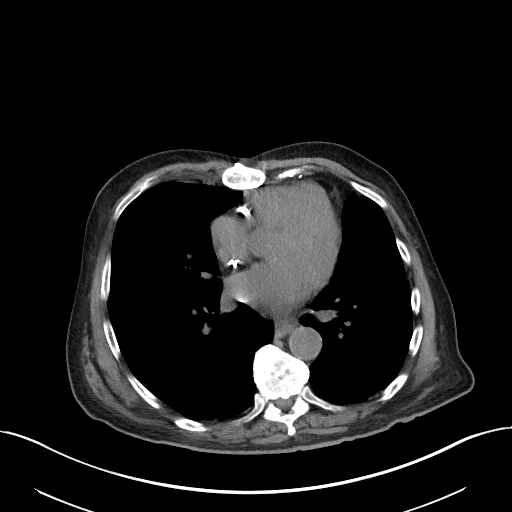
[im 110/132  mediastinal]
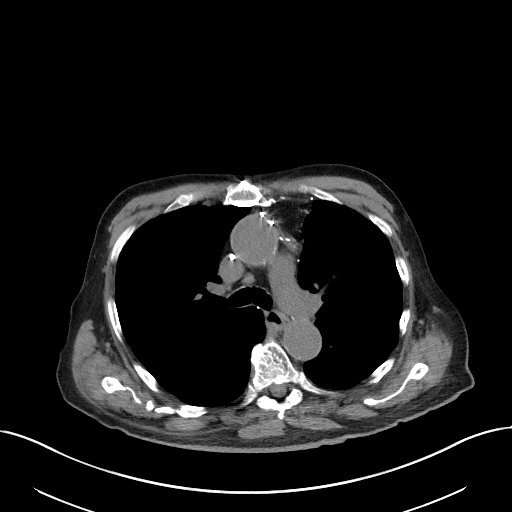
[im 110/132  bone]
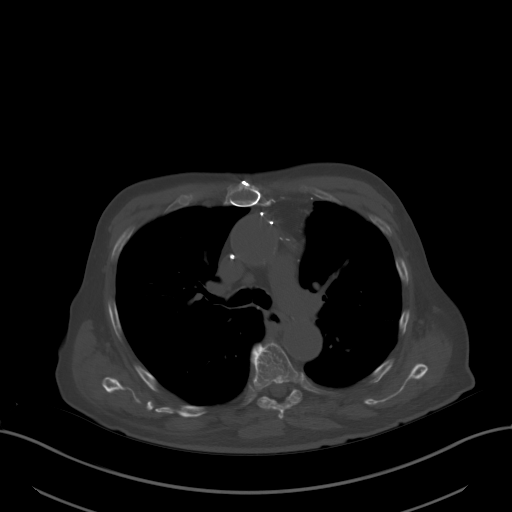
[im 121/132  mediastinal]
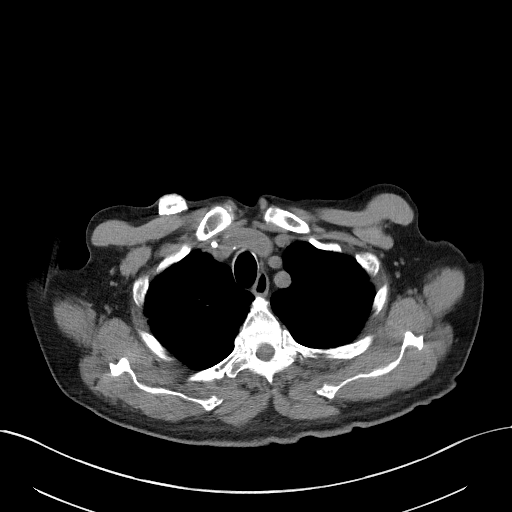

[Series 5: coronals · coronal · 0.80mm/px · 3 of 154 slices shown]
[im 31/154  mediastinal]
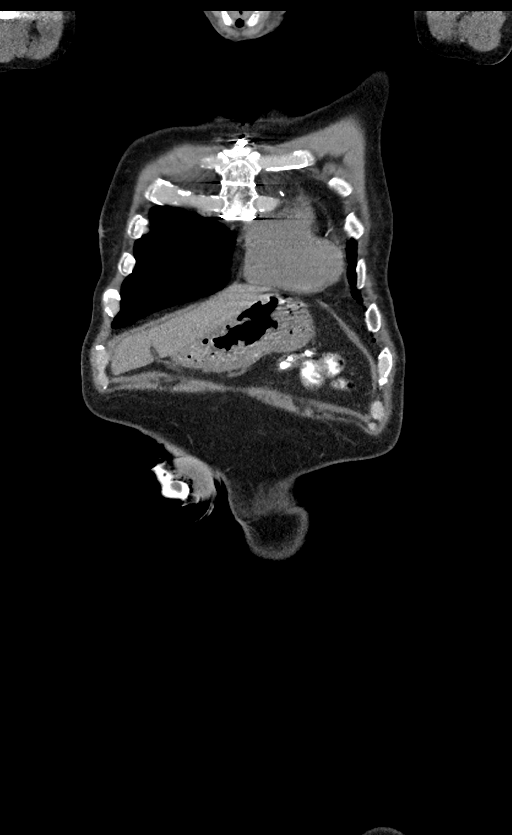
[im 62/154  mediastinal]
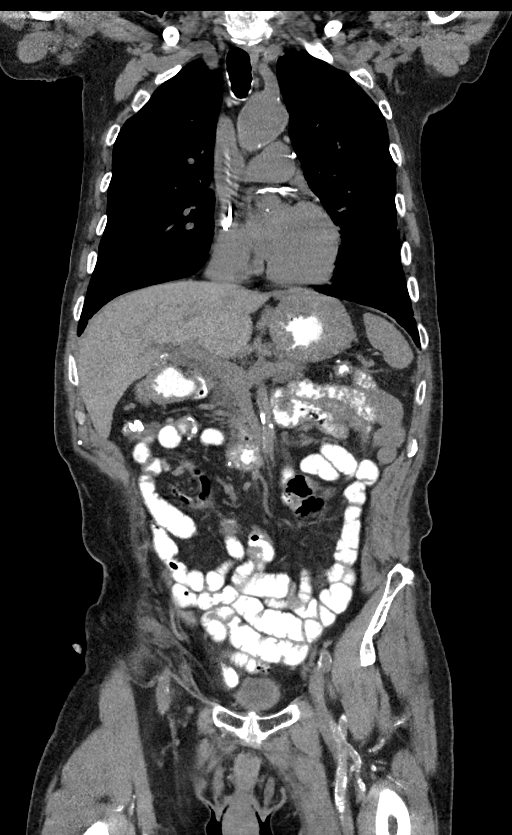
[im 92/154  mediastinal]
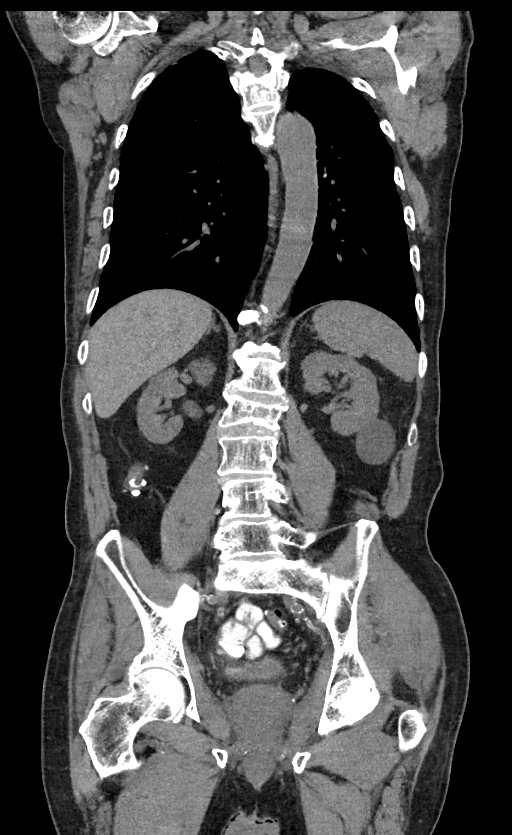

[13 of 36 positions shown; findings below may reference images not displayed]

FINDINGS: CT CHEST FINDINGS

Cardiovascular: RIGHT-sided Port-A-Cath terminates at the caval to
atrial junction. Heart size is normal without pericardial effusion.

Central pulmonary vasculature is normal caliber. Changes of median
sternotomy for CABG. Limited assessment of cardiovascular structures
given lack of intravenous contrast.

Mediastinum/Nodes: Thoracic inlet structures are normal.

No axillary lymphadenopathy. No mediastinal lymphadenopathy. No
gross hilar lymphadenopathy. Esophagus mildly patulous.

Lungs/Pleura: Biapical pleural and parenchymal scarring. Similar to
previous imaging. No consolidation. No pleural effusion. Airways are
patent. No suspicious pulmonary nodule. Mild scarring at the LEFT
lung base is similar to the prior study.

Musculoskeletal: No acute musculoskeletal process. Spinal
degenerative changes about the thoracic spine. Changes of median
sternotomy. See below for full musculoskeletal details.

CT ABDOMEN PELVIS FINDINGS

Hepatobiliary: Cholelithiasis without pericholecystic stranding.
Liver contour is smooth. No visible lesion on noncontrast imaging in
the liver.

Pancreas: Normal pancreatic contour. No focal lesion on noncontrast
imaging. No signs of peripancreatic inflammation.

Spleen: Normal spleen.

Adrenals/Urinary Tract: Adrenal glands are normal.

Cortical scarring of the kidneys bilaterally. Nephrolithiasis with 4
mm calculus in the lower pole LEFT kidney and scattered small
calculi elsewhere with no significant change. No hydronephrosis. No
ureteral calculi. Urinary bladder under distended limiting
assessment.

Stomach/Bowel: Diffuse gastric thickening suggested, grossly stable.
Small hiatal hernia.

No acute small bowel process. Residual contrast within the cecum
from previous water-soluble enema is quite dense. The signs of
diverting ostomy, loop type ostomy in the RIGHT lower quadrant. No
acute small bowel process.

Presacral stranding and soft tissue anterior to the sacrum measuring
13 mm with configuration that is most suggestive of prior radiation
with anastomotic changes near the low rectum. No gas in the soft
tissue.

Vascular/Lymphatic: Calcified atheromatous plaque of the abdominal
aorta. No aneurysmal dilation. The There is no gastrohepatic or
hepatoduodenal ligament lymphadenopathy. No retroperitoneal or
mesenteric lymphadenopathy.

No pelvic sidewall lymphadenopathy.

Reproductive: Prostate unremarkable by CT.

Other: No ascites.  No free air.

Musculoskeletal: No acute bone finding. No destructive bone process.
Spinal degenerative changes. L2 compression fracture with similar
appearance to prior imaging. Degenerative changes in the hips.
IMPRESSION: 1. Presacral stranding and soft tissue and mild stranding about
fascial planes in the pelvis presumably post treatment and
postsurgical changes, this will serve as a baseline for future
follow-up.
2. No evidence of metastatic disease in the chest, abdomen or
pelvis.
3. Diffuse gastric thickening suggested, grossly stable. Patient
reportedly has a history of gastropathy which may explain these
findings. Under distension also likely contributes to apparent
thickening.
4. Cholelithiasis.
5. Nephrolithiasis.
6. Aortic atherosclerosis.

Aortic Atherosclerosis ([HF]-[HF]).

## 2020-10-06 ENCOUNTER — Other Ambulatory Visit (HOSPITAL_COMMUNITY)
Admission: RE | Admit: 2020-10-06 | Discharge: 2020-10-06 | Disposition: A | Discharge status: Home / Self Care | Payer: Medicare Other | Source: Ambulatory Visit | Attending: General Surgery | Admitting: General Surgery

## 2020-10-06 DIAGNOSIS — D6959 Other secondary thrombocytopenia: Secondary | ICD-10-CM | POA: Diagnosis not present

## 2020-10-06 DIAGNOSIS — Z432 Encounter for attention to ileostomy: Secondary | ICD-10-CM | POA: Diagnosis not present

## 2020-10-06 DIAGNOSIS — K2270 Barrett's esophagus without dysplasia: Secondary | ICD-10-CM | POA: Diagnosis not present

## 2020-10-06 DIAGNOSIS — Z01812 Encounter for preprocedural laboratory examination: Secondary | ICD-10-CM | POA: Insufficient documentation

## 2020-10-06 DIAGNOSIS — Z20822 Contact with and (suspected) exposure to covid-19: Secondary | ICD-10-CM | POA: Insufficient documentation

## 2020-10-06 DIAGNOSIS — Z85048 Personal history of other malignant neoplasm of rectum, rectosigmoid junction, and anus: Secondary | ICD-10-CM | POA: Diagnosis not present

## 2020-10-06 DIAGNOSIS — C779 Secondary and unspecified malignant neoplasm of lymph node, unspecified: Secondary | ICD-10-CM | POA: Diagnosis not present

## 2020-10-06 DIAGNOSIS — N189 Chronic kidney disease, unspecified: Secondary | ICD-10-CM | POA: Diagnosis not present

## 2020-10-06 DIAGNOSIS — I252 Old myocardial infarction: Secondary | ICD-10-CM | POA: Diagnosis not present

## 2020-10-06 DIAGNOSIS — I2510 Atherosclerotic heart disease of native coronary artery without angina pectoris: Secondary | ICD-10-CM | POA: Diagnosis not present

## 2020-10-06 DIAGNOSIS — E785 Hyperlipidemia, unspecified: Secondary | ICD-10-CM | POA: Diagnosis not present

## 2020-10-06 DIAGNOSIS — K219 Gastro-esophageal reflux disease without esophagitis: Secondary | ICD-10-CM | POA: Diagnosis not present

## 2020-10-06 DIAGNOSIS — Z7982 Long term (current) use of aspirin: Secondary | ICD-10-CM | POA: Diagnosis not present

## 2020-10-06 DIAGNOSIS — N1832 Chronic kidney disease, stage 3b: Secondary | ICD-10-CM | POA: Diagnosis not present

## 2020-10-06 DIAGNOSIS — I129 Hypertensive chronic kidney disease with stage 1 through stage 4 chronic kidney disease, or unspecified chronic kidney disease: Secondary | ICD-10-CM | POA: Diagnosis not present

## 2020-10-06 DIAGNOSIS — R197 Diarrhea, unspecified: Secondary | ICD-10-CM | POA: Diagnosis not present

## 2020-10-06 DIAGNOSIS — C20 Malignant neoplasm of rectum: Secondary | ICD-10-CM | POA: Diagnosis not present

## 2020-10-06 DIAGNOSIS — Z79899 Other long term (current) drug therapy: Secondary | ICD-10-CM | POA: Diagnosis not present

## 2020-10-07 LAB — SARS CORONAVIRUS 2 (TAT 6-24 HRS): SARS Coronavirus 2: NEGATIVE

## 2020-10-08 ENCOUNTER — Other Ambulatory Visit: Payer: Self-pay

## 2020-10-08 ENCOUNTER — Other Ambulatory Visit: Payer: Medicare Other

## 2020-10-08 ENCOUNTER — Inpatient Hospital Stay: Payer: Medicare Other | Attending: Oncology

## 2020-10-08 DIAGNOSIS — C779 Secondary and unspecified malignant neoplasm of lymph node, unspecified: Secondary | ICD-10-CM | POA: Diagnosis not present

## 2020-10-08 DIAGNOSIS — D6959 Other secondary thrombocytopenia: Secondary | ICD-10-CM | POA: Insufficient documentation

## 2020-10-08 DIAGNOSIS — C20 Malignant neoplasm of rectum: Secondary | ICD-10-CM | POA: Diagnosis not present

## 2020-10-08 DIAGNOSIS — Z79899 Other long term (current) drug therapy: Secondary | ICD-10-CM | POA: Insufficient documentation

## 2020-10-08 DIAGNOSIS — N189 Chronic kidney disease, unspecified: Secondary | ICD-10-CM | POA: Insufficient documentation

## 2020-10-08 DIAGNOSIS — Z95828 Presence of other vascular implants and grafts: Secondary | ICD-10-CM

## 2020-10-08 DIAGNOSIS — R197 Diarrhea, unspecified: Secondary | ICD-10-CM | POA: Insufficient documentation

## 2020-10-08 LAB — CBC WITH DIFFERENTIAL (CANCER CENTER ONLY)
Abs Immature Granulocytes: 0.02 10*3/uL (ref 0.00–0.07)
Basophils Absolute: 0 10*3/uL (ref 0.0–0.1)
Basophils Relative: 1 %
Eosinophils Absolute: 0.2 10*3/uL (ref 0.0–0.5)
Eosinophils Relative: 4 %
HCT: 31.3 % — ABNORMAL LOW (ref 39.0–52.0)
Hemoglobin: 10.2 g/dL — ABNORMAL LOW (ref 13.0–17.0)
Immature Granulocytes: 1 %
Lymphocytes Relative: 18 %
Lymphs Abs: 0.7 10*3/uL (ref 0.7–4.0)
MCH: 31.1 pg (ref 26.0–34.0)
MCHC: 32.6 g/dL (ref 30.0–36.0)
MCV: 95.4 fL (ref 80.0–100.0)
Monocytes Absolute: 0.3 10*3/uL (ref 0.1–1.0)
Monocytes Relative: 8 %
Neutro Abs: 2.7 10*3/uL (ref 1.7–7.7)
Neutrophils Relative %: 68 %
Platelet Count: 99 10*3/uL — ABNORMAL LOW (ref 150–400)
RBC: 3.28 MIL/uL — ABNORMAL LOW (ref 4.22–5.81)
RDW: 15.1 % (ref 11.5–15.5)
WBC Count: 4 10*3/uL (ref 4.0–10.5)
nRBC: 0 % (ref 0.0–0.2)

## 2020-10-08 LAB — CMP (CANCER CENTER ONLY)
ALT: 13 U/L (ref 0–44)
AST: 25 U/L (ref 15–41)
Albumin: 3.5 g/dL (ref 3.5–5.0)
Alkaline Phosphatase: 67 U/L (ref 38–126)
Anion gap: 11 (ref 5–15)
BUN: 31 mg/dL — ABNORMAL HIGH (ref 8–23)
CO2: 23 mmol/L (ref 22–32)
Calcium: 8.2 mg/dL — ABNORMAL LOW (ref 8.9–10.3)
Chloride: 111 mmol/L (ref 98–111)
Creatinine: 1.65 mg/dL — ABNORMAL HIGH (ref 0.61–1.24)
GFR, Estimated: 43 mL/min — ABNORMAL LOW (ref >60)
Glucose, Bld: 104 mg/dL — ABNORMAL HIGH (ref 70–99)
Potassium: 3.9 mmol/L (ref 3.5–5.1)
Sodium: 145 mmol/L (ref 135–145)
Total Bilirubin: 0.4 mg/dL (ref 0.3–1.2)
Total Protein: 6.7 g/dL (ref 6.5–8.1)

## 2020-10-08 MED ORDER — HEPARIN SOD (PORK) LOCK FLUSH 100 UNIT/ML IV SOLN
500.0000 [IU] | Freq: Once | INTRAVENOUS | Status: AC
  Administered 2020-10-08: 500 [IU]
  Filled 2020-10-08: qty 5

## 2020-10-08 MED ORDER — SODIUM CHLORIDE 0.9% FLUSH
10.0000 mL | Freq: Once | INTRAVENOUS | Status: AC
Start: 2020-10-08 — End: 2020-10-08
  Administered 2020-10-08: 10 mL
  Filled 2020-10-08: qty 10

## 2020-10-08 NOTE — Anesthesia Preprocedure Evaluation (Addendum)
Anesthesia Evaluation  Patient identified by MRN, date of birth, ID band Patient awake    Reviewed: Allergy & Precautions, NPO status , Patient's Chart, lab work & pertinent test results  Airway Mallampati: II  TM Distance: >3 FB Neck ROM: Full    Dental no notable dental hx. (+) Missing, Dental Advisory Given,    Pulmonary  LULobectomy   Pulmonary exam normal breath sounds clear to auscultation       Cardiovascular hypertension, Pt. on medications + CAD, + Cardiac Stents, + CABG (2001) and +CHF  Normal cardiovascular exam Rhythm:Regular Rate:Normal  03/30/20 Myoview Nondiagnostic ECG stress. Converted to walking Lexiscan after 3:00 Bruce attempt due to dyspnea.  Myocardial perfusion is normal with mild diaphragmatic attenuation in inferior wall. minimal ischemia in this region cannot be excluded.  Overall LV systolic function is normal without regional wall motion abnormalities. Stress LV EF: 57%.  No previous exam available for comparison. Low risk.     Neuro/Psych negative neurological ROS  negative psych ROS   GI/Hepatic GERD  ,Rectal CA   Endo/Other  negative endocrine ROS  Renal/GU ARFRenal diseaseKindey CA Lab Results      Component                Value               Date                      CREATININE               1.65 (H)            10/08/2020                BUN                      31 (H)              10/08/2020                NA                       145                 10/08/2020                K                        3.9                 10/08/2020                CL                       111                 10/08/2020                CO2                      23                  10/08/2020                Musculoskeletal  (+) Arthritis ,   Abdominal   Peds  Hematology  (+) anemia , Lab Results      Component  Value               Date                      WBC                      4.0                  10/08/2020                HGB                      10.2 (L)            10/08/2020                HCT                      31.3 (L)            10/08/2020                MCV                      95.4                10/08/2020                PLT                      99 (L)              10/08/2020              Anesthesia Other Findings   Reproductive/Obstetrics                            Anesthesia Physical Anesthesia Plan  ASA: III  Anesthesia Plan: General   Post-op Pain Management:    Induction: Intravenous  PONV Risk Score and Plan: Treatment may vary due to age or medical condition, Ondansetron, Midazolam and Dexamethasone  Airway Management Planned: Oral ETT  Additional Equipment: None  Intra-op Plan:   Post-operative Plan: Extubation in OR  Informed Consent: I have reviewed the patients History and Physical, chart, labs and discussed the procedure including the risks, benefits and alternatives for the proposed anesthesia with the patient or authorized representative who has indicated his/her understanding and acceptance.     Dental advisory given  Plan Discussed with: CRNA  Anesthesia Plan Comments:        Anesthesia Quick Evaluation

## 2020-10-09 ENCOUNTER — Inpatient Hospital Stay (HOSPITAL_COMMUNITY)
Admission: RE | Admit: 2020-10-09 | Discharge: 2020-10-12 | DRG: 331 | Disposition: A | Discharge status: Home / Self Care | Payer: Medicare Other | Attending: General Surgery | Admitting: General Surgery

## 2020-10-09 ENCOUNTER — Other Ambulatory Visit: Payer: Self-pay

## 2020-10-09 ENCOUNTER — Encounter (HOSPITAL_COMMUNITY): Payer: Self-pay | Admitting: General Surgery

## 2020-10-09 ENCOUNTER — Encounter (HOSPITAL_COMMUNITY)
Admission: RE | Disposition: A | Discharge status: Home / Self Care | Payer: Self-pay | Source: Home / Self Care | Attending: General Surgery

## 2020-10-09 ENCOUNTER — Inpatient Hospital Stay (HOSPITAL_COMMUNITY): Payer: Medicare Other | Admitting: Physician Assistant

## 2020-10-09 ENCOUNTER — Inpatient Hospital Stay (HOSPITAL_COMMUNITY): Payer: Medicare Other | Admitting: Certified Registered"

## 2020-10-09 DIAGNOSIS — I10 Essential (primary) hypertension: Secondary | ICD-10-CM | POA: Diagnosis present

## 2020-10-09 DIAGNOSIS — I252 Old myocardial infarction: Secondary | ICD-10-CM | POA: Diagnosis not present

## 2020-10-09 DIAGNOSIS — K219 Gastro-esophageal reflux disease without esophagitis: Secondary | ICD-10-CM | POA: Diagnosis present

## 2020-10-09 DIAGNOSIS — N1832 Chronic kidney disease, stage 3b: Secondary | ICD-10-CM | POA: Diagnosis not present

## 2020-10-09 DIAGNOSIS — Z20822 Contact with and (suspected) exposure to covid-19: Secondary | ICD-10-CM | POA: Diagnosis present

## 2020-10-09 DIAGNOSIS — K2270 Barrett's esophagus without dysplasia: Secondary | ICD-10-CM | POA: Diagnosis not present

## 2020-10-09 DIAGNOSIS — Z932 Ileostomy status: Secondary | ICD-10-CM

## 2020-10-09 DIAGNOSIS — Z85048 Personal history of other malignant neoplasm of rectum, rectosigmoid junction, and anus: Secondary | ICD-10-CM | POA: Diagnosis not present

## 2020-10-09 DIAGNOSIS — I2510 Atherosclerotic heart disease of native coronary artery without angina pectoris: Secondary | ICD-10-CM | POA: Diagnosis present

## 2020-10-09 DIAGNOSIS — Z432 Encounter for attention to ileostomy: Secondary | ICD-10-CM | POA: Diagnosis not present

## 2020-10-09 DIAGNOSIS — Z79899 Other long term (current) drug therapy: Secondary | ICD-10-CM

## 2020-10-09 DIAGNOSIS — Z7982 Long term (current) use of aspirin: Secondary | ICD-10-CM | POA: Diagnosis not present

## 2020-10-09 DIAGNOSIS — C20 Malignant neoplasm of rectum: Secondary | ICD-10-CM | POA: Diagnosis present

## 2020-10-09 DIAGNOSIS — Z95828 Presence of other vascular implants and grafts: Secondary | ICD-10-CM

## 2020-10-09 DIAGNOSIS — I129 Hypertensive chronic kidney disease with stage 1 through stage 4 chronic kidney disease, or unspecified chronic kidney disease: Secondary | ICD-10-CM | POA: Diagnosis not present

## 2020-10-09 DIAGNOSIS — C7800 Secondary malignant neoplasm of unspecified lung: Secondary | ICD-10-CM | POA: Diagnosis present

## 2020-10-09 HISTORY — PX: ILEOSTOMY CLOSURE: SHX1784

## 2020-10-09 LAB — GLUCOSE, CAPILLARY: Glucose-Capillary: 111 mg/dL — ABNORMAL HIGH (ref 70–99)

## 2020-10-09 SURGERY — CLOSURE, ILEOSTOMY
Anesthesia: General | Site: Abdomen

## 2020-10-09 MED ORDER — SODIUM CHLORIDE 0.9 % IV SOLN
2.0000 g | Freq: Two times a day (BID) | INTRAVENOUS | Status: AC
  Administered 2020-10-09: 2 g via INTRAVENOUS
  Filled 2020-10-09: qty 2

## 2020-10-09 MED ORDER — PROPOFOL 10 MG/ML IV BOLUS
INTRAVENOUS | Status: DC | PRN
  Administered 2020-10-09: 110 mg via INTRAVENOUS

## 2020-10-09 MED ORDER — LACTATED RINGERS IV SOLN: INTRAVENOUS | Status: DC

## 2020-10-09 MED ORDER — GABAPENTIN 300 MG PO CAPS
300.0000 mg | ORAL_CAPSULE | Freq: Two times a day (BID) | ORAL | Status: DC
  Administered 2020-10-09 – 2020-10-12 (×7): 300 mg via ORAL
  Filled 2020-10-09 (×7): qty 1

## 2020-10-09 MED ORDER — ALUM & MAG HYDROXIDE-SIMETH 200-200-20 MG/5ML PO SUSP
30.0000 mL | Freq: Four times a day (QID) | ORAL | Status: DC | PRN
  Administered 2020-10-11: 30 mL via ORAL
  Filled 2020-10-09: qty 30

## 2020-10-09 MED ORDER — ACETAMINOPHEN 500 MG PO TABS
1000.0000 mg | ORAL_TABLET | ORAL | Status: AC
  Administered 2020-10-09: 1000 mg via ORAL
  Filled 2020-10-09: qty 2

## 2020-10-09 MED ORDER — SACCHAROMYCES BOULARDII 250 MG PO CAPS
250.0000 mg | ORAL_CAPSULE | Freq: Two times a day (BID) | ORAL | Status: DC
  Administered 2020-10-09 – 2020-10-12 (×7): 250 mg via ORAL
  Filled 2020-10-09 (×7): qty 1

## 2020-10-09 MED ORDER — ENOXAPARIN SODIUM 40 MG/0.4ML IJ SOSY
40.0000 mg | PREFILLED_SYRINGE | INTRAMUSCULAR | Status: DC
  Administered 2020-10-10 – 2020-10-12 (×3): 40 mg via SUBCUTANEOUS
  Filled 2020-10-09 (×3): qty 0.4

## 2020-10-09 MED ORDER — AMISULPRIDE (ANTIEMETIC) 5 MG/2ML IV SOLN: 10.0000 mg | Freq: Once | INTRAVENOUS | Status: DC | PRN

## 2020-10-09 MED ORDER — OXYCODONE HCL 5 MG PO TABS: 5.0000 mg | ORAL_TABLET | Freq: Once | ORAL | Status: DC | PRN

## 2020-10-09 MED ORDER — MIDAZOLAM HCL 2 MG/2ML IJ SOLN
INTRAMUSCULAR | Status: DC | PRN
  Administered 2020-10-09: 2 mg via INTRAVENOUS

## 2020-10-09 MED ORDER — DIPHENHYDRAMINE HCL 50 MG/ML IJ SOLN: 12.5000 mg | Freq: Four times a day (QID) | INTRAMUSCULAR | Status: DC | PRN

## 2020-10-09 MED ORDER — ONDANSETRON HCL 4 MG/2ML IJ SOLN
4.0000 mg | Freq: Once | INTRAMUSCULAR | Status: AC | PRN
  Administered 2020-10-09: 4 mg via INTRAVENOUS

## 2020-10-09 MED ORDER — ACETAMINOPHEN 500 MG PO TABS
1000.0000 mg | ORAL_TABLET | Freq: Four times a day (QID) | ORAL | Status: DC
  Administered 2020-10-09 – 2020-10-12 (×10): 1000 mg via ORAL
  Filled 2020-10-09 (×11): qty 2

## 2020-10-09 MED ORDER — HYDROMORPHONE HCL 1 MG/ML IJ SOLN
0.5000 mg | INTRAMUSCULAR | Status: DC | PRN
  Filled 2020-10-09: qty 0.5

## 2020-10-09 MED ORDER — SIMETHICONE 80 MG PO CHEW: 40.0000 mg | CHEWABLE_TABLET | Freq: Four times a day (QID) | ORAL | Status: DC | PRN

## 2020-10-09 MED ORDER — LIDOCAINE 2% (20 MG/ML) 5 ML SYRINGE
INTRAMUSCULAR | Status: DC | PRN
  Administered 2020-10-09: 80 mg via INTRAVENOUS

## 2020-10-09 MED ORDER — CHLORHEXIDINE GLUCONATE 0.12 % MT SOLN
15.0000 mL | Freq: Once | OROMUCOSAL | Status: AC
  Administered 2020-10-09: 15 mL via OROMUCOSAL

## 2020-10-09 MED ORDER — DEXAMETHASONE SODIUM PHOSPHATE 10 MG/ML IJ SOLN
INTRAMUSCULAR | Status: DC | PRN
  Administered 2020-10-09: 4 mg via INTRAVENOUS

## 2020-10-09 MED ORDER — MIDAZOLAM HCL 2 MG/2ML IJ SOLN
INTRAMUSCULAR | Status: AC
  Filled 2020-10-09: qty 2

## 2020-10-09 MED ORDER — ONDANSETRON HCL 4 MG/2ML IJ SOLN
INTRAMUSCULAR | Status: DC | PRN
  Administered 2020-10-09: 4 mg via INTRAVENOUS

## 2020-10-09 MED ORDER — SODIUM CHLORIDE 0.9% FLUSH
10.0000 mL | INTRAVENOUS | Status: DC | PRN
Start: 2020-10-09 — End: 2020-10-12

## 2020-10-09 MED ORDER — OXYCODONE HCL 5 MG PO TABS
5.0000 mg | ORAL_TABLET | ORAL | Status: DC | PRN
  Administered 2020-10-11: 5 mg via ORAL
  Filled 2020-10-09: qty 1

## 2020-10-09 MED ORDER — ONDANSETRON HCL 4 MG PO TABS: 4.0000 mg | ORAL_TABLET | Freq: Four times a day (QID) | ORAL | Status: DC | PRN

## 2020-10-09 MED ORDER — ACETAMINOPHEN 10 MG/ML IV SOLN: 1000.0000 mg | Freq: Once | INTRAVENOUS | Status: DC | PRN

## 2020-10-09 MED ORDER — ALVIMOPAN 12 MG PO CAPS: 12.0000 mg | ORAL_CAPSULE | Freq: Two times a day (BID) | ORAL | Status: DC

## 2020-10-09 MED ORDER — BUPIVACAINE LIPOSOME 1.3 % IJ SUSP
20.0000 mL | Freq: Once | INTRAMUSCULAR | Status: DC
  Filled 2020-10-09: qty 20

## 2020-10-09 MED ORDER — ONDANSETRON HCL 4 MG/2ML IJ SOLN
INTRAMUSCULAR | Status: AC
  Filled 2020-10-09: qty 2

## 2020-10-09 MED ORDER — EPHEDRINE SULFATE-NACL 50-0.9 MG/10ML-% IV SOSY
PREFILLED_SYRINGE | INTRAVENOUS | Status: DC | PRN
  Administered 2020-10-09: 10 mg via INTRAVENOUS

## 2020-10-09 MED ORDER — PROCHLORPERAZINE EDISYLATE 10 MG/2ML IJ SOLN
5.0000 mg | INTRAMUSCULAR | Status: DC | PRN
  Administered 2020-10-09: 5 mg via INTRAVENOUS
  Filled 2020-10-09: qty 2

## 2020-10-09 MED ORDER — BUPIVACAINE-EPINEPHRINE (PF) 0.25% -1:200000 IJ SOLN
INTRAMUSCULAR | Status: AC
  Filled 2020-10-09: qty 60

## 2020-10-09 MED ORDER — EPHEDRINE 5 MG/ML INJ
INTRAVENOUS | Status: AC
  Filled 2020-10-09: qty 10

## 2020-10-09 MED ORDER — ROCURONIUM BROMIDE 10 MG/ML (PF) SYRINGE
PREFILLED_SYRINGE | INTRAVENOUS | Status: AC
  Filled 2020-10-09: qty 10

## 2020-10-09 MED ORDER — PROPOFOL 10 MG/ML IV BOLUS
INTRAVENOUS | Status: AC
  Filled 2020-10-09: qty 20

## 2020-10-09 MED ORDER — CHLORHEXIDINE GLUCONATE CLOTH 2 % EX PADS
6.0000 | MEDICATED_PAD | Freq: Every day | CUTANEOUS | Status: DC
  Administered 2020-10-11 – 2020-10-12 (×2): 6 via TOPICAL

## 2020-10-09 MED ORDER — ENSURE SURGERY PO LIQD
237.0000 mL | Freq: Two times a day (BID) | ORAL | Status: DC
  Administered 2020-10-12: 237 mL via ORAL

## 2020-10-09 MED ORDER — HYDROMORPHONE HCL 1 MG/ML IJ SOLN: 0.2500 mg | INTRAMUSCULAR | Status: DC | PRN

## 2020-10-09 MED ORDER — FENTANYL CITRATE (PF) 100 MCG/2ML IJ SOLN
INTRAMUSCULAR | Status: DC | PRN
  Administered 2020-10-09 (×2): 50 ug via INTRAVENOUS

## 2020-10-09 MED ORDER — ROCURONIUM BROMIDE 10 MG/ML (PF) SYRINGE
PREFILLED_SYRINGE | INTRAVENOUS | Status: DC | PRN
  Administered 2020-10-09: 40 mg via INTRAVENOUS

## 2020-10-09 MED ORDER — BUPIVACAINE-EPINEPHRINE (PF) 0.25% -1:200000 IJ SOLN
INTRAMUSCULAR | Status: DC | PRN
  Administered 2020-10-09: 30 mL

## 2020-10-09 MED ORDER — FENTANYL CITRATE (PF) 100 MCG/2ML IJ SOLN
INTRAMUSCULAR | Status: AC
  Filled 2020-10-09: qty 2

## 2020-10-09 MED ORDER — LIDOCAINE 2% (20 MG/ML) 5 ML SYRINGE
INTRAMUSCULAR | Status: AC
  Filled 2020-10-09: qty 5

## 2020-10-09 MED ORDER — 0.9 % SODIUM CHLORIDE (POUR BTL) OPTIME
TOPICAL | Status: DC | PRN
  Administered 2020-10-09: 2000 mL

## 2020-10-09 MED ORDER — ONDANSETRON HCL 4 MG/2ML IJ SOLN: 4.0000 mg | Freq: Four times a day (QID) | INTRAMUSCULAR | Status: DC | PRN

## 2020-10-09 MED ORDER — SODIUM CHLORIDE 0.9 % IV SOLN
2.0000 g | INTRAVENOUS | Status: AC
  Administered 2020-10-09: 2 g via INTRAVENOUS
  Filled 2020-10-09: qty 2

## 2020-10-09 MED ORDER — DIPHENHYDRAMINE HCL 12.5 MG/5ML PO ELIX
12.5000 mg | ORAL_SOLUTION | Freq: Four times a day (QID) | ORAL | Status: DC | PRN
Start: 2020-10-09 — End: 2020-10-12

## 2020-10-09 MED ORDER — DEXAMETHASONE SODIUM PHOSPHATE 10 MG/ML IJ SOLN
INTRAMUSCULAR | Status: AC
  Filled 2020-10-09: qty 1

## 2020-10-09 MED ORDER — OXYCODONE HCL 5 MG/5ML PO SOLN
5.0000 mg | Freq: Once | ORAL | Status: DC | PRN
Start: 2020-10-09 — End: 2020-10-09

## 2020-10-09 MED ORDER — BUPIVACAINE LIPOSOME 1.3 % IJ SUSP
INTRAMUSCULAR | Status: DC | PRN
  Administered 2020-10-09: 20 mL

## 2020-10-09 MED ORDER — ORAL CARE MOUTH RINSE: 15.0000 mL | Freq: Once | OROMUCOSAL | Status: AC

## 2020-10-09 MED ORDER — KCL IN DEXTROSE-NACL 20-5-0.45 MEQ/L-%-% IV SOLN
INTRAVENOUS | Status: DC
  Filled 2020-10-09 (×4): qty 1000

## 2020-10-09 MED ORDER — SUGAMMADEX SODIUM 200 MG/2ML IV SOLN
INTRAVENOUS | Status: DC | PRN
  Administered 2020-10-09: 130 mg via INTRAVENOUS

## 2020-10-09 MED ORDER — SODIUM CHLORIDE 0.9% FLUSH
10.0000 mL | Freq: Two times a day (BID) | INTRAVENOUS | Status: DC
Start: 2020-10-10 — End: 2020-10-12

## 2020-10-09 SURGICAL SUPPLY — 45 items
BLADE HEX COATED 2.75 (ELECTRODE) ×2 IMPLANT
CHLORAPREP W/TINT 26 (MISCELLANEOUS) ×2 IMPLANT
COVER MAYO STAND STRL (DRAPES) ×2 IMPLANT
COVER WAND RF STERILE (DRAPES) IMPLANT
DRAPE LAPAROSCOPIC ABDOMINAL (DRAPES) ×2 IMPLANT
DRAPE UTILITY XL STRL (DRAPES) IMPLANT
DRAPE WARM FLUID 44X44 (DRAPES) ×2 IMPLANT
DRSG OPSITE POSTOP 3X4 (GAUZE/BANDAGES/DRESSINGS) ×2 IMPLANT
DRSG OPSITE POSTOP 4X10 (GAUZE/BANDAGES/DRESSINGS) IMPLANT
DRSG OPSITE POSTOP 4X6 (GAUZE/BANDAGES/DRESSINGS) IMPLANT
DRSG OPSITE POSTOP 4X8 (GAUZE/BANDAGES/DRESSINGS) IMPLANT
DRSG TELFA 3X8 NADH (GAUZE/BANDAGES/DRESSINGS) ×2 IMPLANT
ELECT REM PT RETURN 15FT ADLT (MISCELLANEOUS) ×2 IMPLANT
GAUZE SPONGE 4X4 12PLY STRL (GAUZE/BANDAGES/DRESSINGS) ×2 IMPLANT
GLOVE SURG ENC MOIS LTX SZ6.5 (GLOVE) ×4 IMPLANT
GLOVE SURG UNDER POLY LF SZ7 (GLOVE) ×2 IMPLANT
GOWN STRL REUS W/TWL XL LVL3 (GOWN DISPOSABLE) ×4 IMPLANT
HANDLE SUCTION POOLE (INSTRUMENTS) ×1 IMPLANT
HOLDER FOLEY CATH W/STRAP (MISCELLANEOUS) IMPLANT
KIT BASIN OR (CUSTOM PROCEDURE TRAY) ×2 IMPLANT
KIT TURNOVER KIT A (KITS) ×2 IMPLANT
MANIFOLD NEPTUNE II (INSTRUMENTS) ×2 IMPLANT
PACK GENERAL/GYN (CUSTOM PROCEDURE TRAY) ×2 IMPLANT
PENCIL SMOKE EVACUATOR (MISCELLANEOUS) IMPLANT
RELOAD PROXIMATE 75MM BLUE (ENDOMECHANICALS) ×4 IMPLANT
SPONGE LAP 18X18 RF (DISPOSABLE) IMPLANT
STAPLER GUN LINEAR PROX 60 (STAPLE) ×2 IMPLANT
STAPLER PROXIMATE 75MM BLUE (STAPLE) ×2 IMPLANT
SUCTION POOLE HANDLE (INSTRUMENTS) ×2 IMPLANT
SUT NOVA NAB DX-16 0-1 5-0 T12 (SUTURE) ×2 IMPLANT
SUT NOVA NAB GS-21 0 18 T12 DT (SUTURE) IMPLANT
SUT PROLENE 2 0 BLUE (SUTURE) IMPLANT
SUT SILK 2 0 (SUTURE) ×2
SUT SILK 2 0 SH CR/8 (SUTURE) ×2 IMPLANT
SUT SILK 2-0 18XBRD TIE 12 (SUTURE) ×1 IMPLANT
SUT SILK 3 0 (SUTURE) ×2
SUT SILK 3 0 SH CR/8 (SUTURE) ×2 IMPLANT
SUT SILK 3-0 18XBRD TIE 12 (SUTURE) ×1 IMPLANT
SUT VIC AB 2-0 SH 18 (SUTURE) IMPLANT
SUT VIC AB 2-0 SH 27 (SUTURE) ×4
SUT VIC AB 2-0 SH 27X BRD (SUTURE) ×2 IMPLANT
SUT VIC AB 4-0 PS2 18 (SUTURE) ×2 IMPLANT
TOWEL OR 17X26 10 PK STRL BLUE (TOWEL DISPOSABLE) ×4 IMPLANT
TOWEL OR NON WOVEN STRL DISP B (DISPOSABLE) ×4 IMPLANT
YANKAUER SUCT BULB TIP NO VENT (SUCTIONS) ×2 IMPLANT

## 2020-10-09 NOTE — Anesthesia Procedure Notes (Signed)
Procedure Name: Intubation Date/Time: 10/09/2020 7:41 AM Performed by: Niel Hummer, CRNA Pre-anesthesia Checklist: Patient identified, Emergency Drugs available, Suction available and Patient being monitored Patient Re-evaluated:Patient Re-evaluated prior to induction Oxygen Delivery Method: Circle system utilized Preoxygenation: Pre-oxygenation with 100% oxygen Induction Type: IV induction Ventilation: Mask ventilation without difficulty Laryngoscope Size: Mac and 4 Grade View: Grade I Tube type: Oral Tube size: 7.5 mm Number of attempts: 1 Airway Equipment and Method: Stylet Placement Confirmation: ETT inserted through vocal cords under direct vision,  positive ETCO2 and breath sounds checked- equal and bilateral Secured at: 22 cm Tube secured with: Tape Dental Injury: Teeth and Oropharynx as per pre-operative assessment

## 2020-10-09 NOTE — Op Note (Signed)
10/09/2020  9:16 AM  PATIENT:  Jared White  75 y.o. male  Patient Care Team: [name], MD as PCP - General (Family Medicine) Delrae Rend, MD as Consulting Physician (Endocrinology) Pa, [place] as Attending Physician (Neurosurgery)  PRE-OPERATIVE DIAGNOSIS:  RECTAL CANCER, ILEOSTOMY PRESENT  POST-OPERATIVE DIAGNOSIS:  RECTAL CANCER, ILEOSTOMY PRESENT  PROCEDURE:   LOOP ILEOSTOMY REVERSAL    Surgeon(s): Leighton Ruff, MD  ASSISTANT: Dr Wynelle Link   ANESTHESIA:   general  EBL:  Total I/O In: 100 [IV Piggyback:100] Out: -   DRAINS: none   SPECIMEN:  Source of Specimen:  ileostomy  DISPOSITION OF SPECIMEN:  PATHOLOGY  COUNTS:  YES  PLAN OF CARE: Discharge to home after PACU  PATIENT DISPOSITION:  PACU - hemodynamically stable.  INDICATION: 75 y.o. M who presents to the OR for ileostomy reversal after LAR and coloanal anastomosis   OR FINDINGS: ileostomy in place  DESCRIPTION: the patient was identified in the preoperative holding area and taken to the OR where they were laid supine on the operating room table.  General anesthesia was induced without difficulty. SCDs were also noted to be in place prior to the initiation of anesthesia.  The patient was then prepped and draped in the usual sterile fashion.   A surgical timeout was performed indicating the correct patient, procedure, positioning and need for preoperative antibiotics.   We began by making an incision around the mucocutaneous junction of the ileostomy using electrocautery.  Dissection was carried down through the subcutaneous tissues using blunt dissection and electrocautery.  The interface between the fascia and the small bowel was dissected free using blunt dissection and Metzenbaum scissors.  Once the entire ileostomy was freed from the abdomen it was elevated and to blue load stapler fires were used to transect the proximal and distal small bowel.  The  mesentery was divided using cut and clamp technique with 2-0 silk sutures.  An anastomosis was created using the 75 mm blue load stapler.  Common enterotomy was closed using a 60 mm blue load TA stapler.  Mesenteric defect was closed using interrupted 2-0 silk sutures.  An antitension suture was placed in the crotch of the anastomosis.  The corner of the staple line was imbricated using an interrupted 3-0 silk suture.  Anastomosis was patent.  This was then placed back into the abdomen.  There was no sign of injury or bleeding within the abdomen.  We then switched to clean gloves, the fascia was closed using interrupted #1 Novafil sutures.  The subcutaneous tissue was reapproximated using a 2-0 Vicryl pursestring suture.  A Telfa wick was placed in the middle.  Local anesthesia with Exparel was placed into the fascial layer and skin layer for added postoperative pain control.  Another 2-0 Vicryl pursestring suture was placed in the dermal layer and tied around the Telfa wick.  A sterile dressing was applied.  The patient was then awakened from anesthesia and sent to the postanesthesia care unit in stable condition.  All counts were correct per operating room staff.  I was personally present during the key and critical portions of this procedure and immediately available throughout the entire procedure, as documented in my operative note.

## 2020-10-09 NOTE — Transfer of Care (Signed)
Immediate Anesthesia Transfer of Care Note  Patient: Jared White  Procedure(s) Performed: LOOP ILEOSTOMY REVERSAL (N/A Abdomen)  Patient Location: PACU  Anesthesia Type:General  Level of Consciousness: awake, alert  and oriented  Airway & Oxygen Therapy: Patient Spontanous Breathing and Patient connected to face mask oxygen  Post-op Assessment: Report given to RN, Post -op Vital signs reviewed and stable and Patient moving all extremities X 4  Post vital signs: Reviewed and stable  Last Vitals:  Vitals Value Taken Time  BP 142/71 10/09/20 0925  Temp    Pulse 73 10/09/20 0926  Resp 23 10/09/20 0926  SpO2 100 % 10/09/20 0926  Vitals shown include unvalidated device data.  Last Pain:  Vitals:   10/09/20 0628  TempSrc: Oral  PainSc:          Complications: No complications documented.

## 2020-10-09 NOTE — H&P (Signed)
  The patient is a 75 year old male who presents with colorectal cancer.75 year old male who presents to the office with a diagnosis of rectal cancer seen on colonoscopy in March. He underwent total neoadjuvant treatment with Dr. Alen Blew. He underwent LAR and diverting ostomy and has now healed his anastomosis.  GG enema is normal.   Problem List/Past Medical  RECTAL CANCER (C20)  Past Surgical History Bypass Surgery for Poor Blood Flow to Legs Colon Polyp Removal - Colonoscopy  Allergies  No Known Drug Allergies [01/30/2020]: Allergies Reconciled  Medication History  Atenolol (50MG  Tablet, Oral) Active. Famotidine (20MG  Tablet, Oral) Active. Nitroglycerin (0.4MG  Tab Sublingual, Sublingual) Active. Rosuvastatin Calcium (20MG  Tablet, Oral) Active. Valsartan (160MG  Tablet, Oral) Active. Medications Reconciled Lidocaine-Prilocaine (2.5-2.5% Cream, External) Active. Prochlorperazine Maleate (10MG  Tablet, Oral) Active. Omeprazole (40MG  Capsule DR, Oral) Active. Aspirin (81MG  Tablet Chewable, Oral) Active. Cyanocobalamin (1000MCG Tablet Chewable, Oral) Active.  Other Problems Leighton Ruff, MD; 98/03/9146 9:20 AM) Back Pain Chronic Renal Failure Syndrome Congestive Heart Failure Gastroesophageal Reflux Disease Kidney Stone Myocardial infarction Rectal Cancer Vascular Disease   BP 125/71   Pulse (!) 53   Temp 98.3 F (36.8 C) (Oral)   Resp 16   Ht 5\' 8"  (1.727 m)   Wt 61.2 kg   SpO2 98%   BMI 20.53 kg/m     Physical Exam   General Mental Status-Alert. General Appearance-Cooperative. CV: RRR Lungs: CTA Abdomen Palpation/Percussion Palpation and Percussion of the abdomen reveal - Soft.    Assessment & Plan  RECTAL CANCER (C20) Impression: 75 year old male with a distal rectal cancer s/p LAR and diverting ileostomy.  He has healed from his surgery.  GG enema shows no leak or stricture.  I have recommended ileostomy  closure.  Risks include bleeding, infection, anastomotic leak, damage to adjacent structures and hernia.  I believe he understands this and wishes to proceed.

## 2020-10-09 NOTE — Anesthesia Postprocedure Evaluation (Signed)
Anesthesia Post Note  Patient: Jared White  Procedure(s) Performed: LOOP ILEOSTOMY REVERSAL (N/A Abdomen)     Patient location during evaluation: PACU Anesthesia Type: General Level of consciousness: awake and alert Pain management: pain level controlled Vital Signs Assessment: post-procedure vital signs reviewed and stable Respiratory status: spontaneous breathing, nonlabored ventilation, respiratory function stable and patient connected to nasal cannula oxygen Cardiovascular status: blood pressure returned to baseline and stable Postop Assessment: no apparent nausea or vomiting Anesthetic complications: no   No complications documented.  Last Vitals:  Vitals:   10/09/20 0945 10/09/20 0951  BP: 103/78   Pulse: 71 61  Resp: (!) 21 (!) 21  Temp:    SpO2: 100% 100%    Last Pain:  Vitals:   10/09/20 0951  TempSrc:   PainSc: 0-No pain                 Barnet Glasgow

## 2020-10-10 ENCOUNTER — Encounter (HOSPITAL_COMMUNITY): Payer: Self-pay | Admitting: General Surgery

## 2020-10-10 LAB — CBC
HCT: 29.7 % — ABNORMAL LOW (ref 39.0–52.0)
Hemoglobin: 9.5 g/dL — ABNORMAL LOW (ref 13.0–17.0)
MCH: 31.5 pg (ref 26.0–34.0)
MCHC: 32 g/dL (ref 30.0–36.0)
MCV: 98.3 fL (ref 80.0–100.0)
Platelets: 92 10*3/uL — ABNORMAL LOW (ref 150–400)
RBC: 3.02 MIL/uL — ABNORMAL LOW (ref 4.22–5.81)
RDW: 14.9 % (ref 11.5–15.5)
WBC: 11.2 10*3/uL — ABNORMAL HIGH (ref 4.0–10.5)
nRBC: 0 % (ref 0.0–0.2)

## 2020-10-10 LAB — BASIC METABOLIC PANEL
Anion gap: 9 (ref 5–15)
BUN: 34 mg/dL — ABNORMAL HIGH (ref 8–23)
CO2: 21 mmol/L — ABNORMAL LOW (ref 22–32)
Calcium: 7.6 mg/dL — ABNORMAL LOW (ref 8.9–10.3)
Chloride: 109 mmol/L (ref 98–111)
Creatinine, Ser: 1.57 mg/dL — ABNORMAL HIGH (ref 0.61–1.24)
GFR, Estimated: 46 mL/min — ABNORMAL LOW (ref >60)
Glucose, Bld: 142 mg/dL — ABNORMAL HIGH (ref 70–99)
Potassium: 4.1 mmol/L (ref 3.5–5.1)
Sodium: 139 mmol/L (ref 135–145)

## 2020-10-10 NOTE — Progress Notes (Signed)
     Assessment & Plan: POD#1 - status post ileostomy closure - Dr. Leighton Ruff  [phone]  Soft diet  Ambulate  Will change dressing on wound tomorrow        Armandina Gemma, MD       [facility]       Office: [phone]   Chief Complaint: Ileostomy takedown  Subjective: Patient in bed, comfortable.  No complaints.  Passing flatus.  Objective: Vital signs in last 24 hours: Temp:  [97.4 F (36.3 C)-98.4 F (36.9 C)] 97.5 F (36.4 C) (05/21 0214) Pulse Rate:  [54-74] 58 (05/21 0214) Resp:  [16-18] 18 (05/21 0214) BP: (113-135)/(55-67) 113/63 (05/21 0214) SpO2:  [98 %-100 %] 100 % (05/21 0214) Last BM Date: 10/08/20  Intake/Output from previous day: 05/20 0701 - 05/21 0700 In: 2893.1 [P.O.:840; I.V.:1953.1; IV Piggyback:100] Out: 128 [Urine:125; Blood:3] Intake/Output this shift: Total I/O In: 120 [P.O.:120] Out: -   Physical Exam: HEENT - sclerae clear, mucous membranes moist Neck - soft Abdomen - soft without distension; dressing intact Ext - no edema, non-tender Neuro - alert & oriented, no focal deficits  Lab Results:  Recent Labs    10/08/20 0912 10/10/20 0245  WBC 4.0 11.2*  HGB 10.2* 9.5*  HCT 31.3* 29.7*  PLT 99* 92*   BMET Recent Labs    10/08/20 0912 10/10/20 0245  NA 145 139  K 3.9 4.1  CL 111 109  CO2 23 21*  GLUCOSE 104* 142*  BUN 31* 34*  CREATININE 1.65* 1.57*  CALCIUM 8.2* 7.6*   PT/INR No results for input(s): LABPROT, INR in the last 72 hours. Comprehensive Metabolic Panel:    Component Value Date/Time   NA 139 10/10/2020 0245   NA 145 10/08/2020 0912   NA 140 12/09/2016 0932   NA 141 12/01/2016 1525   K 4.1 10/10/2020 0245   K 3.9 10/08/2020 0912   CL 109 10/10/2020 0245   CL 111 10/08/2020 0912   CO2 21 (L) 10/10/2020 0245   CO2 23 10/08/2020 0912   BUN 34 (H) 10/10/2020 0245   BUN 31 (H) 10/08/2020 0912   BUN 29 (H) 12/09/2016 0932   BUN 37 (H) 12/01/2016 1525   CREATININE 1.57 (H)  10/10/2020 0245   CREATININE 1.65 (H) 10/08/2020 0912   CREATININE 2.11 (H) 09/30/2020 1335   CREATININE 1.33 (H) 07/31/2020 1400   CREATININE 1.61 (H) 01/21/2016 1030   CREATININE 1.79 (H) 01/09/2016 1105   GLUCOSE 142 (H) 10/10/2020 0245   GLUCOSE 104 (H) 10/08/2020 0912   CALCIUM 7.6 (L) 10/10/2020 0245   CALCIUM 8.2 (L) 10/08/2020 0912   CALCIUM 7.0 (L) 10/09/2015 0458   AST 25 10/08/2020 0912   AST 19 08/25/2020 0526   AST 18 08/24/2020 1204   AST 17 07/31/2020 1400   ALT 13 10/08/2020 0912   ALT 15 08/25/2020 0526   ALT 17 08/24/2020 1204   ALT 11 07/31/2020 1400   ALKPHOS 67 10/08/2020 0912   ALKPHOS 71 08/25/2020 0526   BILITOT 0.4 10/08/2020 0912   BILITOT 0.7 08/25/2020 0526   BILITOT 0.9 08/24/2020 1204   BILITOT 0.4 07/31/2020 1400   PROT 6.7 10/08/2020 0912   PROT 6.6 08/25/2020 0526   ALBUMIN 3.5 10/08/2020 0912   ALBUMIN 3.5 08/25/2020 0526    Studies/Results: No results found.    Armandina Gemma 10/10/2020  Patient ID: Jared White, male   DOB: 02-06-1946, 75 y.o.   MRN: [id number]

## 2020-10-10 NOTE — Discharge Instructions (Signed)

## 2020-10-11 MED ORDER — CALCIUM POLYCARBOPHIL 625 MG PO TABS
625.0000 mg | ORAL_TABLET | Freq: Two times a day (BID) | ORAL | Status: DC
  Administered 2020-10-11 – 2020-10-12 (×3): 625 mg via ORAL
  Filled 2020-10-11 (×3): qty 1

## 2020-10-11 MED ORDER — LOPERAMIDE HCL 2 MG PO CAPS: 2.0000 mg | ORAL_CAPSULE | Freq: Two times a day (BID) | ORAL | Status: DC | PRN

## 2020-10-11 NOTE — Progress Notes (Signed)
Ami Thornsberry Schreckengost [id number] 1946/01/11  CARE TEAM:  PCP: [name], MD  Outpatient Care Team: Patient Care Team: [name], MD as PCP - General (Family Medicine) Delrae Rend, MD as Consulting Physician (Endocrinology) [place] as Attending Physician (Neurosurgery)  Inpatient Treatment Team: Treatment Team: Attending Provider: Leighton Ruff, MD; Technician: Ernest Mallick, NT; Utilization Review: Alease Medina, RN; Registered Nurse: Tanda Rockers, RN; Social Worker: Lowella Curb, LCSW   Problem List:   Active Problems:   Ileostomy in place [facility])   2 Days Post-Op  10/09/2020  PRE-OPERATIVE DIAGNOSIS:  RECTAL CANCER, ILEOSTOMY PRESENT  POST-OPERATIVE DIAGNOSIS:  RECTAL CANCER, ILEOSTOMY PRESENT  PROCEDURE:   LOOP ILEOSTOMY REVERSAL    Surgeon(s): Leighton Ruff, MD  ASSISTANT: Dr Wynelle Link     Assessment  Recovering  [facility] Stay = 2 days)  Plan:  -ERAS protocol -adv solid diet -fiber regimen.  PRN imodium q12h max -oral pain control -VTE prophylaxis- SCDs, etc -mobilize as tolerated to help recovery -d/c wick POD#3 - tomorrow AM  Disposition:  Disposition:  The patient is from: Home  Anticipate discharge to:  Home  Anticipated Date of Discharge is:  May 23,2022    Barriers to discharge:  Pending Clinical improvement (more likely than not)  Patient currently is NOT MEDICALLY STABLE for discharge from the hospital from a surgery standpoint.      20 minutes spent in review, evaluation, examination, counseling, and coordination of care.   I have reviewed this patient's available data, including medical history, events of note, physical examination and test results as part of my evaluation.  A significant portion of that time was spent in counseling.  Care during the described time interval was provided by me.  10/11/2020    Subjective: (Chief complaint)  Walking in  room Loose BMs - no major leaking Pain better controlled  Objective:  Vital signs:  Vitals:   10/10/20 0214 10/10/20 1356 10/10/20 2009 10/11/20 0615  BP: 113/63 120/69 (!) 156/69 (!) 144/72  Pulse: (!) 58 (!) 57 66 61  Resp: 18 18 16 18   Temp: (!) 97.5 F (36.4 C) 97.6 F (36.4 C) 98.2 F (36.8 C) 98.1 F (36.7 C)  TempSrc: Oral Oral Oral Oral  SpO2: 100% 100% 100% 99%  Weight:    64.7 kg  Height:        Last BM Date: 10/10/20  Intake/Output   Yesterday:  05/21 0701 - 05/22 0700 In: 2263.8 [P.O.:1080; I.V.:1183.8] Out: 1 [Stool:1] This shift:  No intake/output data recorded.  Bowel function:  Flatus: YES  BM:  YES  Drain: (No drain)   Physical Exam:  General: Pt awake/alert in no acute distress Eyes: PERRL, normal EOM.  Sclera clear.  No icterus Neuro: CN II-XII intact w/o focal sensory/motor deficits. Lymph: No head/neck/groin lymphadenopathy Psych:  No delerium/psychosis/paranoia.  Oriented x 4 HENT: Normocephalic, Mucus membranes moist.  No thrush Neck: Supple, No tracheal deviation.  No obvious thyromegaly Chest: No pain to chest wall compression.  Good respiratory excursion.  No audible wheezing CV:  Pulses intact.  Regular rhythm.  No major extremity edema MS: Normal AROM mjr joints.  No obvious deformity  Abdomen: Soft.  Nondistended.  Mildly tender at incisions only.  No evidence of peritonitis.  No incarcerated hernias.  Ext:  No deformity.  No mjr edema.  No cyanosis Skin: No petechiae / purpurea.  No major sores.  Warm and dry    Results:  Cultures: Recent Results (from the past 720 hour(s))  SARS CORONAVIRUS 2 (TAT 6-24 HRS) Nasopharyngeal Nasopharyngeal Swab     Status: None   Collection Time: 10/06/20  8:31 AM   Specimen: Nasopharyngeal Swab  Result Value Ref Range Status   SARS Coronavirus 2 NEGATIVE NEGATIVE Final    Comment: (NOTE) SARS-CoV-2 target nucleic acids are NOT DETECTED.  The SARS-CoV-2 RNA is generally detectable  in upper and lower respiratory specimens during the acute phase of infection. Negative results do not preclude SARS-CoV-2 infection, do not rule out co-infections with other pathogens, and should not be used as the sole basis for treatment or other patient management decisions. Negative results must be combined with clinical observations, patient history, and epidemiological information. The expected result is Negative.  Fact Sheet for Patients: SugarRoll.be  Fact Sheet for Healthcare Providers: https://www.woods-mathews.com/  This test is not yet approved or cleared by the [place] FDA and  has been authorized for detection and/or diagnosis of SARS-CoV-2 by FDA under an Emergency Use Authorization (EUA). This EUA will remain  in effect (meaning this test can be used) for the duration of the COVID-19 declaration under Se ction 564(b)(1) of the Act, 21 U.S.C. section 360bbb-3(b)(1), unless the authorization is terminated or revoked sooner.  Performed at [facility], [place] [address] [place], [place] [address]     Labs: Results for orders placed or performed during the hospital encounter of 10/09/20 (from the past 48 hour(s))  CBC     Status: Abnormal   Collection Time: 10/10/20  2:45 AM  Result Value Ref Range   WBC 11.2 (H) 4.0 - 10.5 K/uL   RBC 3.02 (L) 4.22 - 5.81 MIL/uL   Hemoglobin 9.5 (L) 13.0 - 17.0 g/dL   HCT 29.7 (L) 39.0 - 52.0 %   MCV 98.3 80.0 - 100.0 fL   MCH 31.5 26.0 - 34.0 pg   MCHC 32.0 30.0 - 36.0 g/dL   RDW 14.9 11.5 - 15.5 %   Platelets 92 (L) 150 - 400 K/uL    Comment: Immature Platelet Fraction may be clinically indicated, consider ordering this additional test [id number]    nRBC 0.0 0.0 - 0.2 %    Comment: Performed at [facility], [place] [address], [place], [place] [address]  Basic metabolic panel     Status: Abnormal   Collection Time: 10/10/20  2:45 AM  Result Value Ref Range    Sodium 139 135 - 145 mmol/L   Potassium 4.1 3.5 - 5.1 mmol/L   Chloride 109 98 - 111 mmol/L   CO2 21 (L) 22 - 32 mmol/L   Glucose, Bld 142 (H) 70 - 99 mg/dL    Comment: Glucose reference range applies only to samples taken after fasting for at least 8 hours.   BUN 34 (H) 8 - 23 mg/dL   Creatinine, Ser 1.57 (H) 0.61 - 1.24 mg/dL   Calcium 7.6 (L) 8.9 - 10.3 mg/dL   GFR, Estimated 46 (L) >60 mL/min    Comment: (NOTE) Calculated using the CKD-EPI Creatinine Equation (2021)    Anion gap 9 5 - 15    Comment: Performed at [facility], [place] [address] [place], [place] [address]    Imaging / Studies: No results found.  Medications / Allergies: per chart  Antibiotics: Anti-infectives (From admission, onward)   Start     Dose/Rate Route Frequency Ordered Stop   10/09/20 2000  cefoTEtan (CEFOTAN) 2 g in sodium chloride 0.9 %  100 mL IVPB        2 g 200 mL/hr over 30 Minutes Intravenous Every 12 hours 10/09/20 1024 10/09/20 2048   10/09/20 0600  cefoTEtan (CEFOTAN) 2 g in sodium chloride 0.9 % 100 mL IVPB        2 g 200 mL/hr over 30 Minutes Intravenous On call to O.R. 10/09/20 8101 10/09/20 7510        Note: Portions of this report may have been transcribed using voice recognition software. Every effort was made to ensure accuracy; however, inadvertent computerized transcription errors may be present.   Any transcriptional errors that result from this process are unintentional.    Adin Hector, MD, FACS, MASCRS  Esophageal, Gastrointestinal & Colorectal Surgery Robotic and Minimally Invasive Surgery [facility] [address] [place], [place] [address] [phone] Fax [phone] Main/Paging  CONTACT INFORMATION: Weekday (9AM-5PM) concerns: Call [facility] main office at [phone] Weeknight (5PM-9AM) or Weekend/Holiday concerns: Check [email or web address] for General Surgery [facility] coverage (Please, do not use SecureChat as it is  not reliable communication to operating surgeons for immediate patient care)      10/11/2020  9:13 AM

## 2020-10-12 LAB — SURGICAL PATHOLOGY

## 2020-10-12 MED ORDER — CALCIUM POLYCARBOPHIL 625 MG PO TABS: 625.0000 mg | ORAL_TABLET | Freq: Two times a day (BID) | ORAL | 1 refills | Status: DC

## 2020-10-12 MED ORDER — LOPERAMIDE HCL 2 MG PO CAPS: 2.0000 mg | ORAL_CAPSULE | Freq: Four times a day (QID) | ORAL | 2 refills | Status: AC | PRN

## 2020-10-12 MED ORDER — OXYCODONE HCL 5 MG PO TABS: 5.0000 mg | ORAL_TABLET | ORAL | 0 refills | Status: DC | PRN

## 2020-10-12 NOTE — Progress Notes (Signed)
Discharge instructions given to patient and all questions were answered.  

## 2020-10-12 NOTE — Discharge Summary (Signed)
Physician Discharge Summary  Patient ID: Jared White MRN: [id number] DOB/AGE: 75-Jan-1947 76 y.o.  Admit date: 10/09/2020 Discharge date: 10/12/2020  Admission Diagnoses: Ileostomy in place  Discharge Diagnoses:  Active Problems:   CAD (coronary artery disease)   GERD (gastroesophageal reflux disease)   Essential hypertension   Stage 3b chronic kidney disease ([place])   Rectal cancer ([place])   Port-A-Cath in place   History of rectal cancer   Barrett's esophagus without dysplasia   Ileostomy in place [facility])   Discharged Condition: good  Hospital Course: Patient was admitted to the hospital after loop ileostomy reversal.  He is diet was advanced as tolerated.  He began to have bowel function.  By postop day 3 he was felt to be in stable condition for discharge to home.  I recommended that he continue using Imodium as needed for solid bowel movements.  Consults: None  Significant Diagnostic Studies: labs: cbc, bmet  Treatments: IV hydration, analgesia: oxycodone and surgery: loop ileostomy reversal  Discharge Exam: Blood pressure (!) 151/73, pulse 79, temperature 98.3 F (36.8 C), temperature source Oral, resp. rate 17, height 5\' 8"  (1.727 m), weight 64.7 kg, SpO2 100 %. General appearance: alert and cooperative GI: normal findings: soft, non-tender Incision/Wound: clean, wick removed  Disposition: Discharge disposition: 01-Home or Self Care        Allergies as of 10/12/2020   No Known Allergies     Medication List    STOP taking these medications   Loperamide-Simethicone 2-125 MG Tabs   sodium chloride 0.9 %     TAKE these medications   ascorbic acid 500 MG tablet Commonly known as: VITAMIN C Take 500 mg by mouth daily.   aspirin 81 MG EC tablet Take 81 mg by mouth daily.   atenolol 50 MG tablet Commonly known as: TENORMIN TAKE 1 TABLET BY MOUTH EVERY DAY   Calcium 600-200 MG-UNIT tablet Take 1 tablet by mouth daily.   cholecalciferol 25 MCG (1000 UNIT)  tablet Commonly known as: VITAMIN D3 Take 1,000 Units by mouth daily.   loperamide 2 MG capsule Commonly known as: IMODIUM Take 1 capsule (2 mg total) by mouth 4 (four) times daily as needed. What changed:   how much to take  how to take this  when to take this  reasons to take this   Magnesium 400 MG Tabs Take 400 mg by mouth daily.   nitroGLYCERIN 0.4 MG SL tablet Commonly known as: NITROSTAT Place 0.4 mg under the tongue every 5 (five) minutes x 3 doses as needed for chest pain.   omeprazole 40 MG capsule Commonly known as: PRILOSEC TAKE 1 CAPSULE BY MOUTH TWICE A DAY What changed: when to take this   oxyCODONE 5 MG immediate release tablet Commonly known as: Oxy IR/ROXICODONE Take 1 tablet (5 mg total) by mouth every 4 (four) hours as needed for moderate pain.   polycarbophil 625 MG tablet Commonly known as: FIBERCON Take 1 tablet (625 mg total) by mouth 2 (two) times daily. What changed: when to take this   rosuvastatin 20 MG tablet Commonly known as: CRESTOR TAKE 1 TABLET BY MOUTH EVERY DAY   valsartan 160 MG tablet Commonly known as: DIOVAN Take 160 mg by mouth daily.   vitamin B-12 50 MCG tablet Commonly known as: CYANOCOBALAMIN Take 50 mcg by mouth daily.   vitamin E 45 MG (100 UNITS) capsule Take 100 Units by mouth daily.       Follow-up Information    Leighton Ruff,  MD. Schedule an appointment as soon as possible for a visit in 2 week(s).   Specialties: General Surgery, Colon and Rectal Surgery Contact information: [place] [address] [phone]               Signed: Rosario Adie 7/79/3903, 8:37 AM

## 2020-10-14 DIAGNOSIS — N1832 Chronic kidney disease, stage 3b: Secondary | ICD-10-CM | POA: Diagnosis not present

## 2020-10-14 DIAGNOSIS — Z452 Encounter for adjustment and management of vascular access device: Secondary | ICD-10-CM | POA: Diagnosis not present

## 2020-10-14 DIAGNOSIS — Z483 Aftercare following surgery for neoplasm: Secondary | ICD-10-CM | POA: Diagnosis not present

## 2020-10-14 DIAGNOSIS — Z85048 Personal history of other malignant neoplasm of rectum, rectosigmoid junction, and anus: Secondary | ICD-10-CM | POA: Diagnosis not present

## 2020-10-14 DIAGNOSIS — I129 Hypertensive chronic kidney disease with stage 1 through stage 4 chronic kidney disease, or unspecified chronic kidney disease: Secondary | ICD-10-CM | POA: Diagnosis not present

## 2020-10-14 DIAGNOSIS — I2510 Atherosclerotic heart disease of native coronary artery without angina pectoris: Secondary | ICD-10-CM | POA: Diagnosis not present

## 2020-10-15 ENCOUNTER — Other Ambulatory Visit: Payer: Self-pay

## 2020-10-15 ENCOUNTER — Inpatient Hospital Stay (HOSPITAL_BASED_OUTPATIENT_CLINIC_OR_DEPARTMENT_OTHER): Payer: Medicare Other | Admitting: Oncology

## 2020-10-15 DIAGNOSIS — N189 Chronic kidney disease, unspecified: Secondary | ICD-10-CM | POA: Diagnosis not present

## 2020-10-15 DIAGNOSIS — C20 Malignant neoplasm of rectum: Secondary | ICD-10-CM

## 2020-10-15 DIAGNOSIS — Z79899 Other long term (current) drug therapy: Secondary | ICD-10-CM | POA: Diagnosis not present

## 2020-10-15 DIAGNOSIS — C779 Secondary and unspecified malignant neoplasm of lymph node, unspecified: Secondary | ICD-10-CM | POA: Diagnosis not present

## 2020-10-15 DIAGNOSIS — D6959 Other secondary thrombocytopenia: Secondary | ICD-10-CM | POA: Diagnosis not present

## 2020-10-15 DIAGNOSIS — R197 Diarrhea, unspecified: Secondary | ICD-10-CM | POA: Diagnosis not present

## 2020-10-15 NOTE — Progress Notes (Signed)
Hematology and Oncology Follow Up Visit  Jared White [id number] [birth year] 75 y.o. 10/15/2020 8:36 AM Jared White, MDGreene, [name], MD   Principle Diagnosis: 75 year old man with rectal cancer diagnosed with T3N2 in March 2021.     Prior Therapy:   He is status post colonoscopy and endoscopy completed on August 13, 2019.  Rectal biopsy showed an invasive adenocarcinoma.  Neoadjuvant chemotherapy utilizing FOLFOX started on September 05 2019.   He completed 7 cycles of therapy in August 2021.  Radiation therapy with oral Xeloda started on September 27.  Therapy completed on November 3 of 2021.  He received 45 Gy in 25 fractions to the rectum and regional lymph nodes.  He is status post a robotic assisted lower anterior resection with diverting loop ileostomy completed on June 10, 2020.  The final pathology showed metastatic adenocarcinoma to 2 out 7 lymph nodes with extranodal extension.  No evidence of residual dysplasia or carcinoma of the colonic wall.  Margins were negative.   He is status post loop ileostomy reversal completed by Dr. Marcello Moores on Oct 09, 2020.  Current therapy: Active surveillance.   Interim History: [name] returns today for repeat evaluation.  Since the last visit, he underwent ileostomy reversal last week without any major complications.  He denies any nausea, vomiting or abdominal pain.  He does have an element of loose bowel habits and some diarrhea with some rectal irritation which is improving slowly.  His appetite has been down lost few pounds although it is improving as of late.  He denies any recent hospitalization or illnesses.    Medications: Updated on review. Current Outpatient Medications  Medication Sig Dispense Refill  . ascorbic acid (VITAMIN C) 500 MG tablet Take 500 mg by mouth daily.    Marland Kitchen aspirin 81 MG EC tablet Take 81 mg by mouth daily.    Marland Kitchen atenolol (TENORMIN) 50 MG tablet TAKE 1 TABLET BY MOUTH EVERY DAY (Patient taking differently:  Take 50 mg by mouth daily.) 90 tablet 3  . Calcium 600-200 MG-UNIT tablet Take 1 tablet by mouth daily.    . cholecalciferol (VITAMIN D3) 25 MCG (1000 UNIT) tablet Take 1,000 Units by mouth daily.    Marland Kitchen loperamide (IMODIUM) 2 MG capsule Take 1 capsule (2 mg total) by mouth 4 (four) times daily as needed. 60 capsule 2  . Magnesium 400 MG TABS Take 400 mg by mouth daily.    . nitroGLYCERIN (NITROSTAT) 0.4 MG SL tablet Place 0.4 mg under the tongue every 5 (five) minutes x 3 doses as needed for chest pain.    Marland Kitchen omeprazole (PRILOSEC) 40 MG capsule TAKE 1 CAPSULE BY MOUTH TWICE A DAY (Patient taking differently: Take 40 mg by mouth daily.) 180 capsule 1  . oxyCODONE (OXY IR/ROXICODONE) 5 MG immediate release tablet Take 1 tablet (5 mg total) by mouth every 4 (four) hours as needed for moderate pain. 30 tablet 0  . polycarbophil (FIBERCON) 625 MG tablet Take 1 tablet (625 mg total) by mouth 2 (two) times daily. 60 tablet 1  . rosuvastatin (CRESTOR) 20 MG tablet TAKE 1 TABLET BY MOUTH EVERY DAY (Patient taking differently: Take 20 mg by mouth daily.) 90 tablet 2  . valsartan (DIOVAN) 160 MG tablet Take 160 mg by mouth daily.    . vitamin B-12 (CYANOCOBALAMIN) 50 MCG tablet Take 50 mcg by mouth daily.    . vitamin E 45 MG (100 UNITS) capsule Take 100 Units by mouth daily.  No current facility-administered medications for this visit.     Allergies: No Known Allergies    Physical Exam:     ECOG: 1      General appearance: Alert, awake without any distress. Head: Atraumatic without abnormalities Oropharynx: Without any thrush or ulcers. Eyes: No scleral icterus. Lymph nodes: No lymphadenopathy noted in the cervical, supraclavicular, or axillary nodes Heart:regular rate and rhythm, without any murmurs or gallops.   Lung: Clear to auscultation without any rhonchi, wheezes or dullness to percussion. Abdomin: Soft, nontender without any shifting dullness or ascites. Musculoskeletal: No  clubbing or cyanosis. Neurological: No motor or sensory deficits. Skin: No rashes or lesions.                Lab Results: Lab Results  Component Value Date   WBC 11.2 (H) 10/10/2020   HGB 9.5 (L) 10/10/2020   HCT 29.7 (L) 10/10/2020   MCV 98.3 10/10/2020   PLT 92 (L) 10/10/2020     Chemistry      Component Value Date/Time   NA 139 10/10/2020 0245   NA 140 12/09/2016 0932   K 4.1 10/10/2020 0245   CL 109 10/10/2020 0245   CO2 21 (L) 10/10/2020 0245   BUN 34 (H) 10/10/2020 0245   BUN 29 (H) 12/09/2016 0932   CREATININE 1.57 (H) 10/10/2020 0245   CREATININE 1.65 (H) 10/08/2020 0912   CREATININE 1.61 (H) 01/21/2016 1030      Component Value Date/Time   CALCIUM 7.6 (L) 10/10/2020 0245   CALCIUM 7.0 (L) 10/09/2015 0458   ALKPHOS 67 10/08/2020 0912   AST 25 10/08/2020 0912   ALT 13 10/08/2020 0912   BILITOT 0.4 10/08/2020 0912         Impression and Plan:  75 year old man with:  1.  Rectal cancer diagnosed in March 2021.  He presented with pelvic lymphadenopathy and T3N2 disease.  He is currently on active surveillance after completing definitive therapy outlined above.  I recommended repeat imaging studies in the next few months and annually after that.  Risk of relapse was assessed and given his lymph node disease he would be at high risk of developing recurrence.  Salvage therapy at that time would be chemotherapy to be reserved only if he has relapsed disease.  2.  IV access: Port-A-Cath remains in place.  Risks and benefits of Port-A-Cath removal were discussed at this time.   3.  Thrombocytopenia: Related to chemotherapy continues to improve slowly.  4.  Chronic renal insufficiency: Creatinine on Oct 10, 2020 was within normal range at this time.  5.  Follow-up: We will be in the next 2 months for repeat imaging studies and MD follow-up.     30  minutes were spent on this visit.  The time was dedicated to reviewing laboratory data, disease  status update and outlining future plan of care.    [name], MD 5/26/20228:36 AM

## 2020-10-16 DIAGNOSIS — I2510 Atherosclerotic heart disease of native coronary artery without angina pectoris: Secondary | ICD-10-CM | POA: Diagnosis not present

## 2020-10-16 DIAGNOSIS — N1832 Chronic kidney disease, stage 3b: Secondary | ICD-10-CM | POA: Diagnosis not present

## 2020-10-16 DIAGNOSIS — Z85048 Personal history of other malignant neoplasm of rectum, rectosigmoid junction, and anus: Secondary | ICD-10-CM | POA: Diagnosis not present

## 2020-10-16 DIAGNOSIS — Z483 Aftercare following surgery for neoplasm: Secondary | ICD-10-CM | POA: Diagnosis not present

## 2020-10-16 DIAGNOSIS — Z452 Encounter for adjustment and management of vascular access device: Secondary | ICD-10-CM | POA: Diagnosis not present

## 2020-10-16 DIAGNOSIS — I129 Hypertensive chronic kidney disease with stage 1 through stage 4 chronic kidney disease, or unspecified chronic kidney disease: Secondary | ICD-10-CM | POA: Diagnosis not present

## 2020-11-27 ENCOUNTER — Telehealth: Payer: Self-pay | Admitting: Oncology

## 2020-11-27 NOTE — Telephone Encounter (Signed)
RN Raul Del from [place] called to sch an appt for pt. I informed her that pt is already scheduled for a scan and f/u with provider. I gave her the appts information and she confirmed that pt would be taken to the appts.

## 2020-12-01 ENCOUNTER — Inpatient Hospital Stay: Payer: Medicare Other | Attending: Oncology

## 2020-12-01 ENCOUNTER — Ambulatory Visit (HOSPITAL_COMMUNITY)
Admission: RE | Admit: 2020-12-01 | Discharge: 2020-12-01 | Disposition: A | Discharge status: Home / Self Care | Payer: Medicare Other | Source: Ambulatory Visit | Attending: Oncology | Admitting: Oncology

## 2020-12-01 ENCOUNTER — Other Ambulatory Visit: Payer: Self-pay

## 2020-12-01 DIAGNOSIS — C20 Malignant neoplasm of rectum: Secondary | ICD-10-CM | POA: Insufficient documentation

## 2020-12-01 DIAGNOSIS — Z95828 Presence of other vascular implants and grafts: Secondary | ICD-10-CM

## 2020-12-01 LAB — CBC WITH DIFFERENTIAL (CANCER CENTER ONLY)
Abs Immature Granulocytes: 0.03 10*3/uL (ref 0.00–0.07)
Basophils Absolute: 0 10*3/uL (ref 0.0–0.1)
Basophils Relative: 1 %
Eosinophils Absolute: 0.1 10*3/uL (ref 0.0–0.5)
Eosinophils Relative: 3 %
HCT: 29.5 % — ABNORMAL LOW (ref 39.0–52.0)
Hemoglobin: 10.2 g/dL — ABNORMAL LOW (ref 13.0–17.0)
Immature Granulocytes: 1 %
Lymphocytes Relative: 15 %
Lymphs Abs: 0.8 10*3/uL (ref 0.7–4.0)
MCH: 31.8 pg (ref 26.0–34.0)
MCHC: 34.6 g/dL (ref 30.0–36.0)
MCV: 91.9 fL (ref 80.0–100.0)
Monocytes Absolute: 0.6 10*3/uL (ref 0.1–1.0)
Monocytes Relative: 10 %
Neutro Abs: 3.9 10*3/uL (ref 1.7–7.7)
Neutrophils Relative %: 70 %
Platelet Count: 120 10*3/uL — ABNORMAL LOW (ref 150–400)
RBC: 3.21 MIL/uL — ABNORMAL LOW (ref 4.22–5.81)
RDW: 14.4 % (ref 11.5–15.5)
WBC Count: 5.4 10*3/uL (ref 4.0–10.5)
nRBC: 0 % (ref 0.0–0.2)

## 2020-12-01 LAB — CMP (CANCER CENTER ONLY)
ALT: 13 U/L (ref 0–44)
AST: 19 U/L (ref 15–41)
Albumin: 3.1 g/dL — ABNORMAL LOW (ref 3.5–5.0)
Alkaline Phosphatase: 73 U/L (ref 38–126)
Anion gap: 8 (ref 5–15)
BUN: 28 mg/dL — ABNORMAL HIGH (ref 8–23)
CO2: 27 mmol/L (ref 22–32)
Calcium: 7.9 mg/dL — ABNORMAL LOW (ref 8.9–10.3)
Chloride: 105 mmol/L (ref 98–111)
Creatinine: 1.5 mg/dL — ABNORMAL HIGH (ref 0.61–1.24)
GFR, Estimated: 49 mL/min — ABNORMAL LOW (ref >60)
Glucose, Bld: 100 mg/dL — ABNORMAL HIGH (ref 70–99)
Potassium: 3.2 mmol/L — ABNORMAL LOW (ref 3.5–5.1)
Sodium: 140 mmol/L (ref 135–145)
Total Bilirubin: 0.5 mg/dL (ref 0.3–1.2)
Total Protein: 6.3 g/dL — ABNORMAL LOW (ref 6.5–8.1)

## 2020-12-01 IMAGING — CT CT ABD-PELV W/O CM
2 of 4 series · 13 of 36 positions shown, 16 images · non-contrast
Comparison: [DATE] CT chest, abdomen and pelvis.

CLINICAL DATA: Rectal cancer diagnosed [DATE] status post
chemotherapy and radiation therapy. Restaging.

EXAM:
CT CHEST, ABDOMEN AND PELVIS WITHOUT CONTRAST
TECHNIQUE: Multidetector CT imaging of the chest, abdomen and pelvis was
performed following the standard protocol without IV contrast.

[Series 2: cap w/o · axial · non-contrast · 0.88mm/px · z∈[+1117,+1682]mm · 10 of 139 slices shown, 13 images]
[im 13/139  mediastinal]
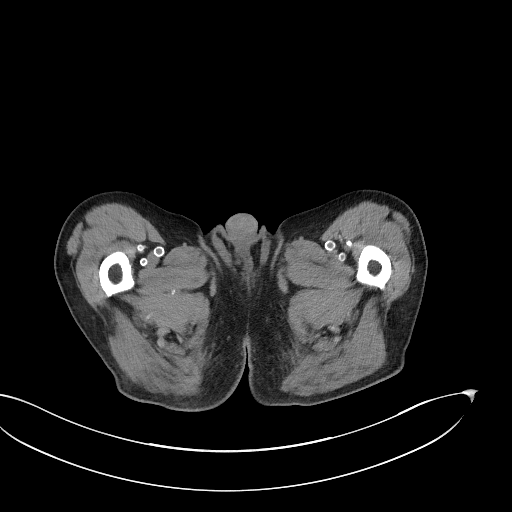
[im 13/139  lung]
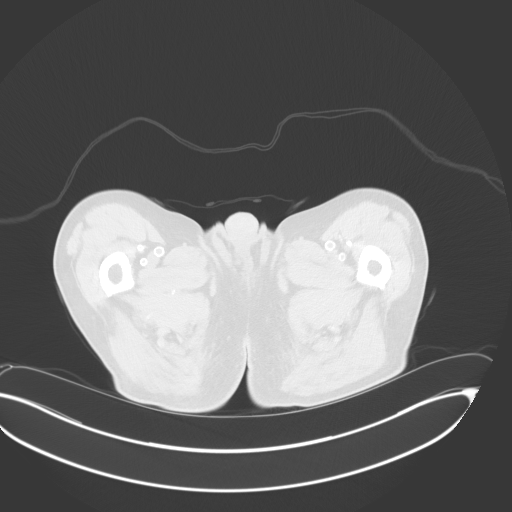
[im 26/139  lung]
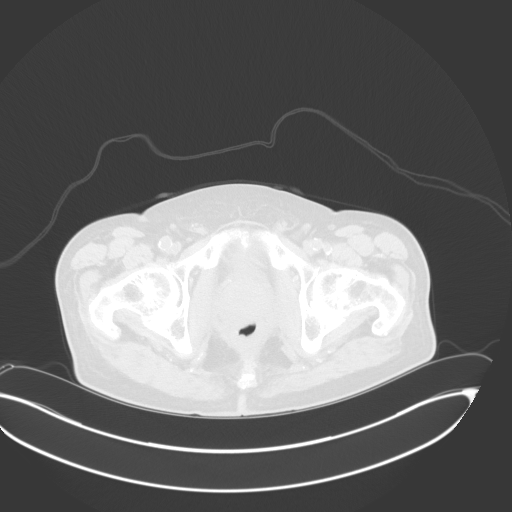
[im 38/139  lung]
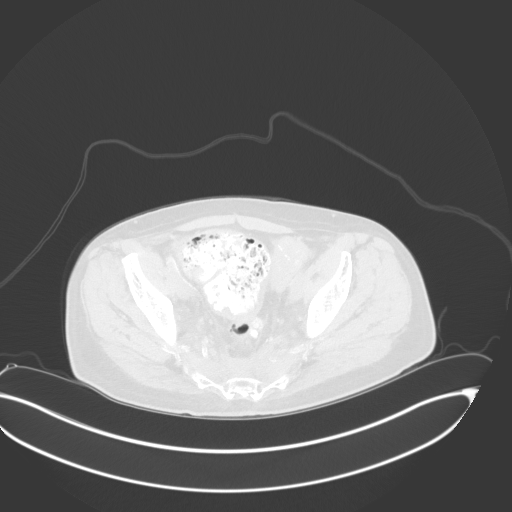
[im 51/139  lung]
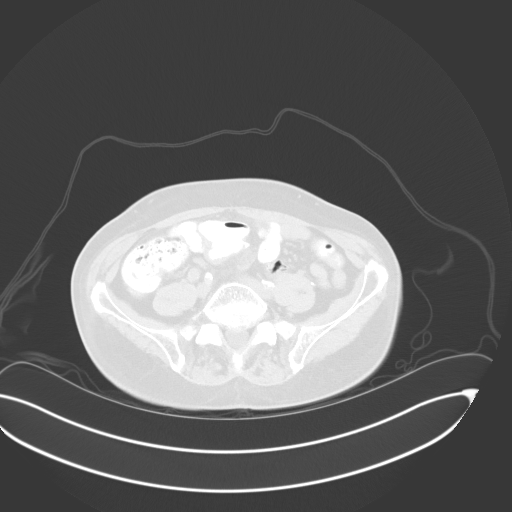
[im 63/139  mediastinal]
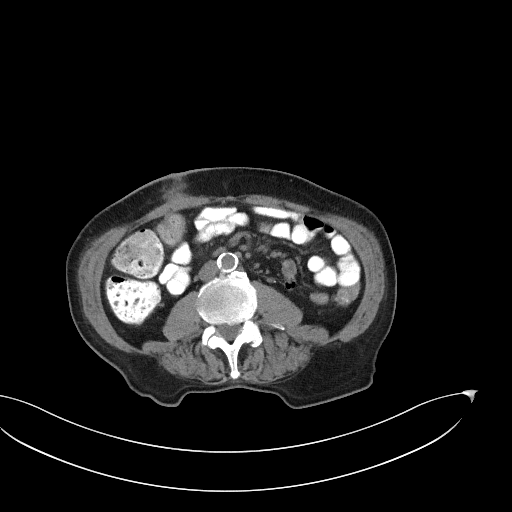
[im 63/139  lung]
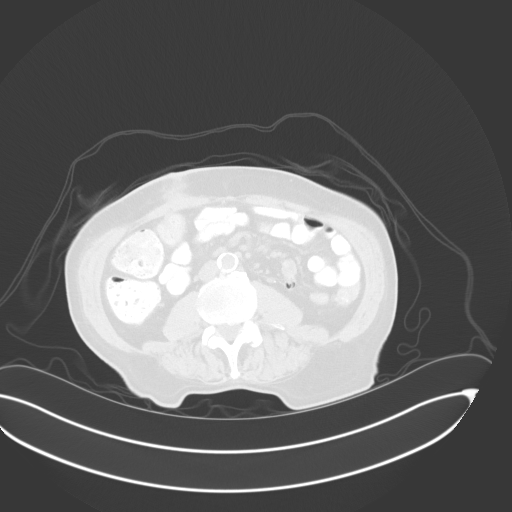
[im 76/139  lung]
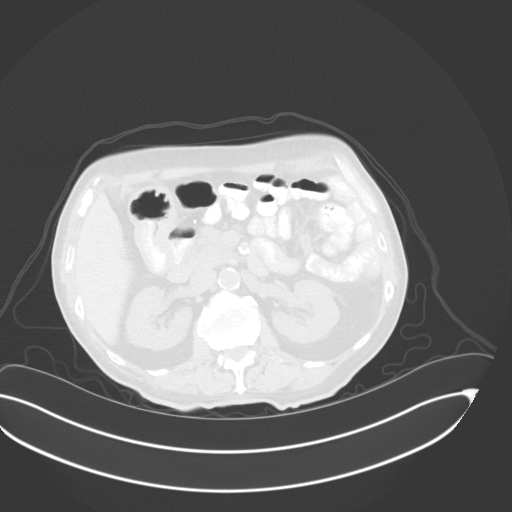
[im 88/139  lung]
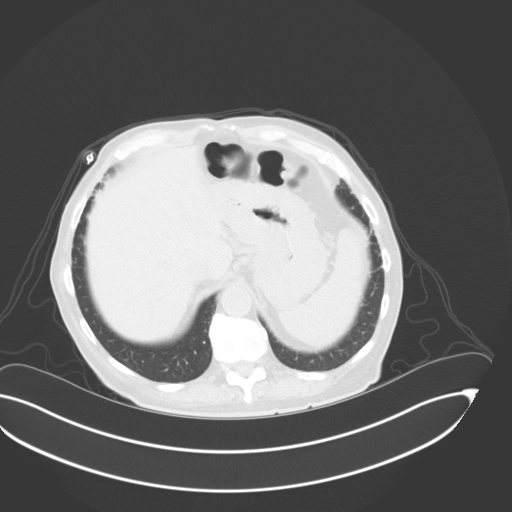
[im 101/139  lung]
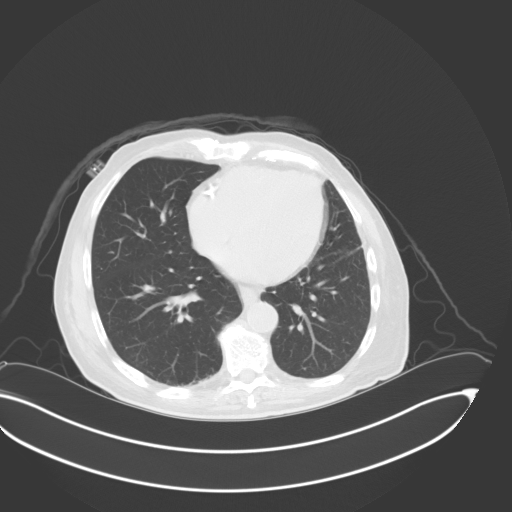
[im 113/139  mediastinal]
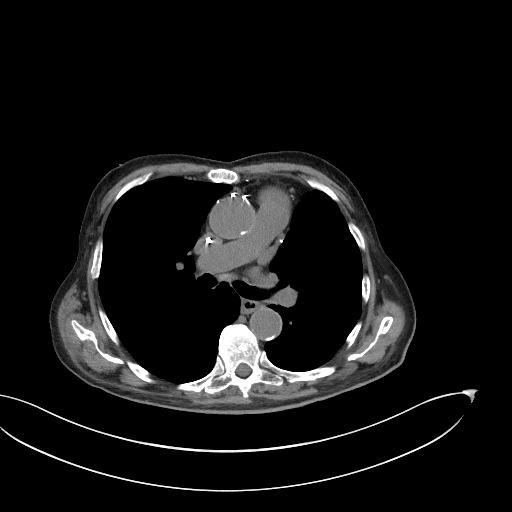
[im 113/139  lung]
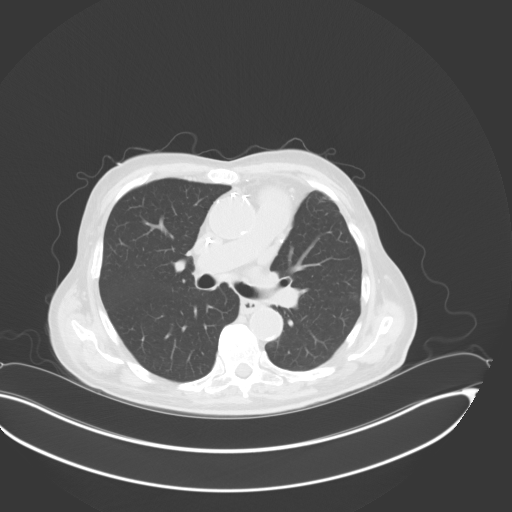
[im 126/139  lung]
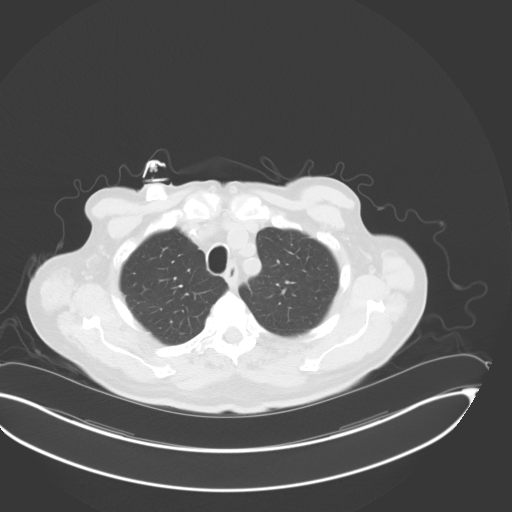

[Series 5: coronals · coronal · 0.87mm/px · 3 of 165 slices shown]
[im 33/165  lung]
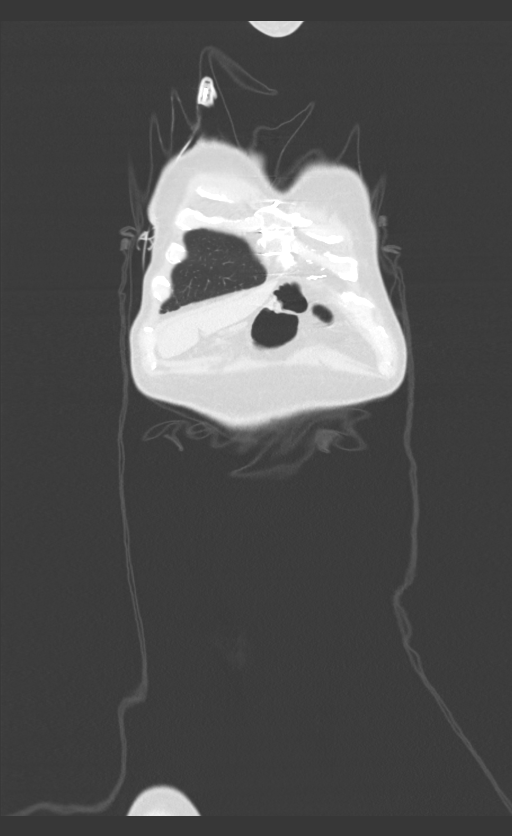
[im 66/165  lung]
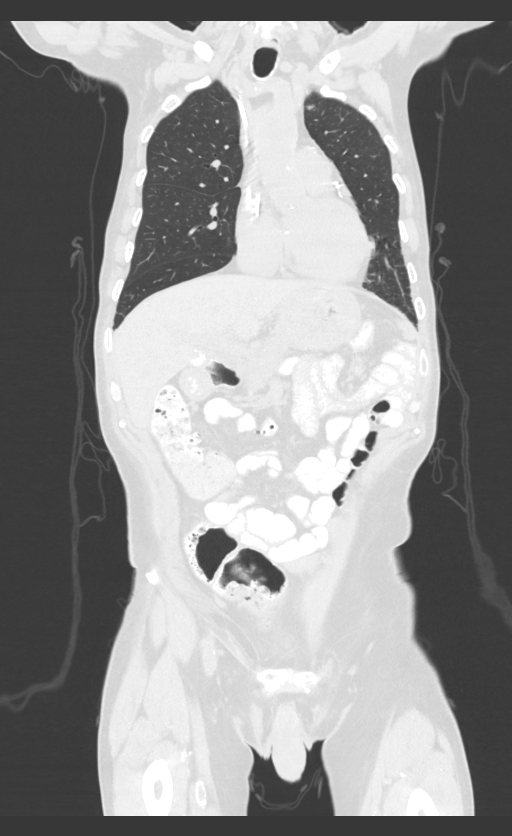
[im 99/165  lung]
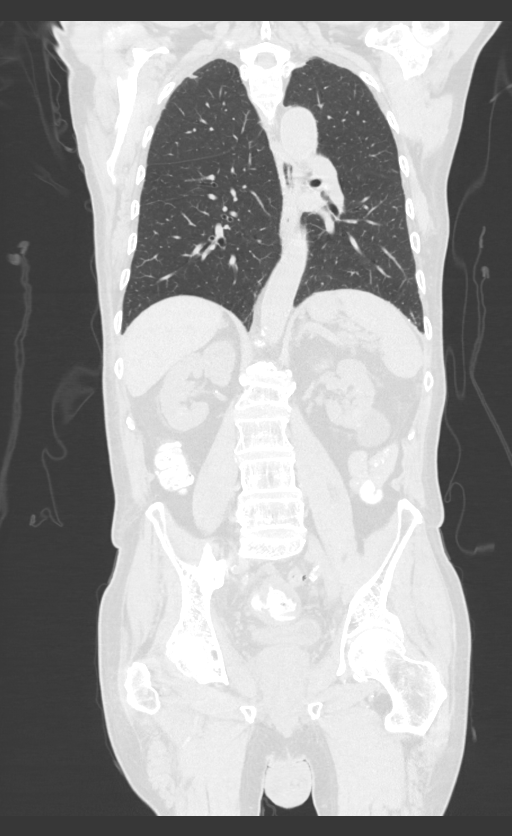

[13 of 36 positions shown; findings below may reference images not displayed]

FINDINGS: CT CHEST FINDINGS

Cardiovascular: Normal heart size. No significant pericardial
effusion/thickening. Left main and 3 vessel coronary atherosclerosis
status post CABG. Right internal jugular Port-A-Cath terminates at
the cavoatrial junction. Atherosclerotic nonaneurysmal thoracic
aorta. Normal caliber pulmonary arteries.

Mediastinum/Nodes: No discrete thyroid nodules. Unremarkable
esophagus. No pathologically enlarged axillary, mediastinal or hilar
lymph nodes, noting limited sensitivity for the detection of hilar
adenopathy on this noncontrast study.

Lungs/Pleura: No pneumothorax. No pleural effusion. No acute
consolidative airspace disease or lung masses. Subpleural peripheral
right middle lobe 3 mm solid pulmonary nodule (series 6/image 89) is
stable since at least [DATE] CT, considered benign. No new
significant pulmonary nodules.

Musculoskeletal: No aggressive appearing focal osseous lesions.
Chronic discontinuity in the lower most sternotomy wire. Otherwise
intact sternotomy wires. Marked thoracic spondylosis.

CT ABDOMEN PELVIS FINDINGS

Hepatobiliary: Normal liver size. Stable punctate granulomatous
calcification at the left liver dome. No liver masses.
Cholelithiasis. No biliary ductal dilatation.

Pancreas: Normal, with no mass or duct dilation.

Spleen: Normal size. No mass.

Adrenals/Urinary Tract: Normal adrenals. A few tiny 1-3 mm
nonobstructing stones scattered in both kidneys. No hydronephrosis.
Stable small 4 mm parenchymal calcification in the upper right
kidney. Simple left renal cysts, largest 3.6 cm in the lower left
kidney. No additional contour deforming renal lesions. Normal
bladder.

Stomach/Bowel: Small hiatal hernia. Nondistended stomach.
Generalized moderate gastric wall thickening throughout the proximal
and mid stomach, not definitely changed. Normal caliber small bowel
with no small bowel wall thickening. Normal appendix. Oral contrast
transits to the colon. Stable postsurgical changes with distal
colonic anastomosis. Moderate diffuse colonic diverticulosis. No
acute large bowel wall thickening or acute pericolonic fat
stranding. Presacral space soft tissue measures up to 1.6 cm
thickness, previously 1.6 cm using similar measurement technique,
stable.

Vascular/Lymphatic: Atherosclerotic nonaneurysmal abdominal aorta.
No pathologically enlarged lymph nodes in the abdomen or pelvis.

Reproductive: Mildly enlarged prostate.

Other: No pneumoperitoneum, ascites or focal fluid collection.

Musculoskeletal: No aggressive appearing focal osseous lesions.
Chronic moderate L2 vertebral compression fracture. Moderate lumbar
spondylosis.
IMPRESSION: 1. No evidence of metastatic disease in the chest, abdomen or
pelvis. Stable presacral space post treatment change.
2. Generalized moderate gastric wall thickening, not definitely
changed, nonspecific.
3. Chronic findings include: Cholelithiasis. Nonobstructing
bilateral nephrolithiasis. Moderate diffuse colonic diverticulosis.
Mildly enlarged prostate. Small hiatal hernia. Chronic moderate L2
vertebral compression fracture. Aortic Atherosclerosis
([69]-[69]).

## 2020-12-01 MED ORDER — SODIUM CHLORIDE 0.9% FLUSH
10.0000 mL | Freq: Once | INTRAVENOUS | Status: AC
  Administered 2020-12-01: 10 mL
  Filled 2020-12-01: qty 10

## 2020-12-09 ENCOUNTER — Other Ambulatory Visit: Payer: Self-pay | Admitting: Oncology

## 2020-12-09 DIAGNOSIS — C20 Malignant neoplasm of rectum: Secondary | ICD-10-CM

## 2020-12-09 NOTE — Progress Notes (Signed)
The results of the CT scan and the laboratory testing were reviewed today with the patient.  Clinically he is doing well without any complaints.  I recommended continued active surveillance with repeat imaging studies in 6 months.  Given the fact that he was recently evaluated we have opted to defer in person evaluation till the next visit in 6 months.  We will obtain imaging studies before the next visit and will continue with Port-A-Cath flush every 2 months.  He is in agreement with this plan.

## 2020-12-10 ENCOUNTER — Inpatient Hospital Stay: Payer: Medicare Other | Admitting: Oncology

## 2021-01-03 ENCOUNTER — Encounter: Payer: Self-pay | Admitting: Gastroenterology

## 2021-01-29 ENCOUNTER — Other Ambulatory Visit: Payer: Self-pay | Admitting: Gastroenterology

## 2021-01-29 DIAGNOSIS — K209 Esophagitis, unspecified without bleeding: Secondary | ICD-10-CM

## 2021-01-29 DIAGNOSIS — K2970 Gastritis, unspecified, without bleeding: Secondary | ICD-10-CM

## 2021-03-03 ENCOUNTER — Other Ambulatory Visit: Payer: Self-pay | Admitting: Gastroenterology

## 2021-03-03 DIAGNOSIS — K2970 Gastritis, unspecified, without bleeding: Secondary | ICD-10-CM

## 2021-03-03 DIAGNOSIS — K209 Esophagitis, unspecified without bleeding: Secondary | ICD-10-CM

## 2021-03-18 NOTE — Progress Notes (Signed)
Subjective:   Jared White, male    DOB: [birth year], 75 y.o.   MRN: [id number]  Chief complaint:  Coronary artery disease    HPI  75 year old male with coronary artery disease status post CABG 2001 vy Dr. Lucianne Lei Trigt (LIMA-LAD, SVG-Diag, seg SVG-ramus & OM, SVG-PDA), hypertension, CKD III w/IgA nephropathy, hyperlipidemia.  Patient underwent surgery for his rectal cancer.  Since then, he is reportedly remission.  However, he struggling with alternate constipation and diarrhea, as well as 50 pound weight loss.  He is staying active with walking for several minutes without any complaints of chest pain or shortness of breath.  He denies any melena or hematochezia on aspirin 81 mg daily.  He has not had any recent lipid panel checked.  Blood pressure is elevated today, but systolic blood pressure between 110 and 120 at home, according to the patient.  Current Outpatient Medications on File Prior to Visit  Medication Sig Dispense Refill   ascorbic acid (VITAMIN C) 500 MG tablet Take 500 mg by mouth daily.     aspirin 81 MG EC tablet Take 81 mg by mouth daily.     atenolol (TENORMIN) 50 MG tablet TAKE 1 TABLET BY MOUTH EVERY DAY (Patient taking differently: Take 50 mg by mouth daily.) 90 tablet 3   Calcium 600-200 MG-UNIT tablet Take 1 tablet by mouth daily.     cholecalciferol (VITAMIN D3) 25 MCG (1000 UNIT) tablet Take 1,000 Units by mouth daily.     loperamide (IMODIUM) 2 MG capsule Take 1 capsule (2 mg total) by mouth 4 (four) times daily as needed. 60 capsule 2   Magnesium 400 MG TABS Take 400 mg by mouth daily.     nitroGLYCERIN (NITROSTAT) 0.4 MG SL tablet Place 0.4 mg under the tongue every 5 (five) minutes x 3 doses as needed for chest pain.     omeprazole (PRILOSEC) 40 MG capsule TAKE 1 CAPSULE (40 MG TOTAL) BY MOUTH DAILY. 30 capsule 3   oxyCODONE (OXY IR/ROXICODONE) 5 MG immediate release tablet Take 1 tablet (5 mg total) by mouth every 4 (four) hours as needed for moderate pain. 30  tablet 0   polycarbophil (FIBERCON) 625 MG tablet Take 1 tablet (625 mg total) by mouth 2 (two) times daily. 60 tablet 1   rosuvastatin (CRESTOR) 20 MG tablet TAKE 1 TABLET BY MOUTH EVERY DAY (Patient taking differently: Take 20 mg by mouth daily.) 90 tablet 2   valsartan (DIOVAN) 160 MG tablet Take 160 mg by mouth daily.     vitamin B-12 (CYANOCOBALAMIN) 50 MCG tablet Take 50 mcg by mouth daily.     vitamin E 45 MG (100 UNITS) capsule Take 100 Units by mouth daily.     No current facility-administered medications on file prior to visit.    Cardiovascular studies:  EKG 03/19/2021: Sinus rhythm 65 bpm Normal EKG  Echocardiogram 03/23/2020:  Normal LV systolic function with visual EF 50-55%. Abnormal septal wall  motion due to post-operative coronary artery bypass graft. Left ventricle  cavity is normal in size. Normal global wall motion. Normal diastolic  filling pattern, normal LAP. Calculated EF 54%.  Structurally normal tricuspid valve. No evidence of tricuspid stenosis.  Mild tricuspid regurgitation. No evidence of pulmonary hypertension.  No other significant valvular abnormalities are seen.  Compared to prior study dated 2014: no significant change noted.   Lexiscan (Walking with mod Bruce)Tetrofosmin Stress Test  03/30/2020: Nondiagnostic ECG stress. Converted to walking Lexiscan after 3:00 Bruce attempt due  to dyspnea.  Myocardial perfusion is normal with mild diaphragmatic attenuation in inferior wall. minimal ischemia in this region cannot be excluded.  Overall LV systolic function is normal without regional wall motion abnormalities. Stress LV EF: 57%.  No previous exam available for comparison. Low risk.   Recent labs: 12/01/2020: Glucose 100, BUN/Cr 28/1.50. EGFR 49. Na/K 140/3.2. Rest of the CMP normal H/H 10/29. MCV 91. Platelets 129 HbA1C 5.9% No recent lipid panel  03/19/2020: Glucose 115, BUN/Cr 20/1.44. EGFR 51. Na/K 140/3.8. Rest of the CMP normal H/H 12/36.  MCV 99. Platelets 121  07/14/2017: H/H 13.7/41.  MCV 92.  Platelets 169 Glucose 119, BUN/creatinine 28/1.81.  EGFR 37.  Sodium 140, potassium 4.7  Review of Systems  Cardiovascular:  Negative for chest pain, dyspnea on exertion, leg swelling, palpitations and syncope.      Vitals:   03/19/21 0919  BP: (!) 148/68  Pulse: 66  Resp: 16  Temp: 97.8 F (36.6 C)  SpO2: 99%    Physical Exam: Physical Exam Vitals and nursing note reviewed.  Constitutional:      General: He is not in acute distress. Neck:     Vascular: No JVD.  Cardiovascular:     Rate and Rhythm: Normal rate and regular rhythm.     Heart sounds: Normal heart sounds. No murmur heard. Pulmonary:     Effort: Pulmonary effort is normal.     Breath sounds: Normal breath sounds. No wheezing or rales.          Assessment & Recommendations:  48 -year-old male with coronary artery disease status post CABG 2001 vy Dr. Lucianne Lei Trigt (LIMA-LAD, SVG-Diag, seg SVG-ramus & OM, SVG-PDA), hypertension, CKD III w/IgA nephropathy, hyperlipidemia, rectal cancer, now status post chemoradiation and surgery  CAD: Stable. Recommend aspirin 81 mg, Crestor 20 mg. Check lipid panel.  Hypertension: Elevated today, but usually well controlled at home. No change made today.  CKD 3: F/u w/nephrology.  F/u in 1 year.  Nigel Mormon, MD [facility] Pager: [phone] Office: [phone] If no answer Cell [phone]

## 2021-03-19 ENCOUNTER — Ambulatory Visit: Payer: Medicare Other | Admitting: Cardiology

## 2021-03-19 ENCOUNTER — Other Ambulatory Visit: Payer: Self-pay

## 2021-03-19 ENCOUNTER — Encounter: Payer: Self-pay | Admitting: Cardiology

## 2021-03-19 VITALS — BP 148/68 | HR 66 | Temp 97.8°F | Resp 16 | Ht 68.0 in | Wt 135.0 lb

## 2021-03-19 DIAGNOSIS — I2510 Atherosclerotic heart disease of native coronary artery without angina pectoris: Secondary | ICD-10-CM

## 2021-03-19 DIAGNOSIS — I10 Essential (primary) hypertension: Secondary | ICD-10-CM

## 2021-03-30 ENCOUNTER — Other Ambulatory Visit: Payer: Self-pay | Admitting: Cardiology

## 2021-06-01 ENCOUNTER — Ambulatory Visit (AMBULATORY_SURGERY_CENTER): Payer: Medicare Other | Admitting: *Deleted

## 2021-06-01 ENCOUNTER — Other Ambulatory Visit: Payer: Self-pay

## 2021-06-01 VITALS — Ht 68.0 in | Wt 140.0 lb

## 2021-06-01 DIAGNOSIS — Z85038 Personal history of other malignant neoplasm of large intestine: Secondary | ICD-10-CM

## 2021-06-01 NOTE — Progress Notes (Signed)
No egg or soy allergy known to patient  No issues known to pt with past sedation with any surgeries or procedures Patient denies ever being told they had issues or difficulty with intubation  No FH of Malignant Hyperthermia Pt is not on diet pills Pt is not on  home 02  Pt is not on blood thinners  Pt denies issues with constipation  No A fib or A flutter  Pt is fully vaccinated  for Covid    Due to the COVID-19 pandemic we are asking patients to follow certain guidelines in [place] and the [place]   Pt aware of COVID protocols and [facility] guidelines    OVER THE COUNTER SAMPLE PAGE SENT WITH INSTRUCTIONS.PT. DENIES PROBLEMS WITH CONSTIPATION.

## 2021-06-02 ENCOUNTER — Other Ambulatory Visit: Payer: Self-pay | Admitting: Cardiology

## 2021-06-02 DIAGNOSIS — I2510 Atherosclerotic heart disease of native coronary artery without angina pectoris: Secondary | ICD-10-CM

## 2021-06-08 ENCOUNTER — Encounter: Payer: Self-pay | Admitting: Gastroenterology

## 2021-06-08 ENCOUNTER — Other Ambulatory Visit: Payer: Self-pay

## 2021-06-08 ENCOUNTER — Ambulatory Visit (AMBULATORY_SURGERY_CENTER): Payer: Medicare Other | Admitting: Gastroenterology

## 2021-06-08 VITALS — BP 130/60 | HR 59 | Temp 98.2°F | Resp 12 | Ht 63.0 in | Wt 140.0 lb

## 2021-06-08 DIAGNOSIS — Z85038 Personal history of other malignant neoplasm of large intestine: Secondary | ICD-10-CM

## 2021-06-08 DIAGNOSIS — Z86010 Personal history of colonic polyps: Secondary | ICD-10-CM

## 2021-06-08 DIAGNOSIS — D128 Benign neoplasm of rectum: Secondary | ICD-10-CM | POA: Diagnosis not present

## 2021-06-08 DIAGNOSIS — K629 Disease of anus and rectum, unspecified: Secondary | ICD-10-CM

## 2021-06-08 MED ORDER — SODIUM CHLORIDE 0.9 % IV SOLN: 500.0000 mL | Freq: Once | INTRAVENOUS | Status: DC

## 2021-06-08 NOTE — Progress Notes (Signed)
Called to room to assist during endoscopic procedure.  Patient ID and intended procedure confirmed with present staff. Received instructions for my participation in the procedure from the performing physician.  

## 2021-06-08 NOTE — Progress Notes (Signed)
GASTROENTEROLOGY PROCEDURE H&P NOTE   Primary Care Physician: [name], MD  HPI: Jared White is a 76 y.o. male who presents for Colonoscopy for high risk surveillance with history of previous colorectal cancer.  Past Medical History:  Diagnosis Date   Arthritis    Blood transfusion without reported diagnosis    Broken back    CAD (coronary artery disease)    PCI & CABG   Cataract    "LIGHT"-RIGHT   CHF (congestive heart failure) ([place])    Diverticulitis    mild - approx 2004   Dysrhythmia    occasional arrythmias   Family history of heart disease    GERD (gastroesophageal reflux disease)    Heart murmur    HX OF YEARS AGO    History of nuclear stress test 06/30/2011   lexiscan; normal pattern of perfusion post-stress; low risk scan    Hyperlipidemia    Hypertension    IgA nephropathy    Irregular heart beat    Myocardial infarction ([place])    Pre-diabetes    Rectal cancer ([place]) 08/19/2019   Systemic hypertension    Past Surgical History:  Procedure Laterality Date   CARDIAC CATHETERIZATION  08/22/1995   PTCA of OM (Dr. Marella Chimes)   COLONOSCOPY     CORONARY ARTERY BYPASS GRAFT  01/22/2000   x5 - LIMA to LAD, SVG to diagonal, sequential SVG to ramus & OM, SVG to PDA (Dr. Tharon Aquas Trigt)   DIVERTING ILEOSTOMY N/A 06/10/2020   Procedure: DIVERTING LOOP ILEOSTOMY;  Surgeon: Leighton Ruff, MD;  Location: [facility];  Service: General;  Laterality: N/A;   ILEOSTOMY CLOSURE N/A 10/09/2020   Procedure: LOOP ILEOSTOMY REVERSAL;  Surgeon: Leighton Ruff, MD;  Location: [facility];  Service: General;  Laterality: N/A;   IR IMAGING GUIDED PORT INSERTION  09/04/2019   LUNG LOBECTOMY     left upper lobe   TRANSTHORACIC ECHOCARDIOGRAM  12/24/2012   EF 55-60%, mild LVH, mild conc hypertrophy; mild MR; LA mildly dilated   XI ROBOTIC ASSISTED LOWER ANTERIOR RESECTION N/A 06/10/2020   Procedure: XI ROBOTIC ASSISTED LOWER ANTERIOR RESECTION, MOBILIZATION OF SPLENIC FLEXURE,  RIGID PROCTOSCOPY;  Surgeon: Leighton Ruff, MD;  Location: [facility];  Service: General;  Laterality: N/A;   Current Outpatient Medications  Medication Sig Dispense Refill   ascorbic acid (VITAMIN C) 500 MG tablet Take 500 mg by mouth daily.     aspirin 81 MG EC tablet Take 81 mg by mouth daily.     atenolol (TENORMIN) 50 MG tablet TAKE 1 TABLET BY MOUTH EVERY DAY 90 tablet 3   Calcium 600-200 MG-UNIT tablet Take 1 tablet by mouth daily.     cholecalciferol (VITAMIN D3) 25 MCG (1000 UNIT) tablet Take 1,000 Units by mouth daily.     loperamide (IMODIUM) 2 MG capsule Take 1 capsule (2 mg total) by mouth 4 (four) times daily as needed. 60 capsule 2   Magnesium 400 MG TABS Take 400 mg by mouth daily.     omeprazole (PRILOSEC) 40 MG capsule TAKE 1 CAPSULE (40 MG TOTAL) BY MOUTH DAILY. 30 capsule 3   rosuvastatin (CRESTOR) 20 MG tablet TAKE 1 TABLET BY MOUTH EVERY DAY 90 tablet 3   valsartan (DIOVAN) 160 MG tablet TAKE 1 TABLET BY MOUTH EVERY DAY 90 tablet 3   vitamin B-12 (CYANOCOBALAMIN) 50 MCG tablet Take 50 mcg by mouth daily.     vitamin E 45 MG (100 UNITS) capsule Take 100 Units by mouth  daily.     nitroGLYCERIN (NITROSTAT) 0.4 MG SL tablet Place 0.4 mg under the tongue every 5 (five) minutes x 3 doses as needed for chest pain. (Patient not taking: Reported on 06/01/2021)     Current Facility-Administered Medications  Medication Dose Route Frequency Provider Last Rate Last Admin   0.9 %  sodium chloride infusion  500 mL Intravenous Once Pyrtle, Lajuan Lines, MD        Current Outpatient Medications:    ascorbic acid (VITAMIN C) 500 MG tablet, Take 500 mg by mouth daily., Disp: , Rfl:    aspirin 81 MG EC tablet, Take 81 mg by mouth daily., Disp: , Rfl:    atenolol (TENORMIN) 50 MG tablet, TAKE 1 TABLET BY MOUTH EVERY DAY, Disp: 90 tablet, Rfl: 3   Calcium 600-200 MG-UNIT tablet, Take 1 tablet by mouth daily., Disp: , Rfl:    cholecalciferol (VITAMIN D3) 25 MCG (1000 UNIT) tablet, Take 1,000 Units by  mouth daily., Disp: , Rfl:    loperamide (IMODIUM) 2 MG capsule, Take 1 capsule (2 mg total) by mouth 4 (four) times daily as needed., Disp: 60 capsule, Rfl: 2   Magnesium 400 MG TABS, Take 400 mg by mouth daily., Disp: , Rfl:    omeprazole (PRILOSEC) 40 MG capsule, TAKE 1 CAPSULE (40 MG TOTAL) BY MOUTH DAILY., Disp: 30 capsule, Rfl: 3   rosuvastatin (CRESTOR) 20 MG tablet, TAKE 1 TABLET BY MOUTH EVERY DAY, Disp: 90 tablet, Rfl: 3   valsartan (DIOVAN) 160 MG tablet, TAKE 1 TABLET BY MOUTH EVERY DAY, Disp: 90 tablet, Rfl: 3   vitamin B-12 (CYANOCOBALAMIN) 50 MCG tablet, Take 50 mcg by mouth daily., Disp: , Rfl:    vitamin E 45 MG (100 UNITS) capsule, Take 100 Units by mouth daily., Disp: , Rfl:    nitroGLYCERIN (NITROSTAT) 0.4 MG SL tablet, Place 0.4 mg under the tongue every 5 (five) minutes x 3 doses as needed for chest pain. (Patient not taking: Reported on 06/01/2021), Disp: , Rfl:   Current Facility-Administered Medications:    0.9 %  sodium chloride infusion, 500 mL, Intravenous, Once, Pyrtle, Lajuan Lines, MD No Known Allergies Family History  Problem Relation Age of Onset   Heart attack Father    Kidney failure Brother    Prostate cancer Brother    Heart attack Brother    Heart disease Brother    Hypertension Brother    Heart disease Brother    Hypertension Brother    Colon cancer Neg Hx    Esophageal cancer Neg Hx    Rectal cancer Neg Hx    Stomach cancer Neg Hx    Inflammatory bowel disease Neg Hx    Liver disease Neg Hx    Pancreatic cancer Neg Hx    Colon polyps Neg Hx    Social History   Socioeconomic History   Marital status: Single    Spouse name: n/a   Number of children: 0   Years of education: Not on file   Highest education level: Not on file  Occupational History   Occupation: Chief Strategy Officer, [employer]  Tobacco Use   Smoking status: Never   Smokeless tobacco: Never  Vaping Use   Vaping Use: Never used  Substance and Sexual Activity   Alcohol use: Never    Drug use: No   Sexual activity: Not on file  Other Topics Concern   Not on file  Social History Narrative   Lives alone.   Sister-in-Law lives next door.  Social Determinants of Health   Financial Resource Strain: Not on file  Food Insecurity: Not on file  Transportation Needs: Not on file  Physical Activity: Not on file  Stress: Not on file  Social Connections: Not on file  Intimate Partner Violence: Not on file    Physical Exam: Today's Vitals   06/08/21 0700 06/08/21 0756  BP: (!) 149/75 (!) 147/71  Pulse: 69 (!) 54  Resp:  (!) 28  Temp: 98.2 F (36.8 C)   TempSrc: Temporal   SpO2: 96% 99%  Weight: 140 lb (63.5 kg)   Height: 5\' 3"  (1.6 m)    Body mass index is 24.8 kg/m. GEN: NAD EYE: Sclerae anicteric ENT: MMM CV: Non-tachycardic GI: Soft, NT/ND NEURO:  Alert & Oriented x 3  Lab Results: No results for input(s): WBC, HGB, HCT, PLT in the last 72 hours. BMET No results for input(s): NA, K, CL, CO2, GLUCOSE, BUN, CREATININE, CALCIUM in the last 72 hours. LFT No results for input(s): PROT, ALBUMIN, AST, ALT, ALKPHOS, BILITOT, BILIDIR, IBILI in the last 72 hours. PT/INR No results for input(s): LABPROT, INR in the last 72 hours.   Impression / Plan: This is a 76 y.o.male who presents for Colonoscopy for high risk surveillance with history of previous colorectal cancer.  The risks and benefits of endoscopic evaluation/treatment were discussed with the patient and/or family; these include but are not limited to the risk of perforation, infection, bleeding, missed lesions, lack of diagnosis, severe illness requiring hospitalization, as well as anesthesia and sedation related illnesses.  The patient's history has been reviewed, patient examined, no change in status, and deemed stable for procedure.  The patient and/or family is agreeable to proceed.    Justice Britain, MD [facility] Office # [phone]

## 2021-06-08 NOTE — Progress Notes (Signed)
Report given to PACU, vss 

## 2021-06-08 NOTE — Patient Instructions (Signed)
Handouts Provided:  Diverticulosis  YOU HAD AN ENDOSCOPIC PROCEDURE TODAY AT [facility]:   Refer to the procedure report that was given to you for any specific questions about what was found during the examination.  If the procedure report does not answer your questions, please call your gastroenterologist to clarify.  If you requested that your care partner not be given the details of your procedure findings, then the procedure report has been included in a sealed envelope for you to review at your convenience later.  YOU SHOULD EXPECT: Some feelings of bloating in the abdomen. Passage of more gas than usual.  Walking can help get rid of the air that was put into your GI tract during the procedure and reduce the bloating. If you had a lower endoscopy (such as a colonoscopy or flexible sigmoidoscopy) you may notice spotting of blood in your stool or on the toilet paper. If you underwent a bowel prep for your procedure, you may not have a normal bowel movement for a few days.  Please Note:  You might notice some irritation and congestion in your nose or some drainage.  This is from the oxygen used during your procedure.  There is no need for concern and it should clear up in a day or so.  SYMPTOMS TO REPORT IMMEDIATELY:  Following lower endoscopy (colonoscopy or flexible sigmoidoscopy):  Excessive amounts of blood in the stool  Significant tenderness or worsening of abdominal pains  Swelling of the abdomen that is new, acute  Fever of 100F or higher  For urgent or emergent issues, a gastroenterologist can be reached at any hour by calling [phone]. Do not use MyChart messaging for urgent concerns.    DIET:  We do recommend a small meal at first, but then you may proceed to your regular diet.  Drink plenty of fluids but you should avoid alcoholic beverages for 24 hours.  ACTIVITY:  You should plan to take it easy for the rest of today and you should NOT DRIVE or use heavy  machinery until tomorrow (because of the sedation medicines used during the test).    FOLLOW UP: Our staff will call the number listed on your records 48-72 hours following your procedure to check on you and address any questions or concerns that you may have regarding the information given to you following your procedure. If we do not reach you, we will leave a message.  We will attempt to reach you two times.  During this call, we will ask if you have developed any symptoms of COVID 19. If you develop any symptoms (ie: fever, flu-like symptoms, shortness of breath, cough etc.) before then, please call [phone].  If you test positive for Covid 19 in the 2 weeks post procedure, please call and report this information to [place].    If any biopsies were taken you will be contacted by phone or by letter within the next 1-3 weeks.  Please call us at [phone] if you have not heard about the biopsies in 3 weeks.    SIGNATURES/CONFIDENTIALITY: You and/or your care partner have signed paperwork which will be entered into your electronic medical record.  These signatures attest to the fact that that the information above on your After Visit Summary has been reviewed and is understood.  Full responsibility of the confidentiality of this discharge information lies with you and/or your care-partner.

## 2021-06-08 NOTE — Op Note (Addendum)
[facility] Patient Name: Jared White Procedure Date: 06/08/2021 7:55 AM MRN: [id number] Endoscopist: Justice Britain , MD Age: 76 Referring MD:  Date of Birth: [birth year] Gender: Male Account #: [id number] Procedure:                Colonoscopy Indications:              High risk colorectal cancer surveillance and s/p                            LAR in 2022: Personal history of adenoma (10 mm or                            greater in size), High risk colon cancer                            surveillance: Personal history of colon cancer Medicines:                Monitored Anesthesia Care Procedure:                Pre-Anesthesia Assessment:                           - Prior to the procedure, a History and Physical                            was performed, and patient medications and                            allergies were reviewed. The patient's tolerance of                            previous anesthesia was also reviewed. The risks                            and benefits of the procedure and the sedation                            options and risks were discussed with the patient.                            All questions were answered, and informed consent                            was obtained. Prior Anticoagulants: The patient has                            taken no previous anticoagulant or antiplatelet                            agents. ASA Grade Assessment: III - A patient with                            severe systemic disease. After reviewing the risks  and benefits, the patient was deemed in                            satisfactory condition to undergo the procedure.                           After obtaining informed consent, the colonoscope                            was passed under direct vision. Throughout the                            procedure, the patient's blood pressure, pulse, and                            oxygen saturations  were monitored continuously. The                            CF [id number] #[id number] was introduced through the anus                            and advanced to the the cecum, identified by                            appendiceal orifice and ileocecal valve. The                            colonoscopy was performed without difficulty. The                            patient tolerated the procedure. The quality of the                            bowel preparation was fair. The ileocecal valve,                            appendiceal orifice, and rectum were photographed.                            The Endoscope was introduced through the anus and                            advanced to the the sigmoid colon. Scope In: 8:05:14 AM Scope Out: 8:28:20 AM Scope Withdrawal Time: 0 hours 20 minutes 19 seconds  Total Procedure Duration: 0 hours 23 minutes 6 seconds  Findings:                 Skin tags were found on perianal exam.                           External hemorrhoids were found on perianal exam.                           The digital rectal exam findings include palpable  rectal lesion vs anastomosis though nodularity was                            felt as well.                           Extensive amounts of semi-liquid stool was found in                            the entire colon, interfering with visualization.                            Lavage of the area was performed using copious                            amounts, resulting in clearance with only fair                            visualization.                           Multiple small-mouthed diverticula were found in                            the entire colon.                           There was evidence of a prior end-to-end                            low-anterior anastomosis in the mid rectum at                            approximately 6-7 cm from os. This was patent and                            was characterized  by an intact staple line. The                            anastomosis was traversed. Removal of the two                            staples was accomplished with a regular forceps.                           Following distal to the anastomosis however, was a                            frond-like/villous, infiltrative and ulcerated area                            that is partially obstructing from the anal os to 6  cm. The area was partially circumferential                            (involving one-half of the lumen circumference).                            There was some oozing noted, though may be a result                            of scope passage initially. Biopsies were taken                            with a cold forceps for histology purposes to rule                            out recurrent malignancy. Complications:            No immediate complications. Estimated Blood Loss:     Estimated blood loss was minimal. Impression:               - Preparation of the colon was only fair after                            extensive lavage.                           - Perianal skin tags found on perianal exam.                           - External hemorrhoids found on perianal exam.                           - Palpable rectal lesion found on digital rectal                            exam.                           - Diverticulosis in the entire examined colon.                           - Patent end-to-end low-anterior anastomosis,                            characterized by an intact staple line. Two staples                            removed.                           - Rule out malignancy, partially obstructing                            lesion/area in the distal rectum to the anal os.  Biopsied to rule out recurrent maligancy. Recommendation:           - The patient will be observed post-procedure,                            until all  discharge criteria are met.                           - Discharge patient to home.                           - Resume previous diet.                           - Use FiberCon 1-2 tablets PO daily.                           - Continue present medications.                           - Await pathology results.                           - If no evidence of malignancy will need to discuss                            with Colorectal Surgery and Oncology whether                            benefit of repeat biopsies vs monitoring with                            imaging (Pelvic MRI?). Otherwise, would recommend                            then a repeat colonoscopy within next 3-6 because                            the bowel preparation was poor.                           - The findings and recommendations were discussed                            with the patient.                           - The findings and recommendations were discussed                            with the designated responsible adult. Justice Britain, MD 06/08/2021 8:42:34 AM

## 2021-06-08 NOTE — Progress Notes (Signed)
Pt's states no medical or surgical changes since previsit or office visit. 

## 2021-06-10 ENCOUNTER — Telehealth: Payer: Self-pay

## 2021-06-10 NOTE — Telephone Encounter (Signed)
°  Follow up Call-  Call back number 06/08/2021 08/13/2019  Post procedure Call Back phone  # [phone]  Permission to leave phone message Yes Yes  Some recent data might be hidden     Patient questions:  Do you have a fever, pain , or abdominal swelling? No. Pain Score  0 *  Have you tolerated food without any problems? Yes.    Have you been able to return to your normal activities? Yes.    Do you have any questions about your discharge instructions: Diet   No. Medications  No. Follow up visit  No.  Do you have questions or concerns about your Care? No.  Actions: * If pain score is 4 or above: No action needed, pain <4.  Have you developed a fever since your procedure? no  2.   Have you had an respiratory symptoms (SOB or cough) since your procedure? no  3.   Have you tested positive for COVID 19 since your procedure no  4.   Have you had any family members/close contacts diagnosed with the COVID 19 since your procedure?  no   If yes to any of these questions please route to Joylene John, RN and Joella Prince, RN no

## 2021-06-11 ENCOUNTER — Ambulatory Visit (HOSPITAL_COMMUNITY)
Admission: RE | Admit: 2021-06-11 | Discharge: 2021-06-11 | Disposition: A | Discharge status: Home / Self Care | Payer: Medicare Other | Source: Ambulatory Visit | Attending: Oncology | Admitting: Oncology

## 2021-06-11 ENCOUNTER — Encounter: Payer: Self-pay | Admitting: Gastroenterology

## 2021-06-11 ENCOUNTER — Other Ambulatory Visit: Payer: Self-pay

## 2021-06-11 DIAGNOSIS — C20 Malignant neoplasm of rectum: Secondary | ICD-10-CM

## 2021-06-11 IMAGING — CT CT CHEST W/O CM
2 of 4 series · 13 of 36 positions shown, 16 images · non-contrast
Comparison: Multiple previous studies. The most recent is
[DATE].

CLINICAL DATA: Restaging colorectal cancer. Initial diagnosis [DATE]. History of surgery, chemotherapy and radiation therapy.



[Series 2: cap w/o · axial · non-contrast · 0.92mm/px · z∈[-294,+241]mm · 10 of 131 slices shown, 13 images]
[im 12/131  mediastinal]
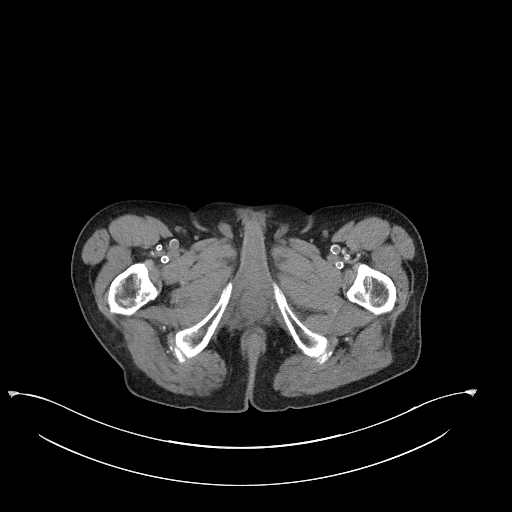
[im 12/131  lung]
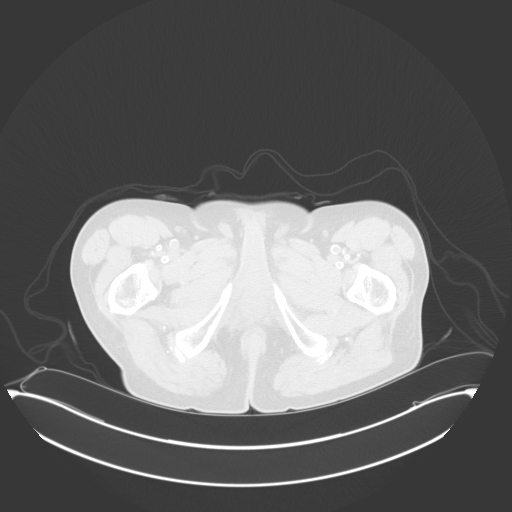
[im 24/131  lung]
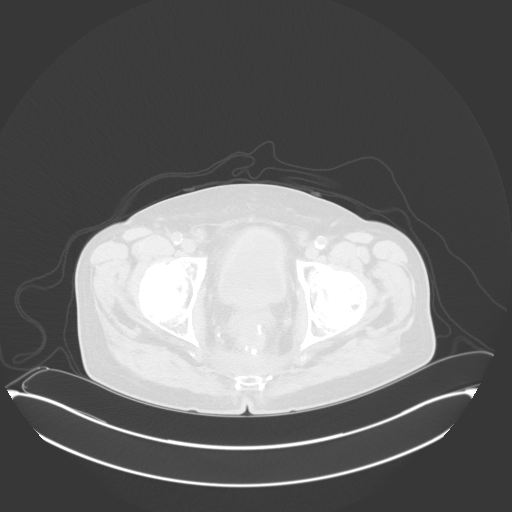
[im 36/131  lung]
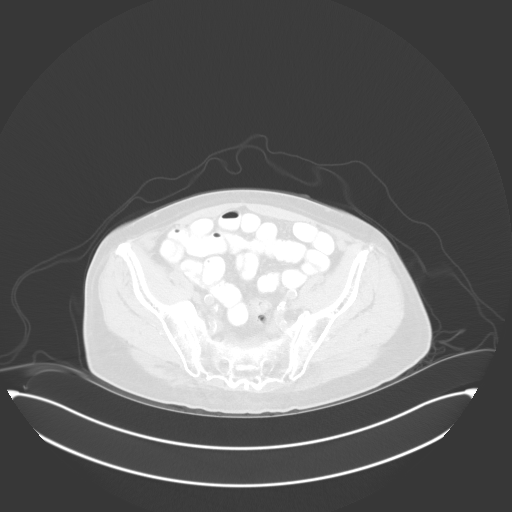
[im 48/131  lung]
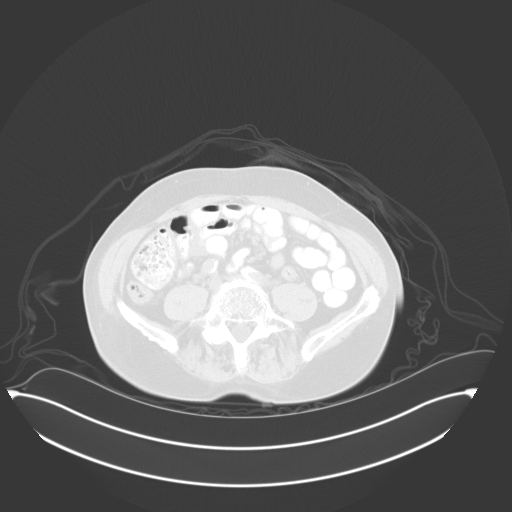
[im 60/131  mediastinal]
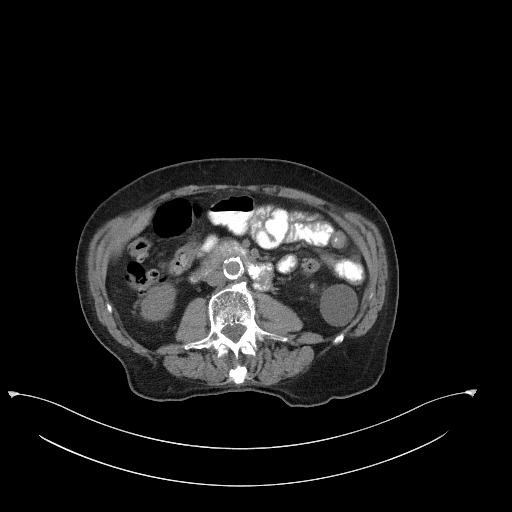
[im 60/131  lung]
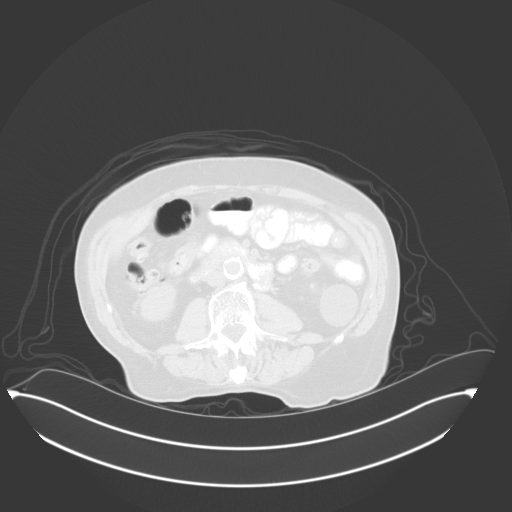
[im 71/131  lung]
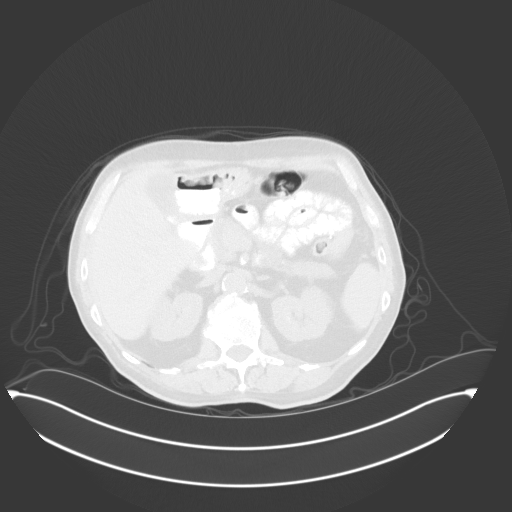
[im 83/131  lung]
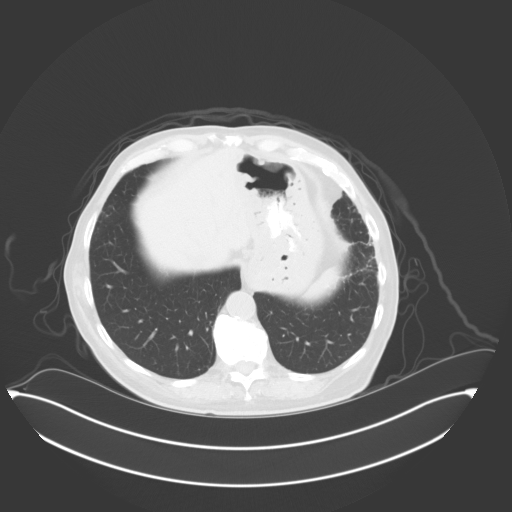
[im 95/131  lung]
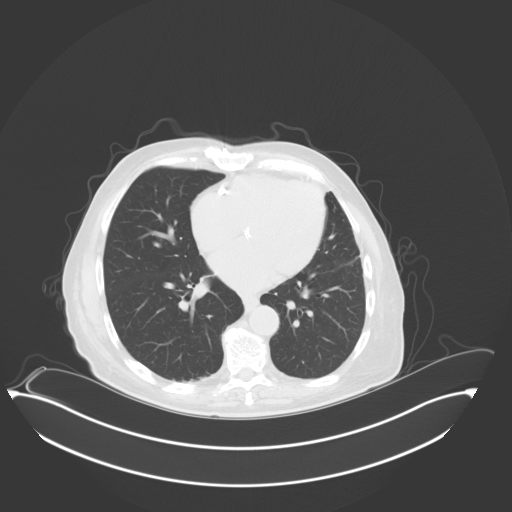
[im 107/131  mediastinal]
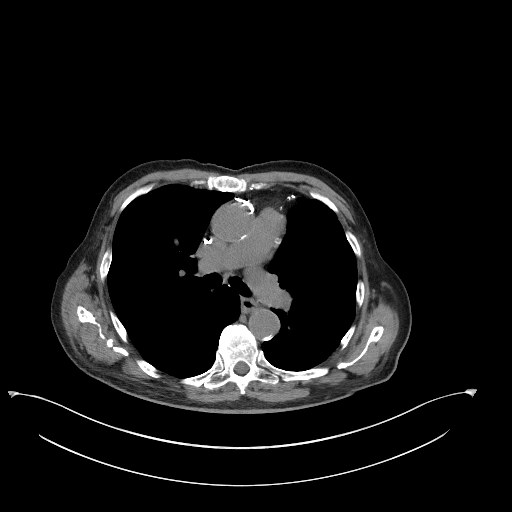
[im 107/131  lung]
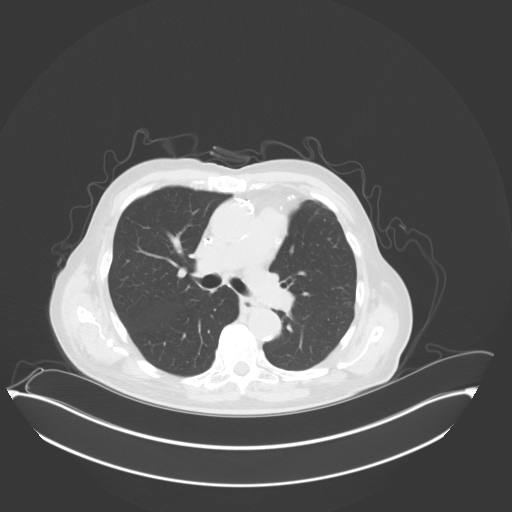
[im 119/131  lung]
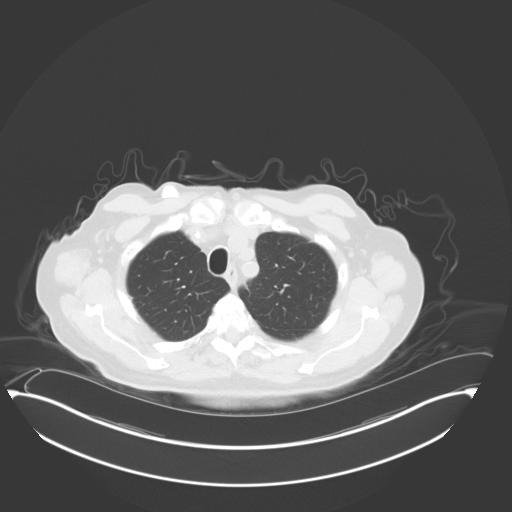

[Series 5: coronals · coronal · 0.89mm/px · 3 of 182 slices shown]
[im 37/182  lung]
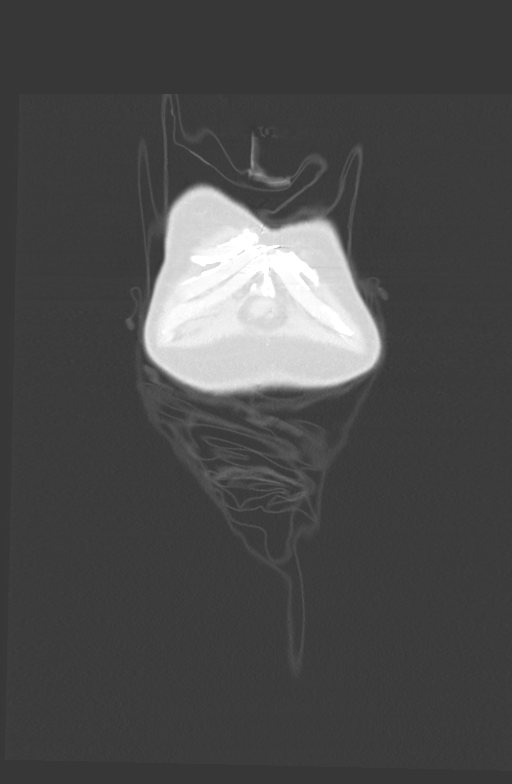
[im 73/182  lung]
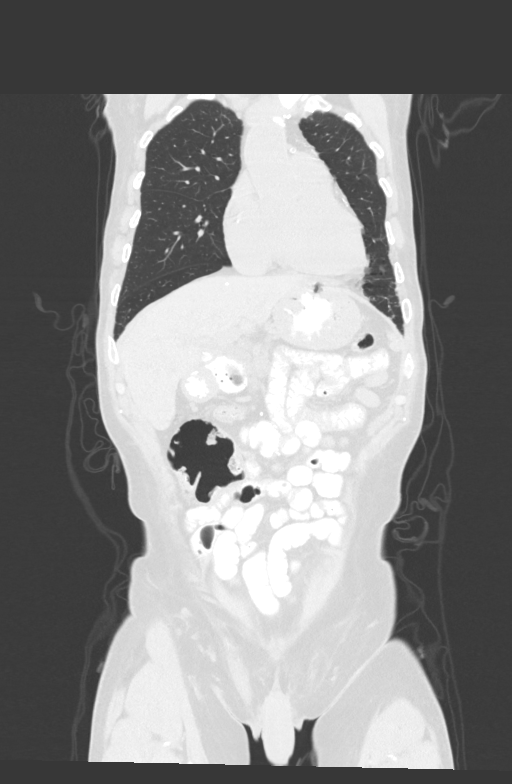
[im 109/182  lung]
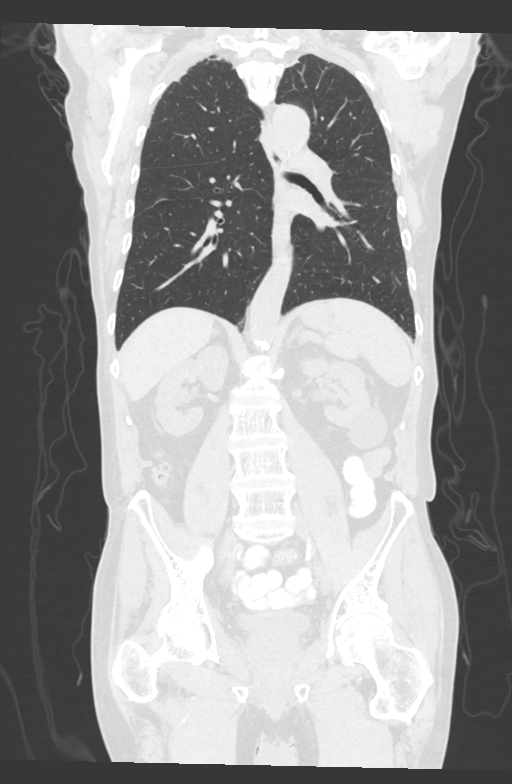

[13 of 36 positions shown; findings below may reference images not displayed]

FINDINGS: CT CHEST FINDINGS

Cardiovascular: The heart is normal in size. No pericardial
effusion. Stable mild tortuosity and atherosclerotic calcification
involving the thoracic aorta. Stable extensive three-vessel coronary
artery calcifications and surgical changes from prior bypass
surgery. The right-sided power port is in good position without
complicating features.

Mediastinum/Nodes: No mediastinal or hilar mass or lymphadenopathy.
The esophagus is grossly normal. There is a small stable hiatal
hernia. The thyroid gland is unremarkable.

Lungs/Pleura: Stable biapical pleural and parenchymal scarring
changes. No worrisome pulmonary lesions. No pulmonary nodules to
suggest pulmonary metastatic disease. No acute pulmonary findings.
Stable lingular and right middle lobe scarring changes with a few
scattered calcifications.

Musculoskeletal: No chest wall mass, supraclavicular or axillary
adenopathy. The bony structures are unremarkable.

CT ABDOMEN PELVIS FINDINGS

Hepatobiliary: No hepatic lesions are identified without contrast.
No intrahepatic biliary dilatation. Stable cholelithiasis. No common
bile duct dilatation.

Pancreas: No mass, inflammation or ductal dilatation.

Spleen: Normal size. No focal lesions.

Adrenals/Urinary Tract: Adrenal glands are unremarkable. Small renal
calculi and bilateral renal cysts. No worrisome renal lesions
without contrast. No hydronephrosis. The bladder is unremarkable.

Stomach/Bowel: The stomach demonstrates fairly significant and
diffuse wall thickening but this is a stable finding. No discrete
mass. The duodenum, small bowel and colon are unremarkable. No acute
inflammatory changes, mass lesions or obstructive findings. Stable
surgical changes with soft tissue thickening in the presacral space.
No findings suspicious for recurrent tumor or new colonic mass.

Vascular/Lymphatic: Stable atherosclerotic calcifications involving
the aorta and branch vessels but no aneurysm. No mesenteric or
retroperitoneal mass or adenopathy.

Reproductive: Stable enlarged prostate gland with median lobe
hypertrophy impressing on the base of the bladder. The seminal
vesicles are grossly normal.

Other: No free abdominal/free pelvic fluid collections. No pelvic
mass or adenopathy. No inguinal adenopathy.

Musculoskeletal: No significant bony findings. No findings
suspicious for metastatic disease. Remote L2 compression fracture
and stable degenerative changes and osteoporosis involving the
spine.
IMPRESSION: 1. Stable postoperative changes with presacral soft tissue density.
No findings suspicious for recurrent tumor, locoregional adenopathy
or metastatic disease elsewhere.
2. Stable diffuse gastric wall thickening.
3. Cholelithiasis.
4. Stable bilateral renal calculi and renal cysts.
5. Stable enlarged prostate gland with median lobe hypertrophy
impressing on the base of the bladder.
6. Stable advanced atherosclerotic calcifications involving the
thoracic and abdominal aorta and branch vessels including the
coronary arteries.

Aortic Atherosclerosis ([O2]-[O2]).

## 2021-06-16 ENCOUNTER — Telehealth: Payer: Self-pay | Admitting: Gastroenterology

## 2021-06-16 NOTE — Telephone Encounter (Signed)
Patient called requesting to speak with a nurse regarding path results.

## 2021-06-16 NOTE — Telephone Encounter (Signed)
Pt called in requesting a copy of his results & documents from procedures. Pt does have MyChart access, however stated he is having difficulty using it. Pt informed that Dr. Rush Landmark has sent him a letter that he should be receiving in the mail soon, and a copy of his procedure scans will be sent as well.

## 2021-06-21 ENCOUNTER — Encounter: Payer: Self-pay | Admitting: Family Medicine

## 2021-06-21 ENCOUNTER — Encounter: Payer: Self-pay | Admitting: Gastroenterology

## 2021-06-21 ENCOUNTER — Encounter: Payer: Self-pay | Admitting: Oncology

## 2021-06-21 ENCOUNTER — Telehealth: Payer: Self-pay

## 2021-06-21 NOTE — Telephone Encounter (Signed)
I had called and spoken with him about all of these results as soon as I had gotten them.  So he knew that we were waiting to have a discussion with Dr. Alen Blew and Dr. Marcello Moores to see next steps from there but he was aware of the biopsy findings and I went in great detail with him about those previously.  Thanks. GM

## 2021-06-21 NOTE — Telephone Encounter (Signed)
Patty, I have forwarded you a result note discussion that Dr. Marcello Moores and Dr. Alen Blew and I had had. Dr. Marcello Moores wants to see the patient to see if there is any way that a transanal excision could be performed. Please go ahead and copy that message on this particular telephone/patient chart note. Please let him know that if he has not heard back from their clinic about getting something scheduled by the end of this week to reach back out to [place]. Please make sure that we mail him his results letter. Thanks. GM

## 2021-07-06 NOTE — Progress Notes (Signed)
Please place orders for PAT appointment scheduled 07/07/21.

## 2021-07-06 NOTE — Progress Notes (Addendum)
COVID swab appointment: n/a  COVID Vaccine Completed: yes x3 Date COVID Vaccine completed: 06/24/19, 07/22/19 Has received booster 01/17/20 COVID vaccine manufacturer: [employer]      Date of COVID positive in last 90 days: n/a  PCP - [name], MD Cardiologist - Vernell Leep, MD  Chest x-ray - CT 06/11/21 Epic EKG - 03/19/21 Epic Stress Test - 03/30/20 Epic ECHO - 03/25/20 Epic Cardiac Cath - 2001 Pacemaker/ICD device last checked: n/a Spinal Cord Stimulator: n/a  Bowel Prep - no  Sleep Study -  CPAP -   Fasting Blood Sugar - pre DM, no checks at home Checks Blood Sugar _____ times a day  Blood Thinner Instructions: ASA 325, do not hold Aspirin Instructions: Last Dose:  Activity level: Can go up a flight of stairs and perform activities of daily living without stopping and without symptoms of chest pain or shortness of breath.      Anesthesia review: CHF, pre DM, CABG x4, CKD, barrets esophagus, HTN, CAD  Patient denies shortness of breath, fever, cough and chest pain at PAT appointment   Patient verbalized understanding of instructions that were given to them at the PAT appointment. Patient was also instructed that they will need to review over the PAT instructions again at home before surgery.

## 2021-07-06 NOTE — Patient Instructions (Addendum)
DUE TO COVID-19 ONLY ONE VISITOR IS ALLOWED TO COME WITH YOU AND STAY IN THE WAITING ROOM ONLY DURING PRE OP AND PROCEDURE.   **NO VISITORS ARE ALLOWED IN THE SHORT STAY AREA OR RECOVERY ROOM!!**       Your procedure is scheduled on: 07/09/21   Report to [facility] Main Entrance    Report to admitting at 9:45 AM   Call this number if you have problems the morning of surgery [phone]   Do not eat food :After Midnight.   May have liquids until 9:00 AM day of surgery  CLEAR LIQUID DIET  Foods Allowed                                                                     Foods Excluded  Water, Black Coffee and tea, regular and decaf                             liquids that you cannot  Plain Jell-O in any flavor  (No red)                                           see through such as: Fruit ices (not with fruit pulp)                                     milk, soups, orange juice              Iced Popsicles (No red)                                    All solid food                                   Apple juices Sports drinks like Gatorade (No red) Lightly seasoned clear broth or consume(fat free) Sugar    FOLLOW BOWEL PREP AND ANY ADDITIONAL PRE OP INSTRUCTIONS YOU RECEIVED FROM YOUR SURGEON'S OFFICE!!!     Oral Hygiene is also important to reduce your risk of infection.                                    Remember - BRUSH YOUR TEETH THE MORNING OF SURGERY WITH YOUR REGULAR TOOTHPASTE   Stop all vitamins and supplements 7 days before surgery   Take these medicines the morning of surgery with A SIP OF WATER: Atenolol, Omeprazole, Rosuvastatin                               You may not have any metal on your body including jewelry, and body piercing             Do not wear lotions, powders, cologne, or deodorant  Men may shave face and neck.   Do not bring valuables to the hospital. [place].   Contacts,  dentures or bridgework may not be worn into surgery.    Patients discharged on the day of surgery will not be allowed to drive home.  Someone needs to stay with you for the first 24 hours after anesthesia.              Please read over the following fact sheets you were given: IF YOU HAVE QUESTIONS ABOUT YOUR PRE-OP INSTRUCTIONS PLEASE CALL [facility] - Preparing for Surgery Before surgery, you can play an important role.  Because skin is not sterile, your skin needs to be as free of germs as possible.  You can reduce the number of germs on your skin by washing with CHG (chlorahexidine gluconate) soap before surgery.  CHG is an antiseptic cleaner which kills germs and bonds with the skin to continue killing germs even after washing. Please DO NOT use if you have an allergy to CHG or antibacterial soaps.  If your skin becomes reddened/irritated stop using the CHG and inform your nurse when you arrive at Short Stay. Do not shave (including legs and underarms) for at least 48 hours prior to the first CHG shower.  You may shave your face/neck.  Please follow these instructions carefully:  1.  Shower with CHG Soap the night before surgery and the  morning of surgery.  2.  If you choose to wash your hair, wash your hair first as usual with your normal  shampoo.  3.  After you shampoo, rinse your hair and body thoroughly to remove the shampoo.                             4.  Use CHG as you would any other liquid soap.  You can apply chg directly to the skin and wash.  Gently with a scrungie or clean washcloth.  5.  Apply the CHG Soap to your body ONLY FROM THE NECK DOWN.   Do   not use on face/ open                           Wound or open sores. Avoid contact with eyes, ears mouth and   genitals (private parts).                       Wash face,  Genitals (private parts) with your normal soap.             6.  Wash thoroughly, paying special attention to the area where your     surgery  will be performed.  7.  Thoroughly rinse your body with warm water from the neck down.  8.  DO NOT shower/wash with your normal soap after using and rinsing off the CHG Soap.                9.  Pat yourself dry with a clean towel.            10.  Wear clean pajamas.            11.  Place clean sheets on your bed the night of your first shower and do not  sleep with pets. Day of Surgery : Do not apply any lotions/deodorants the morning of surgery.  Please wear clean clothes to the hospital/surgery center.  FAILURE TO FOLLOW THESE INSTRUCTIONS MAY RESULT IN THE CANCELLATION OF YOUR SURGERY  PATIENT SIGNATURE_________________________________  NURSE SIGNATURE__________________________________  ________________________________________________________________________  [facility] - Preparing for Surgery Before surgery, you can play an important role.  Because skin is not sterile, your skin needs to be as free of germs as possible.  You can reduce the number of germs on your skin by washing with CHG (chlorahexidine gluconate) soap before surgery.  CHG is an antiseptic cleaner which kills germs and bonds with the skin to continue killing germs even after washing. Please DO NOT use if you have an allergy to CHG or antibacterial soaps.  If your skin becomes reddened/irritated stop using the CHG and inform your nurse when you arrive at Short Stay. Do not shave (including legs and underarms) for at least 48 hours prior to the first CHG shower.  You may shave your face/neck.  Please follow these instructions carefully:  1.  Shower with CHG Soap the night before surgery and the  morning of surgery.  2.  If you choose to wash your hair, wash your hair first as usual with your normal  shampoo.  3.  After you shampoo, rinse your hair and body thoroughly to remove the shampoo.                             4.  Use CHG as you would any other liquid soap.  You can apply chg directly to the skin and  wash.  Gently with a scrungie or clean washcloth.  5.  Apply the CHG Soap to your body ONLY FROM THE NECK DOWN.   Do   not use on face/ open                           Wound or open sores. Avoid contact with eyes, ears mouth and   genitals (private parts).                       Wash face,  Genitals (private parts) with your normal soap.             6.  Wash thoroughly, paying special attention to the area where your    surgery  will be performed.  7.  Thoroughly rinse your body with warm water from the neck down.  8.  DO NOT shower/wash with your normal soap after using and rinsing off the CHG Soap.                9.  Pat yourself dry with a clean towel.            10.  Wear clean pajamas.            11.  Place clean sheets on your bed the night of your first shower and do not  sleep with pets. Day of Surgery : Do not apply any lotions/deodorants the morning of surgery.  Please wear clean clothes to the hospital/surgery center.  FAILURE TO FOLLOW THESE INSTRUCTIONS MAY RESULT IN THE CANCELLATION OF YOUR SURGERY  PATIENT SIGNATURE_________________________________  NURSE SIGNATURE__________________________________  ________________________________________________________________________

## 2021-07-07 ENCOUNTER — Ambulatory Visit: Payer: Self-pay | Admitting: General Surgery

## 2021-07-07 ENCOUNTER — Encounter (HOSPITAL_COMMUNITY): Payer: Self-pay

## 2021-07-07 ENCOUNTER — Other Ambulatory Visit: Payer: Self-pay

## 2021-07-07 ENCOUNTER — Encounter (HOSPITAL_COMMUNITY)
Admission: RE | Admit: 2021-07-07 | Discharge: 2021-07-07 | Disposition: A | Discharge status: Home / Self Care | Payer: Medicare Other | Source: Ambulatory Visit | Attending: General Surgery | Admitting: General Surgery

## 2021-07-07 VITALS — BP 164/88 | HR 63 | Temp 98.7°F | Resp 14 | Ht 68.0 in | Wt 137.2 lb

## 2021-07-07 DIAGNOSIS — N183 Chronic kidney disease, stage 3 unspecified: Secondary | ICD-10-CM | POA: Diagnosis not present

## 2021-07-07 DIAGNOSIS — I2510 Atherosclerotic heart disease of native coronary artery without angina pectoris: Secondary | ICD-10-CM

## 2021-07-07 DIAGNOSIS — Z01812 Encounter for preprocedural laboratory examination: Secondary | ICD-10-CM | POA: Insufficient documentation

## 2021-07-07 DIAGNOSIS — R7303 Prediabetes: Secondary | ICD-10-CM | POA: Insufficient documentation

## 2021-07-07 DIAGNOSIS — K621 Rectal polyp: Secondary | ICD-10-CM | POA: Diagnosis not present

## 2021-07-07 DIAGNOSIS — I509 Heart failure, unspecified: Secondary | ICD-10-CM | POA: Diagnosis not present

## 2021-07-07 DIAGNOSIS — Z85048 Personal history of other malignant neoplasm of rectum, rectosigmoid junction, and anus: Secondary | ICD-10-CM | POA: Insufficient documentation

## 2021-07-07 DIAGNOSIS — I130 Hypertensive heart and chronic kidney disease with heart failure and stage 1 through stage 4 chronic kidney disease, or unspecified chronic kidney disease: Secondary | ICD-10-CM | POA: Diagnosis not present

## 2021-07-07 DIAGNOSIS — Z951 Presence of aortocoronary bypass graft: Secondary | ICD-10-CM | POA: Insufficient documentation

## 2021-07-07 HISTORY — DX: Personal history of urinary calculi: Z87.442

## 2021-07-07 LAB — BASIC METABOLIC PANEL
Anion gap: 5 (ref 5–15)
BUN: 19 mg/dL (ref 8–23)
CO2: 28 mmol/L (ref 22–32)
Calcium: 7 mg/dL — ABNORMAL LOW (ref 8.9–10.3)
Chloride: 108 mmol/L (ref 98–111)
Creatinine, Ser: 0.93 mg/dL (ref 0.61–1.24)
GFR, Estimated: >60 mL/min (ref >60)
Glucose, Bld: 113 mg/dL — ABNORMAL HIGH (ref 70–99)
Potassium: 3.9 mmol/L (ref 3.5–5.1)
Sodium: 141 mmol/L (ref 135–145)

## 2021-07-07 LAB — HEMOGLOBIN A1C
Hgb A1c MFr Bld: 5.7 % — ABNORMAL HIGH (ref 4.8–5.6)
Mean Plasma Glucose: 116.89 mg/dL

## 2021-07-07 LAB — CBC
HCT: 34.2 % — ABNORMAL LOW (ref 39.0–52.0)
Hemoglobin: 11.2 g/dL — ABNORMAL LOW (ref 13.0–17.0)
MCH: 31.6 pg (ref 26.0–34.0)
MCHC: 32.7 g/dL (ref 30.0–36.0)
MCV: 96.6 fL (ref 80.0–100.0)
Platelets: 156 10*3/uL (ref 150–400)
RBC: 3.54 MIL/uL — ABNORMAL LOW (ref 4.22–5.81)
RDW: 13.3 % (ref 11.5–15.5)
WBC: 6.1 10*3/uL (ref 4.0–10.5)
nRBC: 0 % (ref 0.0–0.2)

## 2021-07-07 LAB — GLUCOSE, CAPILLARY: Glucose-Capillary: 110 mg/dL — ABNORMAL HIGH (ref 70–99)

## 2021-07-08 NOTE — Anesthesia Preprocedure Evaluation (Addendum)
Anesthesia Evaluation  Patient identified by MRN, date of birth, ID band Patient awake    Reviewed: Allergy & Precautions, NPO status , Patient's Chart, lab work & pertinent test results, reviewed documented beta blocker date and time   Airway Mallampati: II  TM Distance: >3 FB Neck ROM: Full    Dental no notable dental hx. (+) Poor Dentition, Chipped, Missing, Dental Advisory Given,    Pulmonary  S/p lobectomy   Pulmonary exam normal breath sounds clear to auscultation       Cardiovascular hypertension (153/79 in preop, pt doesnt know where he normally is), Pt. on medications and Pt. on home beta blockers + CAD, + Past MI, + CABG (2001) and +CHF  Normal cardiovascular exam+ Valvular Problems/Murmurs (mild MR) MR  Rhythm:Regular Rate:Normal  Lexiscan (Walking with mod Bruce)Tetrofosmin Stress Test 03/30/2020: - Nondiagnostic ECG stress. Converted to walking Lexiscan after 3:00 Bruce attempt due to dyspnea.  Myocardial perfusion is normal with mild diaphragmatic attenuation in inferior wall. minimal ischemia in this region cannot be excluded.  - Overall LV systolic function is normal without regional wall motion abnormalities. Stress LV EF: 57%.  - No previous exam available for comparison. Low risk.  Echocardiogram 03/23/2020:  - Normal LV systolic function with visual EF 50-55%. Abnormal septal wall motion due to post-operative coronary artery bypass graft. Left ventricle cavity is normal in size. Normal global wall motion. Normal diastolic filling pattern, normal LAP. Calculated EF 54%.  - Structurally normal tricuspid valve. No evidence of tricuspid stenosis. Mild tricuspid regurgitation. No evidence of pulmonary hypertension.  - No other significant valvular abnormalities are seen.  - Compared to prior study dated 2014: no significant change noted.    Neuro/Psych negative neurological ROS  negative psych ROS   GI/Hepatic Neg  liver ROS, GERD  Medicated and Controlled,Hx rectal ca s/p resection 05/2020, ileostomy closure 09/2020   Endo/Other  negative endocrine ROS  Renal/GU Renal diseaseIgA nephropathy, Cr 0.93  negative genitourinary   Musculoskeletal  (+) Arthritis , Osteoarthritis,    Abdominal   Peds  Hematology  (+) Blood dyscrasia, anemia , Hb 11.2, plt 156   Anesthesia Other Findings   Reproductive/Obstetrics negative OB ROS                           Anesthesia Physical Anesthesia Plan  ASA: 3  Anesthesia Plan: MAC   Post-op Pain Management: Tylenol PO (pre-op)*   Induction:   PONV Risk Score and Plan: 2 and Propofol infusion and TIVA  Airway Management Planned: Natural Airway and Simple Face Mask  Additional Equipment: None  Intra-op Plan:   Post-operative Plan:   Informed Consent: I have reviewed the patients History and Physical, chart, labs and discussed the procedure including the risks, benefits and alternatives for the proposed anesthesia with the patient or authorized representative who has indicated his/her understanding and acceptance.       Plan Discussed with: CRNA  Anesthesia Plan Comments:       Anesthesia Quick Evaluation

## 2021-07-08 NOTE — Progress Notes (Signed)
Anesthesia Chart Review:   Case: [id number] Date/Time: 07/09/21 1145   Procedures:      ANAL RECTAL EXAM UNDER ANESTHESIA     BIOPSY RECTAL   Anesthesia type: Monitor Anesthesia Care   Pre-op diagnosis: RECTAL POLYP   [facility]   Surgeons: Leighton Ruff, MD       DISCUSSION: Pt is 76 years old with hx CAD (s/p CABG 2001), CHF, HTN, CKD (stage 3), rectal cancer  VS: BP (!) 164/88    Pulse 63    Temp 37.1 C (Oral)    Resp 14    Ht 5\' 8"  (1.727 m)    Wt 62.2 kg    SpO2 100%    BMI 20.86 kg/m   PROVIDERS: - PCP is [name], MD - Cardiologist is Vernell Leep, MD. Last office visit 03/19/21. 1 year f/u recommended.    LABS: Labs reviewed: Acceptable for surgery. (all labs ordered are listed, but only abnormal results are displayed)  Labs Reviewed  HEMOGLOBIN A1C - Abnormal; Notable for the following components:      Result Value   Hgb A1c MFr Bld 5.7 (*)    All other components within normal limits  BASIC METABOLIC PANEL - Abnormal; Notable for the following components:   Glucose, Bld 113 (*)    Calcium 7.0 (*)    All other components within normal limits  CBC - Abnormal; Notable for the following components:   RBC 3.54 (*)    Hemoglobin 11.2 (*)    HCT 34.2 (*)    All other components within normal limits  GLUCOSE, CAPILLARY - Abnormal; Notable for the following components:   Glucose-Capillary 110 (*)    All other components within normal limits     IMAGES: CT chest/abd/pelvis 06/11/21:  1. Stable postoperative changes with presacral soft tissue density. No findings suspicious for recurrent tumor, locoregional adenopathy or metastatic disease elsewhere. 2. Stable diffuse gastric wall thickening. 3. Cholelithiasis. 4. Stable bilateral renal calculi and renal cysts. 5. Stable enlarged prostate gland with median lobe hypertrophy impressing on the base of the bladder. 6. Stable advanced atherosclerotic calcifications involving the  thoracic and abdominal aorta and branch vessels including the coronary arteries   EKG 03/19/21: NSR   CV: Lexiscan (Walking with mod Bruce)Tetrofosmin Stress Test  03/30/2020: - Nondiagnostic ECG stress. Converted to walking Lexiscan after 3:00 Bruce attempt due to dyspnea.  Myocardial perfusion is normal with mild diaphragmatic attenuation in inferior wall. minimal ischemia in this region cannot be excluded.  - Overall LV systolic function is normal without regional wall motion abnormalities. Stress LV EF: 57%.  - No previous exam available for comparison. Low risk.   Echocardiogram 03/23/2020:  - Normal LV systolic function with visual EF 50-55%. Abnormal septal wall motion due to post-operative coronary artery bypass graft. Left ventricle cavity is normal in size. Normal global wall motion. Normal diastolic filling pattern, normal LAP. Calculated EF 54%.  - Structurally normal tricuspid valve. No evidence of tricuspid stenosis. Mild tricuspid regurgitation. No evidence of pulmonary hypertension.  - No other significant valvular abnormalities are seen.  - Compared to prior study dated 2014: no significant change noted.   Past Medical History:  Diagnosis Date   Arthritis    Blood transfusion without reported diagnosis    Broken back    CAD (coronary artery disease)    PCI & CABG   Cataract    "LIGHT"-RIGHT   CHF (congestive heart failure) ([place])  Diverticulitis    mild - approx 2004   Dysrhythmia    occasional arrythmias   Family history of heart disease    GERD (gastroesophageal reflux disease)    Heart murmur    HX OF YEARS AGO    History of kidney stones    History of nuclear stress test 06/30/2011   lexiscan; normal pattern of perfusion post-stress; low risk scan    Hyperlipidemia    Hypertension    IgA nephropathy    Irregular heart beat    Myocardial infarction ([place])    Pre-diabetes    Rectal cancer ([place]) 08/19/2019   Systemic hypertension     Past Surgical  History:  Procedure Laterality Date   CARDIAC CATHETERIZATION  08/22/1995   PTCA of OM (Dr. Marella Chimes)   COLONOSCOPY     CORONARY ARTERY BYPASS GRAFT  01/22/2000   x5 - LIMA to LAD, SVG to diagonal, sequential SVG to ramus & OM, SVG to PDA (Dr. Tharon Aquas Trigt)   DIVERTING ILEOSTOMY N/A 06/10/2020   Procedure: DIVERTING LOOP ILEOSTOMY;  Surgeon: Leighton Ruff, MD;  [facility];  Service: General;  Laterality: N/A;   ILEOSTOMY CLOSURE N/A 10/09/2020   Procedure: LOOP ILEOSTOMY REVERSAL;  Surgeon: Leighton Ruff, MD;  [facility];  Service: General;  Laterality: N/A;   IR IMAGING GUIDED PORT INSERTION  09/04/2019   LUNG LOBECTOMY     left upper lobe   TRANSTHORACIC ECHOCARDIOGRAM  12/24/2012   EF 55-60%, mild LVH, mild conc hypertrophy; mild MR; LA mildly dilated   XI ROBOTIC ASSISTED LOWER ANTERIOR RESECTION N/A 06/10/2020   Procedure: XI ROBOTIC ASSISTED LOWER ANTERIOR RESECTION, MOBILIZATION OF SPLENIC FLEXURE, RIGID PROCTOSCOPY;  Surgeon: Leighton Ruff, MD;  [facility];  Service: General;  Laterality: N/A;    MEDICATIONS:  ascorbic acid (VITAMIN C) 500 MG tablet   aspirin 325 MG EC tablet   atenolol (TENORMIN) 50 MG tablet   Calcium Carbonate (CALCIUM 500 PO)   cholecalciferol (VITAMIN D3) 25 MCG (1000 UNIT) tablet   loperamide (IMODIUM) 2 MG capsule   Magnesium 250 MG TABS   nitroGLYCERIN (NITROSTAT) 0.4 MG SL tablet   omeprazole (PRILOSEC) 40 MG capsule   rosuvastatin (CRESTOR) 20 MG tablet   valsartan (DIOVAN) 160 MG tablet   vitamin B-12 (CYANOCOBALAMIN) 50 MCG tablet   vitamin E 45 MG (100 UNITS) capsule   No current facility-administered medications for this encounter.    If no changes, I anticipate pt can proceed with surgery as scheduled.   Willeen Cass, PhD, FNP-BC [facility]/Anesthesiology Phone: [phone] 07/08/2021 9:29 AM

## 2021-07-09 ENCOUNTER — Ambulatory Visit (HOSPITAL_COMMUNITY): Payer: Medicare Other | Admitting: Physician Assistant

## 2021-07-09 ENCOUNTER — Other Ambulatory Visit: Payer: Self-pay

## 2021-07-09 ENCOUNTER — Encounter (HOSPITAL_COMMUNITY)
Admission: RE | Disposition: A | Discharge status: Home / Self Care | Payer: Self-pay | Source: Ambulatory Visit | Attending: General Surgery

## 2021-07-09 ENCOUNTER — Ambulatory Visit (HOSPITAL_COMMUNITY)
Admission: RE | Admit: 2021-07-09 | Discharge: 2021-07-09 | Disposition: A | Discharge status: Home / Self Care | Payer: Medicare Other | Source: Ambulatory Visit | Attending: General Surgery | Admitting: General Surgery

## 2021-07-09 ENCOUNTER — Ambulatory Visit (HOSPITAL_BASED_OUTPATIENT_CLINIC_OR_DEPARTMENT_OTHER): Payer: Medicare Other | Admitting: Registered Nurse

## 2021-07-09 ENCOUNTER — Encounter (HOSPITAL_COMMUNITY): Payer: Self-pay | Admitting: General Surgery

## 2021-07-09 DIAGNOSIS — K624 Stenosis of anus and rectum: Secondary | ICD-10-CM | POA: Insufficient documentation

## 2021-07-09 DIAGNOSIS — Z951 Presence of aortocoronary bypass graft: Secondary | ICD-10-CM | POA: Insufficient documentation

## 2021-07-09 DIAGNOSIS — I509 Heart failure, unspecified: Secondary | ICD-10-CM

## 2021-07-09 DIAGNOSIS — Z932 Ileostomy status: Secondary | ICD-10-CM | POA: Insufficient documentation

## 2021-07-09 DIAGNOSIS — Z980 Intestinal bypass and anastomosis status: Secondary | ICD-10-CM | POA: Insufficient documentation

## 2021-07-09 DIAGNOSIS — K621 Rectal polyp: Secondary | ICD-10-CM

## 2021-07-09 DIAGNOSIS — I252 Old myocardial infarction: Secondary | ICD-10-CM | POA: Insufficient documentation

## 2021-07-09 DIAGNOSIS — I2510 Atherosclerotic heart disease of native coronary artery without angina pectoris: Secondary | ICD-10-CM | POA: Insufficient documentation

## 2021-07-09 DIAGNOSIS — C20 Malignant neoplasm of rectum: Secondary | ICD-10-CM | POA: Diagnosis not present

## 2021-07-09 DIAGNOSIS — Z902 Acquired absence of lung [part of]: Secondary | ICD-10-CM | POA: Insufficient documentation

## 2021-07-09 DIAGNOSIS — I110 Hypertensive heart disease with heart failure: Secondary | ICD-10-CM | POA: Diagnosis not present

## 2021-07-09 DIAGNOSIS — K219 Gastro-esophageal reflux disease without esophagitis: Secondary | ICD-10-CM | POA: Insufficient documentation

## 2021-07-09 DIAGNOSIS — K9189 Other postprocedural complications and disorders of digestive system: Secondary | ICD-10-CM

## 2021-07-09 DIAGNOSIS — N028 Recurrent and persistent hematuria with other morphologic changes: Secondary | ICD-10-CM | POA: Diagnosis not present

## 2021-07-09 HISTORY — PX: RECTAL BIOPSY: SHX2303

## 2021-07-09 HISTORY — PX: RECTAL EXAM UNDER ANESTHESIA: SHX6399

## 2021-07-09 LAB — GLUCOSE, CAPILLARY: Glucose-Capillary: 126 mg/dL — ABNORMAL HIGH (ref 70–99)

## 2021-07-09 SURGERY — EXAM UNDER ANESTHESIA, RECTUM
Anesthesia: Monitor Anesthesia Care

## 2021-07-09 MED ORDER — SODIUM CHLORIDE 0.9 % IV SOLN: 250.0000 mL | INTRAVENOUS | Status: DC | PRN

## 2021-07-09 MED ORDER — BUPIVACAINE-EPINEPHRINE (PF) 0.25% -1:200000 IJ SOLN
INTRAMUSCULAR | Status: AC
  Filled 2021-07-09: qty 30

## 2021-07-09 MED ORDER — ACETAMINOPHEN 650 MG RE SUPP
650.0000 mg | RECTAL | Status: DC | PRN
  Filled 2021-07-09: qty 1

## 2021-07-09 MED ORDER — 0.9 % SODIUM CHLORIDE (POUR BTL) OPTIME
TOPICAL | Status: DC | PRN
  Administered 2021-07-09: 1000 mL

## 2021-07-09 MED ORDER — SODIUM CHLORIDE 0.9% FLUSH: 3.0000 mL | Freq: Two times a day (BID) | INTRAVENOUS | Status: DC

## 2021-07-09 MED ORDER — ONDANSETRON HCL 4 MG/2ML IJ SOLN
INTRAMUSCULAR | Status: DC | PRN
Start: 2021-07-09 — End: 2021-07-09
  Administered 2021-07-09: 4 mg via INTRAVENOUS

## 2021-07-09 MED ORDER — LACTATED RINGERS IV SOLN: Freq: Once | INTRAVENOUS | Status: AC

## 2021-07-09 MED ORDER — FENTANYL CITRATE PF 50 MCG/ML IJ SOSY: 25.0000 ug | PREFILLED_SYRINGE | INTRAMUSCULAR | Status: DC | PRN

## 2021-07-09 MED ORDER — ORAL CARE MOUTH RINSE: 15.0000 mL | Freq: Once | OROMUCOSAL | Status: AC

## 2021-07-09 MED ORDER — PROPOFOL 10 MG/ML IV BOLUS
INTRAVENOUS | Status: AC
  Filled 2021-07-09: qty 20

## 2021-07-09 MED ORDER — BUPIVACAINE-EPINEPHRINE (PF) 0.5% -1:200000 IJ SOLN
INTRAMUSCULAR | Status: AC
  Filled 2021-07-09: qty 30

## 2021-07-09 MED ORDER — ACETAMINOPHEN 325 MG PO TABS: 650.0000 mg | ORAL_TABLET | ORAL | Status: DC | PRN

## 2021-07-09 MED ORDER — MIDAZOLAM HCL 5 MG/5ML IJ SOLN
INTRAMUSCULAR | Status: DC | PRN
  Administered 2021-07-09: 2 mg via INTRAVENOUS

## 2021-07-09 MED ORDER — LACTATED RINGERS IV SOLN: INTRAVENOUS | Status: DC

## 2021-07-09 MED ORDER — ACETAMINOPHEN 500 MG PO TABS
1000.0000 mg | ORAL_TABLET | Freq: Once | ORAL | Status: AC
  Administered 2021-07-09: 1000 mg via ORAL
  Filled 2021-07-09: qty 2

## 2021-07-09 MED ORDER — FENTANYL CITRATE (PF) 100 MCG/2ML IJ SOLN
INTRAMUSCULAR | Status: AC
  Filled 2021-07-09: qty 2

## 2021-07-09 MED ORDER — BUPIVACAINE-EPINEPHRINE 0.5% -1:200000 IJ SOLN
INTRAMUSCULAR | Status: DC | PRN
  Administered 2021-07-09: 25 mL

## 2021-07-09 MED ORDER — OXYCODONE HCL 5 MG/5ML PO SOLN: 5.0000 mg | Freq: Once | ORAL | Status: DC | PRN

## 2021-07-09 MED ORDER — FENTANYL CITRATE (PF) 100 MCG/2ML IJ SOLN
INTRAMUSCULAR | Status: DC | PRN
Start: 2021-07-09 — End: 2021-07-09
  Administered 2021-07-09: 25 ug via INTRAVENOUS

## 2021-07-09 MED ORDER — PROPOFOL 500 MG/50ML IV EMUL
INTRAVENOUS | Status: DC | PRN
  Administered 2021-07-09: 25 ug/kg/min via INTRAVENOUS

## 2021-07-09 MED ORDER — OXYCODONE HCL 5 MG PO TABS: 5.0000 mg | ORAL_TABLET | ORAL | Status: DC | PRN

## 2021-07-09 MED ORDER — OXYCODONE HCL 5 MG PO TABS: 5.0000 mg | ORAL_TABLET | Freq: Once | ORAL | Status: DC | PRN

## 2021-07-09 MED ORDER — SODIUM CHLORIDE 0.9% FLUSH: 3.0000 mL | INTRAVENOUS | Status: DC | PRN

## 2021-07-09 MED ORDER — CHLORHEXIDINE GLUCONATE 0.12 % MT SOLN
15.0000 mL | Freq: Once | OROMUCOSAL | Status: AC
  Administered 2021-07-09: 15 mL via OROMUCOSAL

## 2021-07-09 MED ORDER — ONDANSETRON HCL 4 MG/2ML IJ SOLN: 4.0000 mg | Freq: Once | INTRAMUSCULAR | Status: DC | PRN

## 2021-07-09 MED ORDER — MIDAZOLAM HCL 2 MG/2ML IJ SOLN
INTRAMUSCULAR | Status: AC
  Filled 2021-07-09: qty 2

## 2021-07-09 SURGICAL SUPPLY — 71 items
BAG COUNTER SPONGE SURGICOUNT (BAG) IMPLANT
BENZOIN TINCTURE PRP APPL 2/3 (GAUZE/BANDAGES/DRESSINGS) ×4 IMPLANT
BLADE EXTENDED COATED 6.5IN (ELECTRODE) IMPLANT
BLADE SURG 10 STRL SS (BLADE) IMPLANT
BLADE SURG 15 STRL LF DISP TIS (BLADE) IMPLANT
BLADE SURG 15 STRL SS (BLADE)
BLADE SURG SZ10 CARB STEEL (BLADE) ×2 IMPLANT
COVER BACK TABLE 60X90IN (DRAPES) ×2 IMPLANT
COVER MAYO STAND STRL (DRAPES) ×2 IMPLANT
COVER SURGICAL LIGHT HANDLE (MISCELLANEOUS) ×2 IMPLANT
DECANTER SPIKE VIAL GLASS SM (MISCELLANEOUS) ×2 IMPLANT
DRAPE LAPAROTOMY 100X72 PEDS (DRAPES) ×2 IMPLANT
DRAPE UTILITY XL STRL (DRAPES) ×2 IMPLANT
DRSG PAD ABDOMINAL 8X10 ST (GAUZE/BANDAGES/DRESSINGS) ×2 IMPLANT
DRSG VASELINE 3X18 (GAUZE/BANDAGES/DRESSINGS) IMPLANT
ELECT REM PT RETURN 15FT ADLT (MISCELLANEOUS) ×2 IMPLANT
ELECT REM PT RETURN 9FT ADLT (ELECTROSURGICAL) ×2 IMPLANT
ELECTRODE REM PT RTRN 9FT ADLT (ELECTROSURGICAL) ×1 IMPLANT
GAUZE 4X4 16PLY ~~LOC~~+RFID DBL (SPONGE) ×2 IMPLANT
GAUZE SPONGE 4X4 12PLY STRL (GAUZE/BANDAGES/DRESSINGS) ×2 IMPLANT
GLOVE SURG ENC MOIS LTX SZ6.5 (GLOVE) ×2 IMPLANT
GLOVE SURG UNDER LTX SZ6.5 (GLOVE) ×2 IMPLANT
GLOVE SURG UNDER POLY LF SZ7 (GLOVE) ×2 IMPLANT
GOWN STRL REUS W/TWL XL LVL3 (GOWN DISPOSABLE) ×4 IMPLANT
HYDROGEN PEROXIDE 16OZ (MISCELLANEOUS) ×2 IMPLANT
IV CATH 14GX2 1/4 (CATHETERS) ×2 IMPLANT
IV CATH 18G SAFETY (IV SOLUTION) ×2 IMPLANT
KIT BASIN OR (CUSTOM PROCEDURE TRAY) ×2 IMPLANT
KIT SIGMOIDOSCOPE (SET/KITS/TRAYS/PACK) IMPLANT
KIT TURNOVER CYSTO (KITS) ×2 IMPLANT
KIT TURNOVER KIT A (KITS) IMPLANT
LEGGING LITHOTOMY PAIR STRL (DRAPES) IMPLANT
LOOP VESSEL MAXI BLUE (MISCELLANEOUS) IMPLANT
NDL BIOPSY 14GX4.5 SOFT TIS (NEEDLE) IMPLANT
NDL HYPO 25X1 1.5 SAFETY (NEEDLE) ×1 IMPLANT
NDL SAFETY ECLIPSE 18X1.5 (NEEDLE) IMPLANT
NEEDLE BIOPSY 14GX4.5 SOFT TIS (NEEDLE) ×2 IMPLANT
NEEDLE HYPO 18GX1.5 SHARP (NEEDLE)
NEEDLE HYPO 22GX1.5 SAFETY (NEEDLE) ×2 IMPLANT
NEEDLE HYPO 25X1 1.5 SAFETY (NEEDLE) ×2 IMPLANT
NS IRRIG 500ML POUR BTL (IV SOLUTION) ×2 IMPLANT
PACK BASIN DAY SURGERY FS (CUSTOM PROCEDURE TRAY) ×2 IMPLANT
PACK LITHOTOMY IV (CUSTOM PROCEDURE TRAY) ×2 IMPLANT
PAD ARMBOARD 7.5X6 YLW CONV (MISCELLANEOUS) IMPLANT
PANTS MESH DISP LRG (UNDERPADS AND DIAPERS) ×2 IMPLANT
PANTS MESH DISPOSABLE L (UNDERPADS AND DIAPERS) ×2
PENCIL SMOKE EVACUATOR (MISCELLANEOUS) ×2 IMPLANT
SPIKE FLUID TRANSFER (MISCELLANEOUS) ×2 IMPLANT
SPONGE HEMORRHOID 8X3CM (HEMOSTASIS) IMPLANT
SPONGE SURGIFOAM ABS GEL 12-7 (HEMOSTASIS) IMPLANT
SUCTION FRAZIER HANDLE 10FR (MISCELLANEOUS)
SUCTION TUBE FRAZIER 10FR DISP (MISCELLANEOUS) IMPLANT
SUT CHROMIC 2 0 SH (SUTURE) IMPLANT
SUT CHROMIC 3 0 SH 27 (SUTURE) IMPLANT
SUT ETHIBOND 0 (SUTURE) IMPLANT
SUT GUT CHROMIC 3 0 (SUTURE) IMPLANT
SUT MON AB 3-0 SH 27 (SUTURE) ×2
SUT MON AB 3-0 SH27 (SUTURE) ×1 IMPLANT
SUT VIC AB 2-0 SH 27 (SUTURE)
SUT VIC AB 2-0 SH 27XBRD (SUTURE) IMPLANT
SUT VIC AB 3-0 SH 18 (SUTURE) IMPLANT
SUT VIC AB 3-0 SH 27 (SUTURE)
SUT VIC AB 3-0 SH 27XBRD (SUTURE) IMPLANT
SUT VIC AB 4-0 P-3 18XBRD (SUTURE) IMPLANT
SUT VIC AB 4-0 P3 18 (SUTURE)
SYR CONTROL 10ML LL (SYRINGE) ×2 IMPLANT
TOWEL OR 17X26 10 PK STRL BLUE (TOWEL DISPOSABLE) ×2 IMPLANT
TOWEL OR NON WOVEN STRL DISP B (DISPOSABLE) ×2 IMPLANT
TRAY DSU PREP LF (CUSTOM PROCEDURE TRAY) ×2 IMPLANT
TUBE CONNECTING 12X1/4 (SUCTIONS) ×2 IMPLANT
YANKAUER SUCT BULB TIP NO VENT (SUCTIONS) ×2 IMPLANT

## 2021-07-09 NOTE — Op Note (Signed)
07/09/2021  1:00 PM  PATIENT:  Jared White  76 y.o. male  Patient Care Team: [name], MD as PCP - General (Family Medicine) Delrae Rend, MD as Consulting Physician (Endocrinology) [place], [place] as Attending Physician (Neurosurgery)  PRE-OPERATIVE DIAGNOSIS:  RECTAL POLYP  POST-OPERATIVE DIAGNOSIS:  RECTAL POLYP  PROCEDURE:   ANAL RECTAL EXAM UNDER ANESTHESIA BIOPSY RECTAL    Surgeon(s): Leighton Ruff, MD  ASSISTANT: none   ANESTHESIA:   local and MAC  SPECIMEN:  Source of Specimen:   colorectal anastomotic stricture  DISPOSITION OF SPECIMEN:  PATHOLOGY  COUNTS:  YES  PLAN OF CARE: Discharge to home after PACU  PATIENT DISPOSITION:  PACU - hemodynamically stable.  INDICATION: 76 y.o. M with coloanal stricture and polypoid recurrence.     OR FINDINGS: no overt coloanal mass, coloanal stricture.  DESCRIPTION: the patient was identified in the preoperative holding area and taken to the OR where they were laid on the operating room table.  MAC anesthesia was induced without difficulty. The patient was then positioned in prone jackknife position with buttocks gently taped apart.  The patient was then prepped and draped in usual sterile fashion.  SCDs were noted to be in place prior to the initiation of anesthesia. A surgical timeout was performed indicating the correct patient, procedure, positioning and need for preoperative antibiotics.  A rectal block was performed using Marcaine with epinephrine.    I began with a digital rectal exam.  There was a 1 cm stricture of the coloanal anastomosis.  This could not be dilated due to scar.  I then placed a Hill-Ferguson anoscope into the anal canal and evaluated this completely.  There was no sign of polypoid tissue within the anus.  I attempted to perform rigid proctoscopy but the scope could not traverse the stricture.  I used a true cut needle to get deep biopsies of the stricture.  These  were sent to pathology.  The rectum was irrigated.  Hemostasis was good.  A gelfoam was placed in the anal canal for added hemostasis.  A dressing was applied.  Patient was awakened from anesthesia and sent to the PACU in stable condition.  All counts were correct.    Rosario Adie, MD  Col[facility]

## 2021-07-09 NOTE — Anesthesia Postprocedure Evaluation (Signed)
Anesthesia Post Note  Patient: Jared White  Procedure(s) Performed: ANAL RECTAL EXAM UNDER ANESTHESIA, RIGID PROCTOSCOPY BIOPSY RECTAL     Patient location during evaluation: PACU Anesthesia Type: MAC Level of consciousness: awake and alert Pain management: pain level controlled Vital Signs Assessment: post-procedure vital signs reviewed and stable Respiratory status: spontaneous breathing, nonlabored ventilation and respiratory function stable Cardiovascular status: blood pressure returned to baseline and stable Postop Assessment: no apparent nausea or vomiting Anesthetic complications: no   No notable events documented.  Last Vitals:  Vitals:   07/09/21 1315 07/09/21 1330  BP: (!) 146/69 (!) 163/77  Pulse: (!) 58 (!) 56  Resp: 15 15  Temp:    SpO2: 99% 100%    Last Pain:  Vitals:   07/09/21 1330  TempSrc:   PainSc: 0-No pain                 Pervis Hocking

## 2021-07-09 NOTE — H&P (Signed)
HPI: Jared White is an 76 y.o. male who is here for evaluation of anal mass under anesthesia.  He underwent a robotic low anterior resection and January 2022.  He received adjuvant chemotherapy and then underwent ileostomy reversal.  He then followed up with a colonoscopy in January of this year and was noted to have a possible anastomotic recurrence.  I was asked to evaluate him for possible excision.  Patient appears to have a firm mass in the posterior portion of the coloanal anastomosis.  I recommended exam under anesthesia and biopsy.  Past Medical History:  Diagnosis Date   Arthritis    Blood transfusion without reported diagnosis    Broken back    CAD (coronary artery disease)    PCI & CABG   Cataract    "LIGHT"-RIGHT   CHF (congestive heart failure) ([place])    Diverticulitis    mild - approx 2004   Dysrhythmia    occasional arrythmias   Family history of heart disease    GERD (gastroesophageal reflux disease)    Heart murmur    HX OF YEARS AGO    History of kidney stones    History of nuclear stress test 06/30/2011   lexiscan; normal pattern of perfusion post-stress; low risk scan    Hyperlipidemia    Hypertension    IgA nephropathy    Irregular heart beat    Myocardial infarction ([place])    Pre-diabetes    Rectal cancer ([place]) 08/19/2019   Systemic hypertension     Past Surgical History:  Procedure Laterality Date   CARDIAC CATHETERIZATION  08/22/1995   PTCA of OM (Dr. Marella Chimes)   COLONOSCOPY     CORONARY ARTERY BYPASS GRAFT  01/22/2000   x5 - LIMA to LAD, SVG to diagonal, sequential SVG to ramus & OM, SVG to PDA (Dr. Tharon Aquas Trigt)   DIVERTING ILEOSTOMY N/A 06/10/2020   Procedure: DIVERTING LOOP ILEOSTOMY;  Surgeon: Leighton Ruff, MD;  [facility];  Service: General;  Laterality: N/A;   ILEOSTOMY CLOSURE N/A 10/09/2020   Procedure: LOOP ILEOSTOMY REVERSAL;  Surgeon: Leighton Ruff, MD;  [facility];  Service: General;  Laterality: N/A;   IR  IMAGING GUIDED PORT INSERTION  09/04/2019   LUNG LOBECTOMY     left upper lobe   TRANSTHORACIC ECHOCARDIOGRAM  12/24/2012   EF 55-60%, mild LVH, mild conc hypertrophy; mild MR; LA mildly dilated   XI ROBOTIC ASSISTED LOWER ANTERIOR RESECTION N/A 06/10/2020   Procedure: XI ROBOTIC ASSISTED LOWER ANTERIOR RESECTION, MOBILIZATION OF SPLENIC FLEXURE, RIGID PROCTOSCOPY;  Surgeon: Leighton Ruff, MD;  [facility];  Service: General;  Laterality: N/A;    Family History  Problem Relation Age of Onset   Heart attack Father    Kidney failure Brother    Prostate cancer Brother    Heart attack Brother    Heart disease Brother    Hypertension Brother    Heart disease Brother    Hypertension Brother    Colon cancer Neg Hx    Esophageal cancer Neg Hx    Rectal cancer Neg Hx    Stomach cancer Neg Hx    Inflammatory bowel disease Neg Hx    Liver disease Neg Hx    Pancreatic cancer Neg Hx    Colon polyps Neg Hx     Social:  reports that he has never smoked. He has never used smokeless tobacco. He reports that he does not drink alcohol and does not use drugs.  Allergies: No Known Allergies  Medications: I have reviewed the patient's current medications.  Results for orders placed or performed during the hospital encounter of 07/09/21 (from the past 48 hour(s))  Glucose, capillary     Status: Abnormal   Collection Time: 07/09/21 10:40 AM  Result Value Ref Range   Glucose-Capillary 126 (H) 70 - 99 mg/dL    Comment: Glucose reference range applies only to samples taken after fasting for at least 8 hours.    No results found.  ROS - all of the below systems have been reviewed with the patient and positives are indicated with bold text General: chills, fever or night sweats Eyes: blurry vision or double vision ENT: epistaxis or sore throat Allergy/Immunology: itchy/watery eyes or nasal congestion Hematologic/Lymphatic: bleeding problems, blood clots or swollen lymph nodes Endocrine:  temperature intolerance or unexpected weight changes Breast: new or changing breast lumps or nipple discharge Resp: cough, shortness of breath, or wheezing CV: chest pain or dyspnea on exertion GI: as per HPI GU: dysuria, trouble voiding, or hematuria MSK: joint pain or joint stiffness Neuro: TIA or stroke symptoms Derm: pruritus and skin lesion changes Psych: anxiety and depression  PE Blood pressure (!) 153/79, pulse 70, temperature 98.4 F (36.9 C), temperature source Oral, resp. rate 16, SpO2 100 %. Constitutional: NAD; conversant; no deformities Eyes: Moist conjunctiva; no lid lag; anicteric; PERRL Neck: Trachea midline; no thyromegaly Lungs: Normal respiratory effort; no tactile fremitus CV: RRR; no palpable thrills; no pitting edema GI: Abd soft; no palpable hepatosplenomegaly MSK: Normal range of motion of extremities; no clubbing/cyanosis Psychiatric: Appropriate affect; alert and oriented x3 Lymphatic: No palpable cervical or axillary lymphadenopathy  Results for orders placed or performed during the hospital encounter of 07/09/21 (from the past 48 hour(s))  Glucose, capillary     Status: Abnormal   Collection Time: 07/09/21 10:40 AM  Result Value Ref Range   Glucose-Capillary 126 (H) 70 - 99 mg/dL    Comment: Glucose reference range applies only to samples taken after fasting for at least 8 hours.    No results found.    A/P: [name] is an 76 y.o. male with a mass at his coloanal anastomosis.  I have recommended exam under anesthesia and biopsy.  He has completed CT scans of the chest abdomen pelvis which showed no other signs of recurrence.  Risk of procedure today include bleeding and pain.  Rosario Adie, MD  Colorectal and [facility]

## 2021-07-09 NOTE — Discharge Instructions (Signed)
Beginning the day after surgery:  You may sit in a tub of warm water 2-3 times a day to relieve discomfort.  Eat a regular diet high in fiber.  Avoid foods that give you constipation or diarrhea.  Avoid foods that are difficult to digest, such as seeds, nuts, corn or popcorn.  Do not go any longer than 2 days without a bowel movement.  You may take a dose of Milk of Magnesia if you become constipated.    Drink 6-8 glasses of water daily.  Walking is encouraged.  Avoid strenuous activity and heavy lifting for one month after surgery.  You have some styrofoam-like soft packing in the rectum which will come out with the first bowel movement.   You will often notice bleeding with bowel movements.  This should slow down by the end of the first week of surgery  Expect some drainage.  This should slow down, too, by the end of the first week of surgery.  Wear an absorbent pad or soft cotton gauze in your underwear until the drainage stops.   Call the office if you have any questions or concerns.  Call immediately if you develop:  Excessive rectal bleeding (more than a cup or passing large clots) Increased discomfort Fever greater than 100 F Difficulty urinating

## 2021-07-09 NOTE — Transfer of Care (Signed)
Immediate Anesthesia Transfer of Care Note  Patient: Jared White  Procedure(s) Performed: ANAL RECTAL EXAM UNDER ANESTHESIA, RIGID PROCTOSCOPY BIOPSY RECTAL  Patient Location: PACU  Anesthesia Type:MAC  Level of Consciousness: awake, alert , oriented and patient cooperative  Airway & Oxygen Therapy: Patient Spontanous Breathing and Patient connected to face mask oxygen  Post-op Assessment: Report given to RN and Post -op Vital signs reviewed and stable  Post vital signs: Reviewed and stable  Last Vitals:  Vitals Value Taken Time  BP 147/71   Temp    Pulse 60 07/09/21 1310  Resp 21 07/09/21 1310  SpO2 100 % 07/09/21 1310  Vitals shown include unvalidated device data.  Last Pain:  Vitals:   07/09/21 1046  TempSrc: Oral         Complications: No notable events documented.

## 2021-07-10 ENCOUNTER — Encounter (HOSPITAL_COMMUNITY): Payer: Self-pay | Admitting: General Surgery

## 2021-07-12 LAB — SURGICAL PATHOLOGY

## 2021-07-14 ENCOUNTER — Other Ambulatory Visit: Payer: Self-pay | Admitting: General Surgery

## 2021-07-14 DIAGNOSIS — Z85048 Personal history of other malignant neoplasm of rectum, rectosigmoid junction, and anus: Secondary | ICD-10-CM

## 2021-08-02 ENCOUNTER — Ambulatory Visit
Admission: RE | Admit: 2021-08-02 | Discharge: 2021-08-02 | Disposition: A | Discharge status: Home / Self Care | Payer: Medicare Other | Source: Ambulatory Visit | Attending: General Surgery | Admitting: General Surgery

## 2021-08-02 DIAGNOSIS — Z85048 Personal history of other malignant neoplasm of rectum, rectosigmoid junction, and anus: Secondary | ICD-10-CM

## 2021-08-02 IMAGING — MR MR PELVIS W/O CM
7 of 8 series · 36 of 48 positions shown · non-contrast
Comparison: Comparison is made with chest abdomen and pelvis
evaluation from [DATE].

CLINICAL DATA: History of anorectal neoplasm post resection and
anastomosis. Found to have a recurrence in the area of the anal
canal by report based on recent colonoscopy.

EXAM:
MRI PELVIS WITHOUT CONTRAST
TECHNIQUE: Multiplanar multisequence MR imaging of the pelvis was performed. No
intravenous contrast was administered. Ultrasound gel was
administered per rectum to optimize tumor evaluation.

[Series 2: t2_tse_sag · sagittal · 3.0mm · 0.47mm/px · 4 of 37 slices shown]
[im 1/37]
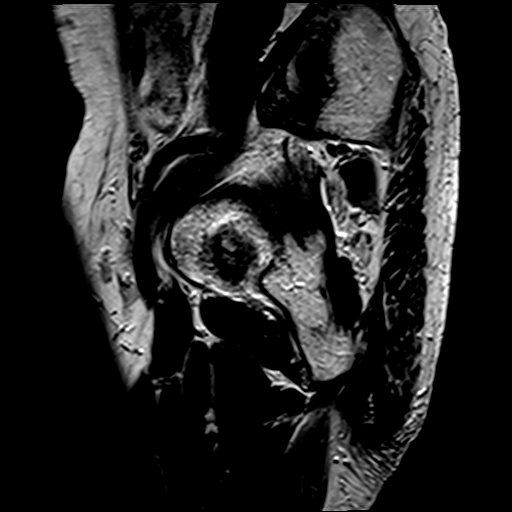
[im 13/37]
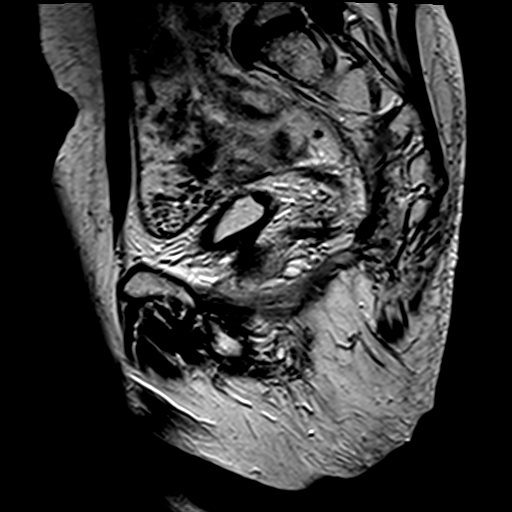
[im 25/37]
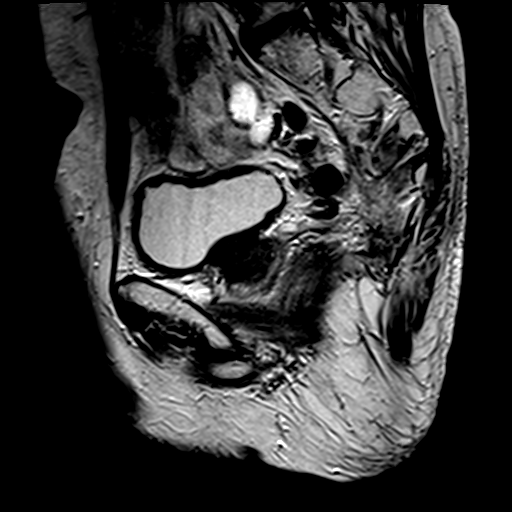
[im 37/37]
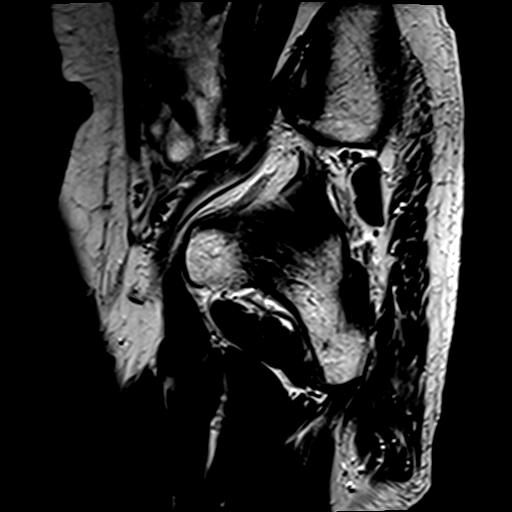

[Series 3: ep2d_diff_b0_100_500 · axial · 5.0mm · 1.88mm/px · z∈[-111,+116]mm · 8 of 117 slices shown]
[im 1/117]
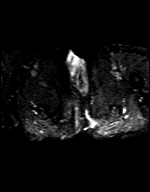
[im 18/117]
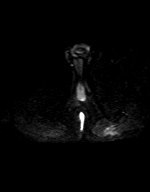
[im 36/117]
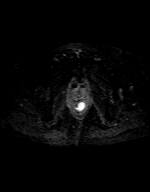
[im 54/117]
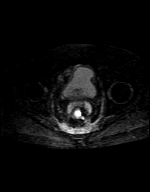
[im 63/117]
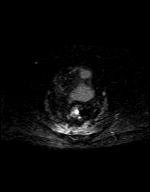
[im 81/117]
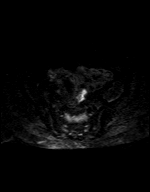
[im 99/117]
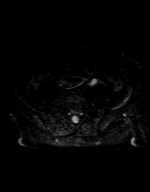
[im 117/117]
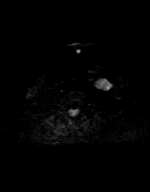

[Series 4: ep2d_diff_b0_100_500_adc · axial · 5.0mm · 1.88mm/px · z∈[-111,+116]mm · 5 of 39 slices shown]
[im 1/39]
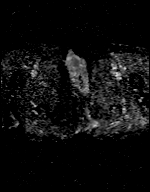
[im 10/39]
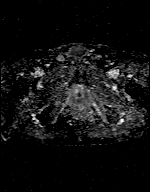
[im 20/39]
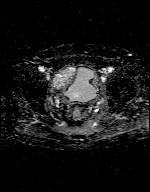
[im 29/39]
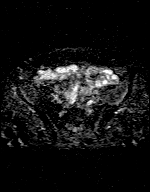
[im 39/39]
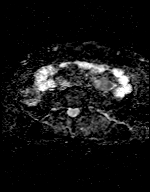

[Series 5: t2_tse_cor · coronal · 3.0mm · 0.39mm/px · 5 of 40 slices shown]
[im 1/40]
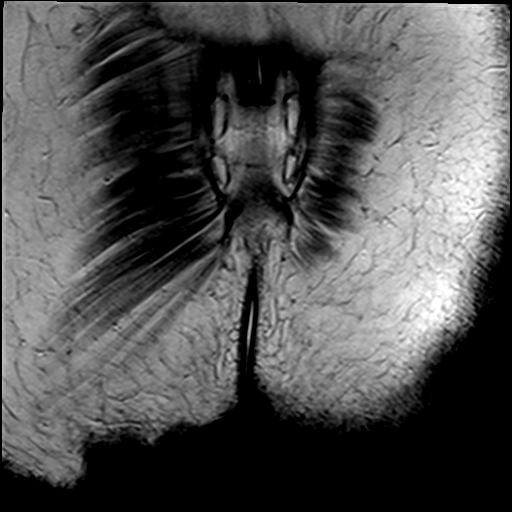
[im 10/40]
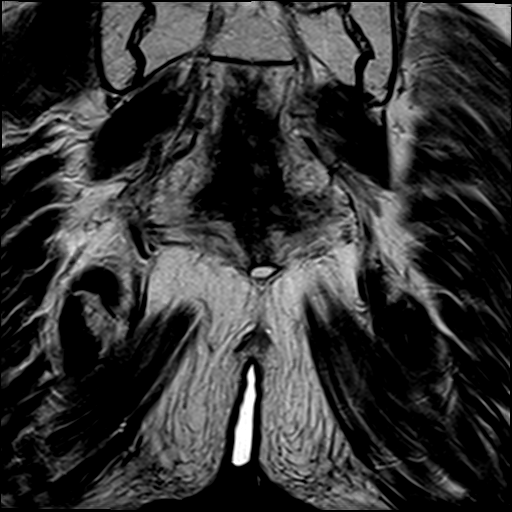
[im 20/40]
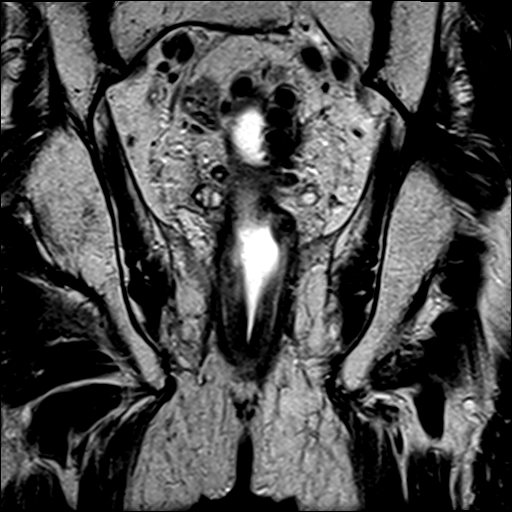
[im 30/40]
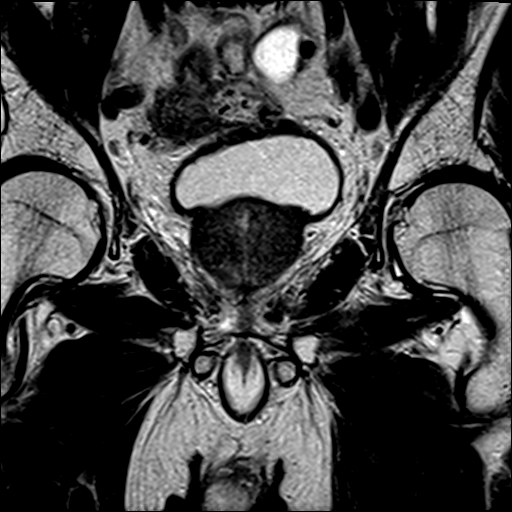
[im 40/40]
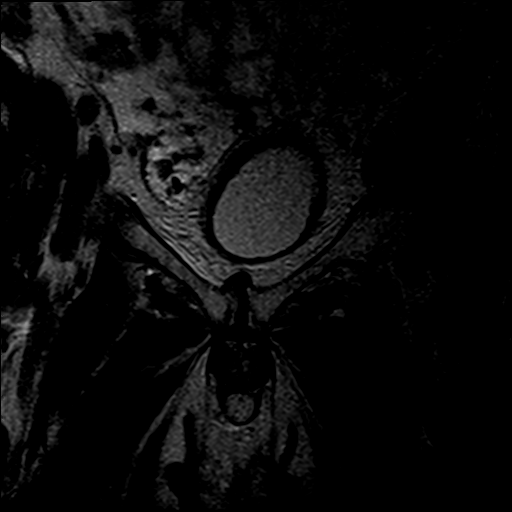

[Series 6: T2 fat-sat · axial · 5.0mm · 1.19mm/px · z∈[-129,+75]mm · 4 of 35 slices shown]
[im 1/35]
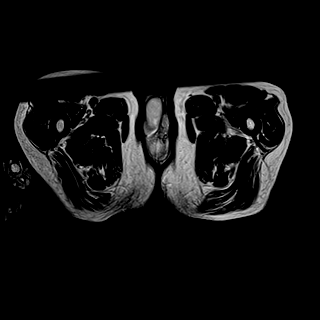
[im 12/35]
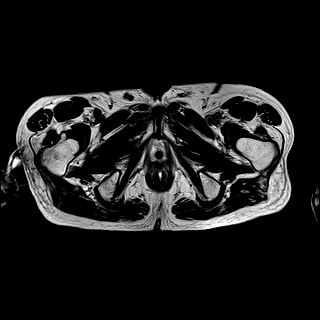
[im 23/35]
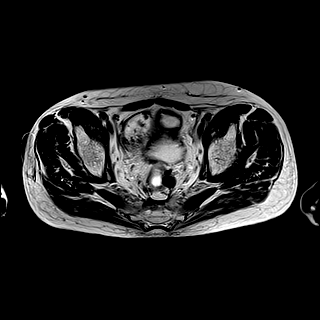
[im 35/35]
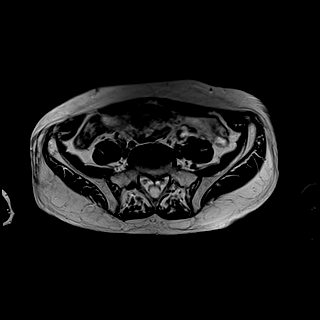

[Series 7: T2 · axial · 3.0mm · 0.78mm/px · z∈[-81,+75]mm · 7 of 55 slices shown (1 of 2)]
[im 1/55]
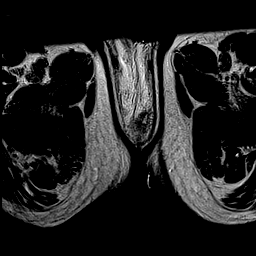
[im 10/55]
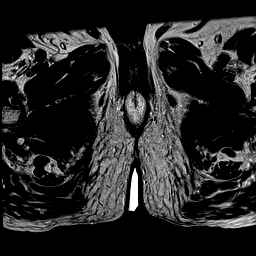
[im 19/55]
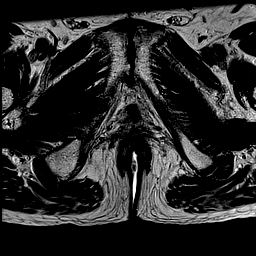
[im 28/55]
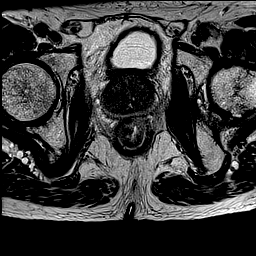
[im 37/55]
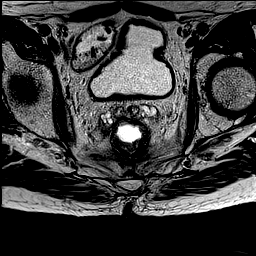
[im 46/55]
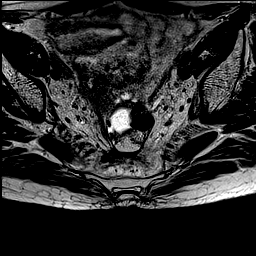
[im 55/55]
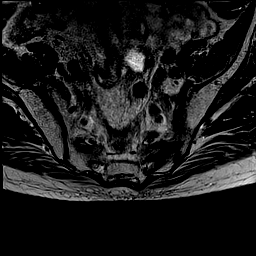

[Series 8: T2 · coronal · 3.0mm · 0.35mm/px · 3 of 45 slices shown (2 of 2)]
[im 1/45]
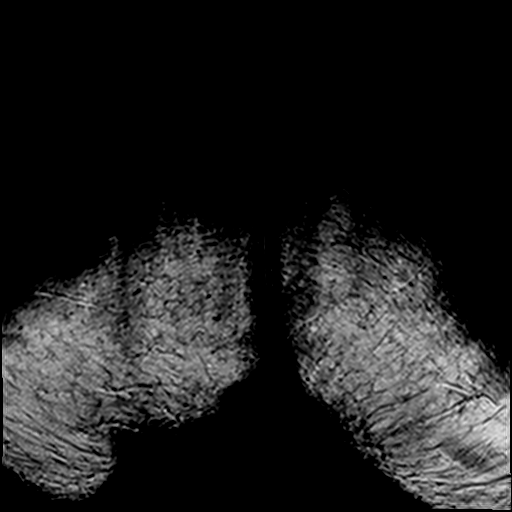
[im 12/45]
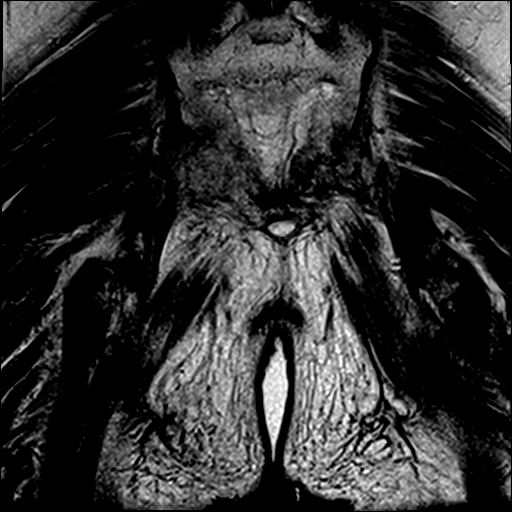
[im 23/45]
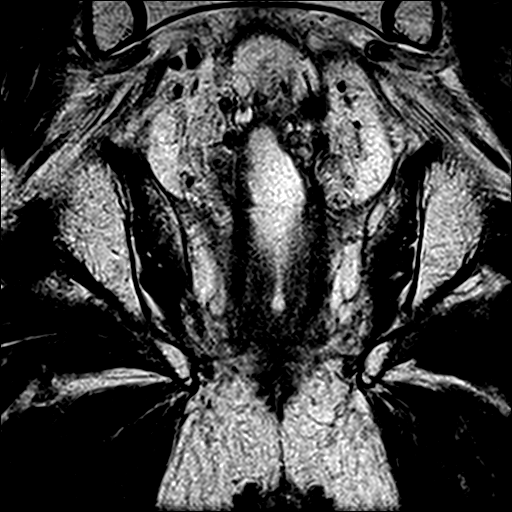

[36 of 48 positions shown; findings below may reference images not displayed]

FINDINGS: TUMOR LOCATION

Discrete tumor is not visible though at the upper margin of the
sphincter complex there is eccentric thickening of the mucosa of the
lower rectum that extends inferiorly likely into the
intersphincteric plane. There is also loss of normal fat planes just
above the pelvic floor (image [DATE]). And anastomotic site is noted
approximately 6 cm to 7 cm above the anal orifice. This is above the
area of concern at the pelvic floor. Signs of diverticular disease
are noted above the anastomotic site. There may be some mild
narrowing at the level of the anastomotic site as well.

TUMOR DESCRIPTION

Eccentric nodular appearing area as discussed above along the RIGHT
anterolateral margin when viewed from below of the lower rectum and
sphincter complex.

T - CATEGORY

T staging not well assessed.

N - CATEGORY

No discrete evidence of nodal disease in the pelvis.

Urinary Tract: Urinary bladder without signs of perivesical
stranding. No distal ureteral dilation.

Bowel: Low rectal to mid rectal anastomosis with irregularity of the
upper aspect of the anal canal potentially the site of endoscopic
abnormality. No discrete masslike area is visible. Remaining bowel
loops without acute process to the extent evaluated on pelvic MRI
focused on the rectum.

Vascular/Lymphatic: No adenopathy in the pelvis. Vascular structures
not well assessed due to lack of intravenous contrast.

Reproductive: Heterogeneous prostate, nonspecific finding.

Other: No ascites. Thickening of fascial planes in the pelvis
following previous resection and anastomosis.

Musculoskeletal: Fatty replacement of marrow about the pelvis. No
focal, suspicious bony abnormality on limited assessment.
IMPRESSION: Post treatment changes and anastomotic site with potential narrowing
at the level of the anastomosis and irregular more nodular appearing
areas that track towards the upper margin of the sphincter complex.
Study is confounded by presence of post treatment and postsurgical
change. No discrete mass lesion is identified.

No signs of adenopathy.

## 2021-08-03 ENCOUNTER — Ambulatory Visit: Payer: Self-pay | Admitting: General Surgery

## 2021-08-03 NOTE — H&P (Signed)
?PROVIDER:  Monico Blitz, MD ? ?MRN: [id number] ?DOB: [birth year] ?DATE OF ENCOUNTER: 08/03/2021 ?Interval History:  ? 76 year old male who presented to the office with a diagnosis of rectal cancer seen on colonoscopy in March.  He completed total neoadjuvant treatment with Dr. Alen Blew.  He finished radiation in early Nov 21.  Follow-up CT scans showed a slightly enlarged mass but no signs of metastatic disease prior to RT.  His CEA levels have been normal.  He underwent low anterior resection on June 11, 2019.  A diverting ileostomy was created.  He was then taken to the OR in May 2022 for ileostomy reversal after confirmation that his anastomosis was healed.  He has now recovered from this and is doing well.  F/u endoscopy shows an anastomotic polyp.  Bx show at least HGD.     ? ?Patient presents today after MRI ti discuss results and possible surgery.  He states that he gets loose stools and takes Imodium for this and then gets constipated and needs stool softeners, which put him back to loose stools.  He has had some recent rectal bleeding due to diarrhea.  He also reports some L shoulder pain ? ?Past Medical History:  ?Diagnosis Date  ? Arthritis   ? Blood transfusion without reported diagnosis   ? Broken back   ? CAD (coronary artery disease)   ? PCI & CABG  ? Cataract   ? "LIGHT"-RIGHT  ? CHF (congestive heart failure) ([place])   ? Diverticulitis   ? mild - approx 2004  ? Dysrhythmia   ? occasional arrythmias  ? Family history of heart disease   ? GERD (gastroesophageal reflux disease)   ? Heart murmur   ? HX OF YEARS AGO   ? History of kidney stones   ? History of nuclear stress test 06/30/2011  ? lexiscan; normal pattern of perfusion post-stress; low risk scan   ? Hyperlipidemia   ? Hypertension   ? IgA nephropathy   ? Irregular heart beat   ? Myocardial infarction [facility])   ? Pre-diabetes   ? Rectal cancer ([place]) 08/19/2019  ? Systemic hypertension   ? ?Past Surgical History:  ?Procedure Laterality  Date  ? CARDIAC CATHETERIZATION  08/22/1995  ? PTCA of OM (Dr. Marella Chimes)  ? COLONOSCOPY    ? CORONARY ARTERY BYPASS GRAFT  01/22/2000  ? x5 - LIMA to LAD, SVG to diagonal, sequential SVG to ramus & OM, SVG to PDA (Dr. Tharon Aquas Trigt)  ? DIVERTING ILEOSTOMY N/A 06/10/2020  ? Procedure: DIVERTING LOOP ILEOSTOMY;  Surgeon: Leighton Ruff, MD;  Location: [facility];  Service: General;  Laterality: N/A;  ? ILEOSTOMY CLOSURE N/A 10/09/2020  ? Procedure: LOOP ILEOSTOMY REVERSAL;  Surgeon: Leighton Ruff, MD;  Location: [facility];  Service: General;  Laterality: N/A;  ? IR IMAGING GUIDED PORT INSERTION  09/04/2019  ? LUNG LOBECTOMY    ? left upper lobe  ? RECTAL BIOPSY N/A 07/09/2021  ? Procedure: BIOPSY RECTAL;  Surgeon: Leighton Ruff, MD;  Location: [facility];  Service: General;  Laterality: N/A;  ? RECTAL EXAM UNDER ANESTHESIA N/A 07/09/2021  ? Procedure: ANAL RECTAL EXAM UNDER ANESTHESIA, RIGID PROCTOSCOPY;  Surgeon: Leighton Ruff, MD;  Location: [facility];  Service: General;  Laterality: N/A;  ? TRANSTHORACIC ECHOCARDIOGRAM  12/24/2012  ? EF 55-60%, mild LVH, mild conc hypertrophy; mild MR; LA mildly dilated  ? XI ROBOTIC ASSISTED LOWER ANTERIOR RESECTION N/A 06/10/2020  ? Procedure: XI ROBOTIC ASSISTED LOWER ANTERIOR RESECTION, MOBILIZATION OF  SPLENIC FLEXURE, RIGID PROCTOSCOPY;  Surgeon: Leighton Ruff, MD;  Location: [facility];  Service: General;  Laterality: N/A;  ? ?Family History  ?Problem Relation Age of Onset  ? Heart attack Father   ? Kidney failure Brother   ? Prostate cancer Brother   ? Heart attack Brother   ? Heart disease Brother   ? Hypertension Brother   ? Heart disease Brother   ? Hypertension Brother   ? Colon cancer Neg Hx   ? Esophageal cancer Neg Hx   ? Rectal cancer Neg Hx   ? Stomach cancer Neg Hx   ? Inflammatory bowel disease Neg Hx   ? Liver disease Neg Hx   ? Pancreatic cancer Neg Hx   ? Colon polyps Neg Hx   ? ?Social History  ? ?Socioeconomic History  ? Marital status: Single  ?  Spouse name: n/a  ?  Number of children: 0  ? Years of education: Not on file  ? Highest education level: Not on file  ?Occupational History  ? Occupation: Chief Strategy Officer, [employer] representative  ?Tobacco Use  ? Smoking status: Never  ? Smokeless tobacco: Never  ?Vaping Use  ? Vaping Use: Never used  ?Substance and Sexual Activity  ? Alcohol use: Never  ? Drug use: No  ? Sexual activity: Not on file  ?Other Topics Concern  ? Not on file  ?Social History Narrative  ? Lives alone.  ? Sister-in-Law lives next door.  ? ?Social Determinants of Health  ? ?Financial Resource Strain: Not on file  ?Food Insecurity: Not on file  ?Transportation Needs: Not on file  ?Physical Activity: Not on file  ?Stress: Not on file  ?Social Connections: Not on file  ?Intimate Partner Violence: Not on file  ? ? ?Current Outpatient Medications:  ?  ascorbic acid (VITAMIN C) 500 MG tablet, Take 500 mg by mouth daily., Disp: , Rfl:  ?  aspirin 325 MG EC tablet, Take 325 mg by mouth daily., Disp: , Rfl:  ?  atenolol (TENORMIN) 50 MG tablet, TAKE 1 TABLET BY MOUTH EVERY DAY, Disp: 90 tablet, Rfl: 3 ?  Calcium Carbonate (CALCIUM 500 PO), Take 500 mg by mouth in the morning and at bedtime., Disp: , Rfl:  ?  cholecalciferol (VITAMIN D3) 25 MCG (1000 UNIT) tablet, Take 1,000 Units by mouth daily., Disp: , Rfl:  ?  loperamide (IMODIUM) 2 MG capsule, Take 1 capsule (2 mg total) by mouth 4 (four) times daily as needed., Disp: 60 capsule, Rfl: 2 ?  Magnesium 250 MG TABS, Take 500 mg by mouth in the morning and at bedtime., Disp: , Rfl:  ?  nitroGLYCERIN (NITROSTAT) 0.4 MG SL tablet, Place 0.4 mg under the tongue every 5 (five) minutes x 3 doses as needed for chest pain., Disp: , Rfl:  ?  omeprazole (PRILOSEC) 40 MG capsule, TAKE 1 CAPSULE (40 MG TOTAL) BY MOUTH DAILY., Disp: 30 capsule, Rfl: 3 ?  rosuvastatin (CRESTOR) 20 MG tablet, TAKE 1 TABLET BY MOUTH EVERY DAY, Disp: 90 tablet, Rfl: 3 ?  valsartan (DIOVAN) 160 MG tablet, TAKE 1 TABLET BY MOUTH EVERY DAY, Disp: 90 tablet, Rfl:  3 ?  vitamin B-12 (CYANOCOBALAMIN) 50 MCG tablet, Take 50 mcg by mouth daily., Disp: , Rfl:  ?  vitamin E 45 MG (100 UNITS) capsule, Take 100 Units by mouth daily., Disp: , Rfl:   ? ?No Known Allergies ? ? ?Review of Systems - Negative except as stated in HPI   ? ?Physical Examination:  ? ?  Gen: NAD ?Abd: Soft ?CV RRR ?Lungs: CTA ?Incision: Healing appropriately ? ? ?Assessment and Plan:  ? ?Jared White is a 76 y.o. male who underwent low anterior resection and primary anastomosis with diverting ileostomy in January 2022 and ileostomy reversal 3 months later.  Postoperatively he has complained of irregular bowel habits.  Follow-up colonoscopy showed a possible anastomotic recurrence.  Biopsies showed high-grade dysplasia.   He was taken to the operating room for exam under anesthesia and deeper biopsies.  This did show an anastomotic recurrence.  CT scan showed no signs of metastatic disease.  MRI shows an anastomotic stricture but no definitive mass.  Fat planes appear to be intact around the prostate and bladder.  I have recommended proceeding with a robotic assisted abdominal perineal resection with permanent colostomy.  We discussed that this is his best chance at curative intent.  Risk with redo pelvic surgery or approximately double the risk of primary surgery.  There is also a significant chance of recurrence due to the small amount of margin that can be possible deep in the pelvis.  Patient is not excited about having a permanent colostomy but is willing to undergo the procedure if there is a possible cure.  We will get cardiac clearance.  We will have urology place firefly prior to the procedure to mark his ureters.  We will have the ostomy nurse mark him for a left lower quadrant ostomy. ? ?The surgery and anatomy were described to the patient as well as the risks of surgery and the possible complications.  These include: Bleeding, deep abdominal infections and possible wound complications such as hernia  and infection, damage to adjacent structures, leak of surgical connections, which can lead to other surgeries and possibly an ostomy, possible need for other procedures, such as abscess drains in radiology

## 2021-08-06 ENCOUNTER — Encounter: Payer: Self-pay | Admitting: Cardiology

## 2021-08-09 ENCOUNTER — Other Ambulatory Visit: Payer: Self-pay | Admitting: Urology

## 2021-08-09 ENCOUNTER — Other Ambulatory Visit: Payer: Self-pay

## 2021-08-09 ENCOUNTER — Ambulatory Visit (HOSPITAL_COMMUNITY)
Admission: RE | Admit: 2021-08-09 | Discharge: 2021-08-09 | Disposition: A | Discharge status: Home / Self Care | Payer: Medicare Other | Source: Ambulatory Visit | Attending: General Surgery | Admitting: General Surgery

## 2021-08-09 DIAGNOSIS — C20 Malignant neoplasm of rectum: Secondary | ICD-10-CM | POA: Insufficient documentation

## 2021-08-09 DIAGNOSIS — Z433 Encounter for attention to colostomy: Secondary | ICD-10-CM | POA: Insufficient documentation

## 2021-08-09 DIAGNOSIS — Z85048 Personal history of other malignant neoplasm of rectum, rectosigmoid junction, and anus: Secondary | ICD-10-CM | POA: Diagnosis present

## 2021-08-09 DIAGNOSIS — Z9889 Other specified postprocedural states: Secondary | ICD-10-CM | POA: Diagnosis not present

## 2021-08-09 NOTE — Discharge Instructions (Signed)
Call clinic for appointment after surgery ?

## 2021-08-09 NOTE — Progress Notes (Signed)
[facility]  ? ?Reason for visit:  ?Marking for colostomy- history rectal cancer - recurrence of benign mass.  Opting for robotic assisted abdominoperineal resection with permanent colostomy ?HPI:  ?Rectal cancer ?ROS  ?Review of Systems  ?Gastrointestinal:   ?     History ileostomy ?Recurrence rectal cancer  ?Skin:   ?     RUQ scar from ileostomy ?Marked above umbilicus so patient can visualize stoma for self care.  Ileostomy was above umbilicus as well.   ?Psychiatric/Behavioral:    ?     Anxious regarding surgery  emotional support provided  ?All other systems reviewed and are negative. ?Vital signs:  ?BP 129/60   Pulse 72   Temp 98.3 ?F (36.8 ?C)   Resp 18   SpO2 98%  ?Exam:  ?[facility] Nurse requested for preoperative stoma site marking ? ?Discussed surgical procedure and stoma creation with patient and family.  Explained role of the [facility] nurse team.  Provided the patient with educational booklet and provided samples of pouching options.  Answered patient and family questions. Here with brother in law from [place] ? ?Examined patient lying, sitting, and standing in order to place the marking in the patient's visual field, away from any creases or abdominal contour issues and within the rectus muscle.  Scarring present to RUQ from ileostomy ?Marked for colostomy in the LUQ 4 cm to the left of the umbilicus and 2 cm above the umbilicus. ? ?Initially, patient states he doesn't know why he is here, he would prefer not to have an ostomy. He also knows that the findings were not cancerous at that time.  I discuss Dr. Marcello Moores' findings and plans per her last visit note.  Reinforcing that the permanent colostomy and resection would be curative and performed while he is in his optimal health instead of an emergent procedure if the mass became obstructive or malignant.  His brother in law reminds him that is what his surgeon said also.  ?We discuss the differences in ileostomy vs. Colostomy including  consistency of stool, possibility of irrigation after 6 months and decreased risk of dehydration and blockage.  He is ready to proceed with surgery and states he feels a lot more clear and prepared.  ?I send him home with an additional transparent film and his marker to reinforce the mark.    ?Patient's abdomen cleansed with CHG wipes at site markings, allowed to air dry prior to marking.Covered mark with thin film transparent dressing to preserve mark until date of surgery.  ? ?[facility] Nurse team will follow up with patient after surgery for continue ostomy care and teaching.  ? ?Domenic Moras MSN, RN, FNP-BC CWON ?Wound, Ostomy, Continence Nurse ?Pager [phone]   ?Impression/dx  ?Presurgical marking ?Discussion  ?See above presurgical counseling and marking.  ?Plan  ?See back in clinic postoperatively ? ? ? ?Visit time: 45 minutes.  ? ?Domenic Moras FNP-BC ? ?  ?

## 2021-08-11 ENCOUNTER — Ambulatory Visit: Payer: Medicare Other | Admitting: Cardiology

## 2021-08-11 ENCOUNTER — Encounter: Payer: Self-pay | Admitting: Cardiology

## 2021-08-11 ENCOUNTER — Other Ambulatory Visit: Payer: Self-pay

## 2021-08-11 VITALS — BP 151/63 | HR 66 | Temp 97.5°F | Resp 17 | Ht 68.0 in | Wt 137.0 lb

## 2021-08-11 DIAGNOSIS — Z01818 Encounter for other preprocedural examination: Secondary | ICD-10-CM | POA: Insufficient documentation

## 2021-08-11 DIAGNOSIS — I2510 Atherosclerotic heart disease of native coronary artery without angina pectoris: Secondary | ICD-10-CM

## 2021-08-11 MED ORDER — ASPIRIN EC 81 MG PO TBEC: 81.0000 mg | DELAYED_RELEASE_TABLET | Freq: Every day | ORAL | 3 refills | Status: DC

## 2021-08-11 NOTE — Progress Notes (Signed)
? ? ?Subjective:  ? ?Jared White, male    DOB: 1945-05-24, 76 y.o.   MRN: [id number] ? ?Chief complaint:  ?Coronary artery disease ? ?  ?HPI ? ?76 year old male with coronary artery disease status post CABG 2001 by Dr. Prescott Gum (LIMA-LAD, SVG-Diag, seg SVG-ramus & OM, SVG-PDA), hypertension, CKD III w/IgA nephropathy, hyperlipidemia. ? ?Patient is going to undergo further colon resection for his cancer and is going to need colostomy. He remains stable from cardiac standpoint, denies chest pain, shortness of breath, palpitations, leg edema, orthopnea, PND, TIA/syncope. Blood pressure elevated in office, controlled at home.  ? ? ? ?Current Outpatient Medications:  ?  ascorbic acid (VITAMIN C) 500 MG tablet, Take 500 mg by mouth daily., Disp: , Rfl:  ?  aspirin 325 MG EC tablet, Take 325 mg by mouth daily., Disp: , Rfl:  ?  atenolol (TENORMIN) 50 MG tablet, TAKE 1 TABLET BY MOUTH EVERY DAY, Disp: 90 tablet, Rfl: 3 ?  Calcium Carbonate (CALCIUM 500 PO), Take 500 mg by mouth in the morning and at bedtime., Disp: , Rfl:  ?  cholecalciferol (VITAMIN D3) 25 MCG (1000 UNIT) tablet, Take 1,000 Units by mouth daily., Disp: , Rfl:  ?  loperamide (IMODIUM) 2 MG capsule, Take 1 capsule (2 mg total) by mouth 4 (four) times daily as needed., Disp: 60 capsule, Rfl: 2 ?  Magnesium 250 MG TABS, Take 500 mg by mouth in the morning and at bedtime., Disp: , Rfl:  ?  nitroGLYCERIN (NITROSTAT) 0.4 MG SL tablet, Place 0.4 mg under the tongue every 5 (five) minutes x 3 doses as needed for chest pain., Disp: , Rfl:  ?  omeprazole (PRILOSEC) 40 MG capsule, TAKE 1 CAPSULE (40 MG TOTAL) BY MOUTH DAILY., Disp: 30 capsule, Rfl: 3 ?  rosuvastatin (CRESTOR) 20 MG tablet, TAKE 1 TABLET BY MOUTH EVERY DAY, Disp: 90 tablet, Rfl: 3 ?  valsartan (DIOVAN) 160 MG tablet, TAKE 1 TABLET BY MOUTH EVERY DAY, Disp: 90 tablet, Rfl: 3 ?  vitamin B-12 (CYANOCOBALAMIN) 50 MCG tablet, Take 50 mcg by mouth daily., Disp: , Rfl:  ?  vitamin E 45 MG (100 UNITS)  capsule, Take 100 Units by mouth daily., Disp: , Rfl:  ? ? ? ?Cardiovascular studies: ? ?EKG 08/11/2021: ?Sinus rhythm 65 bpm ?Normal EKG ? ?Echocardiogram 03/23/2020:  ?Normal LV systolic function with visual EF 50-55%. Abnormal septal wall  ?motion due to post-operative coronary artery bypass graft. Left ventricle  ?cavity is normal in size. Normal global wall motion. Normal diastolic  ?filling pattern, normal LAP. Calculated EF 54%.  ?Structurally normal tricuspid valve. No evidence of tricuspid stenosis.  ?Mild tricuspid regurgitation. No evidence of pulmonary hypertension.  ?No other significant valvular abnormalities are seen.  ?Compared to prior study dated 2014: no significant change noted.  ? ?Lexiscan (Walking with mod Bruce)Tetrofosmin Stress Test  03/30/2020: ?Nondiagnostic ECG stress. Converted to walking Lexiscan after 3:00 Bruce attempt due to dyspnea.  ?Myocardial perfusion is normal with mild diaphragmatic attenuation in inferior wall. minimal ischemia in this region cannot be excluded.  ?Overall LV systolic function is normal without regional wall motion abnormalities. Stress LV EF: 57%.  ?No previous exam available for comparison. Low risk.  ? ?Recent labs: ?12/01/2020: ?Glucose 100, BUN/Cr 28/1.50. EGFR 49. Na/K 140/3.2. Rest of the CMP normal ?H/H 10/29. MCV 91. Platelets 129 ?HbA1C 5.9% ?No recent lipid panel ? ?03/19/2020: ?Glucose 115, BUN/Cr 20/1.44. EGFR 51. Na/K 140/3.8. Rest of the CMP normal ?H/H 12/36. MCV 99. Platelets  121 ? ?07/14/2017: ?H/H 13.7/41.  MCV 92.  Platelets 169 ?Glucose 119, BUN/creatinine 28/1.81.  EGFR 37.  Sodium 140, potassium 4.7 ? ?Review of Systems  ?Cardiovascular:  Negative for chest pain, dyspnea on exertion, leg swelling, palpitations and syncope.  ? ?   ?Vitals:  ? 08/11/21 1350  ?BP: (!) 151/63  ?Pulse: 66  ?Resp: 17  ?Temp: (!) 97.5 ?F (36.4 ?C)  ?SpO2: 98%  ? ? ?Physical Exam: ?Physical Exam ?Vitals and nursing note reviewed.  ?Constitutional:   ?   General:  He is not in acute distress. ?Neck:  ?   Vascular: No JVD.  ?Cardiovascular:  ?   Rate and Rhythm: Normal rate and regular rhythm.  ?   Heart sounds: Normal heart sounds. No murmur heard. ?Pulmonary:  ?   Effort: Pulmonary effort is normal.  ?   Breath sounds: Normal breath sounds. No wheezing or rales.  ? ? ? ?  ICD-10-CM   ?1. Pre-op exam  Z01.818 EKG 12-Lead  ?  ?2. Coronary artery disease involving native coronary artery of native heart without angina pectoris  I25.10 Lipid panel  ?  aspirin EC 81 MG tablet  ?  ? ?Orders Placed This Encounter  ?Procedures  ? Lipid panel  ? EKG 12-Lead  ? ? ? ?   ?Assessment & Recommendations: ? ?68 -year-old male with coronary artery disease status post CABG 2001 vy Dr. Lucianne Lei Trigt (LIMA-LAD, SVG-Diag, seg SVG-ramus & OM, SVG-PDA), hypertension, CKD III w/IgA nephropathy, hyperlipidemia, rectal cancer, now status post chemoradiation and surgery ? ?CAD: ?Stable. Recommend aspirin 81 mg, Crestor 20 mg. ?Check lipid panel in 6 months ? ?Good functional capacity in spite of known CAD.  ?Acceptable cardiac risk for upcoming colon surgery. ? ?Hypertension: ?Elevated today, but usually well controlled at home. ?No change made today. ? ?CKD 3: ?F/u w/nephrology. ? ?F/u in 6 months ? ?Nigel Mormon, MD ?[facility] ?Pager: [phone] ?Office: [phone] ?If no answer Cell [phone] ?   ? ? ?

## 2021-09-03 ENCOUNTER — Other Ambulatory Visit (HOSPITAL_COMMUNITY): Payer: Self-pay

## 2021-09-03 NOTE — Progress Notes (Addendum)
COVID Vaccine Completed: yes x3 ?Date COVID Vaccine completed: 06/24/19, 07/22/19 ?Has received booster: 01/17/20 ?COVID vaccine manufacturer: Pfizer     ? ?Date of COVID positive in last 90 days: no ? ?PCP - [name], MD ?Cardiologist - Vernell Leep, MD ? ?Cardiac clearance by [facility] 08/11/21 Epic ? ?Chest x-ray - CT 06/12/21 Epic ?EKG - 08/11/21 Epic ?Stress Test - 03/30/20 Epic ?ECHO - 03/26/20 Epic ?Cardiac Cath - 2001 ?Pacemaker/ICD device last checked: n/a ?Spinal Cord Stimulator: n/a ? ?Bowel Prep - ordered but patient has not received instructions from surgeons office. Instructed patient to call and ask. ? ?Sleep Study - yes, negative  ?CPAP -  ? ?Fasting Blood Sugar - pre DM, no checks at home or medications  ?Checks Blood Sugar _____ times a day ? ?Blood Thinner Instructions: ?Aspirin Instructions: ASA 81, hold 7 days ?Last Dose: ? ?Activity level: Can go up a flight of stairs and perform activities of daily living without stopping and without symptoms of chest pain or shortness of breath. ?     ? ?Anesthesia review: CHF, CABG x4, CKD, HTN, CAD, barrets esophagus, preDM ? ?Patient denies shortness of breath, fever, cough and chest pain at PAT appointment ? ? ?Patient verbalized understanding of instructions that were given to them at the PAT appointment. Patient was also instructed that they will need to review over the PAT instructions again at home before surgery.  ?

## 2021-09-03 NOTE — Patient Instructions (Addendum)
DUE TO COVID-19 ONLY ONE VISITOR  (aged 76 and older)  IS ALLOWED TO COME WITH YOU AND STAY IN THE WAITING ROOM ONLY DURING PRE OP AND PROCEDURE.   ?**NO VISITORS ARE ALLOWED IN THE SHORT STAY AREA OR RECOVERY ROOM!!** ? ?IF YOU WILL BE ADMITTED INTO THE HOSPITAL YOU ARE ALLOWED ONLY TWO SUPPORT PEOPLE DURING VISITATION HOURS ONLY (7 AM -8PM)   ?The support person(s) must pass our screening, gel in and out, and wear a mask at all times, including in the patient?s room. ?Patients must also wear a mask when staff or their support person are in the room. ?Visitors GUEST BADGE MUST BE WORN VISIBLY  ?One adult visitor may remain with you overnight and MUST be in the room by 8 P.M. ?  ? ? Your procedure is scheduled on: 09/17/21 ? ? Report to [facility] Main Entrance ? ?  Report to admitting at 9:30 AM ? ? Call this number if you have problems the morning of surgery [phone] ? ? Follow clear liquid diet the day before surgery. ? ? You may have the following liquids until 8:45 AM DAY OF SURGERY ? ?Water ?Black Coffee (sugar ok, NO MILK/CREAM OR CREAMERS)  ?Tea (sugar ok, NO MILK/CREAM OR CREAMERS) regular and decaf                             ?Plain Jell-O (NO RED)                                           ?Fruit ices (not with fruit pulp, NO RED)                                     ?Popsicles (NO RED)                                                                  ?Juice: apple, WHITE grape, WHITE cranberry ?Sports drinks like Gatorade (NO RED) ?Clear broth(vegetable,chicken,beef) ? ?             ?Drink 2 G2 drinks AT 10:00 PM the night before surgery.   ? ?  ?  ?The day of surgery:  ?Drink ONE (1) Pre-Surgery G2 at 8:45 AM the morning of surgery. Drink in one sitting. Do not sip.  ?This drink was given to you during your hospital  ?pre-op appointment visit. ?Nothing else to drink after completing the  ?Pre-Surgery G2. ?  ?       If you have questions, please contact your surgeon?s office. ? ? ?FOLLOW BOWEL  PREP AND ANY ADDITIONAL PRE OP INSTRUCTIONS YOU RECEIVED FROM YOUR SURGEON'S OFFICE!!! ?  ?  ?Oral Hygiene is also important to reduce your risk of infection.                                    ?Remember - BRUSH YOUR TEETH THE MORNING OF SURGERY WITH YOUR REGULAR TOOTHPASTE ? ?  Take these medicines the morning of surgery with A SIP OF WATER: Atenolol, Omeprazole, Rosuvastatin.  ?                  ?           You may not have any metal on your body including jewelry, and body piercing ? ?           Do not wear lotions, powders, cologne, or deodorant ? ?            Men may shave face and neck. ? ? Do not bring valuables to the hospital. [place] NOT ?            RESPONSIBLE   FOR VALUABLES. ? ? Bring small overnight bag day of surgery. ? ?            Please read over the following fact sheets you were given: IF [employer] [phone]- Apolonio Schneiders ? ?   [place] - Preparing for Surgery ?Before surgery, you can play an important role.  Because skin is not sterile, your skin needs to be as free of germs as possible.  You can reduce the number of germs on your skin by washing with CHG (chlorahexidine gluconate) soap before surgery.  CHG is an antiseptic cleaner which kills germs and bonds with the skin to continue killing germs even after washing. ?Please DO NOT use if you have an allergy to CHG or antibacterial soaps.  If your skin becomes reddened/irritated stop using the CHG and inform your nurse when you arrive at Short Stay. ?Do not shave (including legs and underarms) for at least 48 hours prior to the first CHG shower.  You may shave your face/neck. ? ?Please follow these instructions carefully: ? 1.  Shower with CHG Soap the night before surgery and the  morning of surgery. ? 2.  If you choose to wash your hair, wash your hair first as usual with your normal  shampoo. ? 3.  After you shampoo, rinse your hair and body thoroughly to remove the shampoo.                             ? 4.  Use CHG as you would any other liquid soap.  You can apply chg directly to the skin and wash.  Gently with a scrungie or clean washcloth. ? 5.  Apply the CHG Soap to your body ONLY FROM THE NECK DOWN.   Do   not use on face/ open      ?                     Wound or open sores. Avoid contact with eyes, ears mouth and   genitals (private parts).  ?                     Production manager,  Genitals (private parts) with your normal soap. ?            6.  Wash thoroughly, paying special attention to the area where your    surgery  will be performed. ? 7.  Thoroughly rinse your body with warm water from the neck down. ? 8.  DO NOT shower/wash with your normal soap after using and rinsing off the CHG Soap. ?  9.  Pat yourself dry with a clean towel. ?           10.  Wear clean pajamas. ?           11.  Place clean sheets on your bed the night of your first shower and do not  sleep with pets. ?Day of Surgery : ?Do not apply any lotions/deodorants the morning of surgery.  Please wear clean clothes to the hospital/surgery center. ? ?FAILURE TO FOLLOW THESE INSTRUCTIONS MAY RESULT IN THE CANCELLATION OF YOUR SURGERY ? ?PATIENT SIGNATURE_________________________________ ? ?NURSE SIGNATURE__________________________________ ? ?________________________________________________________________________  ? ?Incentive Spirometer ? ?An incentive spirometer is a tool that can help keep your lungs clear and active. This tool measures how well you are filling your lungs with each breath. Taking long deep breaths may help reverse or decrease the chance of developing breathing (pulmonary) problems (especially infection) following: ?A long period of time when you are unable to move or be active. ?BEFORE THE PROCEDURE  ?If the spirometer includes an indicator to show your best effort, your nurse or respiratory therapist will set it to a desired goal. ?If possible, sit up straight or lean slightly forward. Try not to  slouch. ?Hold the incentive spirometer in an upright position. ?INSTRUCTIONS FOR USE  ?Sit on the edge of your bed if possible, or sit up as far as you can in bed or on a chair. ?Hold the incentive spirometer in an upright position. ?Breathe out normally. ?Place the mouthpiece in your mouth and seal your lips tightly around it. ?Breathe in slowly and as deeply as possible, raising the piston or the ball toward the top of the column. ?Hold your breath for 3-5 seconds or for as long as possible. Allow the piston or ball to fall to the bottom of the column. ?Remove the mouthpiece from your mouth and breathe out normally. ?Rest for a few seconds and repeat Steps 1 through 7 at least 10 times every 1-2 hours when you are awake. Take your time and take a few normal breaths between deep breaths. ?The spirometer may include an indicator to show your best effort. Use the indicator as a goal to work toward during each repetition. ?After each set of 10 deep breaths, practice coughing to be sure your lungs are clear. If you have an incision (the cut made at the time of surgery), support your incision when coughing by placing a pillow or rolled up towels firmly against it. ?Once you are able to get out of bed, walk around indoors and cough well. You may stop using the incentive spirometer when instructed by your caregiver.  ?RISKS AND COMPLICATIONS ?Take your time so you do not get dizzy or light-headed. ?If you are in pain, you may need to take or ask for pain medication before doing incentive spirometry. It is harder to take a deep breath if you are having pain. ?AFTER USE ?Rest and breathe slowly and easily. ?It can be helpful to keep track of a log of your progress. Your caregiver can provide you with a simple table to help with this. ?If you are using the spirometer at home, follow these instructions: ?SEEK MEDICAL CARE IF:  ?You are having difficultly using the spirometer. ?You have trouble using the spirometer as often as  instructed. ?Your pain medication is not giving enough relief while using the spirometer. ?You develop fever of 100.5? F (38.1? C) or higher. ?SEEK IMMEDIATE MEDICAL CARE IF:  ?You cough up bloody

## 2021-09-06 ENCOUNTER — Encounter (HOSPITAL_COMMUNITY)
Admission: RE | Admit: 2021-09-06 | Discharge: 2021-09-06 | Disposition: A | Discharge status: Home / Self Care | Payer: Medicare Other | Source: Ambulatory Visit | Attending: General Surgery | Admitting: General Surgery

## 2021-09-06 ENCOUNTER — Encounter (HOSPITAL_COMMUNITY): Payer: Self-pay

## 2021-09-06 VITALS — BP 165/77 | HR 63 | Temp 98.8°F | Resp 14 | Ht 68.0 in | Wt 133.4 lb

## 2021-09-06 DIAGNOSIS — N183 Chronic kidney disease, stage 3 unspecified: Secondary | ICD-10-CM | POA: Insufficient documentation

## 2021-09-06 DIAGNOSIS — I509 Heart failure, unspecified: Secondary | ICD-10-CM | POA: Insufficient documentation

## 2021-09-06 DIAGNOSIS — I130 Hypertensive heart and chronic kidney disease with heart failure and stage 1 through stage 4 chronic kidney disease, or unspecified chronic kidney disease: Secondary | ICD-10-CM | POA: Insufficient documentation

## 2021-09-06 DIAGNOSIS — Z01818 Encounter for other preprocedural examination: Secondary | ICD-10-CM

## 2021-09-06 DIAGNOSIS — I2510 Atherosclerotic heart disease of native coronary artery without angina pectoris: Secondary | ICD-10-CM | POA: Diagnosis not present

## 2021-09-06 DIAGNOSIS — Z85048 Personal history of other malignant neoplasm of rectum, rectosigmoid junction, and anus: Secondary | ICD-10-CM | POA: Diagnosis not present

## 2021-09-06 DIAGNOSIS — Z01812 Encounter for preprocedural laboratory examination: Secondary | ICD-10-CM | POA: Insufficient documentation

## 2021-09-06 DIAGNOSIS — K219 Gastro-esophageal reflux disease without esophagitis: Secondary | ICD-10-CM | POA: Diagnosis not present

## 2021-09-06 DIAGNOSIS — N289 Disorder of kidney and ureter, unspecified: Secondary | ICD-10-CM | POA: Insufficient documentation

## 2021-09-06 LAB — CBC
HCT: 34.3 % — ABNORMAL LOW (ref 39.0–52.0)
Hemoglobin: 11.3 g/dL — ABNORMAL LOW (ref 13.0–17.0)
MCH: 31.7 pg (ref 26.0–34.0)
MCHC: 32.9 g/dL (ref 30.0–36.0)
MCV: 96.3 fL (ref 80.0–100.0)
Platelets: 133 10*3/uL — ABNORMAL LOW (ref 150–400)
RBC: 3.56 MIL/uL — ABNORMAL LOW (ref 4.22–5.81)
RDW: 13.8 % (ref 11.5–15.5)
WBC: 4.8 10*3/uL (ref 4.0–10.5)
nRBC: 0 % (ref 0.0–0.2)

## 2021-09-06 LAB — BASIC METABOLIC PANEL
Anion gap: 5 (ref 5–15)
BUN: 25 mg/dL — ABNORMAL HIGH (ref 8–23)
CO2: 27 mmol/L (ref 22–32)
Calcium: 8.6 mg/dL — ABNORMAL LOW (ref 8.9–10.3)
Chloride: 109 mmol/L (ref 98–111)
Creatinine, Ser: 1.17 mg/dL (ref 0.61–1.24)
GFR, Estimated: >60 mL/min (ref >60)
Glucose, Bld: 103 mg/dL — ABNORMAL HIGH (ref 70–99)
Potassium: 4 mmol/L (ref 3.5–5.1)
Sodium: 141 mmol/L (ref 135–145)

## 2021-09-06 LAB — GLUCOSE, CAPILLARY: Glucose-Capillary: 131 mg/dL — ABNORMAL HIGH (ref 70–99)

## 2021-09-07 ENCOUNTER — Inpatient Hospital Stay (HOSPITAL_COMMUNITY): Payer: Medicare Other | Admitting: Physician Assistant

## 2021-09-07 ENCOUNTER — Inpatient Hospital Stay (HOSPITAL_COMMUNITY): Payer: Medicare Other | Admitting: Anesthesiology

## 2021-09-07 LAB — TYPE AND SCREEN
ABO/RH(D): A POS
Antibody Screen: NEGATIVE

## 2021-09-07 NOTE — Progress Notes (Signed)
Anesthesia Chart Review ? ? Case: [id number] Date/Time: 09/17/21 1130  ? Procedures:  ?    ROBOTIC ASSISTED ABDOMINOPERINEAL RESECTION ?    CYSTOSCOPY with FIREFLY INJECTION  ? Anesthesia type: General  ? Pre-op diagnosis: recurrent rectal cancer  ? [facility] / [facility]  ? Surgeons: Leighton Ruff, MD; Lucas Mallow, MD  ? ?  ? ? ?DISCUSSION:76 y.o. never smoker with h/o HTN, GERD, CAD, CHF, CKD III w/IgA nephropathy, recurrent rectal cancer scheduled for above procedure 5/46/2703 with Dr. Leighton Ruff and Dr. Link Snuffer.  ? ?Pt last seen by cardiology 08/11/21. Per OV note, "Good functional capacity in spite of known CAD.  ?Acceptable cardiac risk for upcoming colon surgery." ? ?Anticipate pt can proceed with planned procedure barring acute status change.   ?VS: BP (!) 165/77   Pulse 63   Temp 37.1 ?C (Oral)   Resp 14   Ht '5\' 8"'$  (1.727 m)   Wt 60.5 kg   SpO2 100%   BMI 20.28 kg/m?  ? ?PROVIDERS: ?[name], MD is PCP  ? ?Vernell Leep, MD is Cardiologist  ?LABS: Labs reviewed: Acceptable for surgery. ?(all labs ordered are listed, but only abnormal results are displayed) ? ?Labs Reviewed  ?BASIC METABOLIC PANEL - Abnormal; Notable for the following components:  ?    Result Value  ? Glucose, Bld 103 (*)   ? BUN 25 (*)   ? Calcium 8.6 (*)   ? All other components within normal limits  ?CBC - Abnormal; Notable for the following components:  ? RBC 3.56 (*)   ? Hemoglobin 11.3 (*)   ? HCT 34.3 (*)   ? Platelets 133 (*)   ? All other components within normal limits  ?GLUCOSE, CAPILLARY - Abnormal; Notable for the following components:  ? Glucose-Capillary 131 (*)   ? All other components within normal limits  ?TYPE AND SCREEN  ? ? ? ?IMAGES: ? ? ?EKG: ?EKG 08/11/2021: ?Sinus rhythm 65 bpm ?Normal EKG ? ?CV: ?Lexiscan (Walking with mod Bruce)Tetrofosmin Stress Test  03/30/2020: ?Nondiagnostic ECG stress. Converted to walking Lexiscan after 3:00 Bruce attempt due to dyspnea.  ?Myocardial  perfusion is normal with mild diaphragmatic attenuation in inferior wall. minimal ischemia in this region cannot be excluded.  ?Overall LV systolic function is normal without regional wall motion abnormalities. Stress LV EF: 57%.  ?No previous exam available for comparison. Low risk.  ? ?Echocardiogram 03/23/2020:  ?Normal LV systolic function with visual EF 50-55%. Abnormal septal wall  ?motion due to post-operative coronary artery bypass graft. Left ventricle  ?cavity is normal in size. Normal global wall motion. Normal diastolic  ?filling pattern, normal LAP. Calculated EF 54%.  ?Structurally normal tricuspid valve. No evidence of tricuspid stenosis.  ?Mild tricuspid regurgitation. No evidence of pulmonary hypertension.  ?No other significant valvular abnormalities are seen.  ?Compared to prior study dated 2014: no significant change noted. ?Past Medical History:  ?Diagnosis Date  ? Arthritis   ? Blood transfusion without reported diagnosis   ? Broken back   ? CAD (coronary artery disease)   ? PCI & CABG  ? Cataract   ? "LIGHT"-RIGHT  ? CHF (congestive heart failure) ([place])   ? Diverticulitis   ? mild - approx 2004  ? Dysrhythmia   ? occasional arrythmias  ? Family history of heart disease   ? GERD (gastroesophageal reflux disease)   ? Heart murmur   ? HX OF YEARS AGO   ? History of  kidney stones   ? History of nuclear stress test 06/30/2011  ? lexiscan; normal pattern of perfusion post-stress; low risk scan   ? Hyperlipidemia   ? Hypertension   ? IgA nephropathy   ? Irregular heart beat   ? Myocardial infarction [facility])   ? Pre-diabetes   ? Rectal cancer ([place]) 08/19/2019  ? Systemic hypertension   ? ? ?Past Surgical History:  ?Procedure Laterality Date  ? CARDIAC CATHETERIZATION  08/22/1995  ? PTCA of OM (Dr. Marella Chimes)  ? COLONOSCOPY    ? CORONARY ARTERY BYPASS GRAFT  01/22/2000  ? x5 - LIMA to LAD, SVG to diagonal, sequential SVG to ramus & OM, SVG to PDA (Dr. Tharon Aquas Trigt)  ? DIVERTING ILEOSTOMY N/A  06/10/2020  ? Procedure: DIVERTING LOOP ILEOSTOMY;  Surgeon: Leighton Ruff, MD;  Location[facility];  Service: General;  Laterality: N/A;  ? ILEOSTOMY CLOSURE N/A 10/09/2020  ? Procedure: LOOP ILEOSTOMY REVERSAL;  Surgeon: Leighton Ruff, MD;  [facility];  Service: General;  Laterality: N/A;  ? IR IMAGING GUIDED PORT INSERTION  09/04/2019  ? LUNG LOBECTOMY    ? left upper lobe  ? RECTAL BIOPSY N/A 07/09/2021  ? Procedure: BIOPSY RECTAL;  Surgeon: Leighton Ruff, MD;  [facility];  Service: General;  Laterality: N/A;  ? RECTAL EXAM UNDER ANESTHESIA N/A 07/09/2021  ? Procedure: ANAL RECTAL EXAM UNDER ANESTHESIA, RIGID PROCTOSCOPY;  Surgeon: Leighton Ruff, MD;  Location: [facility];  Service: General;  Laterality: N/A;  ? TRANSTHORACIC ECHOCARDIOGRAM  12/24/2012  ? EF 55-60%, mild LVH, mild conc hypertrophy; mild MR; LA mildly dilated  ? XI ROBOTIC ASSISTED LOWER ANTERIOR RESECTION N/A 06/10/2020  ? Procedure: XI ROBOTIC ASSISTED LOWER ANTERIOR RESECTION, MOBILIZATION OF SPLENIC FLEXURE, RIGID PROCTOSCOPY;  Surgeon: Leighton Ruff, MD;  [facility];  Service: General;  Laterality: N/A;  ? ? ?MEDICATIONS: ? ascorbic acid (VITAMIN C) 500 MG tablet  ? aspirin EC 81 MG tablet  ? atenolol (TENORMIN) 50 MG tablet  ? b complex vitamins capsule  ? Calcium Carbonate (CALCIUM 500 PO)  ? cholecalciferol (VITAMIN D3) 25 MCG (1000 UNIT) tablet  ? loperamide (IMODIUM) 2 MG capsule  ? Magnesium 250 MG TABS  ? nitroGLYCERIN (NITROSTAT) 0.4 MG SL tablet  ? omeprazole (PRILOSEC) 40 MG capsule  ? rosuvastatin (CRESTOR) 20 MG tablet  ? valsartan (DIOVAN) 160 MG tablet  ? VITAMIN A PO  ? vitamin E 45 MG (100 UNITS) capsule  ? ?No current facility-administered medications for this encounter.  ? ? ?Konrad Felix Osment, PA-C ?[facility] ?[phone] ? ? ? ? ? ? ?

## 2021-09-17 ENCOUNTER — Encounter (HOSPITAL_COMMUNITY): Payer: Self-pay | Admitting: General Surgery

## 2021-09-17 ENCOUNTER — Encounter (HOSPITAL_COMMUNITY)
Admission: RE | Disposition: A | Discharge status: Home / Self Care | Payer: Self-pay | Source: Home / Self Care | Attending: General Surgery

## 2021-09-17 ENCOUNTER — Ambulatory Visit: Payer: Self-pay | Admitting: General Surgery

## 2021-09-17 ENCOUNTER — Other Ambulatory Visit: Payer: Self-pay

## 2021-09-17 ENCOUNTER — Ambulatory Visit (HOSPITAL_COMMUNITY)
Admission: RE | Admit: 2021-09-17 | Discharge: 2021-09-17 | Disposition: A | Discharge status: Home / Self Care | Payer: Medicare Other | Attending: General Surgery | Admitting: General Surgery

## 2021-09-17 DIAGNOSIS — I2510 Atherosclerotic heart disease of native coronary artery without angina pectoris: Secondary | ICD-10-CM | POA: Diagnosis not present

## 2021-09-17 DIAGNOSIS — C20 Malignant neoplasm of rectum: Secondary | ICD-10-CM | POA: Diagnosis present

## 2021-09-17 DIAGNOSIS — D696 Thrombocytopenia, unspecified: Secondary | ICD-10-CM | POA: Diagnosis not present

## 2021-09-17 DIAGNOSIS — I509 Heart failure, unspecified: Secondary | ICD-10-CM | POA: Insufficient documentation

## 2021-09-17 DIAGNOSIS — I252 Old myocardial infarction: Secondary | ICD-10-CM | POA: Insufficient documentation

## 2021-09-17 DIAGNOSIS — N028 Recurrent and persistent hematuria with other morphologic changes: Secondary | ICD-10-CM | POA: Insufficient documentation

## 2021-09-17 DIAGNOSIS — Z951 Presence of aortocoronary bypass graft: Secondary | ICD-10-CM | POA: Insufficient documentation

## 2021-09-17 DIAGNOSIS — I110 Hypertensive heart disease with heart failure: Secondary | ICD-10-CM | POA: Insufficient documentation

## 2021-09-17 DIAGNOSIS — Z538 Procedure and treatment not carried out for other reasons: Secondary | ICD-10-CM | POA: Insufficient documentation

## 2021-09-17 LAB — ABO/RH: ABO/RH(D): A POS

## 2021-09-17 SURGERY — RESECTION, RECTUM, LOW ANTERIOR, ROBOT-ASSISTED
Anesthesia: General

## 2021-09-17 MED ORDER — HEPARIN SODIUM (PORCINE) 5000 UNIT/ML IJ SOLN
5000.0000 [IU] | Freq: Once | INTRAMUSCULAR | Status: DC
  Filled 2021-09-17: qty 1

## 2021-09-17 MED ORDER — BISACODYL 5 MG PO TBEC: 20.0000 mg | DELAYED_RELEASE_TABLET | Freq: Once | ORAL | Status: DC

## 2021-09-17 MED ORDER — LACTATED RINGERS IV SOLN: INTRAVENOUS | Status: DC

## 2021-09-17 MED ORDER — CHLORHEXIDINE GLUCONATE 0.12 % MT SOLN
15.0000 mL | Freq: Once | OROMUCOSAL | Status: DC
Start: 2021-09-17 — End: 2021-09-17

## 2021-09-17 MED ORDER — ACETAMINOPHEN 500 MG PO TABS
1000.0000 mg | ORAL_TABLET | ORAL | Status: DC
  Filled 2021-09-17: qty 2

## 2021-09-17 MED ORDER — ALVIMOPAN 12 MG PO CAPS
12.0000 mg | ORAL_CAPSULE | ORAL | Status: DC
  Filled 2021-09-17: qty 1

## 2021-09-17 MED ORDER — POLYETHYLENE GLYCOL 3350 17 GM/SCOOP PO POWD
1.0000 | Freq: Once | ORAL | Status: DC
  Filled 2021-09-17: qty 255

## 2021-09-17 MED ORDER — BUPIVACAINE LIPOSOME 1.3 % IJ SUSP: 20.0000 mL | Freq: Once | INTRAMUSCULAR | Status: DC

## 2021-09-17 MED ORDER — SODIUM CHLORIDE 0.9 % IV SOLN
2.0000 g | INTRAVENOUS | Status: DC
  Filled 2021-09-17: qty 2

## 2021-09-17 MED ORDER — ENSURE PRE-SURGERY PO LIQD
592.0000 mL | Freq: Once | ORAL | Status: DC
  Filled 2021-09-17: qty 592

## 2021-09-17 MED ORDER — ENSURE PRE-SURGERY PO LIQD
296.0000 mL | Freq: Once | ORAL | Status: DC
  Filled 2021-09-17: qty 296

## 2021-09-17 MED ORDER — GABAPENTIN 300 MG PO CAPS
300.0000 mg | ORAL_CAPSULE | ORAL | Status: DC
  Filled 2021-09-17: qty 1

## 2021-09-17 MED ORDER — ORAL CARE MOUTH RINSE: 15.0000 mL | Freq: Once | OROMUCOSAL | Status: DC

## 2021-09-17 SURGICAL SUPPLY — 99 items
ADAPTER GOLDBERG URETERAL (ADAPTER) IMPLANT
BAG COUNTER SPONGE SURGICOUNT (BAG) ×2 IMPLANT
BAG URO CATCHER STRL LF (MISCELLANEOUS) ×2 IMPLANT
BLADE EXTENDED COATED 6.5IN (ELECTRODE) IMPLANT
CANNULA REDUC XI 12-8 STAPL (CANNULA)
CANNULA REDUCER 12-8 DVNC XI (CANNULA) IMPLANT
CATH URETL OPEN END 6FR 70 (CATHETERS) IMPLANT
CELLS DAT CNTRL 66122 CELL SVR (MISCELLANEOUS) IMPLANT
CLOTH BEACON ORANGE TIMEOUT ST (SAFETY) ×2 IMPLANT
COVER SURGICAL LIGHT HANDLE (MISCELLANEOUS) ×4 IMPLANT
COVER TIP SHEARS 8 DVNC (MISCELLANEOUS) ×1 IMPLANT
COVER TIP SHEARS 8MM DA VINCI (MISCELLANEOUS) ×1
DRAIN CHANNEL 19F RND (DRAIN) IMPLANT
DRAPE ARM DVNC X/XI (DISPOSABLE) ×4 IMPLANT
DRAPE COLUMN DVNC XI (DISPOSABLE) ×1 IMPLANT
DRAPE DA VINCI XI ARM (DISPOSABLE) ×4
DRAPE DA VINCI XI COLUMN (DISPOSABLE) ×1
DRAPE SURG IRRIG POUCH 19X23 (DRAPES) ×2 IMPLANT
DRSG OPSITE POSTOP 4X10 (GAUZE/BANDAGES/DRESSINGS) IMPLANT
DRSG OPSITE POSTOP 4X6 (GAUZE/BANDAGES/DRESSINGS) IMPLANT
DRSG OPSITE POSTOP 4X8 (GAUZE/BANDAGES/DRESSINGS) IMPLANT
ELECT PENCIL ROCKER SW 15FT (MISCELLANEOUS) ×2 IMPLANT
ELECT REM PT RETURN 15FT ADLT (MISCELLANEOUS) ×2 IMPLANT
ENDOLOOP SUT PDS II  0 18 (SUTURE)
ENDOLOOP SUT PDS II 0 18 (SUTURE) IMPLANT
EVACUATOR SILICONE 100CC (DRAIN) IMPLANT
GLOVE BIO SURGEON STRL SZ 6.5 (GLOVE) ×6 IMPLANT
GLOVE BIOGEL PI IND STRL 7.0 (GLOVE) ×2 IMPLANT
GLOVE BIOGEL PI INDICATOR 7.0 (GLOVE) ×2
GLOVE INDICATOR 6.5 STRL GRN (GLOVE) ×2 IMPLANT
GLOVE SURG LX 7.5 STRW (GLOVE) ×1
GLOVE SURG LX STRL 7.5 STRW (GLOVE) ×1 IMPLANT
GOWN STRL REUS W/ TWL XL LVL3 (GOWN DISPOSABLE) ×3 IMPLANT
GOWN STRL REUS W/TWL XL LVL3 (GOWN DISPOSABLE) ×3
GRASPER SUT TROCAR 14GX15 (MISCELLANEOUS) IMPLANT
GUIDEWIRE ANG ZIPWIRE 038X150 (WIRE) IMPLANT
GUIDEWIRE STR DUAL SENSOR (WIRE) IMPLANT
HOLDER FOLEY CATH W/STRAP (MISCELLANEOUS) ×2 IMPLANT
IRRIG SUCT STRYKERFLOW 2 WTIP (MISCELLANEOUS) ×2
IRRIGATION SUCT STRKRFLW 2 WTP (MISCELLANEOUS) ×1 IMPLANT
KIT PROCEDURE DA VINCI SI (MISCELLANEOUS) ×1
KIT PROCEDURE DVNC SI (MISCELLANEOUS) ×1 IMPLANT
KIT TURNOVER KIT A (KITS) IMPLANT
MANIFOLD NEPTUNE II (INSTRUMENTS) ×2 IMPLANT
NEEDLE INSUFFLATION 14GA 120MM (NEEDLE) ×2 IMPLANT
PACK CARDIOVASCULAR III (CUSTOM PROCEDURE TRAY) ×2 IMPLANT
PACK COLON (CUSTOM PROCEDURE TRAY) ×2 IMPLANT
PACK CYSTO (CUSTOM PROCEDURE TRAY) ×2 IMPLANT
PAD POSITIONING PINK XL (MISCELLANEOUS) ×2 IMPLANT
RELOAD STAPLER 3.5X60 BLU DVNC (STAPLE) IMPLANT
RELOAD STAPLER 4.3X60 GRN DVNC (STAPLE) IMPLANT
RTRCTR WOUND ALEXIS 18CM MED (MISCELLANEOUS)
SCISSORS LAP 5X35 DISP (ENDOMECHANICALS) IMPLANT
SEAL CANN UNIV 5-8 DVNC XI (MISCELLANEOUS) ×3 IMPLANT
SEAL XI 5MM-8MM UNIVERSAL (MISCELLANEOUS) ×3
SEALER VESSEL DA VINCI XI (MISCELLANEOUS) ×1
SEALER VESSEL EXT DVNC XI (MISCELLANEOUS) ×1 IMPLANT
SOLUTION ELECTROLUBE (MISCELLANEOUS) ×2 IMPLANT
SPIKE FLUID TRANSFER (MISCELLANEOUS) IMPLANT
STAPLER 60 DA VINCI SURE FORM (STAPLE)
STAPLER 60 SUREFORM DVNC (STAPLE) IMPLANT
STAPLER CANNULA SEAL DVNC XI (STAPLE) IMPLANT
STAPLER CANNULA SEAL XI (STAPLE)
STAPLER ECHELON POWER CIR 29 (STAPLE) IMPLANT
STAPLER ECHELON POWER CIR 31 (STAPLE) IMPLANT
STAPLER RELOAD 3.5X60 BLU DVNC (STAPLE)
STAPLER RELOAD 3.5X60 BLUE (STAPLE)
STAPLER RELOAD 4.3X60 GREEN (STAPLE)
STAPLER RELOAD 4.3X60 GRN DVNC (STAPLE)
STOPCOCK 4 WAY LG BORE MALE ST (IV SETS) ×4 IMPLANT
SUT ETHILON 2 0 PS N (SUTURE) IMPLANT
SUT NOVA NAB DX-16 0-1 5-0 T12 (SUTURE) ×4 IMPLANT
SUT PROLENE 2 0 KS (SUTURE) IMPLANT
SUT SILK 2 0 (SUTURE) ×1
SUT SILK 2 0 SH CR/8 (SUTURE) IMPLANT
SUT SILK 2-0 18XBRD TIE 12 (SUTURE) ×1 IMPLANT
SUT SILK 3 0 (SUTURE)
SUT SILK 3 0 SH CR/8 (SUTURE) ×2 IMPLANT
SUT SILK 3-0 18XBRD TIE 12 (SUTURE) IMPLANT
SUT V-LOC BARB 180 2/0GR6 GS22 (SUTURE)
SUT VIC AB 2-0 SH 18 (SUTURE) IMPLANT
SUT VIC AB 2-0 SH 27 (SUTURE)
SUT VIC AB 2-0 SH 27X BRD (SUTURE) IMPLANT
SUT VIC AB 3-0 SH 18 (SUTURE) IMPLANT
SUT VIC AB 4-0 PS2 27 (SUTURE) ×4 IMPLANT
SUT VICRYL 0 UR6 27IN ABS (SUTURE) ×2 IMPLANT
SUTURE V-LC BRB 180 2/0GR6GS22 (SUTURE) IMPLANT
SYR 10ML ECCENTRIC (SYRINGE) ×2 IMPLANT
SYS LAPSCP GELPORT 120MM (MISCELLANEOUS)
SYS WOUND ALEXIS 18CM MED (MISCELLANEOUS)
SYSTEM LAPSCP GELPORT 120MM (MISCELLANEOUS) IMPLANT
SYSTEM WOUND ALEXIS 18CM MED (MISCELLANEOUS) IMPLANT
TOWEL OR 17X26 10 PK STRL BLUE (TOWEL DISPOSABLE) IMPLANT
TOWEL OR NON WOVEN STRL DISP B (DISPOSABLE) ×2 IMPLANT
TRAY FOLEY MTR SLVR 16FR STAT (SET/KITS/TRAYS/PACK) ×2 IMPLANT
TROCAR ADV FIXATION 5X100MM (TROCAR) ×2 IMPLANT
TUBING CONNECTING 10 (TUBING) ×6 IMPLANT
TUBING INSUFFLATION 10FT LAP (TUBING) ×2 IMPLANT
TUBING UROLOGY SET (TUBING) IMPLANT

## 2021-09-17 NOTE — Progress Notes (Signed)
Patient did not complete bowel prep. Dr Marcello Moores spoke with patient. Case is cancelled and office will follow up. Patient aware.  ?

## 2021-09-17 NOTE — Progress Notes (Signed)
Pt did not complete bowel prep, charge nurse called to make surgeon aware. ?

## 2021-09-17 NOTE — Anesthesia Preprocedure Evaluation (Addendum)
Anesthesia Evaluation  ?Patient identified by MRN, date of birth, ID band ?Patient awake ? ? ? ?Reviewed: ?Allergy & Precautions, NPO status , Patient's Chart, lab work & pertinent test results, reviewed documented beta blocker date and time  ? ?Airway ?Mallampati: II ? ?TM Distance: >3 FB ?Neck ROM: Full ? ? ? Dental ? ?(+) Dental Advisory Given, Poor Dentition, Chipped, Missing,  ?  ?Pulmonary ?neg pulmonary ROS,  ?  ?Pulmonary exam normal ?breath sounds clear to auscultation ? ? ? ? ? ? Cardiovascular ?hypertension, Pt. on home beta blockers and Pt. on medications ?+ CAD, + Past MI, + CABG and +CHF  ?Normal cardiovascular exam ?Rhythm:Regular Rate:Normal ? ?Echocardiogram 03/23/2020:  ?Normal LV systolic function with visual EF 50-55%. Abnormal septal wall  ?motion due to post-operative coronary artery bypass graft. Left ventricle  ?cavity is normal in size. Normal global wall motion. Normal diastolic  ?filling pattern, normal LAP. Calculated EF 54%.  ?Structurally normal tricuspid valve. No evidence of tricuspid stenosis.  ?Mild tricuspid regurgitation. No evidence of pulmonary hypertension.  ?No other significant valvular abnormalities are seen.  ?Compared to prior study dated 2014: no significant change noted.  ?  ?Neuro/Psych ?negative neurological ROS ?   ? GI/Hepatic ?Neg liver ROS, GERD  Medicated,  ?Endo/Other  ?negative endocrine ROS ? Renal/GU ?Renal disease (IgA Nephropathy)  ? ?  ?Musculoskeletal ? ?(+) Arthritis ,  ? Abdominal ?  ?Peds ? Hematology ? ?(+) Blood dyscrasia (Thrombocytopenia), anemia ,   ?Anesthesia Other Findings ?Day of surgery medications reviewed with the patient. ? Reproductive/Obstetrics ? ?  ? ? ? ? ? ? ? ? ? ? ? ? ? ?  ?  ? ? ? ? ? ? ? ? ?Anesthesia Physical ?Anesthesia Plan ? ?ASA: 3 ? ?Anesthesia Plan: General  ? ?Post-op Pain Management: Tylenol PO (pre-op)*  ? ?Induction: Intravenous ? ?PONV Risk Score and Plan: 2 and Midazolam,  Dexamethasone and Ondansetron ? ?Airway Management Planned: Oral ETT ? ?Additional Equipment: Arterial line ? ?Intra-op Plan:  ? ?Post-operative Plan: Extubation in OR ? ?Informed Consent: I have reviewed the patients History and Physical, chart, labs and discussed the procedure including the risks, benefits and alternatives for the proposed anesthesia with the patient or authorized representative who has indicated his/her understanding and acceptance.  ? ? ? ?Dental advisory given ? ?Plan Discussed with: CRNA ? ?Anesthesia Plan Comments:   ? ? ? ? ? ? ?Anesthesia Quick Evaluation ? ?

## 2021-10-25 ENCOUNTER — Other Ambulatory Visit (HOSPITAL_COMMUNITY): Payer: Self-pay

## 2021-10-26 ENCOUNTER — Other Ambulatory Visit: Payer: Self-pay | Admitting: Nurse Practitioner

## 2021-10-26 ENCOUNTER — Other Ambulatory Visit (HOSPITAL_COMMUNITY): Payer: Self-pay

## 2021-10-29 NOTE — Progress Notes (Addendum)
DUE TO COVID-19 ONLY  2 VISITOR IS ALLOWED TO COME WITH YOU AND STAY IN THE WAITING ROOM ONLY DURING PRE OP AND PROCEDURE DAY OF SURGERY.   4 VISITOR  MAY VISIT WITH YOU AFTER SURGERY IN YOUR PRIVATE ROOM DURING VISITING HOURS ONLY! YOU MAY HAVE ONE PERSON SPEND THE NITE WITH YOU IN YOUR ROOM AFTER SURGERY.     Your procedure is scheduled on:        11/10/21   Report to [facility] Main  Entrance   Report to admitting at      1015            AM DO NOT BRING INSURANCE CARD, PICTURE ID OR WALLET DAY OF SURGERY.      Call this number if you have problems the morning of surgery [phone]  Follow bowel prep per MD Clear liquid diet on day of bowel prep.    REMEMBER: NO  SOLID FOODS , CANDY, GUM OR MINTS AFTER MIDNITE THE NITE BEFORE SURGERY .     Complete 2 presurgery ensure drinks at 1000pm the nite before surgery.    Mar[place] Kitchen CLEAR LIQUIDS UNTIL    0930am             DAY OF SURGERY.      PLEASE FINISH ENSURE DRINK PER SURGEON ORDER  WHICH NEEDS TO BE COMPLETED AT     0930 am       MORNING OF SURGERY.       CLEAR LIQUID DIET   Foods Allowed      WATER BLACK COFFEE ( [place]GAR [place], NO MILK, CREAM OR CREAMER) REGULAR AND DECAF  TEA ( [place] NO MILK, CREAM, OR CREAMER) REGULAR AND DECAF  PLAIN JELLO ( NO RED)  FRUIT ICES ( NO RED, NO FRUIT PULP)  POPSICLES ( NO RED)  JUICE- APPLE, WHITE GRAPE AND WHITE CRANBERRY  SPORT DRINK LIKE GATORADE ( NO RED)  CLEAR BROTH ( VEGETABLE , CHICKEN OR BEEF)                                                                     BRUSH YOUR TEETH MORNING OF SURGERY AND RINSE YOUR MOUTH OUT, NO CHEWING GUM CANDY OR MINTS.     Take these medicines the morning of surgery with A SIP OF WATER:   Atenolol, omeprazole   DO NOT TAKE ANY DIABETIC MEDICATIONS DAY OF YOUR SURGERY                               You may not have any metal on your body including hair pins and              piercings  Do not wear jewelry, make-up, lotions, powders or perfumes,  deodorant             Do not wear nail polish on your fingernails.              IF YOU ARE A MALE AND WANT TO SHAVE UNDER ARMS OR LEGS PRIOR TO SURGERY YOU MUST DO SO AT LEAST 48 HOURS PRIOR TO SURGERY.  Men may shave face and neck.   Do not bring valuables to the hospital. [place].  Contacts, dentures or bridgework may not be worn into surgery.  Leave suitcase in the car. After surgery it may be brought to your room.     Patients discharged the day of surgery will not be allowed to drive home. IF YOU ARE HAVING SURGERY AND GOING HOME THE SAME DAY, YOU MUST HAVE AN ADULT TO DRIVE YOU HOME AND BE WITH YOU FOR 24 HOURS. YOU MAY GO HOME BY TAXI OR UBER OR ORTHERWISE, BUT AN ADULT MUST ACCOMPANY YOU HOME AND STAY WITH YOU FOR 24 HOURS.                Please read over the following fact sheets you were given: _____________________________________________________________________  [facility] - Preparing for Surgery Before surgery, you can play an important role.  Because skin is not sterile, your skin needs to be as free of germs as possible.  You can reduce the number of germs on your skin by washing with CHG (chlorahexidine gluconate) soap before surgery.  CHG is an antiseptic cleaner which kills germs and bonds with the skin to continue killing germs even after washing. Please DO NOT use if you have an allergy to CHG or antibacterial soaps.  If your skin becomes reddened/irritated stop using the CHG and inform your nurse when you arrive at Short Stay. Do not shave (including legs and underarms) for at least 48 hours prior to the first CHG shower.  You may shave your face/neck. Please follow these instructions carefully:  1.  Shower with CHG Soap the night before surgery and the  morning of Surgery.  2.  If you choose to wash your hair, wash your hair first as usual with your  normal  shampoo.  3.  After you shampoo, rinse your hair  and body thoroughly to remove the  shampoo.                           4.  Use CHG as you would any other liquid soap.  You can apply chg directly  to the skin and wash                       Gently with a scrungie or clean washcloth.  5.  Apply the CHG Soap to your body ONLY FROM THE NECK DOWN.   Do not use on face/ open                           Wound or open sores. Avoid contact with eyes, ears mouth and genitals (private parts).                       Wash face,  Genitals (private parts) with your normal soap.             6.  Wash thoroughly, paying special attention to the area where your surgery  will be performed.  7.  Thoroughly rinse your body with warm water from the neck down.  8.  DO NOT shower/wash with your normal soap after using and rinsing off  the CHG Soap.                9.  Pat yourself dry with a clean towel.            10.  Wear clean pajamas.            11.  Place clean sheets on your bed the night of your first shower and do not  sleep with pets. Day of Surgery : Do not apply any lotions/deodorants the morning of surgery.  Please wear clean clothes to the hospital/surgery center.  FAILURE TO FOLLOW THESE INSTRUCTIONS MAY RESULT IN THE CANCELLATION OF YOUR SURGERY PATIENT SIGNATURE_________________________________  NURSE SIGNATURE__________________________________  ________________________________________________________________________

## 2021-10-29 NOTE — Progress Notes (Signed)
Anesthesia Review:  PCP: Cardiologist : Chest x-ray : EKG : 08/11/21  Echo : 2021  06/12/21- Ct chest  Stress test: 2021  Cardiac Cath :  Activity level:  Sleep Study/ CPAP : Fasting Blood Sugar :      / Checks Blood Sugar -- times a day:   Blood Thinner/ Instructions /Last Dose: ASA / Instructions/ Last Dose :   Ostomy nurse consult

## 2021-11-01 ENCOUNTER — Other Ambulatory Visit: Payer: Self-pay

## 2021-11-01 ENCOUNTER — Encounter (HOSPITAL_COMMUNITY): Payer: Self-pay

## 2021-11-01 ENCOUNTER — Encounter (HOSPITAL_COMMUNITY)
Admission: RE | Admit: 2021-11-01 | Discharge: 2021-11-01 | Disposition: A | Discharge status: Home / Self Care | Payer: Medicare Other | Source: Ambulatory Visit | Attending: General Surgery | Admitting: General Surgery

## 2021-11-01 VITALS — BP 140/89 | HR 67 | Temp 98.2°F | Resp 16 | Ht 68.0 in | Wt 138.0 lb

## 2021-11-01 DIAGNOSIS — Z01812 Encounter for preprocedural laboratory examination: Secondary | ICD-10-CM | POA: Insufficient documentation

## 2021-11-01 DIAGNOSIS — Z01818 Encounter for other preprocedural examination: Secondary | ICD-10-CM

## 2021-11-01 DIAGNOSIS — R7303 Prediabetes: Secondary | ICD-10-CM | POA: Diagnosis not present

## 2021-11-01 LAB — BASIC METABOLIC PANEL
Anion gap: 11 (ref 5–15)
BUN: 28 mg/dL — ABNORMAL HIGH (ref 8–23)
CO2: 23 mmol/L (ref 22–32)
Calcium: 6.2 mg/dL — CL (ref 8.9–10.3)
Chloride: 108 mmol/L (ref 98–111)
Creatinine, Ser: 1.65 mg/dL — ABNORMAL HIGH (ref 0.61–1.24)
GFR, Estimated: 43 mL/min — ABNORMAL LOW (ref >60)
Glucose, Bld: 107 mg/dL — ABNORMAL HIGH (ref 70–99)
Potassium: 3.6 mmol/L (ref 3.5–5.1)
Sodium: 142 mmol/L (ref 135–145)

## 2021-11-01 LAB — CBC
HCT: 34.8 % — ABNORMAL LOW (ref 39.0–52.0)
Hemoglobin: 11.4 g/dL — ABNORMAL LOW (ref 13.0–17.0)
MCH: 31.2 pg (ref 26.0–34.0)
MCHC: 32.8 g/dL (ref 30.0–36.0)
MCV: 95.3 fL (ref 80.0–100.0)
Platelets: 159 10*3/uL (ref 150–400)
RBC: 3.65 MIL/uL — ABNORMAL LOW (ref 4.22–5.81)
RDW: 13.5 % (ref 11.5–15.5)
WBC: 7 10*3/uL (ref 4.0–10.5)
nRBC: 0 % (ref 0.0–0.2)

## 2021-11-01 LAB — GLUCOSE, CAPILLARY: Glucose-Capillary: 112 mg/dL — ABNORMAL HIGH (ref 70–99)

## 2021-11-01 LAB — HEMOGLOBIN A1C
Hgb A1c MFr Bld: 5.8 % — ABNORMAL HIGH (ref 4.8–5.6)
Mean Plasma Glucose: 119.76 mg/dL

## 2021-11-01 NOTE — Consult Note (Signed)
[place] Nurse requested for preoperative stoma site marking Patient has had previous ileostomy   Discussed surgical procedure and stoma creation with patient. Explained role of the [place] nurse team.  Provided the patient with educational booklet and provided samples of pouching options.   Examined patient sitting, and standing in order to place the marking in the patient's visual field, away from any creases or abdominal contour issues and within the rectus muscle.  Patient to have permanent ostomy  Marked for colostomy in the LLQ  __3_ cm to the left of the umbilicus and __5_ZD above/below the umbilicus.   Patient's abdomen cleansed with CHG wipes at site markings, allowed to air dry prior to marking.Covered mark with thin film transparent dressing to preserve mark until date of surgery.   [place] Nurse team will follow up with patient after surgery for continue ostomy care and teaching.  [place] MSN, [place], [place], [place]

## 2021-11-01 NOTE — Progress Notes (Addendum)
CRITICAL RESULT PROVIDER NOTIFICATION  Test performed and critical result:  BMP calcium 6.2 on 11/01/21   Date and time result received:  11/01/21 1206pm   Provider name/title: DR Leighton Ruff, Dr Link Snuffer   Date and time provider notified: 11/01/21 1206 pm routed to Dr Marcello Moores and DR Gloriann Loan .   Informed [place], Baik in preop on 11/01/21 at 1207 pm  Date and time provider responded: 11/01/21 1207 pm   Provider response:Jessica Landstrom,PAC made aware on 11/01/21 at 1207 Calcium was 6.2.  Jessica Wierzba, PAc made aware have routed to Dr Marcello Moores and DR Gloriann Loan  No new orders given.  Jjessica Meara,PAc stated she would handle.

## 2021-11-02 ENCOUNTER — Encounter (HOSPITAL_COMMUNITY): Payer: Self-pay | Admitting: Physician Assistant

## 2021-11-03 ENCOUNTER — Other Ambulatory Visit: Payer: Self-pay | Admitting: General Surgery

## 2021-11-10 ENCOUNTER — Inpatient Hospital Stay (HOSPITAL_COMMUNITY): Admission: RE | Admit: 2021-11-10 | Payer: Medicare Other | Source: Home / Self Care | Admitting: General Surgery

## 2021-11-10 DIAGNOSIS — Z01818 Encounter for other preprocedural examination: Secondary | ICD-10-CM

## 2021-11-10 LAB — TYPE AND SCREEN
ABO/RH(D): A POS
Antibody Screen: NEGATIVE

## 2021-11-10 SURGERY — RESECTION, RECTUM, LOW ANTERIOR, ROBOT-ASSISTED
Anesthesia: General

## 2021-11-16 ENCOUNTER — Other Ambulatory Visit: Payer: Self-pay | Admitting: Urology

## 2021-11-16 ENCOUNTER — Ambulatory Visit: Payer: Self-pay | Admitting: General Surgery

## 2021-11-21 ENCOUNTER — Other Ambulatory Visit: Payer: Self-pay

## 2021-11-21 ENCOUNTER — Emergency Department (HOSPITAL_COMMUNITY): Payer: Medicare Other

## 2021-11-21 ENCOUNTER — Inpatient Hospital Stay (HOSPITAL_COMMUNITY)
Admission: EM | Admit: 2021-11-21 | Discharge: 2021-12-01 | DRG: 166 | Disposition: A | Discharge status: Skilled Nursing Facility | Payer: Medicare Other | Attending: Family Medicine | Admitting: Family Medicine

## 2021-11-21 ENCOUNTER — Encounter (HOSPITAL_COMMUNITY): Payer: Self-pay

## 2021-11-21 DIAGNOSIS — K5909 Other constipation: Secondary | ICD-10-CM | POA: Diagnosis present

## 2021-11-21 DIAGNOSIS — C19 Malignant neoplasm of rectosigmoid junction: Secondary | ICD-10-CM | POA: Diagnosis present

## 2021-11-21 DIAGNOSIS — N320 Bladder-neck obstruction: Secondary | ICD-10-CM | POA: Diagnosis present

## 2021-11-21 DIAGNOSIS — C189 Malignant neoplasm of colon, unspecified: Principal | ICD-10-CM

## 2021-11-21 DIAGNOSIS — Z8249 Family history of ischemic heart disease and other diseases of the circulatory system: Secondary | ICD-10-CM

## 2021-11-21 DIAGNOSIS — K219 Gastro-esophageal reflux disease without esophagitis: Secondary | ICD-10-CM | POA: Diagnosis present

## 2021-11-21 DIAGNOSIS — E785 Hyperlipidemia, unspecified: Secondary | ICD-10-CM | POA: Diagnosis present

## 2021-11-21 DIAGNOSIS — Z7982 Long term (current) use of aspirin: Secondary | ICD-10-CM

## 2021-11-21 DIAGNOSIS — N132 Hydronephrosis with renal and ureteral calculous obstruction: Secondary | ICD-10-CM | POA: Diagnosis present

## 2021-11-21 DIAGNOSIS — I509 Heart failure, unspecified: Secondary | ICD-10-CM | POA: Diagnosis present

## 2021-11-21 DIAGNOSIS — R110 Nausea: Secondary | ICD-10-CM

## 2021-11-21 DIAGNOSIS — D631 Anemia in chronic kidney disease: Secondary | ICD-10-CM | POA: Diagnosis present

## 2021-11-21 DIAGNOSIS — Z79899 Other long term (current) drug therapy: Secondary | ICD-10-CM

## 2021-11-21 DIAGNOSIS — K5730 Diverticulosis of large intestine without perforation or abscess without bleeding: Secondary | ICD-10-CM | POA: Diagnosis present

## 2021-11-21 DIAGNOSIS — E43 Unspecified severe protein-calorie malnutrition: Secondary | ICD-10-CM | POA: Insufficient documentation

## 2021-11-21 DIAGNOSIS — R7989 Other specified abnormal findings of blood chemistry: Secondary | ICD-10-CM

## 2021-11-21 DIAGNOSIS — Z955 Presence of coronary angioplasty implant and graft: Secondary | ICD-10-CM

## 2021-11-21 DIAGNOSIS — M5136 Other intervertebral disc degeneration, lumbar region: Secondary | ICD-10-CM | POA: Diagnosis present

## 2021-11-21 DIAGNOSIS — N179 Acute kidney failure, unspecified: Secondary | ICD-10-CM | POA: Diagnosis not present

## 2021-11-21 DIAGNOSIS — N028 Recurrent and persistent hematuria with other morphologic changes: Secondary | ICD-10-CM | POA: Diagnosis present

## 2021-11-21 DIAGNOSIS — I10 Essential (primary) hypertension: Secondary | ICD-10-CM | POA: Diagnosis present

## 2021-11-21 DIAGNOSIS — C20 Malignant neoplasm of rectum: Secondary | ICD-10-CM

## 2021-11-21 DIAGNOSIS — Z681 Body mass index (BMI) 19 or less, adult: Secondary | ICD-10-CM

## 2021-11-21 DIAGNOSIS — Z8042 Family history of malignant neoplasm of prostate: Secondary | ICD-10-CM

## 2021-11-21 DIAGNOSIS — I5032 Chronic diastolic (congestive) heart failure: Secondary | ICD-10-CM | POA: Diagnosis present

## 2021-11-21 DIAGNOSIS — N1832 Chronic kidney disease, stage 3b: Secondary | ICD-10-CM

## 2021-11-21 DIAGNOSIS — Z951 Presence of aortocoronary bypass graft: Secondary | ICD-10-CM

## 2021-11-21 DIAGNOSIS — E860 Dehydration: Secondary | ICD-10-CM | POA: Diagnosis present

## 2021-11-21 DIAGNOSIS — Z20822 Contact with and (suspected) exposure to covid-19: Secondary | ICD-10-CM | POA: Diagnosis present

## 2021-11-21 DIAGNOSIS — M545 Low back pain, unspecified: Secondary | ICD-10-CM

## 2021-11-21 DIAGNOSIS — D6959 Other secondary thrombocytopenia: Secondary | ICD-10-CM | POA: Diagnosis not present

## 2021-11-21 DIAGNOSIS — E876 Hypokalemia: Secondary | ICD-10-CM | POA: Diagnosis present

## 2021-11-21 DIAGNOSIS — R7303 Prediabetes: Secondary | ICD-10-CM | POA: Diagnosis present

## 2021-11-21 DIAGNOSIS — C7801 Secondary malignant neoplasm of right lung: Secondary | ICD-10-CM | POA: Diagnosis not present

## 2021-11-21 DIAGNOSIS — R911 Solitary pulmonary nodule: Secondary | ICD-10-CM | POA: Diagnosis present

## 2021-11-21 DIAGNOSIS — K8020 Calculus of gallbladder without cholecystitis without obstruction: Secondary | ICD-10-CM | POA: Diagnosis present

## 2021-11-21 DIAGNOSIS — I248 Other forms of acute ischemic heart disease: Secondary | ICD-10-CM | POA: Diagnosis present

## 2021-11-21 DIAGNOSIS — Z801 Family history of malignant neoplasm of trachea, bronchus and lung: Secondary | ICD-10-CM

## 2021-11-21 DIAGNOSIS — N134 Hydroureter: Secondary | ICD-10-CM | POA: Diagnosis present

## 2021-11-21 DIAGNOSIS — S32028D Other fracture of second lumbar vertebra, subsequent encounter for fracture with routine healing: Secondary | ICD-10-CM

## 2021-11-21 DIAGNOSIS — F03911 Unspecified dementia, unspecified severity, with agitation: Secondary | ICD-10-CM | POA: Diagnosis present

## 2021-11-21 DIAGNOSIS — Z923 Personal history of irradiation: Secondary | ICD-10-CM

## 2021-11-21 DIAGNOSIS — Z841 Family history of disorders of kidney and ureter: Secondary | ICD-10-CM

## 2021-11-21 DIAGNOSIS — N1330 Unspecified hydronephrosis: Secondary | ICD-10-CM

## 2021-11-21 DIAGNOSIS — I480 Paroxysmal atrial fibrillation: Secondary | ICD-10-CM

## 2021-11-21 DIAGNOSIS — F05 Delirium due to known physiological condition: Secondary | ICD-10-CM | POA: Diagnosis not present

## 2021-11-21 DIAGNOSIS — R778 Other specified abnormalities of plasma proteins: Secondary | ICD-10-CM

## 2021-11-21 DIAGNOSIS — G9341 Metabolic encephalopathy: Secondary | ICD-10-CM | POA: Diagnosis present

## 2021-11-21 DIAGNOSIS — I252 Old myocardial infarction: Secondary | ICD-10-CM

## 2021-11-21 DIAGNOSIS — N182 Chronic kidney disease, stage 2 (mild): Secondary | ICD-10-CM | POA: Diagnosis present

## 2021-11-21 DIAGNOSIS — I130 Hypertensive heart and chronic kidney disease with heart failure and stage 1 through stage 4 chronic kidney disease, or unspecified chronic kidney disease: Secondary | ICD-10-CM | POA: Diagnosis present

## 2021-11-21 DIAGNOSIS — R159 Full incontinence of feces: Secondary | ICD-10-CM | POA: Diagnosis present

## 2021-11-21 DIAGNOSIS — C7800 Secondary malignant neoplasm of unspecified lung: Secondary | ICD-10-CM | POA: Diagnosis present

## 2021-11-21 DIAGNOSIS — I2510 Atherosclerotic heart disease of native coronary artery without angina pectoris: Secondary | ICD-10-CM | POA: Diagnosis not present

## 2021-11-21 DIAGNOSIS — Z781 Physical restraint status: Secondary | ICD-10-CM

## 2021-11-21 DIAGNOSIS — K529 Noninfective gastroenteritis and colitis, unspecified: Secondary | ICD-10-CM | POA: Diagnosis present

## 2021-11-21 DIAGNOSIS — R531 Weakness: Secondary | ICD-10-CM

## 2021-11-21 LAB — URINALYSIS, ROUTINE W REFLEX MICROSCOPIC
Bacteria, UA: NONE SEEN
Bilirubin Urine: NEGATIVE
Glucose, UA: NEGATIVE mg/dL
Ketones, ur: 20 mg/dL — AB
Leukocytes,Ua: NEGATIVE
Nitrite: NEGATIVE
Protein, ur: 100 mg/dL — AB
Specific Gravity, Urine: 1.014 (ref 1.005–1.030)
pH: 5 (ref 5.0–8.0)

## 2021-11-21 LAB — CBC
HCT: 36.6 % — ABNORMAL LOW (ref 39.0–52.0)
Hemoglobin: 12.1 g/dL — ABNORMAL LOW (ref 13.0–17.0)
MCH: 31.7 pg (ref 26.0–34.0)
MCHC: 33.1 g/dL (ref 30.0–36.0)
MCV: 95.8 fL (ref 80.0–100.0)
Platelets: 165 10*3/uL (ref 150–400)
RBC: 3.82 MIL/uL — ABNORMAL LOW (ref 4.22–5.81)
RDW: 13.3 % (ref 11.5–15.5)
WBC: 10.7 10*3/uL — ABNORMAL HIGH (ref 4.0–10.5)
nRBC: 0 % (ref 0.0–0.2)

## 2021-11-21 LAB — COMPREHENSIVE METABOLIC PANEL
ALT: 23 U/L (ref 0–44)
AST: 24 U/L (ref 15–41)
Albumin: 3.3 g/dL — ABNORMAL LOW (ref 3.5–5.0)
Alkaline Phosphatase: 67 U/L (ref 38–126)
Anion gap: 13 (ref 5–15)
BUN: 24 mg/dL — ABNORMAL HIGH (ref 8–23)
CO2: 23 mmol/L (ref 22–32)
Calcium: 6.7 mg/dL — ABNORMAL LOW (ref 8.9–10.3)
Chloride: 107 mmol/L (ref 98–111)
Creatinine, Ser: 1.32 mg/dL — ABNORMAL HIGH (ref 0.61–1.24)
GFR, Estimated: 56 mL/min — ABNORMAL LOW (ref >60)
Glucose, Bld: 143 mg/dL — ABNORMAL HIGH (ref 70–99)
Potassium: 3.9 mmol/L (ref 3.5–5.1)
Sodium: 143 mmol/L (ref 135–145)
Total Bilirubin: 1.1 mg/dL (ref 0.3–1.2)
Total Protein: 6.1 g/dL — ABNORMAL LOW (ref 6.5–8.1)

## 2021-11-21 LAB — MAGNESIUM: Magnesium: <0.5 mg/dL — CL (ref 1.7–2.4)

## 2021-11-21 LAB — TROPONIN I (HIGH SENSITIVITY): Troponin I (High Sensitivity): 108 ng/L (ref <18)

## 2021-11-21 MED ORDER — LACTATED RINGERS IV SOLN: INTRAVENOUS | Status: AC

## 2021-11-21 MED ORDER — ENSURE ENLIVE PO LIQD
237.0000 mL | Freq: Two times a day (BID) | ORAL | Status: DC
  Administered 2021-11-22 – 2021-11-29 (×12): 237 mL via ORAL

## 2021-11-21 MED ORDER — ONDANSETRON HCL 4 MG/2ML IJ SOLN
4.0000 mg | Freq: Four times a day (QID) | INTRAMUSCULAR | Status: DC | PRN
  Administered 2021-11-22: 4 mg via INTRAVENOUS
  Filled 2021-11-21: qty 2

## 2021-11-21 MED ORDER — CALCIUM CARBONATE ANTACID 500 MG PO CHEW
400.0000 mg | CHEWABLE_TABLET | Freq: Every day | ORAL | Status: DC
  Administered 2021-11-22 – 2021-12-01 (×8): 400 mg via ORAL
  Filled 2021-11-21 (×10): qty 2

## 2021-11-21 MED ORDER — CALCIUM GLUCONATE 10 % IV SOLN
1.0000 g | Freq: Once | INTRAVENOUS | Status: AC
  Administered 2021-11-21: 1 g via INTRAVENOUS
  Filled 2021-11-21: qty 10

## 2021-11-21 MED ORDER — ENOXAPARIN SODIUM 40 MG/0.4ML IJ SOSY
40.0000 mg | PREFILLED_SYRINGE | Freq: Every day | INTRAMUSCULAR | Status: DC
Start: 2021-11-22 — End: 2021-11-23
  Administered 2021-11-22: 40 mg via SUBCUTANEOUS
  Filled 2021-11-21: qty 0.4

## 2021-11-21 MED ORDER — SODIUM CHLORIDE 0.9 % IV BOLUS
1000.0000 mL | Freq: Once | INTRAVENOUS | Status: AC
  Administered 2021-11-21: 1000 mL via INTRAVENOUS

## 2021-11-21 MED ORDER — METOCLOPRAMIDE HCL 5 MG/ML IJ SOLN
10.0000 mg | Freq: Once | INTRAMUSCULAR | Status: AC
Start: 2021-11-21 — End: 2021-11-21
  Administered 2021-11-21: 10 mg via INTRAVENOUS
  Filled 2021-11-21: qty 2

## 2021-11-21 MED ORDER — MAGNESIUM SULFATE 2 GM/50ML IV SOLN
2.0000 g | Freq: Once | INTRAVENOUS | Status: AC
  Administered 2021-11-21: 2 g via INTRAVENOUS
  Filled 2021-11-21: qty 50

## 2021-11-21 MED ORDER — IOHEXOL 300 MG/ML  SOLN
80.0000 mL | Freq: Once | INTRAMUSCULAR | Status: AC | PRN
  Administered 2021-11-21: 80 mL via INTRAVENOUS

## 2021-11-21 NOTE — ED Provider Notes (Signed)
Patient signed to me by Dr. Ashok Cordia pending results of abdominal CT which showed hydronephrosis but no evidence of obstruction.  Urinalysis was negative for infection.  He is hypocalcemic here.  Will order calcium replacement here he has been given IV fluids.  Will require admission for observation for management of dehydration and hypocalcemia.  Will consult hospitalist   Lacretia Leigh, MD 11/21/21 2219

## 2021-11-21 NOTE — ED Triage Notes (Addendum)
Pt BIB EMS after having constant nausea and dry heaving for the past two days. Pt does have a hx of colon cancer, but is currently in remission. Pt complains of feeling fatigued and having general malaise as well as not eating. Patient has also had intermittent diarrhea and constipation as well.   VSS w/ EMS.   EMS gave '4mg'$  zofran on route.

## 2021-11-21 NOTE — H&P (Signed)
History and Physical    Patient: Jared White FUX:[id number] DOB: [birth year] DOA: 11/21/2021 DOS: the patient was seen and examined on 11/22/2021 PCP: Caprice Red, MD  Patient coming from: Home  Chief Complaint:  Chief Complaint  Patient presents with   Nausea   Weakness   HPI: Jared White is a 76 y.o. male with medical history significant of rectal mass s/p radiation, neoadjuvant treatment, diverting ileostomy s/p reversal with recent anastomotic recurrence, chronic diarrhea, hypocalcemia, CAD s/p CABG, CKD3b, HTN,HLD who presents with increasing weakness and decreased urine output.  Patient reports for the past few days has been feeling very weak with unsteady gait.  Has dry heaves.  He has chronic diarrhea due to his colon cancer and has low appetite and been only able to tolerate liquid diet.  He is on chronic calcium and magnesium. Has also noted right paralumbar pain for the past 2-3 weeks. Worse with movement and better at rest.   In the ED, he was afebrile, tachycardic, hypertensive up to SBP of 180 on room air.  Leukocytosis of 10.7, hemoglobin of 12 which appears more hemoconcentrated compared to his baseline. Sodium of 143, K of 3.9, creatinine of 1.32 with unclear baseline but has been seen as low as 0.93.  Calcium of 6.7 which appears to be chronically low.  Albumin level 3.3. UA is negative.  CT head negative.  CT abdomen/pelvis showed symmetric bilateral hydroureteronephrosis, ureters dilated to the bladder insertion.  No urethral stone or external cause of obstruction.  Moderately distended urinary bladder with bladder volume of 470 cm.  Review of Systems: As mentioned in the history of present illness. All other systems reviewed and are negative. Past Medical History:  Diagnosis Date   Arthritis    Blood transfusion without reported diagnosis    Broken back    CAD (coronary artery disease)    PCI & CABG   Cataract    "LIGHT"-RIGHT   CHF (congestive heart failure)  ([place])    Diverticulitis    mild - approx 2004   Dysrhythmia    occasional arrythmias   Family history of heart disease    GERD (gastroesophageal reflux disease)    Heart murmur    HX OF YEARS AGO    History of kidney stones    History of nuclear stress test 06/30/2011   lexiscan; normal pattern of perfusion post-stress; low risk scan    Hyperlipidemia    Hypertension    IgA nephropathy    Irregular heart beat    Myocardial infarction ([place])    Pre-diabetes    Rectal cancer ([place]) 08/19/2019   Systemic hypertension    Past Surgical History:  Procedure Laterality Date   CARDIAC CATHETERIZATION  08/22/1995   PTCA of OM (Dr. Marella Chimes)   COLONOSCOPY     CORONARY ARTERY BYPASS GRAFT  01/22/2000   x5 - LIMA to LAD, SVG to diagonal, sequential SVG to ramus & OM, SVG to PDA (Dr. Tharon Aquas Trigt)   DIVERTING ILEOSTOMY N/A 06/10/2020   Procedure: DIVERTING LOOP ILEOSTOMY;  Surgeon: Leighton Ruff, MD;  Location: [facility];  Service: General;  Laterality: N/A;   ILEOSTOMY CLOSURE N/A 10/09/2020   Procedure: LOOP ILEOSTOMY REVERSAL;  Surgeon: Leighton Ruff, MD;  Location: [facility];  Service: General;  Laterality: N/A;   IR IMAGING GUIDED PORT INSERTION  09/04/2019   LUNG LOBECTOMY     left upper lobe   RECTAL BIOPSY N/A 07/09/2021   Procedure: BIOPSY RECTAL;  Surgeon: Marcello Moores,  Elmo Putt, MD;  Location: [facility];  Service: General;  Laterality: N/A;   RECTAL EXAM UNDER ANESTHESIA N/A 07/09/2021   Procedure: ANAL RECTAL EXAM UNDER ANESTHESIA, RIGID PROCTOSCOPY;  Surgeon: Leighton Ruff, MD;  Location: [facility];  Service: General;  Laterality: N/A;   TRANSTHORACIC ECHOCARDIOGRAM  12/24/2012   EF 55-60%, mild LVH, mild conc hypertrophy; mild MR; LA mildly dilated   XI ROBOTIC ASSISTED LOWER ANTERIOR RESECTION N/A 06/10/2020   Procedure: XI ROBOTIC ASSISTED LOWER ANTERIOR RESECTION, MOBILIZATION OF SPLENIC FLEXURE, RIGID PROCTOSCOPY;  Surgeon: Leighton Ruff, MD;  Location: [facility];  Service: General;   Laterality: N/A;   Social History:  reports that he has never smoked. He has never used smokeless tobacco. He reports that he does not drink alcohol and does not use drugs.  No Known Allergies  Family History  Problem Relation Age of Onset   Heart attack Father    Kidney failure Brother    Prostate cancer Brother    Heart attack Brother    Heart disease Brother    Hypertension Brother    Heart disease Brother    Hypertension Brother    Colon cancer Neg Hx    Esophageal cancer Neg Hx    Rectal cancer Neg Hx    Stomach cancer Neg Hx    Inflammatory bowel disease Neg Hx    Liver disease Neg Hx    Pancreatic cancer Neg Hx    Colon polyps Neg Hx     Prior to Admission medications   Medication Sig Start Date End Date Taking? Authorizing Provider  ASCORBIC ACID PO Take 1 tablet by mouth daily.    [provider]  aspirin EC 81 MG tablet Take 1 tablet (81 mg total) by mouth daily. Swallow whole. 08/11/21   Patwardhan, Reynold Bowen, MD  atenolol (TENORMIN) 50 MG tablet TAKE 1 TABLET BY MOUTH EVERY DAY 03/30/21   Patwardhan, Manish J, MD  b complex vitamins capsule Take 1 capsule by mouth daily.    [provider]  Calcium Carbonate (CALCIUM 500 PO) Take 500 mg by mouth daily.    [provider]  cholecalciferol (VITAMIN D3) 25 MCG (1000 UNIT) tablet Take 1,000 Units by mouth daily.    [provider]  MAGNESIUM PO Take 1 tablet by mouth daily.    [provider]  nitroGLYCERIN (NITROSTAT) 0.4 MG SL tablet Place 0.4 mg under the tongue every 5 (five) minutes x 3 doses as needed for chest pain. 11/16/19   [provider]  omeprazole (PRILOSEC) 40 MG capsule TAKE 1 CAPSULE (40 MG TOTAL) BY MOUTH DAILY. Patient taking differently: Take 40 mg by mouth in the morning and at bedtime. 03/03/21   Mansouraty, Telford Nab., MD  rosuvastatin (CRESTOR) 20 MG tablet TAKE 1 TABLET BY MOUTH EVERY DAY 06/02/21   Patwardhan, Manish J, MD  valsartan (DIOVAN)  160 MG tablet TAKE 1 TABLET BY MOUTH EVERY DAY 06/02/21   Patwardhan, Manish J, MD  VITAMIN A PO Take 1 tablet by mouth daily.    [provider]  vitamin E 45 MG (100 UNITS) capsule Take 100 Units by mouth daily.    [provider]    Physical Exam: Vitals:   11/21/21 2215 11/21/21 2230 11/21/21 2300 11/21/21 2330  BP: (!) 161/95 (!) 166/101 (!) 166/92 (!) 166/74  Pulse: (!) 101 85 67 80  Resp: (!) 27 (!) 24 (!) 30 (!) 39  Temp:      TempSrc:  SpO2: 100% 99% 98% 97%  Weight:      Height:       Constitutional: NAD, calm, chronically ill appearing cachetic male laying in bed and frequently restless with jerking of his extremities which he states is chronic Eyes: lids and conjunctivae normal ENMT: Mucous membranes are moist. Neck: normal, supple Respiratory: clear to auscultation bilaterally, no wheezing, no crackles. Normal respiratory effort. No accessory muscle use.  Cardiovascular: Regular rate and rhythm, no murmurs / rubs / gallops. No extremity edema.  Abdomen: soft, non-distended with mild diffuse tenderness, no masses palpated.Bowel sounds positive.  GU: foley catheter in place with urine output Musculoskeletal: no clubbing / cyanosis. No joint deformity upper and lower extremities. Good ROM, no contractures. Normal muscle tone.  Skin: no rashes, lesions, ulcers.  Neurologic: CN 2-12 grossly intact. Strength 5/5 in all 4.  Psychiatric: Normal judgment and insight. Alert and oriented x 3. Normal mood. Data Reviewed:  See HPI  Assessment and Plan: * Hypocalcemia - Replete with IV 1 g calcium gluconate and will also replete magnesium. Will increase home calcium oral supplementation.  Elevated troponin Suspect demand ischemia in the setting of dehydration with underlying history of CABG -We will obtain a second troponin to follow trend -Keep on continuous telemetry  Lumbar back pain Reported several weeks of right paralumbar spinal pain worse with  movement and better at rest. Pain is reproducible with palpation on exam.  Suspect more MSK related.  However given history of cancer, will obtain a dedicated lumbar x-ray although no osseous findings see on CT A/P other than chronic L2 deformity.  Hydroureteronephrosis Bilateral hydroureteronephrosis secondary to urinary retention. Had decrease UOP in the past few das. -Foley catheter in place. Follow intake and ouput  Acute renal failure superimposed on stage 3b chronic kidney disease (HCC) - Creatinine elevated to 1.32 with a prior creatinine of 0.93.  Patient has chronic diarrhea secondary to rectal/colon cancer and has recent urinary retention. -Continuous IV fluids overnight and follow renal function in the morning  Rectal cancer (HCC) /p radiation, neoadjuvant treatment, diverting ileostomy s/p reversal with recent anastomotic recurrence. -Has seen Dr. Marcello Moores with general surgery on 11/16/2021 with initially plans for robotic assisted abdominal perineal resection with permanent colostomy however this has been postponed due to his hypocalcemia  Hypomagnesemia Replete with IV 2g Mg. Follow repeat tomorrow.       Advance Care Planning:   Code Status: Full Code Full  Consults: none  Family Communication: brother in law at bedside  Severity of Illness: The appropriate patient status for this patient is OBSERVATION. Observation status is judged to be reasonable and necessary in order to provide the required intensity of service to ensure the patient's safety. The patient's presenting symptoms, physical exam findings, and initial radiographic and laboratory data in the context of their medical condition is felt to place them at decreased risk for further clinical deterioration. Furthermore, it is anticipated that the patient will be medically stable for discharge from the hospital within 2 midnights of admission.   Author: Orene Desanctis, DO 11/22/2021 12:00 AM  For on call review  [email or web address].

## 2021-11-21 NOTE — Assessment & Plan Note (Signed)
Suspect demand ischemia in the setting of dehydration with underlying history of CABG -We will obtain a second troponin to follow trend -Keep on continuous telemetry

## 2021-11-21 NOTE — ED Notes (Signed)
This RN and NT Lanny Hurst cleaned patient up as he is incontinent with stool. Patient had two episodes of liquid diarrhea while being cleaned. Dirty linens, including patient underwear, were changed. Patient is now sitting in bed comfortably with new linens, an adult brief, and warm blankets.

## 2021-11-21 NOTE — ED Notes (Signed)
Magnesium is less than 0.5 and troponin is 108, Dr. Angela Burke Tu notified via secure chat.

## 2021-11-21 NOTE — Assessment & Plan Note (Signed)
/  p radiation, neoadjuvant treatment, diverting ileostomy s/p reversal with recent anastomotic recurrence. -Has seen Dr. Marcello Moores with general surgery on 11/16/2021 with initially plans for robotic assisted abdominal perineal resection with permanent colostomy however this has been postponed due to his hypocalcemia

## 2021-11-21 NOTE — Assessment & Plan Note (Signed)
Replete with IV 2g Mg. Follow repeat tomorrow.

## 2021-11-21 NOTE — Assessment & Plan Note (Signed)
-   Creatinine elevated to 1.32 with a prior creatinine of 0.93.  Patient has chronic diarrhea secondary to rectal/colon cancer and has recent urinary retention. -Continuous IV fluids overnight and follow renal function in the morning

## 2021-11-21 NOTE — ED Notes (Signed)
Patient transported to CT 

## 2021-11-21 NOTE — Assessment & Plan Note (Signed)
-   Replete with IV 1 g calcium gluconate and will also replete magnesium. Will increase home calcium oral supplementation.

## 2021-11-21 NOTE — ED Provider Notes (Signed)
[facility] EMERGENCY DEPARTMENT Provider Note   CSN: [id number] Arrival date & time: 11/21/21  1607     History  Chief Complaint  Patient presents with   Nausea   Weakness    Jared White is a 76 y.o. male.  Pt with hx colon cancer, who indicates has mild diarrhea/intermittent loose stools. Denies severe diarrhea or bloody diarrhea. No abd distension or increased abd pain. Somewhat decreased appetite, denies emesis. No fever or chills. No dysuria or gu c/o. Pt relatively poor historian - level 5 caveat.   The history is provided by the patient, medical records and the EMS personnel. The history is limited by the condition of the patient.  Emesis Associated symptoms: no chills, no cough, no fever, no headaches and no sore throat        Home Medications Prior to Admission medications   Medication Sig Start Date End Date Taking? Authorizing Provider  ASCORBIC ACID PO Take 1 tablet by mouth daily.    [provider]  aspirin EC 81 MG tablet Take 1 tablet (81 mg total) by mouth daily. Swallow whole. 08/11/21   Patwardhan, Reynold Bowen, MD  atenolol (TENORMIN) 50 MG tablet TAKE 1 TABLET BY MOUTH EVERY DAY 03/30/21   Patwardhan, Manish J, MD  b complex vitamins capsule Take 1 capsule by mouth daily.    [provider]  Calcium Carbonate (CALCIUM 500 PO) Take 500 mg by mouth daily.    [provider]  cholecalciferol (VITAMIN D3) 25 MCG (1000 UNIT) tablet Take 1,000 Units by mouth daily.    [provider]  MAGNESIUM PO Take 1 tablet by mouth daily.    [provider]  nitroGLYCERIN (NITROSTAT) 0.4 MG SL tablet Place 0.4 mg under the tongue every 5 (five) minutes x 3 doses as needed for chest pain. 11/16/19   [provider]  omeprazole (PRILOSEC) 40 MG capsule TAKE 1 CAPSULE (40 MG TOTAL) BY MOUTH DAILY. Patient taking differently: Take 40 mg by mouth in the morning and at bedtime. 03/03/21   Mansouraty, Telford Nab., MD   rosuvastatin (CRESTOR) 20 MG tablet TAKE 1 TABLET BY MOUTH EVERY DAY 06/02/21   Patwardhan, Manish J, MD  valsartan (DIOVAN) 160 MG tablet TAKE 1 TABLET BY MOUTH EVERY DAY 06/02/21   Patwardhan, Manish J, MD  VITAMIN A PO Take 1 tablet by mouth daily.    [provider]  vitamin E 45 MG (100 UNITS) capsule Take 100 Units by mouth daily.    [provider]      Allergies    Patient has no known allergies.    Review of Systems   Review of Systems  Constitutional:  Negative for chills and fever.  HENT:  Negative for sore throat.   Eyes:  Negative for redness.  Respiratory:  Negative for cough and shortness of breath.   Cardiovascular:  Negative for chest pain.  Gastrointestinal:  Positive for nausea. Negative for vomiting.  Genitourinary:  Negative for dysuria and flank pain.  Musculoskeletal:  Negative for back pain.  Skin:  Negative for rash.  Neurological:  Negative for headaches.  Hematological:  Does not bruise/bleed easily.    Physical Exam Updated Vital Signs BP (!) 135/95 (BP Location: Right Arm)   Pulse 99   Temp 97.9 F (36.6 C) (Oral)   Resp 18   Ht 1.727 m ('5\' 8"'$ )   Wt 58.1 kg   SpO2 100%   BMI 19.46 kg/m  Physical Exam  Vitals and nursing note reviewed.  Constitutional:      Appearance: Normal appearance. He is well-developed.  HENT:     Head: Atraumatic.     Nose: Nose normal.     Mouth/Throat:     Mouth: Mucous membranes are moist.     Pharynx: Oropharynx is clear.  Eyes:     General: No scleral icterus.    Conjunctiva/sclera: Conjunctivae normal.  Neck:     Trachea: No tracheal deviation.  Cardiovascular:     Rate and Rhythm: Normal rate and regular rhythm.     Pulses: Normal pulses.     Heart sounds: Normal heart sounds. No murmur heard.    No friction rub. No gallop.  Pulmonary:     Effort: Pulmonary effort is normal. No accessory muscle usage or respiratory distress.     Breath sounds: Normal breath sounds.  Abdominal:      General: Bowel sounds are normal. There is no distension.     Palpations: Abdomen is soft.     Tenderness: There is no abdominal tenderness. There is no guarding.  Genitourinary:    Comments: No cva tenderness. Musculoskeletal:        General: No swelling.     Cervical back: Normal range of motion and neck supple. No rigidity.  Skin:    General: Skin is warm and dry.     Findings: No rash.  Neurological:     Mental Status: He is alert.     Comments: Alert, speech clear.   Psychiatric:        Mood and Affect: Mood normal.     ED Results / Procedures / Treatments   Labs (all labs ordered are listed, but only abnormal results are displayed) Results for orders placed or performed during the hospital encounter of 11/21/21  CBC  Result Value Ref Range   WBC 10.7 (H) 4.0 - 10.5 K/uL   RBC 3.82 (L) 4.22 - 5.81 MIL/uL   Hemoglobin 12.1 (L) 13.0 - 17.0 g/dL   HCT 36.6 (L) 39.0 - 52.0 %   MCV 95.8 80.0 - 100.0 fL   MCH 31.7 26.0 - 34.0 pg   MCHC 33.1 30.0 - 36.0 g/dL   RDW 13.3 11.5 - 15.5 %   Platelets 165 150 - 400 K/uL   nRBC 0.0 0.0 - 0.2 %  Comprehensive metabolic panel  Result Value Ref Range   Sodium 143 135 - 145 mmol/L   Potassium 3.9 3.5 - 5.1 mmol/L   Chloride 107 98 - 111 mmol/L   CO2 23 22 - 32 mmol/L   Glucose, Bld 143 (H) 70 - 99 mg/dL   BUN 24 (H) 8 - 23 mg/dL   Creatinine, Ser 1.32 (H) 0.61 - 1.24 mg/dL   Calcium 6.7 (L) 8.9 - 10.3 mg/dL   Total Protein 6.1 (L) 6.5 - 8.1 g/dL   Albumin 3.3 (L) 3.5 - 5.0 g/dL   AST 24 15 - 41 U/L   ALT 23 0 - 44 U/L   Alkaline Phosphatase 67 38 - 126 U/L   Total Bilirubin 1.1 0.3 - 1.2 mg/dL   GFR, Estimated 56 (L) >60 mL/min   Anion gap 13 5 - 15  Urinalysis, Routine w reflex microscopic Urine, Clean Catch  Result Value Ref Range   Color, Urine YELLOW YELLOW   APPearance CLEAR CLEAR   Specific Gravity, Urine 1.014 1.005 - 1.030   pH 5.0 5.0 - 8.0   Glucose, UA NEGATIVE NEGATIVE mg/dL   Hgb  urine dipstick SMALL (A)  NEGATIVE   Bilirubin Urine NEGATIVE NEGATIVE   Ketones, ur 20 (A) NEGATIVE mg/dL   Protein, ur 100 (A) NEGATIVE mg/dL   Nitrite NEGATIVE NEGATIVE   Leukocytes,Ua NEGATIVE NEGATIVE   RBC / HPF 0-5 0 - 5 RBC/hpf   WBC, UA 0-5 0 - 5 WBC/hpf   Bacteria, UA NONE SEEN NONE SEEN   Mucus PRESENT    Hyaline Casts, UA PRESENT     EKG EKG Interpretation  Date/Time:  Sunday November 21 2021 19:47:21 EDT Ventricular Rate:  93 PR Interval:  157 QRS Duration: 106 QT Interval:  397 QTC Calculation: 494 R Axis:   83 Text Interpretation: Sinus rhythm Non-specific ST-t changes Premature ventricular complexes Confirmed by Lajean Saver [phone]) on 11/21/2021 8:23:21 PM  Radiology DG ABD ACUTE 2+V W 1V CHEST  Result Date: 11/21/2021 CLINICAL DATA:  Nausea and vomiting.  Fatigue. EXAM: DG ABDOMEN ACUTE WITH 1 VIEW CHEST COMPARISON:  Chest and abdominopelvic CT 06/11/2021 FINDINGS: Right chest port in place with tip in the SVC. Post median sternotomy and CABG. Stable upper normal heart size, unchanged mediastinal contours. No acute airspace disease. No pneumothorax or pleural effusion. No free intra-abdominal air. No bowel dilatation to suggest obstruction. Enteric sutures are noted in the right abdomen. Known punctate intrarenal calculi on CT are not definitively defined by radiograph. There are vascular calcifications. The bones are diffusely under mineralized. No acute osseous abnormalities are seen. IMPRESSION: 1. No acute chest findings. 2. Normal bowel gas pattern. No free air. Electronically Signed   By: Keith Rake M.D.   On: 11/21/2021 19:02    Procedures Procedures    Medications Ordered in ED Medications  sodium chloride 0.9 % bolus 1,000 mL (has no administration in time range)    ED Course/ Medical Decision Making/ A&P                           Medical Decision Making Problems Addressed: Dehydration: acute illness or injury with systemic symptoms that poses a threat to life or bodily  functions Essential hypertension: chronic illness or injury that poses a threat to life or bodily functions Generalized weakness: acute illness or injury with systemic symptoms that poses a threat to life or bodily functions Hypocalcemia: acute illness or injury with systemic symptoms that poses a threat to life or bodily functions Malignant neoplasm of colon, unspecified part of colon [facility]): chronic illness or injury with exacerbation, progression, or side effects of treatment that poses a threat to life or bodily functions Nausea: acute illness or injury with systemic symptoms  Amount and/or Complexity of Data Reviewed Independent Historian: EMS    Details: hx External Data Reviewed: notes. Labs: ordered. Decision-making details documented in ED Course. Radiology: ordered and independent interpretation performed. Decision-making details documented in ED Course. ECG/medicine tests: ordered.  Risk Prescription drug management. Decision regarding hospitalization.   Iv ns. Continuous pulse ox and cardiac monitoring. Labs ordered/sent.   Diff dx includes sbo, colon ca/gen weakness, dehydration, etc. - dispo decision including potential need for admission considered - will get labs and imaging, give fluids, and reassess.   Reviewed nursing notes and prior charts for additional history. External reports reviewed. Additional history from: EMS.  Cardiac monitor: sinus rhythm, rate 90.  Labs reviewed/interpreted by me - hgb 12, c/w prior. Ca very low, hx same, even corrected. Ca gluc iv.   Xrays reviewed/interpreted by me - no sbo.   Iv ns  bolus. Reglan iv.   On recheck, pt c/o persistent general weakness, nausea, feeling faint. Sinus rhythm on monitor. On recheck, mid abd tenderness, dull headache, feeling poorly. No chest pain or discomfort.  Will add trop and ct imaging as not clear why pt feeling so poorly.  Family member now here - indicates generally weak in past day, with nausea,  very poor po intake, no specific c/o pain or focal symptoms.   2130, cts pending - signed out to Dr Zenia Resides to check cts, recheck pt and dispo appropriately.  Given gen weakness, dehydration, hypocalcemia - feel likely will require admission.              Final Clinical Impression(s) / ED Diagnoses Final diagnoses:  None    Rx / DC Orders ED Discharge Orders     None         Lajean Saver, MD 11/21/21 2131

## 2021-11-21 NOTE — ED Notes (Signed)
Assisted Kuch RN as secondary nurse during foley insertion

## 2021-11-21 NOTE — Assessment & Plan Note (Signed)
Bilateral hydroureteronephrosis secondary to urinary retention. Had decrease UOP in the past few das. -Foley catheter in place. Follow intake and ouput

## 2021-11-21 NOTE — Discharge Instructions (Addendum)

## 2021-11-21 NOTE — ED Notes (Signed)
Pt keeps stating, "I feel like I'm going to black out," "I blacked out", to this RN. Upon assessment this RN did not see patient lose consciousness, or even fall asleep. This RN notified MD.

## 2021-11-21 NOTE — Assessment & Plan Note (Addendum)
Reported several weeks of right paralumbar spinal pain worse with movement and better at rest. Pain is reproducible with palpation on exam.  Suspect more MSK related.  However given history of cancer, will obtain a dedicated lumbar x-ray although no osseous findings see on CT A/P other than chronic L2 deformity.

## 2021-11-22 ENCOUNTER — Observation Stay (HOSPITAL_COMMUNITY): Payer: Medicare Other

## 2021-11-22 DIAGNOSIS — Z8249 Family history of ischemic heart disease and other diseases of the circulatory system: Secondary | ICD-10-CM | POA: Diagnosis not present

## 2021-11-22 DIAGNOSIS — Z681 Body mass index (BMI) 19 or less, adult: Secondary | ICD-10-CM | POA: Diagnosis not present

## 2021-11-22 DIAGNOSIS — I248 Other forms of acute ischemic heart disease: Secondary | ICD-10-CM | POA: Diagnosis present

## 2021-11-22 DIAGNOSIS — Z20822 Contact with and (suspected) exposure to covid-19: Secondary | ICD-10-CM | POA: Diagnosis present

## 2021-11-22 DIAGNOSIS — D631 Anemia in chronic kidney disease: Secondary | ICD-10-CM | POA: Diagnosis present

## 2021-11-22 DIAGNOSIS — I509 Heart failure, unspecified: Secondary | ICD-10-CM | POA: Diagnosis present

## 2021-11-22 DIAGNOSIS — E43 Unspecified severe protein-calorie malnutrition: Secondary | ICD-10-CM | POA: Diagnosis present

## 2021-11-22 DIAGNOSIS — Z951 Presence of aortocoronary bypass graft: Secondary | ICD-10-CM | POA: Diagnosis not present

## 2021-11-22 DIAGNOSIS — Z955 Presence of coronary angioplasty implant and graft: Secondary | ICD-10-CM | POA: Diagnosis not present

## 2021-11-22 DIAGNOSIS — E785 Hyperlipidemia, unspecified: Secondary | ICD-10-CM | POA: Diagnosis present

## 2021-11-22 DIAGNOSIS — R778 Other specified abnormalities of plasma proteins: Secondary | ICD-10-CM

## 2021-11-22 DIAGNOSIS — G9341 Metabolic encephalopathy: Secondary | ICD-10-CM | POA: Diagnosis present

## 2021-11-22 DIAGNOSIS — I2510 Atherosclerotic heart disease of native coronary artery without angina pectoris: Secondary | ICD-10-CM | POA: Diagnosis present

## 2021-11-22 DIAGNOSIS — I110 Hypertensive heart disease with heart failure: Secondary | ICD-10-CM | POA: Diagnosis not present

## 2021-11-22 DIAGNOSIS — E876 Hypokalemia: Secondary | ICD-10-CM | POA: Diagnosis present

## 2021-11-22 DIAGNOSIS — N179 Acute kidney failure, unspecified: Secondary | ICD-10-CM | POA: Diagnosis present

## 2021-11-22 DIAGNOSIS — E860 Dehydration: Secondary | ICD-10-CM | POA: Diagnosis present

## 2021-11-22 DIAGNOSIS — N132 Hydronephrosis with renal and ureteral calculous obstruction: Secondary | ICD-10-CM | POA: Diagnosis present

## 2021-11-22 DIAGNOSIS — I10 Essential (primary) hypertension: Secondary | ICD-10-CM | POA: Diagnosis not present

## 2021-11-22 DIAGNOSIS — I130 Hypertensive heart and chronic kidney disease with heart failure and stage 1 through stage 4 chronic kidney disease, or unspecified chronic kidney disease: Secondary | ICD-10-CM | POA: Diagnosis present

## 2021-11-22 DIAGNOSIS — F05 Delirium due to known physiological condition: Secondary | ICD-10-CM | POA: Diagnosis not present

## 2021-11-22 DIAGNOSIS — Z923 Personal history of irradiation: Secondary | ICD-10-CM | POA: Diagnosis not present

## 2021-11-22 DIAGNOSIS — Z841 Family history of disorders of kidney and ureter: Secondary | ICD-10-CM | POA: Diagnosis not present

## 2021-11-22 DIAGNOSIS — D6959 Other secondary thrombocytopenia: Secondary | ICD-10-CM | POA: Diagnosis not present

## 2021-11-22 DIAGNOSIS — C189 Malignant neoplasm of colon, unspecified: Secondary | ICD-10-CM | POA: Diagnosis not present

## 2021-11-22 DIAGNOSIS — C7801 Secondary malignant neoplasm of right lung: Secondary | ICD-10-CM | POA: Diagnosis present

## 2021-11-22 DIAGNOSIS — R911 Solitary pulmonary nodule: Secondary | ICD-10-CM | POA: Diagnosis not present

## 2021-11-22 DIAGNOSIS — C19 Malignant neoplasm of rectosigmoid junction: Secondary | ICD-10-CM | POA: Diagnosis present

## 2021-11-22 LAB — CBC
HCT: 34.3 % — ABNORMAL LOW (ref 39.0–52.0)
Hemoglobin: 11.7 g/dL — ABNORMAL LOW (ref 13.0–17.0)
MCH: 31.7 pg (ref 26.0–34.0)
MCHC: 34.1 g/dL (ref 30.0–36.0)
MCV: 93 fL (ref 80.0–100.0)
Platelets: 141 10*3/uL — ABNORMAL LOW (ref 150–400)
RBC: 3.69 MIL/uL — ABNORMAL LOW (ref 4.22–5.81)
RDW: 13.1 % (ref 11.5–15.5)
WBC: 14.1 10*3/uL — ABNORMAL HIGH (ref 4.0–10.5)
nRBC: 0 % (ref 0.0–0.2)

## 2021-11-22 LAB — BASIC METABOLIC PANEL
Anion gap: 11 (ref 5–15)
BUN: 18 mg/dL (ref 8–23)
CO2: 21 mmol/L — ABNORMAL LOW (ref 22–32)
Calcium: 6.7 mg/dL — ABNORMAL LOW (ref 8.9–10.3)
Chloride: 105 mmol/L (ref 98–111)
Creatinine, Ser: 1.09 mg/dL (ref 0.61–1.24)
GFR, Estimated: >60 mL/min (ref >60)
Glucose, Bld: 142 mg/dL — ABNORMAL HIGH (ref 70–99)
Potassium: 3.2 mmol/L — ABNORMAL LOW (ref 3.5–5.1)
Sodium: 137 mmol/L (ref 135–145)

## 2021-11-22 LAB — ECHOCARDIOGRAM COMPLETE
AR max vel: 2.74 cm2
AV Area VTI: 3.24 cm2
AV Area mean vel: 2.34 cm2
AV Mean grad: 5 mmHg
AV Peak grad: 6.8 mmHg
Ao pk vel: 1.3 m/s
Area-P 1/2: 4.21 cm2
Height: 68 in
MV M vel: 5.58 m/s
MV Peak grad: 124.5 mmHg
S' Lateral: 3.1 cm
Weight: 1971.79 oz

## 2021-11-22 LAB — MAGNESIUM: Magnesium: 0.9 mg/dL — CL (ref 1.7–2.4)

## 2021-11-22 LAB — TROPONIN I (HIGH SENSITIVITY): Troponin I (High Sensitivity): 392 ng/L (ref <18)

## 2021-11-22 MED ORDER — AMLODIPINE BESYLATE 2.5 MG PO TABS
2.5000 mg | ORAL_TABLET | Freq: Two times a day (BID) | ORAL | Status: DC
  Administered 2021-11-22 (×2): 2.5 mg via ORAL
  Filled 2021-11-22 (×2): qty 1

## 2021-11-22 MED ORDER — PANTOPRAZOLE SODIUM 40 MG PO TBEC
40.0000 mg | DELAYED_RELEASE_TABLET | Freq: Every day | ORAL | Status: DC
  Administered 2021-11-22 – 2021-12-01 (×9): 40 mg via ORAL
  Filled 2021-11-22 (×10): qty 1

## 2021-11-22 MED ORDER — ROSUVASTATIN CALCIUM 20 MG PO TABS
20.0000 mg | ORAL_TABLET | Freq: Every day | ORAL | Status: DC
  Administered 2021-11-22 – 2021-12-01 (×9): 20 mg via ORAL
  Filled 2021-11-22 (×10): qty 1

## 2021-11-22 MED ORDER — CHLORHEXIDINE GLUCONATE CLOTH 2 % EX PADS
6.0000 | MEDICATED_PAD | Freq: Every day | CUTANEOUS | Status: DC
  Administered 2021-11-22 – 2021-12-01 (×10): 6 via TOPICAL

## 2021-11-22 MED ORDER — SODIUM CHLORIDE 0.9% FLUSH
10.0000 mL | Freq: Two times a day (BID) | INTRAVENOUS | Status: DC
  Administered 2021-11-22 – 2021-11-25 (×5): 10 mL
  Administered 2021-11-25: 20 mL
  Administered 2021-11-26 – 2021-12-01 (×5): 10 mL

## 2021-11-22 MED ORDER — ASPIRIN 81 MG PO TBEC
81.0000 mg | DELAYED_RELEASE_TABLET | Freq: Every day | ORAL | Status: DC
  Administered 2021-11-22 – 2021-11-24 (×3): 81 mg via ORAL
  Filled 2021-11-22 (×3): qty 1

## 2021-11-22 MED ORDER — POTASSIUM CHLORIDE CRYS ER 20 MEQ PO TBCR
40.0000 meq | EXTENDED_RELEASE_TABLET | Freq: Every day | ORAL | Status: DC
  Administered 2021-11-22 – 2021-11-25 (×3): 40 meq via ORAL
  Filled 2021-11-22 (×4): qty 2

## 2021-11-22 MED ORDER — ORAL CARE MOUTH RINSE
15.0000 mL | OROMUCOSAL | Status: DC | PRN
Start: 2021-11-22 — End: 2021-12-01

## 2021-11-22 MED ORDER — SODIUM CHLORIDE 0.9% FLUSH: 10.0000 mL | INTRAVENOUS | Status: DC | PRN

## 2021-11-22 MED ORDER — MAGNESIUM SULFATE 4 GM/100ML IV SOLN
4.0000 g | Freq: Once | INTRAVENOUS | Status: AC
  Administered 2021-11-22: 4 g via INTRAVENOUS
  Filled 2021-11-22 (×2): qty 100

## 2021-11-22 MED ORDER — CHOLESTYRAMINE 4 G PO PACK
4.0000 g | PACK | Freq: Two times a day (BID) | ORAL | Status: DC
  Administered 2021-11-22 – 2021-12-01 (×17): 4 g via ORAL
  Filled 2021-11-22 (×20): qty 1

## 2021-11-22 MED ORDER — ATENOLOL 50 MG PO TABS
50.0000 mg | ORAL_TABLET | Freq: Every day | ORAL | Status: DC
  Administered 2021-11-22: 50 mg via ORAL
  Filled 2021-11-22 (×2): qty 1

## 2021-11-22 NOTE — ED Notes (Signed)
ED TO INPATIENT HANDOFF REPORT  ED Nurse Name and Phone #: [phone] [name] Name/Age/Gender Jared White 76 y.o. male Room/Bed: 026C/026C  Code Status   Code Status: Full Code  Home/SNF/Other Home Patient oriented to: self, place, time, and situation Is this baseline? Yes   Triage Complete: Triage complete  Chief Complaint Hypocalcemia [E83.51]  Triage Note Pt BIB EMS after having constant nausea and dry heaving for the past two days. Pt does have a hx of colon cancer, but is currently in remission. Pt complains of feeling fatigued and having general malaise as well as not eating. Patient has also had intermittent diarrhea and constipation as well.   VSS w/ EMS.   EMS gave '4mg'$  zofran on route.    Allergies No Known Allergies  Level of Care/Admitting Diagnosis ED Disposition     ED Disposition  Admit   Condition  --   [place]: [facility] [[id number]]  Level of Care: Progressive [102]  Admit to Progressive based on following criteria: RESPIRATORY PROBLEMS hypoxemic/hypercapnic respiratory failure that is responsive to NIPPV (BiPAP) or High Flow Nasal Cannula (6-80 lpm). Frequent assessment/intervention, no > Q2 hrs < Q4 hrs, to maintain oxygenation and pulmonary hygiene.  May place patient in observation at [facility] or [place] if equivalent level of care is available:: No  Covid Evaluation: Asymptomatic - no recent exposure (last 10 days) testing not required  Diagnosis: Hypocalcemia [275.41.ICD-9-CM]  Admitting Physician: Orene Desanctis [[id number]]  Attending Physician: Orene Desanctis [[id number]]          B Medical/Surgery History Past Medical History:  Diagnosis Date   Arthritis    Blood transfusion without reported diagnosis    Broken back    CAD (coronary artery disease)    PCI & CABG   C[facility]act    "LIGHT"-RIGHT   CHF (congestive heart failure) ([place])    Diverticulitis    mild - approx 2004   Dysrhythmia    occasional  arrythmias   Family history of heart disease    GERD (gastroesophageal reflux disease)    Heart murmur    HX OF YEARS AGO    History of kidney stones    History of nuclear stress test 06/30/2011   lexiscan; normal pattern of perfusion post-stress; low risk scan    Hyperlipidemia    Hypertension    IgA nephropathy    Irregular heart beat    Myocardial infarction ([place])    Pre-diabetes    Rectal cancer ([place]) 08/19/2019   Systemic hypertension    Past Surgical History:  Procedure Laterality Date   CARDIAC CATHETERIZATION  08/22/1995   PTCA of OM (Dr. Marella Chimes)   COLONOSCOPY     CORONARY ARTERY BYPASS GRAFT  01/22/2000   x5 - LIMA to LAD, SVG to diagonal, sequential SVG to ramus & OM, SVG to PDA (Dr. Tharon Aquas Trigt)   DIVERTING ILEOSTOMY N/A 06/10/2020   Procedure: DIVERTING LOOP ILEOSTOMY;  Surgeon: Leighton Ruff, MD;  Location: [facility];  Service: General;  Laterality: N/A;   ILEOSTOMY CLOSURE N/A 10/09/2020   Procedure: LOOP ILEOSTOMY REVERSAL;  Surgeon: Leighton Ruff, MD;  Location: [facility];  Service: General;  Laterality: N/A;   IR IMAGING GUIDED PORT INSERTION  09/04/2019   LUNG LOBECTOMY     left upper lobe   RECTAL BIOPSY N/A 07/09/2021   Procedure: BIOPSY RECTAL;  Surgeon: Leighton Ruff, MD;  Location: [facility];  Service: General;  Laterality: N/A;  RECTAL EXAM UNDER ANESTHESIA N/A 07/09/2021   Procedure: ANAL RECTAL EXAM UNDER ANESTHESIA, RIGID PROCTOSCOPY;  Surgeon: Leighton Ruff, MD;  Location: [facility];  Service: General;  Laterality: N/A;   TRANSTHORACIC ECHOCARDIOGRAM  12/24/2012   EF 55-60%, mild LVH, mild conc hypertrophy; mild MR; LA mildly dilated   XI ROBOTIC ASSISTED LOWER ANTERIOR RESECTION N/A 06/10/2020   Procedure: XI ROBOTIC ASSISTED LOWER ANTERIOR RESECTION, MOBILIZATION OF SPLENIC FLEXURE, RIGID PROCTOSCOPY;  Surgeon: Leighton Ruff, MD;  Location: [facility];  Service: General;  Laterality: N/A;     A IV Location/Drains/Wounds Patient  Lines/Drains/Airways Status     Active Line/Drains/Airways     Name Placement date Placement time Site Days   Implanted Port 09/04/19 Right Chest 09/04/19  1211  Chest  810   Implanted Port Right Chest --  --  Chest  --   Peripheral IV 11/21/21 20 G Anterior;Left;Proximal Forearm 11/21/21  1613  Forearm  1   Urethral Catheter Kuch RN Non-latex 16 Fr. 11/21/21  2228  Non-latex  1   Incision (Closed) 10/09/20 Abdomen Right 10/09/20  0908  -- 409            Intake/Output Last 24 hours  Intake/Output Summary (Last 24 hours) at 11/22/2021 0443 Last data filed at 11/22/2021 0150 Gross per 24 hour  Intake 2044.39 ml  Output --  Net 2044.39 ml    Labs/Imaging Results for orders placed or performed during the hospital encounter of 11/21/21 (from the past 48 hour(s))  CBC     Status: Abnormal   Collection Time: 11/21/21  5:19 PM  Result Value Ref Range   WBC 10.7 (H) 4.0 - 10.5 K/uL   RBC 3.82 (L) 4.22 - 5.81 MIL/uL   Hemoglobin 12.1 (L) 13.0 - 17.0 g/dL   HCT 36.6 (L) 39.0 - 52.0 %   MCV 95.8 80.0 - 100.0 fL   MCH 31.7 26.0 - 34.0 pg   MCHC 33.1 30.0 - 36.0 g/dL   RDW 13.3 11.5 - 15.5 %   Platelets 165 150 - 400 K/uL   nRBC 0.0 0.0 - 0.2 %    Comment: Performed at [facility], [address] [place], [place] [address]  Comprehensive metabolic panel     Status: Abnormal   Collection Time: 11/21/21  5:19 PM  Result Value Ref Range   Sodium 143 135 - 145 mmol/L   Potassium 3.9 3.5 - 5.1 mmol/L   Chloride 107 98 - 111 mmol/L   CO2 23 22 - 32 mmol/L   Glucose, Bld 143 (H) 70 - 99 mg/dL    Comment: Glucose reference range applies only to samples taken after fasting for at least 8 hours.   BUN 24 (H) 8 - 23 mg/dL   Creatinine, Ser 1.32 (H) 0.61 - 1.24 mg/dL   Calcium 6.7 (L) 8.9 - 10.3 mg/dL   Total Protein 6.1 (L) 6.5 - 8.1 g/dL   Albumin 3.3 (L) 3.5 - 5.0 g/dL   AST 24 15 - 41 U/L   ALT 23 0 - 44 U/L   Alkaline Phosphatase 67 38 - 126 U/L   Total Bilirubin 1.1 0.3 -  1.2 mg/dL   GFR, Estimated 56 (L) >60 mL/min    Comment: (NOTE) Calculated using the CKD-EPI Creatinine Equation (2021)    Anion gap 13 5 - 15    Comment: Performed at [facility], [place] [address], [place], [place] [address]  Urinalysis, Routine w reflex microscopic Urine, Clean Catch  Status: Abnormal   Collection Time: 11/21/21  9:04 PM  Result Value Ref Range   Color, Urine YELLOW YELLOW   APPearance CLEAR CLEAR   Specific Gravity, Urine 1.014 1.005 - 1.030   pH 5.0 5.0 - 8.0   Glucose, UA NEGATIVE NEGATIVE mg/dL   Hgb urine dipstick SMALL (A) NEGATIVE   Bilirubin Urine NEGATIVE NEGATIVE   Ketones, ur 20 (A) NEGATIVE mg/dL   Protein, ur 100 (A) NEGATIVE mg/dL   Nitrite NEGATIVE NEGATIVE   Leukocytes,Ua NEGATIVE NEGATIVE   RBC / HPF 0-5 0 - 5 RBC/hpf   WBC, UA 0-5 0 - 5 WBC/hpf   Bacteria, UA NONE SEEN NONE SEEN   Mucus PRESENT    Hyaline Casts, UA PRESENT     Comment: Performed at [facility] [address] [place], [place] [address]  Troponin I (High Sensitivity)     Status: Abnormal   Collection Time: 11/21/21 10:41 PM  Result Value Ref Range   Troponin I (High Sensitivity) 108 (HH) <18 ng/L    Comment: CRITICAL RESULT CALLED TO, READ BACK BY AND VERIFIED WITH: Pershing Cox K,RN 11/21/21 2326 WAYK (NOTE) Elevated high sensitivity troponin I (hsTnI) values and significant  changes across serial measurements may suggest ACS but many other  chronic and acute conditions are known to elevate hsTnI results.  Refer to the Links section for chest pain algorithms and additional  guidance. Performed at [facility], [place] [address], [place], [place] [address]   Magnesium     Status: Abnormal   Collection Time: 11/21/21 10:44 PM  Result Value Ref Range   Magnesium <0.5 (LL) 1.7 - 2.4 mg/dL    Comment: REPEATED TO VERIFY CRITICAL RESULT CALLED TO, READ BACK BY AND VERIFIED WITH: Nolon Nations 11/21/21 2325 WAYK Performed at [facility] [address], [place], [place] [address]   Troponin I (High Sensitivity)     Status: Abnormal   Collection Time: 11/22/21  3:25 AM  Result Value Ref Range   Troponin I (High Sensitivity) 392 (HH) <18 ng/L    Comment: CRITICAL VALUE NOTED.  VALUE IS CONSISTENT WITH PREVIOUSLY REPORTED AND CALLED VALUE. (NOTE) Elevated high sensitivity troponin I (hsTnI) values and significant  changes across serial measurements may suggest ACS but many other  chronic and acute conditions are known to elevate hsTnI results.  Refer to the Links section for chest pain algorithms and additional  guidance. Performed at [facility], [place] [address] [place], [place] [address]   CBC     Status: Abnormal   Collection Time: 11/22/21  4:02 AM  Result Value Ref Range   WBC 14.1 (H) 4.0 - 10.5 K/uL   RBC 3.69 (L) 4.22 - 5.81 MIL/uL   Hemoglobin 11.7 (L) 13.0 - 17.0 g/dL   HCT 34.3 (L) 39.0 - 52.0 %   MCV 93.0 80.0 - 100.0 fL   MCH 31.7 26.0 - 34.0 pg   MCHC 34.1 30.0 - 36.0 g/dL   RDW 13.1 11.5 - 15.5 %   Platelets 141 (L) 150 - 400 K/uL   nRBC 0.0 0.0 - 0.2 %    Comment: Performed at [facility], [place] [address], [place], [place] [address]   DG Lumbar Spine 1 View  Result Date: 11/22/2021 CLINICAL DATA:  Back pain. EXAM: LUMBAR SPINE - 1 VIEW COMPARISON:  None Available. FINDINGS: There is no evidence of an acute lumbar spine fracture. A chronic compression fracture deformity is  seen at the level of L2. Alignment is normal. Moderate to marked severity multilevel endplate sclerosis and anterior osteophyte formation are seen throughout all levels of the lumbar spine. Mild multilevel intervertebral disc space narrowing is also seen. IMPRESSION: 1. Chronic compression fracture deformity at the level of L2. 2. Moderate to marked severity multilevel degenerative disc disease. Electronically Signed   By: Virgina Norfolk M.D.   On: 11/22/2021 00:32   CT Abdomen Pelvis W Contrast  Result Date: 11/21/2021 CLINICAL DATA:   Acute nonlocalized abdominal pain. History of colon cancer. Nausea. Weakness. EXAM: CT ABDOMEN AND PELVIS WITH CONTRAST TECHNIQUE: Multidetector CT imaging of the abdomen and pelvis was performed using the standard protocol following bolus administration of intravenous contrast. RADIATION DOSE REDUCTION: This exam was performed according to the departmental dose-optimization program which includes automated exposure control, adjustment of the mA and/or kV according to patient size and/or use of iterative reconstruction technique. CONTRAST:  90m OMNIPAQUE IOHEXOL 300 MG/ML  SOLN COMPARISON:  Radiograph earlier today.  Most recent CT 06/11/2021 FINDINGS: Lower chest: No acute findings. Normal heart size with coronary artery calcifications. Hepatobiliary: No focal hepatic lesion. Gallstones within the gallbladder. No abnormal gallbladder distention or inflammation. No biliary dilatation. Pancreas: Grossly negative, although suboptimally assessed due to motion, lack of enteric contrast and paucity of intra-abdominal fat. Spleen: Normal in size without focal abnormality. Adrenals/Urinary Tract: No adrenal nodule. Symmetric bilateral hydroureteronephrosis. Ureters are dilated to the bladder insertion. No ureteral stone or external cause for obstruction. Moderately distended urinary bladder with bladder volume = 470 cm^3. No bladder wall thickening. Small bilateral renal cysts, needing no dedicated follow-up. Punctate bilateral intrarenal calculi. No bladder stones. No urethral stone visualized. Stomach/Bowel: Diffuse and fairly prominent gastric wall thickening, a chronic finding. Small hiatal hernia with wall thickening of the distal esophagus. There is a duodenal diverticulum. No small bowel obstruction or abnormal distention. Enteric sutures noted in the right lower quadrant. Enteric sutures noted in the rectosigmoid colon. Minimal colonic stool. Colonic diverticulosis without focal diverticulitis. No acute colonic  inflammation. Normal appendix tentatively visualized, right. Regardless, no appendicitis. Similar presacral soft tissue thickening, 18 mm. Vascular/Lymphatic: Aortic and branch atherosclerosis. No aortic aneurysm. Moderate atherosclerosis of mesenteric vasculature, branches appear patent. Patent portal vein. No abdominopelvic adenopathy. Reproductive: Mildly enlarged prostate. Other: Stable presacral soft tissue density. No ascites or free air. Musculoskeletal: Chronic L2 compression deformity, unchanged. No acute osseous abnormalities or focal bone lesion. IMPRESSION: 1. Symmetric bilateral hydroureteronephrosis, ureters both dilated to the bladder insertion. No ureteral stone or external cause for obstruction. Moderately distended urinary bladder with bladder volume = 470 cm. Recommend correlation with urinalysis to exclude urinary tract infection. 2. Punctate bilateral intrarenal calculi. 3. Chronic diffuse gastric wall thickening, unchanged from prior exams. Small hiatal hernia with wall thickening of the distal esophagus, can be seen with reflux or esophagitis. 4. Colonic diverticulosis without focal diverticulitis. 5. Cholelithiasis without gallbladder inflammation. 6. Additional stable chronic findings as described. Aortic Atherosclerosis (ICD10-I70.0). Electronically Signed   By: MKeith RakeM.D.   On: 11/21/2021 21:59   CT Head Wo Contrast  Result Date: 11/21/2021 CLINICAL DATA:  Worsening headaches EXAM: CT HEAD WITHOUT CONTRAST TECHNIQUE: Contiguous axial images were obtained from the base of the skull through the vertex without intravenous contrast. RADIATION DOSE REDUCTION: This exam was performed according to the departmental dose-optimization program which includes automated exposure control, adjustment of the mA and/or kV according to patient size and/or use of iterative reconstruction technique. COMPARISON:  12/14/2012 FINDINGS: Brain:  No evidence of acute infarction, hemorrhage,  hydrocephalus, extra-axial collection or mass lesion/mass effect. Mild atrophic and chronic white matter ischemic changes are noted. Vascular: No hyperdense vessel or unexpected calcification. Skull: Normal. Negative for fracture or focal lesion. Sinuses/Orbits: No acute finding. Other: None. IMPRESSION: Chronic atrophic and ischemic changes without acute abnormality. Electronically Signed   By: Inez Catalina M.D.   On: 11/21/2021 21:51   DG ABD ACUTE 2+V W 1V CHEST  Result Date: 11/21/2021 CLINICAL DATA:  Nausea and vomiting.  Fatigue. EXAM: DG ABDOMEN ACUTE WITH 1 VIEW CHEST COMPARISON:  Chest and abdominopelvic CT 06/11/2021 FINDINGS: Right chest port in place with tip in the SVC. Post median sternotomy and CABG. Stable upper normal heart size, unchanged mediastinal contours. No acute airspace disease. No pneumothorax or pleural effusion. No free intra-abdominal air. No bowel dilatation to suggest obstruction. Enteric sutures are noted in the right abdomen. Known punctate intrarenal calculi on CT are not definitively defined by radiograph. There are vascular calcifications. The bones are diffusely under mineralized. No acute osseous abnormalities are seen. IMPRESSION: 1. No acute chest findings. 2. Normal bowel gas pattern. No free air. Electronically Signed   By: Keith Rake M.D.   On: 11/21/2021 19:02    Pending Labs Unresulted Labs (From admission, onward)     Start     Ordered   11/22/21 0530  Magnesium  Once-Timed,   TIMED        11/21/21 2353   11/22/21 4765  Basic metabolic panel  Tomorrow morning,   R        11/21/21 2348            Vitals/Pain Today's Vitals   11/21/21 2300 11/21/21 2330 11/22/21 0000 11/22/21 0345  BP: (!) 166/92 (!) 166/74 (!) 154/91 (!) 167/97  Pulse: 67 80 (!) 44 67  Resp: (!) 30 (!) 39 (!) 34 16  Temp:      TempSrc:      SpO2: 98% 97% 98% 98%  Weight:      Height:      PainSc:        Isolation Precautions No active  isolations  Medications Medications  enoxaparin (LOVENOX) injection 40 mg (has no administration in time range)  feeding supplement (ENSURE ENLIVE / ENSURE PLUS) liquid 237 mL (has no administration in time range)  lactated ringers infusion ( Intravenous New Bag/Given 11/22/21 0046)  calcium carbonate (TUMS - dosed in mg elemental calcium) chewable tablet 400 mg of elemental calcium (has no administration in time range)  ondansetron (ZOFRAN) injection 4 mg (has no administration in time range)  sodium chloride 0.9 % bolus 1,000 mL (0 mLs Intravenous Stopped 11/21/21 1837)  sodium chloride 0.9 % bolus 1,000 mL (0 mLs Intravenous Stopped 11/21/21 2326)  metoCLOPramide (REGLAN) injection 10 mg (10 mg Intravenous Given 11/21/21 1821)  calcium gluconate inj 10% (1 g) URGENT USE ONLY! (1 g Intravenous Given 11/21/21 1952)  iohexol (OMNIPAQUE) 300 MG/ML solution 80 mL (80 mLs Intravenous Contrast Given 11/21/21 2124)  magnesium sulfate IVPB 2 g 50 mL (0 g Intravenous Stopped 11/22/21 0150)    Mobility walks with device High fall risk   Focused Assessments    R Recommendations: See Admitting Provider Note  Report given to:   Additional Notes:

## 2021-11-22 NOTE — Progress Notes (Signed)
PROGRESS NOTE  Jared White  DOB: 1946/04/13  PCP: Caprice Red, MD DUK:[id number]  DOA: 11/21/2021  LOS: 0 days  Hospital Day: 2  Brief narrative: Jared White is a 76 y.o. male with PMH significant for HTN, HLD, CAD/stent/CABG, CHF, GERD, IgA nephropathy, CKD, rectal cancer status post radiation, neoadjuvant treatment, diverting ileostomy s/p reversal with recent anastomotic recurrence, chronic diarrhea, hypocalcemia.  6/21, he had an outpatient elective procedure to get permanent colostomy because of recurrence of cancer but the procedure had to be canceled because of significantly low calcium level.  He was not hospitalized at that time and continued with calcium supplements at home. Patient presented to the ED on 7/2 with complaint of constant nausea, driving for the past 2 days leading to poor oral intake, increased weakness, impaired gait, decreased urine output. He has chronic diarrhea due to colon cancer, has low appetite and been only able to tolerate liquid diet.  He is on chronic calcium and magnesium supplementation.  For the past 2 to 3 weeks, patient has also noted right paralumbar pain worse with movement and better at rest.   In the ED, patient was afebrile, heart rate 99, blood pressure 135/95, breathing on room air. Labs with WBC normal at 7, hemoglobin 11.4, BUN/creatinine elevated 28/1.65, serum calcium level low at 6.2, albumin 3.3. UA negative.   Abdomen x-ray with normal bowel gas pattern CT head negative. Lumbar spine x-ray showed chronic compression fracture deformity at the level of L2. Moderate to marked severity multilevel degenerative disc disease. CT abdomen pelvis showed symmetric bilateral hydroureteronephrosis, ureters both dilated to the bladder insertion. No ureteral stone or external cause for obstruction. Moderately distended urinary bladder with bladder volume = 470 cm.  Patient was kept in observation to hospitalist service.  Subjective: Patient was  seen and examined this morning.  Pleasant elderly Caucasian male.  Lying on bed.  Not in distress.  Brother at bedside.  Patient states that he has chronic incontinence of loose stool since the surgery.  He takes Imodium as needed and starts getting constipated for few days. Chart reviewed.  Afebrile since admission.  Heart rate has been mostly 100, has dipped down to 40s in the night, blood pressure mostly in the 150s and 160s. Labs this morning with potassium low at 3.2, WBC up to 14.1 this morning, calcium slightly up to 6.7 Troponin overnight trended up from 10 8-3 92  Assessment and plan: Acute on chronic hypocalcemia and hypomagnesemia -Patient has chronically low calcium and magnesium level due to chronic diarrhea and is on regular calcium and magnesium supplementation at home. -Patient with generalized weakness, poor appetite. -Levels as below showing significant deficit. -Oral and IV replacements given. -Repeat labs tomorrow. Recent Labs  Lab 11/21/21 1719 11/21/21 2244 11/22/21 0402  CALCIUM 6.7*  --  6.7*  MG  --  <0.5* 0.9*   AKI on CKD2 History of IgA nephropathy -Recent creatinine less than 1 in February 2023 suggestive of CKD 2 -Creatinine elevated at 1.32 at presentation.  Improving with IV fluid. Recent Labs    12/01/20 1025 07/07/21 0855 09/06/21 1103 11/01/21 1045 11/21/21 1719 11/22/21 0402  BUN 28* 19 25* 28* 24* 18  CREATININE 1.50* 0.93 1.17 1.65* 1.32* 1.09   Recurrence of rectal cancer  -3/21 rectal cancer diagnosed after seen colonoscopy.  Completed total neoadjuvant treatment with Dr. Tami Lin.  Finished radiation in 11/21.  Underwent APR on January 2022 with a diverting ileostomy.  May 2022, underwent ileostomy reversal.  Follow-up endoscopy recently showed an anastomotic polyp biopsy of which showed adenocarcinoma. -General surgery planned for permanent colostomy but the surgery had to be canceled twice.  First time was because of poor bowel prep and  second time was because of hypocalcemia.   -We will check with Dr. Marcello Moores if getting the surgery while he is inpatient is a better option.  Chronic diarrhea/constipation -Patient states he leaks his stool most of the time.  Takes Imodium as needed but tends to get constipated with that.   -Start the patient on Questran this morning.    Elevated troponin -No symptoms of chest pain or shortness of breath.   -Troponin elevated with trend as below.  Suspect demand ischemia in the setting of dehydration with underlying history of CABG -Obtain echocardiogram to look for wall motion abnormalities Recent Labs    11/21/21 2241 11/22/21 0325  TROPONINIHS 108* 392*   HTN -PTA on atenolol, amlodipine, diovan.. -Resume atenolol and amlodipine.  Keep Diovan on hold.  Chronic L2 compression fracture -Reported several weeks of right paralumbar spinal pain worse with movement and better at rest. Pain is reproducible with palpation on exam. -Lumbar spine x-ray showed chronic compression fracture deformity at the level of L2. Moderate to marked severity multilevel degenerative disc disease.  Bilateral hydroureteronephrosis -CT abdomen noted bilateral hydroureteronephrosis secondary to urinary retention likely at the level of bladder neck.   -Patient denies any history of BPH.  CT abdomen showed mildly enlarged prostate. -Although there is evidence of punctate bilateral intrarenal calculi, no evidence of obstructive urinary stone or mass.   -More than 450 mL of urine retention was noted in CT scan.  Foley catheter was placed -Plan voiding trial prior to discharge   History of GERD -CT abdomen showed chronic diffuse gastric wall thickening, small hiatal hernia with wall thickening of the distal esophagus, can be seen with reflux or esophagitis. -Continue PPI  Colonic diverticulosis without focal diverticulitis -Has alternating diarrhea and constipation apparently  Cholelithiasis without gallbladder  inflammation -No neurosurgical intervention at this time.  Goals of care   Code Status: Full Code    Mobility: Encourage ambulation.  PT eval ordered  Skin assessment:     Nutritional status:  Body mass index is 18.74 kg/m.          Diet:  Diet Order             Diet regular Room service appropriate? Yes; Fluid consistency: Thin  Diet effective now                   DVT prophylaxis:  enoxaparin (LOVENOX) injection 40 mg Start: 11/22/21 1000   Antimicrobials: None Fluid: Currently on LR at 52 mill per hour Consultants: None yet Family Communication: Brother at bedside  Status is: Observation  Continue in-hospital care because: Needs IV fluid clinically replacement, PT eval.  May need surgical intervention too Level of care: Telemetry Medical   Dispo: The patient is from: Home              Anticipated d/c is to: Pending clinical course              Patient currently is not medically stable to d/c.   Difficult to place patient No     Infusions:   lactated ringers 75 mL/hr at 11/22/21 0800   magnesium sulfate bolus IVPB 4 g (11/22/21 1005)    Scheduled Meds:  amLODipine  2.5 mg Oral BID   aspirin EC  81  mg Oral Daily   atenolol  50 mg Oral Daily   calcium carbonate  400 mg of elemental calcium Oral Daily   Chlorhexidine Gluconate Cloth  6 each Topical Q0600   cholestyramine  4 g Oral BID   enoxaparin (LOVENOX) injection  40 mg Subcutaneous Daily   feeding supplement  237 mL Oral BID BM   pantoprazole  40 mg Oral Daily   potassium chloride  40 mEq Oral Daily   rosuvastatin  20 mg Oral Daily   sodium chloride flush  10-40 mL Intracatheter Q12H    PRN meds: ondansetron (ZOFRAN) IV, sodium chloride flush   Antimicrobials: Anti-infectives (From admission, onward)    None       Objective: Vitals:   11/22/21 0800 11/22/21 1154  BP: (!) 155/91 (!) 138/110  Pulse: (!) 107 94  Resp: 18   Temp:    SpO2: 97%     Intake/Output Summary  (Last 24 hours) at 11/22/2021 1158 Last data filed at 11/22/2021 0800 Gross per 24 hour  Intake 2119.39 ml  Output 1100 ml  Net 1019.39 ml   Filed Weights   11/21/21 1616 11/22/21 0553  Weight: 58.1 kg 55.9 kg   Weight change:  Body mass index is 18.74 kg/m.   Physical Exam: General exam: Pleasant, elderly Caucasian male.  Not in physical distress Skin: No rashes, lesions or ulcers. HEENT: Atraumatic, normocephalic, no obvious bleeding Lungs: Clear to auscultate bilaterally CVS: Regular rate and rhythm, systolic ejection murmur GI/Abd soft, nontender, nondistended, bowel sound present CNS: Alert, awake, oriented x3 Psychiatry: Mood appropriate Extremities: No pedal edema, no calf tenderness  Data Review: I have personally reviewed the laboratory data and studies available.  F/u labs ordered Unresulted Labs (From admission, onward)     Start     Ordered   11/23/21 0500  Magnesium  Tomorrow morning,   R        11/22/21 0810   11/23/21 0500  Phosphorus  Tomorrow morning,   R        11/22/21 0810   11/23/21 0500  CBC with Differential/Platelet  Daily,   R      11/22/21 0820   11/23/21 0500  Comprehensive metabolic panel  Daily,   R      11/22/21 0820            Signed, Terrilee Croak, MD [facility] 11/22/2021

## 2021-11-22 NOTE — Progress Notes (Signed)
  Echocardiogram 2D Echocardiogram has been performed.  Joette Catching 11/22/2021, 2:40 PM

## 2021-11-23 ENCOUNTER — Other Ambulatory Visit: Payer: Self-pay

## 2021-11-23 ENCOUNTER — Inpatient Hospital Stay (HOSPITAL_COMMUNITY): Payer: Medicare Other

## 2021-11-23 DIAGNOSIS — E43 Unspecified severe protein-calorie malnutrition: Secondary | ICD-10-CM | POA: Insufficient documentation

## 2021-11-23 LAB — CBC WITH DIFFERENTIAL/PLATELET
Abs Immature Granulocytes: 0.09 10*3/uL — ABNORMAL HIGH (ref 0.00–0.07)
Basophils Absolute: 0 10*3/uL (ref 0.0–0.1)
Basophils Relative: 0 %
Eosinophils Absolute: 0 10*3/uL (ref 0.0–0.5)
Eosinophils Relative: 0 %
HCT: 31.3 % — ABNORMAL LOW (ref 39.0–52.0)
Hemoglobin: 10.9 g/dL — ABNORMAL LOW (ref 13.0–17.0)
Immature Granulocytes: 1 %
Lymphocytes Relative: 12 %
Lymphs Abs: 1.1 10*3/uL (ref 0.7–4.0)
MCH: 32.5 pg (ref 26.0–34.0)
MCHC: 34.8 g/dL (ref 30.0–36.0)
MCV: 93.4 fL (ref 80.0–100.0)
Monocytes Absolute: 0.7 10*3/uL (ref 0.1–1.0)
Monocytes Relative: 7 %
Neutro Abs: 7.6 10*3/uL (ref 1.7–7.7)
Neutrophils Relative %: 80 %
Platelets: 106 10*3/uL — ABNORMAL LOW (ref 150–400)
RBC: 3.35 MIL/uL — ABNORMAL LOW (ref 4.22–5.81)
RDW: 13.2 % (ref 11.5–15.5)
WBC: 9.6 10*3/uL (ref 4.0–10.5)
nRBC: 0 % (ref 0.0–0.2)

## 2021-11-23 LAB — COMPREHENSIVE METABOLIC PANEL
ALT: 17 U/L (ref 0–44)
AST: 31 U/L (ref 15–41)
Albumin: 2.9 g/dL — ABNORMAL LOW (ref 3.5–5.0)
Alkaline Phosphatase: 58 U/L (ref 38–126)
Anion gap: 9 (ref 5–15)
BUN: 20 mg/dL (ref 8–23)
CO2: 20 mmol/L — ABNORMAL LOW (ref 22–32)
Calcium: 7 mg/dL — ABNORMAL LOW (ref 8.9–10.3)
Chloride: 110 mmol/L (ref 98–111)
Creatinine, Ser: 0.98 mg/dL (ref 0.61–1.24)
GFR, Estimated: >60 mL/min (ref >60)
Glucose, Bld: 93 mg/dL (ref 70–99)
Potassium: 3.5 mmol/L (ref 3.5–5.1)
Sodium: 139 mmol/L (ref 135–145)
Total Bilirubin: 0.8 mg/dL (ref 0.3–1.2)
Total Protein: 5.5 g/dL — ABNORMAL LOW (ref 6.5–8.1)

## 2021-11-23 LAB — MAGNESIUM: Magnesium: 2 mg/dL (ref 1.7–2.4)

## 2021-11-23 LAB — TROPONIN I (HIGH SENSITIVITY): Troponin I (High Sensitivity): 185 ng/L (ref <18)

## 2021-11-23 LAB — TSH: TSH: 0.803 u[IU]/mL (ref 0.350–4.500)

## 2021-11-23 LAB — PHOSPHORUS: Phosphorus: 2.1 mg/dL — ABNORMAL LOW (ref 2.5–4.6)

## 2021-11-23 LAB — D-DIMER, QUANTITATIVE: D-Dimer, Quant: 1.19 ug/mL-FEU — ABNORMAL HIGH (ref 0.00–0.50)

## 2021-11-23 MED ORDER — LORAZEPAM 2 MG/ML IJ SOLN
2.0000 mg | Freq: Once | INTRAMUSCULAR | Status: DC
  Filled 2021-11-23: qty 1

## 2021-11-23 MED ORDER — QUETIAPINE FUMARATE 50 MG PO TABS
25.0000 mg | ORAL_TABLET | Freq: Once | ORAL | Status: AC
  Administered 2021-11-23: 25 mg via ORAL
  Filled 2021-11-23 (×2): qty 1

## 2021-11-23 MED ORDER — ATENOLOL 50 MG PO TABS
50.0000 mg | ORAL_TABLET | Freq: Every day | ORAL | Status: DC
  Filled 2021-11-23 (×2): qty 1

## 2021-11-23 MED ORDER — ADULT MULTIVITAMIN W/MINERALS CH
1.0000 | ORAL_TABLET | Freq: Every day | ORAL | Status: DC
  Administered 2021-11-23 – 2021-12-01 (×7): 1 via ORAL
  Filled 2021-11-23 (×8): qty 1

## 2021-11-23 MED ORDER — METOPROLOL TARTRATE 5 MG/5ML IV SOLN
5.0000 mg | Freq: Once | INTRAVENOUS | Status: AC
  Administered 2021-11-23: 5 mg via INTRAVENOUS
  Filled 2021-11-23: qty 5

## 2021-11-23 MED ORDER — BOOST / RESOURCE BREEZE PO LIQD CUSTOM
1.0000 | Freq: Two times a day (BID) | ORAL | Status: DC
  Administered 2021-11-23 – 2021-11-29 (×5): 1 via ORAL

## 2021-11-23 MED ORDER — METOPROLOL TARTRATE 5 MG/5ML IV SOLN
5.0000 mg | Freq: Once | INTRAVENOUS | Status: AC
Start: 2021-11-23 — End: 2021-11-23
  Administered 2021-11-23: 5 mg via INTRAVENOUS
  Filled 2021-11-23: qty 5

## 2021-11-23 MED ORDER — DILTIAZEM LOAD VIA INFUSION
10.0000 mg | Freq: Once | INTRAVENOUS | Status: AC
  Administered 2021-11-23: 10 mg via INTRAVENOUS
  Filled 2021-11-23: qty 10

## 2021-11-23 MED ORDER — DILTIAZEM HCL-DEXTROSE 125-5 MG/125ML-% IV SOLN (PREMIX)
5.0000 mg/h | INTRAVENOUS | Status: DC
  Administered 2021-11-23 – 2021-11-24 (×2): 5 mg/h via INTRAVENOUS
  Filled 2021-11-23 (×2): qty 125

## 2021-11-23 MED ORDER — APIXABAN 5 MG PO TABS
5.0000 mg | ORAL_TABLET | Freq: Two times a day (BID) | ORAL | Status: DC
Start: 2021-11-23 — End: 2021-11-24
  Administered 2021-11-23 – 2021-11-24 (×3): 5 mg via ORAL
  Filled 2021-11-23 (×3): qty 1

## 2021-11-23 MED ORDER — IOHEXOL 350 MG/ML SOLN
100.0000 mL | Freq: Once | INTRAVENOUS | Status: AC | PRN
  Administered 2021-11-23: 59 mL via INTRAVENOUS

## 2021-11-23 MED ORDER — METOPROLOL TARTRATE 5 MG/5ML IV SOLN: 5.0000 mg | Freq: Four times a day (QID) | INTRAVENOUS | Status: DC

## 2021-11-23 MED ORDER — HALOPERIDOL LACTATE 5 MG/ML IJ SOLN
5.0000 mg | Freq: Once | INTRAMUSCULAR | Status: AC
  Administered 2021-11-23: 5 mg via INTRAVENOUS

## 2021-11-23 MED ORDER — POTASSIUM CHLORIDE CRYS ER 20 MEQ PO TBCR
40.0000 meq | EXTENDED_RELEASE_TABLET | Freq: Once | ORAL | Status: AC
  Administered 2021-11-23: 40 meq via ORAL
  Filled 2021-11-23: qty 2

## 2021-11-23 MED ORDER — HALOPERIDOL LACTATE 5 MG/ML IJ SOLN: 5.0000 mg | Freq: Four times a day (QID) | INTRAMUSCULAR | Status: DC | PRN

## 2021-11-23 MED ORDER — HALOPERIDOL LACTATE 5 MG/ML IJ SOLN
5.0000 mg | Freq: Once | INTRAMUSCULAR | Status: DC
  Filled 2021-11-23: qty 1

## 2021-11-23 NOTE — Plan of Care (Signed)
  Problem: Clinical Measurements: Goal: Respiratory complications will improve Outcome: Progressing   Problem: Coping: Goal: Level of anxiety will decrease Outcome: Progressing   Problem: Elimination: Goal: Will not experience complications related to bowel motility Outcome: Progressing Goal: Will not experience complications related to urinary retention Outcome: Progressing   Problem: Pain Managment: Goal: General experience of comfort will improve Outcome: Progressing   Problem: Activity: Goal: Risk for activity intolerance will decrease Outcome: Not Progressing

## 2021-11-23 NOTE — Consult Note (Signed)
NAME:  Jared White, MRN:  [id number], DOB:  [birth year], LOS: 1 ADMISSION DATE:  11/21/2021, CONSULTATION DATE:  11/23/21 REFERRING MD:  Terrilee Croak, MD CHIEF COMPLAINT:  lung nodule  History of Present Illness:  Jared White is a 76 year old man, never smoker with coronary artery disease and rectal cancer status post radiation, neoadjuvant treatment, diverting ileostomy s/p reversal with recent anastomotic recurrence, chronic diarrhea, hypocalcemia who was admitted on 11/21/21 for acute on chronic hypocalcemia. He has new onset atrial fibrillation started on eliquis. He is awaiting permanent colostomy surgery but this has been delayed.  CTA Chest was ordered for elevated d-dimer. PCCM has been consulted for 6-89m nodule (previously 581m1/2023).  He is a never smoker and no significant second hand smoke exposure. Denies cough or hemoptysis. He reports 60lb weight loss over recent months due to significant diarrhea.   He worked in [employer]itectHe reports significant family history of cancer. His sister had lung cancer. He reports multiple uncles and cousins with lung cancer. His brother had metastatic prostate cancer.   Pertinent  Medical History  Rectal Cancer - 07/2019 GERD Coronary Artery Disease s/p PCI and CABG Hypertension  Significant Hospital Events: Including procedures, antibiotic start and stop dates in addition to other pertinent events     Interim History / Subjective:  As above.  Objective   Blood pressure 111/65, pulse 73, temperature 98.4 F (36.9 C), temperature source Oral, resp. rate 20, height '5\' 8"'$  (1.727 m), weight 55.9 kg, SpO2 100 %.        Intake/Output Summary (Last 24 hours) at 11/23/2021 1809 Last data filed at 11/23/2021 1700 Gross per 24 hour  Intake 1255.04 ml  Output 1725 ml  Net -469.96 ml   Filed Weights   11/21/21 1616 11/22/21 0553  Weight: 58.1 kg 55.9 kg   Examination: General: elderly male, no acute distress, thin HENT: [place]/AT, moist mucous  membranes Lungs: clear to auscultation bilaterally. No wheezing. Cardiovascular: irregularly irregular, no murmurs Abdomen: soft, non-tender, non-distended, BS+ Extremities: warm, no edema Neuro: alert, oriented, moving all extremities GU: n/a  CTA Chest 11/23/21 1. Negative for acute pulmonary embolus. 2. Slowly enlarging solid right upper lobe pulmonary nodule since last year, now 6-7 mm. This is more suspicious for Bronchogenic Carcinoma than solitary lung metastasis. [facility] 3. No other acute finding in the chest.  Prior CABG. 4. Stable visible upper abdomen.  Resolved Hospital Problem list     Assessment & Plan:  Pulmonary Nodule Rectal Cancer   Plan: - Discussed option of follow up in 3 months with repeat CT chest scan for monitoring or moving forward with navigational bronchoscopy given his history of rectal cancer and strong family history of cancer. He would like to move forward with navigational bronchoscopy at earliest possible time depending on when his colon surgery will be scheduled.  - PCCM will follow up tomorrow as he reports he is being seen by the surgical team for further procedure planning.  - I will then discuss case with Dr. IcValeta Harmsnd Dr. ByLamonte Sakaind when we can schedule him for bronchoscopy.    Best Practice (right click and "Reselect all SmartList Selections" daily)   Per Primary  Labs   CBC: Recent Labs  Lab 11/21/21 1719 11/22/21 0402 11/23/21 0143  WBC 10.7* 14.1* 9.6  NEUTROABS  --   --  7.6  HGB 12.1* 11.7* 10.9*  HCT 36.6* 34.3* 31.3*  MCV 95.8 93.0 93.4  PLT 165 141* 106*    Basic Metabolic Panel: Recent Labs  Lab 11/21/21 1719 11/21/21 2244 11/22/21 0402 11/23/21 0143  NA 143  --  137 139  K 3.9  --  3.2* 3.5  CL 107  --  105 110  CO2 23  --  21* 20*  GLUCOSE 143*  --  142* 93  BUN 24*  --  18 20  CREATININE 1.32*  --  1.09 0.98  CALCIUM 6.7*  --  6.7* 7.0*   MG  --  <0.5* 0.9* 2.0  PHOS  --   --   --  2.1*   GFR: Estimated Creatinine Clearance: 51.5 mL/min (by C-G formula based on SCr of 0.98 mg/dL). Recent Labs  Lab 11/21/21 1719 11/22/21 0402 11/23/21 0143  WBC 10.7* 14.1* 9.6    Liver Function Tests: Recent Labs  Lab 11/21/21 1719 11/23/21 0143  AST 24 31  ALT 23 17  ALKPHOS 67 58  BILITOT 1.1 0.8  PROT 6.1* 5.5*  ALBUMIN 3.3* 2.9*   No results for input(s): "LIPASE", "AMYLASE" in the last 168 hours. No results for input(s): "AMMONIA" in the last 168 hours.  ABG    Component Value Date/Time   TCO2 24 12/14/2012 1734     Coagulation Profile: No results for input(s): "INR", "PROTIME" in the last 168 hours.  Cardiac Enzymes: No results for input(s): "CKTOTAL", "CKMB", "CKMBINDEX", "TROPONINI" in the last 168 hours.  HbA1C: Hgb A1c MFr Bld  Date/Time Value Ref Range Status  11/01/2021 10:43 AM 5.8 (H) 4.8 - 5.6 % Final    Comment:    (NOTE) Pre diabetes:          5.7%-6.4%  Diabetes:              >6.4%  Glycemic control for   <7.0% adults with diabetes   07/07/2021 08:55 AM 5.7 (H) 4.8 - 5.6 % Final    Comment:    (NOTE) Pre diabetes:          5.7%-6.4%  Diabetes:              >6.4%  Glycemic control for   <7.0% adults with diabetes     CBG: No results for input(s): "GLUCAP" in the last 168 hours.  Review of Systems:   Review of Systems  Constitutional:  Positive for weight loss. Negative for chills, fever and malaise/fatigue.  HENT:  Negative for congestion, sinus pain and sore throat.   Eyes: Negative.   Respiratory:  Negative for cough, hemoptysis, sputum production, shortness of breath and wheezing.   Cardiovascular:  Negative for chest pain, palpitations, orthopnea, claudication and leg swelling.  Gastrointestinal:  Positive for diarrhea. Negative for abdominal pain, heartburn, nausea and vomiting.  Genitourinary: Negative.   Musculoskeletal:  Negative for joint pain and myalgias.  Skin:   Negative for rash.  Neurological:  Negative for weakness.  Endo/Heme/Allergies: Negative.   Psychiatric/Behavioral: Negative.     Past Medical History:  He,  has a past medical history of Arthritis, Blood transfusion without reported diagnosis, Broken back, CAD (coronary artery disease), Cataract, CHF (congestive heart failure) ([place]), Diverticulitis, Dysrhythmia, Family history of heart disease, GERD (gastroesophageal reflux disease), Heart murmur, History of kidney stones, History of nuclear stress test (06/30/2011), Hyperlipidemia, Hypertension, IgA nephropathy, Irregular heart beat, Myocardial infarction ([place]), Pre-diabetes, Rectal cancer ([place]) (08/19/2019), and Systemic hypertension.   Surgical History:   Past Surgical History:  Procedure Laterality Date   CARDIAC CATHETERIZATION  08/22/1995   PTCA of  OM (Dr. Marella Chimes)   COLONOSCOPY     CORONARY ARTERY BYPASS GRAFT  01/22/2000   x5 - LIMA to LAD, SVG to diagonal, sequential SVG to ramus & OM, SVG to PDA (Dr. Tharon Aquas Trigt)   DIVERTING ILEOSTOMY N/A 06/10/2020   Procedure: DIVERTING LOOP ILEOSTOMY;  Surgeon: Leighton Ruff, MD;  Location: [facility];  Service: General;  Laterality: N/A;   ILEOSTOMY CLOSURE N/A 10/09/2020   Procedure: LOOP ILEOSTOMY REVERSAL;  Surgeon: Leighton Ruff, MD;  Location: [facility];  Service: General;  Laterality: N/A;   IR IMAGING GUIDED PORT INSERTION  09/04/2019   LUNG LOBECTOMY     left upper lobe   RECTAL BIOPSY N/A 07/09/2021   Procedure: BIOPSY RECTAL;  Surgeon: Leighton Ruff, MD;  Location: [facility];  Service: General;  Laterality: N/A;   RECTAL EXAM UNDER ANESTHESIA N/A 07/09/2021   Procedure: ANAL RECTAL EXAM UNDER ANESTHESIA, RIGID PROCTOSCOPY;  Surgeon: Leighton Ruff, MD;  Location: [facility];  Service: General;  Laterality: N/A;   TRANSTHORACIC ECHOCARDIOGRAM  12/24/2012   EF 55-60%, mild LVH, mild conc hypertrophy; mild MR; LA mildly dilated   XI ROBOTIC ASSISTED LOWER ANTERIOR RESECTION N/A  06/10/2020   Procedure: XI ROBOTIC ASSISTED LOWER ANTERIOR RESECTION, MOBILIZATION OF SPLENIC FLEXURE, RIGID PROCTOSCOPY;  Surgeon: Leighton Ruff, MD;  Location: [facility];  Service: General;  Laterality: N/A;     Social History:   reports that he has never smoked. He has never used smokeless tobacco. He reports that he does not drink alcohol and does not use drugs.   Family History:  His family history includes Heart attack in his brother and father; Heart disease in his brother and brother; Hypertension in his brother and brother; Kidney failure in his brother; Prostate cancer in his brother. There is no history of Colon cancer, Esophageal cancer, Rectal cancer, Stomach cancer, Inflammatory bowel disease, Liver disease, Pancreatic cancer, or Colon polyps.   Allergies No Known Allergies   Home Medications  Prior to Admission medications   Medication Sig Start Date End Date Taking? Authorizing Provider  AMLODIPINE BESYLATE PO Take 1 tablet by mouth 2 (two) times daily.   Yes [provider]  Ascorbic Acid (VITAMIN C PO) Take 1 tablet by mouth daily.   Yes [provider]  aspirin EC 81 MG tablet Take 1 tablet (81 mg total) by mouth daily. Swallow whole. Patient taking differently: Take 162 mg by mouth daily. 08/11/21  Yes Patwardhan, Manish J, MD  atenolol (TENORMIN) 50 MG tablet TAKE 1 TABLET BY MOUTH EVERY DAY Patient taking differently: Take 50 mg by mouth daily. 03/30/21  Yes Patwardhan, Manish J, MD  b complex vitamins capsule Take 1 capsule by mouth daily.   Yes [provider]  CALCIUM PO Take 1 tablet by mouth daily.   Yes [provider]  Cholecalciferol (VITAMIN D-3 PO) Take 1 capsule by mouth daily.   Yes [provider]  MAGNESIUM PO Take 1 tablet by mouth daily.   Yes [provider]  nitroGLYCERIN (NITROSTAT) 0.4 MG SL tablet Place 0.4 mg under the tongue every 5 (five) minutes x 3 doses as needed for chest pain. 11/16/19  Yes  [provider]  omeprazole (PRILOSEC) 40 MG capsule TAKE 1 CAPSULE (40 MG TOTAL) BY MOUTH DAILY. 03/03/21  Yes Mansouraty, Telford Nab., MD  rosuvastatin (CRESTOR) 20 MG tablet TAKE 1 TABLET BY MOUTH EVERY DAY Patient taking differently: Take 20 mg by mouth daily. 06/02/21  Yes Patwardhan, Manish J,  MD  valsartan (DIOVAN) 160 MG tablet TAKE 1 TABLET BY MOUTH EVERY DAY Patient taking differently: Take 160 mg by mouth daily. 06/02/21  Yes Patwardhan, Manish J, MD  VITAMIN A PO Take 1 tablet by mouth daily.   Yes [provider]  VITAMIN E PO Take 1 capsule by mouth daily at 6 (six) AM.   Yes [provider]     Critical care time: n/a    Freda Jackson, MD [place] Office: [phone]   See Amion for personal pager PCCM on call pager [phone] until 7pm. Please call Elink 7p-7a. [phone]

## 2021-11-23 NOTE — Progress Notes (Signed)
PROGRESS NOTE  Jared White  DOB: April 25, 1946  PCP: Caprice Red, MD IOE:[id number]  DOA: 11/21/2021  LOS: 1 day  Hospital Day: 3  Brief narrative: Jared White is a 76 y.o. male with PMH significant for HTN, HLD, CAD/stent/CABG, CHF, GERD, IgA nephropathy, CKD, rectal cancer status post radiation, neoadjuvant treatment, diverting ileostomy s/p reversal with recent anastomotic recurrence, chronic diarrhea, hypocalcemia.   Patient presented to the ED on 7/2 with complaint of constant nausea, driving for the past 2 days leading to poor oral intake, increased weakness, impaired gait, decreased urine output. He has chronic diarrhea due to colon cancer, has low appetite and was only able to tolerate liquid diet.  He is on chronic calcium and magnesium supplementation.  For the past 2 to 3 weeks, patient has also noted right paralumbar pain worse with movement and better at rest.   In the ED, patient was afebrile, hemodynamically stable. Labs showed low serum calcium and magnesium level He was noted to have urinary retention of more than 470 ml.  Foley catheter was inserted. Admitted to hospital service for further evaluation management.  Subjective: Patient was seen and examined this morning.   Patient had an eventful morning.  He was in A-fib with RVR at 160 bpm.  He was extremely restless, agitated pulling lines out.  He required IM Haldol, bilateral restraints and security onsite.  Mental status gradually improved.  By the time of my evaluation, he was alert, awake, oriented to place and person and did not have recollection of the events from this morning.    Assessment and plan: Acute on chronic hypocalcemia and hypomagnesemia -Patient has chronically low calcium and magnesium level due to chronic diarrhea and is on regular calcium and magnesium supplementation at home. -Presented with generalized weakness, poor appetite. -Levels as below showed significant deficit. -Oral and IV  replacements were given. -Repeat labs tomorrow. Recent Labs  Lab 11/21/21 1719 11/21/21 2244 11/22/21 0402 11/23/21 0143  CALCIUM 6.7*  --  6.7* 7.0*  MG  --  <0.5* 0.9* 2.0   A-fib with RVR -New onset A-fib with heart rate up to 160s this morning. -PTA, patient was on atenolol for blood pressure.  It was resumed.  Patient also was started on Cardizem drip.  Heart rate gradually improving.   -Continue Cardizem drip and telemetry monitoring. -I would hold off on anticoagulation at this time because of recurrent agitation, restlessness and risk of fall.    Acute metabolic encephalopathy Acute delirium -Patient was significantly confused, restless and agitated this morning.  He has risk factors for vascular dementia. -Given IV Haldol 5 mg, every 6 hours as needed.  Oral Seroquel 25 mg given as well. -Continue to monitor mental status.  AKI on CKD2 History of IgA nephropathy -Recent creatinine less than 1 in February 2023 suggestive of CKD 2 -Creatinine elevated at 1.32 at presentation.  Improving with IV fluid. Recent Labs    12/01/20 1025 07/07/21 0855 09/06/21 1103 11/01/21 1045 11/21/21 1719 11/22/21 0402 11/23/21 0143  BUN 28* 19 25* 28* 24* 18 20  CREATININE 1.50* 0.93 1.17 1.65* 1.32* 1.09 0.98   Recurrence of rectal cancer  -3/21 rectal cancer diagnosed after seen colonoscopy.  Completed total neoadjuvant treatment with Dr. Tami Lin.  Finished radiation in 11/21.  Underwent APR on January 2022 with a diverting ileostomy.  May 2022, underwent ileostomy reversal.  Follow-up endoscopy recently showed an anastomotic polyp biopsy of which showed adenocarcinoma. -General surgery planned for permanent colostomy but the  surgery had to be canceled twice.  First time was because of poor bowel prep and second time on 11/10/2021 was because of hypocalcemia.   -I sent a message to surgeon Dr. Marcello Moores if can get surgery done while inpatient because it will be hard to find a SNF that would  take him and sent back for an elective surgery.   Pulmonary nodule -CT angio of chest this morning was negative for acute pulmonary embolism but showed slowly enlarging solid right upper lobe pulmonary nodule since last check, now 6 to 7 mm, this is more suspicious for bronchogenic carcinoma then solitary lung metastasis. -Pulmonary consulted  Chronic diarrhea/constipation -Patient states he leaks his stool most of the time.  Takes Imodium as needed but tends to get constipated with that.   -Continue Questran.  Elevated troponin -No symptoms of chest pain or shortness of breath.   -Troponin decreasing trend as below.  Suspect demand ischemia in the setting of dehydration with underlying history of CABG -Echocardiogram 7/21.  Monitor. Recent Labs    11/21/21 2241 11/22/21 0325 11/23/21 0428  TROPONINIHS 108* 392* 185*   HTN -PTA on atenolol, amlodipine, diovan.. -Continue atenolol.  Keep amlodipine Diovan on hold while on Cardizem drip. -7/3, echocardiogram showed EF 60 to 65%, normal LV function, mild LVH  Chronic L2 compression fracture -Reported several weeks of right paralumbar spinal pain worse with movement and better at rest. Pain is reproducible with palpation on exam. -Lumbar spine x-ray showed chronic compression fracture deformity at the level of L2. Moderate to marked severity multilevel degenerative disc disease.  Bilateral hydroureteronephrosis -CT abdomen noted bilateral hydroureteronephrosis secondary to urinary retention likely at the level of bladder neck.   -Patient denies any history of BPH.  CT abdomen showed mildly enlarged prostate. -Although there is evidence of punctate bilateral intrarenal calculi, no evidence of obstructive urinary stone or mass.   -More than 450 mL of urine retention was noted in CT scan.  Foley catheter was placed. -Plan voiding trial prior to discharge   History of GERD -CT abdomen showed chronic diffuse gastric wall thickening,  small hiatal hernia with wall thickening of the distal esophagus, can be seen with reflux or esophagitis. -Continue PPI  Colonic diverticulosis without focal diverticulitis -Has alternating diarrhea and constipation apparently  Cholelithiasis without gallbladder inflammation -No neurosurgical intervention at this time.  Goals of care   Code Status: Full Code    Mobility: Encourage ambulation.  PT eval ordered  Skin assessment:     Nutritional status:  Body mass index is 18.74 kg/m.  Nutrition Problem: Severe Malnutrition Etiology: chronic illness (colon cancer) Signs/Symptoms: moderate fat depletion, severe fat depletion, severe muscle depletion, moderate muscle depletion     Diet:  Diet Order             Diet regular Room service appropriate? Yes; Fluid consistency: Thin  Diet effective now                   DVT prophylaxis:   apixaban (ELIQUIS) tablet 5 mg   Antimicrobials: None Fluid: Can stop IV fluid Consultants: None yet Family Communication: Brother at bedside  Status is: Observation  Continue in-hospital care because: Electrolytes improving but patient with worsening confusion, A-fib with RVR today. Level of care: Telemetry Medical   Dispo: The patient is from: Home              Anticipated d/c is to: Pending clinical course.  PT recommended SNF  Patient currently is not medically stable to d/c.   Difficult to place patient No     Infusions:   diltiazem (CARDIZEM) infusion 5 mg/hr (11/23/21 1006)    Scheduled Meds:  apixaban  5 mg Oral BID   aspirin EC  81 mg Oral Daily   atenolol  50 mg Oral Daily   calcium carbonate  400 mg of elemental calcium Oral Daily   Chlorhexidine Gluconate Cloth  6 each Topical Q0600   cholestyramine  4 g Oral BID   feeding supplement  1 Container Oral BID BM   feeding supplement  237 mL Oral BID BM   LORazepam  2 mg Intravenous Once   multivitamin with minerals  1 tablet Oral Daily    pantoprazole  40 mg Oral Daily   potassium chloride  40 mEq Oral Daily   rosuvastatin  20 mg Oral Daily   sodium chloride flush  10-40 mL Intracatheter Q12H    PRN meds: ondansetron (ZOFRAN) IV, mouth rinse, sodium chloride flush   Antimicrobials: Anti-infectives (From admission, onward)    None       Objective: Vitals:   11/23/21 1215 11/23/21 1227  BP:  111/66  Pulse: 88 92  Resp:  14  Temp:  98.2 F (36.8 C)  SpO2: 95% 90%    Intake/Output Summary (Last 24 hours) at 11/23/2021 1402 Last data filed at 11/23/2021 0903 Gross per 24 hour  Intake 1157 ml  Output 1725 ml  Net -568 ml   Filed Weights   11/21/21 1616 11/22/21 0553  Weight: 58.1 kg 55.9 kg   Weight change:  Body mass index is 18.74 kg/m.   Physical Exam: General exam: Pleasant, elderly Caucasian male.  Not in physical distress Skin: No rashes, lesions or ulcers. HEENT: Atraumatic, normocephalic, no obvious bleeding Lungs: Clear to auscultate bilaterally CVS: Irregular rhythm, fast rate. GI/Abd soft, nontender, nondistended, bowel sound present CNS: At the time of my evaluation, he was alert, awake, oriented to place and person but slow to respond. Psychiatry: Mood appropriate Extremities: No pedal edema, no calf tenderness  Data Review: I have personally reviewed the laboratory data and studies available.  F/u labs ordered Unresulted Labs (From admission, onward)     Start     Ordered   11/23/21 0500  CBC with Differential/Platelet  Daily,   R      11/22/21 0820   11/23/21 0500  Comprehensive metabolic panel  Daily,   R      11/22/21 0820            Signed, Terrilee Croak, MD [facility] 11/23/2021

## 2021-11-23 NOTE — Progress Notes (Addendum)
HOSPITAL MEDICINE OVERNIGHT EVENT NOTE    Notified by nursing that patient has been intermittently going into rapid atrial fibrillation with heart rates in excess of 150/bpm.  This may be a new diagnosis as I can see no mention of Afib in any of the patient's prior cardiology notes.   Patient is complaining of associated weakness , tremulousness and palpitations with each episode but denies CP or SOB.  Labs reviewed this AM.  Mg WNL but Potassium is 3.5, will give 63mq potassium chloride now.  Will additionally obtain TSH, Ddimer and Troponin with ECG.  Will administer trial dose of '5mg'$  IV Metoprolol to try and achieve rate/rhythm control.  If unsuccessful will consider diltiazem infusion.  EF is preserved per last echo.  Finally, CHADSVASC is 5 and therefore will initiate Eliquis '5mg'$  BID.  Patient will likely need formal cardiology consultation in the morning.    GVernelle Emerald MD [facility]   ADDENDUM (7/4 4:45am)  Patient exhibiting an excellent response to administration of 5 mg of intravenous metoprolol.  Heart rate is now just over 100 and patient is maintaining his blood pressure.  Patient is symptomatically improved.  We will schedule V milligrams of intravenous metoprolol every 6 hours for now and discontinue atenolol for the time being.  Day team to adjust AV nodal blocking therapy at their discretion.  GSherryll BurgerShalhoub   ADDENDUM 7/4 5:15am  Ddimer 1.19, will proceed with CTA chest.   GSherryll BurgerShalhoub  ADDENDUM (7/4 7am)  Patient back in rapid atrial fibrillation.  Will give an additional dose of '5mg'$  IV Metoprolol seeing as how responsive patient was to the last dose.  If not successful will try an alternative agent.   CTA shows no PE but does reveal an area concerning for bronchogenic carcinoma that will need to be addressed by day team.   GVernelle Emerald

## 2021-11-23 NOTE — Progress Notes (Signed)
Pt HR is elevated  to 150-170's. MD informed, awaiting response.

## 2021-11-23 NOTE — Evaluation (Signed)
Physical Therapy Evaluation Patient Details Name: Jared White MRN: [id number] DOB: 20-Jul-1945 Today's Date: 11/23/2021  History of Present Illness  76 y.o. male admitted 7/2 with increasing weakness, hypocalcemia and bil hydroureteronephrosis. new Afib 7/4 . PMhx: rectal mass s/p radiation with diverting ileostomy s/p reversal, chronic diarrhea, hypocalcemia, CAD s/p CABG, CKD, HTN, HLD  Clinical Impression  Pt pleasant, confused and does not recall events of the day. Brother-in-law present and stating only available for a few days until he returns to [place]. Pt with significantly impaired balance, strength, gait and functional mobility who will benefit from acute therapy to maximize mobility and safety. Pt will need to be mod I to return home and with limited family support anticipate pt will need [facility] at D/C. Encouraged OOB mobility with nursing assist.       Recommendations for follow up therapy are one component of a multi-disciplinary discharge planning process, led by the attending physician.  Recommendations may be updated based on patient status, additional functional criteria and insurance authorization.  Follow Up Recommendations Skilled nursing-short term rehab (<3 hours/day) Can patient physically be transported by private vehicle: Yes    Assistance Recommended at Discharge Frequent or constant Supervision/Assistance  Patient can return home with the following  A lot of help with walking and/or transfers;A lot of help with bathing/dressing/bathroom;Assist for transportation;Direct supervision/assist for medications management;Direct supervision/assist for financial management;Assistance with cooking/housework;Help with stairs or ramp for entrance    Equipment Recommendations Rolling walker (2 wheels);BSC/3in1  Recommendations for Other Services       Functional Status Assessment Patient has had a recent decline in their functional status and demonstrates the ability to make  significant improvements in function in a reasonable and predictable amount of time.     Precautions / Restrictions Precautions Precautions: Fall      Mobility  Bed Mobility Overal bed mobility: Needs Assistance Bed Mobility: Supine to Sit     Supine to sit: Min assist, HOB elevated     General bed mobility comments: HOb 20 degrees with min assist to scoot to EOB, increased time and effort with use of rail    Transfers Overall transfer level: Needs assistance   Transfers: Sit to/from Stand Sit to Stand: Min assist           General transfer comment: min assist to rise from surface, mod assist to stabilize in standing    Ambulation/Gait Ambulation/Gait assistance: Mod assist Gait Distance (Feet): 100 Feet Assistive device: Rolling walker (2 wheels) Gait Pattern/deviations: Step-through pattern, Trunk flexed, Narrow base of support, Drifts right/left, Shuffle   Gait velocity interpretation: <1.31 ft/sec, indicative of household ambulator   General Gait Details: pt with mod assist progressing to min assist last 77' with significant posterior lean with physical assist to control balance, direction and RW  Stairs            Wheelchair Mobility    Modified Rankin (Stroke Patients Only)       Balance Overall balance assessment: Needs assistance   Sitting balance-Leahy Scale: Poor Sitting balance - Comments: min assist for static sitting Postural control: Posterior lean Standing balance support: Bilateral upper extremity supported Standing balance-Leahy Scale: Poor Standing balance comment: mod assist for standing balance due to posterior lean                             Pertinent Vitals/Pain Pain Assessment Pain Assessment: No/denies pain    Home Living Family/patient expects  to be discharged to:: Private residence Living Arrangements: Alone Available Help at Discharge: Family;Available PRN/intermittently Type of Home: House Home  Access: Stairs to enter   Entrance Stairs-Number of Steps: 1   Home Layout: One level Home Equipment: Conservation officer, nature (2 wheels);Shower seat;Cane - single point;Cane - quad      Prior Function Prior Level of Function : Independent/Modified Independent                     Hand Dominance        Extremity/Trunk Assessment   Upper Extremity Assessment Upper Extremity Assessment: Generalized weakness    Lower Extremity Assessment Lower Extremity Assessment: Generalized weakness    Cervical / Trunk Assessment Cervical / Trunk Assessment: Normal  Communication   Communication: No difficulties  Cognition Arousal/Alertness: Awake/alert Behavior During Therapy: WFL for tasks assessed/performed Overall Cognitive Status: Impaired/Different from baseline Area of Impairment: Orientation, Attention, Memory, Following commands, Safety/judgement                 Orientation Level: Disoriented to, Time, Place Current Attention Level: Selective Memory: Decreased short-term memory Following Commands: Follows one step commands consistently Safety/Judgement: Decreased awareness of safety     General Comments: pt with agitation and combative this am but does not recall. Pt stating he is at [facility] and unaware of deficits with mobility        General Comments      Exercises     Assessment/Plan    PT Assessment Patient needs continued PT services  PT Problem List Decreased strength;Decreased mobility;Decreased safety awareness;Decreased coordination;Decreased activity tolerance;Decreased cognition;Decreased balance;Decreased knowledge of use of DME       PT Treatment Interventions Gait training;Balance training;Functional mobility training;Stair training;Cognitive remediation;Therapeutic activities;Patient/family education;Therapeutic exercise;DME instruction    PT Goals (Current goals can be found in the Care Plan section)  Acute Rehab PT Goals Patient Stated Goal: be  able to return home, work on my cars PT Goal Formulation: With patient/family Time For Goal Achievement: 12/07/21 Potential to Achieve Goals: Fair    Frequency Min 3X/week     Co-evaluation               AM-PAC PT "6 Clicks" Mobility  Outcome Measure Help needed turning from your back to your side while in a flat bed without using bedrails?: A Little Help needed moving from lying on your back to sitting on the side of a flat bed without using bedrails?: A Little Help needed moving to and from a bed to a chair (including a wheelchair)?: A Lot Help needed standing up from a chair using your arms (e.g., wheelchair or bedside chair)?: A Lot Help needed to walk in hospital room?: A Lot Help needed climbing 3-5 steps with a railing? : Total 6 Click Score: 13    End of Session   Activity Tolerance: Patient tolerated treatment well Patient left: in chair;with call bell/phone within reach;with family/visitor present;with chair alarm set Nurse Communication: Mobility status PT Visit Diagnosis: Other abnormalities of gait and mobility (R26.89);Difficulty in walking, not elsewhere classified (R26.2);Muscle weakness (generalized) (M62.81)    Time: 3716-9678 PT Time Calculation (min) (ACUTE ONLY): 23 min   Charges:   PT Evaluation $PT Eval Moderate Complexity: 1 Mod PT Treatments $Gait Training: 8-22 mins        Bayard Males, PT Acute Rehabilitation Services Office: [phone]   Sandy Salaam Tameya Kuznia 11/23/2021, 12:21 PM

## 2021-11-23 NOTE — Progress Notes (Signed)
Pt HR is in the 140s and BP is 155/98 (114). Pt states "I feel like my heart is racing". MD paged awaiting a response.

## 2021-11-23 NOTE — Progress Notes (Signed)
ANTICOAGULATION CONSULT NOTE - Initial Consult  Pharmacy Consult for Apixaban  Indication: atrial fibrillation  No Known Allergies  Patient Measurements: Height: '5\' 8"'$  (172.7 cm) Weight: 55.9 kg (123 lb 3.8 oz) IBW/kg (Calculated) : 68.4  Vital Signs: Temp: 97.9 F (36.6 C) (07/04 0317) Temp Source: Oral (07/04 0317) BP: 158/97 (07/04 0336) Pulse Rate: 90 (07/03 2332)  Labs: Recent Labs    11/21/21 1719 11/21/21 2241 11/22/21 0325 11/22/21 0402 11/23/21 0143  HGB 12.1*  --   --  11.7* 10.9*  HCT 36.6*  --   --  34.3* 31.3*  PLT 165  --   --  141* 106*  CREATININE 1.32*  --   --  1.09 0.98  TROPONINIHS  --  108* 392*  --   --     Estimated Creatinine Clearance: 51.5 mL/min (by C-G formula based on SCr of 0.98 mg/dL).   Medical History: Past Medical History:  Diagnosis Date   Arthritis    Blood transfusion without reported diagnosis    Broken back    CAD (coronary artery disease)    PCI & CABG   Cataract    "LIGHT"-RIGHT   CHF (congestive heart failure) (HCC)    Diverticulitis    mild - approx 2004   Dysrhythmia    occasional arrythmias   Family history of heart disease    GERD (gastroesophageal reflux disease)    Heart murmur    HX OF YEARS AGO    History of kidney stones    History of nuclear stress test 06/30/2011   lexiscan; normal pattern of perfusion post-stress; low risk scan    Hyperlipidemia    Hypertension    IgA nephropathy    Irregular heart beat    Myocardial infarction [facility])    Pre-diabetes    Rectal cancer ([place]) 08/19/2019   Systemic hypertension     Assessment: 76 y/o M with new onset atrial fibrillation. Remote hx of CABG. Starting Apixaban. Hgb 10.9. Renal function ok. PTA meds reviewed.   Goal of Therapy:  Monitor platelets by anticoagulation protocol: Yes   Plan:  Start Apixaban 5 mg BID Daily CBC while inpatient Monitor for bleeding F/U cardiology consult  Narda Bonds, PharmD, BCPS Clinical Pharmacist Phone:  [phone]

## 2021-11-23 NOTE — Significant Event (Signed)
Rapid Response Event Note   Reason for Call :  Afib RVR, patient confused and refusing care from RN  Initial Focused Assessment:  Patient sitting on the end of the bed. He had pulled out his IV from his left AC. He endorses his heart rate is elevated, but does not want nursing staff to give him any medication. He is oriented x4, but he also made comments like "I need to go to the hospital to get my heart rate checked". He also was exhibiting paranoid behavior in regards to the nursing staff.   Pt moving all extremities appropriately. He is able to appropriately identify objects. EOMI. No acute neurological deficits noted.   VS: T 98.56F, BP 132/96, HR 138, RR 20, RR 93% on room air  Interventions:  -Bilateral wrist restraints applied -Cardizem gtt started with '10mg'$  loading dose -Haldol '5mg'$  IV given for agitation  Plan of Care:  -Continue to titrate Cardizem gtt for HR goal -Reevaluate need for restraints  Call rapid response for additional needs  Event Summary:  MD Notified: Dr. Pietro Cassis Call Time: 0730- Advised RN to notify MD as I was not able to attend to patient at time of call Arrival Time: 0830 End Time: 0900  Casimer Bilis, RN

## 2021-11-23 NOTE — Progress Notes (Signed)
MD Dahal paged at [phone] for high HR  while in Afib by night shift nurse, metoprolol '5mg'$  given at 0721. Received orders to start Cardizem gtt and give Seroquel. Went into room to give medications and patient is refusing care and trying to get out of bed. Patient called 911 and stated that we are trying to hurt him. Patient then pulled foley catheter bag out and left AC IV and continued to try to get out of bed. Paged MD Dahal again. Orders to give IM haldol and start soft wrist restraints. Patient is now combative and requesting that no one enter the room, paged MD again and called security and rapid response due to HR being between 150-170. Security, brother in law, and rapid response nurse at bedside. RR nurse Wilburn Cornelia started IV Cardizem at '5mg'$ /hr, started soft wrist restraints, replaced foley bag, and gave haldol. Will continue to monitor.

## 2021-11-23 NOTE — Progress Notes (Signed)
Initial Nutrition Assessment  DOCUMENTATION CODES:   Severe malnutrition in context of chronic illness  INTERVENTION:  Encourage adequate PO intake Ensure Enlive po BID, each supplement provides 350 kcal and 20 grams of protein. Boost Breeze po BID, each supplement provides 250 kcal and 9 grams of protein Magic cup TID with meals, each supplement provides 290 kcal and 9 grams of protein MVI with minerals daily  NUTRITION DIAGNOSIS:   Severe Malnutrition related to chronic illness (colon cancer) as evidenced by moderate fat depletion, severe fat depletion, severe muscle depletion, moderate muscle depletion.  GOAL:   Patient will meet greater than or equal to 90% of their needs  MONITOR:   PO intake, Supplement acceptance, Labs, Weight trends  REASON FOR ASSESSMENT:   Malnutrition Screening Tool    ASSESSMENT:   Pt admitted with nausea and weakness, found to have hypocalcemia. PMH significant for rectal mass s/p radiation, neoadjuvant treatment, diverting ileostomy s/p reversal with recent anastomotic recurrence, chronic diarrhea, hypocalcemia, CAD s/p CABG, CKD IIIb, HTN and HLD.  Per review of chart, pt was seen outpt by general surgery and was planned for robotic assisted abdominal perineal resection with permanent colostomy however was postponed d/t admission.   Noted rapid response was called this morning d/t afib RVR, confusion and pt refusing care. Now with bilateral wrist restraints.   Per MD, CT shows concerns for bronchogenic carcinoma, will need further work up.   Spoke with pt at bedside. He expressed frustration over being in soft restraints. He has family present at bedside who assisted with providing history. He states that his intake has been significantly decreased within the last year since being on chemo and radiation. He also reports that when he eats, it's very minimal as he experiences early satiety. He has been eating some solids and liquids but states  that everything he eats leads to diarrhea.  Pt denies difficulty chewing or swallowing foods. Pt reports that he does not enjoy Ensure and is agreeable to try Boost Breeze, however when reviewing MAR he has received the last 3 orders of Ensure. Will continue to offer both for pt.    Meal completions: 07/03: 90%-lunch, 60%-dinner  Pt's family member reports that he has had about an 80 lb wt loss within the last year. Reviewed wt history. Within the last 6 months, it appears he has had a 12% wt loss.   Medications: calcium carbonate '400mg'$  daily, questran 4g BID, protonix, klor con  Labs: corrected calcium 7.88  UOP: 2.2L x24 hours I/O's: +527m since admission  NUTRITION - FOCUSED PHYSICAL EXAM:  Flowsheet Row Most Recent Value  Orbital Region Moderate depletion  Upper Arm Region Severe depletion  Thoracic and Lumbar Region Moderate depletion  Buccal Region Severe depletion  Temple Region Moderate depletion  Clavicle Bone Region Severe depletion  Clavicle and Acromion Bone Region Severe depletion  Scapular Bone Region Severe depletion  Dorsal Hand Moderate depletion  Patellar Region Severe depletion  Anterior Thigh Region Severe depletion  Posterior Calf Region Moderate depletion  Edema (RD Assessment) None  Hair Reviewed  Eyes Reviewed  Mouth Reviewed  Skin Reviewed  Nails Reviewed      Diet Order:   Diet Order             Diet regular Room service appropriate? Yes; Fluid consistency: Thin  Diet effective now                   EDUCATION NEEDS:   Education needs have been addressed  Skin:  Skin Assessment: Skin Integrity Issues: Skin Integrity Issues:: Other (Comment) Other: skin tear L coccyx, L elbow  Last BM:  7/3 (type 6)  Height:   Ht Readings from Last 1 Encounters:  11/21/21 '5\' 8"'$  (1.727 m)    Weight:   Wt Readings from Last 1 Encounters:  11/22/21 55.9 kg    Ideal Body Weight:  70 kg  BMI:  Body mass index is 18.74 kg/m.  Estimated  Nutritional Needs:   Kcal:  1700-1900  Protein:  80-95g  Fluid:  >/=1.7L  Clayborne Dana, RDN, LDN Clinical Nutrition

## 2021-11-23 NOTE — Progress Notes (Signed)
Pt tolerated Metoprolol well and his HR is 98-115 and BP 148/89(106). Pt is reporting a  hx of irregular HR.

## 2021-11-24 ENCOUNTER — Other Ambulatory Visit (HOSPITAL_COMMUNITY): Payer: Self-pay

## 2021-11-24 ENCOUNTER — Telehealth (HOSPITAL_COMMUNITY): Payer: Self-pay | Admitting: Pharmacy Technician

## 2021-11-24 LAB — CBC WITH DIFFERENTIAL/PLATELET
Abs Immature Granulocytes: 0.04 10*3/uL (ref 0.00–0.07)
Basophils Absolute: 0 10*3/uL (ref 0.0–0.1)
Basophils Relative: 0 %
Eosinophils Absolute: 0.1 10*3/uL (ref 0.0–0.5)
Eosinophils Relative: 1 %
HCT: 29.1 % — ABNORMAL LOW (ref 39.0–52.0)
Hemoglobin: 9.9 g/dL — ABNORMAL LOW (ref 13.0–17.0)
Immature Granulocytes: 1 %
Lymphocytes Relative: 10 %
Lymphs Abs: 0.7 10*3/uL (ref 0.7–4.0)
MCH: 32.2 pg (ref 26.0–34.0)
MCHC: 34 g/dL (ref 30.0–36.0)
MCV: 94.8 fL (ref 80.0–100.0)
Monocytes Absolute: 0.5 10*3/uL (ref 0.1–1.0)
Monocytes Relative: 7 %
Neutro Abs: 5.5 10*3/uL (ref 1.7–7.7)
Neutrophils Relative %: 81 %
Platelets: 88 10*3/uL — ABNORMAL LOW (ref 150–400)
RBC: 3.07 MIL/uL — ABNORMAL LOW (ref 4.22–5.81)
RDW: 13.3 % (ref 11.5–15.5)
WBC: 6.8 10*3/uL (ref 4.0–10.5)
nRBC: 0 % (ref 0.0–0.2)

## 2021-11-24 LAB — COMPREHENSIVE METABOLIC PANEL
ALT: 19 U/L (ref 0–44)
AST: 27 U/L (ref 15–41)
Albumin: 2.7 g/dL — ABNORMAL LOW (ref 3.5–5.0)
Alkaline Phosphatase: 59 U/L (ref 38–126)
Anion gap: 10 (ref 5–15)
BUN: 18 mg/dL (ref 8–23)
CO2: 24 mmol/L (ref 22–32)
Calcium: 7.5 mg/dL — ABNORMAL LOW (ref 8.9–10.3)
Chloride: 106 mmol/L (ref 98–111)
Creatinine, Ser: 1.02 mg/dL (ref 0.61–1.24)
GFR, Estimated: >60 mL/min (ref >60)
Glucose, Bld: 107 mg/dL — ABNORMAL HIGH (ref 70–99)
Potassium: 3.6 mmol/L (ref 3.5–5.1)
Sodium: 140 mmol/L (ref 135–145)
Total Bilirubin: 0.7 mg/dL (ref 0.3–1.2)
Total Protein: 5.1 g/dL — ABNORMAL LOW (ref 6.5–8.1)

## 2021-11-24 MED ORDER — DILTIAZEM HCL 60 MG PO TABS
30.0000 mg | ORAL_TABLET | Freq: Four times a day (QID) | ORAL | Status: DC
  Administered 2021-11-24 (×3): 30 mg via ORAL
  Filled 2021-11-24 (×3): qty 1

## 2021-11-24 NOTE — Progress Notes (Signed)
Overnight event  Notified by RN that patient had a scant amount of blood in his stool possibly due to straining earlier.  He then had another bowel movement with a small amount of blood mixed in.  Patient endorsed mild rectal pain but no abdominal pain.  Vital signs stable.  Per chart review, he was started on Eliquis yesterday.  He is also on aspirin.  -Hold Eliquis and aspirin -Stat labs ordered to check H&H

## 2021-11-24 NOTE — Plan of Care (Signed)
  Problem: Clinical Measurements: Goal: Respiratory complications will improve Outcome: Progressing Goal: Cardiovascular complication will be avoided Outcome: Progressing   Problem: Coping: Goal: Level of anxiety will decrease Outcome: Progressing   Problem: Elimination: Goal: Will not experience complications related to bowel motility Outcome: Progressing Goal: Will not experience complications related to urinary retention Outcome: Progressing   Problem: Activity: Goal: Risk for activity intolerance will decrease Outcome: Not Progressing

## 2021-11-24 NOTE — TOC Benefit Eligibility Note (Signed)
Patient Teacher, English as a foreign language completed.    The patient is currently admitted and upon discharge could be taking Eliquis 5 mg.  The current 30 day co-pay is, $10.35.   The patient is insured through [place], [place] Patient Advocate Specialist [place] Patient Advocate Team Direct Number: [phone]  Fax: [phone]

## 2021-11-24 NOTE — Progress Notes (Signed)
   NAME:  BREVON DEWALD, MRN:  [id number], DOB:  1946-01-03, LOS: 2 ADMISSION DATE:  11/21/2021, CONSULTATION DATE:  7/4 REFERRING MD:  Dahal, CHIEF COMPLAINT:  Weakness   History of Present Illness:  76 y/o male with a complex cancer history presented with AKI and hypokalemia in the setting of bladder outlet obstruction.  A CT scan of his chest was performed and showed that a RUL nodule had increased in size from 69m to 6-794mbetween January 2023 and now.  PCCM was consulted for the same.  He is a life long non-smoker  Pertinent  Medical History  Rectal Cancer - 07/2019; s/p diverting colostomy (and later reversal), radiation, neo-adjuvant treatment; 2023 surveillance colonoscopy showed anastomotic recurrence GERD Coronary Artery Disease s/p PCI and CABG Hypertension  Significant Hospital Events: Including procedures, antibiotic start and stop dates in addition to other pertinent events   7/2 admission 7/4 CT angiogram chest > neg for PE, slowly enlarging RUL nodule previously 52m33mow 6-7 mm  Interim History / Subjective:   Feels about the same  Didn't get much rest Still would prefer to have biopsy rather than f/u repeat CT in 3 months  Objective   Blood pressure 135/77, pulse 90, temperature 98.6 F (37 C), temperature source Oral, resp. rate 20, height '5\' 8"'$  (1.727 m), weight 55.9 kg, SpO2 99 %.        Intake/Output Summary (Last 24 hours) at 11/24/2021 0926 Last data filed at 11/24/2021 0315 Gross per 24 hour  Intake 586.11 ml  Output 575 ml  Net 11.11 ml   Filed Weights   11/21/21 1616 11/22/21 0553  Weight: 58.1 kg 55.9 kg    Examination:  General:  Chronically ill appearing, resting comfortably in bed HENT: NCAT OP clear PULM: CTA B, normal effort CV: RRR, no mgr GI: BS+, soft, nontender MSK: normal bulk and tone Neuro: awake, alert, no distress, MAEW   Resolved Hospital Problem list     Assessment & Plan:  Adenocarcinoma of the colon with local recurrence AKI  due to bladder outlet obstruction Chronic diarrhea CAD RUL pulmonary nodule  Discussion: As noted previously, the patient would prefer a more aggressive diagnostic approach to the RUL nodule given his personal and family history of cancer.  Plan: Continue management for AKI and hypocalcemia as per hospitalists I have reached out to our advanced bronchoscopy team to consider scheduling a diagnostic bronchoscopy for the RUL nodule.  We will communicate plans to patient.   PCCM available PRN  Best Practice (right click and "Reselect all SmartList Selections" daily)   Per [facility]  BreRoselie AwkwardD [facility] PCCM Pager: [phone]: [phone] 7:00 pm call Elink  [phone]

## 2021-11-24 NOTE — Telephone Encounter (Signed)
Pharmacy Patient Advocate Encounter  Insurance verification completed.    The patient is insured through [employer] Part D   The patient is currently admitted and ran test claims for the following: Eliquis 5 mg.  Copays and coinsurance results were relayed to Inpatient clinical team.

## 2021-11-24 NOTE — Progress Notes (Addendum)
PROGRESS NOTE    Jared White  MWN:[id number] DOB: 01-Aug-1945 DOA: 11/21/2021 PCP: Caprice Red, MD     Brief Narrative:  Jared White is a 76 y.o. male with PMH significant for HTN, HLD, CAD/stent/CABG, CHF, GERD, IgA nephropathy, CKD, rectal cancer status post radiation, neoadjuvant treatment, diverting ileostomy s/p reversal with recent anastomotic recurrence, chronic diarrhea, hypocalcemia.    Patient presented to the ED on 7/2 with complaint of constant nausea for the past 2 days leading to poor oral intake, increased weakness, impaired gait, decreased urine output. He has chronic diarrhea due to colon cancer, has low appetite and was only able to tolerate liquid diet.  He is on chronic calcium and magnesium supplementation.  For the past 2 to 3 weeks, patient has also noted right paralumbar pain worse with movement and better at rest.    In the ED, patient was afebrile, hemodynamically stable. Labs showed low serum calcium and magnesium level. He was noted to have urinary retention of more than 470 ml.  Foley catheter was inserted. Admitted to hospital service for further evaluation management. Had episode of A-fib RVR on morning of 7/4, had extreme restlessness and agitation with altered mental status.  New events last 24 hours / Subjective: Patient seen with close friend at bedside.  Patient without any new complaints today.  Denies any chest pain, palpitations, nausea or vomiting.  Assessment & Plan:   Principal Problem:   Hypocalcemia Active Problems:   CAD (coronary artery disease)   Essential hypertension   S/P CABG x 4   Hypomagnesemia   Rectal cancer (HCC)   Acute renal failure superimposed on stage 3b chronic kidney disease (HCC)   Hydroureteronephrosis   Lumbar back pain   Elevated troponin   Protein-calorie malnutrition, severe   Acute on chronic hypocalcemia, hypomagnesemia -Has chronic low calcium and magnesium levels due to chronic diarrhea -Has been  replaced -Continue to monitor calcium and magnesium levels  A-fib with RVR, new onset with paroxysmal A-fib -Resolved and now in normal sinus rhythm -Transition Cardizem drip to oral Cardizem -CHA2DS2-VASc score 4, recommend anticoagulation.  Started on Eliquis  History of rectal cancer -3/21 rectal cancer diagnosed after seen colonoscopy.  Completed total neoadjuvant treatment with Dr. Tami Lin.  Finished radiation in 11/21.  Underwent APR on January 2022 with a diverting ileostomy.  May 2022, underwent ileostomy reversal.  Follow-up endoscopy recently showed an anastomotic polyp biopsy of which showed adenocarcinoma. -General surgery planned for permanent colostomy but the surgery had to be canceled twice.  First time was because of poor bowel prep and second time on 11/10/2021 was because of hypocalcemia.   -Per Dr. Marcello Moores, could consider a diverting ostomy in the hospital, if pt proceeding to a palliative situation -Currently abdominoperineal resection scheduled for 8/11, which pt plans to undergo   Pulmonary nodule -CT angio of chest showed slowly enlarging solid right upper lobe pulmonary nodule since last check, now 6 to 7 mm, this is more suspicious for bronchogenic carcinoma then solitary lung metastasis. -Appreciate pulmonology, planning for bronchoscopy during hospitalization  Hypertension -Cardizem  Chronic diarrhea/constipation -Questran  Demand ischemia -No symptoms of ACS -Aspirin   HLD -Crestor   AKI on CKD stage II, history of IgA nephropathy -Baseline creatinine 0.9-1 -AKI resolved   Acute metabolic encephalopathy with delirium -Resolved  Chronic L2 compression fracture -Reported several weeks of right paralumbar spinal pain worse with movement and better at rest -Lumbar spine x-ray showed chronic compression fracture deformity at the level of  L2. Moderate to marked severity multilevel degenerative disc disease  Bilateral hydroureteronephrosis -CT abdomen  noted bilateral hydroureteronephrosis secondary to urinary retention likely at the level of bladder neck.   -Foley catheter was placed, plan for voiding trial prior to discharge  GERD -PPI   DVT prophylaxis:  apixaban (ELIQUIS) tablet 5 mg  Code Status: Full code Family Communication: Friend at bedside Disposition Plan:  Status is: Inpatient Remains inpatient appropriate because: Bronchoscopy   Antimicrobials:  Anti-infectives (From admission, onward)    None        Objective: Vitals:   11/24/21 0300 11/24/21 0800 11/24/21 0822 11/24/21 0845  BP: (!) 124/55 136/70  135/77  Pulse: 86 95  90  Resp: '18 15  20  '$ Temp: 98.2 F (36.8 C)  98.6 F (37 C)   TempSrc: Oral  Oral   SpO2: 98% 98%  99%  Weight:      Height:        Intake/Output Summary (Last 24 hours) at 11/24/2021 1151 Last data filed at 11/24/2021 0914 Gross per 24 hour  Intake 586.11 ml  Output 875 ml  Net -288.89 ml   Filed Weights   11/21/21 1616 11/22/21 0553  Weight: 58.1 kg 55.9 kg    Examination:  General exam: Appears calm and comfortable, weak appearing Respiratory system: Clear to auscultation. Respiratory effort normal. No respiratory distress. No conversational dyspnea.  Cardiovascular system: S1 & S2 heard, RRR. No murmurs. No pedal edema. Gastrointestinal system: Abdomen is nondistended, soft Central nervous system: Alert and oriented.  Extremities: Symmetric in appearance  Skin: No rashes, lesions or ulcers on exposed skin  Psychiatry: Judgement and insight appear normal. Mood & affect appropriate.   Data Reviewed: I have personally reviewed following labs and imaging studies  CBC: Recent Labs  Lab 11/21/21 1719 11/22/21 0402 11/23/21 0143 11/24/21 0604  WBC 10.7* 14.1* 9.6 6.8  NEUTROABS  --   --  7.6 5.5  HGB 12.1* 11.7* 10.9* 9.9*  HCT 36.6* 34.3* 31.3* 29.1*  MCV 95.8 93.0 93.4 94.8  PLT 165 141* 106* 88*   Basic Metabolic Panel: Recent Labs  Lab 11/21/21 1719  11/21/21 2244 11/22/21 0402 11/23/21 0143 11/24/21 0604  NA 143  --  137 139 140  K 3.9  --  3.2* 3.5 3.6  CL 107  --  105 110 106  CO2 23  --  21* 20* 24  GLUCOSE 143*  --  142* 93 107*  BUN 24*  --  '18 20 18  '$ CREATININE 1.32*  --  1.09 0.98 1.02  CALCIUM 6.7*  --  6.7* 7.0* 7.5*  MG  --  <0.5* 0.9* 2.0  --   PHOS  --   --   --  2.1*  --    GFR: Estimated Creatinine Clearance: 49.5 mL/min (by C-G formula based on SCr of 1.02 mg/dL). Liver Function Tests: Recent Labs  Lab 11/21/21 1719 11/23/21 0143 11/24/21 0604  AST '24 31 27  '$ ALT '23 17 19  '$ ALKPHOS 67 58 59  BILITOT 1.1 0.8 0.7  PROT 6.1* 5.5* 5.1*  ALBUMIN 3.3* 2.9* 2.7*   No results for input(s): "LIPASE", "AMYLASE" in the last 168 hours. No results for input(s): "AMMONIA" in the last 168 hours. Coagulation Profile: No results for input(s): "INR", "PROTIME" in the last 168 hours. Cardiac Enzymes: No results for input(s): "CKTOTAL", "CKMB", "CKMBINDEX", "TROPONINI" in the last 168 hours. BNP (last 3 results) No results for input(s): "PROBNP" in the last 8760 hours. HbA1C:  No results for input(s): "HGBA1C" in the last 72 hours. CBG: No results for input(s): "GLUCAP" in the last 168 hours. Lipid Profile: No results for input(s): "CHOL", "HDL", "LDLCALC", "TRIG", "CHOLHDL", "LDLDIRECT" in the last 72 hours. Thyroid Function Tests: Recent Labs    11/23/21 0428  TSH 0.803   Anemia Panel: No results for input(s): "VITAMINB12", "FOLATE", "FERRITIN", "TIBC", "IRON", "RETICCTPCT" in the last 72 hours. Sepsis Labs: No results for input(s): "PROCALCITON", "LATICACIDVEN" in the last 168 hours.  No results found for this or any previous visit (from the past 240 hour(s)).    Radiology Studies: CT Angio Chest Pulmonary Embolism (PE) W or WO Contrast  Result Date: 11/23/2021 CLINICAL DATA:  76 year old male with abnormal D-dimer. Recent abdominal pain. History of colon cancer. Back pain. EXAM: CT ANGIOGRAPHY CHEST WITH  CONTRAST TECHNIQUE: Multidetector CT imaging of the chest was performed using the standard protocol during bolus administration of intravenous contrast. Multiplanar CT image reconstructions and MIPs were obtained to evaluate the vascular anatomy. RADIATION DOSE REDUCTION: This exam was performed according to the departmental dose-optimization program which includes automated exposure control, adjustment of the mA and/or kV according to patient size and/or use of iterative reconstruction technique. CONTRAST:  31m OMNIPAQUE IOHEXOL 350 MG/ML SOLN COMPARISON:  CT Abdomen and Pelvis 2 days ago. Restaging chest CT 06/11/2021. FINDINGS: Cardiovascular: Good contrast bolus timing in the pulmonary arterial tree. No focal filling defect identified in the pulmonary arteries to suggest acute pulmonary embolism. Prior CABG. Calcified aortic atherosclerosis. Stable cardiac size, within normal limits. No pericardial effusion. Right chest IJ approach Port-A-Cath is stable. Mediastinum/Nodes: No mediastinal mass or lymphadenopathy identified. Lungs/Pleura: Major airways are patent. Mildly lower lung volumes compared to J02-13-2024 Overall stable ventilation and left lung base scarring. No significant pleural effusion. However, a 6-7 mm right upper lobe pulmonary nodule on series 7, image 29 appears slightly larger since JFeb 13, 2024(approximally 5 mm at that time). And this was only punctate on a previous CT 10/05/2020. No other pulmonary nodule or suspicious pulmonary opacity. Upper Abdomen: Pronounced gastric wall thickening seems to be a chronic finding, very similar to the chest CT appearance in J02-13-2024 more pronounced from yesterday although the stomach is also more decompressed now. Otherwise stable visible upper abdominal viscera including cholelithiasis. No free air or free fluid in the visible upper abdomen. Musculoskeletal: Prior sternotomy. Osteopenia and thoracic spine degeneration. Chronic T3 and L2 compression fractures  appear stable from last year. Chronic right lateral rib fractures are stable. No acute osseous abnormality identified. Review of the MIP images confirms the above findings. IMPRESSION: 1. Negative for acute pulmonary embolus. 2. Slowly enlarging solid right upper lobe pulmonary nodule since last year, now 6-7 mm. This is more suspicious for Bronchogenic Carcinoma than solitary lung metastasis. [facility]. 3. No other acute finding in the chest.  Prior CABG. 4. Stable visible upper abdomen. Electronically Signed   By: HGenevie AnnM.D.   On: 11/23/2021 06:53   ECHOCARDIOGRAM COMPLETE  Result Date: 11/22/2021    ECHOCARDIOGRAM REPORT   Patient Name:   Jared White Date of Exam: 11/22/2021 Medical Rec #:  [id number]   Height:       68.0 in Accession #:    [id number]  Weight:       123.2 lb Date of Birth:  907/11/1945   BSA:          1.664 m Patient Age:    89years  BP:           140/66 mmHg Patient Gender: M            HR:           84 bpm. Exam Location:  Inpatient Procedure: 2D Echo, Color Doppler and Cardiac Doppler Indications:    Elevated trop  History:        Patient has prior history of Echocardiogram examinations, most                 recent 03/25/2020. CAD, Prior CABG; Risk Factors:Hypertension and                 HLD.  Sonographer:    Joette Catching RCS Referring Phys: [id number] [place]  1. Left ventricular ejection fraction, by estimation, is 60 to 65%. The left ventricle has normal function. The left ventricle has no regional wall motion abnormalities. There is mild left ventricular hypertrophy. Left ventricular diastolic parameters are consistent with Grade I diastolic dysfunction (impaired relaxation).  2. Right ventricular systolic function is normal. The right ventricular size is normal.  3. Left atrial size was moderately dilated.  4. The mitral valve is normal in structure. Mild mitral valve regurgitation. No evidence of  mitral stenosis.  5. The aortic valve is tricuspid. Aortic valve regurgitation is not visualized. Aortic valve sclerosis is present, with no evidence of aortic valve stenosis.  6. Aortic dilatation noted. There is borderline dilatation of the ascending aorta, measuring 39 mm.  7. The inferior vena cava is normal in size with greater than 50% respiratory variability, suggesting right atrial pressure of 3 mmHg. Comparison(s): No prior Echocardiogram. FINDINGS  Left Ventricle: Left ventricular ejection fraction, by estimation, is 60 to 65%. The left ventricle has normal function. The left ventricle has no regional wall motion abnormalities. The left ventricular internal cavity size was normal in size. There is  mild left ventricular hypertrophy. Left ventricular diastolic parameters are consistent with Grade I diastolic dysfunction (impaired relaxation). Right Ventricle: The right ventricular size is normal. Right ventricular systolic function is normal. Left Atrium: Left atrial size was moderately dilated. Right Atrium: Right atrial size was normal in size. Pericardium: There is no evidence of pericardial effusion. Mitral Valve: The mitral valve is normal in structure. Mild mitral valve regurgitation. No evidence of mitral valve stenosis. Tricuspid Valve: The tricuspid valve is normal in structure. Tricuspid valve regurgitation is mild . No evidence of tricuspid stenosis. Aortic Valve: The aortic valve is tricuspid. Aortic valve regurgitation is not visualized. Aortic valve sclerosis is present, with no evidence of aortic valve stenosis. Aortic valve mean gradient measures 5.0 mmHg. Aortic valve peak gradient measures 6.8  mmHg. Aortic valve area, by VTI measures 3.24 cm. Pulmonic Valve: The pulmonic valve was normal in structure. Pulmonic valve regurgitation is not visualized. No evidence of pulmonic stenosis. Aorta: The aortic root is normal in size and structure and aortic dilatation noted. There is borderline  dilatation of the ascending aorta, measuring 39 mm. Venous: The inferior vena cava is normal in size with greater than 50% respiratory variability, suggesting right atrial pressure of 3 mmHg. IAS/Shunts: No atrial level shunt detected by color flow Doppler.  LEFT VENTRICLE PLAX 2D LVIDd:         4.20 cm   Diastology LVIDs:         3.10 cm   LV e' medial:    7.07 cm/s LV PW:         1.20 cm  LV E/e' medial:  13.4 LV IVS:        1.10 cm   LV e' lateral:   9.25 cm/s LVOT diam:     2.10 cm   LV E/e' lateral: 10.2 LV SV:         70 LV SV Index:   42 LVOT Area:     3.46 cm  RIGHT VENTRICLE             IVC RV Basal diam:  3.20 cm     IVC diam: 1.50 cm RV Mid diam:    1.90 cm RV S prime:     23.60 cm/s TAPSE (M-mode): 1.5 cm LEFT ATRIUM             Index        RIGHT ATRIUM           Index LA diam:        4.00 cm 2.40 cm/m   RA Area:     14.90 cm LA Vol (A2C):   68.0 ml 40.87 ml/m  RA Volume:   35.40 ml  21.28 ml/m LA Vol (A4C):   65.6 ml 39.43 ml/m LA Biplane Vol: 71.2 ml 42.80 ml/m  AORTIC VALVE AV Area (Vmax):    2.74 cm AV Area (Vmean):   2.34 cm AV Area (VTI):     3.24 cm AV Vmax:           130.00 cm/s AV Vmean:          106.000 cm/s AV VTI:            0.215 m AV Peak Grad:      6.8 mmHg AV Mean Grad:      5.0 mmHg LVOT Vmax:         103.00 cm/s LVOT Vmean:        71.700 cm/s LVOT VTI:          0.201 m LVOT/AV VTI ratio: 0.93  AORTA Ao Root diam: 3.40 cm Ao Asc diam:  3.90 cm MITRAL VALVE               TRICUSPID VALVE MV Area (PHT): 4.21 cm    TR Peak grad:   38.7 mmHg MV Decel Time: 180 msec    TR Vmax:        311.00 cm/s MR Peak grad: 124.5 mmHg MR Mean grad: 94.0 mmHg    SHUNTS MR Vmax:      558.00 cm/s  Systemic VTI:  0.20 m MR Vmean:     473.0 cm/s   Systemic Diam: 2.10 cm MV E velocity: 94.70 cm/s MV A velocity: 78.40 cm/s MV E/A ratio:  1.21 Kirk Ruths MD Electronically signed by Kirk Ruths MD Signature Date/Time: 11/22/2021/2:49:08 PM    Final       Scheduled Meds:  apixaban  5 mg Oral  BID   aspirin EC  81 mg Oral Daily   calcium carbonate  400 mg of elemental calcium Oral Daily   Chlorhexidine Gluconate Cloth  6 each Topical Q0600   cholestyramine  4 g Oral BID   diltiazem  30 mg Oral Q6H   feeding supplement  1 Container Oral BID BM   feeding supplement  237 mL Oral BID BM   LORazepam  2 mg Intravenous Once   multivitamin with minerals  1 tablet Oral Daily   pantoprazole  40 mg Oral Daily   potassium chloride  40 mEq Oral Daily   rosuvastatin  20  mg Oral Daily   sodium chloride flush  10-40 mL Intracatheter Q12H   Continuous Infusions:   LOS: 2 days     Dessa Phi, DO [facility] 11/24/2021, 11:51 AM   Available via Epic secure chat 7am-7pm After these hours, please refer to coverage provider listed on [email or web address]

## 2021-11-24 NOTE — Progress Notes (Signed)
Secure chatted Choi DO at 1615 about a scant amount of red blood in stool possibly due to straining, no new orders at this time. Patient is asymptomatic, MD aware.

## 2021-11-24 NOTE — Progress Notes (Signed)
Pt is continuing to have blood in stool w/o straining and is still asymptomatic. MD paged awaiting response.

## 2021-11-25 ENCOUNTER — Other Ambulatory Visit: Payer: Self-pay

## 2021-11-25 LAB — CBC
HCT: 34.7 % — ABNORMAL LOW (ref 39.0–52.0)
Hemoglobin: 11.7 g/dL — ABNORMAL LOW (ref 13.0–17.0)
MCH: 32.1 pg (ref 26.0–34.0)
MCHC: 33.7 g/dL (ref 30.0–36.0)
MCV: 95.3 fL (ref 80.0–100.0)
Platelets: 132 10*3/uL — ABNORMAL LOW (ref 150–400)
RBC: 3.64 MIL/uL — ABNORMAL LOW (ref 4.22–5.81)
RDW: 13.1 % (ref 11.5–15.5)
WBC: 8.3 10*3/uL (ref 4.0–10.5)
nRBC: 0 % (ref 0.0–0.2)

## 2021-11-25 LAB — COMPREHENSIVE METABOLIC PANEL
ALT: 20 U/L (ref 0–44)
AST: 25 U/L (ref 15–41)
Albumin: 3.1 g/dL — ABNORMAL LOW (ref 3.5–5.0)
Alkaline Phosphatase: 62 U/L (ref 38–126)
Anion gap: 7 (ref 5–15)
BUN: 17 mg/dL (ref 8–23)
CO2: 22 mmol/L (ref 22–32)
Calcium: 8.5 mg/dL — ABNORMAL LOW (ref 8.9–10.3)
Chloride: 110 mmol/L (ref 98–111)
Creatinine, Ser: 1 mg/dL (ref 0.61–1.24)
GFR, Estimated: >60 mL/min (ref >60)
Glucose, Bld: 130 mg/dL — ABNORMAL HIGH (ref 70–99)
Potassium: 4.2 mmol/L (ref 3.5–5.1)
Sodium: 139 mmol/L (ref 135–145)
Total Bilirubin: 0.8 mg/dL (ref 0.3–1.2)
Total Protein: 6 g/dL — ABNORMAL LOW (ref 6.5–8.1)

## 2021-11-25 LAB — MAGNESIUM: Magnesium: 1.6 mg/dL — ABNORMAL LOW (ref 1.7–2.4)

## 2021-11-25 MED ORDER — DRONEDARONE HCL 400 MG PO TABS
400.0000 mg | ORAL_TABLET | Freq: Two times a day (BID) | ORAL | Status: DC
  Administered 2021-11-25 – 2021-12-01 (×12): 400 mg via ORAL
  Filled 2021-11-25 (×14): qty 1

## 2021-11-25 MED ORDER — MAGNESIUM SULFATE 2 GM/50ML IV SOLN
2.0000 g | Freq: Once | INTRAVENOUS | Status: AC
  Administered 2021-11-25: 2 g via INTRAVENOUS
  Filled 2021-11-25: qty 50

## 2021-11-25 MED ORDER — DILTIAZEM HCL-DEXTROSE 125-5 MG/125ML-% IV SOLN (PREMIX)
5.0000 mg/h | INTRAVENOUS | Status: AC
  Administered 2021-11-25: 5 mg/h via INTRAVENOUS
  Administered 2021-11-25 – 2021-11-28 (×2): 7.5 mg/h via INTRAVENOUS
  Filled 2021-11-25 (×6): qty 125

## 2021-11-25 NOTE — Progress Notes (Signed)
Physical Therapy Treatment Patient Details Name: Jared White MRN: [id number] DOB: 18-Feb-1946 Today's Date: 11/25/2021   History of Present Illness Pt is a 76 y.o. male admitted 11/21/21 with increasing weakness; workup for AKI and hypocalcemia in the setting of bladder outlet obstruction. New Afib 7/4; also with episodes of AMS. Chest CTA 7/4 with slowly enlarging RUL nodule. Awaiting bronchoscopy for RUL nodule. Of note, pt awaiting permanent colostomy sx that has been delayed. Other PMH includes rectal CA s/p radiation with diverting ileostomy s/p reversal, chronic diarrhea, hypocalcemia, CAD s/p CABG, CKD, HTN, HLD, tobacco use.   PT Comments    Pt slowly progressing with mobility; still with confusion and difficulty reasoning with importance of activity progression ("this will make me too weak for tomorrows procedure"). Pt tolerated brief bout of in-room activity with RW and min-modA for stability, mod-max verbal cues for safety. Pt remains limited by generalized weakness, decreased activity tolerance, poor balance strategies/postural reactions and impaired cognition. Continue to recommend SNF-level therapies to maximize functional mobility and independence prior to return home.    Recommendations for follow up therapy are one component of a multi-disciplinary discharge planning process, led by the attending physician.  Recommendations may be updated based on patient status, additional functional criteria and insurance authorization.  Follow Up Recommendations  Skilled nursing-short term rehab (<3 hours/day) Can patient physically be transported by private vehicle: Yes   Assistance Recommended at Discharge Frequent or constant Supervision/Assistance  Patient can return home with the following A lot of help with walking and/or transfers;A lot of help with bathing/dressing/bathroom;Assist for transportation;Direct supervision/assist for medications management;Direct supervision/assist for financial  management;Assistance with cooking/housework;Help with stairs or ramp for entrance   Equipment Recommendations  Rolling walker (2 wheels);BSC/3in1    Recommendations for Other Services       Precautions / Restrictions Precautions Precautions: Fall Restrictions Weight Bearing Restrictions: No     Mobility  Bed Mobility Overal bed mobility: Needs Assistance Bed Mobility: Supine to Sit     Supine to sit: Min assist, HOB elevated     General bed mobility comments: MinA for trunk elevation, pt able to scoot hips to EOB without assist; increased time and effort    Transfers Overall transfer level: Needs assistance Equipment used: Rolling walker (2 wheels) Transfers: Sit to/from Stand Sit to Stand: Min assist           General transfer comment: prolonged time with pt attempting to pull on RW to stand unsuccessfully, cues for hand placement and pt able to stand with minA for trunk elevation and stability    Ambulation/Gait Ambulation/Gait assistance: Min assist, Mod assist Gait Distance (Feet): 20 Feet Assistive device: Rolling walker (2 wheels) Gait Pattern/deviations: Step-to pattern, Step-through pattern, Decreased stride length, Shuffle, Trunk flexed, Leaning posteriorly Gait velocity: Decreased Gait velocity interpretation: <1.31 ft/sec, indicative of household ambulator   General Gait Details: slow, unsteady gait with RW and 3x min-modA to prevent LOB; pt walking to chair then leaving walker behind attempting to walk back to other side of bed despite agreeing to sit in recliner, max verbal cues for safety and sequencing; external assist for safe RW management   Stairs             Wheelchair Mobility    Modified Rankin (Stroke Patients Only)       Balance Overall balance assessment: Needs assistance Sitting-balance support: No upper extremity supported, Feet supported Sitting balance-Leahy Scale: Fair     Standing balance support: No upper  extremity supported,  Bilateral upper extremity supported, During functional activity Standing balance-Leahy Scale: Poor Standing balance comment: reliant on external assist to maintain balance when pt letting go of RW                            Cognition Arousal/Alertness: Awake/alert Behavior During Therapy: WFL for tasks assessed/performed, Flat affect Overall Cognitive Status: Impaired/Different from baseline Area of Impairment: Orientation, Attention, Memory, Following commands, Safety/judgement                   Current Attention Level: Selective Memory: Decreased short-term memory Following Commands: Follows one step commands with increased time Safety/Judgement: Decreased awareness of safety, Decreased awareness of deficits     General Comments: pt adamant that he has not "exercised" since admission, that he did not walk 100' with physical therapy last session; later admits he has had moments of forgetfulness; difficulty reasoning with pt regarding importance of mobility progression ("I'm too weak to do this... this will make me too weak for my procedure tomorrow..."        Exercises      General Comments General comments (skin integrity, edema, etc.): pt's friend present and supportive, friend notes pt's increased confusion. pt declines additional mobility despite max encouragement; endorses fatigue having recently gotten to bathroom with nursing after bowel incontinence      Pertinent Vitals/Pain Pain Assessment Pain Assessment: Faces Faces Pain Scale: Hurts little more Pain Location: "my rectum" Pain Descriptors / Indicators: Discomfort Pain Intervention(s): Monitored during session, Limited activity within patient's tolerance, Repositioned    Home Living                          Prior Function            PT Goals (current goals can now be found in the care plan section) Progress towards PT goals: Progressing toward goals     Frequency    Min 3X/week      PT Plan Current plan remains appropriate    Co-evaluation              AM-PAC PT "6 Clicks" Mobility   Outcome Measure  Help needed turning from your back to your side while in a flat bed without using bedrails?: A Little Help needed moving from lying on your back to sitting on the side of a flat bed without using bedrails?: A Little Help needed moving to and from a bed to a chair (including a wheelchair)?: A Lot Help needed standing up from a chair using your arms (e.g., wheelchair or bedside chair)?: A Lot Help needed to walk in hospital room?: A Lot Help needed climbing 3-5 steps with a railing? : Total 6 Click Score: 13    End of Session Equipment Utilized During Treatment: Gait belt Activity Tolerance: Patient limited by fatigue Patient left: in chair;with call bell/phone within reach;with family/visitor present;with chair alarm set Nurse Communication: Mobility status PT Visit Diagnosis: Other abnormalities of gait and mobility (R26.89);Difficulty in walking, not elsewhere classified (R26.2);Muscle weakness (generalized) (M62.81)     Time: 8546-2703 PT Time Calculation (min) (ACUTE ONLY): 16 min  Charges:  $Therapeutic Activity: 8-22 mins                     Mabeline Caras, PT, DPT Acute Rehabilitation Services  Personal: [place] [facility] Office: [place] 11/25/2021, 12:19 PM

## 2021-11-25 NOTE — Plan of Care (Signed)
  Problem: Clinical Measurements: Goal: Respiratory complications will improve Outcome: Progressing   Problem: Elimination: Goal: Will not experience complications related to bowel motility Outcome: Progressing Goal: Will not experience complications related to urinary retention Outcome: Progressing   Problem: Pain Managment: Goal: General experience of comfort will improve Outcome: Progressing   Problem: Clinical Measurements: Goal: Cardiovascular complication will be avoided Outcome: Not Progressing   Problem: Nutrition: Goal: Adequate nutrition will be maintained Outcome: Not Progressing   Problem: Coping: Goal: Level of anxiety will decrease Outcome: Not Progressing

## 2021-11-25 NOTE — Care Management Important Message (Signed)
Important Message  Patient Details  Name: Jared White MRN: [id number] Date of Birth: 1945/08/26   Medicare Important Message Given:  Yes     Orbie Pyo 11/25/2021, 3:09 PM

## 2021-11-25 NOTE — Progress Notes (Addendum)
PROGRESS NOTE    Jared White  VOJ:[id number] DOB: Jan 30, 1946 DOA: 11/21/2021 PCP: Caprice Red, MD     Brief Narrative:  Jared White is a 76 y.o. male with PMH significant for HTN, HLD, CAD/stent/CABG, CHF, GERD, IgA nephropathy, CKD, rectal cancer status post radiation, neoadjuvant treatment, diverting ileostomy s/p reversal with recent anastomotic recurrence, chronic diarrhea, hypocalcemia.    Patient presented to the ED on 7/2 with complaint of constant nausea for the past 2 days leading to poor oral intake, increased weakness, impaired gait, decreased urine output. He has chronic diarrhea due to colon cancer, has low appetite and was only able to tolerate liquid diet.  He is on chronic calcium and magnesium supplementation.  For the past 2 to 3 weeks, patient has also noted right paralumbar pain worse with movement and better at rest.    In the ED, patient was afebrile, hemodynamically stable. Labs showed low serum calcium and magnesium level. He was noted to have urinary retention of more than 470 ml.  Foley catheter was inserted. Admitted to hospital service for further evaluation management. Had episode of A-fib RVR on morning of 7/4, had extreme restlessness and agitation with altered mental status.  New events last 24 hours / Subjective: Yesterday, he had a couple of episodes of bloody bowel movements.  Patient reports that the amount of blood is more than what he is used to seeing.  He has been having intermittent bloody stools due to his history of colon cancer but has worsened in setting of Eliquis and aspirin use.  Overnight, he went back into A-fib RVR with rate as high as 180.  He was restarted on Cardizem drip.  This morning, patient is alert, appropriate, has no physical complaints.  He has converted to sinus tachycardia with rate 110.  Assessment & Plan:   Principal Problem:   Hypocalcemia Active Problems:   CAD (coronary artery disease)   Essential hypertension    S/P CABG x 4   Hypomagnesemia   Rectal cancer (HCC)   Acute renal failure superimposed on stage 3b chronic kidney disease (HCC)   Hydroureteronephrosis   Lumbar back pain   Elevated troponin   Protein-calorie malnutrition, severe   Acute on chronic hypocalcemia, hypomagnesemia -Has chronic low calcium and magnesium levels due to chronic diarrhea -Continue to monitor calcium and magnesium levels -Replace magnesium today  A-fib with RVR, new onset with paroxysmal A-fib -Initially converted to normal sinus rhythm on Cardizem drip and subsequently switched to oral Cardizem.  Converted back into A-fib RVR and resumed back on Cardizem drip.  Now in sinus tachycardia.  Cardiology Dr. Bonney Roussel team consulted. -CHA2DS2-VASc score 4, recommend anticoagulation.  Started on Eliquis, but this was discontinued in setting of worsening bloody bowel movements  History of rectal cancer -3/21 rectal cancer diagnosed after seen colonoscopy.  Completed total neoadjuvant treatment with Dr. Tami Lin.  Finished radiation in 11/21.  Underwent APR on January 2022 with a diverting ileostomy.  May 2022, underwent ileostomy reversal.  Follow-up endoscopy recently showed an anastomotic polyp biopsy of which showed adenocarcinoma. -General surgery planned for permanent colostomy but the surgery had to be canceled twice.  First time was because of poor bowel prep and second time on 11/10/2021 was because of hypocalcemia.   -Per Dr. Marcello Moores, could consider a diverting ostomy in the hospital, if pt proceeding to a palliative situation -Currently abdominoperineal resection scheduled for 8/11, which pt plans to undergo  -Bloody bowel movement.  Repeat hemoglobin is stable at  11.7.  Pulmonary nodule -CT angio of chest showed slowly enlarging solid right upper lobe pulmonary nodule since last check, now 6 to 7 mm, this is more suspicious for bronchogenic carcinoma then solitary lung metastasis. -Appreciate pulmonology,  planning for bronchoscopy during hospitalization. Awaiting to hear from schedule availability.  Hypertension -Cardizem  Chronic diarrhea/constipation -Questran  Demand ischemia -No symptoms of ACS -Aspirin on hold due to bloody bowel movement  HLD -Crestor   AKI on CKD stage II, history of IgA nephropathy -Baseline creatinine 0.9-1 -AKI resolved   Acute metabolic encephalopathy with delirium -Resolved  Chronic L2 compression fracture -Reported several weeks of right paralumbar spinal pain worse with movement and better at rest -Lumbar spine x-ray showed chronic compression fracture deformity at the level of L2. Moderate to marked severity multilevel degenerative disc disease  Bilateral hydroureteronephrosis -CT abdomen noted bilateral hydroureteronephrosis secondary to urinary retention likely at the level of bladder neck.   -Foley catheter was placed, plan for voiding trial prior to discharge  GERD -PPI   DVT prophylaxis:   Eliquis on hold.  SCDs.  Code Status: Full code Family Communication: Friend as well as brother-in-law at bedside Disposition Plan:  Status is: Inpatient Remains inpatient appropriate because: Bronchoscopy pending, cardiology consulted   Antimicrobials:  Anti-infectives (From admission, onward)    None        Objective: Vitals:   11/25/21 0254 11/25/21 0305 11/25/21 0435 11/25/21 0830  BP:  (!) 162/78 132/77 130/78  Pulse:  (!) 135  (!) 101  Resp:  (!) '24 19 19  '$ Temp: 98.1 F (36.7 C) 98.3 F (36.8 C) 98.5 F (36.9 C) 97.6 F (36.4 C)  TempSrc: Oral Oral Oral Oral  SpO2:  96%  96%  Weight:      Height:        Intake/Output Summary (Last 24 hours) at 11/25/2021 1214 Last data filed at 11/25/2021 0624 Gross per 24 hour  Intake 1029.27 ml  Output 1750 ml  Net -720.73 ml    Filed Weights   11/21/21 1616 11/22/21 0553  Weight: 58.1 kg 55.9 kg    Examination:  General exam: Appears calm and comfortable, weak  appearing Respiratory system: Clear to auscultation. Respiratory effort normal. No respiratory distress. No conversational dyspnea.  On room air Cardiovascular system: S1 & S2 heard, tachycardic, regular rhythm. No murmurs. No pedal edema. Gastrointestinal system: Abdomen is nondistended, soft Central nervous system: Alert and oriented.  Extremities: Symmetric in appearance  Skin: No rashes, lesions or ulcers on exposed skin  Psychiatry: Judgement and insight appear normal. Mood & affect appropriate.   Data Reviewed: I have personally reviewed following labs and imaging studies  CBC: Recent Labs  Lab 11/21/21 1719 11/22/21 0402 11/23/21 0143 11/24/21 0604 11/25/21 0850  WBC 10.7* 14.1* 9.6 6.8 8.3  NEUTROABS  --   --  7.6 5.5  --   HGB 12.1* 11.7* 10.9* 9.9* 11.7*  HCT 36.6* 34.3* 31.3* 29.1* 34.7*  MCV 95.8 93.0 93.4 94.8 95.3  PLT 165 141* 106* 88* 132*    Basic Metabolic Panel: Recent Labs  Lab 11/21/21 1719 11/21/21 2244 11/22/21 0402 11/23/21 0143 11/24/21 0604 11/25/21 0423  NA 143  --  137 139 140 139  K 3.9  --  3.2* 3.5 3.6 4.2  CL 107  --  105 110 106 110  CO2 23  --  21* 20* 24 22  GLUCOSE 143*  --  142* 93 107* 130*  BUN 24*  --  18 20  18 17  CREATININE 1.32*  --  1.09 0.98 1.02 1.00  CALCIUM 6.7*  --  6.7* 7.0* 7.5* 8.5*  MG  --  <0.5* 0.9* 2.0  --  1.6*  PHOS  --   --   --  2.1*  --   --     GFR: Estimated Creatinine Clearance: 50.5 mL/min (by C-G formula based on SCr of 1 mg/dL). Liver Function Tests: Recent Labs  Lab 11/21/21 1719 11/23/21 0143 11/24/21 0604 11/25/21 0423  AST '24 31 27 25  '$ ALT '23 17 19 20  '$ ALKPHOS 67 58 59 62  BILITOT 1.1 0.8 0.7 0.8  PROT 6.1* 5.5* 5.1* 6.0*  ALBUMIN 3.3* 2.9* 2.7* 3.1*    No results for input(s): "LIPASE", "AMYLASE" in the last 168 hours. No results for input(s): "AMMONIA" in the last 168 hours. Coagulation Profile: No results for input(s): "INR", "PROTIME" in the last 168 hours. Cardiac  Enzymes: No results for input(s): "CKTOTAL", "CKMB", "CKMBINDEX", "TROPONINI" in the last 168 hours. BNP (last 3 results) No results for input(s): "PROBNP" in the last 8760 hours. HbA1C: No results for input(s): "HGBA1C" in the last 72 hours. CBG: No results for input(s): "GLUCAP" in the last 168 hours. Lipid Profile: No results for input(s): "CHOL", "HDL", "LDLCALC", "TRIG", "CHOLHDL", "LDLDIRECT" in the last 72 hours. Thyroid Function Tests: Recent Labs    11/23/21 0428  TSH 0.803    Anemia Panel: No results for input(s): "VITAMINB12", "FOLATE", "FERRITIN", "TIBC", "IRON", "RETICCTPCT" in the last 72 hours. Sepsis Labs: No results for input(s): "PROCALCITON", "LATICACIDVEN" in the last 168 hours.  No results found for this or any previous visit (from the past 240 hour(s)).    Radiology Studies: No results found.    Scheduled Meds:  calcium carbonate  400 mg of elemental calcium Oral Daily   Chlorhexidine Gluconate Cloth  6 each Topical Q0600   cholestyramine  4 g Oral BID   feeding supplement  1 Container Oral BID BM   feeding supplement  237 mL Oral BID BM   LORazepam  2 mg Intravenous Once   multivitamin with minerals  1 tablet Oral Daily   pantoprazole  40 mg Oral Daily   potassium chloride  40 mEq Oral Daily   rosuvastatin  20 mg Oral Daily   sodium chloride flush  10-40 mL Intracatheter Q12H   Continuous Infusions:  diltiazem (CARDIZEM) infusion 7.5 mg/hr (11/25/21 0624)     LOS: 3 days     Dessa Phi, DO [facility] 11/25/2021, 12:14 PM   Available via Epic secure chat 7am-7pm After these hours, please refer to coverage provider listed on [email or web address]

## 2021-11-25 NOTE — Plan of Care (Signed)
Discussed with patient plan of care for the evening, pain management and medications with some evidence of teach back.  Patient's brother-in-law in the room at the time since the patient is very forgetful.  Problem: Education: Goal: Knowledge of General Education information will improve Description: Including pain rating scale, medication(s)/side effects and non-pharmacologic comfort measures Outcome: Progressing

## 2021-11-25 NOTE — Consult Note (Signed)
CARDIOLOGY CONSULT NOTE  Patient ID: Jared White MRN: [id number] DOB/AGE: 08/02/1945 76 y.o.  Admit date: 11/21/2021 Referring Physician: [facility] Reason for Consultation:  Afib  HPI:   76 year old male with coronary artery disease status post CABG 2001 by Dr. Prescott Gum (LIMA-LAD, SVG-Diag, seg SVG-ramus & OM, SVG-PDA), hypertension, CKD III w/IgA nephropathy, hyperlipidemia, rectal mass s/p radiation, neoadjuvant treatment, diverting ileostomy s/p reversal with recent anastomotic recurrence, upcoming colostomy, chronic diarrhea, admitted with generalized weakness, decreased urine output, nausea, diarrhea. Cardiology consulted for management of new onset Afib.  Patient was recently seen by me in office, where I had deemed him at acceptable cardiac risk for  upcoming planned colostomy in August. Patient got admitted with generalized weakness, decreased urine putput, nausea, diarrhea. Workup was significant for Mg <0.5, K 3.2, CT abdomen showing bilateral hydrouteronephrosis 2/2 urinary retention. He was also found to have a newly increased in size pulmonary nodule. He was seen by PCCM and recommended navigational bronchoscopy given his strong family ho lung cancer.  In the meantime, patient developed Afib w/RVR and was also found to have elevated troponin. He denies any chest pain. Shortness of breath symptoms. He did have bloody bowel movement after starting eliquis. Eliquis was thus stopped. Hb was down to 9.9. currently at 11.7.   Past Medical History:  Diagnosis Date   Arthritis    Blood transfusion without reported diagnosis    Broken back    CAD (coronary artery disease)    PCI & CABG   Cataract    "LIGHT"-RIGHT   CHF (congestive heart failure) ([place])    Diverticulitis    mild - approx 2004   Dysrhythmia    occasional arrythmias   Family history of heart disease    GERD (gastroesophageal reflux disease)    Heart murmur    HX OF YEARS AGO    History of kidney stones     History of nuclear stress test 06/30/2011   lexiscan; normal pattern of perfusion post-stress; low risk scan    Hyperlipidemia    Hypertension    IgA nephropathy    Irregular heart beat    Myocardial infarction ([place])    Pre-diabetes    Rectal cancer ([place]) 08/19/2019   Systemic hypertension      Past Surgical History:  Procedure Laterality Date   CARDIAC CATHETERIZATION  08/22/1995   PTCA of OM (Dr. Marella Chimes)   COLONOSCOPY     CORONARY ARTERY BYPASS GRAFT  01/22/2000   x5 - LIMA to LAD, SVG to diagonal, sequential SVG to ramus & OM, SVG to PDA (Dr. Tharon Aquas Trigt)   DIVERTING ILEOSTOMY N/A 06/10/2020   Procedure: DIVERTING LOOP ILEOSTOMY;  Surgeon: Leighton Ruff, MD;  Location: [facility];  Service: General;  Laterality: N/A;   ILEOSTOMY CLOSURE N/A 10/09/2020   Procedure: LOOP ILEOSTOMY REVERSAL;  Surgeon: Leighton Ruff, MD;  Location: [facility];  Service: General;  Laterality: N/A;   IR IMAGING GUIDED PORT INSERTION  09/04/2019   LUNG LOBECTOMY     left upper lobe   RECTAL BIOPSY N/A 07/09/2021   Procedure: BIOPSY RECTAL;  Surgeon: Leighton Ruff, MD;  Location: [facility];  Service: General;  Laterality: N/A;   RECTAL EXAM UNDER ANESTHESIA N/A 07/09/2021   Procedure: ANAL RECTAL EXAM UNDER ANESTHESIA, RIGID PROCTOSCOPY;  Surgeon: Leighton Ruff, MD;  Location: [facility];  Service: General;  Laterality: N/A;   TRANSTHORACIC ECHOCARDIOGRAM  12/24/2012   EF 55-60%, mild LVH, mild conc hypertrophy; mild MR; LA mildly dilated  XI ROBOTIC ASSISTED LOWER ANTERIOR RESECTION N/A 06/10/2020   Procedure: XI ROBOTIC ASSISTED LOWER ANTERIOR RESECTION, MOBILIZATION OF SPLENIC FLEXURE, RIGID PROCTOSCOPY;  Surgeon: Leighton Ruff, MD;  Location: [facility];  Service: General;  Laterality: N/A;      Family History  Problem Relation Age of Onset   Heart attack Father    Kidney failure Brother    Prostate cancer Brother    Heart attack Brother    Heart disease Brother    Hypertension Brother    Heart  disease Brother    Hypertension Brother    Colon cancer Neg Hx    Esophageal cancer Neg Hx    Rectal cancer Neg Hx    Stomach cancer Neg Hx    Inflammatory bowel disease Neg Hx    Liver disease Neg Hx    Pancreatic cancer Neg Hx    Colon polyps Neg Hx      Social History: Social History   Socioeconomic History   Marital status: Single    Spouse name: n/a   Number of children: 0   Years of education: Not on file   Highest education level: Not on file  Occupational History   Occupation: Chief Strategy Officer, [employer] representative  Tobacco Use   Smoking status: Never   Smokeless tobacco: Never  Vaping Use   Vaping Use: Never used  Substance and Sexual Activity   Alcohol use: Never   Drug use: No   Sexual activity: Not on file  Other Topics Concern   Not on file  Social History Narrative   Lives alone.   Sister-in-Law lives next door.   Social Determinants of Health   Financial Resource Strain: Low Risk  (01/22/2020)   Overall Financial Resource Strain (CARDIA)    Difficulty of Paying Living Expenses: Not hard at all  Food Insecurity: No Food Insecurity (01/22/2020)   Hunger Vital Sign    Worried About Running Out of Food in the Last Year: Never true    Ran Out of Food in the Last Year: Never true  Transportation Needs: No Transportation Needs (01/22/2020)   PRAPARE - Hydrologist (Medical): No    Lack of Transportation (Non-Medical): No  Physical Activity: Not on file  Stress: No Stress Concern Present (01/22/2020)   [place]    Feeling of Stress : Only a little  Social Connections: Socially Isolated (01/22/2020)   Social Connection and Isolation Panel [NHANES]    Frequency of Communication with Friends and Family: More than three times a week    Frequency of Social Gatherings with Friends and Family: More than three times a week    Attends Religious Services: Never    Corporate treasurer or Organizations: No    Attends Archivist Meetings: Never    Marital Status: Never married  Human resources officer Violence: Not on file     Medications Prior to Admission  Medication Sig Dispense Refill Last Dose   AMLODIPINE BESYLATE PO Take 1 tablet by mouth 2 (two) times daily.   11/21/2021   Ascorbic Acid (VITAMIN C PO) Take 1 tablet by mouth daily.   11/21/2021   aspirin EC 81 MG tablet Take 1 tablet (81 mg total) by mouth daily. Swallow whole. (Patient taking differently: Take 162 mg by mouth daily.) 90 tablet 3 11/21/2021   atenolol (TENORMIN) 50 MG tablet TAKE 1 TABLET BY MOUTH EVERY DAY (Patient taking differently: Take 50 mg by  mouth daily.) 90 tablet 3 11/21/2021 at 08:00   b complex vitamins capsule Take 1 capsule by mouth daily.   11/21/2021   CALCIUM PO Take 1 tablet by mouth daily.   11/21/2021   Cholecalciferol (VITAMIN D-3 PO) Take 1 capsule by mouth daily.   11/21/2021   MAGNESIUM PO Take 1 tablet by mouth daily.   11/21/2021   nitroGLYCERIN (NITROSTAT) 0.4 MG SL tablet Place 0.4 mg under the tongue every 5 (five) minutes x 3 doses as needed for chest pain.   NEVER   omeprazole (PRILOSEC) 40 MG capsule TAKE 1 CAPSULE (40 MG TOTAL) BY MOUTH DAILY. 30 capsule 3 11/21/2021   rosuvastatin (CRESTOR) 20 MG tablet TAKE 1 TABLET BY MOUTH EVERY DAY (Patient taking differently: Take 20 mg by mouth daily.) 90 tablet 3 11/21/2021   valsartan (DIOVAN) 160 MG tablet TAKE 1 TABLET BY MOUTH EVERY DAY (Patient taking differently: Take 160 mg by mouth daily.) 90 tablet 3 11/21/2021   VITAMIN A PO Take 1 tablet by mouth daily.   11/21/2021   VITAMIN E PO Take 1 capsule by mouth daily at 6 (six) AM.   11/21/2021    Review of Systems  Constitutional: Positive for malaise/fatigue.  Cardiovascular:  Negative for chest pain, dyspnea on exertion, leg swelling, palpitations and syncope.  Gastrointestinal:  Positive for diarrhea and nausea.  Genitourinary:  Positive for incomplete emptying.      Physical  Exam: Physical Exam Vitals and nursing note reviewed.  Constitutional:      General: He is not in acute distress. Neck:     Vascular: No JVD.  Cardiovascular:     Rate and Rhythm: Regular rhythm. Tachycardia present.     Heart sounds: Normal heart sounds. No murmur heard. Pulmonary:     Effort: Pulmonary effort is normal.     Breath sounds: Normal breath sounds. No wheezing or rales.  Musculoskeletal:     Right lower leg: No edema.     Left lower leg: No edema.        Imaging/tests reviewed and independently interpreted: Lab Results: CBC, BMP, trop HS  Cardiac Studies:  Telemetry 11/25/2021: Initial atrial tachycardia, progressing into Afib w/RVR Now in sinus tachcyardia  EKG 11/25/2021: Afib with RVR 135 bpm Nonspecific ST-T abnormality  Echocardiogram 11/22/2021:  1. Left ventricular ejection fraction, by estimation, is 60 to 65%. The  left ventricle has normal function. The left ventricle has no regional  wall motion abnormalities. There is mild left ventricular hypertrophy.  Left ventricular diastolic parameters  are consistent with Grade I diastolic dysfunction (impaired relaxation).   2. Right ventricular systolic function is normal. The right ventricular  size is normal.   3. Left atrial size was moderately dilated.   4. The mitral valve is normal in structure. Mild mitral valve  regurgitation. No evidence of mitral stenosis.   5. The aortic valve is tricuspid. Aortic valve regurgitation is not  visualized. Aortic valve sclerosis is present, with no evidence of aortic  valve stenosis.   6. Aortic dilatation noted. There is borderline dilatation of the  ascending aorta, measuring 39 mm.   7. The inferior vena cava is normal in size with greater than 50%  respiratory variability, suggesting right atrial pressure of 3 mmHg.   Comparison(s): No prior Echocardiogram.   Assessment & Recommendations:   76 year old male with coronary artery disease status post CABG  2001 by Dr. Prescott Gum (LIMA-LAD, SVG-Diag, seg SVG-ramus & OM, SVG-PDA), hypertension, CKD III w/IgA nephropathy,  hyperlipidemia, rectal mass s/p radiation, neoadjuvant treatment, diverting ileostomy s/p reversal with recent anastomotic recurrence, upcoming colostomy, now with new onset PAF< increasing in size pulmonary nodule, troponin elevation in the setting severe dehydration, dyselectrolytemia   PAF: Rhythm nay have started at atrial tachycardia, but progressed to Afib w/RVR. Patient self converted to sinus rhythm, but remains in sinus tachycardia. I think his sinus tachycardia is likely due to volume depletion. Afib itself, may have been triggered due to severe dyselectrolytemia, including Mg <0.5, K 3.2. However, he has had recurrent Afib even after electrolyte correction. With upcoming possible colostomy and navigational bronchoscopy, my goal would be to try and keep him in sinus rhythm. Therefore I recommend rhythm therapy, at least for short term. Given his CAD, I recommend Multaq 400 mg bid. CHA2DS2VASc score at least 5, annual stroke risk >7%. However, anticoagulation will be limited due to recent bloody bowel movement on initiation of eliquis, ongoing colon pathology, and risk of bleeding. Even LAA closure is likely not possible, given that even that would require at least short term DAPT. Patient and I discussed risk of bleeding and risk of stroke. Unfortunately, we may have to accept risk of stroke, given risk of bleeding is likely Administrator, arts.   Troponin elevation: No chest pain or acute ischemic changes. Likely type 2 MI in the setting of bleeding, Afib RVR, dehydration. Unable to use Aspirin or heparin due to bleeding.  Cardiac risk stratification: While I had deemed his cardiac risk to be acceptable a few weeks ago, new Afib, troponin elevation change the perioperative cardiac risk to elevated. Fortunately, there are no symptoms of ishcemia, EF is normal on echocardiogram. While the risk  is elevated, I do not think it will be mitigated by ischemia testing at this time. The reason being his inability to use antiplatelet therapy in the face of bleeding. Also, perioperative revascularization does not necessarily reduce perioperative cardiac risk.   I am cautiously optimistic that his CAD may not be critically obstructive, given that he has not had any symptoms of ischemia during Afib w/RVR episodes. Given both the bronchoscopy and colostomy are likely necessary surgeries, we may have to proceed accepting elevated periperative cardiac risk.   Discussed interpretation of tests and management recommendations with the primary team     Nigel Mormon, MD Pager: [phone] Office: [phone]

## 2021-11-25 NOTE — Progress Notes (Signed)
Pt has new onset sxs of confusion, and anxiety.HR is 147-180's, BP 162/78 (103). MD aware. MD ordered STAT EKG and awaiting results to start Cardizem gtt.

## 2021-11-25 NOTE — Progress Notes (Signed)
Overnight event  Patient back into A-fib with RVR with rate as high as 180s. Blood pressure 155/119, temperature 98.1 F, SPO2 96% on room air. He is receiving oral Cardizem 30 mg every 6 hours, last dose at 2346.  -Discontinue oral Cardizem -Start Cardizem drip and monitor very closely -Stat CBC ordered earlier tonight still pending -Repeat labs to check electrolytes

## 2021-11-26 DIAGNOSIS — I480 Paroxysmal atrial fibrillation: Secondary | ICD-10-CM

## 2021-11-26 LAB — BASIC METABOLIC PANEL
Anion gap: 10 (ref 5–15)
BUN: 26 mg/dL — ABNORMAL HIGH (ref 8–23)
CO2: 20 mmol/L — ABNORMAL LOW (ref 22–32)
Calcium: 9.1 mg/dL (ref 8.9–10.3)
Chloride: 107 mmol/L (ref 98–111)
Creatinine, Ser: 1.25 mg/dL — ABNORMAL HIGH (ref 0.61–1.24)
GFR, Estimated: >60 mL/min (ref >60)
Glucose, Bld: 155 mg/dL — ABNORMAL HIGH (ref 70–99)
Potassium: 4.5 mmol/L (ref 3.5–5.1)
Sodium: 137 mmol/L (ref 135–145)

## 2021-11-26 LAB — CBC
HCT: 33.8 % — ABNORMAL LOW (ref 39.0–52.0)
Hemoglobin: 11.2 g/dL — ABNORMAL LOW (ref 13.0–17.0)
MCH: 32.1 pg (ref 26.0–34.0)
MCHC: 33.1 g/dL (ref 30.0–36.0)
MCV: 96.8 fL (ref 80.0–100.0)
Platelets: 139 10*3/uL — ABNORMAL LOW (ref 150–400)
RBC: 3.49 MIL/uL — ABNORMAL LOW (ref 4.22–5.81)
RDW: 13.5 % (ref 11.5–15.5)
WBC: 10.2 10*3/uL (ref 4.0–10.5)
nRBC: 0 % (ref 0.0–0.2)

## 2021-11-26 LAB — MAGNESIUM: Magnesium: 1.7 mg/dL (ref 1.7–2.4)

## 2021-11-26 MED ORDER — HALOPERIDOL LACTATE 5 MG/ML IJ SOLN
5.0000 mg | Freq: Four times a day (QID) | INTRAMUSCULAR | Status: DC | PRN
  Administered 2021-11-26 – 2021-11-28 (×7): 5 mg via INTRAVENOUS
  Filled 2021-11-26 (×7): qty 1

## 2021-11-26 MED ORDER — SODIUM CHLORIDE 0.9 % IV SOLN: INTRAVENOUS | Status: DC

## 2021-11-26 MED ORDER — QUETIAPINE FUMARATE 50 MG PO TABS
50.0000 mg | ORAL_TABLET | Freq: Every day | ORAL | Status: DC
  Administered 2021-11-26 – 2021-11-30 (×5): 50 mg via ORAL
  Filled 2021-11-26 (×6): qty 1

## 2021-11-26 MED ORDER — LORAZEPAM 2 MG/ML IJ SOLN: 1.0000 mg | INTRAMUSCULAR | Status: DC | PRN

## 2021-11-26 MED ORDER — SODIUM CHLORIDE 0.9 % IV BOLUS
500.0000 mL | Freq: Once | INTRAVENOUS | Status: AC
Start: 2021-11-26 — End: 2021-11-26
  Administered 2021-11-26: 500 mL via INTRAVENOUS

## 2021-11-26 MED ORDER — HALOPERIDOL LACTATE 5 MG/ML IJ SOLN: 5.0000 mg | Freq: Four times a day (QID) | INTRAMUSCULAR | Status: DC | PRN

## 2021-11-26 MED ORDER — MAGNESIUM SULFATE 2 GM/50ML IV SOLN
2.0000 g | Freq: Once | INTRAVENOUS | Status: AC
  Administered 2021-11-26: 2 g via INTRAVENOUS
  Filled 2021-11-26: qty 50

## 2021-11-26 MED ORDER — HALOPERIDOL LACTATE 5 MG/ML IJ SOLN
5.0000 mg | Freq: Four times a day (QID) | INTRAMUSCULAR | Status: DC | PRN
Start: 2021-11-26 — End: 2021-12-01

## 2021-11-26 NOTE — TOC Progression Note (Signed)
Transition of Care [facility]) - Progression Note    Patient Details  Name: Jared White MRN: [id number] Date of Birth: 01-Feb-1946  Transition of Care [facility]) CM/SW Contact  Reece Agar, [place] Phone Number: 11/26/2021, 11:00 AM  Clinical Narrative:    CSW will review SNF offers with pt and brother in law.   [facility] Preferred SNF  Accepted N/A [address], [place] [address] [phone] --  [facility] Preferred SNF  Accepted N/A [address], [place] [address] [phone] --  [facility] Preferred SNF  Accepted N/A [address], [place] [address] [phone] --  [facility] SN[facility]  Accepted N/A [address], [place] [address] [phone] --  [facility] SNF  Accepted N/A [address], [place] [address] [phone] --  [facility] Preferred SNF  Accepted N/A [address], [place] [address] [phone] --  [facility] Preferred SNF  Accepted N/A [address], [place] [address] [phone] --     Expected Discharge Plan: [place] Barriers to Discharge: Continued Medical Work up  Expected Discharge Plan and Services Expected Discharge Plan: [place] In-house Referral: Clinical Social Work   Post Acute Care Choice: [place] Living arrangements for the past 2 months: Single Family Home                                       Social Determinants of Health (SDOH) Interventions    Readmission Risk Interventions    08/26/2020    1:33 PM  Readmission Risk Prevention Plan  Transportation Screening Complete  PCP or Specialist Appt within 3-5 Days Complete  HRI or [place] Complete  Social Work Consult for [place] Planning/Counseling Complete  Palliative Care Screening  Not Applicable  Medication Review Press photographer) Complete
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
## 2021-11-26 NOTE — Progress Notes (Addendum)
Subjective:   Says "I'm dying. Please take me to the hospital". Specifically "[facility]" Wants to talk to his cardiologist Dr Rollene Fare, whom he knows but has not seen as his cardiologist in many years  Very confused and agitated overnight Remains tachycardic  Objective:  Vital Signs in the last 24 hours: Temp:  [97.8 F (36.6 C)-98.4 F (36.9 C)] 98.4 F (36.9 C) (07/07 0400) Pulse Rate:  [94-131] 120 (07/07 0830) Resp:  [16-29] 23 (07/07 0830) BP: (121-166)/(57-118) 166/57 (07/07 0830) SpO2:  [90 %-100 %] 97 % (07/07 0830)  Intake/Output from previous day: 07/06 0701 - 07/07 0700 In: 600.7 [P.O.:420; I.V.:180.7] Out: 625 [Urine:625]  Physical Exam Vitals and nursing note reviewed.  Constitutional:      General: He is not in acute distress.    Appearance: He is ill-appearing.  Neck:     Vascular: No JVD.  Cardiovascular:     Rate and Rhythm: Regular rhythm. Tachycardia present.     Heart sounds: Normal heart sounds. No murmur heard. Pulmonary:     Effort: Pulmonary effort is normal.     Breath sounds: Normal breath sounds. No wheezing or rales.  Musculoskeletal:     Right lower leg: No edema.     Left lower leg: No edema.      Imaging/tests reviewed and independently interpreted: CBC, BMP, trop HS  Cardiac Studies:  Telemetry 11/25/2021: Initial atrial tachycardia, progressing into Afib w/RVR Now in sinus tachcyardia   EKG 11/25/2021: Afib with RVR 135 bpm Nonspecific ST-T abnormality   Echocardiogram 11/22/2021:  1. Left ventricular ejection fraction, by estimation, is 60 to 65%. The  left ventricle has normal function. The left ventricle has no regional  wall motion abnormalities. There is mild left ventricular hypertrophy.  Left ventricular diastolic parameters  are consistent with Grade I diastolic dysfunction (impaired relaxation).   2. Right ventricular systolic function is normal. The right ventricular  size is normal.   3. Left atrial  size was moderately dilated.   4. The mitral valve is normal in structure. Mild mitral valve  regurgitation. No evidence of mitral stenosis.   5. The aortic valve is tricuspid. Aortic valve regurgitation is not  visualized. Aortic valve sclerosis is present, with no evidence of aortic  valve stenosis.   6. Aortic dilatation noted. There is borderline dilatation of the  ascending aorta, measuring 39 mm.   7. The inferior vena cava is normal in size with greater than 50%  respiratory variability, suggesting right atrial pressure of 3 mmHg.    Assessment & Recommendations:  76 year old male with coronary artery disease status post CABG 2001 by Dr. Prescott Gum (LIMA-LAD, SVG-Diag, seg SVG-ramus & OM, SVG-PDA), hypertension, CKD III w/IgA nephropathy, hyperlipidemia, rectal mass s/p radiation, neoadjuvant treatment, diverting ileostomy s/p reversal with recent anastomotic recurrence, upcoming colostomy, now with new onset PAF< increasing in size pulmonary nodule, troponin elevation in the setting severe dehydration, dyselectrolytemia, now with confusion  Confusion: Hospital delirium vs metabolic encephalopathy I am concerned if there would be an underlying cause such as sepsis or bleeding.   PAF: Rhythm may have started at atrial tachycardia, but progressed to Afib w/RVR. Patient self converted to sinus rhythm, but remains in sinus tachycardia. I think his sinus tachycardia is likely due to volume depletion. Afib itself, may have been triggered due to severe dyselectrolytemia, including Mg <0.5, K 3.2. However, he has had recurrent Afib even after electrolyte correction. With upcoming possible colostomy and navigational bronchoscopy, my goal would be  to try and keep him in sinus rhythm. Therefore I recommend rhythm therapy, at least for short term. Given his CAD, I recommended Multaq 400 mg bid. CHA2DS2VASc score at least 5, annual stroke risk >7%. However, anticoagulation will be limited due to  recent bloody bowel movement on initiation of eliquis, ongoing colon pathology, and risk of bleeding. Even LAA closure is likely not possible, given that even that would require at least short term DAPT. Patient and I discussed risk of bleeding and risk of stroke. Unfortunately, we may have to accept risk of stroke, given risk of bleeding is likely Administrator, arts.   Overnight 7/6-7/7, patient has remains in sinus tachycardia, coinciding with extreme confusion, agitation, which could be driving it. At the same time, I am also concerned about underlying cause such as sepsis or bleeding.  This definitely affects his cardiac risk stratification. Until etiology of the above is clear, I recommend holding off any surgical procedure or bronchoscopy.   Troponin elevation: No chest pain or acute ischemic changes. Likely type 2 MI in the setting of bleeding, Afib RVR, dehydration. Unable to use Aspirin or heparin due to bleeding.  Rest of the management as per the primary team    Discussed interpretation of tests and management recommendations with the primary team  My partner Dr Terri Skains is rounding over the weekend. He will peripherally follow the patient, unless any acute cardiac decompensation were to occur.    Nigel Mormon, MD Pager: [phone] Office: [phone]

## 2021-11-26 NOTE — Progress Notes (Signed)
PROGRESS NOTE    Jared White  GEX:[id number] DOB: [birth year] DOA: 11/21/2021 PCP: Caprice Red, MD     Brief Narrative:  Jared White is a 76 y.o. male with PMH significant for HTN, HLD, CAD/stent/CABG, CHF, GERD, IgA nephropathy, CKD, rectal cancer status post radiation, neoadjuvant treatment, diverting ileostomy s/p reversal with recent anastomotic recurrence, chronic diarrhea, hypocalcemia.    Patient presented to the ED on 7/2 with complaint of constant nausea for the past 2 days leading to poor oral intake, increased weakness, impaired gait, decreased urine output. He has chronic diarrhea due to colon cancer, has low appetite and was only able to tolerate liquid diet.  He is on chronic calcium and magnesium supplementation.  For the past 2 to 3 weeks, patient has also noted right paralumbar pain worse with movement and better at rest.    In the ED, patient was afebrile, hemodynamically stable. Labs showed low serum calcium and magnesium level. He was noted to have urinary retention of more than 470 ml.  Foley catheter was inserted. Admitted to hospital service for further evaluation management.  Had episode of A-fib RVR on morning of 7/4, had extreme restlessness and agitation with altered mental status.  He was started on Cardizem drip.  He converted to normal sinus rhythm and was switched to oral Cardizem.  Went back into A-fib RVR and resumed on Cardizem drip.  Cardiology was consulted, started on Multaq.   Has also been having episodes of bloody bowel movements in setting of Eliquis use.  Eliquis is currently on hold.  Hemoglobin remains stable.  New events last 24 hours / Subjective: Overnight, became very agitated, combative, confused.  Fell on the floor.  His heart rate remained elevated on IV Cardizem.  This morning, patient voices no physical complaints, but remains very confused and agitated.  He has not been allowing nursing to do nursing care or administer  medications.  Spoke with brother-in-law outside of patient's room.  Brother-in-law is patient's only remaining relative.  Has some distant nieces and nephews who are uninvolved.  Brother-in-law lives in [place], but is in town to assist with patient's pending surgery.  He states that ever since patient's cancer treatment, patient has had decline in mentation and cognition.  Suspect some underlying dementia.  Assessment & Plan:   Principal Problem:   Hypocalcemia Active Problems:   CAD (coronary artery disease)   Essential hypertension   S/P CABG x 4   Hypomagnesemia   Rectal cancer (HCC)   Acute renal failure superimposed on stage 3b chronic kidney disease (HCC)   Hydroureteronephrosis   Lumbar back pain   Elevated troponin   Protein-calorie malnutrition, severe   Acute on chronic hypocalcemia, hypomagnesemia -Has chronic low calcium and magnesium levels due to chronic diarrhea -Continue to monitor calcium and magnesium levels -Replace magnesium  A-fib with RVR, new onset with paroxysmal A-fib -Initially converted to normal sinus rhythm on Cardizem drip and subsequently switched to oral Cardizem.  Converted back into A-fib RVR and resumed back on Cardizem drip.  Now in sinus tachycardia.  Cardiology Dr. Bonney Roussel team consulted. -CHA2DS2-VASc score 4, recommend anticoagulation.  Started on Eliquis, but this was discontinued in setting of worsening bloody bowel movements -Remains on Cardizem drip, Multaq  Delirium and sundowning in setting of undiagnosed dementia -Haldol as needed, Seroquel nightly -Delirium precaution  History of rectal cancer -3/21 rectal cancer diagnosed after seen colonoscopy.  Completed total neoadjuvant treatment with Dr. Tami Lin.  Finished radiation in 11/21.  Underwent  APR on January 2022 with a diverting ileostomy.  May 2022, underwent ileostomy reversal.  Follow-up endoscopy recently showed an anastomotic polyp biopsy of which showed  adenocarcinoma. -General surgery planned for permanent colostomy but the surgery had to be canceled twice.  First time was because of poor bowel prep and second time on 11/10/2021 was because of hypocalcemia.   -Per Dr. Marcello Moores, could consider a diverting ostomy in the hospital, if pt proceeding to a palliative situation -Currently abdominoperineal resection scheduled for 8/11 -Bloody bowel movement.  Repeat hemoglobin is stable   Pulmonary nodule -CT angio of chest showed slowly enlarging solid right upper lobe pulmonary nodule since last check, now 6 to 7 mm, this is more suspicious for bronchogenic carcinoma then solitary lung metastasis. -Appreciate pulmonology, planning for bronchoscopy during hospitalization.  They are working on schedule availability  Hypertension -Cardizem  Chronic diarrhea/constipation -Questran  Demand ischemia -No symptoms of ACS -Aspirin on hold due to bloody bowel movement  HLD -Crestor   AKI on CKD stage II, history of IgA nephropathy -Baseline creatinine 0.9-1 -AKI resolved, continue to monitor  Chronic L2 compression fracture -Reported several weeks of right paralumbar spinal pain worse with movement and better at rest -Lumbar spine x-ray showed chronic compression fracture deformity at the level of L2. Moderate to marked severity multilevel degenerative disc disease  Bilateral hydroureteronephrosis -CT abdomen noted bilateral hydroureteronephrosis secondary to urinary retention likely at the level of bladder neck.   -Foley catheter was placed, plan for voiding trial prior to discharge  GERD -PPI   DVT prophylaxis: Place and maintain sequential compression device Start: 11/25/21 1228  Eliquis on hold.  SCDs.  Code Status: Full code Family Communication: Brother in Sports coach Disposition Plan:  Status is: Inpatient Remains inpatient appropriate because: Unstable with elevated heart rate.  Eventual bronchoscopy and discharge to  SNF   Antimicrobials:  Anti-infectives (From admission, onward)    None        Objective: Vitals:   11/26/21 0600 11/26/21 0630 11/26/21 0700 11/26/21 0830  BP:  (!) 138/96 (!) 150/77 (!) 166/57  Pulse: (!) 110  (!) 115 (!) 120  Resp: (!) '27 18 19 '$ (!) 23  Temp:      TempSrc:      SpO2: 97%  96% 97%  Weight:      Height:        Intake/Output Summary (Last 24 hours) at 11/26/2021 1145 Last data filed at 11/26/2021 0649 Gross per 24 hour  Intake 600.72 ml  Output 625 ml  Net -24.28 ml    Filed Weights   11/21/21 1616 11/22/21 0553  Weight: 58.1 kg 55.9 kg    Examination:  General exam: Appears agitated and restless Respiratory system: Clear to auscultation. Respiratory effort normal. No respiratory distress. No conversational dyspnea.  On room air Cardiovascular system: S1 & S2 heard, tachycardic, regular rhythm.  Sinus tachycardia on telemetry Gastrointestinal system: Abdomen is nondistended, soft Central nervous system: Alert  Extremities: Symmetric in appearance  Skin: No rashes, lesions or ulcers on exposed skin  Psychiatry: Judgement and insight appear poor, confused  Data Reviewed: I have personally reviewed following labs and imaging studies  CBC: Recent Labs  Lab 11/22/21 0402 11/23/21 0143 11/24/21 0604 11/25/21 0850 11/26/21 0424  WBC 14.1* 9.6 6.8 8.3 10.2  NEUTROABS  --  7.6 5.5  --   --   HGB 11.7* 10.9* 9.9* 11.7* 11.2*  HCT 34.3* 31.3* 29.1* 34.7* 33.8*  MCV 93.0 93.4 94.8 95.3 96.8  PLT  141* 106* 88* 132* 139*    Basic Metabolic Panel: Recent Labs  Lab 11/21/21 2244 11/22/21 0402 11/23/21 0143 11/24/21 0604 11/25/21 0423 11/26/21 0424  NA  --  137 139 140 139 137  K  --  3.2* 3.5 3.6 4.2 4.5  CL  --  105 110 106 110 107  CO2  --  21* 20* 24 22 20*  GLUCOSE  --  142* 93 107* 130* 155*  BUN  --  '18 20 18 17 '$ 26*  CREATININE  --  1.09 0.98 1.02 1.00 1.25*  CALCIUM  --  6.7* 7.0* 7.5* 8.5* 9.1  MG <0.5* 0.9* 2.0  --  1.6* 1.7   PHOS  --   --  2.1*  --   --   --     GFR: Estimated Creatinine Clearance: 40.4 mL/min (A) (by C-G formula based on SCr of 1.25 mg/dL (H)). Liver Function Tests: Recent Labs  Lab 11/21/21 1719 11/23/21 0143 11/24/21 0604 11/25/21 0423  AST '24 31 27 25  '$ ALT '23 17 19 20  '$ ALKPHOS 67 58 59 62  BILITOT 1.1 0.8 0.7 0.8  PROT 6.1* 5.5* 5.1* 6.0*  ALBUMIN 3.3* 2.9* 2.7* 3.1*    No results for input(s): "LIPASE", "AMYLASE" in the last 168 hours. No results for input(s): "AMMONIA" in the last 168 hours. Coagulation Profile: No results for input(s): "INR", "PROTIME" in the last 168 hours. Cardiac Enzymes: No results for input(s): "CKTOTAL", "CKMB", "CKMBINDEX", "TROPONINI" in the last 168 hours. BNP (last 3 results) No results for input(s): "PROBNP" in the last 8760 hours. HbA1C: No results for input(s): "HGBA1C" in the last 72 hours. CBG: No results for input(s): "GLUCAP" in the last 168 hours. Lipid Profile: No results for input(s): "CHOL", "HDL", "LDLCALC", "TRIG", "CHOLHDL", "LDLDIRECT" in the last 72 hours. Thyroid Function Tests: No results for input(s): "TSH", "T4TOTAL", "FREET4", "T3FREE", "THYROIDAB" in the last 72 hours.  Anemia Panel: No results for input(s): "VITAMINB12", "FOLATE", "FERRITIN", "TIBC", "IRON", "RETICCTPCT" in the last 72 hours. Sepsis Labs: No results for input(s): "PROCALCITON", "LATICACIDVEN" in the last 168 hours.  No results found for this or any previous visit (from the past 240 hour(s)).    Radiology Studies: No results found.    Scheduled Meds:  calcium carbonate  400 mg of elemental calcium Oral Daily   Chlorhexidine Gluconate Cloth  6 each Topical Q0600   cholestyramine  4 g Oral BID   dronedarone  400 mg Oral BID WC   feeding supplement  1 Container Oral BID BM   feeding supplement  237 mL Oral BID BM   multivitamin with minerals  1 tablet Oral Daily   pantoprazole  40 mg Oral Daily   QUEtiapine  50 mg Oral QHS   rosuvastatin   20 mg Oral Daily   sodium chloride flush  10-40 mL Intracatheter Q12H   Continuous Infusions:  sodium chloride 100 mL/hr at 11/26/21 1130   diltiazem (CARDIZEM) infusion 12.5 mg/hr (11/26/21 1130)     LOS: 4 days     Dessa Phi, DO [facility] 11/26/2021, 11:45 AM   Available via Epic secure chat 7am-7pm After these hours, please refer to coverage provider listed on [email or web address]

## 2021-11-26 NOTE — Progress Notes (Signed)
Patient refused to take all medications despite being reassured they are safe to take and the purpose of them. He told a caller to make note of the time because he was being given medication that would kill him. He is also saying he needs a ride back to his room because this is not the room he was in.

## 2021-11-26 NOTE — TOC Progression Note (Signed)
Transition of Care [facility]) - Progression Note    Patient Details  Name: Jared White MRN: [id number] Date of Birth: [birth year]  Transition of Care [facility]) CM/SW [place], RN Phone Number:[phone]  11/26/2021, 9:54 AM  Clinical Narrative:    [facility] acknowledges consult for SNF. Social worker to follow for SNF workup.    Expected Discharge Plan: [place] Barriers to Discharge: Continued Medical Work up  Expected Discharge Plan and Services Expected Discharge Plan: [place] In-house Referral: Clinical Social Work   Post Acute Care Choice: [place] Living arrangements for the past 2 months: [place] Determinants of Health (SDOH) Interventions    Readmission Risk Interventions    08/26/2020    1:33 PM  Readmission Risk Prevention Plan  Transportation Screening Complete  PCP or Specialist Appt within 3-5 Days Complete  HRI or [place] Complete  Social Work Consult for [place] Planning/Counseling Complete  Palliative Care Screening Not Applicable  Medication Review Press photographer) Complete
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
## 2021-11-27 LAB — CBC
HCT: 28.5 % — ABNORMAL LOW (ref 39.0–52.0)
Hemoglobin: 9.2 g/dL — ABNORMAL LOW (ref 13.0–17.0)
MCH: 31.5 pg (ref 26.0–34.0)
MCHC: 32.3 g/dL (ref 30.0–36.0)
MCV: 97.6 fL (ref 80.0–100.0)
Platelets: 102 10*3/uL — ABNORMAL LOW (ref 150–400)
RBC: 2.92 MIL/uL — ABNORMAL LOW (ref 4.22–5.81)
RDW: 13.6 % (ref 11.5–15.5)
WBC: 8 10*3/uL (ref 4.0–10.5)
nRBC: 0 % (ref 0.0–0.2)

## 2021-11-27 LAB — BASIC METABOLIC PANEL
Anion gap: 5 (ref 5–15)
BUN: 21 mg/dL (ref 8–23)
CO2: 21 mmol/L — ABNORMAL LOW (ref 22–32)
Calcium: 8.2 mg/dL — ABNORMAL LOW (ref 8.9–10.3)
Chloride: 112 mmol/L — ABNORMAL HIGH (ref 98–111)
Creatinine, Ser: 1.12 mg/dL (ref 0.61–1.24)
GFR, Estimated: >60 mL/min (ref >60)
Glucose, Bld: 97 mg/dL (ref 70–99)
Potassium: 4.5 mmol/L (ref 3.5–5.1)
Sodium: 138 mmol/L (ref 135–145)

## 2021-11-27 LAB — MAGNESIUM: Magnesium: 2 mg/dL (ref 1.7–2.4)

## 2021-11-27 MED ORDER — CALCIUM GLUCONATE-NACL 2-0.675 GM/100ML-% IV SOLN
2.0000 g | Freq: Once | INTRAVENOUS | Status: AC
Start: 2021-11-27 — End: 2021-11-27
  Administered 2021-11-27: 2000 mg via INTRAVENOUS
  Filled 2021-11-27: qty 100

## 2021-11-27 NOTE — Progress Notes (Signed)
PROGRESS NOTE    Jared White  ZOX:[id number] DOB: September 03, 1945 DOA: 11/21/2021 PCP: Caprice Red, MD   Brief Narrative:  Jared White is a 76 y.o. male with PMH significant for HTN, HLD, CAD/stent/CABG, CHF, GERD, IgA nephropathy, CKD, rectal cancer status post radiation, neoadjuvant treatment, diverting ileostomy s/p reversal with recent anastomotic recurrence, chronic diarrhea, hypocalcemia.    Patient presented to the ED on 7/2 with complaint of constant nausea for the past 2 days leading to poor oral intake, increased weakness, impaired gait, decreased urine output. He has chronic diarrhea due to colon cancer, has low appetite and was only able to tolerate liquid diet.  He is on chronic calcium and magnesium supplementation.  For the past 2 to 3 weeks, patient has also noted right paralumbar pain worse with movement and better at rest.    In the ED, patient was afebrile, hemodynamically stable. Labs showed low serum calcium and magnesium level. He was noted to have urinary retention of more than 470 ml.  Foley catheter was inserted. Admitted to hospital service for further evaluation management.   Had episode of A-fib RVR on morning of 7/4, had extreme restlessness and agitation with altered mental status.  He was started on Cardizem drip.  He converted to normal sinus rhythm and was switched to oral Cardizem.  Went back into A-fib RVR and resumed on Cardizem drip.  Cardiology was consulted, started on Multaq.    Has also been having episodes of bloody bowel movements in setting of Eliquis use.  Eliquis is currently on hold.  Hemoglobin remains stable.    Assessment & Plan:   Principal Problem:   Hypocalcemia Active Problems:   CAD (coronary artery disease)   Essential hypertension   S/P CABG x 4   Hypomagnesemia   Rectal cancer (HCC)   Acute renal failure superimposed on stage 3b chronic kidney disease (HCC)   Hydroureteronephrosis   Lumbar back pain   Elevated troponin    Protein-calorie malnutrition, severe   PAF (paroxysmal atrial fibrillation) (HCC)  Acute on chronic hypocalcemia, hypomagnesemia -Has chronic low calcium and magnesium levels due to chronic diarrhea -Continue to monitor calcium and magnesium levels and replace accordingly.  A-fib with RVR, new onset with paroxysmal A-fib -Initially converted to normal sinus rhythm on Cardizem drip and subsequently switched to oral Cardizem.  Converted back into A-fib RVR and resumed back on Cardizem drip.  Now in sinus tachycardia.  Cardiology Dr. Bonney Roussel team consulted. -CHA2DS2-VASc score 4, recommend anticoagulation.  Started on Eliquis, but this was discontinued in setting of worsening bloody bowel movements -Remains on Cardizem drip, Multaq.  Management per cardiology.   Delirium and sundowning in setting of undiagnosed dementia Patient fully alert and oriented during my evaluation.  Daughter-in-law at the bedside says that he typically has sundowning/confusion around 11 PM at night.  Now that he has been started on Seroquel, I think he is getting better.  We will continue that as well as Haldol as needed. 1. Avoid benzodiazepines, antihistamines, anticholinergics, and minimize opiate use as these may worsen delirium. 2: Assess, prevent and manage pain as lack of treatment can result in delirium.  3: Provide appropriate lighting and clear signage; a clock and calendar should be easily visible to the patient. 4: Monitor environmental factors. Reduce light and noise at night (close shades, turn off lights, turn off TV, ect). Correct any alterations in sleep cycle. 5: Reorient the patient to person, place, time and situation on each encounter.  6: Correct  sensory deficits if possible (replace eye glasses, hearing aids, ect). 7: Avoid restraints if able. Severely delirious patients benefit from constant observation by a sitter.   History of rectal cancer -3/21 rectal cancer diagnosed after seen  colonoscopy.  Completed total neoadjuvant treatment with Dr. Tami Lin.  Finished radiation in 11/21.  Underwent APR on January 2022 with a diverting ileostomy.  May 2022, underwent ileostomy reversal.  Follow-up endoscopy recently showed an anastomotic polyp biopsy of which showed adenocarcinoma. -General surgery planned for permanent colostomy but the surgery had to be canceled twice.  First time was because of poor bowel prep and second time on 11/10/2021 was because of hypocalcemia.   -Per Dr. Marcello Moores, could consider a diverting ostomy in the hospital, if pt proceeding to a palliative situation -Currently abdominoperineal resection scheduled for 8/11 -Bloody bowel movement.  Repeat hemoglobin is stable    Pulmonary nodule -CT angio of chest showed slowly enlarging solid right upper lobe pulmonary nodule since last check, now 6 to 7 mm, this is more suspicious for bronchogenic carcinoma then solitary lung metastasis. -Appreciate pulmonology, planning for bronchoscopy during hospitalization.  They are working on schedule availability   Hypertension -Controlled, Cardizem   Chronic diarrhea/constipation -Questran   Demand ischemia -No symptoms of ACS -Aspirin on hold due to bloody bowel movement   HLD -Crestor    AKI on CKD stage II, history of IgA nephropathy -Baseline creatinine 0.9-1 -AKI resolved, continue to monitor   Chronic L2 compression fracture -Reported several weeks of right paralumbar spinal pain worse with movement and better at rest -Lumbar spine x-ray showed chronic compression fracture deformity at the level of L2. Moderate to marked severity multilevel degenerative disc disease   Bilateral hydroureteronephrosis -CT abdomen noted bilateral hydroureteronephrosis secondary to urinary retention likely at the level of bladder neck.   -Foley catheter was placed, plan for voiding trial prior to discharge   GERD -PPI  Anemia of chronic disease: Hemoglobin stable.  Chronic  thrombocytopenia: Platelets stable.  No bleeding.   DVT prophylaxis: Place and maintain sequential compression device Start: 11/25/21 1228, Eliquis   Code Status: Full Code  Family Communication: Brother-in-law at bedside.  Plan of care discussed.  Status is: Inpatient Remains inpatient appropriate because: On Cardizem drip.   Estimated body mass index is 18.74 kg/m as calculated from the following:   Height as of this encounter: '5\' 8"'$  (1.727 m).   Weight as of this encounter: 55.9 kg.    Nutritional Assessment: Body mass index is 18.74 kg/m.Marland Kitchen Seen by dietician.  I agree with the assessment and plan as outlined below: Nutrition Status: Nutrition Problem: Severe Malnutrition Etiology: chronic illness (colon cancer) Signs/Symptoms: moderate fat depletion, severe fat depletion, severe muscle depletion, moderate muscle depletion Interventions: Boost Breeze, Magic cup, MVI  . Skin Assessment: I have examined the patient's skin and I agree with the wound assessment as performed by the wound care RN as outlined below:    Consultants:  Cardiology  Procedures:  None  Antimicrobials:  Anti-infectives (From admission, onward)    None         Subjective: Seen and examined.  Below at the bedside.  Patient fully alert and oriented.  He complains of weakness.  No other complaint.  Objective: Vitals:   11/27/21 0000 11/27/21 0300 11/27/21 0334 11/27/21 0715  BP: (!) 111/54 (!) 100/52 (!) 100/52 (!) 116/57  Pulse:   100 83  Resp: '16 14 20 12  '$ Temp:   97.8 F (36.6 C) 97.9 F (36.6  C)  TempSrc:   Oral Oral  SpO2:   98% 97%  Weight:      Height:        Intake/Output Summary (Last 24 hours) at 11/27/2021 0939 Last data filed at 11/27/2021 0600 Gross per 24 hour  Intake 1457.64 ml  Output 1600 ml  Net -142.36 ml   Filed Weights   11/21/21 1616 11/22/21 0553  Weight: 58.1 kg 55.9 kg    Examination:  General exam: Appears calm and comfortable  Respiratory system:  Clear to auscultation. Respiratory effort normal. Cardiovascular system: S1 & S2 heard, RRR. No JVD, murmurs, rubs, gallops or clicks. No pedal edema. Gastrointestinal system: Abdomen is nondistended, soft and nontender. No organomegaly or masses felt. Normal bowel sounds heard. Central nervous system: Alert and oriented. No focal neurological deficits. Extremities: Symmetric 5 x 5 power. Skin: No rashes, lesions or ulcers Psychiatry: Judgement and insight appear poor   Data Reviewed: I have personally reviewed following labs and imaging studies  CBC: Recent Labs  Lab 11/23/21 0143 11/24/21 0604 11/25/21 0850 11/26/21 0424 11/27/21 0354  WBC 9.6 6.8 8.3 10.2 8.0  NEUTROABS 7.6 5.5  --   --   --   HGB 10.9* 9.9* 11.7* 11.2* 9.2*  HCT 31.3* 29.1* 34.7* 33.8* 28.5*  MCV 93.4 94.8 95.3 96.8 97.6  PLT 106* 88* 132* 139* 748*   Basic Metabolic Panel: Recent Labs  Lab 11/22/21 0402 11/23/21 0143 11/24/21 0604 11/25/21 0423 11/26/21 0424 11/27/21 0354  NA 137 139 140 139 137 138  K 3.2* 3.5 3.6 4.2 4.5 4.5  CL 105 110 106 110 107 112*  CO2 21* 20* 24 22 20* 21*  GLUCOSE 142* 93 107* 130* 155* 97  BUN '18 20 18 17 '$ 26* 21  CREATININE 1.09 0.98 1.02 1.00 1.25* 1.12  CALCIUM 6.7* 7.0* 7.5* 8.5* 9.1 8.2*  MG 0.9* 2.0  --  1.6* 1.7 2.0  PHOS  --  2.1*  --   --   --   --    GFR: Estimated Creatinine Clearance: 45.1 mL/min (by C-G formula based on SCr of 1.12 mg/dL). Liver Function Tests: Recent Labs  Lab 11/21/21 1719 11/23/21 0143 11/24/21 0604 11/25/21 0423  AST '24 31 27 25  '$ ALT '23 17 19 20  '$ ALKPHOS 67 58 59 62  BILITOT 1.1 0.8 0.7 0.8  PROT 6.1* 5.5* 5.1* 6.0*  ALBUMIN 3.3* 2.9* 2.7* 3.1*   No results for input(s): "LIPASE", "AMYLASE" in the last 168 hours. No results for input(s): "AMMONIA" in the last 168 hours. Coagulation Profile: No results for input(s): "INR", "PROTIME" in the last 168 hours. Cardiac Enzymes: No results for input(s): "CKTOTAL", "CKMB",  "CKMBINDEX", "TROPONINI" in the last 168 hours. BNP (last 3 results) No results for input(s): "PROBNP" in the last 8760 hours. HbA1C: No results for input(s): "HGBA1C" in the last 72 hours. CBG: No results for input(s): "GLUCAP" in the last 168 hours. Lipid Profile: No results for input(s): "CHOL", "HDL", "LDLCALC", "TRIG", "CHOLHDL", "LDLDIRECT" in the last 72 hours. Thyroid Function Tests: No results for input(s): "TSH", "T4TOTAL", "FREET4", "T3FREE", "THYROIDAB" in the last 72 hours. Anemia Panel: No results for input(s): "VITAMINB12", "FOLATE", "FERRITIN", "TIBC", "IRON", "RETICCTPCT" in the last 72 hours. Sepsis Labs: No results for input(s): "PROCALCITON", "LATICACIDVEN" in the last 168 hours.  No results found for this or any previous visit (from the past 240 hour(s)).   Radiology Studies: No results found.  Scheduled Meds:  calcium carbonate  400 mg of elemental  calcium Oral Daily   Chlorhexidine Gluconate Cloth  6 each Topical Q0600   cholestyramine  4 g Oral BID   dronedarone  400 mg Oral BID WC   feeding supplement  1 Container Oral BID BM   feeding supplement  237 mL Oral BID BM   multivitamin with minerals  1 tablet Oral Daily   pantoprazole  40 mg Oral Daily   QUEtiapine  50 mg Oral QHS   rosuvastatin  20 mg Oral Daily   sodium chloride flush  10-40 mL Intracatheter Q12H   Continuous Infusions:  sodium chloride 100 mL/hr at 11/26/21 2335   diltiazem (CARDIZEM) infusion 7.5 mg/hr (11/26/21 2332)     LOS: 5 days   Darliss Cheney, MD [facility]  11/27/2021, 9:39 AM   *Please note that this is a verbal dictation therefore any spelling or grammatical errors are due to the "[place] [employer]" system interpretation.  Please page via [place] and do not message via secure chat for urgent patient care matters. Secure chat can be used for non urgent patient care matters.  How to contact the [facility] Attending or Consulting provider [place] or covering provider  during after hours [place], for this patient?  Check the care team in [facility] and look for a) attending/consulting [facility] provider listed and b) the [facility] team listed. Page or secure chat [phone]. Log into [email or web address] and use [place]'s universal password to access. If you do not have the password, please contact the hospital operator. Locate the [facility] provider you are looking for under [facility] and page to a number that you can be directly reached. If you still have difficulty reaching the provider, please page the [facility] (Director on Call) for the Hospitalists listed on amion for assistance.

## 2021-11-27 NOTE — Plan of Care (Signed)
  Problem: Clinical Measurements: Goal: Ability to maintain clinical measurements within normal limits will improve Outcome: Progressing Goal: Will remain free from infection Outcome: Progressing Goal: Respiratory complications will improve Outcome: Progressing Goal: Cardiovascular complication will be avoided Outcome: Progressing   Problem: Education: Goal: Knowledge of General Education information will improve Description: Including pain rating scale, medication(s)/side effects and non-pharmacologic comfort measures Outcome: Not Progressing   Problem: Health Behavior/Discharge Planning: Goal: Ability to manage health-related needs will improve Outcome: Not Progressing

## 2021-11-27 NOTE — TOC Progression Note (Addendum)
Transition of Care [facility]) - Progression Note    Patient Details  Name: Jared White MRN: [id number] Date of Birth: [birth year]  Transition of [facility] CM/SW Contact  Lorri Frederick, LCSW Phone Number: 11/27/2021, 1:47 PM  Clinical Narrative:   CSW spoke with pt and friend Morris in room, presented bed offers.  Pt oriented x1 per chart but was able to participate appropriately in the conversation.  They will discuss.  CSW spoke with pt brother in law [name] by phone and also presented bed offers--he would like pt to accept offer at [facility] due to [place] living close to there.  CSW spoke with pt again but pt stating he thinks he wants to be closer to his home in [place].  CSW spoke with [place] again and Morris, they are all going to discuss this evening at the hospital and will make decision.     Expected Discharge Plan: Skilled Nursing Facility Barriers to Discharge: Continued Medical Work up  Expected Discharge Plan and Services Expected Discharge Plan: Skilled Nursing Facility In-house Referral: Clinical Social Work   Post Acute Care Choice: Skilled Nursing Facility Living arrangements for the past 2 months: Single Family Home                                       Social Determinants of Health (SDOH) Interventions    Readmission Risk Interventions    08/26/2020    1:33 PM  Readmission Risk Prevention Plan  Transportation Screening Complete  PCP or Specialist Appt within 3-5 Days Complete  HRI or Home Care Consult Complete  Social Work Consult for Recovery Care Planning/Counseling Complete  Palliative Care Screening Not Applicable  Medication Review [employer]) Complete

## 2021-11-28 DIAGNOSIS — R911 Solitary pulmonary nodule: Secondary | ICD-10-CM | POA: Diagnosis not present

## 2021-11-28 DIAGNOSIS — C189 Malignant neoplasm of colon, unspecified: Secondary | ICD-10-CM | POA: Diagnosis not present

## 2021-11-28 LAB — TYPE AND SCREEN
ABO/RH(D): A POS
Antibody Screen: NEGATIVE

## 2021-11-28 LAB — BASIC METABOLIC PANEL
Anion gap: 7 (ref 5–15)
BUN: 16 mg/dL (ref 8–23)
CO2: 22 mmol/L (ref 22–32)
Calcium: 8.5 mg/dL — ABNORMAL LOW (ref 8.9–10.3)
Chloride: 108 mmol/L (ref 98–111)
Creatinine, Ser: 1.15 mg/dL (ref 0.61–1.24)
GFR, Estimated: >60 mL/min (ref >60)
Glucose, Bld: 100 mg/dL — ABNORMAL HIGH (ref 70–99)
Potassium: 3.8 mmol/L (ref 3.5–5.1)
Sodium: 137 mmol/L (ref 135–145)

## 2021-11-28 LAB — MAGNESIUM: Magnesium: 1.6 mg/dL — ABNORMAL LOW (ref 1.7–2.4)

## 2021-11-28 MED ORDER — CALCIUM GLUCONATE 10 % IV SOLN
4.0000 g | Freq: Once | INTRAVENOUS | Status: AC
  Administered 2021-11-28: 4 g via INTRAVENOUS
  Filled 2021-11-28: qty 40

## 2021-11-28 MED ORDER — HEPARIN (PORCINE) 25000 UT/250ML-% IV SOLN
650.0000 [IU]/h | INTRAVENOUS | Status: DC
  Administered 2021-11-28: 750 [IU]/h via INTRAVENOUS
  Administered 2021-11-30: 650 [IU]/h via INTRAVENOUS
  Filled 2021-11-28 (×2): qty 250

## 2021-11-28 MED ORDER — MAGNESIUM SULFATE 2 GM/50ML IV SOLN
2.0000 g | Freq: Once | INTRAVENOUS | Status: AC
  Administered 2021-11-28: 2 g via INTRAVENOUS
  Filled 2021-11-28: qty 50

## 2021-11-28 MED ORDER — DILTIAZEM HCL 60 MG PO TABS
60.0000 mg | ORAL_TABLET | Freq: Three times a day (TID) | ORAL | Status: DC
  Administered 2021-11-28 – 2021-12-01 (×10): 60 mg via ORAL
  Filled 2021-11-28 (×10): qty 1

## 2021-11-28 NOTE — Progress Notes (Signed)
   NAME:  Jared White, MRN:  [id number], DOB:  [birth year], LOS: 6 ADMISSION DATE:  11/21/2021, CONSULTATION DATE:  7/4 REFERRING MD:  Dahal, CHIEF COMPLAINT:  Weakness   History of Present Illness:  76 y/o male with a complex cancer history presented with AKI and hypokalemia in the setting of bladder outlet obstruction.  A CT scan of his chest was performed and showed that a RUL nodule had increased in size from 9m to 6-767mbetween January 2023 and now.  PCCM was consulted for the same.  He is a life long non-smoker  Pertinent  Medical History  Rectal Cancer - 07/2019; s/p diverting colostomy (and later reversal), radiation, neo-adjuvant treatment; 2023 surveillance colonoscopy showed anastomotic recurrence GERD Coronary Artery Disease s/p PCI and CABG Hypertension  Significant Hospital Events: Including procedures, antibiotic start and stop dates in addition to other pertinent events   7/2 admission 7/4 CT angiogram chest > neg for PE, slowly enlarging RUL nodule previously 11m60mow 6-7 mm  Interim History / Subjective:   Elderly gentleman resting in bed no distress. Would prefer to move forward with biopsy rather than having a follow-up CT scan in 3 months.  Objective   Blood pressure 120/61, pulse 81, temperature 98.5 F (36.9 C), temperature source Oral, resp. rate 19, height '5\' 8"'$  (1.727 m), weight 55.9 kg, SpO2 97 %.        Intake/Output Summary (Last 24 hours) at 11/28/2021 1546 Last data filed at 11/28/2021 1218 Gross per 24 hour  Intake --  Output 2800 ml  Net -2800 ml   Filed Weights   11/21/21 1616 11/22/21 0553  Weight: 58.1 kg 55.9 kg    Examination:  General: Chronically ill-appearing elderly gentleman resting in bed HENT: NCAT, tracking appropriately PULM: Clear to auscultation bilaterally no crackles no wheeze CV: Regular rhythm, S1-S2 GI: Soft nontender nondistended MSK: No edema Neuro: a awake alert following commands   Resolved Hospital Problem list      Assessment & Plan:   Adenocarcinoma of the colon with local recurrence AKI due to bladder outlet obstruction Chronic diarrhea CAD RUL pulmonary nodule  Discussion: Patient would prefer to have biopsy of right upper lobe nodule. Even though the lesion is small we discussed that the likelihood of diagnostic yield with subcentimeter lesions. Patient is agreeable to proceed.  Alternate approach would be consider a repeat CT scan of the chest in 3 to 6 months due to the size of the lesion to see if it continues to grow.  Patient would prefer to have biopsy.  Plan: N.p.o. midnight on Monday night. Bronchoscopy scheduled for Tuesday, 11/30/2021. We will plan for robotic assisted navigational bronchoscopy tissue sampling of the small subcentimeter right upper lobe pulmonary nodule. We talked with the risk and benefits and risk of bleeding and pneumothorax associated with this procedure. Patient is agreeable to proceed. If patient gets started on heparin make sure that it is stopped at least at 6 AM on Tuesday morning. If not continue to hold anticoagulation.  Do not restart Eliquis at this time.   BraGarner NashO [facility] 11/28/2021 3:49 PM

## 2021-11-28 NOTE — Plan of Care (Signed)
  Problem: Education: Goal: Knowledge of General Education information will improve Description: Including pain rating scale, medication(s)/side effects and non-pharmacologic comfort measures Outcome: Not Progressing   Problem: Health Behavior/Discharge Planning: Goal: Ability to manage health-related needs will improve Outcome: Not Progressing   Problem: Clinical Measurements: Goal: Ability to maintain clinical measurements within normal limits will improve Outcome: Not Progressing Goal: Will remain free from infection Outcome: Not Progressing Goal: Diagnostic test results will improve Outcome: Not Progressing Goal: Respiratory complications will improve Outcome: Not Progressing Goal: Cardiovascular complication will be avoided Outcome: Not Progressing   Problem: Activity: Goal: Risk for activity intolerance will decrease Outcome: Not Progressing   Problem: Nutrition: Goal: Adequate nutrition will be maintained Outcome: Not Progressing   Problem: Elimination: Goal: Will not experience complications related to bowel motility Outcome: Not Progressing Goal: Will not experience complications related to urinary retention Outcome: Not Progressing   Problem: Coping: Goal: Level of anxiety will decrease Outcome: Not Progressing   Problem: Elimination: Goal: Will not experience complications related to bowel motility Outcome: Not Progressing Goal: Will not experience complications related to urinary retention Outcome: Not Progressing   Problem: Pain Managment: Goal: General experience of comfort will improve Outcome: Not Progressing   Problem: Safety: Goal: Ability to remain free from injury will improve Outcome: Not Progressing   Problem: Skin Integrity: Goal: Risk for impaired skin integrity will decrease Outcome: Not Progressing   Problem: Safety: Goal: Non-violent Restraint(s) Outcome: Not Progressing

## 2021-11-28 NOTE — H&P (View-Only) (Signed)
   NAME:  Jared White, MRN:  [id number], DOB:  [birth year], LOS: 6 ADMISSION DATE:  11/21/2021, CONSULTATION DATE:  7/4 REFERRING MD:  Dahal, CHIEF COMPLAINT:  Weakness   History of Present Illness:  76 y/o male with a complex cancer history presented with AKI and hypokalemia in the setting of bladder outlet obstruction.  A CT scan of his chest was performed and showed that a RUL nodule had increased in size from 86m to 6-74mbetween January 2023 and now.  PCCM was consulted for the same.  He is a life long non-smoker  Pertinent  Medical History  Rectal Cancer - 07/2019; s/p diverting colostomy (and later reversal), radiation, neo-adjuvant treatment; 2023 surveillance colonoscopy showed anastomotic recurrence GERD Coronary Artery Disease s/p PCI and CABG Hypertension  Significant Hospital Events: Including procedures, antibiotic start and stop dates in addition to other pertinent events   7/2 admission 7/4 CT angiogram chest > neg for PE, slowly enlarging RUL nodule previously 48m34mow 6-7 mm  Interim History / Subjective:   Elderly gentleman resting in bed no distress. Would prefer to move forward with biopsy rather than having a follow-up CT scan in 3 months.  Objective   Blood pressure 120/61, pulse 81, temperature 98.5 F (36.9 C), temperature source Oral, resp. rate 19, height '5\' 8"'$  (1.727 m), weight 55.9 kg, SpO2 97 %.        Intake/Output Summary (Last 24 hours) at 11/28/2021 1546 Last data filed at 11/28/2021 1218 Gross per 24 hour  Intake --  Output 2800 ml  Net -2800 ml   Filed Weights   11/21/21 1616 11/22/21 0553  Weight: 58.1 kg 55.9 kg    Examination:  General: Chronically ill-appearing elderly gentleman resting in bed HENT: NCAT, tracking appropriately PULM: Clear to auscultation bilaterally no crackles no wheeze CV: Regular rhythm, S1-S2 GI: Soft nontender nondistended MSK: No edema Neuro: a awake alert following commands   Resolved Hospital Problem list      Assessment & Plan:   Adenocarcinoma of the colon with local recurrence AKI due to bladder outlet obstruction Chronic diarrhea CAD RUL pulmonary nodule  Discussion: Patient would prefer to have biopsy of right upper lobe nodule. Even though the lesion is small we discussed that the likelihood of diagnostic yield with subcentimeter lesions. Patient is agreeable to proceed.  Alternate approach would be consider a repeat CT scan of the chest in 3 to 6 months due to the size of the lesion to see if it continues to grow.  Patient would prefer to have biopsy.  Plan: N.p.o. midnight on Monday night. Bronchoscopy scheduled for Tuesday, 11/30/2021. We will plan for robotic assisted navigational bronchoscopy tissue sampling of the small subcentimeter right upper lobe pulmonary nodule. We talked with the risk and benefits and risk of bleeding and pneumothorax associated with this procedure. Patient is agreeable to proceed. If patient gets started on heparin make sure that it is stopped at least at 6 AM on Tuesday morning. If not continue to hold anticoagulation.  Do not restart Eliquis at this time.   BraGarner NashO [facility] 11/28/2021 3:49 PM

## 2021-11-28 NOTE — Progress Notes (Signed)
ANTICOAGULATION CONSULT NOTE   Pharmacy Consult for heparin  Indication: atrial fibrillation  No Known Allergies  Patient Measurements: Height: '5\' 8"'$  (172.7 cm) Weight: 55.9 kg (123 lb 3.8 oz) IBW/kg (Calculated) : 68.4   Vital Signs: Temp: 98.5 F (36.9 C) (07/09 1502) Temp Source: Oral (07/09 1502) BP: 120/61 (07/09 1502) Pulse Rate: 81 (07/09 1502)  Labs: Recent Labs    11/26/21 0424 11/27/21 0354 11/28/21 0536  HGB 11.2* 9.2*  --   HCT 33.8* 28.5*  --   PLT 139* 102*  --   CREATININE 1.25* 1.12 1.15    Estimated Creatinine Clearance: 43.9 mL/min (by C-G formula based on SCr of 1.15 mg/dL).   Medical History: Past Medical History:  Diagnosis Date   Arthritis    Blood transfusion without reported diagnosis    Broken back    CAD (coronary artery disease)    PCI & CABG   Cataract    "LIGHT"-RIGHT   CHF (congestive heart failure) (HCC)    Diverticulitis    mild - approx 2004   Dysrhythmia    occasional arrythmias   Family history of heart disease    GERD (gastroesophageal reflux disease)    Heart murmur    HX OF YEARS AGO    History of kidney stones    History of nuclear stress test 06/30/2011   lexiscan; normal pattern of perfusion post-stress; low risk scan    Hyperlipidemia    Hypertension    IgA nephropathy    Irregular heart beat    Myocardial infarction ([place])    Pre-diabetes    Rectal cancer ([place]) 08/19/2019   Systemic hypertension     Medications:  Medications Prior to Admission  Medication Sig Dispense Refill Last Dose   AMLODIPINE BESYLATE PO Take 1 tablet by mouth 2 (two) times daily.   11/21/2021   Ascorbic Acid (VITAMIN C PO) Take 1 tablet by mouth daily.   11/21/2021   aspirin EC 81 MG tablet Take 1 tablet (81 mg total) by mouth daily. Swallow whole. (Patient taking differently: Take 162 mg by mouth daily.) 90 tablet 3 11/21/2021   atenolol (TENORMIN) 50 MG tablet TAKE 1 TABLET BY MOUTH EVERY DAY (Patient taking differently: Take 50 mg by  mouth daily.) 90 tablet 3 11/21/2021 at 08:00   b complex vitamins capsule Take 1 capsule by mouth daily.   11/21/2021   CALCIUM PO Take 1 tablet by mouth daily.   11/21/2021   Cholecalciferol (VITAMIN D-3 PO) Take 1 capsule by mouth daily.   11/21/2021   MAGNESIUM PO Take 1 tablet by mouth daily.   11/21/2021   nitroGLYCERIN (NITROSTAT) 0.4 MG SL tablet Place 0.4 mg under the tongue every 5 (five) minutes x 3 doses as needed for chest pain.   NEVER   omeprazole (PRILOSEC) 40 MG capsule TAKE 1 CAPSULE (40 MG TOTAL) BY MOUTH DAILY. 30 capsule 3 11/21/2021   rosuvastatin (CRESTOR) 20 MG tablet TAKE 1 TABLET BY MOUTH EVERY DAY (Patient taking differently: Take 20 mg by mouth daily.) 90 tablet 3 11/21/2021   valsartan (DIOVAN) 160 MG tablet TAKE 1 TABLET BY MOUTH EVERY DAY (Patient taking differently: Take 160 mg by mouth daily.) 90 tablet 3 11/21/2021   VITAMIN A PO Take 1 tablet by mouth daily.   11/21/2021   VITAMIN E PO Take 1 capsule by mouth daily at 6 (six) AM.   11/21/2021   Scheduled:   calcium carbonate  400 mg of elemental calcium Oral Daily  Chlorhexidine Gluconate Cloth  6 each Topical Q0600   cholestyramine  4 g Oral BID   diltiazem  60 mg Oral Q8H   dronedarone  400 mg Oral BID WC   feeding supplement  1 Container Oral BID BM   feeding supplement  237 mL Oral BID BM   multivitamin with minerals  1 tablet Oral Daily   pantoprazole  40 mg Oral Daily   QUEtiapine  50 mg Oral QHS   rosuvastatin  20 mg Oral Daily   sodium chloride flush  10-40 mL Intracatheter Q12H    Assessment: 76 yo male with afib/RVR and pharmacy consulted to dose heparin. He was on apixaban but this was discontinued due to bloody bowel movements which have resolved per chart notes.  -hg= 9.2 (range has been 9.9-11.7 recently) -Last apixaban dose given 7/5 ~ 10am  Goal of Therapy:  Heparin level 0.3-0.7 units/ml Monitor platelets by anticoagulation protocol: Yes   Plan:  -No heparin bolus due to recent bleeding -start  heparin at 750 units/hr -Heparin level in 8 hours and daily wth CBC daily  Hildred Laser, PharmD Clinical Pharmacist **Pharmacist phone directory can now be found on [email or web address] ([email or web address]).  Listed under [place].

## 2021-11-28 NOTE — Progress Notes (Signed)
Physical Therapy Treatment Patient Details Name: Jared White MRN: [id number] DOB: 1946/02/04 Today's Date: 11/28/2021   History of Present Illness Pt is a 76 y.o. male admitted 11/21/21 with increasing weakness; workup for AKI and hypocalcemia in the setting of bladder outlet obstruction. New Afib 7/4; also with episodes of AMS. Chest CTA 7/4 with slowly enlarging RUL nodule. Awaiting bronchoscopy for RUL nodule. Of note, pt awaiting permanent colostomy sx that has been delayed. Other PMH includes rectal CA s/p radiation with diverting ileostomy s/p reversal, chronic diarrhea, hypocalcemia, CAD s/p CABG, CKD, HTN, HLD, tobacco use.    PT Comments    Patient slightly groggy throughout session. Required mod assist for sit to stand due to posterior bias with feet trying to slide forward as he attempted come to stand. Ambulation with RW with min assist for loss of balance x 2 and for maneuvering RW (tending to drift left and then right due to decr balance). Patient oriented to his room # and able to find his way back to his room today. Confusion remains a contributing factor to his decr independence with mobility.     Recommendations for follow up therapy are one component of a multi-disciplinary discharge planning process, led by the attending physician.  Recommendations may be updated based on patient status, additional functional criteria and insurance authorization.  Follow Up Recommendations  Skilled nursing-short term rehab (<3 hours/day) Can patient physically be transported by private vehicle: Yes   Assistance Recommended at Discharge Frequent or constant Supervision/Assistance  Patient can return home with the following A lot of help with bathing/dressing/bathroom;Assist for transportation;Direct supervision/assist for medications management;Direct supervision/assist for financial management;Assistance with cooking/housework;Help with stairs or ramp for entrance;A little help with walking  and/or transfers   Equipment Recommendations  Rolling walker (2 wheels);BSC/3in1    Recommendations for Other Services       Precautions / Restrictions Precautions Precautions: Fall Restrictions Weight Bearing Restrictions: No     Mobility  Bed Mobility Overal bed mobility: Needs Assistance Bed Mobility: Supine to Sit     Supine to sit: Min assist, HOB elevated (HOB 15)     General bed mobility comments: MinA for trunk elevation, pt able to scoot hips to EOB without assist; increased time and effort    Transfers Overall transfer level: Needs assistance Equipment used: Rolling walker (2 wheels) Transfers: Sit to/from Stand Sit to Stand: Mod assist           General transfer comment: prolonged time with pt attempting to pull on RW to stand unsuccessfully, cues for hand placement and pt required mod assist due to posterior bias as coming to stand    Ambulation/Gait Ambulation/Gait assistance: Min assist Gait Distance (Feet): 90 Feet Assistive device: Rolling walker (2 wheels) Gait Pattern/deviations: Step-to pattern, Step-through pattern, Decreased stride length, Shuffle, Trunk flexed, Leaning posteriorly Gait velocity: Decreased     General Gait Details: slow, unsteady gait with RW and twice needing min assist to prevent fall (+LOB); assist also for managing RW as he initially had tendency to drift left and then overcorrecting and drifting to right.   Stairs             Wheelchair Mobility    Modified Rankin (Stroke Patients Only)       Balance Overall balance assessment: Needs assistance Sitting-balance support: No upper extremity supported, Feet supported Sitting balance-Leahy Scale: Fair     Standing balance support: Bilateral upper extremity supported, During functional activity, Reliant on assistive device for balance Standing  balance-Leahy Scale: Poor Standing balance comment: reliant on external assist to maintain balance when pt letting  go of RW                            Cognition Arousal/Alertness: Awake/alert Behavior During Therapy: WFL for tasks assessed/performed, Flat affect Overall Cognitive Status: Impaired/Different from baseline Area of Impairment: Orientation, Attention, Memory, Following commands, Safety/judgement                 Orientation Level: Disoriented to, Time Current Attention Level: Selective Memory: Decreased short-term memory Following Commands: Follows one step commands with increased time Safety/Judgement: Decreased awareness of safety, Decreased awareness of deficits     General Comments: agreeable to therapy; does not recall previous walks with PT        Exercises      General Comments General comments (skin integrity, edema, etc.): HR 105-109 during session      Pertinent Vitals/Pain Pain Assessment Pain Assessment: No/denies pain Breathing: normal Negative Vocalization: none Facial Expression: smiling or inexpressive Body Language: relaxed Consolability: no need to console PAINAD Score: 0    Home Living                          Prior Function            PT Goals (current goals can now be found in the care plan section) Acute Rehab PT Goals Patient Stated Goal: be able to return home, work on my cars PT Goal Formulation: With patient/family Time For Goal Achievement: 12/07/21 Potential to Achieve Goals: Fair Progress towards PT goals: Progressing toward goals    Frequency    Min 3X/week      PT Plan Current plan remains appropriate    Co-evaluation              AM-PAC PT "6 Clicks" Mobility   Outcome Measure  Help needed turning from your back to your side while in a flat bed without using bedrails?: A Little Help needed moving from lying on your back to sitting on the side of a flat bed without using bedrails?: A Little Help needed moving to and from a bed to a chair (including a wheelchair)?: A Lot Help needed  standing up from a chair using your arms (e.g., wheelchair or bedside chair)?: A Lot Help needed to walk in hospital room?: A Little Help needed climbing 3-5 steps with a railing? : A Lot 6 Click Score: 15    End of Session Equipment Utilized During Treatment: Gait belt Activity Tolerance: Patient limited by fatigue Patient left: with call bell/phone within reach;in bed;with bed alarm set (per RN has been sleepy due to haldol) Nurse Communication: Mobility status PT Visit Diagnosis: Other abnormalities of gait and mobility (R26.89);Difficulty in walking, not elsewhere classified (R26.2);Muscle weakness (generalized) (M62.81)     Time: 3016-0109 PT Time Calculation (min) (ACUTE ONLY): 16 min  Charges:  $Gait Training: 8-22 mins                      Arby Barrette, PT [place]  Office [phone]    Rexanne Mano 11/28/2021, 3:06 PM

## 2021-11-28 NOTE — Progress Notes (Signed)
PROGRESS NOTE    Jared White  VZC:[id number] DOB: [birth year] DOA: 11/21/2021 PCP: Caprice Red, MD   Brief Narrative:  Jared White is a 76 y.o. male with PMH significant for HTN, HLD, CAD/stent/CABG, CHF, GERD, IgA nephropathy, CKD, rectal cancer status post radiation, neoadjuvant treatment, diverting ileostomy s/p reversal with recent anastomotic recurrence, chronic diarrhea, hypocalcemia.    Patient presented to the ED on 7/2 with complaint of constant nausea for the past 2 days leading to poor oral intake, increased weakness, impaired gait, decreased urine output. He has chronic diarrhea due to colon cancer, has low appetite and was only able to tolerate liquid diet.  He is on chronic calcium and magnesium supplementation.  For the past 2 to 3 weeks, patient has also noted right paralumbar pain worse with movement and better at rest.    In the ED, patient was afebrile, hemodynamically stable. Labs showed low serum calcium and magnesium level. He was noted to have urinary retention of more than 470 ml.  Foley catheter was inserted. Admitted to hospital service for further evaluation management.   Had episode of A-fib RVR on morning of 7/4, had extreme restlessness and agitation with altered mental status.  He was started on Cardizem drip.  He converted to normal sinus rhythm and was switched to oral Cardizem.  Went back into A-fib RVR and resumed on Cardizem drip.  Cardiology was consulted, started on Multaq.    Has also been having episodes of bloody bowel movements in setting of Eliquis use.  Eliquis is currently on hold.  Hemoglobin remains stable.    Assessment & Plan:   Principal Problem:   Hypocalcemia Active Problems:   CAD (coronary artery disease)   Essential hypertension   S/P CABG x 4   Hypomagnesemia   Rectal cancer (HCC)   Acute renal failure superimposed on stage 3b chronic kidney disease (HCC)   Hydroureteronephrosis   Lumbar back pain   Elevated troponin    Protein-calorie malnutrition, severe   PAF (paroxysmal atrial fibrillation) (HCC)  Acute on chronic hypocalcemia, hypomagnesemia -Has chronic low calcium and magnesium levels due to chronic diarrhea -Continue to monitor calcium and magnesium levels and replace accordingly.  A-fib with RVR, new onset with paroxysmal A-fib -Initially converted to normal sinus rhythm on Cardizem drip and subsequently switched to oral Cardizem.  Converted back into A-fib RVR and resumed back on Cardizem drip.  Now in sinus tachycardia.  Cardiology Dr. Bonney Roussel team consulted. -CHA2DS2-VASc score 4, recommend anticoagulation.  Started on Eliquis, but this was discontinued in setting of worsening bloody bowel movements.  Patient reportedly has not had any further rectal bleeding and his hemoglobin has remained stable.  Patient remains on Multaq as well as Cardizem IV.  I have requested cardiology to try to transition him to oral medications for rate control.  They are planning to start him on IV heparin and monitor for 1 to 2 days for rectal bleeding.  Delirium and sundowning in setting of undiagnosed dementia Patient fully alert and oriented.  Brother-in-law at the bedside reports no further episodes of sundowning.  Continue current nightly Seroquel and as needed Haldol. 1. Avoid benzodiazepines, antihistamines, anticholinergics, and minimize opiate use as these may worsen delirium. 2: Assess, prevent and manage pain as lack of treatment can result in delirium.  3: Provide appropriate lighting and clear signage; a clock and calendar should be easily visible to the patient. 4: Monitor environmental factors. Reduce light and noise at night (close shades, turn off  lights, turn off TV, ect). Correct any alterations in sleep cycle. 5: Reorient the patient to person, place, time and situation on each encounter.  6: Correct sensory deficits if possible (replace eye glasses, hearing aids, ect). 7: Avoid restraints if able.  Severely delirious patients benefit from constant observation by a sitter.   History of rectal cancer -3/21 rectal cancer diagnosed after seen colonoscopy.  Completed total neoadjuvant treatment with Dr. Tami Lin.  Finished radiation in 11/21.  Underwent APR on January 2022 with a diverting ileostomy.  May 2022, underwent ileostomy reversal.  Follow-up endoscopy recently showed an anastomotic polyp biopsy of which showed adenocarcinoma. -General surgery planned for permanent colostomy but the surgery had to be canceled twice.  First time was because of poor bowel prep and second time on 11/10/2021 was because of hypocalcemia.   -Per Dr. Marcello Moores, could consider a diverting ostomy in the hospital, if pt proceeding to a palliative situation -Currently abdominoperineal resection scheduled for 8/11 -Bloody bowel movement.  Repeat hemoglobin is stable    Pulmonary nodule -CT angio of chest showed slowly enlarging solid right upper lobe pulmonary nodule since last check, now 6 to 7 mm, this is more suspicious for bronchogenic carcinoma then solitary lung metastasis. -Appreciate pulmonology, planning for bronchoscopy during hospitalization.  They are working on schedule availability, no reports from pulmonary in last 4 days.   Hypertension -Controlled, Cardizem   Chronic diarrhea/constipation -Questran   Demand ischemia -No symptoms of ACS -Aspirin on hold due to bloody bowel movement   HLD -Crestor    AKI on CKD stage II, history of IgA nephropathy -Baseline creatinine 0.9-1 -AKI resolved, continue to monitor   Chronic L2 compression fracture -Reported several weeks of right paralumbar spinal pain worse with movement and better at rest -Lumbar spine x-ray showed chronic compression fracture deformity at the level of L2. Moderate to marked severity multilevel degenerative disc disease   Bilateral hydroureteronephrosis -CT abdomen noted bilateral hydroureteronephrosis secondary to urinary  retention likely at the level of bladder neck.   -Foley catheter was placed, plan for voiding trial prior to discharge   GERD -PPI  Anemia of chronic disease: Hemoglobin stable.  Chronic thrombocytopenia: Platelets stable.  No bleeding.   DVT prophylaxis: Place and maintain sequential compression device Start: 11/25/21 1228, Eliquis   Code Status: Full Code  Family Communication: Brother-in-law at bedside.  Plan of care discussed.  Status is: Inpatient Remains inpatient appropriate because: On Cardizem drip.   Estimated body mass index is 18.74 kg/m as calculated from the following:   Height as of this encounter: '5\' 8"'$  (1.727 m).   Weight as of this encounter: 55.9 kg.    Nutritional Assessment: Body mass index is 18.74 kg/m.Marland Kitchen Seen by dietician.  I agree with the assessment and plan as outlined below: Nutrition Status: Nutrition Problem: Severe Malnutrition Etiology: chronic illness (colon cancer) Signs/Symptoms: moderate fat depletion, severe fat depletion, severe muscle depletion, moderate muscle depletion Interventions: Boost Breeze, Magic cup, MVI  . Skin Assessment: I have examined the patient's skin and I agree with the wound assessment as performed by the wound care RN as outlined below:    Consultants:  Cardiology  Procedures:  None  Antimicrobials:  Anti-infectives (From admission, onward)    None         Subjective: Patient seen and examined.  Brother-in-law at the bedside.  Patient fully alert and oriented and he has no complaints.  Objective: Vitals:   11/27/21 1944 11/28/21 9767 11/28/21 0310 11/28/21 3419  BP: (!) 129/54 124/61 (!) 116/58 (!) 119/59  Pulse:  77 79 69  Resp:  '17 17 10  '$ Temp: 98.4 F (36.9 C) 98.4 F (36.9 C) 98.4 F (36.9 C) 98.5 F (36.9 C)  TempSrc: Oral Oral Oral Oral  SpO2:  96% 98% 97%  Weight:      Height:        Intake/Output Summary (Last 24 hours) at 11/28/2021 1113 Last data filed at 11/28/2021 0729 Gross  per 24 hour  Intake --  Output 2200 ml  Net -2200 ml    Filed Weights   11/21/21 1616 11/22/21 0553  Weight: 58.1 kg 55.9 kg    Examination:  General exam: Appears calm and comfortable  Respiratory system: Clear to auscultation. Respiratory effort normal. Cardiovascular system: S1 & S2 heard, RRR. No JVD, murmurs, rubs, gallops or clicks. No pedal edema. Gastrointestinal system: Abdomen is nondistended, soft and nontender. No organomegaly or masses felt. Normal bowel sounds heard. Central nervous system: Alert and oriented. No focal neurological deficits. Extremities: Symmetric 5 x 5 power. Skin: No rashes, lesions or ulcers.  Psychiatry: Judgement and insight appear poor  Data Reviewed: I have personally reviewed following labs and imaging studies  CBC: Recent Labs  Lab 11/23/21 0143 11/24/21 0604 11/25/21 0850 11/26/21 0424 11/27/21 0354  WBC 9.6 6.8 8.3 10.2 8.0  NEUTROABS 7.6 5.5  --   --   --   HGB 10.9* 9.9* 11.7* 11.2* 9.2*  HCT 31.3* 29.1* 34.7* 33.8* 28.5*  MCV 93.4 94.8 95.3 96.8 97.6  PLT 106* 88* 132* 139* 102*    Basic Metabolic Panel: Recent Labs  Lab 11/23/21 0143 11/24/21 0604 11/25/21 0423 11/26/21 0424 11/27/21 0354 11/28/21 0536  NA 139 140 139 137 138 137  K 3.5 3.6 4.2 4.5 4.5 3.8  CL 110 106 110 107 112* 108  CO2 20* 24 22 20* 21* 22  GLUCOSE 93 107* 130* 155* 97 100*  BUN '20 18 17 '$ 26* 21 16  CREATININE 0.98 1.02 1.00 1.25* 1.12 1.15  CALCIUM 7.0* 7.5* 8.5* 9.1 8.2* 8.5*  MG 2.0  --  1.6* 1.7 2.0 1.6*  PHOS 2.1*  --   --   --   --   --     GFR: Estimated Creatinine Clearance: 43.9 mL/min (by C-G formula based on SCr of 1.15 mg/dL). Liver Function Tests: Recent Labs  Lab 11/21/21 1719 11/23/21 0143 11/24/21 0604 11/25/21 0423  AST '24 31 27 25  '$ ALT '23 17 19 20  '$ ALKPHOS 67 58 59 62  BILITOT 1.1 0.8 0.7 0.8  PROT 6.1* 5.5* 5.1* 6.0*  ALBUMIN 3.3* 2.9* 2.7* 3.1*    No results for input(s): "LIPASE", "AMYLASE" in the last  168 hours. No results for input(s): "AMMONIA" in the last 168 hours. Coagulation Profile: No results for input(s): "INR", "PROTIME" in the last 168 hours. Cardiac Enzymes: No results for input(s): "CKTOTAL", "CKMB", "CKMBINDEX", "TROPONINI" in the last 168 hours. BNP (last 3 results) No results for input(s): "PROBNP" in the last 8760 hours. HbA1C: No results for input(s): "HGBA1C" in the last 72 hours. CBG: No results for input(s): "GLUCAP" in the last 168 hours. Lipid Profile: No results for input(s): "CHOL", "HDL", "LDLCALC", "TRIG", "CHOLHDL", "LDLDIRECT" in the last 72 hours. Thyroid Function Tests: No results for input(s): "TSH", "T4TOTAL", "FREET4", "T3FREE", "THYROIDAB" in the last 72 hours. Anemia Panel: No results for input(s): "VITAMINB12", "FOLATE", "FERRITIN", "TIBC", "IRON", "RETICCTPCT" in the last 72 hours. Sepsis Labs: No results for input(s): "  PROCALCITON", "LATICACIDVEN" in the last 168 hours.  No results found for this or any previous visit (from the past 240 hour(s)).   Radiology Studies: No results found.  Scheduled Meds:  calcium carbonate  400 mg of elemental calcium Oral Daily   Chlorhexidine Gluconate Cloth  6 each Topical Q0600   cholestyramine  4 g Oral BID   dronedarone  400 mg Oral BID WC   feeding supplement  1 Container Oral BID BM   feeding supplement  237 mL Oral BID BM   multivitamin with minerals  1 tablet Oral Daily   pantoprazole  40 mg Oral Daily   QUEtiapine  50 mg Oral QHS   rosuvastatin  20 mg Oral Daily   sodium chloride flush  10-40 mL Intracatheter Q12H   Continuous Infusions:  sodium chloride 100 mL/hr at 11/28/21 0677   diltiazem (CARDIZEM) infusion 7.5 mg/hr (11/28/21 0548)     LOS: 6 days   Darliss Cheney, MD [facility]  11/28/2021, 11:13 AM   *Please note that this is a verbal dictation therefore any spelling or grammatical errors are due to the "[place] [employer]" system interpretation.  Please page via [place]  and do not message via secure chat for urgent patient care matters. Secure chat can be used for non urgent patient care matters.  How to contact the [facility] Attending or Consulting provider [place] or covering provider during after hours [place], for this patient?  Check the care team in [facility] and look for a) attending/consulting [facility] provider listed and b) the [facility] [place] team listed. Page or secure chat [phone]. Log into [email or web address] and use 's universal password to access. If you do not have the password, please contact the hospital operator. Locate the [facility] provider you are looking for under [facility] and page to a number that you can be directly reached. If you still have difficulty reaching the provider, please page the [facility] (Director on Call) for the Hospitalists listed on amion for assistance.

## 2021-11-28 NOTE — Progress Notes (Signed)
Progress Note  Patient Name: Jared White Date of Encounter: 11/28/2021  Attending physician: Darliss Cheney, MD Primary care provider: Caprice Red, MD Primary Cardiologist: Dr. Vernell Leep  Subjective: Jared White is a 76 y.o. male who was seen and examined at bedside  Denies anginal discomfort or heart failure symptoms. Resting in bed comfortably. Accompanied by his friend/cardiologist  Case discussed and reviewed with his nurse.  Objective: Vital Signs in the last 24 hours: Temp:  [98.4 F (36.9 C)-98.6 F (37 C)] 98.5 F (36.9 C) (07/09 1502) Pulse Rate:  [69-81] 81 (07/09 1502) Resp:  [10-19] 19 (07/09 1502) BP: (116-129)/(54-61) 120/61 (07/09 1502) SpO2:  [91 %-98 %] 97 % (07/09 1502)  Intake/Output:  Intake/Output Summary (Last 24 hours) at 11/28/2021 1552 Last data filed at 11/28/2021 1218 Gross per 24 hour  Intake --  Output 2800 ml  Net -2800 ml    Net IO Since Admission: -4,372.87 mL [11/28/21 1552]  Weights:  Filed Weights   11/21/21 1616 11/22/21 0553  Weight: 58.1 kg 55.9 kg    Telemetry: Personally reviewed. SR   Physical examination: PHYSICAL EXAM:    11/28/2021    3:02 PM 11/28/2021   11:17 AM 11/28/2021    7:18 AM  Vitals with BMI  Systolic 938 101 751  Diastolic 61 59 59  Pulse 81 78 69    CONSTITUTIONAL: Frail, appears older than stated age, hemodynamically stable, no acute distress.  HEENT: Normocephalic, atraumatic, moist mucous membranes, no JVP, no scleral icterus or arcus senilis. Lungs: Clear to auscultation bilaterally. Heart: Regular, positive S1-S2, no murmurs rubs or gallops appreciated. Abdomen: Soft, nontender, nondistended, positive bowel sounds in all 4 quadrants, Extremities: Warm to touch, no pitting edema bilaterally, palpable DP and PT pulses. Neuro: Awake and alert x3, no conversing well, carrying out conversations, no focal neurological deficits, range of motion appropriate Psych: Normal affect   Lab  Results: Hematology Recent Labs  Lab 11/25/21 0850 11/26/21 0424 11/27/21 0354  WBC 8.3 10.2 8.0  RBC 3.64* 3.49* 2.92*  HGB 11.7* 11.2* 9.2*  HCT 34.7* 33.8* 28.5*  MCV 95.3 96.8 97.6  MCH 32.1 32.1 31.5  MCHC 33.7 33.1 32.3  RDW 13.1 13.5 13.6  PLT 132* 139* 102*    Chemistry Recent Labs  Lab 11/23/21 0143 11/24/21 0604 11/25/21 0423 11/26/21 0424 11/27/21 0354 11/28/21 0536  NA 139 140 139 137 138 137  K 3.5 3.6 4.2 4.5 4.5 3.8  CL 110 106 110 107 112* 108  CO2 20* 24 22 20* 21* 22  GLUCOSE 93 107* 130* 155* 97 100*  BUN '20 18 17 '$ 26* 21 16  CREATININE 0.98 1.02 1.00 1.25* 1.12 1.15  CALCIUM 7.0* 7.5* 8.5* 9.1 8.2* 8.5*  PROT 5.5* 5.1* 6.0*  --   --   --   ALBUMIN 2.9* 2.7* 3.1*  --   --   --   AST '31 27 25  '$ --   --   --   ALT '17 19 20  '$ --   --   --   ALKPHOS 58 59 62  --   --   --   BILITOT 0.8 0.7 0.8  --   --   --   GFRNONAA >60 >60 >60 >60 >60 >60  ANIONGAP '9 10 7 10 5 7    '$ DDimer  Recent Labs  Lab 11/23/21 0428  DDIMER 1.19*     Hemoglobin A1c:  Lab Results  Component Value Date   HGBA1C 5.8 (  H) 11/01/2021   MPG 119.76 11/01/2021    TSH  Recent Labs    11/23/21 0428  TSH 0.803    Lipid Panel     Component Value Date/Time   CHOL 146 10/03/2015 1603   TRIG 162 (H) 10/03/2015 1603   HDL 29 (L) 10/03/2015 1603   CHOLHDL 5.0 10/03/2015 1603   VLDL 32 (H) 10/03/2015 1603   LDLCALC 85 10/03/2015 1603    Imaging: No results found.  CARDIAC DATABASE: EKG 11/25/2021: Afib with RVR 135 bpm Nonspecific ST-T abnormality   Echocardiogram 11/22/2021:  1. Left ventricular ejection fraction, by estimation, is 60 to 65%. The  left ventricle has normal function. The left ventricle has no regional  wall motion abnormalities. There is mild left ventricular hypertrophy.  Left ventricular diastolic parameters  are consistent with Grade I diastolic dysfunction (impaired relaxation).   2. Right ventricular systolic function is normal. The right  ventricular  size is normal.   3. Left atrial size was moderately dilated.   4. The mitral valve is normal in structure. Mild mitral valve  regurgitation. No evidence of mitral stenosis.   5. The aortic valve is tricuspid. Aortic valve regurgitation is not  visualized. Aortic valve sclerosis is present, with no evidence of aortic  valve stenosis.   6. Aortic dilatation noted. There is borderline dilatation of the  ascending aorta, measuring 39 mm.   7. The inferior vena cava is normal in size with greater than 50%  respiratory variability, suggesting right atrial pressure of 3 mmHg.   Scheduled Meds:  calcium carbonate  400 mg of elemental calcium Oral Daily   Chlorhexidine Gluconate Cloth  6 each Topical Q0600   cholestyramine  4 g Oral BID   diltiazem  60 mg Oral Q8H   dronedarone  400 mg Oral BID WC   feeding supplement  1 Container Oral BID BM   feeding supplement  237 mL Oral BID BM   multivitamin with minerals  1 tablet Oral Daily   pantoprazole  40 mg Oral Daily   QUEtiapine  50 mg Oral QHS   rosuvastatin  20 mg Oral Daily   sodium chloride flush  10-40 mL Intracatheter Q12H    Continuous Infusions:  sodium chloride 100 mL/hr at 11/28/21 0737   diltiazem (CARDIZEM) infusion 5 mg/hr (11/28/21 1319)    PRN Meds: haloperidol lactate **OR** haloperidol lactate, ondansetron (ZOFRAN) IV, mouth rinse, sodium chloride flush   IMPRESSION & RECOMMENDATIONS: Jared White is a 76 y.o. Caucasian male whose past medical history and cardiac risk factors include: coronary artery disease status post CABG 2001 by Dr. Prescott Gum (LIMA-LAD, SVG-Diag, seg SVG-ramus & OM, SVG-PDA), hypertension, IgA nephropathy, hyperlipidemia, rectal mass s/p radiation, neoadjuvant treatment, diverting ileostomy s/p reversal with recent anastomotic recurrence, upcoming colostomy, now with new onset PAF (in the setting of reversible causes).  Impression: Paroxysmal atrial fibrillation. Elevated troponins  likely secondary to supply demand ischemia. Established CAD status post 5 vessel bypass without angina pectoris. Hypertension. Hyperlipidemia. Rectal mass s/p radiation, neoadjuvant treatment, diverting ileostomy s/p reversal with recent anastomotic recurrence, upcoming colostomy Pulmonary nodule  Recommendations: Paroxysmal atrial fibrillation: Likely due to reversible causes-severe dehydration, multiple electrolyte abnormalities After correcting electrolyte abnormalities he would have paroxysms of atrial fibrillation. He was started on Multaq to maintain normal sinus rhythm as he is being considered for possible colostomy and bronchoscopy. He was started on oral anticoagulation given his CHA2DS2-VASc score of 4 (HTN, age, aortic atherosclerosis); however, had a bloody bowel  movement after the initiation of Eliquis. Anticoagulation was held thereafter.  He has not had any reoccurrence of bleeding since his initial event. Patient denies prior history of gastrointestinal or  intracranial bleeding.  No recent history of falls per patient. We discussed the risks, benefits, and alternatives to oral anticoagulation. Patient would like to retrial anticoagulation. Start IV heparin per A-fib protocol and spoke to nursing staff with regards to monitoring for signs of bleeding. We will type and screen prophylactically for now. We will start Cardizem 60 mg p.o. 3 times daily with holding parameters and discontinue Cardizem drip 2 hours after initiation of oral medications. I had a long discussion with the patient's nurse with regards to recommendations and follow through. I have asked the patient to discuss long-term anticoagulation with his brother-in-law/POA.  So their questions  can addressed prior to discharge. Keep potassium at 4 and magnesium at 2.  Magnesium supplemented earlier this morning Continue telemetry.  Elevated troponin likely secondary to supply demand ischemia: Denies angina  pectoris.  Hyperlipidemia: Continue current medical therapy.  Rectal mass status post radiation, neoadjuvant therapy, diverting ileostomy status post reversal with upcoming colostomy: Defer to primary team  Pulmonary nodule: Currently being managed by pulmonary medicine.  Tentatively scheduled for bronchoscopy/biopsy 11/30/2021.  RN and primary team aware of the plan.   Dr. Virgina Jock to resume care starting 11/29/2021.   Patient's questions and concerns were addressed to his satisfaction. He voices understanding of the instructions provided during this encounter.   This note was created using a voice recognition software as a result there may be grammatical errors inadvertently enclosed that do not reflect the nature of this encounter. Every attempt is made to correct such errors.  Total time spent: 38 minutes.   [facility]  Pager: [phone] Office: [phone] 11/28/2021, 3:52 PM

## 2021-11-28 NOTE — TOC Progression Note (Addendum)
Transition of Care [facility]) - Progression Note    Patient Details  Name: Jared White MRN: [id number] Date of Birth: [birth year]  [facility] CM/SW Contact  Joanne Chars, LCSW Phone Number: 11/28/2021, 10:33 AM  Clinical Narrative:   CSW spoke with pt brother Kathreen Cosier, they would like to accept offer at [facility].  Brother will be returning to [place] shortly but available by phone.  Message sent to [place]/[facility]  Spoke to PT and they will put pt down for PT first thing Monday.    Expected Discharge Plan: [place] Barriers to Discharge: Continued Medical Work up  Expected Discharge Plan and Services Expected Discharge Plan: [place] In-house Referral: Clinical Social Work   Post Acute Care Choice: [place] Living arrangements for the past 2 months: [place] Determinants of Health (SDOH) Interventions    Readmission Risk Interventions    08/26/2020    1:33 PM  Readmission Risk Prevention Plan  Transportation Screening Complete  PCP or Specialist Appt within 3-5 Days Complete  HRI or [place] Complete  Social Work Consult for [place] Planning/Counseling Complete  Palliative Care Screening Not Applicable  Medication Review Press photographer) Complete

## 2021-11-29 ENCOUNTER — Telehealth (HOSPITAL_COMMUNITY): Payer: Self-pay | Admitting: Pharmacy Technician

## 2021-11-29 ENCOUNTER — Other Ambulatory Visit (HOSPITAL_COMMUNITY): Payer: Self-pay

## 2021-11-29 LAB — BASIC METABOLIC PANEL WITH GFR
Anion gap: 7 (ref 5–15)
BUN: 21 mg/dL (ref 8–23)
CO2: 21 mmol/L — ABNORMAL LOW (ref 22–32)
Calcium: 8.8 mg/dL — ABNORMAL LOW (ref 8.9–10.3)
Chloride: 108 mmol/L (ref 98–111)
Creatinine, Ser: 1.58 mg/dL — ABNORMAL HIGH (ref 0.61–1.24)
GFR, Estimated: 45 mL/min — ABNORMAL LOW
Glucose, Bld: 116 mg/dL — ABNORMAL HIGH (ref 70–99)
Potassium: 4.1 mmol/L (ref 3.5–5.1)
Sodium: 136 mmol/L (ref 135–145)

## 2021-11-29 LAB — CBC
HCT: 28.3 % — ABNORMAL LOW (ref 39.0–52.0)
Hemoglobin: 9.4 g/dL — ABNORMAL LOW (ref 13.0–17.0)
MCH: 32.1 pg (ref 26.0–34.0)
MCHC: 33.2 g/dL (ref 30.0–36.0)
MCV: 96.6 fL (ref 80.0–100.0)
Platelets: 112 10*3/uL — ABNORMAL LOW (ref 150–400)
RBC: 2.93 MIL/uL — ABNORMAL LOW (ref 4.22–5.81)
RDW: 13.3 % (ref 11.5–15.5)
WBC: 5.5 10*3/uL (ref 4.0–10.5)
nRBC: 0 % (ref 0.0–0.2)

## 2021-11-29 LAB — HEPARIN LEVEL (UNFRACTIONATED)
Heparin Unfractionated: 0.62 IU/mL (ref 0.30–0.70)
Heparin Unfractionated: 0.77 [IU]/mL — ABNORMAL HIGH (ref 0.30–0.70)

## 2021-11-29 LAB — MAGNESIUM: Magnesium: 1.6 mg/dL — ABNORMAL LOW (ref 1.7–2.4)

## 2021-11-29 MED ORDER — ENSURE ENLIVE PO LIQD
237.0000 mL | Freq: Three times a day (TID) | ORAL | Status: DC
  Administered 2021-11-29 – 2021-11-30 (×3): 237 mL via ORAL

## 2021-11-29 MED ORDER — MAGNESIUM SULFATE 2 GM/50ML IV SOLN
2.0000 g | Freq: Once | INTRAVENOUS | Status: AC
  Administered 2021-11-29: 2 g via INTRAVENOUS
  Filled 2021-11-29: qty 50

## 2021-11-29 MED ORDER — CALCIUM GLUCONATE-NACL 2-0.675 GM/100ML-% IV SOLN
2.0000 g | Freq: Once | INTRAVENOUS | Status: AC
Start: 2021-11-29 — End: 2021-11-29
  Administered 2021-11-29: 2000 mg via INTRAVENOUS
  Filled 2021-11-29: qty 100

## 2021-11-29 NOTE — TOC Benefit Eligibility Note (Signed)
Patient Teacher, English as a foreign language completed.    The patient is currently admitted and upon discharge could be taking Multaq 400 mg tablets.  The current 30 day co-pay is, $10.35.   The patient is insured through [place], [place] Patient Advocate Specialist [place] Patient Advocate Team Direct Number: [phone]  Fax: [phone]

## 2021-11-29 NOTE — Progress Notes (Signed)
Initial Nutrition Assessment  DOCUMENTATION CODES:   Severe malnutrition in context of chronic illness  INTERVENTION:   Continue Ensure Enlive po BID, each supplement provides 350 kcal and 20 grams of protein.  Continue Magic cup TID with meals, each supplement provides 290 kcal and 9 grams of protein  Continue liberalized diet  Continue MVI with Minerals  Feeding assistance with meals   NUTRITION DIAGNOSIS:   Severe Malnutrition related to chronic illness (colon cancer) as evidenced by moderate fat depletion, severe fat depletion, severe muscle depletion, moderate muscle depletion.  Being addressed via supplements, liberalized diet  GOAL:   Patient will meet greater than or equal to 90% of their needs  Progressing  MONITOR:   PO intake, Supplement acceptance, Labs, Weight trends  REASON FOR ASSESSMENT:   Malnutrition Screening Tool    ASSESSMENT:   Pt admitted with nausea and weakness, found to have hypocalcemia. PMH significant for rectal mass s/p radiation, neoadjuvant treatment, diverting ileostomy s/p reversal with recent anastomotic recurrence, chronic diarrhea, hypocalcemia, CAD s/p CABG, CKD IIIb, HTN and HLD.  Noted general surgery planned for chronic colostomy but had to be cancelled twice. Noted abdominoperineal resection scheduled for 12/31/21  Noted plan for Bronch on 7/11  Pt reports continued poor appetite but eating the best he can. Working on his lunch tray with help of family on visit today. Limited documentation of po intake; pt ate 50% at breakfast this AM; only other recorded po was from 7/03 (60-90%)  Pt likes the [employer], eating some of these and encouraged pt to continue to do so.   Pt also drinking 1-2 Ensure per day. Pt also with order for Boost Breeze. Pt reports he likes both but prefers Ensure-ensure with more calories and protein per same volume; plan to d/c Boost Breeze  No new weight since admission.  Labs: reviewed Meds: NS at  100 ml/hr    Diet Order:   Diet Order             Diet NPO time specified  Diet effective midnight           Diet regular Room service appropriate? Yes; Fluid consistency: Thin  Diet effective now                   EDUCATION NEEDS:   Education needs have been addressed  Skin:  Skin Assessment: Skin Integrity Issues: Skin Integrity Issues:: Other (Comment) Other: skin tear L coccyx, L elbow  Last BM:  7/3 (type 6)  Height:   Ht Readings from Last 1 Encounters:  11/21/21 '5\' 8"'$  (1.727 m)    Weight:   Wt Readings from Last 1 Encounters:  11/22/21 55.9 kg    Ideal Body Weight:  70 kg  BMI:  Body mass index is 18.74 kg/m.  Estimated Nutritional Needs:   Kcal:  1700-1900  Protein:  80-95g  Fluid:  >/=1.7L    Kerman Passey MS, RDN, LDN, CNSC Registered Dietitian 3 Clinical Nutrition RD Pager and On-Call Pager Number Located in [place]

## 2021-11-29 NOTE — Telephone Encounter (Signed)
Pharmacy Patient Advocate Encounter  Insurance verification completed.    The patient is insured through [employer] Part D   The patient is currently admitted and ran test claims for the following: Multaq 400 mg tablets..  Copays and coinsurance results were relayed to Inpatient clinical team.

## 2021-11-29 NOTE — Progress Notes (Addendum)
PROGRESS NOTE    Jared White  IPJ:[id number] DOB: [birth year] DOA: 11/21/2021 PCP: Caprice Red, MD   Brief Narrative:  Jared White is a 76 y.o. male with PMH significant for HTN, HLD, CAD/stent/CABG, CHF, GERD, IgA nephropathy, CKD, rectal cancer status post radiation, neoadjuvant treatment, diverting ileostomy s/p reversal with recent anastomotic recurrence, chronic diarrhea, hypocalcemia.    Patient presented to the ED on 7/2 with complaint of constant nausea for the past 2 days leading to poor oral intake, increased weakness, impaired gait, decreased urine output. He has chronic diarrhea due to colon cancer, has low appetite and was only able to tolerate liquid diet.  He is on chronic calcium and magnesium supplementation.  For the past 2 to 3 weeks, patient has also noted right paralumbar pain worse with movement and better at rest.    In the ED, patient was afebrile, hemodynamically stable. Labs showed low serum calcium and magnesium level. He was noted to have urinary retention of more than 470 ml.  Foley catheter was inserted. Admitted to hospital service for further evaluation management.   Had episode of A-fib RVR on morning of 7/4, had extreme restlessness and agitation with altered mental status.  He was started on Cardizem drip.  He converted to normal sinus rhythm and was switched to oral Cardizem.  Went back into A-fib RVR and resumed on Cardizem drip.  Cardiology was consulted, started on Multaq.    Has also been having episodes of bloody bowel movements in setting of Eliquis use.  Eliquis is currently on hold.  Hemoglobin remains stable.    Assessment & Plan:   Principal Problem:   Hypocalcemia Active Problems:   CAD (coronary artery disease)   Essential hypertension   S/P CABG x 4   Hypomagnesemia   Rectal cancer (HCC)   Acute renal failure superimposed on stage 3b chronic kidney disease (HCC)   Hydroureteronephrosis   Lumbar back pain   Elevated troponin    Protein-calorie malnutrition, severe   PAF (paroxysmal atrial fibrillation) (HCC)  Acute on chronic hypocalcemia, hypomagnesemia -Has chronic low calcium and magnesium levels due to chronic diarrhea -Continue to monitor calcium and magnesium levels and replace accordingly.  A-fib with RVR, new onset with paroxysmal A-fib -Initially converted to normal sinus rhythm on Cardizem drip and subsequently switched to oral Cardizem.  Converted back into A-fib RVR and resumed back on Cardizem drip.  And subsequently transition to oral Cardizem on 11/28/2021.  Cardiology Dr. Bonney Roussel team on board. -CHA2DS2-VASc score 5, recommend anticoagulation.  Started on Eliquis, but this was discontinued in setting of worsening bloody bowel movements.  Patient reportedly has not had any further rectal bleeding and his hemoglobin has remained stable for last several days so he was restarted on heparin on 11/28/2021.  Cardiology recommends continuing Multaq.  Delirium and sundowning in setting of undiagnosed dementia Patient fully alert and oriented.  Brother-in-law at the bedside reports no further episodes of sundowning.  Continue current nightly Seroquel and as needed Haldol. 1. Avoid benzodiazepines, antihistamines, anticholinergics, and minimize opiate use as these may worsen delirium. 2: Assess, prevent and manage pain as lack of treatment can result in delirium.  3: Provide appropriate lighting and clear signage; a clock and calendar should be easily visible to the patient. 4: Monitor environmental factors. Reduce light and noise at night (close shades, turn off lights, turn off TV, ect). Correct any alterations in sleep cycle. 5: Reorient the patient to person, place, time and situation on each encounter.  6: Correct sensory deficits if possible (replace eye glasses, hearing aids, ect). 7: Avoid restraints if able. Severely delirious patients benefit from constant observation by a sitter.   History of rectal  cancer -3/21 rectal cancer diagnosed after seen colonoscopy.  Completed total neoadjuvant treatment with Dr. Tami Lin.  Finished radiation in 11/21.  Underwent APR on January 2022 with a diverting ileostomy.  May 2022, underwent ileostomy reversal.  Follow-up endoscopy recently showed an anastomotic polyp biopsy of which showed adenocarcinoma. -General surgery planned for permanent colostomy but the surgery had to be canceled twice.  First time was because of poor bowel prep and second time on 11/10/2021 was because of hypocalcemia.   -Per Dr. Marcello Moores, could consider a diverting ostomy in the hospital, if pt proceeding to a palliative situation -Currently abdominoperineal resection scheduled for 8/11 -Bloody bowel movement.  Repeat hemoglobin is stable    Pulmonary nodule -CT angio of chest showed slowly enlarging solid right upper lobe pulmonary nodule since last check, now 6 to 7 mm, this is more suspicious for bronchogenic carcinoma then solitary lung metastasis. -Appreciate pulmonology, planning for bronchoscopy tomorrow.  Patient's heparin needs to be stopped at 6 AM Tuesday morning, I have communicated with patient's primary nurse today who is going to pass this information to the night nurse.   Hypertension -Controlled, Cardizem   Chronic diarrhea/constipation -Questran   Demand ischemia -No symptoms of ACS -Aspirin on hold due to bloody bowel movement   HLD -Crestor    AKI on CKD stage II, history of IgA nephropathy -Baseline creatinine 0.9-1 -AKI resolved and chilly but creatinine now rising again.  Continue IV fluids.   Chronic L2 compression fracture -Reported several weeks of right paralumbar spinal pain worse with movement and better at rest -Lumbar spine x-ray showed chronic compression fracture deformity at the level of L2. Moderate to marked severity multilevel degenerative disc disease   Bilateral hydroureteronephrosis -CT abdomen noted bilateral hydroureteronephrosis  secondary to urinary retention likely at the level of bladder neck.   -Foley catheter was placed, plan for voiding trial prior to discharge   GERD -PPI  Anemia of chronic disease: Hemoglobin stable.  Chronic thrombocytopenia: Platelets stable.  No bleeding.   DVT prophylaxis: Place and maintain sequential compression device Start: 11/25/21 1228, heparin   Code Status: Full Code  Family Communication: Brother-in-law at bedside.  Plan of care discussed.  Status is: Inpatient Remains inpatient appropriate because: Scheduled for bronchoscopy tomorrow and then plan to discharge to SNF when bed available.   Estimated body mass index is 18.74 kg/m as calculated from the following:   Height as of this encounter: '5\' 8"'$  (1.727 m).   Weight as of this encounter: 55.9 kg.    Nutritional Assessment: Body mass index is 18.74 kg/m.Marland Kitchen Seen by dietician.  I agree with the assessment and plan as outlined below: Nutrition Status: Nutrition Problem: Severe Malnutrition Etiology: chronic illness (colon cancer) Signs/Symptoms: moderate fat depletion, severe fat depletion, severe muscle depletion, moderate muscle depletion Interventions: Boost Breeze, Magic cup, MVI  . Skin Assessment: I have examined the patient's skin and I agree with the wound assessment as performed by the wound care RN as outlined below:    Consultants:  Cardiology  Procedures:  None  Antimicrobials:  Anti-infectives (From admission, onward)    None         Subjective: Patient seen and examined.  He is fully alert and oriented.  No complaints.  Brother-in-law at the bedside.  Objective: Vitals:   11/28/21  2030 11/29/21 0015 11/29/21 0452 11/29/21 0714  BP:  137/60 (!) 106/55 (!) 151/67  Pulse: 75 99 71 99  Resp: '18 20 20 20  '$ Temp:  98.2 F (36.8 C) 98.2 F (36.8 C) 98.5 F (36.9 C)  TempSrc:  Oral Oral Oral  SpO2: 98% 99% 100% 98%  Weight:      Height:        Intake/Output Summary (Last 24  hours) at 11/29/2021 1037 Last data filed at 11/29/2021 0812 Gross per 24 hour  Intake 309.66 ml  Output 1450 ml  Net -1140.34 ml    Filed Weights   11/21/21 1616 11/22/21 0553  Weight: 58.1 kg 55.9 kg    Examination:  General exam: Appears calm and comfortable  Respiratory system: Clear to auscultation. Respiratory effort normal. Cardiovascular system: S1 & S2 heard, RRR. No JVD, murmurs, rubs, gallops or clicks. No pedal edema. Gastrointestinal system: Abdomen is nondistended, soft and nontender. No organomegaly or masses felt. Normal bowel sounds heard. Central nervous system: Alert and oriented. No focal neurological deficits. Extremities: Symmetric 5 x 5 power. Skin: No rashes, lesions or ulcers.  Psychiatry: Judgement and insight appear normal. Mood & affect appropriate.   Data Reviewed: I have personally reviewed following labs and imaging studies  CBC: Recent Labs  Lab 11/23/21 0143 11/24/21 0604 11/25/21 0850 11/26/21 0424 11/27/21 0354 11/29/21 0152  WBC 9.6 6.8 8.3 10.2 8.0 5.5  NEUTROABS 7.6 5.5  --   --   --   --   HGB 10.9* 9.9* 11.7* 11.2* 9.2* 9.4*  HCT 31.3* 29.1* 34.7* 33.8* 28.5* 28.3*  MCV 93.4 94.8 95.3 96.8 97.6 96.6  PLT 106* 88* 132* 139* 102* 112*    Basic Metabolic Panel: Recent Labs  Lab 11/23/21 0143 11/24/21 0604 11/25/21 0423 11/26/21 0424 11/27/21 0354 11/28/21 0536 11/29/21 0152  NA 139   < > 139 137 138 137 136  K 3.5   < > 4.2 4.5 4.5 3.8 4.1  CL 110   < > 110 107 112* 108 108  CO2 20*   < > 22 20* 21* 22 21*  GLUCOSE 93   < > 130* 155* 97 100* 116*  BUN 20   < > 17 26* '21 16 21  '$ CREATININE 0.98   < > 1.00 1.25* 1.12 1.15 1.58*  CALCIUM 7.0*   < > 8.5* 9.1 8.2* 8.5* 8.8*  MG 2.0  --  1.6* 1.7 2.0 1.6* 1.6*  PHOS 2.1*  --   --   --   --   --   --    < > = values in this interval not displayed.    GFR: Estimated Creatinine Clearance: 31.9 mL/min (A) (by C-G formula based on SCr of 1.58 mg/dL (H)). Liver Function  Tests: Recent Labs  Lab 11/23/21 0143 11/24/21 0604 11/25/21 0423  AST '31 27 25  '$ ALT '17 19 20  '$ ALKPHOS 58 59 62  BILITOT 0.8 0.7 0.8  PROT 5.5* 5.1* 6.0*  ALBUMIN 2.9* 2.7* 3.1*    No results for input(s): "LIPASE", "AMYLASE" in the last 168 hours. No results for input(s): "AMMONIA" in the last 168 hours. Coagulation Profile: No results for input(s): "INR", "PROTIME" in the last 168 hours. Cardiac Enzymes: No results for input(s): "CKTOTAL", "CKMB", "CKMBINDEX", "TROPONINI" in the last 168 hours. BNP (last 3 results) No results for input(s): "PROBNP" in the last 8760 hours. HbA1C: No results for input(s): "HGBA1C" in the last 72 hours. CBG: No results for input(s): "  GLUCAP" in the last 168 hours. Lipid Profile: No results for input(s): "CHOL", "HDL", "LDLCALC", "TRIG", "CHOLHDL", "LDLDIRECT" in the last 72 hours. Thyroid Function Tests: No results for input(s): "TSH", "T4TOTAL", "FREET4", "T3FREE", "THYROIDAB" in the last 72 hours. Anemia Panel: No results for input(s): "VITAMINB12", "FOLATE", "FERRITIN", "TIBC", "IRON", "RETICCTPCT" in the last 72 hours. Sepsis Labs: No results for input(s): "PROCALCITON", "LATICACIDVEN" in the last 168 hours.  No results found for this or any previous visit (from the past 240 hour(s)).   Radiology Studies: No results found.  Scheduled Meds:  calcium carbonate  400 mg of elemental calcium Oral Daily   Chlorhexidine Gluconate Cloth  6 each Topical Q0600   cholestyramine  4 g Oral BID   diltiazem  60 mg Oral Q8H   dronedarone  400 mg Oral BID WC   feeding supplement  1 Container Oral BID BM   feeding supplement  237 mL Oral BID BM   multivitamin with minerals  1 tablet Oral Daily   pantoprazole  40 mg Oral Daily   QUEtiapine  50 mg Oral QHS   rosuvastatin  20 mg Oral Daily   sodium chloride flush  10-40 mL Intracatheter Q12H   Continuous Infusions:  sodium chloride 100 mL/hr at 11/28/21 3335   heparin 750 Units/hr (11/29/21  0234)   magnesium sulfate bolus IVPB 2 g (11/29/21 1029)     LOS: 7 days   Darliss Cheney, MD [facility]  11/29/2021, 10:37 AM   *Please note that this is a verbal dictation therefore any spelling or grammatical errors are due to the "[place] [facility]" system interpretation.  Please page via [place] and do not message via secure chat for urgent patient care matters. Secure chat can be used for non urgent patient care matters.  How to contact the [facility] Attending or Consulting provider [place] or covering provider during after hours [place], for this patient?  Check the care team in [facility] and look for a) attending/consulting [facility] provider listed and b) the [facility] team listed. Page or secure chat [phone]. Log into [email or web address] and use [place]'s universal password to access. If you do not have the password, please contact the hospital operator. Locate the [facility] provider you are looking for under [facility] and page to a number that you can be directly reached. If you still have difficulty reaching the provider, please page the [facility] (Director on Call) for the Hospitalists listed on amion for assistance.

## 2021-11-29 NOTE — Progress Notes (Signed)
ANTICOAGULATION CONSULT NOTE   Pharmacy Consult for heparin  Indication: atrial fibrillation  No Known Allergies  Patient Measurements: Height: '5\' 8"'$  (172.7 cm) Weight: 55.9 kg (123 lb 3.8 oz) IBW/kg (Calculated) : 68.4   Vital Signs: Temp: 98.5 F (36.9 C) (07/10 0714) Temp Source: Oral (07/10 0714) BP: 151/67 (07/10 0714) Pulse Rate: 99 (07/10 0714)  Labs: Recent Labs    11/27/21 0354 11/28/21 0536 11/29/21 0152 11/29/21 1017  HGB 9.2*  --  9.4*  --   HCT 28.5*  --  28.3*  --   PLT 102*  --  112*  --   HEPARINUNFRC  --   --  0.62 0.77*  CREATININE 1.12 1.15 1.58*  --      Estimated Creatinine Clearance: 31.9 mL/min (A) (by C-G formula based on SCr of 1.58 mg/dL (H)).   Medical History: Past Medical History:  Diagnosis Date   Arthritis    Blood transfusion without reported diagnosis    Broken back    CAD (coronary artery disease)    PCI & CABG   Cataract    "LIGHT"-RIGHT   CHF (congestive heart failure) (HCC)    Diverticulitis    mild - approx 2004   Dysrhythmia    occasional arrythmias   Family history of heart disease    GERD (gastroesophageal reflux disease)    Heart murmur    HX OF YEARS AGO    History of kidney stones    History of nuclear stress test 06/30/2011   lexiscan; normal pattern of perfusion post-stress; low risk scan    Hyperlipidemia    Hypertension    IgA nephropathy    Irregular heart beat    Myocardial infarction ([place])    Pre-diabetes    Rectal cancer ([place]) 08/19/2019   Systemic hypertension     Medications:  Medications Prior to Admission  Medication Sig Dispense Refill Last Dose   AMLODIPINE BESYLATE PO Take 1 tablet by mouth 2 (two) times daily.   11/21/2021   Ascorbic Acid (VITAMIN C PO) Take 1 tablet by mouth daily.   11/21/2021   aspirin EC 81 MG tablet Take 1 tablet (81 mg total) by mouth daily. Swallow whole. (Patient taking differently: Take 162 mg by mouth daily.) 90 tablet 3 11/21/2021   atenolol (TENORMIN) 50 MG  tablet TAKE 1 TABLET BY MOUTH EVERY DAY (Patient taking differently: Take 50 mg by mouth daily.) 90 tablet 3 11/21/2021 at 08:00   b complex vitamins capsule Take 1 capsule by mouth daily.   11/21/2021   CALCIUM PO Take 1 tablet by mouth daily.   11/21/2021   Cholecalciferol (VITAMIN D-3 PO) Take 1 capsule by mouth daily.   11/21/2021   MAGNESIUM PO Take 1 tablet by mouth daily.   11/21/2021   nitroGLYCERIN (NITROSTAT) 0.4 MG SL tablet Place 0.4 mg under the tongue every 5 (five) minutes x 3 doses as needed for chest pain.   NEVER   omeprazole (PRILOSEC) 40 MG capsule TAKE 1 CAPSULE (40 MG TOTAL) BY MOUTH DAILY. 30 capsule 3 11/21/2021   rosuvastatin (CRESTOR) 20 MG tablet TAKE 1 TABLET BY MOUTH EVERY DAY (Patient taking differently: Take 20 mg by mouth daily.) 90 tablet 3 11/21/2021   valsartan (DIOVAN) 160 MG tablet TAKE 1 TABLET BY MOUTH EVERY DAY (Patient taking differently: Take 160 mg by mouth daily.) 90 tablet 3 11/21/2021   VITAMIN A PO Take 1 tablet by mouth daily.   11/21/2021   VITAMIN E PO Take 1  capsule by mouth daily at 6 (six) AM.   11/21/2021   Scheduled:   calcium carbonate  400 mg of elemental calcium Oral Daily   Chlorhexidine Gluconate Cloth  6 each Topical Q0600   cholestyramine  4 g Oral BID   diltiazem  60 mg Oral Q8H   dronedarone  400 mg Oral BID WC   feeding supplement  1 Container Oral BID BM   feeding supplement  237 mL Oral BID BM   multivitamin with minerals  1 tablet Oral Daily   pantoprazole  40 mg Oral Daily   QUEtiapine  50 mg Oral QHS   rosuvastatin  20 mg Oral Daily   sodium chloride flush  10-40 mL Intracatheter Q12H    Assessment: 76 yo male with afib/RVR and pharmacy consulted to dose heparin. He was on apixaban but this was discontinued due to bloody bowel movements which have resolved per chart notes.   Heparin level came back supratherapeutic at 0.77, on 750 units/hr. No s/sx of bleeding or infusion issues.   Goal of Therapy:  Heparin level 0.3-0.7  units/ml Monitor platelets by anticoagulation protocol: Yes   Plan:  -Reduce heparin infusion to 650 units/hr -Heparin level in 8 hours and daily wth CBC daily -Heparin is scheduled to stop on 7/11'@0600'$  for scheduled [place], PharmD, [place] Pharmacist  Phone: [phone] 11/29/2021 11:09 AM  Please check AMION for all [place] phone numbers After 10:00 PM, call [place] [phone]

## 2021-11-29 NOTE — Plan of Care (Signed)
  Problem: Clinical Measurements: Goal: Respiratory complications will improve Outcome: Progressing Goal: Cardiovascular complication will be avoided Outcome: Progressing   Problem: Coping: Goal: Level of anxiety will decrease Outcome: Progressing   Problem: Elimination: Goal: Will not experience complications related to urinary retention Outcome: Progressing   Problem: Elimination: Goal: Will not experience complications related to bowel motility Outcome: Not Progressing

## 2021-11-29 NOTE — Progress Notes (Signed)
Subjective:  Doing well. No complaints this morning. No bloody bowel movements last 2 days.  Heart rate well controlled.  Currently on IV heparin.  Pulmonology planning navigational bronchoscopy.  Objective:  Vital Signs in the last 24 hours: Temp:  [98.2 F (36.8 C)-98.7 F (37.1 C)] 98.5 F (36.9 C) (07/10 0714) Pulse Rate:  [71-99] 99 (07/10 0714) Resp:  [17-20] 20 (07/10 0714) BP: (106-151)/(55-67) 151/67 (07/10 0714) SpO2:  [91 %-100 %] 98 % (07/10 0714)  Intake/Output from previous day: 07/09 0701 - 07/10 0700 In: 69.7 [I.V.:69.7] Out: 1200 [Urine:1200]  Physical Exam Vitals and nursing note reviewed.  Constitutional:      General: He is not in acute distress.    Appearance: He is cachectic.  Neck:     Vascular: No JVD.  Cardiovascular:     Rate and Rhythm: Normal rate and regular rhythm.     Heart sounds: Normal heart sounds. No murmur heard. Pulmonary:     Effort: Pulmonary effort is normal.     Breath sounds: Normal breath sounds. No wheezing or rales.  Musculoskeletal:     Right lower leg: No edema.     Left lower leg: No edema.      Imaging/tests reviewed and independently interpreted: CBC, BMP, trop HS   Cardiac Studies:   Telemetry 11/29/2021: Maintaining sinus rhythm  EKG 11/25/2021: Afib with RVR 135 bpm Nonspecific ST-T abnormality   Echocardiogram 11/22/2021:  1. Left ventricular ejection fraction, by estimation, is 60 to 65%. The  left ventricle has normal function. The left ventricle has no regional  wall motion abnormalities. There is mild left ventricular hypertrophy.  Left ventricular diastolic parameters  are consistent with Grade I diastolic dysfunction (impaired relaxation).   2. Right ventricular systolic function is normal. The right ventricular  size is normal.   3. Left atrial size was moderately dilated.   4. The mitral valve is normal in structure. Mild mitral valve  regurgitation. No evidence of mitral stenosis.   5. The  aortic valve is tricuspid. Aortic valve regurgitation is not  visualized. Aortic valve sclerosis is present, with no evidence of aortic  valve stenosis.   6. Aortic dilatation noted. There is borderline dilatation of the  ascending aorta, measuring 39 mm.   7. The inferior vena cava is normal in size with greater than 50%  respiratory variability, suggesting right atrial pressure of 3 mmHg.      Assessment & Recommendations:  76 year old male with coronary artery disease status post CABG 2001 by Dr. Prescott Gum (LIMA-LAD, SVG-Diag, seg SVG-ramus & OM, SVG-PDA), hypertension, CKD III w/IgA nephropathy, hyperlipidemia, rectal mass s/p radiation, neoadjuvant treatment, diverting ileostomy s/p reversal with recent anastomotic recurrence, upcoming colostomy, paroxysmal A-fib, increasing in size pulmonary nodule, troponin elevation, hospital delirium with underlying dementia  Confusion: Now resolved  PAF: Maintaining sinus rhythm on Multaq 400 mg twice daily.   Given his upcoming bronchoscopy and possible colostomy, I would like to continue Multaq at least in the short-term to maintain sinus rhythm and avoid periprocedural A-fib with RVR. CHA2DS2VASc score at least 5, annual stroke risk >7%.  Currently on IV heparin with stable hemoglobin last 2 days, although being clinically with hemoglobin of 9. I remain concerned about his risk of bleeding given his anatomic recurrence of local rectal cancer. Consider input from surgery regarding his bleeding risk going forward with being on Eliquis for outpatient maintenance of anticoagulation.   Troponin elevation: No chest pain or acute ischemic changes. Likely type 2 MI  in the setting of bleeding, Afib RVR, dehydration. Unable to use Aspirin due to bleeding. He is now on IV heparin.  Continue close monitoring of H/H.  Cardiac risk stratification: I believe his tachycardia few days ago was all in the setting of hospital delirium and not necessarily due  to any underlying sepsis or active bleeding. Given the concern for increasing size pulmonary nodule and urgency of diagnostic testing, okay to proceed with navigational bronchoscopy. Similarly, okay to proceed with upcoming colostomy, accepting elevated cardiac risk. His cardiac risk is unlikely to be mitigated by any ischemia work-up at this time.  Rest of the management as per the primary team    Discussed interpretation of tests and management recommendations with the primary team     Nigel Mormon, MD Pager: [phone] Office: [phone]

## 2021-11-29 NOTE — Anesthesia Preprocedure Evaluation (Signed)
Anesthesia Evaluation  Patient identified by MRN, date of birth, ID band Patient awake    Reviewed: Allergy & Precautions, NPO status , Patient's Chart, lab work & pertinent test results  Airway Mallampati: II  TM Distance: >3 FB Neck ROM: Full    Dental no notable dental hx. (+) Missing, Dental Advisory Given,    Pulmonary neg pulmonary ROS,    Pulmonary exam normal breath sounds clear to auscultation       Cardiovascular hypertension, + CAD, + CABG (S/P CABG) and +CHF  Normal cardiovascular exam+ dysrhythmias  Rhythm:Regular Rate:Normal  Echo 11/22/21 1. Left ventricular ejection fraction, by estimation, is 60 to 65%. The  left ventricle has normal function. The left ventricle has no regional  wall motion abnormalities. There is mild left ventricular hypertrophy.  Left ventricular diastolic parameters  are consistent with Grade I diastolic dysfunction (impaired relaxation).  2. Right ventricular systolic function is normal. The right ventricular  size is normal.  3. Left atrial size was moderately dilated.  4. The mitral valve is normal in structure. Mild mitral valve  regurgitation. No evidence of mitral stenosis.  5. The aortic valve is tricuspid. Aortic valve regurgitation is not  visualized. Aortic valve sclerosis is present, with no evidence of aortic  valve stenosis.  6. Aortic dilatation noted. There is borderline dilatation of the  ascending aorta, measuring 39 mm.  7. The inferior vena cava is normal in size with greater than 50%  respiratory variability, suggesting right atrial pressure of 3 mmHg.    Neuro/Psych negative neurological ROS     GI/Hepatic GERD  ,Hx of rectalca   Endo/Other    Renal/GU Renal InsufficiencyRenal diseaseLab Results      Component                Value               Date                      CREATININE               1.58 (H)            11/29/2021       K                         4.1                 11/29/2021                   Musculoskeletal  (+) Arthritis ,   Abdominal   Peds  Hematology  (+) Blood dyscrasia, anemia , Lab Results      Component                Value               Date                      WBC                      5.5                 11/29/2021                HGB                      9.4 (  L)             11/29/2021                HCT                      28.3 (L)            11/29/2021                MCV                      96.6                11/29/2021                PLT                      112 (L)             11/29/2021              Anesthesia Other Findings   Reproductive/Obstetrics                            Anesthesia Physical Anesthesia Plan  ASA: 3  Anesthesia Plan: General   Post-op Pain Management:    Induction: Intravenous  PONV Risk Score and Plan: Treatment may vary due to age or medical condition  Airway Management Planned: Oral ETT  Additional Equipment: None  Intra-op Plan:   Post-operative Plan: Extubation in OR  Informed Consent:     Dental advisory given  Plan Discussed with:   Anesthesia Plan Comments:         Anesthesia Quick Evaluation

## 2021-11-29 NOTE — Progress Notes (Signed)
ANTICOAGULATION CONSULT NOTE   Pharmacy Consult for heparin  Indication: atrial fibrillation  No Known Allergies  Patient Measurements: Height: '5\' 8"'$  (172.7 cm) Weight: 55.9 kg (123 lb 3.8 oz) IBW/kg (Calculated) : 68.4   Vital Signs: Temp: 98.2 F (36.8 C) (07/10 0015) Temp Source: Oral (07/10 0015) BP: 137/60 (07/10 0015) Pulse Rate: 99 (07/10 0015)  Labs: Recent Labs    11/26/21 0424 11/27/21 0354 11/28/21 0536 11/29/21 0152  HGB 11.2* 9.2*  --  9.4*  HCT 33.8* 28.5*  --  28.3*  PLT 139* 102*  --  112*  HEPARINUNFRC  --   --   --  0.62  CREATININE 1.25* 1.12 1.15  --      Estimated Creatinine Clearance: 43.9 mL/min (by C-G formula based on SCr of 1.15 mg/dL).   Medical History: Past Medical History:  Diagnosis Date   Arthritis    Blood transfusion without reported diagnosis    Broken back    CAD (coronary artery disease)    PCI & CABG   Cataract    "LIGHT"-RIGHT   CHF (congestive heart failure) (HCC)    Diverticulitis    mild - approx 2004   Dysrhythmia    occasional arrythmias   Family history of heart disease    GERD (gastroesophageal reflux disease)    Heart murmur    HX OF YEARS AGO    History of kidney stones    History of nuclear stress test 06/30/2011   lexiscan; normal pattern of perfusion post-stress; low risk scan    Hyperlipidemia    Hypertension    IgA nephropathy    Irregular heart beat    Myocardial infarction ([place])    Pre-diabetes    Rectal cancer ([place]) 08/19/2019   Systemic hypertension     Medications:  Medications Prior to Admission  Medication Sig Dispense Refill Last Dose   AMLODIPINE BESYLATE PO Take 1 tablet by mouth 2 (two) times daily.   11/21/2021   Ascorbic Acid (VITAMIN C PO) Take 1 tablet by mouth daily.   11/21/2021   aspirin EC 81 MG tablet Take 1 tablet (81 mg total) by mouth daily. Swallow whole. (Patient taking differently: Take 162 mg by mouth daily.) 90 tablet 3 11/21/2021   atenolol (TENORMIN) 50 MG tablet  TAKE 1 TABLET BY MOUTH EVERY DAY (Patient taking differently: Take 50 mg by mouth daily.) 90 tablet 3 11/21/2021 at 08:00   b complex vitamins capsule Take 1 capsule by mouth daily.   11/21/2021   CALCIUM PO Take 1 tablet by mouth daily.   11/21/2021   Cholecalciferol (VITAMIN D-3 PO) Take 1 capsule by mouth daily.   11/21/2021   MAGNESIUM PO Take 1 tablet by mouth daily.   11/21/2021   nitroGLYCERIN (NITROSTAT) 0.4 MG SL tablet Place 0.4 mg under the tongue every 5 (five) minutes x 3 doses as needed for chest pain.   NEVER   omeprazole (PRILOSEC) 40 MG capsule TAKE 1 CAPSULE (40 MG TOTAL) BY MOUTH DAILY. 30 capsule 3 11/21/2021   rosuvastatin (CRESTOR) 20 MG tablet TAKE 1 TABLET BY MOUTH EVERY DAY (Patient taking differently: Take 20 mg by mouth daily.) 90 tablet 3 11/21/2021   valsartan (DIOVAN) 160 MG tablet TAKE 1 TABLET BY MOUTH EVERY DAY (Patient taking differently: Take 160 mg by mouth daily.) 90 tablet 3 11/21/2021   VITAMIN A PO Take 1 tablet by mouth daily.   11/21/2021   VITAMIN E PO Take 1 capsule by mouth daily at 6 (  six) AM.   11/21/2021   Scheduled:   calcium carbonate  400 mg of elemental calcium Oral Daily   Chlorhexidine Gluconate Cloth  6 each Topical Q0600   cholestyramine  4 g Oral BID   diltiazem  60 mg Oral Q8H   dronedarone  400 mg Oral BID WC   feeding supplement  1 Container Oral BID BM   feeding supplement  237 mL Oral BID BM   multivitamin with minerals  1 tablet Oral Daily   pantoprazole  40 mg Oral Daily   QUEtiapine  50 mg Oral QHS   rosuvastatin  20 mg Oral Daily   sodium chloride flush  10-40 mL Intracatheter Q12H    Assessment: 76 yo male with afib/RVR and pharmacy consulted to dose heparin. He was on apixaban but this was discontinued due to bloody bowel movements which have resolved per chart notes.  -hg= 9.4 (range has been 9.9-11.7 recently) -Last apixaban dose given 7/5 ~ 10am  Heparin level therapeutic: 0.62, no issues with infusion or overt s/sx of bleeding per  RN  Goal of Therapy:  Heparin level 0.3-0.7 units/ml Monitor platelets by anticoagulation protocol: Yes   Plan:  -No heparin bolus due to recent bleeding -Continue heparin at 750 units/hr -Heparin level in 8 hours and daily wth CBC daily  Georga Bora, PharmD Clinical Pharmacist 11/29/2021 2:24 AM Please check AMION for all [place] numbers

## 2021-11-30 ENCOUNTER — Inpatient Hospital Stay (HOSPITAL_COMMUNITY): Payer: Medicare Other | Admitting: Anesthesiology

## 2021-11-30 ENCOUNTER — Inpatient Hospital Stay (HOSPITAL_COMMUNITY): Payer: Medicare Other

## 2021-11-30 ENCOUNTER — Encounter (HOSPITAL_COMMUNITY)
Admission: EM | Disposition: A | Discharge status: Skilled Nursing Facility | Payer: Self-pay | Source: Home / Self Care | Attending: Family Medicine

## 2021-11-30 ENCOUNTER — Ambulatory Visit (HOSPITAL_COMMUNITY): Admit: 2021-11-30 | Payer: Medicare Other | Admitting: Pulmonary Disease

## 2021-11-30 ENCOUNTER — Encounter (HOSPITAL_COMMUNITY): Payer: Self-pay | Admitting: Family Medicine

## 2021-11-30 DIAGNOSIS — I509 Heart failure, unspecified: Secondary | ICD-10-CM | POA: Diagnosis not present

## 2021-11-30 DIAGNOSIS — I2510 Atherosclerotic heart disease of native coronary artery without angina pectoris: Secondary | ICD-10-CM

## 2021-11-30 DIAGNOSIS — I110 Hypertensive heart disease with heart failure: Secondary | ICD-10-CM

## 2021-11-30 DIAGNOSIS — R911 Solitary pulmonary nodule: Secondary | ICD-10-CM | POA: Diagnosis not present

## 2021-11-30 HISTORY — PX: BRONCHIAL NEEDLE ASPIRATION BIOPSY: SHX5106

## 2021-11-30 HISTORY — PX: BRONCHIAL BIOPSY: SHX5109

## 2021-11-30 HISTORY — PX: FIDUCIAL MARKER PLACEMENT: SHX6858

## 2021-11-30 LAB — CBC
HCT: 28.4 % — ABNORMAL LOW (ref 39.0–52.0)
Hemoglobin: 9.6 g/dL — ABNORMAL LOW (ref 13.0–17.0)
MCH: 32.3 pg (ref 26.0–34.0)
MCHC: 33.8 g/dL (ref 30.0–36.0)
MCV: 95.6 fL (ref 80.0–100.0)
Platelets: 135 10*3/uL — ABNORMAL LOW (ref 150–400)
RBC: 2.97 MIL/uL — ABNORMAL LOW (ref 4.22–5.81)
RDW: 13.4 % (ref 11.5–15.5)
WBC: 5.6 10*3/uL (ref 4.0–10.5)
nRBC: 0 % (ref 0.0–0.2)

## 2021-11-30 LAB — BASIC METABOLIC PANEL
Anion gap: 8 (ref 5–15)
BUN: 22 mg/dL (ref 8–23)
CO2: 24 mmol/L (ref 22–32)
Calcium: 9 mg/dL (ref 8.9–10.3)
Chloride: 105 mmol/L (ref 98–111)
Creatinine, Ser: 1.36 mg/dL — ABNORMAL HIGH (ref 0.61–1.24)
GFR, Estimated: 54 mL/min — ABNORMAL LOW (ref >60)
Glucose, Bld: 92 mg/dL (ref 70–99)
Potassium: 4.1 mmol/L (ref 3.5–5.1)
Sodium: 137 mmol/L (ref 135–145)

## 2021-11-30 LAB — HEPARIN LEVEL (UNFRACTIONATED)
Heparin Unfractionated: >1.1 IU/mL — ABNORMAL HIGH (ref 0.30–0.70)
Heparin Unfractionated: >1.1 IU/mL — ABNORMAL HIGH (ref 0.30–0.70)

## 2021-11-30 LAB — MAGNESIUM: Magnesium: 1.7 mg/dL (ref 1.7–2.4)

## 2021-11-30 LAB — SARS CORONAVIRUS 2 BY RT PCR: SARS Coronavirus 2 by RT PCR: NEGATIVE

## 2021-11-30 SURGERY — BRONCHOSCOPY, WITH BIOPSY USING ELECTROMAGNETIC NAVIGATION
Anesthesia: General | Laterality: Right

## 2021-11-30 MED ORDER — HEPARIN (PORCINE) 25000 UT/250ML-% IV SOLN
600.0000 [IU]/h | INTRAVENOUS | Status: DC
  Administered 2021-11-30: 600 [IU]/h via INTRAVENOUS

## 2021-11-30 MED ORDER — FENTANYL CITRATE (PF) 100 MCG/2ML IJ SOLN
INTRAMUSCULAR | Status: DC | PRN
  Administered 2021-11-30: 50 ug via INTRAVENOUS

## 2021-11-30 MED ORDER — ROCURONIUM BROMIDE 100 MG/10ML IV SOLN
INTRAVENOUS | Status: DC | PRN
  Administered 2021-11-30: 40 mg via INTRAVENOUS

## 2021-11-30 MED ORDER — LACTATED RINGERS IV SOLN: INTRAVENOUS | Status: DC

## 2021-11-30 MED ORDER — HEPARIN (PORCINE) 25000 UT/250ML-% IV SOLN
500.0000 [IU]/h | INTRAVENOUS | Status: DC
Start: 2021-12-01 — End: 2021-12-01
  Administered 2021-12-01: 500 [IU]/h via INTRAVENOUS

## 2021-11-30 MED ORDER — ONDANSETRON HCL 4 MG/2ML IJ SOLN: 4.0000 mg | Freq: Once | INTRAMUSCULAR | Status: DC | PRN

## 2021-11-30 MED ORDER — SUGAMMADEX SODIUM 200 MG/2ML IV SOLN
INTRAVENOUS | Status: DC | PRN
  Administered 2021-11-30: 150 mg via INTRAVENOUS

## 2021-11-30 MED ORDER — LIDOCAINE HCL (CARDIAC) PF 100 MG/5ML IV SOSY
PREFILLED_SYRINGE | INTRAVENOUS | Status: DC | PRN
  Administered 2021-11-30: 60 mg via INTRATRACHEAL

## 2021-11-30 MED ORDER — ACETAMINOPHEN 10 MG/ML IV SOLN: 1000.0000 mg | Freq: Once | INTRAVENOUS | Status: DC | PRN

## 2021-11-30 MED ORDER — FENTANYL CITRATE (PF) 100 MCG/2ML IJ SOLN: 25.0000 ug | INTRAMUSCULAR | Status: DC | PRN

## 2021-11-30 MED ORDER — MAGNESIUM SULFATE 2 GM/50ML IV SOLN
2.0000 g | Freq: Once | INTRAVENOUS | Status: AC
Start: 2021-11-30 — End: 2021-11-30
  Administered 2021-11-30: 2 g via INTRAVENOUS
  Filled 2021-11-30: qty 50

## 2021-11-30 MED ORDER — CHLORHEXIDINE GLUCONATE 0.12 % MT SOLN
15.0000 mL | Freq: Once | OROMUCOSAL | Status: AC
Start: 2021-11-30 — End: 2021-11-30
  Administered 2021-11-30: 15 mL via OROMUCOSAL
  Filled 2021-11-30: qty 15

## 2021-11-30 MED ORDER — EPHEDRINE SULFATE-NACL 50-0.9 MG/10ML-% IV SOSY
PREFILLED_SYRINGE | INTRAVENOUS | Status: DC | PRN
  Administered 2021-11-30 (×2): 5 mg via INTRAVENOUS

## 2021-11-30 MED ORDER — DEXAMETHASONE SODIUM PHOSPHATE 10 MG/ML IJ SOLN
INTRAMUSCULAR | Status: DC | PRN
  Administered 2021-11-30: 5 mg via INTRAVENOUS

## 2021-11-30 MED ORDER — ONDANSETRON HCL 4 MG/2ML IJ SOLN
INTRAMUSCULAR | Status: DC | PRN
  Administered 2021-11-30: 4 mg via INTRAVENOUS

## 2021-11-30 MED ORDER — PHENYLEPHRINE HCL-NACL 20-0.9 MG/250ML-% IV SOLN
INTRAVENOUS | Status: DC | PRN
  Administered 2021-11-30: 30 ug/min via INTRAVENOUS

## 2021-11-30 MED ORDER — PROPOFOL 10 MG/ML IV BOLUS
INTRAVENOUS | Status: DC | PRN
  Administered 2021-11-30: 100 mg via INTRAVENOUS

## 2021-11-30 SURGICAL SUPPLY — 1 items: Superlock fiducial marker ×1 IMPLANT

## 2021-11-30 NOTE — Transfer of Care (Signed)
Immediate Anesthesia Transfer of Care Note  Patient: Jared White  Procedure(s) Performed: ROBOTIC ASSISTED NAVIGATIONAL BRONCHOSCOPY (Right) BRONCHIAL NEEDLE ASPIRATION BIOPSIES BRONCHIAL BIOPSIES FIDUCIAL MARKER PLACEMENT  Patient Location: PACU  Anesthesia Type:General  Level of Consciousness: awake, drowsy and patient cooperative  Airway & Oxygen Therapy: Patient Spontanous Breathing and Patient connected to face mask oxygen  Post-op Assessment: Report given to RN, Post -op Vital signs reviewed and stable and Patient moving all extremities X 4  Post vital signs: Reviewed and stable  Last Vitals:  Vitals Value Taken Time  BP 117/57 11/30/21 1047  Temp    Pulse 73 11/30/21 1049  Resp 18 11/30/21 1049  SpO2 99 % 11/30/21 1049  Vitals shown include unvalidated device data.  Last Pain:  Vitals:   11/30/21 0840  TempSrc: Oral  PainSc:       Patients Stated Pain Goal: 3 (16/38/45 3646)  Complications: No notable events documented.

## 2021-11-30 NOTE — Anesthesia Postprocedure Evaluation (Signed)
Anesthesia Post Note  Patient: Jared White  Procedure(s) Performed: ROBOTIC ASSISTED NAVIGATIONAL BRONCHOSCOPY (Right) BRONCHIAL NEEDLE ASPIRATION BIOPSIES BRONCHIAL BIOPSIES FIDUCIAL MARKER PLACEMENT     Patient location during evaluation: PACU Anesthesia Type: General Level of consciousness: awake Pain management: pain level controlled Vital Signs Assessment: post-procedure vital signs reviewed and stable Respiratory status: spontaneous breathing, nonlabored ventilation, respiratory function stable and patient connected to nasal cannula oxygen Cardiovascular status: blood pressure returned to baseline and stable Postop Assessment: no apparent nausea or vomiting Anesthetic complications: no   No notable events documented.  Last Vitals:  Vitals:   11/30/21 1120 11/30/21 1204  BP: (!) 117/54 134/60  Pulse: 75 81  Resp: 17 19  Temp: 37.1 C (!) 36.4 C  SpO2: 96% 97%    Last Pain:  Vitals:   11/30/21 1204  TempSrc: Oral  PainSc:                  Chase Arnall P Zissy Hamlett

## 2021-11-30 NOTE — Progress Notes (Signed)
Subjective:  Doing well. Underwent bronchoscopy   Objective:  Vital Signs in the last 24 hours: Temp:  [97.5 F (36.4 C)-98.7 F (37.1 C)] 97.5 F (36.4 C) (07/11 1204) Pulse Rate:  [69-97] 81 (07/11 1204) Resp:  [14-20] 19 (07/11 1204) BP: (112-135)/(54-78) 134/60 (07/11 1204) SpO2:  [96 %-100 %] 97 % (07/11 1204) Weight:  [55.9 kg] 55.9 kg (07/11 0900)  Intake/Output from previous day: 07/10 0701 - 07/11 0700 In: 480 [P.O.:480] Out: 2400 [Urine:2400]  Physical Exam Vitals and nursing note reviewed.  Constitutional:      General: He is not in acute distress.    Appearance: He is cachectic.  Neck:     Vascular: No JVD.  Cardiovascular:     Rate and Rhythm: Normal rate and regular rhythm.     Heart sounds: Normal heart sounds. No murmur heard. Pulmonary:     Effort: Pulmonary effort is normal.     Breath sounds: Normal breath sounds. No wheezing or rales.  Musculoskeletal:     Right lower leg: No edema.     Left lower leg: No edema.      Imaging/tests reviewed and independently interpreted: CBC, BMP, trop HS   Cardiac Studies:   Telemetry 11/30/2021: Maintaining sinus rhythm/ sinus tachycardia  EKG 11/25/2021: Afib with RVR 135 bpm Nonspecific ST-T abnormality   Echocardiogram 11/22/2021:  1. Left ventricular ejection fraction, by estimation, is 60 to 65%. The  left ventricle has normal function. The left ventricle has no regional  wall motion abnormalities. There is mild left ventricular hypertrophy.  Left ventricular diastolic parameters  are consistent with Grade I diastolic dysfunction (impaired relaxation).   2. Right ventricular systolic function is normal. The right ventricular  size is normal.   3. Left atrial size was moderately dilated.   4. The mitral valve is normal in structure. Mild mitral valve  regurgitation. No evidence of mitral stenosis.   5. The aortic valve is tricuspid. Aortic valve regurgitation is not  visualized. Aortic valve  sclerosis is present, with no evidence of aortic  valve stenosis.   6. Aortic dilatation noted. There is borderline dilatation of the  ascending aorta, measuring 39 mm.   7. The inferior vena cava is normal in size with greater than 50%  respiratory variability, suggesting right atrial pressure of 3 mmHg.      Assessment & Recommendations:  76 year old male with coronary artery disease status post CABG 2001 by Dr. Prescott Gum (LIMA-LAD, SVG-Diag, seg SVG-ramus & OM, SVG-PDA), hypertension, CKD III w/IgA nephropathy, hyperlipidemia, rectal mass s/p radiation, neoadjuvant treatment, diverting ileostomy s/p reversal with recent anastomotic recurrence, upcoming diverting ostomy, paroxysmal A-fib, increasing in size pulmonary nodule, troponin elevation, hospital delirium with underlying dementia  PAF: Maintaining sinus rhythm on Multaq 400 mg twice daily.   Given his upcoming possible diverting ostomy, I would like to continue Multaq at least in the short-term to maintain sinus rhythm and avoid periprocedural A-fib with RVR. CHA2DS2VASc score at least 5, annual stroke risk >7%.  Currently on IV heparin with stable hemoglobin last few days, although remains anemic with hemoglobin in 9s. I remain concerned about his risk of bleeding given his anatomic recurrence of local rectal cancer. Consider input from surgery regarding his bleeding risk going forward with being on Eliquis for outpatient maintenance of anticoagulation.   Troponin elevation: No chest pain or acute ischemic changes. Likely type 2 MI in the setting of bleeding, Afib RVR, dehydration. Unable to use Aspirin due to bleeding. He is  now on IV heparin.  Continue close monitoring of H/H.  Cardiac risk stratification: Recent troponin elevation in the setting of diarrhea Afib w/RVR without clinical symptoms of ACS, likely type 2 MI. Perioperative cardiac risk elevated, but unlikely to be mitigated by further ischemic workup. I will aim  to maintain sinus rhythm by using Multaq 400 mg bid. Reasonable to proceed with surger, accepting elevated cardiac risk.  Cardiology will sign off. Please call us back in case of any questions. Will arrange outpatient follow up.  Rest of the management as per the primary team    Discussed interpretation of tests and management recommendations with the primary team     Nigel Mormon, MD Pager: [phone] Office: [phone]

## 2021-11-30 NOTE — Progress Notes (Signed)
Patient needs Covid test prior to bronchoscopy today per anesthesia protocol. Order placed and Onyea, floor RN made aware to collect sample.

## 2021-11-30 NOTE — Progress Notes (Signed)
Spoke with Elmyra Ricks, floor RN regarding need for Covid test. RN stated she did not have a testing kit. Kit tubed to station 28. RN to collect sample at this time.

## 2021-11-30 NOTE — Anesthesia Procedure Notes (Signed)
Procedure Name: Intubation Date/Time: 11/30/2021 9:55 AM  Performed by: Gaylene Brooks, CRNAPre-anesthesia Checklist: Patient identified, Emergency Drugs available, Suction available and Patient being monitored Patient Re-evaluated:Patient Re-evaluated prior to induction Oxygen Delivery Method: Circle System Utilized Preoxygenation: Pre-oxygenation with 100% oxygen Induction Type: IV induction Ventilation: Mask ventilation without difficulty Laryngoscope Size: Miller and 2 Grade View: Grade I Tube type: Oral Tube size: 8.5 mm Number of attempts: 1 Airway Equipment and Method: Stylet and Oral airway Placement Confirmation: ETT inserted through vocal cords under direct vision, positive ETCO2 and breath sounds checked- equal and bilateral Secured at: 23 cm Tube secured with: Tape Dental Injury: Teeth and Oropharynx as per pre-operative assessment

## 2021-11-30 NOTE — Op Note (Addendum)
Video Bronchoscopy with Robotic Assisted Bronchoscopic Navigation   Date of Operation: 11/30/2021   Pre-op Diagnosis: Lung nodule   Post-op Diagnosis: Lung nodule   Surgeon: Garner Nash, DO   Assistants: None   Anesthesia: General endotracheal anesthesia  Operation: Flexible video fiberoptic bronchoscopy with robotic assistance and biopsies.  Estimated Blood Loss: Minimal  Complications: None  Indications and History: Jared White is a 76 y.o. male with history of lung nodule. The risks, benefits, complications, treatment options and expected outcomes were discussed with the patient.  The possibilities of pneumothorax, pneumonia, reaction to medication, pulmonary aspiration, perforation of a viscus, bleeding, failure to diagnose a condition and creating a complication requiring transfusion or operation were discussed with the patient who freely signed the consent.    Description of Procedure: The patient was seen in the Preoperative Area, was examined and was deemed appropriate to proceed.  The patient was taken to [facility] endoscopy room 3, identified as [name] and the procedure verified as Flexible Video Fiberoptic Bronchoscopy.  A Time Out was held and the above information confirmed.   Prior to the date of the procedure a high-resolution CT scan of the chest was performed. Utilizing ION software program a virtual tracheobronchial tree was generated to allow the creation of distinct navigation pathways to the patient's parenchymal abnormalities. After being taken to the operating room general anesthesia was initiated and the patient  was orally intubated. The video fiberoptic bronchoscope was introduced via the endotracheal tube and a general inspection was performed which showed normal right and left lung anatomy, aspiration of the bilateral mainstems was completed to remove any remaining secretions. Robotic catheter inserted into patient's endotracheal tube.   Target #1 RUL: The  distinct navigation pathways prepared prior to this procedure were then utilized to navigate to patient's lesion identified on CT scan. The robotic catheter was secured into place and the vision probe was withdrawn.  Lesion location was approximated using fluoroscopy and 3D CBCT for peripheral targeting. Under fluoroscopic guidance transbronchial needle biopsies, and transbronchial forceps biopsies were performed to be sent for cytology and pathology.  Following tissue sampling a single fiducial was placed under fluoroscopic guidance using the fiducial catheter wire and delivery kit.  At the end of the procedure a general airway inspection was performed and there was no evidence of active bleeding. The bronchoscope was removed.  The patient tolerated the procedure well. There was no significant blood loss and there were no obvious complications. A post-procedural chest x-ray is pending.  Samples Target #1: 1. Transbronchial Wang needle biopsies from RUL 2. Transbronchial forceps biopsies from RUL  Plans:  The patient will be discharged from the PACU to home when recovered from anesthesia and after chest x-ray is reviewed. We will review the cytology, pathology results with the patient when they become available. Outpatient followup will be with Garner Nash, DO.    Garner Nash, DO [facility] 11/30/2021 10:58 AM

## 2021-11-30 NOTE — Progress Notes (Signed)
Physical Therapy Treatment Patient Details Name: Jared White MRN: [id number] DOB: Oct 27, 1945 Today's Date: 11/30/2021   History of Present Illness Pt is a 76 y.o. male admitted 11/21/21 with increasing weakness; workup for AKI and hypocalcemia in the setting of bladder outlet obstruction. New Afib 7/4; also with episodes of AMS. Chest CTA 7/4 with slowly enlarging RUL nodule. Awaiting bronchoscopy for RUL nodule. Of note, pt awaiting permanent colostomy sx that has been delayed. Other PMH includes rectal CA s/p radiation with diverting ileostomy s/p reversal, chronic diarrhea, hypocalcemia, CAD s/p CABG, CKD, HTN, HLD, tobacco use.    PT Comments    Pt received supine and eager for mobility with continued focus on progression of safe mobility. Pt able to demonstrate increased stability with transfers needing cues for hand placement x1 and ability to rise with min guard for safety and improved balance without LOB or posterior lean. Pt with increased tolerance for ambulation this session, however continues to demonstrate decreased safety with RW drifting R/L and difficulty with foot clearance throughout, pt with LOB x3 with min assist needed to correct, especially with turning despite cues to slow and stay inside RW. Current plan remains appropriate to address deficits and maximize functional independence and decrease caregiver burden. Pt continues to benefit from skilled PT services to progress toward functional mobility goals.    Recommendations for follow up therapy are one component of a multi-disciplinary discharge planning process, led by the attending physician.  Recommendations may be updated based on patient status, additional functional criteria and insurance authorization.  Follow Up Recommendations  Skilled nursing-short term rehab (<3 hours/day) Can patient physically be transported by private vehicle: Yes   Assistance Recommended at Discharge Frequent or constant Supervision/Assistance   Patient can return home with the following A lot of help with bathing/dressing/bathroom;Assist for transportation;Direct supervision/assist for medications management;Direct supervision/assist for financial management;Assistance with cooking/housework;Help with stairs or ramp for entrance;A little help with walking and/or transfers   Equipment Recommendations  Rolling walker (2 wheels);BSC/3in1    Recommendations for Other Services       Precautions / Restrictions Precautions Precautions: Fall Restrictions Weight Bearing Restrictions: No     Mobility  Bed Mobility Overal bed mobility: Needs Assistance Bed Mobility: Supine to Sit     Supine to sit: Min assist, HOB elevated (HOB 15)     General bed mobility comments: MinA for trunk elevation, pt able to scoot hips to EOB without assist; increased time and effort    Transfers Overall transfer level: Needs assistance Equipment used: Rolling walker (2 wheels) Transfers: Sit to/from Stand Sit to Stand: Min assist           General transfer comment: min assist to steady on rise, pt needing x1 cues for hand placement with good follow through, steady rise from EOB    Ambulation/Gait Ambulation/Gait assistance: Min assist Gait Distance (Feet): 160 Feet Assistive device: Rolling walker (2 wheels) Gait Pattern/deviations: Step-through pattern, Decreased stride length, Shuffle, Trunk flexed Gait velocity: Decreased     General Gait Details: slow unsteady gait starting with shuffling steps with improved foot clearance with cues and increased distance, pt needing cues for upright posture and to maintain center of hall as pt drifting R. LOB x3 throughout with increased instance when turning   Stairs             Wheelchair Mobility    Modified Rankin (Stroke Patients Only)       Balance Overall balance assessment: Needs assistance Sitting-balance support: No  upper extremity supported, Feet supported Sitting  balance-Leahy Scale: Fair Sitting balance - Comments: min assist for static sitting Postural control: Posterior lean Standing balance support: Bilateral upper extremity supported, During functional activity, Reliant on assistive device for balance Standing balance-Leahy Scale: Poor Standing balance comment: reliant on external assist to maintain balance when pt letting go of RW                            Cognition Arousal/Alertness: Awake/alert Behavior During Therapy: WFL for tasks assessed/performed, Flat affect Overall Cognitive Status: Impaired/Different from baseline                       Memory: Decreased short-term memory Following Commands: Follows one step commands with increased time Safety/Judgement: Decreased awareness of safety, Decreased awareness of deficits              Exercises Other Exercises Other Exercises: marching in standing x20    General Comments General comments (skin integrity, edema, etc.): HR up to 111bpm during ambulation      Pertinent Vitals/Pain Pain Assessment Pain Assessment: Faces Faces Pain Scale: Hurts a little bit Pain Location: "my rectum" Pain Descriptors / Indicators: Discomfort Pain Intervention(s): Monitored during session, Repositioned    Home Living                          Prior Function            PT Goals (current goals can now be found in the care plan section) Acute Rehab PT Goals Patient Stated Goal: be able to return home, work on my cars PT Goal Formulation: With patient/family Time For Goal Achievement: 12/07/21    Frequency    Min 3X/week      PT Plan Current plan remains appropriate    Co-evaluation              AM-PAC PT "6 Clicks" Mobility   Outcome Measure  Help needed turning from your back to your side while in a flat bed without using bedrails?: A Little Help needed moving from lying on your back to sitting on the side of a flat bed without using  bedrails?: A Little Help needed moving to and from a bed to a chair (including a wheelchair)?: A Little Help needed standing up from a chair using your arms (e.g., wheelchair or bedside chair)?: A Little Help needed to walk in hospital room?: A Little Help needed climbing 3-5 steps with a railing? : A Lot 6 Click Score: 17    End of Session Equipment Utilized During Treatment: Gait belt Activity Tolerance: Patient tolerated treatment well Patient left: with call bell/phone within reach;in chair;with chair alarm set Nurse Communication: Mobility status PT Visit Diagnosis: Other abnormalities of gait and mobility (R26.89);Difficulty in walking, not elsewhere classified (R26.2);Muscle weakness (generalized) (M62.81)     Time: 3546-5681 PT Time Calculation (min) (ACUTE ONLY): 28 min  Charges:  $Gait Training: 8-22 mins $Therapeutic Exercise: 8-22 mins                     Jaquez Farrington R. PTA Acute Rehabilitation Services Office: [place] 11/30/2021, 4:21 PM

## 2021-11-30 NOTE — TOC Progression Note (Signed)
Transition of Care [facility]) - Progression Note    Patient Details  Name: Jared White MRN: [id number] Date of Birth: [birth year]  [facility] CM/SW Contact  Reece Agar, [place] Phone Number: 11/30/2021, 3:42 PM  Clinical Narrative:    Pt auth is approved, pt can DC when medically stable. CSW will continue to follow until DC.   Expected Discharge Plan: [place] Barriers to Discharge: Continued Medical Work up  Expected Discharge Plan and Services Expected Discharge Plan: [place] In-house Referral: Clinical Social Work   Post Acute Care Choice: [place] Living arrangements for the past 2 months: [place] Determinants of Health (SDOH) Interventions    Readmission Risk Interventions    08/26/2020    1:33 PM  Readmission Risk Prevention Plan  Transportation Screening Complete  PCP or Specialist Appt within 3-5 Days Complete  HRI or [place] Complete  Social Work Consult for [place] Planning/Counseling Complete  Palliative Care Screening Not Applicable  Medication Review Press photographer) Complete

## 2021-11-30 NOTE — Progress Notes (Addendum)
ANTICOAGULATION CONSULT NOTE   Pharmacy Consult for heparin  Indication: atrial fibrillation  No Known Allergies  Patient Measurements: Height: '5\' 8"'$  (172.7 cm) Weight: 55.9 kg (123 lb 3.8 oz) IBW/kg (Calculated) : 68.4   Vital Signs: Temp: 98.2 F (36.8 C) (07/11 0349) Temp Source: Oral (07/11 0349) BP: 113/60 (07/11 0349) Pulse Rate: 69 (07/11 0349)  Labs: Recent Labs    11/28/21 0536 11/29/21 0152 11/29/21 1017 11/30/21 0547  HGB  --  9.4*  --  9.6*  HCT  --  28.3*  --  28.4*  PLT  --  112*  --  135*  HEPARINUNFRC  --  0.62 0.77* >1.10*  CREATININE 1.15 1.58*  --  1.36*     Estimated Creatinine Clearance: 37.1 mL/min (A) (by C-G formula based on SCr of 1.36 mg/dL (H)).   Medical History: Past Medical History:  Diagnosis Date   Arthritis    Blood transfusion without reported diagnosis    Broken back    CAD (coronary artery disease)    PCI & CABG   Cataract    "LIGHT"-RIGHT   CHF (congestive heart failure) (HCC)    Diverticulitis    mild - approx 2004   Dysrhythmia    occasional arrythmias   Family history of heart disease    GERD (gastroesophageal reflux disease)    Heart murmur    HX OF YEARS AGO    History of kidney stones    History of nuclear stress test 06/30/2011   lexiscan; normal pattern of perfusion post-stress; low risk scan    Hyperlipidemia    Hypertension    IgA nephropathy    Irregular heart beat    Myocardial infarction ([place])    Pre-diabetes    Rectal cancer ([place]) 08/19/2019   Systemic hypertension     Medications:  Medications Prior to Admission  Medication Sig Dispense Refill Last Dose   AMLODIPINE BESYLATE PO Take 1 tablet by mouth 2 (two) times daily.   11/21/2021   Ascorbic Acid (VITAMIN C PO) Take 1 tablet by mouth daily.   11/21/2021   aspirin EC 81 MG tablet Take 1 tablet (81 mg total) by mouth daily. Swallow whole. (Patient taking differently: Take 162 mg by mouth daily.) 90 tablet 3 11/21/2021   atenolol (TENORMIN) 50 MG  tablet TAKE 1 TABLET BY MOUTH EVERY DAY (Patient taking differently: Take 50 mg by mouth daily.) 90 tablet 3 11/21/2021 at 08:00   b complex vitamins capsule Take 1 capsule by mouth daily.   11/21/2021   CALCIUM PO Take 1 tablet by mouth daily.   11/21/2021   Cholecalciferol (VITAMIN D-3 PO) Take 1 capsule by mouth daily.   11/21/2021   MAGNESIUM PO Take 1 tablet by mouth daily.   11/21/2021   nitroGLYCERIN (NITROSTAT) 0.4 MG SL tablet Place 0.4 mg under the tongue every 5 (five) minutes x 3 doses as needed for chest pain.   NEVER   omeprazole (PRILOSEC) 40 MG capsule TAKE 1 CAPSULE (40 MG TOTAL) BY MOUTH DAILY. 30 capsule 3 11/21/2021   rosuvastatin (CRESTOR) 20 MG tablet TAKE 1 TABLET BY MOUTH EVERY DAY (Patient taking differently: Take 20 mg by mouth daily.) 90 tablet 3 11/21/2021   valsartan (DIOVAN) 160 MG tablet TAKE 1 TABLET BY MOUTH EVERY DAY (Patient taking differently: Take 160 mg by mouth daily.) 90 tablet 3 11/21/2021   VITAMIN A PO Take 1 tablet by mouth daily.   11/21/2021   VITAMIN E PO Take 1 capsule by  mouth daily at 6 (six) AM.   11/21/2021   Scheduled:   calcium carbonate  400 mg of elemental calcium Oral Daily   Chlorhexidine Gluconate Cloth  6 each Topical Q0600   cholestyramine  4 g Oral BID   diltiazem  60 mg Oral Q8H   dronedarone  400 mg Oral BID WC   feeding supplement  237 mL Oral TID BM   multivitamin with minerals  1 tablet Oral Daily   pantoprazole  40 mg Oral Daily   QUEtiapine  50 mg Oral QHS   rosuvastatin  20 mg Oral Daily   sodium chloride flush  10-40 mL Intracatheter Q12H    Assessment: 76 yo male with afib/RVR and pharmacy consulted to dose heparin. He was on apixaban but this was discontinued due to bloody bowel movements which have resolved per chart notes.   Heparin level came back supratherapeutic at >1.1, on 650 units/hr. No s/sx of bleeding or infusion issues. Confirmed with nursing, draw this morning might be drawn from port where infusing but heparin stopped at  0600 for bronch today.   Goal of Therapy:  Heparin level 0.3-0.7 units/ml Monitor platelets by anticoagulation protocol: Yes   Plan:  -F/u after bronch for timing of heparin restart  Antonietta Jewel, PharmD, [place] Pharmacist  Phone: [phone] 11/30/2021 8:17 AM  Please check AMION for all [place] phone numbers After 10:00 PM, call [place] [phone]  ADDENDUM Patient now s/p bronch with biopsies. Confirmed with Dr [place] Harms okay to restart heparin without bolus at 1300. Will restart heparin infusion at 600 units/hr and get level 8 hours after restart.  Antonietta Jewel, PharmD, [place] Clinical Pharmacist  Phone: [phone] 11/30/2021 12:43 PM  Please check AMION for all [place] phone numbers After 10:00 PM, call [place] [phone]

## 2021-11-30 NOTE — Interval H&P Note (Signed)
History and Physical Interval Note:  11/30/2021 9:38 AM  Jared White  has presented today for surgery, with the diagnosis of lung nodule.  The various methods of treatment have been discussed with the patient and family. After consideration of risks, benefits and other options for treatment, the patient has consented to  Procedure(s) with comments: ROBOTIC ASSISTED NAVIGATIONAL BRONCHOSCOPY (Right) - ION w/ CIOS as a surgical intervention.  The patient's history has been reviewed, patient examined, no change in status, stable for surgery.  I have reviewed the patient's chart and labs.  Questions were answered to the patient's satisfaction.     [place]

## 2021-11-30 NOTE — Progress Notes (Signed)
PROGRESS NOTE    Franke Menter Beneke  WUJ:[id number] DOB: 10-09-1945 DOA: 11/21/2021 PCP: Caprice Red, MD   Brief Narrative:  Jared White is a 76 y.o. male with PMH significant for HTN, HLD, CAD/stent/CABG, CHF, GERD, IgA nephropathy, CKD, rectal cancer status post radiation, neoadjuvant treatment, diverting ileostomy s/p reversal with recent anastomotic recurrence, chronic diarrhea, hypocalcemia.    Patient presented to the ED on 7/2 with complaint of constant nausea for the past 2 days leading to poor oral intake, increased weakness, impaired gait, decreased urine output. He has chronic diarrhea due to colon cancer, has low appetite and was only able to tolerate liquid diet.  He is on chronic calcium and magnesium supplementation.  For the past 2 to 3 weeks, patient has also noted right paralumbar pain worse with movement and better at rest.    In the ED, patient was afebrile, hemodynamically stable. Labs showed low serum calcium and magnesium level. He was noted to have urinary retention of more than 470 ml.  Foley catheter was inserted. Admitted to hospital service for further evaluation management.   Had episode of A-fib RVR on morning of 7/4, had extreme restlessness and agitation with altered mental status.  He was started on Cardizem drip.  He converted to normal sinus rhythm and was switched to oral Cardizem.  Went back into A-fib RVR and resumed on Cardizem drip.  Cardiology was consulted, started on Multaq.    Has also been having episodes of bloody bowel movements in setting of Eliquis use.  Eliquis is currently on hold.  Hemoglobin remains stable.    Assessment & Plan:   Principal Problem:   Hypocalcemia Active Problems:   CAD (coronary artery disease)   Essential hypertension   S/P CABG x 4   Hypomagnesemia   Rectal cancer (HCC)   Acute renal failure superimposed on stage 3b chronic kidney disease (HCC)   Hydroureteronephrosis   Lumbar back pain   Elevated troponin    Protein-calorie malnutrition, severe   PAF (paroxysmal atrial fibrillation) (HCC)   Lung nodule  Acute on chronic hypocalcemia, hypomagnesemia -Has chronic low calcium and magnesium levels due to chronic diarrhea -Continue to monitor calcium and magnesium levels and replace accordingly.  Magnesium borderline low, will replace.  A-fib with RVR, new onset with paroxysmal A-fib -Initially converted to normal sinus rhythm on Cardizem drip and subsequently switched to oral Cardizem.  Converted back into A-fib RVR and resumed back on Cardizem drip.  And subsequently transition to oral Cardizem on 11/28/2021.  Cardiology Dr. Bonney Roussel team on board. -CHA2DS2-VASc score 5, recommend anticoagulation.  Started on Eliquis, but this was discontinued in setting of worsening bloody bowel movements.  Patient reportedly has not had any further rectal bleeding and his hemoglobin has remained stable for last several days so he was restarted on heparin on 11/28/2021.  Cardiology recommends continuing Multaq.  Will likely switch back to Eliquis tomorrow morning.  Delirium and sundowning in setting of undiagnosed dementia Patient fully alert and oriented.  Brother-in-law at the bedside reports no further episodes of sundowning.  Continue current nightly Seroquel and as needed Haldol. 1. Avoid benzodiazepines, antihistamines, anticholinergics, and minimize opiate use as these may worsen delirium. 2: Assess, prevent and manage pain as lack of treatment can result in delirium.  3: Provide appropriate lighting and clear signage; a clock and calendar should be easily visible to the patient. 4: Monitor environmental factors. Reduce light and noise at night (close shades, turn off lights, turn off TV, ect).  Correct any alterations in sleep cycle. 5: Reorient the patient to person, place, time and situation on each encounter.  6: Correct sensory deficits if possible (replace eye glasses, hearing aids, ect). 7: Avoid  restraints if able. Severely delirious patients benefit from constant observation by a sitter.   History of rectal cancer -3/21 rectal cancer diagnosed after seen colonoscopy.  Completed total neoadjuvant treatment with Dr. Tami Lin.  Finished radiation in 11/21.  Underwent APR on January 2022 with a diverting ileostomy.  May 2022, underwent ileostomy reversal.  Follow-up endoscopy recently showed an anastomotic polyp biopsy of which showed adenocarcinoma. -General surgery planned for permanent colostomy but the surgery had to be canceled twice.  First time was because of poor bowel prep and second time on 11/10/2021 was because of hypocalcemia.   -Per Dr. Marcello Moores, could consider a diverting ostomy in the hospital, if pt proceeding to a palliative situation -Currently abdominoperineal resection scheduled for 8/11 -Bloody bowel movement.  Repeat hemoglobin is stable    Pulmonary nodule -CT angio of chest showed slowly enlarging solid right upper lobe pulmonary nodule since last check, now 6 to 7 mm, this is more suspicious for bronchogenic carcinoma then solitary lung metastasis.  Underwent bronchoscopy by Dr. Valeta Harms today.    Hypertension -Controlled, Cardizem   Chronic diarrhea/constipation -Questran   Demand ischemia -No symptoms of ACS -Aspirin on hold due to bloody bowel movement   HLD -Crestor    AKI on CKD stage II, history of IgA nephropathy -Baseline creatinine 0.9-1 -AKI resolved and chilly but creatinine now rising again.  Continue IV fluids.   Chronic L2 compression fracture -Reported several weeks of right paralumbar spinal pain worse with movement and better at rest -Lumbar spine x-ray showed chronic compression fracture deformity at the level of L2. Moderate to marked severity multilevel degenerative disc disease   Bilateral hydroureteronephrosis -CT abdomen noted bilateral hydroureteronephrosis secondary to urinary retention likely at the level of bladder neck.   -Foley  catheter was placed, plan for voiding trial prior to discharge   GERD -PPI  Anemia of chronic disease: Hemoglobin stable.  Chronic thrombocytopenia: Platelets stable.  No bleeding.   DVT prophylaxis: Place and maintain sequential compression device Start: 11/25/21 1228, heparin   Code Status: Full Code  Family Communication: None at bedside. Status is: Inpatient Remains inpatient appropriate because: Had bronchoscopy today, potential discharge tomorrow.   Estimated body mass index is 18.74 kg/m as calculated from the following:   Height as of this encounter: '5\' 8"'$  (1.727 m).   Weight as of this encounter: 55.9 kg.    Nutritional Assessment: Body mass index is 18.74 kg/m.Marland Kitchen Seen by dietician.  I agree with the assessment and plan as outlined below: Nutrition Status: Nutrition Problem: Severe Malnutrition Etiology: chronic illness (colon cancer) Signs/Symptoms: moderate fat depletion, severe fat depletion, severe muscle depletion, moderate muscle depletion Interventions: Boost Breeze, Magic cup, MVI  . Skin Assessment: I have examined the patient's skin and I agree with the wound assessment as performed by the wound care RN as outlined below:    Consultants:  Cardiology  Procedures:  None  Antimicrobials:  Anti-infectives (From admission, onward)    None         Subjective: Seen and examined.  No complaints.  Alert and oriented.  Objective: Vitals:   11/30/21 1050 11/30/21 1105 11/30/21 1120 11/30/21 1204  BP: (!) 117/57 (!) 116/56 (!) 117/54 134/60  Pulse: 73 77 75 81  Resp: '18 18 17 19  '$ Temp: 98.6 F (  37 C)  98.7 F (37.1 C) (!) 97.5 F (36.4 C)  TempSrc:    Oral  SpO2: 97% 97% 96% 97%  Weight:      Height:        Intake/Output Summary (Last 24 hours) at 11/30/2021 1441 Last data filed at 11/30/2021 1050 Gross per 24 hour  Intake 940 ml  Output 2105 ml  Net -1165 ml    Filed Weights   11/21/21 1616 11/22/21 0553 11/30/21 0900  Weight:  58.1 kg 55.9 kg 55.9 kg    Examination:  General exam: Appears calm and comfortable  Respiratory system: Clear to auscultation. Respiratory effort normal. Cardiovascular system: S1 & S2 heard, RRR. No JVD, murmurs, rubs, gallops or clicks. No pedal edema. Gastrointestinal system: Abdomen is nondistended, soft and nontender. No organomegaly or masses felt. Normal bowel sounds heard. Central nervous system: Alert and oriented. No focal neurological deficits. Extremities: Symmetric 5 x 5 power. Skin: No rashes, lesions or ulcers.  Psychiatry: Judgement and insight appear normal. Mood & affect appropriate.   Data Reviewed: I have personally reviewed following labs and imaging studies  CBC: Recent Labs  Lab 11/24/21 0604 11/25/21 0850 11/26/21 0424 11/27/21 0354 11/29/21 0152 11/30/21 0547  WBC 6.8 8.3 10.2 8.0 5.5 5.6  NEUTROABS 5.5  --   --   --   --   --   HGB 9.9* 11.7* 11.2* 9.2* 9.4* 9.6*  HCT 29.1* 34.7* 33.8* 28.5* 28.3* 28.4*  MCV 94.8 95.3 96.8 97.6 96.6 95.6  PLT 88* 132* 139* 102* 112* 135*    Basic Metabolic Panel: Recent Labs  Lab 11/26/21 0424 11/27/21 0354 11/28/21 0536 11/29/21 0152 11/30/21 0547  NA 137 138 137 136 137  K 4.5 4.5 3.8 4.1 4.1  CL 107 112* 108 108 105  CO2 20* 21* 22 21* 24  GLUCOSE 155* 97 100* 116* 92  BUN 26* '21 16 21 22  '$ CREATININE 1.25* 1.12 1.15 1.58* 1.36*  CALCIUM 9.1 8.2* 8.5* 8.8* 9.0  MG 1.7 2.0 1.6* 1.6* 1.7    GFR: Estimated Creatinine Clearance: 37.1 mL/min (A) (by C-G formula based on SCr of 1.36 mg/dL (H)). Liver Function Tests: Recent Labs  Lab 11/24/21 0604 11/25/21 0423  AST 27 25  ALT 19 20  ALKPHOS 59 62  BILITOT 0.7 0.8  PROT 5.1* 6.0*  ALBUMIN 2.7* 3.1*    No results for input(s): "LIPASE", "AMYLASE" in the last 168 hours. No results for input(s): "AMMONIA" in the last 168 hours. Coagulation Profile: No results for input(s): "INR", "PROTIME" in the last 168 hours. Cardiac Enzymes: No results for  input(s): "CKTOTAL", "CKMB", "CKMBINDEX", "TROPONINI" in the last 168 hours. BNP (last 3 results) No results for input(s): "PROBNP" in the last 8760 hours. HbA1C: No results for input(s): "HGBA1C" in the last 72 hours. CBG: No results for input(s): "GLUCAP" in the last 168 hours. Lipid Profile: No results for input(s): "CHOL", "HDL", "LDLCALC", "TRIG", "CHOLHDL", "LDLDIRECT" in the last 72 hours. Thyroid Function Tests: No results for input(s): "TSH", "T4TOTAL", "FREET4", "T3FREE", "THYROIDAB" in the last 72 hours. Anemia Panel: No results for input(s): "VITAMINB12", "FOLATE", "FERRITIN", "TIBC", "IRON", "RETICCTPCT" in the last 72 hours. Sepsis Labs: No results for input(s): "PROCALCITON", "LATICACIDVEN" in the last 168 hours.  Recent Results (from the past 240 hour(s))  SARS Coronavirus 2 by RT PCR (hospital order, performed in [facility] hospital lab) *cepheid single result test* Anterior Nasal Swab     Status: None   Collection Time: 11/30/21  8:17 AM   Specimen: Anterior Nasal Swab  Result Value Ref Range Status   SARS Coronavirus 2 by RT PCR NEGATIVE NEGATIVE Final    Comment: (NOTE) SARS-CoV-2 target nucleic acids are NOT DETECTED.  The SARS-CoV-2 RNA is generally detectable in upper and lower respiratory specimens during the acute phase of infection. The lowest concentration of SARS-CoV-2 viral copies this assay can detect is 250 copies / mL. A negative result does not preclude SARS-CoV-2 infection and should not be used as the sole basis for treatment or other patient management decisions.  A negative result may occur with improper specimen collection / handling, submission of specimen other than nasopharyngeal swab, presence of viral mutation(s) within the areas targeted by this assay, and inadequate number of viral copies (<250 copies / mL). A negative result must be combined with clinical observations, patient history, and epidemiological information.  Fact Sheet for  Patients:   https://www.patel.info/  Fact Sheet for Healthcare Providers: https://hall.com/  This test is not yet approved or  cleared by the [place] FDA and has been authorized for detection and/or diagnosis of SARS-CoV-2 by FDA under an Emergency Use Authorization (EUA).  This EUA will remain in effect (meaning this test can be used) for the duration of the COVID-19 declaration under Section 564(b)(1) of the Act, 21 U.S.C. section 360bbb-3(b)(1), unless the authorization is terminated or revoked sooner.  Performed at [facility], [place] [address], [place], [place] [address]      Radiology Studies: DG Chest Port 1 View  Result Date: 11/30/2021 CLINICAL DATA:  Postop bronch. EXAM: PORTABLE CHEST 1 VIEW COMPARISON:  Chest x-ray 11/21/2021. FINDINGS: No consolidation. Known right upper lobe pulmonary nodule better characterized on recent CT chest. No visible pneumothorax. Cardiomediastinal silhouette is similar prior. Median sternotomy and CABG. Similar position of right IJ Port-A-Cath with tip at the superior cavoatrial junction. IMPRESSION: No visible pneumothorax. Electronically Signed   By: Margaretha Sheffield M.D.   On: 11/30/2021 11:41   DG C-ARM BRONCHOSCOPY  Result Date: 11/30/2021 C-ARM BRONCHOSCOPY: Fluoroscopy was utilized by the requesting physician.  No radiographic interpretation.    Scheduled Meds:  calcium carbonate  400 mg of elemental calcium Oral Daily   Chlorhexidine Gluconate Cloth  6 each Topical Q0600   cholestyramine  4 g Oral BID   diltiazem  60 mg Oral Q8H   dronedarone  400 mg Oral BID WC   feeding supplement  237 mL Oral TID BM   multivitamin with minerals  1 tablet Oral Daily   pantoprazole  40 mg Oral Daily   QUEtiapine  50 mg Oral QHS   rosuvastatin  20 mg Oral Daily   sodium chloride flush  10-40 mL Intracatheter Q12H   Continuous Infusions:  sodium chloride 100 mL/hr at 11/28/21 4008   heparin  600 Units/hr (11/30/21 1310)     LOS: 8 days   Darliss Cheney, MD [facility]  11/30/2021, 2:41 PM   *Please note that this is a verbal dictation therefore any spelling or grammatical errors are due to the "[place] [facility]" system interpretation.  Please page via [place] and do not message via secure chat for urgent patient care matters. Secure chat can be used for non urgent patient care matters.  How to contact the [facility] Attending or Consulting provider [place] or covering provider during after hours [place], for this patient?  Check the care team in [facility] and look for a) attending/consulting [place] provider listed and b) the  [place] team listed. Page or secure chat [phone]. Log into [email or web address] and use [place]'s universal password to access. If you do not have the password, please contact the hospital operator. Locate the [facility] provider you are looking for under [facility] and page to a number that you can be directly reached. If you still have difficulty reaching the provider, please page the [facility] (Director on Call) for the Hospitalists listed on amion for assistance.

## 2021-11-30 NOTE — Progress Notes (Signed)
ANTICOAGULATION CONSULT NOTE   Pharmacy Consult for heparin  Indication: atrial fibrillation  No Known Allergies  Patient Measurements: Height: '5\' 8"'$  (172.7 cm) Weight: 55.9 kg (123 lb 3.8 oz) IBW/kg (Calculated) : 68.4   Vital Signs: Temp: 98.6 F (37 C) (07/11 2020) Temp Source: Oral (07/11 2020) BP: 134/71 (07/11 2020) Pulse Rate: 90 (07/11 2020)  Labs: Recent Labs    11/28/21 0536 11/29/21 0152 11/29/21 0152 11/29/21 1017 11/30/21 0547 11/30/21 2218  HGB  --  9.4*  --   --  9.6*  --   HCT  --  28.3*  --   --  28.4*  --   PLT  --  112*  --   --  135*  --   HEPARINUNFRC  --  0.62   < > 0.77* >1.10* >1.10*  CREATININE 1.15 1.58*  --   --  1.36*  --    < > = values in this interval not displayed.     Estimated Creatinine Clearance: 37.1 mL/min (A) (by C-G formula based on SCr of 1.36 mg/dL (H)).   Medical History: Past Medical History:  Diagnosis Date   Arthritis    Blood transfusion without reported diagnosis    Broken back    CAD (coronary artery disease)    PCI & CABG   Cataract    "LIGHT"-RIGHT   CHF (congestive heart failure) (HCC)    Diverticulitis    mild - approx 2004   Dysrhythmia    occasional arrythmias   Family history of heart disease    GERD (gastroesophageal reflux disease)    Heart murmur    HX OF YEARS AGO    History of kidney stones    History of nuclear stress test 06/30/2011   lexiscan; normal pattern of perfusion post-stress; low risk scan    Hyperlipidemia    Hypertension    IgA nephropathy    Irregular heart beat    Myocardial infarction ([place])    Pre-diabetes    Rectal cancer ([place]) 08/19/2019   Systemic hypertension     Medications:  Medications Prior to Admission  Medication Sig Dispense Refill Last Dose   AMLODIPINE BESYLATE PO Take 1 tablet by mouth 2 (two) times daily.   11/21/2021   Ascorbic Acid (VITAMIN C PO) Take 1 tablet by mouth daily.   11/21/2021   aspirin EC 81 MG tablet Take 1 tablet (81 mg total) by mouth  daily. Swallow whole. (Patient taking differently: Take 162 mg by mouth daily.) 90 tablet 3 11/21/2021   atenolol (TENORMIN) 50 MG tablet TAKE 1 TABLET BY MOUTH EVERY DAY (Patient taking differently: Take 50 mg by mouth daily.) 90 tablet 3 11/21/2021 at 08:00   b complex vitamins capsule Take 1 capsule by mouth daily.   11/21/2021   CALCIUM PO Take 1 tablet by mouth daily.   11/21/2021   Cholecalciferol (VITAMIN D-3 PO) Take 1 capsule by mouth daily.   11/21/2021   MAGNESIUM PO Take 1 tablet by mouth daily.   11/21/2021   nitroGLYCERIN (NITROSTAT) 0.4 MG SL tablet Place 0.4 mg under the tongue every 5 (five) minutes x 3 doses as needed for chest pain.   NEVER   omeprazole (PRILOSEC) 40 MG capsule TAKE 1 CAPSULE (40 MG TOTAL) BY MOUTH DAILY. 30 capsule 3 11/21/2021   rosuvastatin (CRESTOR) 20 MG tablet TAKE 1 TABLET BY MOUTH EVERY DAY (Patient taking differently: Take 20 mg by mouth daily.) 90 tablet 3 11/21/2021   valsartan (DIOVAN) 160 MG  tablet TAKE 1 TABLET BY MOUTH EVERY DAY (Patient taking differently: Take 160 mg by mouth daily.) 90 tablet 3 11/21/2021   VITAMIN A PO Take 1 tablet by mouth daily.   11/21/2021   VITAMIN E PO Take 1 capsule by mouth daily at 6 (six) AM.   11/21/2021   Scheduled:   calcium carbonate  400 mg of elemental calcium Oral Daily   Chlorhexidine Gluconate Cloth  6 each Topical Q0600   cholestyramine  4 g Oral BID   diltiazem  60 mg Oral Q8H   dronedarone  400 mg Oral BID WC   feeding supplement  237 mL Oral TID BM   multivitamin with minerals  1 tablet Oral Daily   pantoprazole  40 mg Oral Daily   QUEtiapine  50 mg Oral QHS   rosuvastatin  20 mg Oral Daily   sodium chloride flush  10-40 mL Intracatheter Q12H    Assessment: 76 yo male with afib/RVR and pharmacy consulted to dose heparin. He was on apixaban but this was discontinued due to bloody bowel movements which have resolved per chart notes.   Heparin level came back supratherapeutic at >1.10, on 600 units/hr. Lab was  collected from port, but RN states she paused the infusion and flushed. No s/sx of bleeding or infusion issues. Heparin paused earlier for scheduled bronch then resumed ~1300.  Goal of Therapy:  Heparin level 0.3-0.7 units/ml Monitor platelets by anticoagulation protocol: Yes   Plan:  -Hold heparin x1 hour -Reduce heparin infusion to 500 units/hr -Heparin level in 8 hours and daily wth CBC daily   Georga Bora, PharmD Clinical Pharmacist 11/30/2021 11:22 PM Please check AMION for all [place] numbers

## 2021-11-30 NOTE — Plan of Care (Signed)
  Problem: Clinical Measurements: Goal: Respiratory complications will improve Outcome: Progressing Goal: Cardiovascular complication will be avoided Outcome: Progressing   Problem: Activity: Goal: Risk for activity intolerance will decrease Outcome: Progressing   Problem: Coping: Goal: Level of anxiety will decrease Outcome: Progressing   Problem: Elimination: Goal: Will not experience complications related to urinary retention Outcome: Progressing   Problem: Elimination: Goal: Will not experience complications related to bowel motility Outcome: Not Progressing

## 2021-12-01 ENCOUNTER — Encounter (HOSPITAL_COMMUNITY): Payer: Self-pay | Admitting: Pulmonary Disease

## 2021-12-01 LAB — CBC
HCT: 27.9 % — ABNORMAL LOW (ref 39.0–52.0)
Hemoglobin: 9.6 g/dL — ABNORMAL LOW (ref 13.0–17.0)
MCH: 32.7 pg (ref 26.0–34.0)
MCHC: 34.4 g/dL (ref 30.0–36.0)
MCV: 94.9 fL (ref 80.0–100.0)
Platelets: 137 10*3/uL — ABNORMAL LOW (ref 150–400)
RBC: 2.94 MIL/uL — ABNORMAL LOW (ref 4.22–5.81)
RDW: 13.2 % (ref 11.5–15.5)
WBC: 8.2 10*3/uL (ref 4.0–10.5)
nRBC: 0.2 % (ref 0.0–0.2)

## 2021-12-01 LAB — BASIC METABOLIC PANEL
Anion gap: 8 (ref 5–15)
Anion gap: 9 (ref 5–15)
BUN: 29 mg/dL — ABNORMAL HIGH (ref 8–23)
BUN: 30 mg/dL — ABNORMAL HIGH (ref 8–23)
CO2: 21 mmol/L — ABNORMAL LOW (ref 22–32)
CO2: 20 mmol/L — ABNORMAL LOW (ref 22–32)
Calcium: 8.5 mg/dL — ABNORMAL LOW (ref 8.9–10.3)
Calcium: 8.5 mg/dL — ABNORMAL LOW (ref 8.9–10.3)
Chloride: 108 mmol/L (ref 98–111)
Chloride: 107 mmol/L (ref 98–111)
Creatinine, Ser: 1.64 mg/dL — ABNORMAL HIGH (ref 0.61–1.24)
Creatinine, Ser: 1.54 mg/dL — ABNORMAL HIGH (ref 0.61–1.24)
GFR, Estimated: 43 mL/min — ABNORMAL LOW (ref >60)
GFR, Estimated: 47 mL/min — ABNORMAL LOW (ref >60)
Glucose, Bld: 144 mg/dL — ABNORMAL HIGH (ref 70–99)
Glucose, Bld: 131 mg/dL — ABNORMAL HIGH (ref 70–99)
Potassium: 4.7 mmol/L (ref 3.5–5.1)
Potassium: 4.6 mmol/L (ref 3.5–5.1)
Sodium: 137 mmol/L (ref 135–145)
Sodium: 136 mmol/L (ref 135–145)

## 2021-12-01 LAB — HEPARIN LEVEL (UNFRACTIONATED): Heparin Unfractionated: >1.1 IU/mL — ABNORMAL HIGH (ref 0.30–0.70)

## 2021-12-01 LAB — MAGNESIUM: Magnesium: 1.9 mg/dL (ref 1.7–2.4)

## 2021-12-01 LAB — APTT: aPTT: >200 seconds (ref 24–36)

## 2021-12-01 MED ORDER — TAMSULOSIN HCL 0.4 MG PO CAPS: 0.4000 mg | ORAL_CAPSULE | Freq: Every day | ORAL | 0 refills | Status: DC

## 2021-12-01 MED ORDER — LOPERAMIDE HCL 2 MG PO CAPS
2.0000 mg | ORAL_CAPSULE | ORAL | Status: DC | PRN
  Administered 2021-12-01: 2 mg via ORAL
  Filled 2021-12-01: qty 1

## 2021-12-01 MED ORDER — DILTIAZEM HCL ER 90 MG PO CP12: 90.0000 mg | ORAL_CAPSULE | Freq: Two times a day (BID) | ORAL | 0 refills | Status: DC

## 2021-12-01 MED ORDER — SODIUM CHLORIDE 0.9 % IV BOLUS
1000.0000 mL | Freq: Once | INTRAVENOUS | Status: AC
Start: 2021-12-01 — End: 2021-12-01
  Administered 2021-12-01: 1000 mL via INTRAVENOUS

## 2021-12-01 MED ORDER — APIXABAN 5 MG PO TABS
5.0000 mg | ORAL_TABLET | Freq: Two times a day (BID) | ORAL | Status: DC
  Administered 2021-12-01: 5 mg via ORAL
  Filled 2021-12-01: qty 1

## 2021-12-01 MED ORDER — APIXABAN 5 MG PO TABS: 5.0000 mg | ORAL_TABLET | Freq: Two times a day (BID) | ORAL | 0 refills | Status: DC

## 2021-12-01 MED ORDER — HEPARIN SOD (PORK) LOCK FLUSH 100 UNIT/ML IV SOLN
500.0000 [IU] | INTRAVENOUS | Status: AC | PRN
  Administered 2021-12-01: 500 [IU]

## 2021-12-01 MED ORDER — DRONEDARONE HCL 400 MG PO TABS: 400.0000 mg | ORAL_TABLET | Freq: Two times a day (BID) | ORAL | 0 refills | Status: DC

## 2021-12-01 NOTE — Progress Notes (Signed)
I intend to continue Multaq until his diverting ostomy surgery, to prevent Afib hindering surgical plans. We can switch diltiazem 60 mg tid to SR 90 mg bid on discharge. Will arrange f/u in 2 weeks.   Nigel Mormon, MD Pager: [phone] Office: [phone]

## 2021-12-01 NOTE — [place] FL2 (Signed)
[place] MEDICAID FL2 LEVEL OF CARE SCREENING TOOL     IDENTIFICATION  Patient Name: Jared White Birthdate: 1946/01/25 Sex: male Admission Date (Current Location): 11/21/2021  [facility] and [place] Number:  Herbalist and Address:  [facility], [place] [address] [place], [place] [address]      Provider Number: [id number]  Attending Physician Name and Address:  Darliss Cheney, MD  Relative Name and Phone Number:  Jill Poling (Relative)   [phone]    Current Level of Care: Hospital Recommended Level of Care: [place] Prior Approval Number:    Date Approved/Denied:   PASRR Number: [id number]  Discharge Plan: SNF    Current Diagnoses: Patient Active Problem List   Diagnosis Date Noted   Lung nodule 11/30/2021   PAF (paroxysmal atrial fibrillation) ([place])    Protein-calorie malnutrition, severe 11/23/2021   Acute renal failure superimposed on stage 3b chronic kidney disease ([place]) 11/21/2021   Hydroureteronephrosis 11/21/2021   Lumbar back pain 11/21/2021   Elevated troponin 11/21/2021   Pre-op exam 08/11/2021   Ileostomy in place [facility]) 10/09/2020   AKI (acute kidney injury) ([place]) 08/24/2020   Preop cardiovascular exam 03/19/2020   Gastric polyps 02/07/2020   History of rectal cancer 02/07/2020   History of colonic polyps 02/07/2020   Barrett's esophagus without dysplasia 02/07/2020   Port-A-Cath in place 11/27/2019   Weight loss 11/27/2019   Rectal cancer ([place]) 08/19/2019   Abnormal MRI, pelvis 07/26/2019   Abnormal CT scan, pelvis 07/26/2019   Rectal bleeding 07/26/2019   Hemorrhoids 07/26/2019   Constipation 07/26/2019   Stage 3b chronic kidney disease ([place]) 09/13/2018   Coronary artery disease involving native coronary artery of native heart without angina pectoris 09/13/2018   Microalbuminuria 10/16/2015   Facial numbness    Hypomagnesemia    Hypocalcemia 10/08/2015   Psoriasis 11/21/2013   S/P CABG x 4  03/14/2013   Multiple thoracic/lumbar transverse process fractures 12/15/2012   Fall from roof 12/15/2012   CAD (coronary artery disease) 12/15/2012   GERD (gastroesophageal reflux disease) 12/15/2012   Essential hypertension 12/15/2012   Hyperlipidemia 12/15/2012   Multiple bilateral rib fractures     Orientation RESPIRATION BLADDER Height & Weight     Time, Self, Situation, Place  Normal Incontinent, External catheter Weight: 123 lb 3.8 oz (55.9 kg) Height:  '5\' 8"'$  (172.7 cm)  BEHAVIORAL SYMPTOMS/MOOD NEUROLOGICAL BOWEL NUTRITION STATUS      Continent Diet (See DC  Summary)  AMBULATORY STATUS COMMUNICATION OF NEEDS Skin   Limited Assist Verbally Normal                       Personal Care Assistance Level of Assistance  Bathing, Dressing, Feeding Bathing Assistance: Limited assistance Feeding assistance: Independent Dressing Assistance: Limited assistance     Functional Limitations Info  Sight, Hearing, Speech Sight Info: Impaired Hearing Info: Adequate Speech Info: Adequate    SPECIAL CARE FACTORS FREQUENCY  PT (By licensed PT), OT (By licensed OT)     PT Frequency: 5x a week OT Frequency: 5x a week            Contractures Contractures Info: Not present    Additional Factors Info  Code Status, Allergies Code Status Info: Full Allergies Info: NKA           Current Medications (12/01/2021):  This is the current hospital active medication list Current Facility-Administered Medications  Medication Dose Route Frequency Provider Last Rate Last Admin  0.9 %  sodium chloride infusion   Intravenous Continuous Dessa Phi, DO   Stopped at 11/28/21 1708   apixaban (ELIQUIS) tablet 5 mg  5 mg Oral BID Lyndee Leo, RPH   5 mg at 12/01/21 7824   calcium carbonate (TUMS - dosed in mg elemental calcium) chewable tablet 400 mg of elemental calcium  400 mg of elemental calcium Oral Daily Tu, Ching T, DO   400 mg of elemental calcium at 12/01/21 0959    Chlorhexidine Gluconate Cloth 2 % PADS 6 each  6 each Topical Q0600 Terrilee Croak, MD   6 each at 12/01/21 0647   cholestyramine (QUESTRAN) packet 4 g  4 g Oral BID Terrilee Croak, MD   4 g at 12/01/21 0959   diltiazem (CARDIZEM) tablet 60 mg  60 mg Oral Q8H Tolia, Sunit, DO   60 mg at 12/01/21 1312   dronedarone (MULTAQ) tablet 400 mg  400 mg Oral BID WC Patwardhan, Manish J, MD   400 mg at 12/01/21 0959   feeding supplement (ENSURE ENLIVE / ENSURE PLUS) liquid 237 mL  237 mL Oral TID BM Pahwani, Einar Grad, MD   237 mL at 11/30/21 2029   haloperidol lactate (HALDOL) injection 5 mg  5 mg Intravenous Q6H PRN Dessa Phi, DO   5 mg at 11/28/21 2353   Or   haloperidol lactate (HALDOL) injection 5 mg  5 mg Intramuscular Q6H PRN Dessa Phi, DO       loperamide (IMODIUM) capsule 2 mg  2 mg Oral PRN Darliss Cheney, MD   2 mg at 12/01/21 1312   multivitamin with minerals tablet 1 tablet  1 tablet Oral Daily Dahal, Marlowe Aschoff, MD   1 tablet at 12/01/21 0959   ondansetron (ZOFRAN) injection 4 mg  4 mg Intravenous Q6H PRN Tu, Ching T, DO   4 mg at 11/22/21 6144   Oral care mouth rinse  15 mL Mouth Rinse PRN Dahal, Marlowe Aschoff, MD       pantoprazole (PROTONIX) EC tablet 40 mg  40 mg Oral Daily Dahal, Marlowe Aschoff, MD   40 mg at 12/01/21 0959   QUEtiapine (SEROQUEL) tablet 50 mg  50 mg Oral QHS Dessa Phi, DO   50 mg at 11/30/21 2204   rosuvastatin (CRESTOR) tablet 20 mg  20 mg Oral Daily Dahal, Binaya, MD   20 mg at 12/01/21 1005   sodium chloride flush (NS) 0.9 % injection 10-40 mL  10-40 mL Intracatheter Q12H Dahal, Marlowe Aschoff, MD   10 mL at 12/01/21 1005   sodium chloride flush (NS) 0.9 % injection 10-40 mL  10-40 mL Intracatheter PRN Terrilee Croak, MD         Discharge Medications: Please see discharge summary for a list of discharge medications.  Relevant Imaging Results:  Relevant Lab Results:   Additional Information SSN: [id number]; [place] COVID-19 Vaccine 01/17/2020 , 07/22/2019 , 06/24/2019  Reece Agar, LCSWA

## 2021-12-01 NOTE — Progress Notes (Addendum)
[place] for heparin>apixaban  Indication: atrial fibrillation  No Known Allergies  Patient Measurements: Height: '5\' 8"'$  (172.7 cm) Weight: 55.9 kg (123 lb 3.8 oz) IBW/kg (Calculated) : 68.4   Vital Signs: Temp: 97.6 F (36.4 C) (07/12 0820) Temp Source: Oral (07/12 0820) BP: 114/53 (07/12 0820) Pulse Rate: 83 (07/12 0820)  Labs: Recent Labs    11/29/21 0152 11/29/21 1017 11/30/21 0547 11/30/21 2218 12/01/21 0455  HGB 9.4*  --  9.6*  --  9.6*  HCT 28.3*  --  28.4*  --  27.9*  PLT 112*  --  135*  --  137*  APTT  --   --   --   --  >200*  HEPARINUNFRC 0.62   < > >1.10* >1.10* >1.10*  CREATININE 1.58*  --  1.36*  --   --    < > = values in this interval not displayed.     Estimated Creatinine Clearance: 37.1 mL/min (A) (by C-G formula based on SCr of 1.36 mg/dL (H)).   Medical History: Past Medical History:  Diagnosis Date   Arthritis    Blood transfusion without reported diagnosis    Broken back    CAD (coronary artery disease)    PCI & CABG   Cataract    "LIGHT"-RIGHT   CHF (congestive heart failure) (HCC)    Diverticulitis    mild - approx 2004   Dysrhythmia    occasional arrythmias   Family history of heart disease    GERD (gastroesophageal reflux disease)    Heart murmur    HX OF YEARS AGO    History of kidney stones    History of nuclear stress test 06/30/2011   lexiscan; normal pattern of perfusion post-stress; low risk scan    Hyperlipidemia    Hypertension    IgA nephropathy    Irregular heart beat    Myocardial infarction (HCC)    Pre-diabetes    Rectal cancer ([place]) 08/19/2019   Systemic hypertension     Medications:   Scheduled:   apixaban  5 mg Oral BID   calcium carbonate  400 mg of elemental calcium Oral Daily   Chlorhexidine Gluconate Cloth  6 each Topical Q0600   cholestyramine  4 g Oral BID   diltiazem  60 mg Oral Q8H   dronedarone  400 mg Oral BID WC   feeding supplement  237 mL Oral  TID BM   multivitamin with minerals  1 tablet Oral Daily   pantoprazole  40 mg Oral Daily   QUEtiapine  50 mg Oral QHS   rosuvastatin  20 mg Oral Daily   sodium chloride flush  10-40 mL Intracatheter Q12H    Assessment: 76 yo male with afib/RVR and pharmacy consulted to dose heparin. He was on apixaban but this was discontinued due to bloody bowel movements which have resolved per chart notes.   Overnight heparin level came back supratherapeutic at >1.10, on 600 units/hr. Lab was collected from port, but RN states she paused the infusion and flushed. No s/sx of bleeding or infusion issues.   New orders to transition back to [place]. Hemoglobin stable in 9s, plt ok at 137. Marland Kitchen Patient currently borderline for dose reduction, scr up slightly today. Would suggest follow up bmet as outpatient and adjust dose if creatinine still elevated over 1.5.   Goal of Therapy:  Heparin level 0.3-0.7 units/ml Monitor platelets by anticoagulation protocol: Yes   Plan:  Apixaban '5mg'$  bid Will need  dose adjustment if scr trends >1.5 as outpatient  Erin Hearing PharmD., BCPS Clinical Pharmacist 12/01/2021 9:22 AM

## 2021-12-01 NOTE — Discharge Summary (Addendum)
PatientPhysician Discharge Summary  Jared White IDP:[id number] DOB: 07-26-1945 DOA: 11/21/2021  PCP: Caprice Red, MD  Admit date: 11/21/2021 Discharge date: 12/01/2021 30 Day Unplanned Readmission Risk Score    Flowsheet Row ED to Hosp-Admission (Current) from 11/21/2021 in [facility]  30 Day Unplanned Readmission Risk Score (%) 22.64 Filed at 12/01/2021 0801       This score is the patient's risk of an unplanned readmission within 30 days of being discharged (0 -100%). The score is based on dignosis, age, lab data, medications, orders, and past utilization.   Low:  0-14.9   Medium: 15-21.9   High: 22-29.9   Extreme: 30 and above          Admitted From: Home Disposition: SNF  Recommendations for Outpatient Follow-up:  Follow up with PCP in 1-2 weeks Please obtain BMP/CBC in one week Follow-up with radiation therapist next Tuesday. Follow-up with cardiology in 2 weeks Follow-up with general surgery as scheduled Please follow up with your PCP on the following pending results: Unresulted Labs (From admission, onward)     Start     Ordered   12/02/21 0500  CBC  Tomorrow morning,   R       Question:  Specimen collection method  Answer:  Unit=Unit collect   12/01/21 0915   12/02/21 4431  Basic metabolic panel  Tomorrow morning,   R       Question:  Specimen collection method  Answer:  Unit=Unit collect   12/01/21 0915   12/02/21 0500  Magnesium  Tomorrow morning,   R       Question:  Specimen collection method  Answer:  Unit=Unit collect   12/01/21 0915   12/01/21 5400  Basic metabolic panel  ONCE - URGENT,   URGENT       Question:  Specimen collection method  Answer:  Unit=Unit collect   12/01/21 0835              Home Health: None Equipment/Devices: None  Discharge Condition: Stable CODE STATUS: Full code Diet recommendation: Cardiac  Subjective: Seen and examined.  Feels well.  No complaints.  Brother-in-law at the bedside.  Patient and  brother-in-law are both agreeable with the discharge plan today.  Brief/Interim Summary: Jared White is a 76 y.o. male with PMH significant for HTN, HLD, CAD/stent/CABG, CHF, GERD, IgA nephropathy, CKD, rectal cancer status post radiation, neoadjuvant treatment, diverting ileostomy s/p reversal with recent anastomotic recurrence, chronic diarrhea, hypocalcemia.    Patient presented to the ED on 7/2 with complaint of constant nausea for the past 2 days leading to poor oral intake, increased weakness, impaired gait, decreased urine output. He has chronic diarrhea due to colon cancer, has low appetite and was only able to tolerate liquid diet.  He is on chronic calcium and magnesium supplementation.  For the past 2 to 3 weeks, patient has also noted right paralumbar pain worse with movement and better at rest.    In the ED, patient was afebrile, hemodynamically stable. Labs showed low serum calcium and magnesium level. He was noted to have urinary retention of more than 470 ml.  Foley catheter was inserted. Admitted to hospital service for further evaluation management.   Has also been having episodes of bloody bowel movements in setting of Eliquis use.  Eliquis is currently on hold.  Hemoglobin remains stable.   Acute on chronic hypocalcemia, hypomagnesemia Stable now.  They were replaced as needed.  A-fib with RVR, new onset  with paroxysmal A-fib -He had an episode of A-fib with RVR on the morning of 11/23/2021, initially converted to normal sinus rhythm on Cardizem drip and subsequently switched to oral Cardizem.  Converted back into A-fib RVR and resumed back on Cardizem drip.  And subsequently transition to oral Cardizem on 11/28/2021.  Cardiology Dr. Bonney Roussel team was following. -CHA2DS2-VASc score 5, recommend anticoagulation.  Started on Eliquis, but this was discontinued in setting of worsening bloody bowel movements.  Patient reportedly had any further rectal bleeding and his hemoglobin has  remained stable for last several days so he was restarted on heparin on 11/28/2021.  Cardiology started him on and recommends continuing Multaq.  He has been on heparin for 3 days and has not had any bleeding and hemoglobin has remained stable so we are going to transition him to Eliquis and will discharge on that, Multaq as well as long-acting Cardizem twice daily per cardiology recommendation and cardiology will follow-up with him in 2 weeks.   Delirium and sundowning in setting of undiagnosed dementia Patient fully alert and oriented.  This was treated with Seroquel nightly.  Patient has remained fully alert and oriented for last 3 days.   History of rectal cancer -3/21 rectal cancer diagnosed after seen colonoscopy.  Completed total neoadjuvant treatment with Dr. Tami Lin.  Finished radiation in 11/21.  Underwent APR on January 2022 with a diverting ileostomy.  May 2022, underwent ileostomy reversal.  Follow-up endoscopy recently showed an anastomotic polyp biopsy of which showed adenocarcinoma. -General surgery planned for permanent colostomy but the surgery had to be canceled twice.  First time was because of poor bowel prep and second time on 11/10/2021 was because of hypocalcemia.   -Per Dr. Marcello Moores, could consider a diverting ostomy in the hospital, if pt proceeding to a palliative situation -Currently abdominoperineal resection scheduled for 8/11 -Bloody bowel movement.  Repeat hemoglobin is stable    Pulmonary nodule -CT angio of chest showed slowly enlarging solid right upper lobe pulmonary nodule since last check, now 6 to 7 mm, this is more suspicious for bronchogenic carcinoma then solitary lung metastasis.  Underwent bronchoscopy by Dr. Valeta Harms today.  Pathology pending, patient already has a scheduled appointment with radiation/Dr. Tammi Klippel next Tuesday.   Hypertension -Controlled, Cardizem   Chronic diarrhea/constipation -Questran   Demand ischemia -No symptoms of ACS -Aspirin on hold  due to bloody bowel movement   HLD -Crestor    AKI on CKD stage II, history of IgA nephropathy -Baseline creatinine 0.9-1 -AKI resolving.  BMP this morning pending.   Chronic L2 compression fracture -Reported several weeks of right paralumbar spinal pain worse with movement and better at rest -Lumbar spine x-ray showed chronic compression fracture deformity at the level of L2. Moderate to marked severity multilevel degenerative disc disease   Bilateral hydroureteronephrosis -CT abdomen noted bilateral hydroureteronephrosis secondary to urinary retention likely at the level of bladder neck.  Patient had Foley catheter inserted here which was removed this morning, patient had successful voiding this afternoon.  Discharging on Flomax   GERD -PPI   Anemia of chronic disease: Hemoglobin stable.   Chronic thrombocytopenia: Platelets stable.  No bleeding.  Discharge plan was discussed with patient and/or family member and they verbalized understanding and agreed with it.  Discharge Diagnoses:  Principal Problem:   Hypocalcemia Active Problems:   CAD (coronary artery disease)   Essential hypertension   S/P CABG x 4   Hypomagnesemia   Rectal cancer (HCC)   Acute renal failure superimposed on  stage 3b chronic kidney disease (HCC)   Hydroureteronephrosis   Lumbar back pain   Elevated troponin   Protein-calorie malnutrition, severe   PAF (paroxysmal atrial fibrillation) ([place])   Lung nodule    Discharge Instructions  Discharge Instructions     Ambulatory referral to Radiation Oncology   Complete by: As directed    Dr. Tammi Klippel      Allergies as of 12/01/2021   No Known Allergies      Medication List     STOP taking these medications    AMLODIPINE BESYLATE PO   aspirin EC 81 MG tablet   atenolol 50 MG tablet Commonly known as: TENORMIN   valsartan 160 MG tablet Commonly known as: DIOVAN       TAKE these medications    apixaban 5 MG Tabs tablet Commonly  known as: ELIQUIS Take 1 tablet (5 mg total) by mouth 2 (two) times daily.   b complex vitamins capsule Take 1 capsule by mouth daily.   CALCIUM PO Take 1 tablet by mouth daily.   diltiazem 90 MG 12 hr capsule Commonly known as: CARDIZEM SR Take 1 capsule (90 mg total) by mouth 2 (two) times daily.   dronedarone 400 MG tablet Commonly known as: MULTAQ Take 1 tablet (400 mg total) by mouth 2 (two) times daily with a meal.   MAGNESIUM PO Take 1 tablet by mouth daily.   nitroGLYCERIN 0.4 MG SL tablet Commonly known as: NITROSTAT Place 0.4 mg under the tongue every 5 (five) minutes x 3 doses as needed for chest pain.   omeprazole 40 MG capsule Commonly known as: PRILOSEC TAKE 1 CAPSULE (40 MG TOTAL) BY MOUTH DAILY.   rosuvastatin 20 MG tablet Commonly known as: CRESTOR TAKE 1 TABLET BY MOUTH EVERY DAY   VITAMIN A PO Take 1 tablet by mouth daily.   VITAMIN C PO Take 1 tablet by mouth daily.   VITAMIN D-3 PO Take 1 capsule by mouth daily.   VITAMIN E PO Take 1 capsule by mouth daily at 6 (six) AM.        Contact information for follow-up providers     Icard, Bradley L, DO. Schedule an appointment as soon as possible for a visit.   Specialty: Pulmonary Disease Why: follow from hospital to see Dr. Valeta Harms or Eric Form, NP Contact information: [place] [address] [phone]         Nigel Mormon, MD Follow up on 12/08/2021.   Specialties: Cardiology, Radiology Why: 3:00 PM Contact information: [place] [address] [phone]         Caprice Red, MD Follow up in 1 week(s).   Specialty: Pulmonary Disease Contact information: [address], [place] [address] [phone]              Contact information for after-discharge care     Destination     [facility] Preferred SNF .   Service: Skilled Nursing Contact information: [phone] [place] [phone]                    No Known Allergies  Consultations: Cardiology, pulmonary   Procedures/Studies: DG Chest Port 1 View  Result Date: 11/30/2021 CLINICAL DATA:  Postop bronch. EXAM: PORTABLE CHEST 1 VIEW COMPARISON:  Chest x-ray 11/21/2021. FINDINGS: No consolidation. Known right upper lobe pulmonary nodule better characterized on recent CT chest. No  visible pneumothorax. Cardiomediastinal silhouette is similar prior. Median sternotomy and CABG. Similar position of right IJ Port-A-Cath with tip at the superior cavoatrial junction. IMPRESSION: No visible pneumothorax. Electronically Signed   By: Margaretha Sheffield M.D.   On: 11/30/2021 11:41   DG C-ARM BRONCHOSCOPY  Result Date: 11/30/2021 C-ARM BRONCHOSCOPY: Fluoroscopy was utilized by the requesting physician.  No radiographic interpretation.   CT Angio Chest Pulmonary Embolism (PE) W or WO Contrast  Result Date: 11/23/2021 CLINICAL DATA:  76 year old male with abnormal D-dimer. Recent abdominal pain. History of colon cancer. Back pain. EXAM: CT ANGIOGRAPHY CHEST WITH CONTRAST TECHNIQUE: Multidetector CT imaging of the chest was performed using the standard protocol during bolus administration of intravenous contrast. Multiplanar CT image reconstructions and MIPs were obtained to evaluate the vascular anatomy. RADIATION DOSE REDUCTION: This exam was performed according to the departmental dose-optimization program which includes automated exposure control, adjustment of the mA and/or kV according to patient size and/or use of iterative reconstruction technique. CONTRAST:  80m OMNIPAQUE IOHEXOL 350 MG/ML SOLN COMPARISON:  CT Abdomen and Pelvis 2 days ago. Restaging chest CT 06/11/2021. FINDINGS: Cardiovascular: Good contrast bolus timing in the pulmonary arterial tree. No focal filling defect identified in the pulmonary arteries to suggest acute pulmonary embolism. Prior CABG.  Calcified aortic atherosclerosis. Stable cardiac size, within normal limits. No pericardial effusion. Right chest IJ approach Port-A-Cath is stable. Mediastinum/Nodes: No mediastinal mass or lymphadenopathy identified. Lungs/Pleura: Major airways are patent. Mildly lower lung volumes compared to JJan 23, 2024 Overall stable ventilation and left lung base scarring. No significant pleural effusion. However, a 6-7 mm right upper lobe pulmonary nodule on series 7, image 29 appears slightly larger since J01/23/24(approximally 5 mm at that time). And this was only punctate on a previous CT 10/05/2020. No other pulmonary nodule or suspicious pulmonary opacity. Upper Abdomen: Pronounced gastric wall thickening seems to be a chronic finding, very similar to the chest CT appearance in J23-Jan-2024 more pronounced from yesterday although the stomach is also more decompressed now. Otherwise stable visible upper abdominal viscera including cholelithiasis. No free air or free fluid in the visible upper abdomen. Musculoskeletal: Prior sternotomy. Osteopenia and thoracic spine degeneration. Chronic T3 and L2 compression fractures appear stable from last year. Chronic right lateral rib fractures are stable. No acute osseous abnormality identified. Review of the MIP images confirms the above findings. IMPRESSION: 1. Negative for acute pulmonary embolus. 2. Slowly enlarging solid right upper lobe pulmonary nodule since last year, now 6-7 mm. This is more suspicious for Bronchogenic Carcinoma than solitary lung metastasis. [facility]. 3. No other acute finding in the chest.  Prior CABG. 4. Stable visible upper abdomen. Electronically Signed   By: HGenevie AnnM.D.   On: 11/23/2021 06:53   ECHOCARDIOGRAM COMPLETE  Result Date: 11/22/2021    ECHOCARDIOGRAM REPORT   Patient Name:   RATHARVA MIRSKYWARD Date of Exam: 11/22/2021 Medical Rec #:  [id number]   Height:       68.0 in Accession #:     [id number]  Weight:       123.2 lb Date of Birth:  901-Aug-1947   BSA:          1.664 m Patient Age:    89years     BP:           140/66 mmHg Patient Gender: M            HR:  84 bpm. Exam Location:  Inpatient Procedure: 2D Echo, Color Doppler and Cardiac Doppler Indications:    Elevated trop  History:        Patient has prior history of Echocardiogram examinations, most                 recent 03/25/2020. CAD, Prior CABG; Risk Factors:Hypertension and                 HLD.  Sonographer:    Joette Catching RCS Referring Phys: [id number] [place]  1. Left ventricular ejection fraction, by estimation, is 60 to 65%. The left ventricle has normal function. The left ventricle has no regional wall motion abnormalities. There is mild left ventricular hypertrophy. Left ventricular diastolic parameters are consistent with Grade I diastolic dysfunction (impaired relaxation).  2. Right ventricular systolic function is normal. The right ventricular size is normal.  3. Left atrial size was moderately dilated.  4. The mitral valve is normal in structure. Mild mitral valve regurgitation. No evidence of mitral stenosis.  5. The aortic valve is tricuspid. Aortic valve regurgitation is not visualized. Aortic valve sclerosis is present, with no evidence of aortic valve stenosis.  6. Aortic dilatation noted. There is borderline dilatation of the ascending aorta, measuring 39 mm.  7. The inferior vena cava is normal in size with greater than 50% respiratory variability, suggesting right atrial pressure of 3 mmHg. Comparison(s): No prior Echocardiogram. FINDINGS  Left Ventricle: Left ventricular ejection fraction, by estimation, is 60 to 65%. The left ventricle has normal function. The left ventricle has no regional wall motion abnormalities. The left ventricular internal cavity size was normal in size. There is  mild left ventricular hypertrophy. Left ventricular diastolic parameters are consistent with Grade I  diastolic dysfunction (impaired relaxation). Right Ventricle: The right ventricular size is normal. Right ventricular systolic function is normal. Left Atrium: Left atrial size was moderately dilated. Right Atrium: Right atrial size was normal in size. Pericardium: There is no evidence of pericardial effusion. Mitral Valve: The mitral valve is normal in structure. Mild mitral valve regurgitation. No evidence of mitral valve stenosis. Tricuspid Valve: The tricuspid valve is normal in structure. Tricuspid valve regurgitation is mild . No evidence of tricuspid stenosis. Aortic Valve: The aortic valve is tricuspid. Aortic valve regurgitation is not visualized. Aortic valve sclerosis is present, with no evidence of aortic valve stenosis. Aortic valve mean gradient measures 5.0 mmHg. Aortic valve peak gradient measures 6.8  mmHg. Aortic valve area, by VTI measures 3.24 cm. Pulmonic Valve: The pulmonic valve was normal in structure. Pulmonic valve regurgitation is not visualized. No evidence of pulmonic stenosis. Aorta: The aortic root is normal in size and structure and aortic dilatation noted. There is borderline dilatation of the ascending aorta, measuring 39 mm. Venous: The inferior vena cava is normal in size with greater than 50% respiratory variability, suggesting right atrial pressure of 3 mmHg. IAS/Shunts: No atrial level shunt detected by color flow Doppler.  LEFT VENTRICLE PLAX 2D LVIDd:         4.20 cm   Diastology LVIDs:         3.10 cm   LV e' medial:    7.07 cm/s LV PW:         1.20 cm   LV E/e' medial:  13.4 LV IVS:        1.10 cm   LV e' lateral:   9.25 cm/s LVOT diam:     2.10 cm   LV E/e'  lateral: 10.2 LV SV:         70 LV SV Index:   42 LVOT Area:     3.46 cm  RIGHT VENTRICLE             IVC RV Basal diam:  3.20 cm     IVC diam: 1.50 cm RV Mid diam:    1.90 cm RV S prime:     23.60 cm/s TAPSE (M-mode): 1.5 cm LEFT ATRIUM             Index        RIGHT ATRIUM           Index LA diam:        4.00 cm  2.40 cm/m   RA Area:     14.90 cm LA Vol (A2C):   68.0 ml 40.87 ml/m  RA Volume:   35.40 ml  21.28 ml/m LA Vol (A4C):   65.6 ml 39.43 ml/m LA Biplane Vol: 71.2 ml 42.80 ml/m  AORTIC VALVE AV Area (Vmax):    2.74 cm AV Area (Vmean):   2.34 cm AV Area (VTI):     3.24 cm AV Vmax:           130.00 cm/s AV Vmean:          106.000 cm/s AV VTI:            0.215 m AV Peak Grad:      6.8 mmHg AV Mean Grad:      5.0 mmHg LVOT Vmax:         103.00 cm/s LVOT Vmean:        71.700 cm/s LVOT VTI:          0.201 m LVOT/AV VTI ratio: 0.93  AORTA Ao Root diam: 3.40 cm Ao Asc diam:  3.90 cm MITRAL VALVE               TRICUSPID VALVE MV Area (PHT): 4.21 cm    TR Peak grad:   38.7 mmHg MV Decel Time: 180 msec    TR Vmax:        311.00 cm/s MR Peak grad: 124.5 mmHg MR Mean grad: 94.0 mmHg    SHUNTS MR Vmax:      558.00 cm/s  Systemic VTI:  0.20 m MR Vmean:     473.0 cm/s   Systemic Diam: 2.10 cm MV E velocity: 94.70 cm/s MV A velocity: 78.40 cm/s MV E/A ratio:  1.21 Kirk Ruths MD Electronically signed by Kirk Ruths MD Signature Date/Time: 11/22/2021/2:49:08 PM    Final    DG Lumbar Spine 1 View  Result Date: 11/22/2021 CLINICAL DATA:  Back pain. EXAM: LUMBAR SPINE - 1 VIEW COMPARISON:  None Available. FINDINGS: There is no evidence of an acute lumbar spine fracture. A chronic compression fracture deformity is seen at the level of L2. Alignment is normal. Moderate to marked severity multilevel endplate sclerosis and anterior osteophyte formation are seen throughout all levels of the lumbar spine. Mild multilevel intervertebral disc space narrowing is also seen. IMPRESSION: 1. Chronic compression fracture deformity at the level of L2. 2. Moderate to marked severity multilevel degenerative disc disease. Electronically Signed   By: Virgina Norfolk M.D.   On: 11/22/2021 00:32   CT Abdomen Pelvis W Contrast  Result Date: 11/21/2021 CLINICAL DATA:  Acute nonlocalized abdominal pain. History of colon cancer. Nausea.  Weakness. EXAM: CT ABDOMEN AND PELVIS WITH CONTRAST TECHNIQUE: Multidetector CT imaging of the abdomen and pelvis was performed  using the standard protocol following bolus administration of intravenous contrast. RADIATION DOSE REDUCTION: This exam was performed according to the departmental dose-optimization program which includes automated exposure control, adjustment of the mA and/or kV according to patient size and/or use of iterative reconstruction technique. CONTRAST:  83m OMNIPAQUE IOHEXOL 300 MG/ML  SOLN COMPARISON:  Radiograph earlier today.  Most recent CT 06/11/2021 FINDINGS: Lower chest: No acute findings. Normal heart size with coronary artery calcifications. Hepatobiliary: No focal hepatic lesion. Gallstones within the gallbladder. No abnormal gallbladder distention or inflammation. No biliary dilatation. Pancreas: Grossly negative, although suboptimally assessed due to motion, lack of enteric contrast and paucity of intra-abdominal fat. Spleen: Normal in size without focal abnormality. Adrenals/Urinary Tract: No adrenal nodule. Symmetric bilateral hydroureteronephrosis. Ureters are dilated to the bladder insertion. No ureteral stone or external cause for obstruction. Moderately distended urinary bladder with bladder volume = 470 cm^3. No bladder wall thickening. Small bilateral renal cysts, needing no dedicated follow-up. Punctate bilateral intrarenal calculi. No bladder stones. No urethral stone visualized. Stomach/Bowel: Diffuse and fairly prominent gastric wall thickening, a chronic finding. Small hiatal hernia with wall thickening of the distal esophagus. There is a duodenal diverticulum. No small bowel obstruction or abnormal distention. Enteric sutures noted in the right lower quadrant. Enteric sutures noted in the rectosigmoid colon. Minimal colonic stool. Colonic diverticulosis without focal diverticulitis. No acute colonic inflammation. Normal appendix tentatively visualized, right.  Regardless, no appendicitis. Similar presacral soft tissue thickening, 18 mm. Vascular/Lymphatic: Aortic and branch atherosclerosis. No aortic aneurysm. Moderate atherosclerosis of mesenteric vasculature, branches appear patent. Patent portal vein. No abdominopelvic adenopathy. Reproductive: Mildly enlarged prostate. Other: Stable presacral soft tissue density. No ascites or free air. Musculoskeletal: Chronic L2 compression deformity, unchanged. No acute osseous abnormalities or focal bone lesion. IMPRESSION: 1. Symmetric bilateral hydroureteronephrosis, ureters both dilated to the bladder insertion. No ureteral stone or external cause for obstruction. Moderately distended urinary bladder with bladder volume = 470 cm. Recommend correlation with urinalysis to exclude urinary tract infection. 2. Punctate bilateral intrarenal calculi. 3. Chronic diffuse gastric wall thickening, unchanged from prior exams. Small hiatal hernia with wall thickening of the distal esophagus, can be seen with reflux or esophagitis. 4. Colonic diverticulosis without focal diverticulitis. 5. Cholelithiasis without gallbladder inflammation. 6. Additional stable chronic findings as described. Aortic Atherosclerosis (ICD10-I70.0). Electronically Signed   By: MKeith RakeM.D.   On: 11/21/2021 21:59   CT Head Wo Contrast  Result Date: 11/21/2021 CLINICAL DATA:  Worsening headaches EXAM: CT HEAD WITHOUT CONTRAST TECHNIQUE: Contiguous axial images were obtained from the base of the skull through the vertex without intravenous contrast. RADIATION DOSE REDUCTION: This exam was performed according to the departmental dose-optimization program which includes automated exposure control, adjustment of the mA and/or kV according to patient size and/or use of iterative reconstruction technique. COMPARISON:  12/14/2012 FINDINGS: Brain: No evidence of acute infarction, hemorrhage, hydrocephalus, extra-axial collection or mass lesion/mass effect. Mild  atrophic and chronic white matter ischemic changes are noted. Vascular: No hyperdense vessel or unexpected calcification. Skull: Normal. Negative for fracture or focal lesion. Sinuses/Orbits: No acute finding. Other: None. IMPRESSION: Chronic atrophic and ischemic changes without acute abnormality. Electronically Signed   By: MInez CatalinaM.D.   On: 11/21/2021 21:51   DG ABD ACUTE 2+V W 1V CHEST  Result Date: 11/21/2021 CLINICAL DATA:  Nausea and vomiting.  Fatigue. EXAM: DG ABDOMEN ACUTE WITH 1 VIEW CHEST COMPARISON:  Chest and abdominopelvic CT 06/11/2021 FINDINGS: Right chest port in place with tip in the SVC.  Post median sternotomy and CABG. Stable upper normal heart size, unchanged mediastinal contours. No acute airspace disease. No pneumothorax or pleural effusion. No free intra-abdominal air. No bowel dilatation to suggest obstruction. Enteric sutures are noted in the right abdomen. Known punctate intrarenal calculi on CT are not definitively defined by radiograph. There are vascular calcifications. The bones are diffusely under mineralized. No acute osseous abnormalities are seen. IMPRESSION: 1. No acute chest findings. 2. Normal bowel gas pattern. No free air. Electronically Signed   By: Keith Rake M.D.   On: 11/21/2021 19:02     Discharge Exam: Vitals:   12/01/21 0303 12/01/21 0820  BP: 140/66 (!) 114/53  Pulse: 99 83  Resp: 16 18  Temp: 98.3 F (36.8 C) 97.6 F (36.4 C)  SpO2: 98% 98%   Vitals:   11/30/21 2020 11/30/21 2348 12/01/21 0303 12/01/21 0820  BP: 134/71 128/73 140/66 (!) 114/53  Pulse: 90 93 99 83  Resp: '20 18 16 18  '$ Temp: 98.6 F (37 C) 98.1 F (36.7 C) 98.3 F (36.8 C) 97.6 F (36.4 C)  TempSrc: Oral Oral Oral Oral  SpO2: 97% 96% 98% 98%  Weight:      Height:        General: Pt is alert, awake, not in acute distress Cardiovascular: RRR, S1/S2 +, no rubs, no gallops Respiratory: CTA bilaterally, no wheezing, no rhonchi Abdominal: Soft, NT, ND, bowel  sounds + Extremities: no edema, no cyanosis    The results of significant diagnostics from this hospitalization (including imaging, microbiology, ancillary and laboratory) are listed below for reference.     Microbiology: Recent Results (from the past 240 hour(s))  SARS Coronavirus 2 by RT PCR (hospital order, performed in [facility] hospital lab) *cepheid single result test* Anterior Nasal Swab     Status: None   Collection Time: 11/30/21  8:17 AM   Specimen: Anterior Nasal Swab  Result Value Ref Range Status   SARS Coronavirus 2 by RT PCR NEGATIVE NEGATIVE Final    Comment: (NOTE) SARS-CoV-2 target nucleic acids are NOT DETECTED.  The SARS-CoV-2 RNA is generally detectable in upper and lower respiratory specimens during the acute phase of infection. The lowest concentration of SARS-CoV-2 viral copies this assay can detect is 250 copies / mL. A negative result does not preclude SARS-CoV-2 infection and should not be used as the sole basis for treatment or other patient management decisions.  A negative result may occur with improper specimen collection / handling, submission of specimen other than nasopharyngeal swab, presence of viral mutation(s) within the areas targeted by this assay, and inadequate number of viral copies (<250 copies / mL). A negative result must be combined with clinical observations, patient history, and epidemiological information.  Fact Sheet for Patients:   https://www.patel.info/  Fact Sheet for Healthcare Providers: https://hall.com/  This test is not yet approved or  cleared by the [place] FDA and has been authorized for detection and/or diagnosis of SARS-CoV-2 by FDA under an Emergency Use Authorization (EUA).  This EUA will remain in effect (meaning this test can be used) for the duration of the COVID-19 declaration under Section 564(b)(1) of the Act, 21 U.S.C. section 360bbb-3(b)(1), unless the  authorization is terminated or revoked sooner.  Performed at [facility], [place] [address], [place], [place] [address]      Labs: BNP (last 3 results) No results for input(s): "BNP" in the last 8760 hours. Basic Metabolic Panel: Recent Labs  Lab 11/26/21 0424 11/27/21 0354  11/28/21 0536 11/29/21 0152 11/30/21 0547 12/01/21 0455  NA 137 138 137 136 137  --   K 4.5 4.5 3.8 4.1 4.1  --   CL 107 112* 108 108 105  --   CO2 20* 21* 22 21* 24  --   GLUCOSE 155* 97 100* 116* 92  --   BUN 26* '21 16 21 22  '$ --   CREATININE 1.25* 1.12 1.15 1.58* 1.36*  --   CALCIUM 9.1 8.2* 8.5* 8.8* 9.0  --   MG 1.7 2.0 1.6* 1.6* 1.7 1.9   Liver Function Tests: Recent Labs  Lab 11/25/21 0423  AST 25  ALT 20  ALKPHOS 62  BILITOT 0.8  PROT 6.0*  ALBUMIN 3.1*   No results for input(s): "LIPASE", "AMYLASE" in the last 168 hours. No results for input(s): "AMMONIA" in the last 168 hours. CBC: Recent Labs  Lab 11/26/21 0424 11/27/21 0354 11/29/21 0152 11/30/21 0547 12/01/21 0455  WBC 10.2 8.0 5.5 5.6 8.2  HGB 11.2* 9.2* 9.4* 9.6* 9.6*  HCT 33.8* 28.5* 28.3* 28.4* 27.9*  MCV 96.8 97.6 96.6 95.6 94.9  PLT 139* 102* 112* 135* 137*   Cardiac Enzymes: No results for input(s): "CKTOTAL", "CKMB", "CKMBINDEX", "TROPONINI" in the last 168 hours. BNP: Invalid input(s): "POCBNP" CBG: No results for input(s): "GLUCAP" in the last 168 hours. D-Dimer No results for input(s): "DDIMER" in the last 72 hours. Hgb A1c No results for input(s): "HGBA1C" in the last 72 hours. Lipid Profile No results for input(s): "CHOL", "HDL", "LDLCALC", "TRIG", "CHOLHDL", "LDLDIRECT" in the last 72 hours. Thyroid function studies No results for input(s): "TSH", "T4TOTAL", "T3FREE", "THYROIDAB" in the last 72 hours.  Invalid input(s): "FREET3" Anemia work up No results for input(s): "VITAMINB12", "FOLATE", "FERRITIN", "TIBC", "IRON", "RETICCTPCT" in the last 72 hours. Urinalysis    Component Value  Date/Time   COLORURINE YELLOW 11/21/2021 2104   APPEARANCEUR CLEAR 11/21/2021 2104   LABSPEC 1.014 11/21/2021 2104   PHURINE 5.0 11/21/2021 2104   GLUCOSEU NEGATIVE 11/21/2021 2104   HGBUR SMALL (A) 11/21/2021 2104   BILIRUBINUR NEGATIVE 11/21/2021 2104   BILIRUBINUR negative 01/21/2016 1040   KETONESUR 20 (A) 11/21/2021 2104   PROTEINUR 100 (A) 11/21/2021 2104   UROBILINOGEN 0.2 01/21/2016 1040   UROBILINOGEN 1 11/21/2013 1008   NITRITE NEGATIVE 11/21/2021 2104   LEUKOCYTESUR NEGATIVE 11/21/2021 2104   Sepsis Labs Recent Labs  Lab 11/27/21 0354 11/29/21 0152 11/30/21 0547 12/01/21 0455  WBC 8.0 5.5 5.6 8.2   Microbiology Recent Results (from the past 240 hour(s))  SARS Coronavirus 2 by RT PCR (hospital order, performed in [facility] hospital lab) *cepheid single result test* Anterior Nasal Swab     Status: None   Collection Time: 11/30/21  8:17 AM   Specimen: Anterior Nasal Swab  Result Value Ref Range Status   SARS Coronavirus 2 by RT PCR NEGATIVE NEGATIVE Final    Comment: (NOTE) SARS-CoV-2 target nucleic acids are NOT DETECTED.  The SARS-CoV-2 RNA is generally detectable in upper and lower respiratory specimens during the acute phase of infection. The lowest concentration of SARS-CoV-2 viral copies this assay can detect is 250 copies / mL. A negative result does not preclude SARS-CoV-2 infection and should not be used as the sole basis for treatment or other patient management decisions.  A negative result may occur with improper specimen collection / handling, submission of specimen other than nasopharyngeal swab, presence of viral mutation(s) within the areas targeted by this assay, and inadequate number of viral  copies (<250 copies / mL). A negative result must be combined with clinical observations, patient history, and epidemiological information.  Fact Sheet for Patients:   https://www.patel.info/  Fact Sheet for Healthcare  Providers: https://hall.com/  This test is not yet approved or  cleared by the [place] FDA and has been authorized for detection and/or diagnosis of SARS-CoV-2 by FDA under an Emergency Use Authorization (EUA).  This EUA will remain in effect (meaning this test can be used) for the duration of the COVID-19 declaration under Section 564(b)(1) of the Act, 21 U.S.C. section 360bbb-3(b)(1), unless the authorization is terminated or revoked sooner.  Performed at [facility], [place] [address] [place], [place] [address]      Time coordinating discharge: Over 30 minutes  SIGNED:   Darliss Cheney, MD  [facility] 12/01/2021, 10:19 AM *Please note that this is a verbal dictation therefore any spelling or grammatical errors are due to the "[place] [employer]" system interpretation. If 7PM-7AM, please contact night-coverage [email or web address]

## 2021-12-01 NOTE — Progress Notes (Signed)
Receiving RN Delana Meyer C called back. Report given.

## 2021-12-01 NOTE — Progress Notes (Addendum)
RN attempted to call report to [facility].   [id number] : RN attempted to call report to [facility]. X2

## 2021-12-01 NOTE — Plan of Care (Signed)

## 2021-12-01 NOTE — TOC Transition Note (Signed)
Transition of Care [facility]) - CM/SW Discharge Note   Patient Details  Name: Jared White MRN: [id number] Date of Birth: [birth year]  Transition of Care [facility]) CM/SW Contact:  Tresa Endo Phone Number: 12/01/2021, 3:06 PM   Clinical Narrative:    Patient will DC to: [facility] Anticipated DC date: 12/01/2021 Family notified: Pt brother in Systems developer by: Corey Harold   Per MD patient ready for DC to [facility] room 216. RN to call report prior to discharge ([phone]). RN, patient, patient's family, and facility notified of DC. Discharge Summary and FL2 sent to facility. DC packet on chart. Ambulance transport requested for patient.   CSW will sign off for now as social work intervention is no longer needed. Please consult [place] again if new needs arise.     Final next level of care: Skilled Nursing Facility Barriers to Discharge: Continued Medical Work up   Patient Goals and CMS Choice Patient states their goals for this hospitalization and ongoing recovery are:: Rehab CMS Medicare.gov Compare Post Acute Care list provided to:: Patient Choice offered to / list presented to : Patient  Discharge Placement                       Discharge Plan and Services In-house Referral: Clinical Social Work   Post Acute Care Choice: [place]                               Social Determinants of Health (SDOH) Interventions     Readmission Risk Interventions    08/26/2020    1:33 PM  Readmission Risk Prevention Plan  Transportation Screening Complete  PCP or Specialist Appt within 3-5 Days Complete  HRI or [place] Complete  Social Work Consult for [place] Planning/Counseling Complete  Palliative Care Screening Not Applicable  Medication Review Press photographer) Complete

## 2021-12-02 LAB — CYTOLOGY - NON PAP

## 2021-12-03 ENCOUNTER — Other Ambulatory Visit: Payer: Self-pay | Admitting: General Surgery

## 2021-12-06 ENCOUNTER — Other Ambulatory Visit: Payer: Self-pay | Admitting: General Surgery

## 2021-12-06 DIAGNOSIS — C20 Malignant neoplasm of rectum: Secondary | ICD-10-CM

## 2021-12-07 ENCOUNTER — Ambulatory Visit: Payer: Medicare Other

## 2021-12-07 ENCOUNTER — Ambulatory Visit
Admit: 2021-12-07 | Discharge: 2021-12-07 | Disposition: A | Discharge status: Home / Self Care | Payer: Medicare Other | Attending: Radiation Oncology | Admitting: Radiation Oncology

## 2021-12-08 ENCOUNTER — Other Ambulatory Visit: Payer: Self-pay | Admitting: Oncology

## 2021-12-08 ENCOUNTER — Telehealth: Payer: Self-pay | Admitting: Pulmonary Disease

## 2021-12-08 ENCOUNTER — Telehealth: Payer: Self-pay | Admitting: Oncology

## 2021-12-08 ENCOUNTER — Ambulatory Visit: Payer: Medicare Other | Admitting: Cardiology

## 2021-12-08 DIAGNOSIS — C189 Malignant neoplasm of colon, unspecified: Secondary | ICD-10-CM

## 2021-12-08 NOTE — Progress Notes (Signed)
Recent imaging studies as well as recent pathology were personally reviewed.  He has developed an isolated pulmonary nodule that is measuring 6 to 7 mm growth over the last year and overall no evidence of metastatic disease in August.  He did undergo bronchoscopy and biopsy by Dr. Valeta Harms on November 30, 2021.  His results discussed with Dr. Marcello Moores as well as Dr. Tammi Klippel.  I recommended proceeding with local therapy for his isolated pulmonary nodule and deferring any additional systemic therapy for the time being.  I also think it is reasonable to defer APR surgery given his progressive disease.  I will last set up follow-up for the patient near future to discuss with him as well.

## 2021-12-08 NOTE — Progress Notes (Signed)
Jared White, I called and spoke with the patient via phone.  Referral placed to Dr. Burr Medico  Thanks,  [place], DO [facility] 12/08/2021 2:21 PM

## 2021-12-08 NOTE — Telephone Encounter (Signed)
PCCM:  Pathology reviewed.  Lung nodule biopsy consistent with metastatic colon primary.  Referral placed to medical oncology.  Garner Nash, DO [facility] 12/08/2021 2:20 PM

## 2021-12-08 NOTE — Telephone Encounter (Signed)
Scheduled appt per 7/19 referral. Spoke with facility that pt stays at, they are aware of date/time.

## 2021-12-09 ENCOUNTER — Encounter: Payer: Self-pay | Admitting: Cardiology

## 2021-12-09 ENCOUNTER — Ambulatory Visit: Payer: Medicare Other | Admitting: Cardiology

## 2021-12-09 VITALS — BP 133/62 | HR 70 | Temp 98.0°F | Resp 16 | Ht 68.0 in | Wt 120.0 lb

## 2021-12-09 DIAGNOSIS — I2510 Atherosclerotic heart disease of native coronary artery without angina pectoris: Secondary | ICD-10-CM

## 2021-12-09 DIAGNOSIS — I480 Paroxysmal atrial fibrillation: Secondary | ICD-10-CM

## 2021-12-09 MED ORDER — APIXABAN 2.5 MG PO TABS: 2.5000 mg | ORAL_TABLET | Freq: Two times a day (BID) | ORAL | 1 refills | Status: DC

## 2021-12-09 NOTE — Progress Notes (Signed)
Subjective:   Jared White, male    DOB: 06-22-1945, 76 y.o.   MRN: [id number]  Chief complaint:  Coronary artery disease    HPI  76 year old male with coronary artery disease status post CABG 2001 by Dr. Prescott Gum (LIMA-LAD, SVG-Diag, seg SVG-ramus & OM, SVG-PDA), hypertension, CKD III w/IgA nephropathy, hyperlipidemia, rectal mass s/p radiation, neoadjuvant treatment, diverting ileostomy s/p reversal with recent anastomotic recurrence, pending diverting ostomy, paroxysmal A-fib, increasing in size pulmonary nodule, recent hospitalization (10/2021) with diarrhea, Afib, trop elevation without chest pain, hospital delirium  Patient is currently at [facility]. He was initially slightly confused stating that he is in the hospital, but later corrected himself. He denies any chest pain, shortness of breath. He is currently taking Eliquis 5 mg bid. He has noticed occasional blood on toilet paper, but has not had any hematochezia or melena. He is uncertain about what the further surgical recommendations are re: anastomotic recurrence. He will be undergoing local therapy for his pulmonary nodule as per Dr. Hazeline Junker recommendations.     Current Outpatient Medications:    apixaban (ELIQUIS) 5 MG TABS tablet, Take 1 tablet (5 mg total) by mouth 2 (two) times daily., Disp: 60 tablet, Rfl: 0   Ascorbic Acid (VITAMIN C PO), Take 1 tablet by mouth daily., Disp: , Rfl:    b complex vitamins capsule, Take 1 capsule by mouth daily., Disp: , Rfl:    CALCIUM PO, Take 1 tablet by mouth daily., Disp: , Rfl:    Cholecalciferol (VITAMIN D-3 PO), Take 1 capsule by mouth daily., Disp: , Rfl:    diltiazem (CARDIZEM SR) 90 MG 12 hr capsule, Take 1 capsule (90 mg total) by mouth 2 (two) times daily., Disp: 60 capsule, Rfl: 0   dronedarone (MULTAQ) 400 MG tablet, Take 1 tablet (400 mg total) by mouth 2 (two) times daily with a meal., Disp: 60 tablet, Rfl: 0   MAGNESIUM PO, Take 1 tablet by mouth daily.,  Disp: , Rfl:    nitroGLYCERIN (NITROSTAT) 0.4 MG SL tablet, Place 0.4 mg under the tongue every 5 (five) minutes x 3 doses as needed for chest pain., Disp: , Rfl:    omeprazole (PRILOSEC) 40 MG capsule, TAKE 1 CAPSULE (40 MG TOTAL) BY MOUTH DAILY., Disp: 30 capsule, Rfl: 3   rosuvastatin (CRESTOR) 20 MG tablet, TAKE 1 TABLET BY MOUTH EVERY DAY (Patient taking differently: Take 20 mg by mouth daily.), Disp: 90 tablet, Rfl: 3   tamsulosin (FLOMAX) 0.4 MG CAPS capsule, Take 1 capsule (0.4 mg total) by mouth daily after supper., Disp: 30 capsule, Rfl: 0   VITAMIN A PO, Take 1 tablet by mouth daily., Disp: , Rfl:    VITAMIN E PO, Take 1 capsule by mouth daily at 6 (six) AM., Disp: , Rfl:     Cardiovascular studies:  EKG 12/09/2021: Sinus rhythm 70 bpm  Nonspecific T-abnormality  Echocardiogram 03/23/2020:  Normal LV systolic function with visual EF 50-55%. Abnormal septal wall  motion due to post-operative coronary artery bypass graft. Left ventricle  cavity is normal in size. Normal global wall motion. Normal diastolic  filling pattern, normal LAP. Calculated EF 54%.  Structurally normal tricuspid valve. No evidence of tricuspid stenosis.  Mild tricuspid regurgitation. No evidence of pulmonary hypertension.  No other significant valvular abnormalities are seen.  Compared to prior study dated 2014: no significant change noted.   Lexiscan (Walking with mod Bruce)Tetrofosmin Stress Test  03/30/2020: Nondiagnostic ECG stress. Converted to walking [place] after  3:00 Bruce attempt due to dyspnea.  Myocardial perfusion is normal with mild diaphragmatic attenuation in inferior wall. minimal ischemia in this region cannot be excluded.  Overall LV systolic function is normal without regional wall motion abnormalities. Stress LV EF: 57%.  No previous exam available for comparison. Low risk.   Recent labs: 12/01/2020: Glucose 100, BUN/Cr 28/1.50. EGFR 49. Na/K 140/3.2. Rest of the CMP normal H/H  10/29. MCV 91. Platelets 129 HbA1C 5.9% No recent lipid panel  03/19/2020: Glucose 115, BUN/Cr 20/1.44. EGFR 51. Na/K 140/3.8. Rest of the CMP normal H/H 12/36. MCV 99. Platelets 121  07/14/2017: H/H 13.7/41.  MCV 92.  Platelets 169 Glucose 119, BUN/creatinine 28/1.81.  EGFR 37.  Sodium 140, potassium 4.7  Review of Systems  Cardiovascular:  Negative for chest pain, dyspnea on exertion, leg swelling, palpitations and syncope.       Vitals:   12/09/21 1104  BP: 133/62  Pulse: 70  Resp: 16  Temp: 98 F (36.7 C)  SpO2: 95%    Physical Exam: Physical Exam Vitals and nursing note reviewed.  Constitutional:      General: He is not in acute distress.    Appearance: He is cachectic.  Neck:     Vascular: No JVD.  Cardiovascular:     Rate and Rhythm: Normal rate and regular rhythm.     Heart sounds: Normal heart sounds. No murmur heard. Pulmonary:     Effort: Pulmonary effort is normal.     Breath sounds: Normal breath sounds. No wheezing or rales.  Musculoskeletal:     Right lower leg: No edema.     Left lower leg: No edema.        ICD-10-CM   1. Coronary artery disease involving native coronary artery of native heart without angina pectoris  I25.10 EKG 12-Lead     Orders Placed This Encounter  Procedures   EKG 12-Lead        Assessment & Recommendations:  76 year old male with coronary artery disease status post CABG 2001 by Dr. Prescott Gum (LIMA-LAD, SVG-Diag, seg SVG-ramus & OM, SVG-PDA), hypertension, CKD III w/IgA nephropathy, hyperlipidemia, rectal mass s/p radiation, neoadjuvant treatment, diverting ileostomy s/p reversal with recent anastomotic recurrence, pending diverting ostomy, paroxysmal A-fib, increasing in size pulmonary nodule, recent hospitalization (11/2021) with diarrhea, Afib, trop elevation without chest pain, hospital delirium  PAF: Maintaining sinus rhythm on Multaq 400 mg twice daily.   Given his upcoming possible diverting ostomy, I would  like to continue Multaq at least in the short-term to maintain sinus rhythm and avoid periprocedural A-fib with RVR. CHA2DS2VASc score at least 5, annual stroke risk >7%.  He was discharged from the hospital on eliquis 5 mg bid. Based on his weight annd renal function, I recommend lower dose of 2.5 mg bid.  That said. Anticoagulation in itself remains tricky with bleeding risk in the setting of his anastamotic recurrence. If possible, we will anticaogulate him till mid August (4 weeks since Afib during hospitalization). I strongly recommend close follow up with Dr. Marcello Moores regarding upcoming surgical plans. While his cardiac risk is elevated owing to his underlying cardiac comorbidities, it is not prohibitive. Benefits of surgery given his anastomotic recurrence may be higher.   CAD: Trop elevation (11/2021) in the setting of Afib w/RVR. Normal LVEF. Not on Aspirin due to ongoing use of eliquis.  Continue Crestor 20 mg.  Hypertension: Controlled  CKD 3: F/u w/nephrology.  F/u in 4 weeks  [place], MD [facility] Pager:  [phone] Office: [phone] If no answer Cell [phone]

## 2021-12-14 ENCOUNTER — Encounter (HOSPITAL_COMMUNITY): Payer: Self-pay | Admitting: Nurse Practitioner

## 2021-12-15 ENCOUNTER — Ambulatory Visit (INDEPENDENT_AMBULATORY_CARE_PROVIDER_SITE_OTHER): Payer: Medicare Other | Admitting: Pulmonary Disease

## 2021-12-15 ENCOUNTER — Telehealth: Payer: Self-pay | Admitting: Pulmonary Disease

## 2021-12-15 ENCOUNTER — Encounter: Payer: Self-pay | Admitting: Pulmonary Disease

## 2021-12-15 ENCOUNTER — Telehealth: Payer: Self-pay | Admitting: Family Medicine

## 2021-12-15 VITALS — BP 94/60 | HR 63 | Ht 68.0 in | Wt 121.6 lb

## 2021-12-15 DIAGNOSIS — C189 Malignant neoplasm of colon, unspecified: Secondary | ICD-10-CM | POA: Diagnosis not present

## 2021-12-15 DIAGNOSIS — R911 Solitary pulmonary nodule: Secondary | ICD-10-CM | POA: Diagnosis not present

## 2021-12-15 DIAGNOSIS — C7800 Secondary malignant neoplasm of unspecified lung: Secondary | ICD-10-CM | POA: Diagnosis not present

## 2021-12-15 NOTE — Telephone Encounter (Signed)
Please advise, see below. Patient scheduled to see you 12/20/21.

## 2021-12-15 NOTE — Telephone Encounter (Signed)
Left patient a detailed voice message for Jared White regarding annotation below and to return call to office if she has any further concerns.

## 2021-12-15 NOTE — Progress Notes (Signed)
Synopsis: Referred in July 2023 for hospital follow-up status post bronchoscopy by Caprice Red, MD  Subjective:   PATIENT ID: Jared White Teel GENDER: male DOB: [birth year], MRN: [id number]  Chief Complaint  Patient presents with   Follow-up    PT hospital f/u, he says he's feeling fine just weak. Experienced some weakness last night, no breathing problems to report    This is a 76 year old gentleman, past medical history of coronary artery disease status post PCI and CABG, history of heart failure, hypertension, hyperlipidemia, rectal cancer.  Also has atrial fibrillation.  Patient had a recent hospital admission.  Had CT imaging of the chest which revealed a new right upper lobe pulmonary nodule that was 7 mm in size it had slowly been enlarging.  I was consulted in the hospital for consideration for bronchoscopy and biopsy.  Patient was taken for robotic assisted navigational bronchoscopy with tissue sampling.  Cytology on the 11/30/2021 revealed malignant adenocarcinoma consistent with colonic primary.  Patient was subsequently referred to medical oncology.  He has seen Dr. Alen Blew from medical oncology.  Also had a post hospital follow-up on 12/09/2021 with cardiology.  Office note reviewed from Dr. Virgina Jock.  Has surgical plans and follow-up with Dr. Marcello Moores already scheduled on 12/31/2021.     Past Medical History:  Diagnosis Date   Arthritis    Blood transfusion without reported diagnosis    Broken back    CAD (coronary artery disease)    PCI & CABG   Cataract    "LIGHT"-RIGHT   CHF (congestive heart failure) (HCC)    Diverticulitis    mild - approx 2004   Dysrhythmia    occasional arrythmias   Family history of heart disease    GERD (gastroesophageal reflux disease)    Heart murmur    HX OF YEARS AGO    History of kidney stones    History of nuclear stress test 06/30/2011   lexiscan; normal pattern of perfusion post-stress; low risk scan    Hyperlipidemia    Hypertension     IgA nephropathy    Irregular heart beat    Myocardial infarction ([facility])    Pre-diabetes    Rectal cancer ([facility]) 08/19/2019   Systemic hypertension      Family History  Problem Relation Age of Onset   Heart attack Father    Kidney failure Brother    Prostate cancer Brother    Heart attack Brother    Heart disease Brother    Hypertension Brother    Heart disease Brother    Hypertension Brother    Colon cancer Neg Hx    Esophageal cancer Neg Hx    Rectal cancer Neg Hx    Stomach cancer Neg Hx    Inflammatory bowel disease Neg Hx    Liver disease Neg Hx    Pancreatic cancer Neg Hx    Colon polyps Neg Hx      Past Surgical History:  Procedure Laterality Date   BRONCHIAL BIOPSY  11/30/2021   Procedure: BRONCHIAL BIOPSIES;  Surgeon: Garner Nash, DO;  Location: [facility];  Service: Pulmonary;;   BRONCHIAL NEEDLE ASPIRATION BIOPSY  11/30/2021   Procedure: BRONCHIAL NEEDLE ASPIRATION BIOPSIES;  Surgeon: Garner Nash, DO;  Location: [facility];  Service: Pulmonary;;   CARDIAC CATHETERIZATION  08/22/1995   PTCA of OM (Dr. Marella Chimes)   COLONOSCOPY     CORONARY ARTERY BYPASS GRAFT  01/22/2000   x5 - LIMA to LAD, SVG to diagonal, sequential SVG  to ramus & OM, SVG to PDA (Dr. Tharon Aquas Trigt)   DIVERTING ILEOSTOMY N/A 06/10/2020   Procedure: DIVERTING LOOP ILEOSTOMY;  Surgeon: Leighton Ruff, MD;  Location: [facility];  Service: General;  Laterality: N/A;   FIDUCIAL MARKER PLACEMENT  11/30/2021   Procedure: FIDUCIAL MARKER PLACEMENT;  Surgeon: Garner Nash, DO;  Location: [facility];  Service: Pulmonary;;   ILEOSTOMY CLOSURE N/A 10/09/2020   Procedure: LOOP ILEOSTOMY REVERSAL;  Surgeon: Leighton Ruff, MD;  Location: [facility];  Service: General;  Laterality: N/A;   IR IMAGING GUIDED PORT INSERTION  09/04/2019   LUNG LOBECTOMY     left upper lobe   RECTAL BIOPSY N/A 07/09/2021   Procedure: BIOPSY RECTAL;  Surgeon: Leighton Ruff, MD;  Location: [facility];  Service:  General;  Laterality: N/A;   RECTAL EXAM UNDER ANESTHESIA N/A 07/09/2021   Procedure: ANAL RECTAL EXAM UNDER ANESTHESIA, RIGID PROCTOSCOPY;  Surgeon: Leighton Ruff, MD;  Location: [facility];  Service: General;  Laterality: N/A;   TRANSTHORACIC ECHOCARDIOGRAM  12/24/2012   EF 55-60%, mild LVH, mild conc hypertrophy; mild MR; LA mildly dilated   XI ROBOTIC ASSISTED LOWER ANTERIOR RESECTION N/A 06/10/2020   Procedure: XI ROBOTIC ASSISTED LOWER ANTERIOR RESECTION, MOBILIZATION OF SPLENIC FLEXURE, RIGID PROCTOSCOPY;  Surgeon: Leighton Ruff, MD;  Location: [facility];  Service: General;  Laterality: N/A;    Social History   Socioeconomic History   Marital status: Single    Spouse name: n/a   Number of children: 0   Years of education: Not on file   Highest education level: Not on file  Occupational History   Occupation: Chief Strategy Officer, [employer] representative  Tobacco Use   Smoking status: Never   Smokeless tobacco: Never  Vaping Use   Vaping Use: Never used  Substance and Sexual Activity   Alcohol use: Never   Drug use: No   Sexual activity: Not on file  Other Topics Concern   Not on file  Social History Narrative   Lives alone.   Sister-in-Law lives next door.   Social Determinants of Health   Financial Resource Strain: Low Risk  (01/22/2020)   Overall Financial Resource Strain (CARDIA)    Difficulty of Paying Living Expenses: Not hard at all  Food Insecurity: No Food Insecurity (01/22/2020)   Hunger Vital Sign    Worried About Running Out of Food in the Last Year: Never true    Ran Out of Food in the Last Year: Never true  Transportation Needs: No Transportation Needs (01/22/2020)   PRAPARE - Hydrologist (Medical): No    Lack of Transportation (Non-Medical): No  Physical Activity: Not on file  Stress: No Stress Concern Present (01/22/2020)   [place]    Feeling of Stress : Only a little  Social  Connections: Socially Isolated (01/22/2020)   Social Connection and Isolation Panel [NHANES]    Frequency of Communication with Friends and Family: More than three times a week    Frequency of Social Gatherings with Friends and Family: More than three times a week    Attends Religious Services: Never    Marine scientist or Organizations: No    Attends Archivist Meetings: Never    Marital Status: Never married  Human resources officer Violence: Not on file     No Known Allergies   Outpatient Medications Prior to Visit  Medication Sig Dispense Refill   apixaban (ELIQUIS) 2.5 MG TABS tablet  Take 1 tablet (2.5 mg total) by mouth 2 (two) times daily. 60 tablet 1   Ascorbic Acid (VITAMIN C PO) Take 1 tablet by mouth daily.     b complex vitamins capsule Take 1 capsule by mouth daily.     CALCIUM PO Take 1 tablet by mouth daily.     Cholecalciferol (VITAMIN D-3 PO) Take 1 capsule by mouth daily.     diltiazem (CARDIZEM SR) 90 MG 12 hr capsule Take 1 capsule (90 mg total) by mouth 2 (two) times daily. 60 capsule 0   dronedarone (MULTAQ) 400 MG tablet Take 1 tablet (400 mg total) by mouth 2 (two) times daily with a meal. 60 tablet 0   MAGNESIUM PO Take 1 tablet by mouth daily.     nitroGLYCERIN (NITROSTAT) 0.4 MG SL tablet Place 0.4 mg under the tongue every 5 (five) minutes x 3 doses as needed for chest pain.     omeprazole (PRILOSEC) 40 MG capsule TAKE 1 CAPSULE (40 MG TOTAL) BY MOUTH DAILY. 30 capsule 3   rosuvastatin (CRESTOR) 20 MG tablet TAKE 1 TABLET BY MOUTH EVERY DAY (Patient taking differently: Take 20 mg by mouth daily.) 90 tablet 3   tamsulosin (FLOMAX) 0.4 MG CAPS capsule Take 1 capsule (0.4 mg total) by mouth daily after supper. 30 capsule 0   VITAMIN A PO Take 1 tablet by mouth daily.     VITAMIN E PO Take 1 capsule by mouth daily at 6 (six) AM.     No facility-administered medications prior to visit.    Review of Systems  Constitutional:  Positive for malaise/fatigue  and weight loss. Negative for chills and fever.  HENT:  Negative for hearing loss, sore throat and tinnitus.   Eyes:  Negative for blurred vision and double vision.  Respiratory:  Positive for shortness of breath. Negative for cough, hemoptysis, sputum production, wheezing and stridor.   Cardiovascular:  Negative for chest pain, palpitations, orthopnea, leg swelling and PND.  Gastrointestinal:  Negative for abdominal pain, constipation, diarrhea, heartburn, nausea and vomiting.  Genitourinary:  Negative for dysuria, hematuria and urgency.  Musculoskeletal:  Negative for joint pain and myalgias.  Skin:  Negative for itching and rash.  Neurological:  Negative for dizziness, tingling, weakness and headaches.  Endo/Heme/Allergies:  Negative for environmental allergies. Does not bruise/bleed easily.  Psychiatric/Behavioral:  Negative for depression. The patient is not nervous/anxious and does not have insomnia.   All other systems reviewed and are negative.    Objective:  Physical Exam Vitals reviewed.  Constitutional:      General: He is not in acute distress.    Appearance: He is well-developed.     Comments: Thin elderly chronically ill-appearing  HENT:     Head: Normocephalic and atraumatic.  Eyes:     General: No scleral icterus.    Conjunctiva/sclera: Conjunctivae normal.     Pupils: Pupils are equal, round, and reactive to light.  Neck:     Vascular: No JVD.     Trachea: No tracheal deviation.  Cardiovascular:     Rate and Rhythm: Normal rate and regular rhythm.     Heart sounds: Normal heart sounds. No murmur heard. Pulmonary:     Effort: Pulmonary effort is normal. No tachypnea, accessory muscle usage or respiratory distress.     Breath sounds: No stridor. No wheezing, rhonchi or rales.  Abdominal:     General: There is no distension.     Palpations: Abdomen is soft.     Tenderness:  There is no abdominal tenderness.  Musculoskeletal:        General: No tenderness.      Cervical back: Neck supple.  Lymphadenopathy:     Cervical: No cervical adenopathy.  Skin:    General: Skin is warm and dry.     Capillary Refill: Capillary refill takes less than 2 seconds.     Findings: No rash.  Neurological:     Mental Status: He is alert and oriented to person, place, and time.  Psychiatric:        Behavior: Behavior normal.      Vitals:   12/15/21 1035  BP: 94/60  Pulse: 63  SpO2: 99%  Weight: 121 lb 9.6 oz (55.2 kg)  Height: '5\' 8"'$  (1.727 m)   99% on RA BMI Readings from Last 3 Encounters:  12/15/21 18.49 kg/m  12/09/21 18.25 kg/m  11/30/21 18.74 kg/m   Wt Readings from Last 3 Encounters:  12/15/21 121 lb 9.6 oz (55.2 kg)  12/09/21 120 lb (54.4 kg)  11/30/21 123 lb 3.8 oz (55.9 kg)     CBC    Component Value Date/Time   WBC 8.2 12/01/2021 0455   RBC 2.94 (L) 12/01/2021 0455   HGB 9.6 (L) 12/01/2021 0455   HGB 10.2 (L) 12/01/2020 1025   HGB 13.7 07/14/2017 1658   HCT 27.9 (L) 12/01/2021 0455   HCT 41.0 07/14/2017 1658   PLT 137 (L) 12/01/2021 0455   PLT 120 (L) 12/01/2020 1025   PLT 169 07/14/2017 1658   MCV 94.9 12/01/2021 0455   MCV 92 07/14/2017 1658   MCH 32.7 12/01/2021 0455   MCHC 34.4 12/01/2021 0455   RDW 13.2 12/01/2021 0455   RDW 14.2 07/14/2017 1658   LYMPHSABS 0.7 11/24/2021 0604   MONOABS 0.5 11/24/2021 0604   EOSABS 0.1 11/24/2021 0604   BASOSABS 0.0 11/24/2021 0604    Chest Imaging: CT chest 11/23/2021: Enlarging 7 mm right upper lobe pulmonary nodule concerning for malignancy in the setting of known rectal cancer The patient's images have been independently reviewed by me.    Pulmonary Functions Testing Results:     No data to display          FeNO:   Pathology: 11/30/2021 bronchoscopy pathology consistent with colonic metastasis  Echocardiogram:   Heart Catheterization:     Assessment & Plan:     ICD-10-CM   1. Carcinoma of colon metastatic to lung ([facility])  C18.9    C78.00     2. Nodule of  right lung  R91.1       Discussion:   This is a 76 year old gentleman, history of colon cancer.  Planned surgery by Dr. Marcello Moores at [facility].  Recently taken for navigational bronchoscopy tissue sampling of a right upper lobe pulmonary nodule consistent with adenocarcinoma with colonic primary for metastasis.  Plan: Here today seen for postop and hospital follow-up. Patient doing well. Reviewed pathology report. Encouraged importance of medical oncology and radiation oncology and surgery follow-up as already scheduled.  Reviewed these with the patient as well as his caregiver that was present with him today.  They helps keep up with his appointments. Patient does not need to follow-up with me at this time. He is on a call and let us know if anything changes or we can be of help in any way.   Current Outpatient Medications:    apixaban (ELIQUIS) 2.5 MG TABS tablet, Take 1 tablet (2.5 mg total) by mouth 2 (two) times daily., Disp: 60  tablet, Rfl: 1   Ascorbic Acid (VITAMIN C PO), Take 1 tablet by mouth daily., Disp: , Rfl:    b complex vitamins capsule, Take 1 capsule by mouth daily., Disp: , Rfl:    CALCIUM PO, Take 1 tablet by mouth daily., Disp: , Rfl:    Cholecalciferol (VITAMIN D-3 PO), Take 1 capsule by mouth daily., Disp: , Rfl:    diltiazem (CARDIZEM SR) 90 MG 12 hr capsule, Take 1 capsule (90 mg total) by mouth 2 (two) times daily., Disp: 60 capsule, Rfl: 0   dronedarone (MULTAQ) 400 MG tablet, Take 1 tablet (400 mg total) by mouth 2 (two) times daily with a meal., Disp: 60 tablet, Rfl: 0   MAGNESIUM PO, Take 1 tablet by mouth daily., Disp: , Rfl:    nitroGLYCERIN (NITROSTAT) 0.4 MG SL tablet, Place 0.4 mg under the tongue every 5 (five) minutes x 3 doses as needed for chest pain., Disp: , Rfl:    omeprazole (PRILOSEC) 40 MG capsule, TAKE 1 CAPSULE (40 MG TOTAL) BY MOUTH DAILY., Disp: 30 capsule, Rfl: 3   rosuvastatin (CRESTOR) 20 MG tablet, TAKE 1 TABLET BY MOUTH EVERY DAY (Patient  taking differently: Take 20 mg by mouth daily.), Disp: 90 tablet, Rfl: 3   tamsulosin (FLOMAX) 0.4 MG CAPS capsule, Take 1 capsule (0.4 mg total) by mouth daily after supper., Disp: 30 capsule, Rfl: 0   VITAMIN A PO, Take 1 tablet by mouth daily., Disp: , Rfl:    VITAMIN E PO, Take 1 capsule by mouth daily at 6 (six) AM., Disp: , Rfl:     Garner Nash, DO [facility] 12/15/2021 10:54 AM

## 2021-12-15 NOTE — Telephone Encounter (Signed)
Jared White per Dr. Grandville Silos "I need to see patient first. " They verbalized understanding.

## 2021-12-15 NOTE — Telephone Encounter (Signed)
Caller Name: [facility] Call back phone #: [phone]  Reason for Call: New patient for 12/20/2021, asked that you consider physical therapy evaluation coming from nursing home, would need approval for twice a week for 3 weeks and once a week for 2 weeks.

## 2021-12-15 NOTE — Progress Notes (Signed)
Sent message, via epic in basket, requesting order in epic from surgeon     12/15/21 0941  Preop Orders  Has preop orders? No  Name of staff/physician contacted for orders(Indicate phone or IB message) A. Marcello Moores, MD.

## 2021-12-15 NOTE — Telephone Encounter (Signed)
Caller Name: Jenny Reichmann from [facility] Call back phone #: [phone]  Reason for Call: She said the pt has just been released from nursing home and needs [facility]. She is not sure if you want to wait until the NP appt or would you be willing to sign off before that appt. Please call to let her know.

## 2021-12-15 NOTE — Patient Instructions (Signed)
Thank you for visiting Dr. Alpa Salvo at [facility]. Today we recommend the following:  Return if symptoms worsen or fail to improve.    Please do your part to reduce the spread of COVID-19.  

## 2021-12-17 ENCOUNTER — Ambulatory Visit: Payer: Self-pay | Admitting: General Surgery

## 2021-12-20 ENCOUNTER — Encounter: Payer: Self-pay | Admitting: Family Medicine

## 2021-12-20 ENCOUNTER — Ambulatory Visit (INDEPENDENT_AMBULATORY_CARE_PROVIDER_SITE_OTHER): Payer: Medicare Other | Admitting: Family Medicine

## 2021-12-20 VITALS — BP 116/80 | HR 74 | Temp 97.0°F | Wt 125.2 lb

## 2021-12-20 DIAGNOSIS — R42 Dizziness and giddiness: Secondary | ICD-10-CM

## 2021-12-20 DIAGNOSIS — E860 Dehydration: Secondary | ICD-10-CM | POA: Diagnosis not present

## 2021-12-20 DIAGNOSIS — K912 Postsurgical malabsorption, not elsewhere classified: Secondary | ICD-10-CM | POA: Diagnosis not present

## 2021-12-20 NOTE — Patient Instructions (Signed)
SURGICAL WAITING ROOM VISITATION Patients having surgery or a procedure may have no more than 2 support people in the waiting area - these visitors may rotate.   Children under the age of 41 must have an adult with them who is not the patient. If the patient needs to stay at the hospital during part of their recovery, the visitor guidelines for inpatient rooms apply. Pre-op nurse will coordinate an appropriate time for 1 support person to accompany patient in pre-op.  This support person may not rotate.    Please refer to the [facility] website for the visitor guidelines for Inpatients (after your surgery is over and you are in a regular room).    Your procedure is scheduled on: 12/31/21   Report to [facility] Main Entrance    Report to admitting at 5:15 AM   Call this number if you have problems the morning of surgery [phone]   Do not eat food :After Midnight.   After Midnight you may have the following liquids until 4:30 AM DAY OF SURGERY  Water Non-Citrus Juices (without pulp, NO RED) Carbonated Beverages Black Coffee (NO MILK/CREAM OR CREAMERS, sugar ok)  Clear Tea (NO MILK/CREAM OR CREAMERS, sugar ok) regular and decaf                             Plain Jell-O (NO RED)                                           Fruit ices (not with fruit pulp, NO RED)                                     Popsicles (NO RED)                                                               Sports drinks like Gatorade (NO RED)              Drink G2 drinks AT 10:00 PM the night before surgery.        The day of surgery:  Drink ONE (1) Pre-Surgery G2 at 4:30 AM the morning of surgery. Drink in one sitting. Do not sip.  This drink was given to you during your hospital  pre-op appointment visit. Nothing else to drink after completing the  Pre-Surgery G2.          If you have questions, please contact your surgeon's office.   FOLLOW BOWEL PREP AND ANY ADDITIONAL PRE OP INSTRUCTIONS YOU  RECEIVED FROM YOUR SURGEON'S OFFICE!!!     Oral Hygiene is also important to reduce your risk of infection.                                    Remember - BRUSH YOUR TEETH THE MORNING OF SURGERY WITH YOUR REGULAR TOOTHPASTE   Take these medicines the morning of surgery with A SIP OF WATER: Diltiazem, Omeprazole, Rosuvastatin  You may not have any metal on your body including jewelry, and body piercing             Do not wear lotions, powders, cologne, or deodorant              Men may shave face and neck.   Do not bring valuables to the hospital. [place].   Bring small overnight bag day of surgery.   DO NOT [place]. PHARMACY WILL DISPENSE MEDICATIONS LISTED ON YOUR MEDICATION LIST TO YOU DURING YOUR ADMISSION [facility]!               Please read over the following fact sheets you were given: IF YOU HAVE QUESTIONS ABOUT YOUR PRE-OP INSTRUCTIONS PLEASE CALL [facility] - Preparing for Surgery Before surgery, you can play an important role.  Because skin is not sterile, your skin needs to be as free of germs as possible.  You can reduce the number of germs on your skin by washing with CHG (chlorahexidine gluconate) soap before surgery.  CHG is an antiseptic cleaner which kills germs and bonds with the skin to continue killing germs even after washing. Please DO NOT use if you have an allergy to CHG or antibacterial soaps.  If your skin becomes reddened/irritated stop using the CHG and inform your nurse when you arrive at Short Stay. Do not shave (including legs and underarms) for at least 48 hours prior to the first CHG shower.  You may shave your face/neck.  Please follow these instructions carefully:  1.  Shower with CHG Soap the night before surgery and the  morning of surgery.  2.  If you choose to wash your hair, wash your hair first as  usual with your normal  shampoo.  3.  After you shampoo, rinse your hair and body thoroughly to remove the shampoo.                             4.  Use CHG as you would any other liquid soap.  You can apply chg directly to the skin and wash.  Gently with a scrungie or clean washcloth.  5.  Apply the CHG Soap to your body ONLY FROM THE NECK DOWN.   Do   not use on face/ open                           Wound or open sores. Avoid contact with eyes, ears mouth and   genitals (private parts).                       Wash face,  Genitals (private parts) with your normal soap.             6.  Wash thoroughly, paying special attention to the area where your    surgery  will be performed.  7.  Thoroughly rinse your body with warm water from the neck down.  8.  DO NOT shower/wash with your normal soap after using and rinsing off the CHG Soap.                9.  Pat yourself dry with a clean towel.  10.  Wear clean pajamas.            11.  Place clean sheets on your bed the night of your first shower and do not  sleep with pets. Day of Surgery : Do not apply any lotions/deodorants the morning of surgery.  Please wear clean clothes to the hospital/surgery center.  FAILURE TO FOLLOW THESE INSTRUCTIONS MAY RESULT IN THE CANCELLATION OF YOUR SURGERY  PATIENT SIGNATURE_________________________________  NURSE SIGNATURE__________________________________  ________________________________________________________________________   Adam Phenix  An incentive spirometer is a tool that can help keep your lungs clear and active. This tool measures how well you are filling your lungs with each breath. Taking long deep breaths may help reverse or decrease the chance of developing breathing (pulmonary) problems (especially infection) following: A long period of time when you are unable to move or be active. BEFORE THE PROCEDURE  If the spirometer includes an indicator to show your best effort, your  nurse or respiratory therapist will set it to a desired goal. If possible, sit up straight or lean slightly forward. Try not to slouch. Hold the incentive spirometer in an upright position. INSTRUCTIONS FOR USE  Sit on the edge of your bed if possible, or sit up as far as you can in bed or on a chair. Hold the incentive spirometer in an upright position. Breathe out normally. Place the mouthpiece in your mouth and seal your lips tightly around it. Breathe in slowly and as deeply as possible, raising the piston or the ball toward the top of the column. Hold your breath for 3-5 seconds or for as long as possible. Allow the piston or ball to fall to the bottom of the column. Remove the mouthpiece from your mouth and breathe out normally. Rest for a few seconds and repeat Steps 1 through 7 at least 10 times every 1-2 hours when you are awake. Take your time and take a few normal breaths between deep breaths. The spirometer may include an indicator to show your best effort. Use the indicator as a goal to work toward during each repetition. After each set of 10 deep breaths, practice coughing to be sure your lungs are clear. If you have an incision (the cut made at the time of surgery), support your incision when coughing by placing a pillow or rolled up towels firmly against it. Once you are able to get out of bed, walk around indoors and cough well. You may stop using the incentive spirometer when instructed by your caregiver.  RISKS AND COMPLICATIONS Take your time so you do not get dizzy or light-headed. If you are in pain, you may need to take or ask for pain medication before doing incentive spirometry. It is harder to take a deep breath if you are having pain. AFTER USE Rest and breathe slowly and easily. It can be helpful to keep track of a log of your progress. Your caregiver can provide you with a simple table to help with this. If you are using the spirometer at home, follow these  instructions: [place] IF:  You are having difficultly using the spirometer. You have trouble using the spirometer as often as instructed. Your pain medication is not giving enough relief while using the spirometer. You develop fever of 100.5 F (38.1 C) or higher. SEEK IMMEDIATE MEDICAL CARE IF:  You cough up bloody sputum that had not been present before. You develop fever of 102 F (38.9 C) or greater. You develop worsening pain at  or near the incision site. MAKE SURE YOU:  Understand these instructions. Will watch your condition. Will get help right away if you are not doing well or get worse. Document Released: 09/19/2006 Document Revised: 08/01/2011 Document Reviewed: 11/20/2006 [facility] Patient Information 2014 [place], [place].   ________________________________________________________________________

## 2021-12-20 NOTE — Progress Notes (Signed)
Assessment/Plan:  Spent 30 minutes with patient reviewing patient history and discussing treatment options Problem List Items Addressed This Visit       Other   Dizziness - Primary    patient without chest pain or shortness of breath or complete syncope Recently seen by cardiology, Overall he appears to be doing better  Likely multifactorial, patient does have low normal blood pressure, also has chronic malabsorption and recent diarrhea which pushed patient into hospitalization Given improvement with oral feeding, and able to work with PT, I feel that this will continue improved with time Given upcoming surgery, recommend abstaining from medication adjustment until after procedure Patient follow-up in 1 month Return and ED precautions discussed         Other Visit Diagnoses     Dehydration       Short gut syndrome              Subjective:  HPI:  Jared White is a 76 y.o. male who has Multiple bilateral rib fractures; Multiple thoracic/lumbar transverse process fractures; Fall from roof; CAD (coronary artery disease); GERD (gastroesophageal reflux disease); Essential hypertension; Hyperlipidemia; S/P CABG x 4; Psoriasis; Hypocalcemia; Facial numbness; Hypomagnesemia; Microalbuminuria; Stage 3b chronic kidney disease ([place]); Coronary artery disease involving native coronary artery of native heart without angina pectoris; Abnormal MRI, pelvis; Abnormal CT scan, pelvis; Rectal bleeding; Hemorrhoids; Constipation; Rectal cancer ([place]); Port-A-Cath in place; Weight loss; Gastric polyps; History of rectal cancer; History of colonic polyps; Barrett's esophagus without dysplasia; Preop cardiovascular exam; AKI (acute kidney injury) ([place]); Ileostomy in place [facility]); Pre-op exam; Acute renal failure superimposed on stage 3b chronic kidney disease ([place]); Hydroureteronephrosis; Lumbar back pain; Elevated troponin; Protein-calorie malnutrition, severe; PAF (paroxysmal atrial fibrillation) ([place]);  Lung nodule; and Dizziness on their problem list..   He  has a past medical history of Arthritis, Blood transfusion without reported diagnosis, Broken back, CAD (coronary artery disease), Cataract, CHF (congestive heart failure) ([place]), Diverticulitis, Dysrhythmia, Family history of heart disease, GERD (gastroesophageal reflux disease), Heart murmur, History of kidney stones, History of nuclear stress test (06/30/2011), Hyperlipidemia, Hypertension, IgA nephropathy, Irregular heart beat, Myocardial infarction ([place]), Pre-diabetes, Rectal cancer ([place]) (08/19/2019), and Systemic hypertension.Marland Kitchen   He presents with chief complaint of Establish Care (Light headed and weakness x months. ) .   Patient with complex medical history with colon cancer.  Status post multiple abdominal surgeries.  He does have metastatic nodule to the lung.  He has had colectomy and prior ileostomy.  He has an upcoming colonic surgery to put in a permanent ileostomy.  Regarding colon cancer, he follows with GI surgery and medical oncology.  Regarding lung metastasis, he follows with pulmonology as well.  Due to his prior colonic resection, he has had chronic diarrhea and malabsorption.  Recently hospitalized in 11/2021, found to have multiple electrolyte abnormalities.  Since his discharge, he reports he has had improvement in diarrhea.  Patient has gained approximately 5 pounds over the past 2 weeks.  Last BMP 2 weeks ago showed mild hypocalcemia at 8.5, mild hyponatremia at 130, decreased renal function with GFR around 45. Patient does work with home PT 3 times a week.  Reports that this is going well.   Has a history of coronary artery disease status post CABG.  Does follow with cardiology.  Recently seen earlier this month.  They did reduce his Eliquis based on weight and have modified it to discontinue right before surgery.  Then resume after.   Dizziness  He reports  new onset dizziness. He describes it as feeling light  headed, occurs intermittently, and typically lasts a few seconds.  It typically occurs when he is standing up from siting position. It is usually relieved by sitting still and rest. He has not started new medications around the time the dizziness started.   Associated symptoms: No hearing loss No tinnitus  No chest discomfort No heart palpitations  No heart racing No numbness or tingling of extremities  No nausea No vomiting  No speech difficulty No visual changes    Wt Readings from Last 3 Encounters:  12/20/21 125 lb 3.2 oz (56.8 kg)  12/15/21 121 lb 9.6 oz (55.2 kg)  12/09/21 120 lb (54.4 kg)    BP Readings from Last 3 Encounters:  12/20/21 116/80  12/15/21 94/60  12/09/21 133/62      Lab Results  Component Value Date   WBC 8.2 12/01/2021   HGB 9.6 (L) 12/01/2021   HCT 27.9 (L) 12/01/2021   MCV 94.9 12/01/2021   PLT 137 (L) 12/01/2021   Lab Results  Component Value Date   NA 136 12/01/2021   K 4.6 12/01/2021   CO2 20 (L) 12/01/2021   BUN 30 (H) 12/01/2021   CREATININE 1.54 (H) 12/01/2021   CALCIUM 8.5 (L) 12/01/2021   GLUCOSE 131 (H) 12/01/2021     ---------------------------------------------------------------------------------------------------  Past Surgical History:  Procedure Laterality Date   BRONCHIAL BIOPSY  11/30/2021   Procedure: BRONCHIAL BIOPSIES;  Surgeon: Garner Nash, DO;  Location: [facility];  Service: Pulmonary;;   BRONCHIAL NEEDLE ASPIRATION BIOPSY  11/30/2021   Procedure: BRONCHIAL NEEDLE ASPIRATION BIOPSIES;  Surgeon: Garner Nash, DO;  Location: [facility];  Service: Pulmonary;;   CARDIAC CATHETERIZATION  08/22/1995   PTCA of OM (Dr. Marella Chimes)   COLONOSCOPY     CORONARY ARTERY BYPASS GRAFT  01/22/2000   x5 - LIMA to LAD, SVG to diagonal, sequential SVG to ramus & OM, SVG to PDA (Dr. Tharon Aquas Trigt)   DIVERTING ILEOSTOMY N/A 06/10/2020   Procedure: DIVERTING LOOP ILEOSTOMY;  Surgeon: Leighton Ruff, MD;  Location: [facility];   Service: General;  Laterality: N/A;   FIDUCIAL MARKER PLACEMENT  11/30/2021   Procedure: FIDUCIAL MARKER PLACEMENT;  Surgeon: Garner Nash, DO;  Location: [facility];  Service: Pulmonary;;   ILEOSTOMY CLOSURE N/A 10/09/2020   Procedure: LOOP ILEOSTOMY REVERSAL;  Surgeon: Leighton Ruff, MD;  Location: [facility];  Service: General;  Laterality: N/A;   IR IMAGING GUIDED PORT INSERTION  09/04/2019   LUNG LOBECTOMY     left upper lobe   RECTAL BIOPSY N/A 07/09/2021   Procedure: BIOPSY RECTAL;  Surgeon: Leighton Ruff, MD;  Location: [facility];  Service: General;  Laterality: N/A;   RECTAL EXAM UNDER ANESTHESIA N/A 07/09/2021   Procedure: ANAL RECTAL EXAM UNDER ANESTHESIA, RIGID PROCTOSCOPY;  Surgeon: Leighton Ruff, MD;  Location: [facility];  Service: General;  Laterality: N/A;   TRANSTHORACIC ECHOCARDIOGRAM  12/24/2012   EF 55-60%, mild LVH, mild conc hypertrophy; mild MR; LA mildly dilated   XI ROBOTIC ASSISTED LOWER ANTERIOR RESECTION N/A 06/10/2020   Procedure: XI ROBOTIC ASSISTED LOWER ANTERIOR RESECTION, MOBILIZATION OF SPLENIC FLEXURE, RIGID PROCTOSCOPY;  Surgeon: Leighton Ruff, MD;  Location: [facility];  Service: General;  Laterality: N/A;    Outpatient Medications Prior to Visit  Medication Sig Dispense Refill   Calcium Carbonate (CALCIUM 500 PO) Take 1,000 mg by mouth in the morning and at bedtime.     diltiazem (CARDIZEM SR) 90  MG 12 hr capsule Take 1 capsule (90 mg total) by mouth 2 (two) times daily. (Patient taking differently: Take 90 mg by mouth daily.) 60 capsule 0   dronedarone (MULTAQ) 400 MG tablet Take 1 tablet (400 mg total) by mouth 2 (two) times daily with a meal. 60 tablet 0   MAGNESIUM PO Take 250 mg by mouth daily.     nitroGLYCERIN (NITROSTAT) 0.4 MG SL tablet Place 0.4 mg under the tongue every 5 (five) minutes x 3 doses as needed for chest pain.     omeprazole (PRILOSEC) 40 MG capsule TAKE 1 CAPSULE (40 MG TOTAL) BY MOUTH DAILY. (Patient taking differently: Take 40 mg by  mouth in the morning and at bedtime.) 30 capsule 3   rosuvastatin (CRESTOR) 20 MG tablet TAKE 1 TABLET BY MOUTH EVERY DAY 90 tablet 3   VITAMIN A PO Take by mouth.     VITAMIN D PO Take by mouth.     apixaban (ELIQUIS) 2.5 MG TABS tablet Take 1 tablet (2.5 mg total) by mouth 2 (two) times daily. (Patient not taking: Reported on 12/17/2021) 60 tablet 1   tamsulosin (FLOMAX) 0.4 MG CAPS capsule Take 1 capsule (0.4 mg total) by mouth daily after supper. (Patient not taking: Reported on 12/17/2021) 30 capsule 0   No facility-administered medications prior to visit.    Family History  Problem Relation Age of Onset   Heart attack Father    Kidney failure Brother    Prostate cancer Brother    Heart attack Brother    Heart disease Brother    Hypertension Brother    Heart disease Brother    Hypertension Brother    Colon cancer Neg Hx    Esophageal cancer Neg Hx    Rectal cancer Neg Hx    Stomach cancer Neg Hx    Inflammatory bowel disease Neg Hx    Liver disease Neg Hx    Pancreatic cancer Neg Hx    Colon polyps Neg Hx     Social History   Socioeconomic History   Marital status: Single    Spouse name: n/a   Number of children: 0   Years of education: Not on file   Highest education level: Not on file  Occupational History   Occupation: Chief Strategy Officer, [employer] representative  Tobacco Use   Smoking status: Never   Smokeless tobacco: Never  Vaping Use   Vaping Use: Never used  Substance and Sexual Activity   Alcohol use: Never   Drug use: No   Sexual activity: Not on file  Other Topics Concern   Not on file  Social History Narrative   Lives alone.   Sister-in-Law lives next door.   Social Determinants of Health   Financial Resource Strain: Low Risk  (01/22/2020)   Overall Financial Resource Strain (CARDIA)    Difficulty of Paying Living Expenses: Not hard at all  Food Insecurity: No Food Insecurity (01/22/2020)   Hunger Vital Sign    Worried About Running Out of Food in the Last  Year: Never true    Ran Out of Food in the Last Year: Never true  Transportation Needs: No Transportation Needs (01/22/2020)   PRAPARE - Hydrologist (Medical): No    Lack of Transportation (Non-Medical): No  Physical Activity: Not on file  Stress: No Stress Concern Present (01/22/2020)   [place]    Feeling of Stress : Only a little  Social  Connections: Socially Isolated (01/22/2020)   Social Connection and Isolation Panel [NHANES]    Frequency of Communication with Friends and Family: More than three times a week    Frequency of Social Gatherings with Friends and Family: More than three times a week    Attends Religious Services: Never    Marine scientist or Organizations: No    Attends Music therapist: Never    Marital Status: Never married  Human resources officer Violence: Not on file                                                                                                 Objective:  Physical Exam: BP 116/80 (BP Location: Left Arm, Patient Position: Sitting, Cuff Size: Large)   Pulse 74   Temp (!) 97 F (36.1 C) (Temporal)   Wt 125 lb 3.2 oz (56.8 kg)   SpO2 98%   BMI 19.04 kg/m    General: No acute distress. Awake and conversant.  Eyes: Normal conjunctiva, anicteric. Round symmetric pupils.  ENT: Hearing grossly intact. No nasal discharge.  Neck: Neck is supple. No masses or thyromegaly.  Respiratory: Respirations are non-labored. No auditory wheezing.  CTAB Skin: Warm. No rashes or ulcers.  Psych: Alert and oriented. Cooperative, Appropriate mood and affect, Normal judgment.  CV: No cyanosis or JVD, normal S1-S2, no murmurs rubs or gallops appreciated MSK:  No clubbing  Neuro: Sensation and CN II-XII grossly normal.        Alesia Banda, MD, MS

## 2021-12-20 NOTE — Progress Notes (Signed)
COVID Vaccine Completed: yes x3  Date of COVID positive in last 90 days:  PCP - Caprice Red, MD Cardiologist -   Chest x-ray - 11/30/21 Epic EKG - 12/09/21 Epic  Stress Test - 03/30/20 Epic ECHO - 11/22/21 Epic Cardiac Cath - 2001 Pacemaker/ICD device last checked: Spinal Cord Stimulator:  Bowel Prep -   Sleep Study -  CPAP -   Fasting Blood Sugar - pre? Checks Blood Sugar _____ times a day  Blood Thinner Instructions: Eliquis?? Aspirin Instructions: Last Dose:  Activity level:  Can go up a flight of stairs and perform activities of daily living without stopping and without symptoms of chest pain or shortness of breath.  Able to exercise without symptoms  Unable to go up a flight of stairs without symptoms of     Anesthesia review: CAD, HTN, a fib, CKD, CABG x4, CHF  Patient denies shortness of breath, fever, cough and chest pain at PAT appointment  Patient verbalized understanding of instructions that were given to them at the PAT appointment. Patient was also instructed that they will need to review over the PAT instructions again at home before surgery.

## 2021-12-20 NOTE — Assessment & Plan Note (Signed)
patient without chest pain or shortness of breath or complete syncope Recently seen by cardiology, Overall he appears to be doing better  Likely multifactorial, patient does have low normal blood pressure, also has chronic malabsorption and recent diarrhea which pushed patient into hospitalization Given improvement with oral feeding, and able to work with PT, I feel that this will continue improved with time Given upcoming surgery, recommend abstaining from medication adjustment until after procedure Patient follow-up in 1 month Return and ED precautions discussed

## 2021-12-21 ENCOUNTER — Other Ambulatory Visit (HOSPITAL_COMMUNITY): Payer: Self-pay

## 2021-12-22 ENCOUNTER — Encounter (HOSPITAL_COMMUNITY)
Admission: RE | Admit: 2021-12-22 | Discharge: 2021-12-22 | Disposition: A | Discharge status: Home / Self Care | Payer: Medicare Other | Source: Ambulatory Visit | Attending: General Surgery | Admitting: General Surgery

## 2021-12-22 ENCOUNTER — Ambulatory Visit
Admission: RE | Admit: 2021-12-22 | Discharge: 2021-12-22 | Disposition: A | Discharge status: Home / Self Care | Payer: Medicare Other | Source: Ambulatory Visit | Attending: Radiation Oncology | Admitting: Radiation Oncology

## 2021-12-22 ENCOUNTER — Other Ambulatory Visit: Payer: Self-pay

## 2021-12-22 ENCOUNTER — Encounter (HOSPITAL_COMMUNITY): Payer: Self-pay

## 2021-12-22 VITALS — BP 112/70 | HR 71 | Temp 98.1°F | Resp 14 | Ht 68.0 in | Wt 123.6 lb

## 2021-12-22 VITALS — BP 104/57 | HR 86 | Temp 97.8°F | Resp 20 | Ht 68.0 in | Wt 124.8 lb

## 2021-12-22 DIAGNOSIS — R011 Cardiac murmur, unspecified: Secondary | ICD-10-CM | POA: Insufficient documentation

## 2021-12-22 DIAGNOSIS — C7800 Secondary malignant neoplasm of unspecified lung: Secondary | ICD-10-CM

## 2021-12-22 DIAGNOSIS — Z79899 Other long term (current) drug therapy: Secondary | ICD-10-CM | POA: Diagnosis not present

## 2021-12-22 DIAGNOSIS — M129 Arthropathy, unspecified: Secondary | ICD-10-CM | POA: Diagnosis not present

## 2021-12-22 DIAGNOSIS — I252 Old myocardial infarction: Secondary | ICD-10-CM | POA: Diagnosis not present

## 2021-12-22 DIAGNOSIS — Z01818 Encounter for other preprocedural examination: Secondary | ICD-10-CM | POA: Insufficient documentation

## 2021-12-22 DIAGNOSIS — K219 Gastro-esophageal reflux disease without esophagitis: Secondary | ICD-10-CM | POA: Diagnosis not present

## 2021-12-22 DIAGNOSIS — Z87442 Personal history of urinary calculi: Secondary | ICD-10-CM | POA: Insufficient documentation

## 2021-12-22 DIAGNOSIS — I509 Heart failure, unspecified: Secondary | ICD-10-CM | POA: Insufficient documentation

## 2021-12-22 DIAGNOSIS — I10 Essential (primary) hypertension: Secondary | ICD-10-CM | POA: Diagnosis not present

## 2021-12-22 DIAGNOSIS — I2510 Atherosclerotic heart disease of native coronary artery without angina pectoris: Secondary | ICD-10-CM | POA: Insufficient documentation

## 2021-12-22 DIAGNOSIS — E785 Hyperlipidemia, unspecified: Secondary | ICD-10-CM | POA: Insufficient documentation

## 2021-12-22 DIAGNOSIS — C7801 Secondary malignant neoplasm of right lung: Secondary | ICD-10-CM | POA: Insufficient documentation

## 2021-12-22 DIAGNOSIS — C20 Malignant neoplasm of rectum: Secondary | ICD-10-CM | POA: Insufficient documentation

## 2021-12-22 DIAGNOSIS — R7303 Prediabetes: Secondary | ICD-10-CM | POA: Insufficient documentation

## 2021-12-22 DIAGNOSIS — D649 Anemia, unspecified: Secondary | ICD-10-CM | POA: Insufficient documentation

## 2021-12-22 HISTORY — DX: Anemia, unspecified: D64.9

## 2021-12-22 LAB — HEMOGLOBIN A1C
Hgb A1c MFr Bld: 5.4 % (ref 4.8–5.6)
Mean Plasma Glucose: 108.28 mg/dL

## 2021-12-22 LAB — GLUCOSE, CAPILLARY: Glucose-Capillary: 101 mg/dL — ABNORMAL HIGH (ref 70–99)

## 2021-12-22 LAB — CBC
HCT: 31.7 % — ABNORMAL LOW (ref 39.0–52.0)
Hemoglobin: 10.6 g/dL — ABNORMAL LOW (ref 13.0–17.0)
MCH: 32.6 pg (ref 26.0–34.0)
MCHC: 33.4 g/dL (ref 30.0–36.0)
MCV: 97.5 fL (ref 80.0–100.0)
Platelets: 151 10*3/uL (ref 150–400)
RBC: 3.25 MIL/uL — ABNORMAL LOW (ref 4.22–5.81)
RDW: 13.9 % (ref 11.5–15.5)
WBC: 6 10*3/uL (ref 4.0–10.5)
nRBC: 0 % (ref 0.0–0.2)

## 2021-12-22 NOTE — Progress Notes (Addendum)
GI Location of Tumor / Histology: Rectal Ca (recurrence)  11/21/2021 Dr. Ashok Cordia CLINICAL DATA:  Acute nonlocalized abdominal pain. History of colon cancer. Nausea. Weakness  FINDINGS: Lower chest: No acute findings. Normal heart size with coronary artery calcifications.   Hepatobiliary: No focal hepatic lesion. Gallstones within the gallbladder. No abnormal gallbladder distention or inflammation. No biliary dilatation.   Pancreas: Grossly negative, although suboptimally assessed due to motion, lack of enteric contrast and paucity of intra-abdominal fat.   Spleen: Normal in size without focal abnormality.   Adrenals/Urinary Tract: No adrenal nodule. Symmetric bilateral hydroureteronephrosis. Ureters are dilated to the bladder insertion.  No ureteral stone or external cause for obstruction. Moderately distended urinary bladder with bladder volume = 470 cm^3. No bladder wall thickening. Small bilateral renal cysts, needing no dedicated follow-up. Punctate bilateral intrarenal calculi. No bladder stones. No urethral stone visualized.   Stomach/Bowel: Diffuse and fairly prominent gastric wall thickening, a chronic finding. Small hiatal hernia with wall thickening of the distal esophagus. There is a duodenal diverticulum. No small bowel obstruction or abnormal distention. Enteric sutures noted in the right lower quadrant. Enteric sutures noted in the rectosigmoid colon. Minimal colonic stool. Colonic diverticulosis without focal diverticulitis. No acute colonic inflammation. Normal appendix tentatively visualized, right. Regardless, no appendicitis. Similar presacral soft tissue thickening, 18 mm.   Vascular/Lymphatic: Aortic and branch atherosclerosis. No aortic aneurysm. Moderate atherosclerosis of mesenteric vasculature, branches appear patent. Patent portal vein. No abdominopelvic adenopathy.   Reproductive: Mildly enlarged prostate.   Other: Stable presacral soft tissue density. No ascites or  free air.   Musculoskeletal: Chronic L2 compression deformity, unchanged. No acute osseous abnormalities or focal bone lesion.   IMPRESSION: 1. Symmetric bilateral hydroureteronephrosis, ureters both dilated to the bladder insertion. No ureteral stone or external cause for obstruction. Moderately distended urinary bladder with bladder volume = 470 cm. Recommend correlation with urinalysis to exclude urinary tract infection. 2. Punctate bilateral intrarenal calculi. 3. Chronic diffuse gastric wall thickening, unchanged from prior exams. Small hiatal hernia with wall thickening of the distal esophagus, can be seen with reflux or esophagitis. 4. Colonic diverticulosis without focal diverticulitis. 5. Cholelithiasis without gallbladder inflammation. 6. Additional stable chronic findings as described.  08/02/2021 Dr. Marcello Moores MR Pelvis without Contrast CLINICAL DATA:  History of anorectal neoplasm post resection and anastomosis. Found to have a recurrence in the area of the anal canal by report based on recent colonoscopy.  FINDINGS: TUMOR LOCATION  Discrete tumor is not visible though at the upper margin of the sphincter complex there is eccentric thickening of the mucosa of the lower rectum that extends inferiorly likely into the intersphincteric plane. There is also loss of normal fat planes just above the pelvic floor (image 4/9). And anastomotic site is noted approximately 6 cm to 7 cm above the anal orifice. This is above the area of concern at the pelvic floor. Signs of diverticular disease are noted above the anastomotic site. There may be some mild narrowing at the level of the anastomotic site as well.   TUMOR DESCRIPTION  Eccentric nodular appearing area as discussed above along the RIGHT anterolateral margin when viewed from below of the lower rectum and sphincter complex.   T - CATEGORY   T staging not well assessed.   N - CATEGORY   No discrete evidence of nodal disease in the  pelvis.   Urinary Tract: Urinary bladder without signs of perivesical stranding. No distal ureteral dilation.   Bowel: Low rectal to mid rectal anastomosis with irregularity  of the upper aspect of the anal canal potentially the site of endoscopic abnormality. No discrete masslike area is visible. Remaining bowel loops without acute process to the extent evaluated on pelvic MRI focused on the rectum.   Vascular/Lymphatic: No adenopathy in the pelvis. Vascular structures not well assessed due to lack of intravenous contrast.   Reproductive: Heterogeneous prostate, nonspecific finding.   Other: No ascites. Thickening of fascial planes in the pelvis following previous resection and anastomosis.   Musculoskeletal: Fatty replacement of marrow about the pelvis. No focal, suspicious bony abnormality on limited assessment.   IMPRESSION: Post treatment changes and anastomotic site with potential narrowing at the level of the anastomosis and irregular more nodular appearing areas that track towards the upper margin of the sphincter complex.  Study is confounded by presence of post treatment and postsurgical change. No discrete mass lesion is identified.  No signs of adenopathy.   06/11/2021 Dr. Alen Blew CT Abdomen Pelvis without Contrast CLINICAL DATA:  Restaging colorectal cancer. Initial diagnosis April 2021. History of surgery, chemotherapy and radiation therapy.   FINDINGS: CT CHEST FINDINGS   Cardiovascular: The heart is normal in size. No pericardial effusion. Stable mild tortuosity and atherosclerotic calcification involving the thoracic aorta. Stable extensive three-vessel coronary artery calcifications and surgical changes from prior bypass surgery. The right-sided power port is in good position without complicating features.   Mediastinum/Nodes: No mediastinal or hilar mass or lymphadenopathy.  The esophagus is grossly normal. There is a small stable hiatal hernia. The thyroid gland is  unremarkable.   Lungs/Pleura: Stable biapical pleural and parenchymal scarring changes. No worrisome pulmonary lesions. No pulmonary nodules to suggest pulmonary metastatic disease. No acute pulmonary findings. Stable lingular and right middle lobe scarring changes with a few scattered calcifications.   Musculoskeletal: No chest wall mass, supraclavicular or axillary adenopathy. The bony structures are unremarkable.   CT ABDOMEN PELVIS FINDINGS   Hepatobiliary: No hepatic lesions are identified without contrast.  No intrahepatic biliary dilatation. Stable cholelithiasis. No common bile duct dilatation.   Pancreas: No mass, inflammation or ductal dilatation.   Spleen: Normal size. No focal lesions.   Adrenals/Urinary Tract: Adrenal glands are unremarkable. Small renal calculi and bilateral renal cysts. No worrisome renal lesions without contrast. No hydronephrosis. The bladder is unremarkable.   Stomach/Bowel: The stomach demonstrates fairly significant and diffuse wall thickening but this is a stable finding. No discrete mass. The duodenum, small bowel and colon are unremarkable. No acute inflammatory changes, mass lesions or obstructive findings. Stable surgical changes with soft tissue thickening in the presacral space.  No findings suspicious for recurrent tumor or new colonic mass.   Vascular/Lymphatic: Stable atherosclerotic calcifications involving the aorta and branch vessels but no aneurysm. No mesenteric or retroperitoneal mass or adenopathy.   Reproductive: Stable enlarged prostate gland with median lobe hypertrophy impressing on the base of the bladder. The seminal vesicles are grossly normal.   Other: No free abdominal/free pelvic fluid collections. No pelvic mass or adenopathy. No inguinal adenopathy.   Musculoskeletal: No significant bony findings. No findings suspicious for metastatic disease. Remote L2 compression fracture and stable degenerative changes and osteoporosis  involving the spine.   IMPRESSION: 1. Stable postoperative changes with presacral soft tissue density.  No findings suspicious for recurrent tumor, locoregional adenopathy or metastatic disease elsewhere. 2. Stable diffuse gastric wall thickening. 3. Cholelithiasis. 4. Stable bilateral renal calculi and renal cysts. 5. Stable enlarged prostate gland with median lobe hypertrophy impressing on the base of the bladder. 6. Stable advanced  atherosclerotic calcifications involving the thoracic and abdominal aorta and branch vessels including the coronary arteries. Aortic Atherosclerosis (ICD10-I70.0).   Past/Anticipated interventions by surgeon, if any: NA  Past/Anticipated interventions by medical oncology, if any: NA  Weight changes, if any: Yes 60 lbs over six months.  Bowel/Bladder complaints, if any: No   Nausea / Vomiting, if any: No  Pain issues, if any:  0/10  Any blood per rectum:   No  SAFETY ISSUES: Prior radiation?  Yes, 2021 Pacemaker/ICD? No Possible current pregnancy? Male Is the patient on methotrexate? No  Current Complaints/Details:

## 2021-12-22 NOTE — Progress Notes (Signed)
Radiation Oncology         [phone] ________________________________  Initial outpatient Consultation  Name: Zacharia Sowles Campanelli MRN: [id number]  Date of Service: 12/22/2021 DOB: [birth year]  CC:Bonnita Hollow, MD  Garner Nash, DO   REFERRING PHYSICIAN: Garner Nash, DO  DIAGNOSIS: 76 year old male with a solitary, 7 mm RUL lung metastasis secondary to his known adenocarcinoma of the rectum.    ICD-10-CM   1. Rectal adenocarcinoma metastatic to lung ([place])  C20    C78.00       HISTORY OF PRESENT ILLNESS: Jonaven Hilgers Bromwell is a 76 y.o. male seen at the request of Dr. Valeta Harms.  He was initially diagnosed with T3 N2 M0 rectal cancer in 2021 and completed 7 cycles of neoadjuvant chemo under the care of Dr. Alen Blew followed by pelvic radiotherapy concurrent with oral Xeloda, under the care of Dr. Lisbeth Renshaw, completed in November 2021.  This was followed by a robotic low anterior resection on June 10, 2020, under the care of of Dr. Leighton Ruff.  He has been followed in active surveillance since that time and was able to have a loop ileostomy reversal in May 2022.  He was noted to have an anastomotic stenosis on follow-up imaging so he was taken back to the operating room with Dr. Marcello Moores on 07/09/2021 and surgical path from that procedure confirmed moderately differentiated adenocarcinoma, consistent with a recurrence at the anastomosis.  He is planning to undergo robotic assisted APR with permanent colostomy on 12/31/2021, under the care of Dr. Leighton Ruff.    On a recent CT chest angio performed on 11/23/2021 during hospital admission for work-up of upper abdominal/chest pain he was noted to have enlargement of a right upper lobe pulmonary nodule measuring 6 to 7 mm as compared to 5 mm in January 2023 and punctate prior to that.       This was further evaluated with robotic assisted bronchoscopy with biopsies of the RUL lung lesion under the care of Dr. Valeta Harms on 11/30/2021.  Final pathology  confirmed metastatic adenocarcinoma consistent with colon primary.  Fortunately, there is no evidence of any additional metastatic disease on his recent CT C/A/P so he has been referred to [place] today, to discuss radiation treatment options for management of the oligometastatic lung lesion.  Dr. Alen Blew is planning to defer any further systemic therapy unless there is evidence of disease progression on future restaging scans.  At present, the patient is planning to move forward with the robotic assisted APR with permanent colostomy as scheduled on 12/31/2021.  PREVIOUS RADIATION THERAPY: Yes  02/17/20 - 03/25/20: The rectum and regional nodes of the pelvis were treated to 45 Gy in 25 fractions, followed by a boost to the rectum only, with 5.4 Gy in 3 fractions of 1.8 Gy each   PAST MEDICAL HISTORY:  Past Medical History:  Diagnosis Date   Anemia    Arthritis    Blood transfusion without reported diagnosis    Broken back    CAD (coronary artery disease)    PCI & CABG   Cataract    "LIGHT"-RIGHT   CHF (congestive heart failure) ([place])    Diverticulitis    mild - approx 2004   Dysrhythmia    occasional arrythmias   Family history of heart disease    GERD (gastroesophageal reflux disease)    Heart murmur    HX OF YEARS AGO    History of kidney stones    History of nuclear stress test  06/30/2011   lexiscan; normal pattern of perfusion post-stress; low risk scan    Hyperlipidemia    Hypertension    IgA nephropathy    Irregular heart beat    Myocardial infarction ([place])    Pre-diabetes    Rectal cancer ([place]) 08/19/2019   Systemic hypertension       PAST SURGICAL HISTORY: Past Surgical History:  Procedure Laterality Date   BRONCHIAL BIOPSY  11/30/2021   Procedure: BRONCHIAL BIOPSIES;  Surgeon: Garner Nash, DO;  Location: [facility];  Service: Pulmonary;;   BRONCHIAL NEEDLE ASPIRATION BIOPSY  11/30/2021   Procedure: BRONCHIAL NEEDLE ASPIRATION BIOPSIES;  Surgeon: Garner Nash, DO;   Location: [facility];  Service: Pulmonary;;   CARDIAC CATHETERIZATION  08/22/1995   PTCA of OM (Dr. Marella Chimes)   COLONOSCOPY     CORONARY ARTERY BYPASS GRAFT  01/22/2000   x5 - LIMA to LAD, SVG to diagonal, sequential SVG to ramus & OM, SVG to PDA (Dr. Tharon Aquas Trigt)   DIVERTING ILEOSTOMY N/A 06/10/2020   Procedure: DIVERTING LOOP ILEOSTOMY;  Surgeon: Leighton Ruff, MD;  Location: [facility];  Service: General;  Laterality: N/A;   FIDUCIAL MARKER PLACEMENT  11/30/2021   Procedure: FIDUCIAL MARKER PLACEMENT;  Surgeon: Garner Nash, DO;  Location: [facility];  Service: Pulmonary;;   ILEOSTOMY CLOSURE N/A 10/09/2020   Procedure: LOOP ILEOSTOMY REVERSAL;  Surgeon: Leighton Ruff, MD;  Location: [facility];  Service: General;  Laterality: N/A;   IR IMAGING GUIDED PORT INSERTION  09/04/2019   LUNG LOBECTOMY     left upper lobe   RECTAL BIOPSY N/A 07/09/2021   Procedure: BIOPSY RECTAL;  Surgeon: Leighton Ruff, MD;  Location: [facility];  Service: General;  Laterality: N/A;   RECTAL EXAM UNDER ANESTHESIA N/A 07/09/2021   Procedure: ANAL RECTAL EXAM UNDER ANESTHESIA, RIGID PROCTOSCOPY;  Surgeon: Leighton Ruff, MD;  Location: [facility];  Service: General;  Laterality: N/A;   TRANSTHORACIC ECHOCARDIOGRAM  12/24/2012   EF 55-60%, mild LVH, mild conc hypertrophy; mild MR; LA mildly dilated   XI ROBOTIC ASSISTED LOWER ANTERIOR RESECTION N/A 06/10/2020   Procedure: XI ROBOTIC ASSISTED LOWER ANTERIOR RESECTION, MOBILIZATION OF SPLENIC FLEXURE, RIGID PROCTOSCOPY;  Surgeon: Leighton Ruff, MD;  Location: [facility];  Service: General;  Laterality: N/A;    FAMILY HISTORY:  Family History  Problem Relation Age of Onset   Heart attack Father    Kidney failure Brother    Prostate cancer Brother    Heart attack Brother    Heart disease Brother    Hypertension Brother    Heart disease Brother    Hypertension Brother    Colon cancer Neg Hx    Esophageal cancer Neg Hx    Rectal cancer Neg Hx    Stomach cancer  Neg Hx    Inflammatory bowel disease Neg Hx    Liver disease Neg Hx    Pancreatic cancer Neg Hx    Colon polyps Neg Hx     SOCIAL HISTORY:  Social History   Socioeconomic History   Marital status: Single    Spouse name: n/a   Number of children: 0   Years of education: Not on file   Highest education level: Not on file  Occupational History   Occupation: Chief Strategy Officer, [employer] representative  Tobacco Use   Smoking status: Never   Smokeless tobacco: Never  Vaping Use   Vaping Use: Never used  Substance and Sexual Activity   Alcohol use: Never   Drug use: No  Sexual activity: Not on file  Other Topics Concern   Not on file  Social History Narrative   Lives alone.   Sister-in-Law lives next door.   Social Determinants of Health   Financial Resource Strain: Low Risk  (01/22/2020)   Overall Financial Resource Strain (CARDIA)    Difficulty of Paying Living Expenses: Not hard at all  Food Insecurity: No Food Insecurity (01/22/2020)   Hunger Vital Sign    Worried About Running Out of Food in the Last Year: Never true    Ran Out of Food in the Last Year: Never true  Transportation Needs: No Transportation Needs (01/22/2020)   PRAPARE - Hydrologist (Medical): No    Lack of Transportation (Non-Medical): No  Physical Activity: Not on file  Stress: No Stress Concern Present (01/22/2020)   [place]    Feeling of Stress : Only a little  Social Connections: Socially Isolated (01/22/2020)   Social Connection and Isolation Panel [NHANES]    Frequency of Communication with Friends and Family: More than three times a week    Frequency of Social Gatherings with Friends and Family: More than three times a week    Attends Religious Services: Never    Marine scientist or Organizations: No    Attends Archivist Meetings: Never    Marital Status: Never married  Human resources officer Violence:  Not on file    ALLERGIES: Patient has no known allergies.  MEDICATIONS:  Current Outpatient Medications  Medication Sig Dispense Refill   apixaban (ELIQUIS) 2.5 MG TABS tablet Take 1 tablet (2.5 mg total) by mouth 2 (two) times daily. (Patient not taking: Reported on 12/17/2021) 60 tablet 1   Calcium Carbonate (CALCIUM 500 PO) Take 1,000 mg by mouth in the morning and at bedtime.     diltiazem (CARDIZEM SR) 90 MG 12 hr capsule Take 1 capsule (90 mg total) by mouth 2 (two) times daily. (Patient taking differently: Take 90 mg by mouth daily.) 60 capsule 0   dronedarone (MULTAQ) 400 MG tablet Take 1 tablet (400 mg total) by mouth 2 (two) times daily with a meal. 60 tablet 0   MAGNESIUM PO Take 250 mg by mouth daily.     nitroGLYCERIN (NITROSTAT) 0.4 MG SL tablet Place 0.4 mg under the tongue every 5 (five) minutes x 3 doses as needed for chest pain.     omeprazole (PRILOSEC) 40 MG capsule TAKE 1 CAPSULE (40 MG TOTAL) BY MOUTH DAILY. (Patient taking differently: Take 40 mg by mouth in the morning and at bedtime.) 30 capsule 3   rosuvastatin (CRESTOR) 20 MG tablet TAKE 1 TABLET BY MOUTH EVERY DAY 90 tablet 3   VITAMIN A PO Take by mouth.     VITAMIN D PO Take by mouth.     No current facility-administered medications for this visit.    REVIEW OF SYSTEMS:  On review of systems, the patient reports that he is doing well overall. he denies any chest pain, shortness of breath, cough, fevers, chills, night sweats, unintended weight changes. he denies any bowel or bladder disturbances, and denies abdominal pain, nausea or vomiting. he denies any new musculoskeletal or joint aches or pains. A complete review of systems is obtained and is otherwise negative.    PHYSICAL EXAM:  Wt Readings from Last 3 Encounters:  12/22/21 123 lb 9.6 oz (56.1 kg)  12/20/21 125 lb 3.2 oz (56.8 kg)  12/15/21  121 lb 9.6 oz (55.2 kg)   Temp Readings from Last 3 Encounters:  12/22/21 98.1 F (36.7 C) (Oral)  12/20/21  (!) 97 F (36.1 C) (Temporal)  12/09/21 98 F (36.7 C)   BP Readings from Last 3 Encounters:  12/22/21 112/70  12/20/21 116/80  12/15/21 94/60   Pulse Readings from Last 3 Encounters:  12/22/21 71  12/20/21 74  12/15/21 63    /10  In general this is a well appearing Caucasian male in no acute distress.  He's alert and oriented x4 and appropriate throughout the examination. Cardiopulmonary assessment is negative for acute distress and he exhibits normal effort.   KPS = 100  100 - Normal; no complaints; no evidence of disease. 90   - Able to carry on normal activity; minor signs or symptoms of disease. 80   - Normal activity with effort; some signs or symptoms of disease. 63   - Cares for self; unable to carry on normal activity or to do active work. 60   - Requires occasional assistance, but is able to care for most of his personal needs. 50   - Requires considerable assistance and frequent medical care. 83   - Disabled; requires special care and assistance. 69   - Severely disabled; hospital admission is indicated although death not imminent. 31   - Very sick; hospital admission necessary; active supportive treatment necessary. 10   - Moribund; fatal processes progressing rapidly. 0     - Dead  Karnofsky DA, Abelmann [place], Craver LS and Burchenal [facility] [phone]) The use of the nitrogen mustards in the palliative treatment of carcinoma: with particular reference to bronchogenic carcinoma Cancer 1 634-56  LABORATORY DATA:  Lab Results  Component Value Date   WBC 6.0 12/22/2021   HGB 10.6 (L) 12/22/2021   HCT 31.7 (L) 12/22/2021   MCV 97.5 12/22/2021   PLT 151 12/22/2021   Lab Results  Component Value Date   NA 136 12/01/2021   K 4.6 12/01/2021   CL 107 12/01/2021   CO2 20 (L) 12/01/2021   Lab Results  Component Value Date   ALT 20 11/25/2021   AST 25 11/25/2021   ALKPHOS 62 11/25/2021   BILITOT 0.8 11/25/2021     RADIOGRAPHY: DG Chest Port 1 View  Result Date:  11/30/2021 CLINICAL DATA:  Postop bronch. EXAM: PORTABLE CHEST 1 VIEW COMPARISON:  Chest x-ray 11/21/2021. FINDINGS: No consolidation. Known right upper lobe pulmonary nodule better characterized on recent CT chest. No visible pneumothorax. Cardiomediastinal silhouette is similar prior. Median sternotomy and CABG. Similar position of right IJ Port-A-Cath with tip at the superior cavoatrial junction. IMPRESSION: No visible pneumothorax. Electronically Signed   By: Margaretha Sheffield M.D.   On: 11/30/2021 11:41   DG C-ARM BRONCHOSCOPY  Result Date: 11/30/2021 C-ARM BRONCHOSCOPY: Fluoroscopy was utilized by the requesting physician.  No radiographic interpretation.   CT Angio Chest Pulmonary Embolism (PE) W or WO Contrast  Result Date: 11/23/2021 CLINICAL DATA:  76 year old male with abnormal D-dimer. Recent abdominal pain. History of colon cancer. Back pain. EXAM: CT ANGIOGRAPHY CHEST WITH CONTRAST TECHNIQUE: Multidetector CT imaging of the chest was performed using the standard protocol during bolus administration of intravenous contrast. Multiplanar CT image reconstructions and MIPs were obtained to evaluate the vascular anatomy. RADIATION DOSE REDUCTION: This exam was performed according to the departmental dose-optimization program which includes automated exposure control, adjustment of the mA and/or kV according to patient size and/or use of iterative reconstruction technique. CONTRAST:  86m  OMNIPAQUE IOHEXOL 350 MG/ML SOLN COMPARISON:  CT Abdomen and Pelvis 2 days ago. Restaging chest CT 06/11/2021. FINDINGS: Cardiovascular: Good contrast bolus timing in the pulmonary arterial tree. No focal filling defect identified in the pulmonary arteries to suggest acute pulmonary embolism. Prior CABG. Calcified aortic atherosclerosis. Stable cardiac size, within normal limits. No pericardial effusion. Right chest IJ approach Port-A-Cath is stable. Mediastinum/Nodes: No mediastinal mass or lymphadenopathy  identified. Lungs/Pleura: Major airways are patent. Mildly lower lung volumes compared to 06-08-22. Overall stable ventilation and left lung base scarring. No significant pleural effusion. However, a 6-7 mm right upper lobe pulmonary nodule on series 7, image 29 appears slightly larger since 06-08-2022 (approximally 5 mm at that time). And this was only punctate on a previous CT 10/05/2020. No other pulmonary nodule or suspicious pulmonary opacity. Upper Abdomen: Pronounced gastric wall thickening seems to be a chronic finding, very similar to the chest CT appearance in 2022-06-08, more pronounced from yesterday although the stomach is also more decompressed now. Otherwise stable visible upper abdominal viscera including cholelithiasis. No free air or free fluid in the visible upper abdomen. Musculoskeletal: Prior sternotomy. Osteopenia and thoracic spine degeneration. Chronic T3 and L2 compression fractures appear stable from last year. Chronic right lateral rib fractures are stable. No acute osseous abnormality identified. Review of the MIP images confirms the above findings. IMPRESSION: 1. Negative for acute pulmonary embolus. 2. Slowly enlarging solid right upper lobe pulmonary nodule since last year, now 6-7 mm. This is more suspicious for Bronchogenic Carcinoma than solitary lung metastasis. Recommend referral to [facility] 3. No other acute finding in the chest.  Prior CABG. 4. Stable visible upper abdomen. Electronically Signed   By: Genevie Ann M.D.   On: 11/23/2021 06:53   ECHOCARDIOGRAM COMPLETE  Result Date: 11/22/2021    ECHOCARDIOGRAM REPORT   Patient Name:   TEANCUM BRULE Zaldivar Date of Exam: 11/22/2021 Medical Rec #:  [id number]    Height:       68.0 in Accession #:    [id number]   Weight:       123.2 lb Date of Birth:  [birth year]    BSA:          1.664 m Patient Age:    20 years     BP:           140/66 mmHg Patient Gender: M            HR:           84 bpm. Exam Location:   Inpatient Procedure: 2D Echo, Color Doppler and Cardiac Doppler Indications:    Elevated trop  History:        Patient has prior history of Echocardiogram examinations, most                 recent 03/25/2020. CAD, Prior CABG; Risk Factors:Hypertension and                 HLD.  Sonographer:    Joette Catching RCS Referring Phys: [id number] [place]  1. Left ventricular ejection fraction, by estimation, is 60 to 65%. The left ventricle has normal function. The left ventricle has no regional wall motion abnormalities. There is mild left ventricular hypertrophy. Left ventricular diastolic parameters are consistent with Grade I diastolic dysfunction (impaired relaxation).  2. Right ventricular systolic function is normal. The right ventricular size is normal.  3. Left atrial size was moderately dilated.  4. The mitral valve is normal in  structure. Mild mitral valve regurgitation. No evidence of mitral stenosis.  5. The aortic valve is tricuspid. Aortic valve regurgitation is not visualized. Aortic valve sclerosis is present, with no evidence of aortic valve stenosis.  6. Aortic dilatation noted. There is borderline dilatation of the ascending aorta, measuring 39 mm.  7. The inferior vena cava is normal in size with greater than 50% respiratory variability, suggesting right atrial pressure of 3 mmHg. Comparison(s): No prior Echocardiogram. FINDINGS  Left Ventricle: Left ventricular ejection fraction, by estimation, is 60 to 65%. The left ventricle has normal function. The left ventricle has no regional wall motion abnormalities. The left ventricular internal cavity size was normal in size. There is  mild left ventricular hypertrophy. Left ventricular diastolic parameters are consistent with Grade I diastolic dysfunction (impaired relaxation). Right Ventricle: The right ventricular size is normal. Right ventricular systolic function is normal. Left Atrium: Left atrial size was moderately dilated. Right Atrium:  Right atrial size was normal in size. Pericardium: There is no evidence of pericardial effusion. Mitral Valve: The mitral valve is normal in structure. Mild mitral valve regurgitation. No evidence of mitral valve stenosis. Tricuspid Valve: The tricuspid valve is normal in structure. Tricuspid valve regurgitation is mild . No evidence of tricuspid stenosis. Aortic Valve: The aortic valve is tricuspid. Aortic valve regurgitation is not visualized. Aortic valve sclerosis is present, with no evidence of aortic valve stenosis. Aortic valve mean gradient measures 5.0 mmHg. Aortic valve peak gradient measures 6.8  mmHg. Aortic valve area, by VTI measures 3.24 cm. Pulmonic Valve: The pulmonic valve was normal in structure. Pulmonic valve regurgitation is not visualized. No evidence of pulmonic stenosis. Aorta: The aortic root is normal in size and structure and aortic dilatation noted. There is borderline dilatation of the ascending aorta, measuring 39 mm. Venous: The inferior vena cava is normal in size with greater than 50% respiratory variability, suggesting right atrial pressure of 3 mmHg. IAS/Shunts: No atrial level shunt detected by color flow Doppler.  LEFT VENTRICLE PLAX 2D LVIDd:         4.20 cm   Diastology LVIDs:         3.10 cm   LV e' medial:    7.07 cm/s LV PW:         1.20 cm   LV E/e' medial:  13.4 LV IVS:        1.10 cm   LV e' lateral:   9.25 cm/s LVOT diam:     2.10 cm   LV E/e' lateral: 10.2 LV SV:         70 LV SV Index:   42 LVOT Area:     3.46 cm  RIGHT VENTRICLE             IVC RV Basal diam:  3.20 cm     IVC diam: 1.50 cm RV Mid diam:    1.90 cm RV S prime:     23.60 cm/s TAPSE (M-mode): 1.5 cm LEFT ATRIUM             Index        RIGHT ATRIUM           Index LA diam:        4.00 cm 2.40 cm/m   RA Area:     14.90 cm LA Vol (A2C):   68.0 ml 40.87 ml/m  RA Volume:   35.40 ml  21.28 ml/m LA Vol (A4C):   65.6 ml 39.43 ml/m LA Biplane Vol: 71.2  ml 42.80 ml/m  AORTIC VALVE AV Area (Vmax):    2.74  cm AV Area (Vmean):   2.34 cm AV Area (VTI):     3.24 cm AV Vmax:           130.00 cm/s AV Vmean:          106.000 cm/s AV VTI:            0.215 m AV Peak Grad:      6.8 mmHg AV Mean Grad:      5.0 mmHg LVOT Vmax:         103.00 cm/s LVOT Vmean:        71.700 cm/s LVOT VTI:          0.201 m LVOT/AV VTI ratio: 0.93  AORTA Ao Root diam: 3.40 cm Ao Asc diam:  3.90 cm MITRAL VALVE               TRICUSPID VALVE MV Area (PHT): 4.21 cm    TR Peak grad:   38.7 mmHg MV Decel Time: 180 msec    TR Vmax:        311.00 cm/s MR Peak grad: 124.5 mmHg MR Mean grad: 94.0 mmHg    SHUNTS MR Vmax:      558.00 cm/s  Systemic VTI:  0.20 m MR Vmean:     473.0 cm/s   Systemic Diam: 2.10 cm MV E velocity: 94.70 cm/s MV A velocity: 78.40 cm/s MV E/A ratio:  1.21 Kirk Ruths MD Electronically signed by Kirk Ruths MD Signature Date/Time: 11/22/2021/2:49:08 PM    Final       IMPRESSION/PLAN: 1. 76 y.o. male with a solitary, 7 mm RUL lung metastasis secondary to his known adenocarcinoma of the rectum. Today, we talked to the patient and family about the findings and workup thus far. We discussed the natural history of oligometastatic adenocarcinoma of the rectum and general treatment, highlighting the role of radiotherapy in the management. We discussed the available radiation techniques, and focused on the details and logistics of delivery.  The recommendation is for a 3 fraction course of stereotactic body radiotherapy (SBRT) to the solitary RUL lung nodule.  We reviewed the anticipated acute and late sequelae associated with radiation in this setting. The patient was encouraged to ask questions that were answered to his stated satisfaction.  At the conclusion of our conversation, the patient would like to proceed with SBRT following recovery from the planned surgery on 12/31/21.   We personally spent 60 minutes in this encounter including chart review, reviewing radiological studies, meeting face-to-face with the patient,  entering orders and completing documentation.    Nicholos Johns, PA-C    Tyler Pita, MD  [facility] Direct Dial: [phone]  Fax: [phone] .com  Skype  [email or web address]

## 2021-12-22 NOTE — Consult Note (Signed)
[facility] Nurse requested for preoperative stoma site marking Patient has had previous ileostomy    Discussed surgical procedure and stoma creation with patient. Explained role of the [facility] nurse team.  Provided the patient with educational booklet and provided samples of pouching options.    Examined patient sitting, and standing in order to place the marking in the patient's visual field, away from any creases or abdominal contour issues and within the rectus muscle.  Patient to have permanent ostomy   This WOC had marked this patient for a colostomy 11/01/21; the marking for this colostomy in LLQ  __3_ cm to the left of the umbilicus and __0_HO above/below the umbilicus is still intact with thin film in place.  [facility] Nurse requested for preoperative stoma site marking  Marked for ileostomy in the RLQ  _3___cm to the right of the umbilicus and  ___1_ cm above/below the umbilicus.   Patient's abdomen cleansed with CHG wipes at site markings, allowed to air dry prior to marking.Covered mark with thin film transparent dressing to preserve mark until date of surgery.    [facility] Nurse team will follow up with patient after surgery for continue ostomy care and teaching.  [place] MSN, [place], [place], [place]

## 2021-12-24 ENCOUNTER — Other Ambulatory Visit: Payer: Self-pay | Admitting: Family Medicine

## 2021-12-24 DIAGNOSIS — K209 Esophagitis, unspecified without bleeding: Secondary | ICD-10-CM

## 2021-12-24 DIAGNOSIS — K2970 Gastritis, unspecified, without bleeding: Secondary | ICD-10-CM

## 2021-12-27 ENCOUNTER — Other Ambulatory Visit: Payer: Self-pay

## 2021-12-27 ENCOUNTER — Inpatient Hospital Stay: Payer: Medicare Other | Attending: Oncology | Admitting: Oncology

## 2021-12-27 VITALS — BP 121/55 | HR 75 | Temp 98.1°F | Resp 15 | Wt 128.5 lb

## 2021-12-27 DIAGNOSIS — Z95828 Presence of other vascular implants and grafts: Secondary | ICD-10-CM

## 2021-12-27 DIAGNOSIS — C20 Malignant neoplasm of rectum: Secondary | ICD-10-CM | POA: Diagnosis not present

## 2021-12-27 DIAGNOSIS — C7800 Secondary malignant neoplasm of unspecified lung: Secondary | ICD-10-CM | POA: Insufficient documentation

## 2021-12-27 DIAGNOSIS — I4891 Unspecified atrial fibrillation: Secondary | ICD-10-CM | POA: Diagnosis not present

## 2021-12-27 DIAGNOSIS — R54 Age-related physical debility: Secondary | ICD-10-CM | POA: Diagnosis not present

## 2021-12-27 DIAGNOSIS — Z79899 Other long term (current) drug therapy: Secondary | ICD-10-CM | POA: Insufficient documentation

## 2021-12-27 DIAGNOSIS — Z7901 Long term (current) use of anticoagulants: Secondary | ICD-10-CM | POA: Diagnosis not present

## 2021-12-27 NOTE — Progress Notes (Signed)
Hematology and Oncology Follow Up Visit  Jared White [id number] 1946-01-11 76 y.o. 12/27/2021 11:27 AM Bonnita Hollow, MDIcard, Octavio Graves, DO   Principle Diagnosis: 76 year old man with T3N2 rectal cancer diagnosed in March 2021.  He developed stage IV disease with isolated pulmonary nodule as well as local recurrence around the anastomosis site in 2023.   Prior Therapy:   He is status post colonoscopy and endoscopy completed on August 13, 2019.  Rectal biopsy showed an invasive adenocarcinoma.  Neoadjuvant chemotherapy utilizing FOLFOX started on September 05 2019.   He completed 7 cycles of therapy in August 2021.  Radiation therapy with oral Xeloda started on September 27.  Therapy completed on November 3 of 2021.  He received 45 Gy in 25 fractions to the rectum and regional lymph nodes.  He is status post a robotic assisted lower anterior resection with diverting loop ileostomy completed on June 10, 2020.  The final pathology showed metastatic adenocarcinoma to 2 out 7 lymph nodes with extranodal extension.  No evidence of residual dysplasia or carcinoma of the colonic wall.  Margins were negative.   He is status post loop ileostomy reversal completed by Dr. Marcello Moores on Oct 09, 2020.  Current therapy: Under evaluation for APR.   Interim History: [name] presents today for repeat evaluation.  Since last visit, he was hospitalized in July 2023 with A-fib with rapid ventricular response and found to have a pulmonary nodule.  He also was found to have a anastomosis recurrence and considering APR surgery by Dr. Marcello Moores.  He underwent a biopsy by Dr. Valeta Harms on November 30, 2021 confirmed the presence of metastatic adenocarcinoma of the colon origin.  Since his discharge, he is recovering reasonably well and ambulating with the help of a walker although rarely uses it.  He denies any abdominal pain or discomfort but his bowel habits have been more difficult as of late.  He denies hematochezia or  melena.  Denies any hemoptysis or hematemesis.    Medications: Reviewed without changes. Current Outpatient Medications  Medication Sig Dispense Refill   apixaban (ELIQUIS) 2.5 MG TABS tablet Take 1 tablet (2.5 mg total) by mouth 2 (two) times daily. (Patient not taking: Reported on 12/17/2021) 60 tablet 1   Calcium Carbonate (CALCIUM 500 PO) Take 1,000 mg by mouth in the morning and at bedtime.     diltiazem (CARDIZEM SR) 90 MG 12 hr capsule Take 1 capsule (90 mg total) by mouth 2 (two) times daily. (Patient taking differently: Take 90 mg by mouth daily.) 60 capsule 0   dronedarone (MULTAQ) 400 MG tablet Take 1 tablet (400 mg total) by mouth 2 (two) times daily with a meal. 60 tablet 0   MAGNESIUM PO Take 250 mg by mouth daily.     nitroGLYCERIN (NITROSTAT) 0.4 MG SL tablet Place 0.4 mg under the tongue every 5 (five) minutes x 3 doses as needed for chest pain.     omeprazole (PRILOSEC) 40 MG capsule NEW PRESCRIPTION REQUEST: Omeprazole Dr 40 Mg Capsule - TAKE ONE CAPSULE BY MOUTH TWICE DAILY 180 capsule 0   rosuvastatin (CRESTOR) 20 MG tablet TAKE 1 TABLET BY MOUTH EVERY DAY 90 tablet 3   VITAMIN A PO Take by mouth.     VITAMIN D PO Take by mouth.     No current facility-administered medications for this visit.     Allergies: No Known Allergies    Physical Exam:  Blood pressure (!) 121/55, pulse 75, temperature 98.1 F (36.7 C),  temperature source Temporal, resp. rate 15, weight 128 lb 8 oz (58.3 kg), SpO2 100 %.    ECOG: 1    General appearance: Comfortable appearing without any discomfort Head: Normocephalic without any trauma Oropharynx: Mucous membranes are moist and pink without any thrush or ulcers. Eyes: Pupils are equal and round reactive to light. Lymph nodes: No cervical, supraclavicular, inguinal or axillary lymphadenopathy.   Heart:regular rate and rhythm.  S1 and S2 without leg edema. Lung: Clear without any rhonchi or wheezes.  No dullness to  percussion. Abdomin: Soft, nontender, nondistended with good bowel sounds.  No hepatosplenomegaly. Musculoskeletal: No joint deformity or effusion.  Full range of motion noted. Neurological: No deficits noted on motor, sensory and deep tendon reflex exam. Skin: No petechial rash or dryness.  Appeared moist.                 Lab Results: Lab Results  Component Value Date   WBC 6.0 12/22/2021   HGB 10.6 (L) 12/22/2021   HCT 31.7 (L) 12/22/2021   MCV 97.5 12/22/2021   PLT 151 12/22/2021   PSA 1.9 01/09/2016     Chemistry      Component Value Date/Time   NA 136 12/01/2021 1430   NA 140 12/09/2016 0932   K 4.6 12/01/2021 1430   CL 107 12/01/2021 1430   CO2 20 (L) 12/01/2021 1430   BUN 30 (H) 12/01/2021 1430   BUN 29 (H) 12/09/2016 0932   CREATININE 1.54 (H) 12/01/2021 1430   CREATININE 1.50 (H) 12/01/2020 1025   CREATININE 1.61 (H) 01/21/2016 1030      Component Value Date/Time   CALCIUM 8.5 (L) 12/01/2021 1430   CALCIUM 7.0 (L) 10/09/2015 0458   ALKPHOS 62 11/25/2021 0423   AST 25 11/25/2021 0423   AST 19 12/01/2020 1025   ALT 20 11/25/2021 0423   ALT 13 12/01/2020 1025   BILITOT 0.8 11/25/2021 0423   BILITOT 0.5 12/01/2020 1025           Impression and Plan:  76 year old man with:  1.  Rectal cancer presented with T3N2 in March 2021.  He developed stage IV disease in July 2023 with isolated pulmonary nodule as well as anastomosis recurrence.     The natural course of this disease was discussed at this time and treatment options were reviewed.  The role for salvage systemic chemotherapy were discussed at this time.  Given the fact that he does not have any measurable disease at this time beyond his pulmonary nodule I recommended deferring systemic therapy till after his local therapy is completed.  This will be in the form of FOLFIRI based regimen which I prefer to use if he has disease that is detected.  Given his frail status he will be a marginal  candidate for it and would be used if needed.  2.  IV access: Port-A-Cath currently in use and will be flushed periodically.   3.  Isolated pulmonary metastasis: This is a biopsy-proven findings and I agree with Dr. Tammi Klippel about local treatment with radiation.  4.  Anastomosis recurrence: He is scheduled to undergo APR under the care of Dr. Marcello Moores.  5.  Follow-up: He will return in the next 2 months for repeat follow-up after his surgery and recovery.    30  minutes were dedicated to this encounter.  Time was spent on reviewing imaging studies, disease status update, treatment choices and future plan of care discussion.    [name], MD 8/7/202311:27 AM

## 2021-12-31 ENCOUNTER — Inpatient Hospital Stay (HOSPITAL_COMMUNITY): Payer: Medicare Other | Admitting: Certified Registered Nurse Anesthetist

## 2021-12-31 ENCOUNTER — Encounter (HOSPITAL_COMMUNITY)
Admission: RE | Disposition: A | Discharge status: Home Health | Payer: Self-pay | Source: Home / Self Care | Attending: General Surgery

## 2021-12-31 ENCOUNTER — Inpatient Hospital Stay (HOSPITAL_COMMUNITY)
Admission: RE | Admit: 2021-12-31 | Discharge: 2022-01-02 | DRG: 330 | Disposition: A | Discharge status: Home Health | Payer: Medicare Other | Attending: General Surgery | Admitting: General Surgery

## 2021-12-31 ENCOUNTER — Inpatient Hospital Stay (HOSPITAL_COMMUNITY): Payer: Medicare Other | Admitting: Physician Assistant

## 2021-12-31 ENCOUNTER — Encounter (HOSPITAL_COMMUNITY): Payer: Self-pay | Admitting: General Surgery

## 2021-12-31 ENCOUNTER — Other Ambulatory Visit: Payer: Self-pay

## 2021-12-31 DIAGNOSIS — K660 Peritoneal adhesions (postprocedural) (postinfection): Secondary | ICD-10-CM | POA: Diagnosis present

## 2021-12-31 DIAGNOSIS — Z951 Presence of aortocoronary bypass graft: Secondary | ICD-10-CM

## 2021-12-31 DIAGNOSIS — Z923 Personal history of irradiation: Secondary | ICD-10-CM

## 2021-12-31 DIAGNOSIS — E785 Hyperlipidemia, unspecified: Secondary | ICD-10-CM | POA: Diagnosis present

## 2021-12-31 DIAGNOSIS — I5032 Chronic diastolic (congestive) heart failure: Secondary | ICD-10-CM | POA: Diagnosis present

## 2021-12-31 DIAGNOSIS — Z9221 Personal history of antineoplastic chemotherapy: Secondary | ICD-10-CM | POA: Diagnosis not present

## 2021-12-31 DIAGNOSIS — Z79899 Other long term (current) drug therapy: Secondary | ICD-10-CM | POA: Diagnosis not present

## 2021-12-31 DIAGNOSIS — I2510 Atherosclerotic heart disease of native coronary artery without angina pectoris: Secondary | ICD-10-CM

## 2021-12-31 DIAGNOSIS — K6389 Other specified diseases of intestine: Secondary | ICD-10-CM | POA: Diagnosis present

## 2021-12-31 DIAGNOSIS — D631 Anemia in chronic kidney disease: Secondary | ICD-10-CM | POA: Diagnosis not present

## 2021-12-31 DIAGNOSIS — Z9049 Acquired absence of other specified parts of digestive tract: Secondary | ICD-10-CM | POA: Diagnosis not present

## 2021-12-31 DIAGNOSIS — Z7982 Long term (current) use of aspirin: Secondary | ICD-10-CM | POA: Diagnosis not present

## 2021-12-31 DIAGNOSIS — K5669 Other partial intestinal obstruction: Secondary | ICD-10-CM | POA: Diagnosis present

## 2021-12-31 DIAGNOSIS — C20 Malignant neoplasm of rectum: Secondary | ICD-10-CM | POA: Diagnosis not present

## 2021-12-31 DIAGNOSIS — I509 Heart failure, unspecified: Secondary | ICD-10-CM

## 2021-12-31 DIAGNOSIS — C7800 Secondary malignant neoplasm of unspecified lung: Secondary | ICD-10-CM | POA: Diagnosis present

## 2021-12-31 DIAGNOSIS — I130 Hypertensive heart and chronic kidney disease with heart failure and stage 1 through stage 4 chronic kidney disease, or unspecified chronic kidney disease: Secondary | ICD-10-CM

## 2021-12-31 DIAGNOSIS — I252 Old myocardial infarction: Secondary | ICD-10-CM | POA: Diagnosis not present

## 2021-12-31 DIAGNOSIS — K219 Gastro-esophageal reflux disease without esophagitis: Secondary | ICD-10-CM | POA: Diagnosis present

## 2021-12-31 DIAGNOSIS — R7303 Prediabetes: Secondary | ICD-10-CM | POA: Diagnosis present

## 2021-12-31 DIAGNOSIS — N1832 Chronic kidney disease, stage 3b: Secondary | ICD-10-CM | POA: Diagnosis present

## 2021-12-31 DIAGNOSIS — Z803 Family history of malignant neoplasm of breast: Secondary | ICD-10-CM | POA: Diagnosis not present

## 2021-12-31 DIAGNOSIS — I480 Paroxysmal atrial fibrillation: Secondary | ICD-10-CM | POA: Diagnosis present

## 2021-12-31 HISTORY — PX: LAPAROSCOPIC DIVERTED COLOSTOMY: SHX5892

## 2021-12-31 SURGERY — CREATION, COLOSTOMY, DIVERTING, LAPAROSCOPIC
Anesthesia: General

## 2021-12-31 MED ORDER — DEXAMETHASONE SODIUM PHOSPHATE 4 MG/ML IJ SOLN
INTRAMUSCULAR | Status: DC | PRN
  Administered 2021-12-31: 5 mg via INTRAVENOUS

## 2021-12-31 MED ORDER — HYDROMORPHONE HCL 1 MG/ML IJ SOLN: 0.5000 mg | INTRAMUSCULAR | Status: DC | PRN

## 2021-12-31 MED ORDER — OXYCODONE HCL 5 MG/5ML PO SOLN: 5.0000 mg | Freq: Once | ORAL | Status: DC | PRN

## 2021-12-31 MED ORDER — POLYETHYLENE GLYCOL 3350 17 GM/SCOOP PO POWD: 1.0000 | Freq: Once | ORAL | Status: DC

## 2021-12-31 MED ORDER — SODIUM CHLORIDE 0.9 % IR SOLN
Status: DC | PRN
  Administered 2021-12-31: 2000 mL

## 2021-12-31 MED ORDER — ONDANSETRON HCL 4 MG/2ML IJ SOLN
4.0000 mg | Freq: Once | INTRAMUSCULAR | Status: DC | PRN
Start: 2021-12-31 — End: 2021-12-31

## 2021-12-31 MED ORDER — DRONEDARONE HCL 400 MG PO TABS
400.0000 mg | ORAL_TABLET | Freq: Two times a day (BID) | ORAL | Status: DC
  Administered 2021-12-31 – 2022-01-02 (×4): 400 mg via ORAL
  Filled 2021-12-31 (×4): qty 1

## 2021-12-31 MED ORDER — ALVIMOPAN 12 MG PO CAPS
12.0000 mg | ORAL_CAPSULE | ORAL | Status: AC
  Administered 2021-12-31: 12 mg via ORAL
  Filled 2021-12-31: qty 1

## 2021-12-31 MED ORDER — HYDROMORPHONE HCL 1 MG/ML IJ SOLN: 0.2500 mg | INTRAMUSCULAR | Status: DC | PRN

## 2021-12-31 MED ORDER — CALCIUM CARBONATE ANTACID 500 MG PO CHEW
2.0000 | CHEWABLE_TABLET | Freq: Every day | ORAL | Status: DC
Start: 2021-12-31 — End: 2022-01-02
  Administered 2021-12-31 – 2022-01-02 (×3): 400 mg via ORAL
  Filled 2021-12-31 (×3): qty 2

## 2021-12-31 MED ORDER — LACTATED RINGERS IV SOLN: INTRAVENOUS | Status: DC

## 2021-12-31 MED ORDER — ROCURONIUM BROMIDE 10 MG/ML (PF) SYRINGE
PREFILLED_SYRINGE | INTRAVENOUS | Status: AC
  Filled 2021-12-31: qty 10

## 2021-12-31 MED ORDER — BUPIVACAINE LIPOSOME 1.3 % IJ SUSP
INTRAMUSCULAR | Status: AC
  Filled 2021-12-31: qty 20

## 2021-12-31 MED ORDER — BUPIVACAINE LIPOSOME 1.3 % IJ SUSP
INTRAMUSCULAR | Status: DC | PRN
  Administered 2021-12-31: 20 mL

## 2021-12-31 MED ORDER — ENSURE PRE-SURGERY PO LIQD
592.0000 mL | Freq: Once | ORAL | Status: DC
  Filled 2021-12-31: qty 592

## 2021-12-31 MED ORDER — ALUM & MAG HYDROXIDE-SIMETH 200-200-20 MG/5ML PO SUSP: 30.0000 mL | Freq: Four times a day (QID) | ORAL | Status: DC | PRN

## 2021-12-31 MED ORDER — ENSURE PRE-SURGERY PO LIQD
296.0000 mL | Freq: Once | ORAL | Status: DC
  Filled 2021-12-31: qty 296

## 2021-12-31 MED ORDER — GABAPENTIN 300 MG PO CAPS
300.0000 mg | ORAL_CAPSULE | ORAL | Status: AC
  Administered 2021-12-31: 300 mg via ORAL
  Filled 2021-12-31: qty 1

## 2021-12-31 MED ORDER — SACCHAROMYCES BOULARDII 250 MG PO CAPS
250.0000 mg | ORAL_CAPSULE | Freq: Two times a day (BID) | ORAL | Status: DC
Start: 2021-12-31 — End: 2022-01-02
  Administered 2021-12-31 – 2022-01-02 (×5): 250 mg via ORAL
  Filled 2021-12-31 (×5): qty 1

## 2021-12-31 MED ORDER — LIDOCAINE 2% (20 MG/ML) 5 ML SYRINGE
INTRAMUSCULAR | Status: DC | PRN
  Administered 2021-12-31: 60 mg via INTRAVENOUS

## 2021-12-31 MED ORDER — ONDANSETRON HCL 4 MG/2ML IJ SOLN: 4.0000 mg | Freq: Four times a day (QID) | INTRAMUSCULAR | Status: DC | PRN

## 2021-12-31 MED ORDER — MIDAZOLAM HCL 5 MG/5ML IJ SOLN
INTRAMUSCULAR | Status: DC | PRN
  Administered 2021-12-31: 2 mg via INTRAVENOUS

## 2021-12-31 MED ORDER — CHLORHEXIDINE GLUCONATE 0.12 % MT SOLN
15.0000 mL | Freq: Once | OROMUCOSAL | Status: AC
  Administered 2021-12-31: 15 mL via OROMUCOSAL

## 2021-12-31 MED ORDER — ACETAMINOPHEN 500 MG PO TABS
1000.0000 mg | ORAL_TABLET | Freq: Four times a day (QID) | ORAL | Status: DC
  Administered 2021-12-31 – 2022-01-02 (×6): 1000 mg via ORAL
  Filled 2021-12-31 (×8): qty 2

## 2021-12-31 MED ORDER — EPHEDRINE SULFATE-NACL 50-0.9 MG/10ML-% IV SOSY
PREFILLED_SYRINGE | INTRAVENOUS | Status: DC | PRN
  Administered 2021-12-31 (×3): 5 mg via INTRAVENOUS

## 2021-12-31 MED ORDER — NITROGLYCERIN 0.4 MG SL SUBL: 0.4000 mg | SUBLINGUAL_TABLET | SUBLINGUAL | Status: DC | PRN

## 2021-12-31 MED ORDER — ALVIMOPAN 12 MG PO CAPS
12.0000 mg | ORAL_CAPSULE | Freq: Two times a day (BID) | ORAL | Status: DC
  Administered 2022-01-01 – 2022-01-02 (×3): 12 mg via ORAL
  Filled 2021-12-31 (×3): qty 1

## 2021-12-31 MED ORDER — SODIUM CHLORIDE 0.9 % IV SOLN
2.0000 g | INTRAVENOUS | Status: AC
  Administered 2021-12-31: 2 g via INTRAVENOUS
  Filled 2021-12-31: qty 2

## 2021-12-31 MED ORDER — LIDOCAINE 2% (20 MG/ML) 5 ML SYRINGE
INTRAMUSCULAR | Status: AC
  Filled 2021-12-31: qty 5

## 2021-12-31 MED ORDER — AMISULPRIDE (ANTIEMETIC) 5 MG/2ML IV SOLN
10.0000 mg | Freq: Once | INTRAVENOUS | Status: DC | PRN
Start: 2021-12-31 — End: 2021-12-31

## 2021-12-31 MED ORDER — DEXAMETHASONE SODIUM PHOSPHATE 10 MG/ML IJ SOLN
INTRAMUSCULAR | Status: AC
  Filled 2021-12-31: qty 1

## 2021-12-31 MED ORDER — ACETAMINOPHEN 500 MG PO TABS
1000.0000 mg | ORAL_TABLET | ORAL | Status: AC
  Administered 2021-12-31: 1000 mg via ORAL
  Filled 2021-12-31: qty 2

## 2021-12-31 MED ORDER — MIDAZOLAM HCL 2 MG/2ML IJ SOLN
INTRAMUSCULAR | Status: AC
Start: 2021-12-31 — End: ?
  Filled 2021-12-31: qty 2

## 2021-12-31 MED ORDER — PROPOFOL 1000 MG/100ML IV EMUL
INTRAVENOUS | Status: AC
Start: 2021-12-31 — End: ?
  Filled 2021-12-31: qty 100

## 2021-12-31 MED ORDER — ONDANSETRON HCL 4 MG/2ML IJ SOLN
INTRAMUSCULAR | Status: AC
Start: 2021-12-31 — End: ?
  Filled 2021-12-31: qty 2

## 2021-12-31 MED ORDER — KCL IN DEXTROSE-NACL 20-5-0.45 MEQ/L-%-% IV SOLN
INTRAVENOUS | Status: DC
  Filled 2021-12-31: qty 1000

## 2021-12-31 MED ORDER — BUPIVACAINE HCL (PF) 0.25 % IJ SOLN
INTRAMUSCULAR | Status: AC
Start: 2021-12-31 — End: ?
  Filled 2021-12-31: qty 30

## 2021-12-31 MED ORDER — LACTATED RINGERS IR SOLN
Status: DC | PRN
  Administered 2021-12-31: 1000 mL

## 2021-12-31 MED ORDER — PANTOPRAZOLE SODIUM 40 MG PO TBEC
80.0000 mg | DELAYED_RELEASE_TABLET | Freq: Every day | ORAL | Status: DC
  Administered 2022-01-01 – 2022-01-02 (×2): 80 mg via ORAL
  Filled 2021-12-31 (×2): qty 2

## 2021-12-31 MED ORDER — OXYCODONE HCL 5 MG PO TABS: 5.0000 mg | ORAL_TABLET | Freq: Once | ORAL | Status: DC | PRN

## 2021-12-31 MED ORDER — FENTANYL CITRATE (PF) 100 MCG/2ML IJ SOLN
INTRAMUSCULAR | Status: DC | PRN
  Administered 2021-12-31: 50 ug via INTRAVENOUS
  Administered 2021-12-31: 75 ug via INTRAVENOUS
  Administered 2021-12-31: 25 ug via INTRAVENOUS

## 2021-12-31 MED ORDER — BUPIVACAINE LIPOSOME 1.3 % IJ SUSP: 20.0000 mL | Freq: Once | INTRAMUSCULAR | Status: DC

## 2021-12-31 MED ORDER — FENTANYL CITRATE (PF) 250 MCG/5ML IJ SOLN
INTRAMUSCULAR | Status: AC
  Filled 2021-12-31: qty 5

## 2021-12-31 MED ORDER — ONDANSETRON HCL 4 MG/2ML IJ SOLN
INTRAMUSCULAR | Status: DC | PRN
  Administered 2021-12-31: 4 mg via INTRAVENOUS

## 2021-12-31 MED ORDER — ENOXAPARIN SODIUM 40 MG/0.4ML IJ SOSY
40.0000 mg | PREFILLED_SYRINGE | INTRAMUSCULAR | Status: DC
  Administered 2022-01-01 – 2022-01-02 (×2): 40 mg via SUBCUTANEOUS
  Filled 2021-12-31 (×2): qty 0.4

## 2021-12-31 MED ORDER — DILTIAZEM HCL ER 90 MG PO CP12
90.0000 mg | ORAL_CAPSULE | Freq: Two times a day (BID) | ORAL | Status: DC
  Administered 2021-12-31 – 2022-01-02 (×4): 90 mg via ORAL
  Filled 2021-12-31 (×4): qty 1

## 2021-12-31 MED ORDER — SUGAMMADEX SODIUM 200 MG/2ML IV SOLN
INTRAVENOUS | Status: DC | PRN
  Administered 2021-12-31: 150 mg via INTRAVENOUS

## 2021-12-31 MED ORDER — PROPOFOL 500 MG/50ML IV EMUL
INTRAVENOUS | Status: DC | PRN
  Administered 2021-12-31: 25 ug/kg/min via INTRAVENOUS

## 2021-12-31 MED ORDER — GABAPENTIN 300 MG PO CAPS
300.0000 mg | ORAL_CAPSULE | Freq: Two times a day (BID) | ORAL | Status: DC
  Administered 2021-12-31 – 2022-01-02 (×5): 300 mg via ORAL
  Filled 2021-12-31 (×5): qty 1

## 2021-12-31 MED ORDER — MAGNESIUM OXIDE -MG SUPPLEMENT 400 (240 MG) MG PO TABS
400.0000 mg | ORAL_TABLET | Freq: Every day | ORAL | Status: DC
  Administered 2021-12-31 – 2022-01-02 (×3): 400 mg via ORAL
  Filled 2021-12-31 (×3): qty 1

## 2021-12-31 MED ORDER — ROCURONIUM BROMIDE 10 MG/ML (PF) SYRINGE
PREFILLED_SYRINGE | INTRAVENOUS | Status: DC | PRN
  Administered 2021-12-31: 60 mg via INTRAVENOUS

## 2021-12-31 MED ORDER — ENSURE SURGERY PO LIQD
237.0000 mL | Freq: Two times a day (BID) | ORAL | Status: DC
  Administered 2022-01-01 – 2022-01-02 (×3): 237 mL via ORAL

## 2021-12-31 MED ORDER — HEPARIN SODIUM (PORCINE) 5000 UNIT/ML IJ SOLN
5000.0000 [IU] | Freq: Once | INTRAMUSCULAR | Status: AC
  Administered 2021-12-31: 5000 [IU] via SUBCUTANEOUS
  Filled 2021-12-31: qty 1

## 2021-12-31 MED ORDER — ACETAMINOPHEN 500 MG PO TABS: 1000.0000 mg | ORAL_TABLET | Freq: Once | ORAL | Status: DC

## 2021-12-31 MED ORDER — BUPIVACAINE HCL (PF) 0.25 % IJ SOLN
INTRAMUSCULAR | Status: DC | PRN
  Administered 2021-12-31: 30 mL

## 2021-12-31 MED ORDER — MAGNESIUM 200 MG PO TABS
200.0000 mg | ORAL_TABLET | Freq: Every day | ORAL | Status: DC
  Filled 2021-12-31: qty 1

## 2021-12-31 MED ORDER — SODIUM CHLORIDE 0.9 % IV SOLN
2.0000 g | Freq: Two times a day (BID) | INTRAVENOUS | Status: AC
  Administered 2021-12-31: 2 g via INTRAVENOUS
  Filled 2021-12-31: qty 2

## 2021-12-31 MED ORDER — ONDANSETRON HCL 4 MG PO TABS: 4.0000 mg | ORAL_TABLET | Freq: Four times a day (QID) | ORAL | Status: DC | PRN

## 2021-12-31 MED ORDER — PHENYLEPHRINE 80 MCG/ML (10ML) SYRINGE FOR IV PUSH (FOR BLOOD PRESSURE SUPPORT)
PREFILLED_SYRINGE | INTRAVENOUS | Status: DC | PRN
  Administered 2021-12-31: 160 ug via INTRAVENOUS

## 2021-12-31 MED ORDER — PROPOFOL 10 MG/ML IV BOLUS
INTRAVENOUS | Status: DC | PRN
  Administered 2021-12-31: 100 mg via INTRAVENOUS

## 2021-12-31 MED ORDER — ORAL CARE MOUTH RINSE: 15.0000 mL | Freq: Once | OROMUCOSAL | Status: AC

## 2021-12-31 SURGICAL SUPPLY — 67 items
APPLIER CLIP 5 13 M/L LIGAMAX5 (MISCELLANEOUS) IMPLANT
BAG COUNTER SPONGE SURGICOUNT (BAG) ×2 IMPLANT
BLADE EXTENDED COATED 6.5IN (ELECTRODE) IMPLANT
CABLE HIGH FREQUENCY MONO STRZ (ELECTRODE) IMPLANT
CELLS DAT CNTRL 66122 CELL SVR (MISCELLANEOUS) IMPLANT
CHLORAPREP W/TINT 26 (MISCELLANEOUS) ×2 IMPLANT
CLIP APPLIE 5 13 M/L LIGAMAX5 (MISCELLANEOUS) IMPLANT
DERMABOND ADVANCED (GAUZE/BANDAGES/DRESSINGS) ×2
DERMABOND ADVANCED .7 DNX12 (GAUZE/BANDAGES/DRESSINGS) ×1 IMPLANT
DRAIN CHANNEL 19F RND (DRAIN) IMPLANT
DRAPE LAPAROSCOPIC ABDOMINAL (DRAPES) ×2 IMPLANT
DRAPE SURG IRRIG POUCH 19X23 (DRAPES) ×2 IMPLANT
DRSG OPSITE POSTOP 4X10 (GAUZE/BANDAGES/DRESSINGS) IMPLANT
DRSG OPSITE POSTOP 4X6 (GAUZE/BANDAGES/DRESSINGS) IMPLANT
DRSG OPSITE POSTOP 4X8 (GAUZE/BANDAGES/DRESSINGS) IMPLANT
ELECT REM PT RETURN 15FT ADLT (MISCELLANEOUS) ×2 IMPLANT
EVACUATOR SILICONE 100CC (DRAIN) IMPLANT
GAUZE SPONGE 4X4 12PLY STRL (GAUZE/BANDAGES/DRESSINGS) IMPLANT
GLOVE BIO SURGEON STRL SZ 6.5 (GLOVE) ×4 IMPLANT
GLOVE BIOGEL PI IND STRL 7.0 (GLOVE) ×2 IMPLANT
GLOVE BIOGEL PI INDICATOR 7.0 (GLOVE) ×4
GLOVE INDICATOR 6.5 STRL GRN (GLOVE) ×2 IMPLANT
GOWN STRL REUS W/ TWL XL LVL3 (GOWN DISPOSABLE) ×6 IMPLANT
GOWN STRL REUS W/TWL XL LVL3 (GOWN DISPOSABLE) ×12
HOLDER FOLEY CATH W/STRAP (MISCELLANEOUS) ×2 IMPLANT
IRRIG SUCT STRYKERFLOW 2 WTIP (MISCELLANEOUS) ×2 IMPLANT
IRRIGATION SUCT STRKRFLW 2 WTP (MISCELLANEOUS) ×1 IMPLANT
KIT TURNOVER KIT A (KITS) IMPLANT
PACK COLON (CUSTOM PROCEDURE TRAY) ×2 IMPLANT
PAD POSITIONING PINK XL (MISCELLANEOUS) ×2 IMPLANT
PENCIL SMOKE EVACUATOR (MISCELLANEOUS) IMPLANT
POUCH OSTOMY FLEX CONVEX 2-1/8 (OSTOMY) ×1 IMPLANT
PROTECTOR NERVE ULNAR (MISCELLANEOUS) ×2 IMPLANT
RELOAD PROXIMATE 75MM BLUE (ENDOMECHANICALS) ×2 IMPLANT
RELOAD STAPLE 75 3.8 BLU REG (ENDOMECHANICALS) IMPLANT
RETRACTOR WND ALEXIS 18 MED (MISCELLANEOUS) IMPLANT
RTRCTR WOUND ALEXIS 18CM MED (MISCELLANEOUS) IMPLANT
SCISSORS LAP 5X35 DISP (ENDOMECHANICALS) ×2 IMPLANT
SEALER TISSUE G2 STRG ARTC 35C (ENDOMECHANICALS) ×2 IMPLANT
SET TUBE SMOKE EVAC HIGH FLOW (TUBING) ×2 IMPLANT
SLEEVE Z-THREAD 5X100MM (TROCAR) ×5 IMPLANT
SPIKE FLUID TRANSFER (MISCELLANEOUS) ×2 IMPLANT
SPONGE DRAIN TRACH 4X4 STRL 2S (GAUZE/BANDAGES/DRESSINGS) IMPLANT
STAPLER PROXIMATE 75MM BLUE (STAPLE) ×1 IMPLANT
SURGILUBE 2OZ TUBE FLIPTOP (MISCELLANEOUS) ×2 IMPLANT
SUT ETHILON 2 0 PS N (SUTURE) IMPLANT
SUT NOVA NAB DX-16 0-1 5-0 T12 (SUTURE) ×2 IMPLANT
SUT PROLENE 2 0 KS (SUTURE) IMPLANT
SUT SILK 2 0 (SUTURE) ×2
SUT SILK 2 0 SH CR/8 (SUTURE) IMPLANT
SUT SILK 2-0 18XBRD TIE 12 (SUTURE) ×1 IMPLANT
SUT SILK 3 0 (SUTURE)
SUT SILK 3 0 SH CR/8 (SUTURE) ×2 IMPLANT
SUT SILK 3-0 18XBRD TIE 12 (SUTURE) IMPLANT
SUT VIC AB 2-0 SH 18 (SUTURE) ×3 IMPLANT
SUT VIC AB 4-0 PS2 27 (SUTURE) ×2 IMPLANT
SUT VICRYL 0 UR6 27IN ABS (SUTURE) ×2 IMPLANT
SYS LAPSCP GELPORT 120MM (MISCELLANEOUS) IMPLANT
SYS WOUND ALEXIS 18CM MED (MISCELLANEOUS) ×2 IMPLANT
SYSTEM LAPSCP GELPORT 120MM (MISCELLANEOUS) IMPLANT
SYSTEM WOUND ALEXIS 18CM MED (MISCELLANEOUS) ×1 IMPLANT
TOWEL OR NON WOVEN STRL DISP B (DISPOSABLE) ×2 IMPLANT
TRAY FOLEY MTR SLVR 16FR STAT (SET/KITS/TRAYS/PACK) IMPLANT
TROCAR ADV FIXATION 5X100MM (TROCAR) ×1 IMPLANT
TROCAR BALLN 12MMX100 BLUNT (TROCAR) ×1 IMPLANT
TROCAR Z-THREAD OPTICAL 5X100M (TROCAR) ×2 IMPLANT
TUBING CONNECTING 10 (TUBING) ×2 IMPLANT

## 2021-12-31 NOTE — Anesthesia Preprocedure Evaluation (Addendum)
Anesthesia Evaluation  Patient identified by MRN, date of birth, ID band Patient awake    Reviewed: Allergy & Precautions, NPO status , Patient's Chart, lab work & pertinent test results  Airway Mallampati: II  TM Distance: >3 FB Neck ROM: Full    Dental  (+) Poor Dentition, Missing, Dental Advisory Given, Chipped,    Pulmonary  Metastatic CRC to lung   Pulmonary exam normal breath sounds clear to auscultation       Cardiovascular hypertension, Pt. on medications + CAD, + Past MI, + CABG and +CHF (normal LVEF, grade 1 diastolic dysfunction)  Normal cardiovascular exam+ dysrhythmias (eliquis LD 48h ago) Atrial Fibrillation + Valvular Problems/Murmurs (mild MR) MR  Rhythm:Regular Rate:Normal  Echo 11/2021 1. Left ventricular ejection fraction, by estimation, is 60 to 65%. The  left ventricle has normal function. The left ventricle has no regional  wall motion abnormalities. There is mild left ventricular hypertrophy.  Left ventricular diastolic parameters  are consistent with Grade I diastolic dysfunction (impaired relaxation).  2. Right ventricular systolic function is normal. The right ventricular  size is normal.  3. Left atrial size was moderately dilated.  4. The mitral valve is normal in structure. Mild mitral valve  regurgitation. No evidence of mitral stenosis.  5. The aortic valve is tricuspid. Aortic valve regurgitation is not  visualized. Aortic valve sclerosis is present, with no evidence of aortic  valve stenosis.  6. Aortic dilatation noted. There is borderline dilatation of the  ascending aorta, measuring 39 mm.  7. The inferior vena cava is normal in size with greater than 50%  respiratory variability, suggesting right atrial pressure of 3 mmHg.    Neuro/Psych negative neurological ROS  negative psych ROS   GI/Hepatic Neg liver ROS, GERD  Medicated and Controlled,invasive adenocarcinoma: Neoadjuvant  chemotherapy utilizing FOLFOX started on September 05 2019, completed 7 cycles of therapy in August 2021. Radiation therapy with oral Xeloda started on September 27, completed on November 3 of 2021. S/p robotic assisted lower anterior resection with diverting loop ileostomy completed on June 10, 2020. s/p status post loop ileostomy reversal completed by Dr. Marcello Moores on Oct 09, 2020.  Hospitalized 11/2021 for Afib RVR- pulm nodule discovered, metastatic CRC per bronch biopsy   Endo/Other  diabetes (prediabetic)  Renal/GU CRF and Renal InsufficiencyRenal diseaseCr 1.54  negative genitourinary   Musculoskeletal  (+) Arthritis , Osteoarthritis,    Abdominal   Peds  Hematology  (+) Blood dyscrasia, anemia , Hb 10.6, plt 151   Anesthesia Other Findings   Reproductive/Obstetrics negative OB ROS                           Anesthesia Physical Anesthesia Plan  ASA: 3  Anesthesia Plan: General   Post-op Pain Management: Tylenol PO (pre-op)*   Induction: Intravenous  PONV Risk Score and Plan: 2 and Ondansetron, Dexamethasone, Midazolam and Treatment may vary due to age or medical condition  Airway Management Planned: Oral ETT  Additional Equipment: None  Intra-op Plan:   Post-operative Plan: Extubation in OR  Informed Consent: I have reviewed the patients History and Physical, chart, labs and discussed the procedure including the risks, benefits and alternatives for the proposed anesthesia with the patient or authorized representative who has indicated his/her understanding and acceptance.     Dental advisory given  Plan Discussed with: CRNA  Anesthesia Plan Comments:         Anesthesia Quick Evaluation

## 2021-12-31 NOTE — Consult Note (Signed)
WOC consulted for post op ostomy teaching, [facility] nursing team to follow up for first ostomy assessment Monday 8/14.   [place], [place], [place]

## 2021-12-31 NOTE — Anesthesia Postprocedure Evaluation (Signed)
Anesthesia Post Note  Patient: Jared White  Procedure(s) Performed: LAPAROSCOPIC DIVERTING COLOSTOMY     Patient location during evaluation: PACU Anesthesia Type: General Level of consciousness: awake and alert, oriented and patient cooperative Pain management: pain level controlled Vital Signs Assessment: post-procedure vital signs reviewed and stable Respiratory status: spontaneous breathing, nonlabored ventilation and respiratory function stable Cardiovascular status: blood pressure returned to baseline and stable Postop Assessment: no apparent nausea or vomiting Anesthetic complications: no   No notable events documented.  Last Vitals:  Vitals:   12/31/21 0945 12/31/21 1000  BP: (!) 127/58 (!) 133/59  Pulse: 60 60  Resp: 18 16  Temp: 36.5 C   SpO2: 99% 99%    Last Pain:  Vitals:   12/31/21 0945  TempSrc:   PainSc: 0-No pain                 Pervis Hocking

## 2021-12-31 NOTE — Transfer of Care (Signed)
Immediate Anesthesia Transfer of Care Note  Patient: Jared White  Procedure(s) Performed: LAPAROSCOPIC DIVERTING COLOSTOMY  Patient Location: PACU  Anesthesia Type:General  Level of Consciousness: awake and patient cooperative  Airway & Oxygen Therapy: Patient Spontanous Breathing and Patient connected to face mask  Post-op Assessment: Report given to RN and Post -op Vital signs reviewed and stable  Post vital signs: Reviewed and stable  Last Vitals:  Vitals Value Taken Time  BP 119/44 12/31/21 0920  Temp    Pulse 59 12/31/21 0923  Resp 15 12/31/21 0923  SpO2 100 % 12/31/21 0923  Vitals shown include unvalidated device data.  Last Pain:  Vitals:   12/31/21 0618  TempSrc: Oral         Complications: No notable events documented.

## 2021-12-31 NOTE — Anesthesia Procedure Notes (Signed)
Procedure Name: Intubation Date/Time: 12/31/2021 7:30 AM  Performed by: Claudia Desanctis, CRNAPre-anesthesia Checklist: Patient identified, Emergency Drugs available, Suction available and Patient being monitored Patient Re-evaluated:Patient Re-evaluated prior to induction Oxygen Delivery Method: Circle system utilized Preoxygenation: Pre-oxygenation with 100% oxygen Induction Type: IV induction Ventilation: Mask ventilation without difficulty Laryngoscope Size: Miller and 3 Grade View: Grade I Tube type: Oral Tube size: 7.0 mm Number of attempts: 1 Airway Equipment and Method: Stylet Placement Confirmation: ETT inserted through vocal cords under direct vision, positive ETCO2 and breath sounds checked- equal and bilateral Secured at: 21 cm Tube secured with: Tape Dental Injury: Teeth and Oropharynx as per pre-operative assessment

## 2021-12-31 NOTE — Op Note (Signed)
12/31/2021  9:00 AM  PATIENT:  Jared White  76 y.o. male  Patient Care Team: Bonnita Hollow, MD as PCP - General (Family Medicine) Delrae Rend, MD as Consulting Physician (Endocrinology) [place], [place] as Attending Physician (Neurosurgery)  PRE-OPERATIVE DIAGNOSIS:  recurrent rectal cancer  POST-OPERATIVE DIAGNOSIS:  recurrent rectal cancer  PROCEDURE:  LAPAROSCOPIC DIVERTING COLOSTOMY   Surgeon(s): Leighton Ruff, MD Michael Boston, MD  ASSISTANT: Dr Johney Maine   ANESTHESIA:   local and general  EBL:  Total I/O In: 100 [IV Piggyback:100] Out: -   Delay start of Pharmacological VTE agent (>24hrs) due to surgical blood loss or risk of bleeding:  no  DRAINS: none   SPECIMEN:  Source of Specimen:  small bowel nodule  DISPOSITION OF SPECIMEN:  PATHOLOGY  COUNTS:  YES  PLAN OF CARE: Admit to inpatient   PATIENT DISPOSITION:  PACU - hemodynamically stable.  INDICATION:    76 y.o. M with recurrent rectal cancer, s/p LAR and coloanal anastomosis, now with metastatic disease.  After a long discussion with the patient, his medical team and his caregivers, we recommended diverting ostomy:  The anatomy & physiology of the digestive tract was discussed.  The pathophysiology was discussed.  Natural history risks without surgery was discussed.   I worked to give an overview of the disease and the frequent need to have multispecialty involvement.  I feel the risks of no intervention will lead to serious problems that outweigh the operative risks; therefore, I recommended a partial colectomy to remove the pathology.  Laparoscopic & open techniques were discussed.   Risks such as bleeding, infection, abscess, leak, reoperation, possible ostomy, hernia, heart attack, death, and other risks were discussed.  I noted a good likelihood this will help address the problem.   Goals of post-operative recovery were discussed as well.    The patient expressed  understanding & wished to proceed with surgery.  OR FINDINGS:   Patient had significant pelvic adhesions.  Possible metastatic disease on visceral parietal peritoneum or liver.  DESCRIPTION:   Informed consent was confirmed.  The patient underwent general anaesthesia without difficulty.  The patient was positioned appropriately.  VTE prevention in place.  The patient's abdomen was clipped, prepped, & draped in a sterile fashion.  Surgical timeout confirmed our plan.  The patient was positioned in reverse Trendelenburg.  Abdominal entry was gained using a Varies needle in the LUQ.  Entry was clean.  I induced carbon dioxide insufflation.  An 23m laparoscopic port was placed in the RUQ.  Camera inspection revealed no injury.  Extra ports were carefully placed under direct laparoscopic visualization.  I laparoscopically reflected the greater omentum and the upper abdomen the small bowel in the upper abdomen. The patient was appropriately positioned and the robot was docked to the patient's left side.  Instruments were placed under direct visualization.  I inspected the bowel and abdomen.  There were significant adhesions in the pelvis, especially around the pelvic sidewall.  There was a nodule noted on the small bowel.  This was removed using sharp dissection without bowel wall injury.  This was sent to pathology for evaluation of peritoneal metastatic disease.     I mobilized the colon off of the lateral sidewall using an Enseal device.  A 13mport was placed through the previously marked ostomy site.  The colon reached to the level.  The abdomen was de-sufflated and the colon was pulled into the ostomy defect.  The abdomen was  re-insufflated and examined and irrigated.  There was no twisting of the colon.  The mesentery was intact.  Hemostasis was good.    The ostomy was then stapled with a 73m GIA stapler.  The port sites were closed with 4-0 Vicryl interrupted sutures and dermabond.  The ostomy  was then matured in POberlinfashion with 2-0 Vicryl sutures.  An ostomy appliance was placed.    An MD assistant was necessary for tissue manipulation, retraction and positioning due to the complexity of the case.  ARosario Adie MD  [facility]

## 2021-12-31 NOTE — H&P (Signed)
MRN: [id number] DOB: 15-Jan-1946 DATE OF ENCOUNTER: 11/16/2021 Interval History:   76 year old male who presented to the office with a diagnosis of rectal cancer seen on colonoscopy in March.  He completed total neoadjuvant treatment with Dr. Alen Blew.  He finished radiation in early Nov 21.  Follow-up CT scans showed a slightly enlarged mass but no signs of metastatic disease prior to RT.  His CEA levels have been normal.  He underwent low anterior resection on June 11, 2019.  A diverting ileostomy was created.  He was then taken to the OR in May 2022 for ileostomy reversal after confirmation that his anastomosis was healed.  He has now recovered from this and is doing well.  F/u endoscopy shows an anastomotic polyp.  Bx show at least HGD.   Surgical biopsy shows adenocarcinoma.  MRI shows no T4 disease.  He states that he is having bowel function but this is more of chronic leakage.  We have had to cancel his surgery once due to no bowel prep performed and then more recently due to hypocalcemia on lab work that the patient failed to get addressed prior to surgery.          Past Medical History:  Diagnosis Date   CHF (congestive heart failure) (CMS-HCC)     Chronic kidney disease     GERD (gastroesophageal reflux disease)     History of cancer               Past Surgical History:  Procedure Laterality Date   heart surgery  N/A     lung surgery  N/A      Social History  Social History           Socioeconomic History   Marital status: Single  Tobacco Use   Smoking status: Never   Smokeless tobacco: Never  Vaping Use   Vaping Use: Never used  Substance and Sexual Activity   Alcohol use: Never   Drug use: Never               Family History  Problem Relation Age of Onset   Breast cancer Sister      Social History  Social History           Socioeconomic History   Marital status: Single  Tobacco Use   Smoking status: Never   Smokeless tobacco: Never  Vaping Use   Vaping  Use: Never used  Substance and Sexual Activity   Alcohol use: Never   Drug use: Never                 Current Outpatient Medications on File Prior to Visit  Medication Sig Dispense Refill   aspirin 81 MG EC tablet Take 81 mg by mouth once daily       atenoloL (TENORMIN) 50 MG tablet Take 50 mg by mouth once daily       calcium polycarbophiL (FIBERCON) 625 mg tablet Take 1 tablet (625 mg total) by mouth 2 (two) times daily 60 tablet 1   valsartan (DIOVAN) 160 MG tablet Take 160 mg by mouth once daily        No current facility-administered medications on file prior to visit.   No Known Allergies Review of Systems - Negative except as stated above     Physical Examination:    Gen: NAD Abd: Soft CV RRR Lungs: CTA Incision: Healing appropriately   MRI personally reviewed.  There is no sign of invasion into other pelvic organs.  Assessment and Plan:    Jared White is a 76 y.o. male who underwent low anterior resection and primary anastomosis with diverting ileostomy in January 2022 and ileostomy reversal 3 months later.  Postoperatively he has complained of irregular bowel habits.  Follow-up colonoscopy showed a possible anastomotic recurrence.  Biopsies showed high-grade dysplasia.   He was taken to the operating room for exam under anesthesia and deeper biopsies.  This did show an anastomotic recurrence. Pt unable to get to surgery due to a series of complications.  CT Chest now shows metastatic disease. After discussion with his oncologists and other friends family and the patient himself, we decided to proceed with a diverting ostomy only.     The surgery and anatomy were described to the patient as well as the risks of surgery and the possible complications.  These include: Bleeding, deep abdominal infections and possible wound complications such as hernia and infection, damage to adjacent structures, possible need for other procedures, such as abscess drains in radiology,  possible prolonged hospital stay, possible constipation from narcotics, prolonged fatigue/weakness or appetite loss, possible early recurrence of of disease, possible complications of their medical problems such as heart disease or arrhythmias or lung problems, death (less than 1%). I believe the patient understands and wishes to proceed with the surgery.   Diagnoses and all orders for this visit:   Local recurrence of rectal cancer (CMS-HCC)             No follow-ups on file.     The plan was discussed in detail with the patient today, who expressed understanding.  The patient has my contact information, and understands to call me with any additional questions or concerns in the interval.  I would be happy to see the patient back sooner if the need arises.    Rosario Adie, MD Colon and Rectal Surgery [facility]

## 2022-01-01 ENCOUNTER — Encounter (HOSPITAL_COMMUNITY): Payer: Self-pay | Admitting: General Surgery

## 2022-01-01 LAB — CBC
HCT: 28.1 % — ABNORMAL LOW (ref 39.0–52.0)
Hemoglobin: 9.3 g/dL — ABNORMAL LOW (ref 13.0–17.0)
MCH: 32.4 pg (ref 26.0–34.0)
MCHC: 33.1 g/dL (ref 30.0–36.0)
MCV: 97.9 fL (ref 80.0–100.0)
Platelets: 138 10*3/uL — ABNORMAL LOW (ref 150–400)
RBC: 2.87 MIL/uL — ABNORMAL LOW (ref 4.22–5.81)
RDW: 13.8 % (ref 11.5–15.5)
WBC: 8.7 10*3/uL (ref 4.0–10.5)
nRBC: 0 % (ref 0.0–0.2)

## 2022-01-01 LAB — BASIC METABOLIC PANEL
Anion gap: 7 (ref 5–15)
BUN: 18 mg/dL (ref 8–23)
CO2: 23 mmol/L (ref 22–32)
Calcium: 7.6 mg/dL — ABNORMAL LOW (ref 8.9–10.3)
Chloride: 109 mmol/L (ref 98–111)
Creatinine, Ser: 1.44 mg/dL — ABNORMAL HIGH (ref 0.61–1.24)
GFR, Estimated: 51 mL/min — ABNORMAL LOW (ref >60)
Glucose, Bld: 144 mg/dL — ABNORMAL HIGH (ref 70–99)
Potassium: 3.8 mmol/L (ref 3.5–5.1)
Sodium: 139 mmol/L (ref 135–145)

## 2022-01-01 MED ORDER — OXYCODONE HCL 5 MG PO TABS: 5.0000 mg | ORAL_TABLET | ORAL | Status: DC | PRN

## 2022-01-01 NOTE — Progress Notes (Signed)
1 Day Post-Op lap colostomy creation Subjective: No complaints, tolerating liquids  Objective: Vital signs in last 24 hours: Temp:  [97.4 F (36.3 C)-97.7 F (36.5 C)] 97.5 F (36.4 C) (08/12 0838) Pulse Rate:  [53-69] 53 (08/12 0838) Resp:  [15-20] 18 (08/12 0838) BP: (108-134)/(44-74) 108/60 (08/12 0838) SpO2:  [98 %-100 %] 100 % (08/12 0838)   Intake/Output from previous day: 08/11 0701 - 08/12 0700 In: 1780 [P.O.:480; I.V.:1200; IV Piggyback:100] Out: 2610 [Urine:2325; Stool:275; Blood:10] Intake/Output this shift: Total I/O In: 220 [P.O.:120; IV Piggyback:100] Out: -    General appearance: alert and cooperative GI: soft, nondistended Ostomy pink and viable Incision: no significant drainage  Lab Results:  Recent Labs    01/01/22 0418  WBC 8.7  HGB 9.3*  HCT 28.1*  PLT 138*   BMET Recent Labs    01/01/22 0418  NA 139  K 3.8  CL 109  CO2 23  GLUCOSE 144*  BUN 18  CREATININE 1.44*  CALCIUM 7.6*   PT/INR No results for input(s): "LABPROT", "INR" in the last 72 hours. ABG No results for input(s): "PHART", "HCO3" in the last 72 hours.  Invalid input(s): "PCO2", "PO2"  MEDS, Scheduled  acetaminophen  1,000 mg Oral Q6H   alvimopan  12 mg Oral BID   calcium carbonate  2 tablet Oral Daily   diltiazem  90 mg Oral BID   dronedarone  400 mg Oral BID WC   enoxaparin (LOVENOX) injection  40 mg Subcutaneous Q24H   feeding supplement  237 mL Oral BID BM   gabapentin  300 mg Oral BID   magnesium oxide  400 mg Oral Daily   pantoprazole  80 mg Oral Daily   saccharomyces boulardii  250 mg Oral BID    Studies/Results: No results found.  Assessment: s/p Procedure(s): LAPAROSCOPIC DIVERTING COLOSTOMY Patient Active Problem List   Diagnosis Date Noted   Rectal cancer metastasized to lung ([place]) 12/31/2021   Dizziness 12/20/2021   Lung nodule 11/30/2021   PAF (paroxysmal atrial fibrillation) (H[facility])    Protein-calorie malnutrition, severe 11/23/2021    Acute renal failure superimposed on stage 3b chronic kidney disease ([place]) 11/21/2021   Hydroureteronephrosis 11/21/2021   Lumbar back pain 11/21/2021   Elevated troponin 11/21/2021   Pre-op exam 08/11/2021   Ileostomy in place [facility]) 10/09/2020   AKI (acute kidney injury) ([place]) 08/24/2020   Preop cardiovascular exam 03/19/2020   Gastric polyps 02/07/2020   History of rectal cancer 02/07/2020   History of colonic polyps 02/07/2020   Barrett's esophagus without dysplasia 02/07/2020   Port-A-Cath in place 11/27/2019   Weight loss 11/27/2019   Rectal adenocarcinoma metastatic to lung ([place]) 08/19/2019   Abnormal MRI, pelvis 07/26/2019   Abnormal CT scan, pelvis 07/26/2019   Rectal bleeding 07/26/2019   Hemorrhoids 07/26/2019   Constipation 07/26/2019   Stage 3b chronic kidney disease ([place]) 09/13/2018   Coronary artery disease involving native coronary artery of native heart without angina pectoris 09/13/2018   Microalbuminuria 10/16/2015   Facial numbness    Hypomagnesemia    Hypocalcemia 10/08/2015   Psoriasis 11/21/2013   S/P CABG x 4 03/14/2013   Multiple thoracic/lumbar transverse process fractures 12/15/2012   Fall from roof 12/15/2012   CAD (coronary artery disease) 12/15/2012   GERD (gastroesophageal reflux disease) 12/15/2012   Essential hypertension 12/15/2012   Hyperlipidemia 12/15/2012   Multiple bilateral rib fractures     Expected post op course  Plan: d/c foley Advance diet   LOS: 1 day     .  Rosario Adie, MD [facility], [place]    01/01/2022 9:05 AM

## 2022-01-02 LAB — BASIC METABOLIC PANEL
Anion gap: 6 (ref 5–15)
BUN: 25 mg/dL — ABNORMAL HIGH (ref 8–23)
CO2: 23 mmol/L (ref 22–32)
Calcium: 7 mg/dL — ABNORMAL LOW (ref 8.9–10.3)
Chloride: 111 mmol/L (ref 98–111)
Creatinine, Ser: 1.55 mg/dL — ABNORMAL HIGH (ref 0.61–1.24)
GFR, Estimated: 46 mL/min — ABNORMAL LOW (ref >60)
Glucose, Bld: 118 mg/dL — ABNORMAL HIGH (ref 70–99)
Potassium: 3.7 mmol/L (ref 3.5–5.1)
Sodium: 140 mmol/L (ref 135–145)

## 2022-01-02 LAB — CBC
HCT: 26.7 % — ABNORMAL LOW (ref 39.0–52.0)
Hemoglobin: 8.6 g/dL — ABNORMAL LOW (ref 13.0–17.0)
MCH: 32 pg (ref 26.0–34.0)
MCHC: 32.2 g/dL (ref 30.0–36.0)
MCV: 99.3 fL (ref 80.0–100.0)
Platelets: 152 10*3/uL (ref 150–400)
RBC: 2.69 MIL/uL — ABNORMAL LOW (ref 4.22–5.81)
RDW: 14.3 % (ref 11.5–15.5)
WBC: 8.4 10*3/uL (ref 4.0–10.5)
nRBC: 0 % (ref 0.0–0.2)

## 2022-01-02 MED ORDER — OXYCODONE HCL 5 MG PO TABS
5.0000 mg | ORAL_TABLET | ORAL | 0 refills | Status: DC | PRN
  Filled 2022-01-02: qty 15, 3d supply, fill #0

## 2022-01-02 NOTE — TOC Transition Note (Signed)
Transition of Care [facility]) - CM/SW Discharge Note   Patient Details  Name: Jared White MRN: [id number] Date of Birth: 04-Jan-1946  [facility] CM/SW Contact:  Lennart Pall, LCSW Phone Number: 01/02/2022, 10:00 AM   Clinical Narrative:    Met with pt to review dc needs and orders placed for [facility].  Pt notes he is already active with [facility] (they are providing PT/OT currently).  Have confirmed with [facility] liaison that they can add in [facility] services.  Pt cleared for dc today.  No further TOC needs.   Final next level of care: [place] Barriers to Discharge: No Barriers Identified   Patient Goals and CMS Choice Patient states their goals for this hospitalization and ongoing recovery are:: return home      Discharge Placement                       Discharge Plan and Services                DME Arranged: N/A DME Agency: NA       HH Arranged: RN [place] Agency: [place] Date [place]: 01/02/22 Time HH Agency Contacted: 1000 Representative spoke with at [place]: [place] ([place]) Interventions     Readmission Risk Interventions    01/02/2022    9:59 AM 08/26/2020    1:33 PM  Readmission Risk Prevention Plan  Transportation Screening Complete Complete  PCP or Specialist Appt within 3-5 Days Complete Complete  HRI or Home Care Consult Complete Complete  Social Work Consult for [place] Planning/Counseling Complete Complete  Palliative Care Screening Not Applicable Not Applicable  Medication Review Press photographer) Complete Complete

## 2022-01-02 NOTE — Discharge Instructions (Signed)
SURGERY: POST OP INSTRUCTIONS (Surgery for small bowel obstruction, colon resection, etc)   ######################################################################  EAT Gradually transition to a high fiber diet with a fiber supplement over the next few days after discharge  WALK Walk an hour a day.  Control your pain to do that.    CONTROL PAIN Control pain so that you can walk, sleep, tolerate sneezing/coughing, go up/down stairs.  HAVE A BOWEL MOVEMENT DAILY Keep your bowels regular to avoid problems.  OK to try a laxative to override constipation.  OK to use an antidairrheal to slow down diarrhea.  Call if not better after 2 tries  CALL IF YOU HAVE PROBLEMS/CONCERNS Call if you are still struggling despite following these instructions. Call if you have concerns not answered by these instructions  ######################################################################   DIET Follow a light diet the first few days at home.  Start with a bland diet such as soups, liquids, starchy foods, low fat foods, etc.  If you feel full, bloated, or constipated, stay on a ful liquid or pureed/blenderized diet for a few days until you feel better and no longer constipated. Be sure to drink plenty of fluids every day to avoid getting dehydrated (feeling dizzy, not urinating, etc.). Gradually add a fiber supplement to your diet over the next week.  Gradually get back to a regular solid diet.  Avoid fast food or heavy meals the first week as you are more likely to get nauseated. It is expected for your digestive tract to need a few months to get back to normal.  It is common for your bowel movements and stools to be irregular.  You will have occasional bloating and cramping that should eventually fade away.  Until you are eating solid food normally, off all pain medications, and back to regular activities; your bowels will not be normal. Focus on eating a low-fat, high fiber diet the rest of your life  (See Getting to Good Bowel Health, below).  CARE of your INCISION or WOUND  It is good for closed incisions and even open wounds to be washed every day.  Shower every day.  Short baths are fine.  Wash the incisions and wounds clean with soap & water.    You may leave closed incisions open to air if it is dry.   You may cover the incision with clean gauze & replace it after your daily shower for comfort.  STAPLES: You have skin staples.  Leave them in place & set up an appointment for them to be removed by a surgery office nurse ~10 days after surgery. = 1st week of January 2024    ACTIVITIES as tolerated Start light daily activities --- self-care, walking, climbing stairs-- beginning the day after surgery.  Gradually increase activities as tolerated.  Control your pain to be active.  Stop when you are tired.  Ideally, walk several times a day, eventually an hour a day.   Most people are back to most day-to-day activities in a few weeks.  It takes 4-8 weeks to get back to unrestricted, intense activity. If you can walk 30 minutes without difficulty, it is safe to try more intense activity such as jogging, treadmill, bicycling, low-impact aerobics, swimming, etc. Save the most intensive and strenuous activity for last (Usually 4-8 weeks after surgery) such as sit-ups, heavy lifting, contact sports, etc.  Refrain from any intense heavy lifting or straining until you are off narcotics for pain control.  You will have off days, but things should improve   week-by-week. DO NOT PUSH THROUGH PAIN.  Let pain be your guide: If it hurts to do something, don't do it.  Pain is your body warning you to avoid that activity for another week until the pain goes down. You may drive when you are no longer taking narcotic prescription pain medication, you can comfortably wear a seatbelt, and you can safely make sudden turns/stops to protect yourself without hesitating due to pain. You may have sexual intercourse when it  is comfortable. If it hurts to do something, stop.  MEDICATIONS Take your usually prescribed home medications unless otherwise directed.   Blood thinners:  Usually you can restart any strong blood thinners after the second postoperative day.  It is OK to take aspirin right away.     If you are on strong blood thinners (warfarin/Coumadin, Plavix, Xerelto, Eliquis, Pradaxa, etc), discuss with your surgeon, medicine PCP, and/or cardiologist for instructions on when to restart the blood thinner & if blood monitoring is needed (PT/INR blood check, etc).     PAIN CONTROL Pain after surgery or related to activity is often due to strain/injury to muscle, tendon, nerves and/or incisions.  This pain is usually short-term and will improve in a few months.  To help speed the process of healing and to get back to regular activity more quickly, DO THE FOLLOWING THINGS TOGETHER: Increase activity gradually.  DO NOT PUSH THROUGH PAIN Use Ice and/or Heat Try Gentle Massage and/or Stretching Take over the counter pain medication Take Narcotic prescription pain medication for more severe pain  Good pain control = faster recovery.  It is better to take more medicine to be more active than to stay in bed all day to avoid medications.  Increase activity gradually Avoid heavy lifting at first, then increase to lifting as tolerated over the next 6 weeks. Do not "push through" the pain.  Listen to your body and avoid positions and maneuvers than reproduce the pain.  Wait a few days before trying something more intense Walking an hour a day is encouraged to help your body recover faster and more safely.  Start slowly and stop when getting sore.  If you can walk 30 minutes without stopping or pain, you can try more intense activity (running, jogging, aerobics, cycling, swimming, treadmill, sex, sports, weightlifting, etc.) Remember: If it hurts to do it, then don't do it! Use Ice and/or Heat You will have swelling and  bruising around the incisions.  This will take several weeks to resolve. Ice packs or heating pads (6-8 times a day, 30-60 minutes at a time) will help sooth soreness & bruising. Some people prefer to use ice alone, heat alone, or alternate between ice & heat.  Experiment and see what works best for you.  Consider trying ice for the first few days to help decrease swelling and bruising; then, switch to heat to help relax sore spots and speed recovery. Shower every day.  Short baths are fine.  It feels good!  Keep the incisions and wounds clean with soap & water.   Try Gentle Massage and/or Stretching Massage at the area of pain many times a day Stop if you feel pain - do not overdo it Take over the counter pain medication This helps the muscle and nerve tissues become less irritable and calm down faster Choose ONE of the following over-the-counter anti-inflammatory medications: Acetaminophen 500mg tabs (Tylenol) 1-2 pills with every meal and just before bedtime (avoid if you have liver problems or if you have   acetaminophen in you narcotic prescription) Naproxen 220mg tabs (ex. Aleve, Naprosyn) 1-2 pills twice a day (avoid if you have kidney, stomach, IBD, or bleeding problems) Ibuprofen 200mg tabs (ex. Advil, Motrin) 3-4 pills with every meal and just before bedtime (avoid if you have kidney, stomach, IBD, or bleeding problems) Take with food/snack several times a day as directed for at least 2 weeks to help keep pain / soreness down & more manageable. Take Narcotic prescription pain medication for more severe pain A prescription for strong pain control is often given to you upon discharge (for example: oxycodone/Percocet, hydrocodone/Norco/Vicodin, or tramadol/Ultram) Take your pain medication as prescribed. Be mindful that most narcotic prescriptions contain Tylenol (acetaminophen) as well - avoid taking too much Tylenol. If you are having problems/concerns with the prescription medicine (does  not control pain, nausea, vomiting, rash, itching, etc.), please call us [phone] to see if we need to switch you to a different pain medicine that will work better for you and/or control your side effects better. If you need a refill on your pain medication, you must call the office before 4 pm and on weekdays only.  By federal law, prescriptions for narcotics cannot be called into a pharmacy.  They must be filled out on paper & picked up from our office by the patient or authorized caretaker.  Prescriptions cannot be filled after 4 pm nor on weekends.    WHEN TO CALL US [phone] Severe uncontrolled or worsening pain  Fever over 101 F (38.5 C) Concerns with the incision: Worsening pain, redness, rash/hives, swelling, bleeding, or drainage Reactions / problems with new medications (itching, rash, hives, nausea, etc.) Nausea and/or vomiting Difficulty urinating Difficulty breathing Worsening fatigue, dizziness, lightheadedness, blurred vision Other concerns If you are not getting better after two weeks or are noticing you are getting worse, contact our office [phone] for further advice.  We may need to adjust your medications, re-evaluate you in the office, send you to the emergency room, or see what other things we can do to help. The clinic staff is available to answer your questions during regular business hours (8:30am-5pm).  Please don't hesitate to call and ask to speak to one of our nurses for clinical concerns.    A surgeon from [facility] is always on call at the hospitals 24 hours/day If you have a medical emergency, go to the nearest emergency room or call 911.  FOLLOW UP in our office One the day of your discharge from the hospital (or the next business weekday), please call [facility] to set up or confirm an appointment to see your surgeon in the office for a follow-up appointment.  Usually it is 2-3 weeks after your surgery.   If you  have skin staples at your incision(s), let the office know so we can set up a time in the office for the nurse to remove them (usually around 10 days after surgery). Make sure that you call for appointments the day of discharge (or the next business weekday) from the hospital to ensure a convenient appointment time. IF YOU HAVE DISABILITY OR FAMILY LEAVE FORMS, BRING THEM TO THE OFFICE FOR PROCESSING.  DO NOT GIVE THEM TO YOUR DOCTOR.  [facility] [address], [address], [place], [place]  [address] ? [phone] - Main [phone] - Toll Free,  [phone] - Fax [email or web address]    GETTING TO GOOD BOWEL HEALTH. It is expected for your digestive tract to   need a few months to get back to normal.  It is common for your bowel movements and stools to be irregular.  You will have occasional bloating and cramping that should eventually fade away.  Until you are eating solid food normally, off all pain medications, and back to regular activities; your bowels will not be normal.   Avoiding constipation The goal: ONE SOFT BOWEL MOVEMENT A DAY!    Drink plenty of fluids.  Choose water first. TAKE A FIBER SUPPLEMENT EVERY DAY THE REST OF YOUR LIFE During your first week back home, gradually add back a fiber supplement every day Experiment which form you can tolerate.   There are many forms such as powders, tablets, wafers, gummies, etc Psyllium bran (Metamucil), methylcellulose (Citrucel), Miralax or Glycolax, Benefiber, Flax Seed.  Adjust the dose week-by-week (1/2 dose/day to 6 doses a day) until you are moving your bowels 1-2 times a day.  Cut back the dose or try a different fiber product if it is giving you problems such as diarrhea or bloating. Sometimes a laxative is needed to help jump-start bowels if constipated until the fiber supplement can help regulate your bowels.  If you are tolerating eating & you are farting, it is okay to try a gentle  laxative such as double dose MiraLax, prune juice, or Milk of Magnesia.  Avoid using laxatives too often. Stool softeners can sometimes help counteract the constipating effects of narcotic pain medicines.  It can also cause diarrhea, so avoid using for too long. If you are still constipated despite taking fiber daily, eating solids, and a few doses of laxatives, call our office. Controlling diarrhea Try drinking liquids and eating bland foods for a few days to avoid stressing your intestines further. Avoid dairy products (especially milk & ice cream) for a short time.  The intestines often can lose the ability to digest lactose when stressed. Avoid foods that cause gassiness or bloating.  Typical foods include beans and other legumes, cabbage, broccoli, and dairy foods.  Avoid greasy, spicy, fast foods.  Every person has some sensitivity to other foods, so listen to your body and avoid those foods that trigger problems for you. Probiotics (such as active yogurt, Align, etc) may help repopulate the intestines and colon with normal bacteria and calm down a sensitive digestive tract Adding a fiber supplement gradually can help thicken stools by absorbing excess fluid and retrain the intestines to act more normally.  Slowly increase the dose over a few weeks.  Too much fiber too soon can backfire and cause cramping & bloating. It is okay to try and slow down diarrhea with a few doses of antidiarrheal medicines.   Bismuth subsalicylate (ex. Kayopectate, Pepto Bismol) for a few doses can help control diarrhea.  Avoid if pregnant.   Loperamide (Imodium) can slow down diarrhea.  Start with one tablet (2mg) first.  Avoid if you are having fevers or severe pain.  ILEOSTOMY PATIENTS WILL HAVE CHRONIC DIARRHEA since their colon is not in use.    Drink plenty of liquids.  You will need to drink even more glasses of water/liquid a day to avoid getting dehydrated. Record output from your ileostomy.  Expect to empty  the bag every 3-4 hours at first.  Most people with a permanent ileostomy empty their bag 4-6 times at the least.   Use antidiarrheal medicine (especially Imodium) several times a day to avoid getting dehydrated.  Start with a dose at bedtime & breakfast.  Adjust up or   down as needed.  Increase antidiarrheal medications as directed to avoid emptying the bag more than 8 times a day (every 3 hours). Work with your wound ostomy nurse to learn care for your ostomy.  See ostomy care instructions. TROUBLESHOOTING IRREGULAR BOWELS 1) Start with a soft & bland diet. No spicy, greasy, or fried foods.  2) Avoid gluten/wheat or dairy products from diet to see if symptoms improve. 3) Miralax 17gm or flax seed mixed in 8oz. water or juice-daily. May use 2-4 times a day as needed. 4) Gas-X, Phazyme, etc. as needed for gas & bloating.  5) Prilosec (omeprazole) over-the-counter as needed 6)  Consider probiotics (Align, Activa, etc) to help calm the bowels down  Call your doctor if you are getting worse or not getting better.  Sometimes further testing (cultures, endoscopy, X-ray studies, CT scans, bloodwork, etc.) may be needed to help diagnose and treat the cause of the diarrhea. [facility] [address], [address], [place], [place]  [address] [phone] - Main.    [phone]  - Toll Free.   [phone] - Fax [email or web address]   ###############################   #######################################################  Ostomy Support Information  You've heard that people get along just fine with only one of their eyes, or one of their lungs, or one of their kidneys. But you also know that you have only one intestine and only one bladder, and that leaves you feeling awfully empty, both physically and emotionally: You think no other people go around without part of their intestine with the ends of their intestines sticking out through their abdominal walls.    YOU ARE NOT ALONE.  There are nearly three quarters of a million people in the [place] who have an ostomy; people who have had surgery to remove all or part of their colons or bladders.   There is even a national association, the [employer] with over 350 local affiliated support groups that are organized by volunteers who provide peer support and counseling. [employer] has a toll free telephone num-ber, 800-826-0826 and an educational, interactive website, www.ostomy.org   An ostomy is an opening in the belly (abdominal wall) made by surgery. Ostomates are people who have had this procedure. The opening (stoma) allows the kidney or bowel to grdischarge waste. An external pouch covers the stoma to collect waste. Pouches are are a simple bag and are odor free. Different companies have disposable or reusable pouches to fit one's lifestyle. An ostomy can either be temporary or permanent.   THERE ARE THREE MAIN TYPES OF OSTOMIES Colostomy. A colostomy is a surgically created opening in the large intestine (colon). Ileostomy. An ileostomy is a surgically created opening in the small intestine. Urostomy. A urostomy is a surgically created opening to divert urine away from the bladder.  OSTOMY Care  The following guidelines will make care of your colostomy easier. Keep this information close by for quick reference.  Helpful DIET hints Eat a well-balanced diet including vegetables and fresh fruits. Eat on a regular schedule.  Drink at least 6 to 8 glasses of fluids daily. Eat slowly in a relaxed atmosphere. Chew your food thoroughly. Avoid chewing gum, smoking, and drinking from a straw. This will help decrease the amount of air you swallow, which may help reduce gas. Eating yogurt or drinking buttermilk may help reduce gas.  To control gas at night, do not eat after 8 p.m. This will give your bowel time to quiet down before you go   to bed.  If gas is a problem, you can purchase  Beano. Sprinkle Beano on the first bite of food before eating to reduce gas. It has no flavor and should not change the taste of your food. You can buy Beano over the counter at your local drugstore.  Foods like fish, onions, garlic, broccoli, asparagus, and cabbage produce odor. Although your pouch is odor-proof, if you eat these foods you may notice a stronger odor when emptying your pouch. If this is a concern, you may want to limit these foods in your diet.  If you have an ileostomy, you will have chronic diarrhea & need to drink more liquids to avoid getting dehydrated.  Consider antidiarrheal medicine like imodium (loperamide) or Lomotil to help slow down bowel movements / diarrhea into your ileostomy bag.  GETTING TO GOOD BOWEL HEALTH WITH AN ILEOSTOMY    With the colon bypassed & not in use, you will have small bowel diarrhea.   It is important to thicken & slow your bowel movements down.   The goal: 4-6 small BOWEL MOVEMENTS A DAY It is important to drink plenty of liquids to avoid getting dehydrated  CONTROLLING ILEOSTOMY DIARRHEA  TAKE A FIBER SUPPLEMENT (FiberCon or Benefiner soluble fiber) twice a day - to thicken stools by absorbing excess fluid and retrain the intestines to act more normally.  Slowly increase the dose over a few weeks.  Too much fiber too soon can backfire and cause cramping & bloating.  TAKE AN IRON SUPPLEMENT twice a day to naturally constipate your bowels.  Usually ferrous sulfate 325mg twice a day)  TAKE ANTI-DIARRHEAL MEDICINES: Loperamide (Imodium) can slow down diarrhea.  Start with two tablets (= 4mg) first and then try one tablet every 6 hours.  Can go up to 2 pills four times day (8 pills of 2mg max) Avoid if you are having fevers or severe pain.  If you are not better or start feeling worse, stop all medicines and call your doctor for advice LoMotil (Diphenoxylate / Atropine) is another medicine that can constipate & slow down bowel moevements Pepto  Bismol (bismuth) can gently thicken bowels as well  If diarrhea is worse,: drink plenty of liquids and try simpler foods for a few days to avoid stressing your intestines further. Avoid dairy products (especially milk & ice cream) for a short time.  The intestines often can lose the ability to digest lactose when stressed. Avoid foods that cause gassiness or bloating.  Typical foods include beans and other legumes, cabbage, broccoli, and dairy foods.  Every person has some sensitivity to other foods, so listen to our body and avoid those foods that trigger problems for you.Call your doctor if you are getting worse or not better.  Sometimes further testing (cultures, endoscopy, X-ray studies, bloodwork, etc) may be needed to help diagnose and treat the cause of the diarrhea. Take extra anti-diarrheal medicines (maximum is 8 pills of 2mg loperamide a day)   Tips for POUCHING an OSTOMY   Changing Your Pouch The best time to change your pouch is in the morning, before eating or drinking anything. Your stoma can function at any time, but it will function more after eating or drinking.   Applying the pouching system  Place all your equipment close at hand before removing your pouch.  Wash your hands.  Stand or sit in front of a mirror. Use the position that works best for you. Remember that you must keep the skin around the stoma   wrinkle-free for a good seal.  Gently remove the used pouch (1-piece system) or the pouch and old wafer (2-piece system). Empty the pouch into the toilet. Save the closure clip to use again.  Wash the stoma itself and the skin around the stoma. Your stoma may bleed a little when being washed. This is normal. Rinse and pat dry. You may use a wash cloth or soft paper towels (like Bounty), mild soap (like Dial, Safeguard, or Ivory), and water. Avoid soaps that contain perfumes or lotions.  For a new pouch (1-piece system) or a new wafer (2-piece system), measure your  stoma using the stoma guide in each box of supplies.  Trace the shape of your stoma onto the back of the new pouch or the back of the new wafer. Cut out the opening. Remove the paper backing and set it aside.  Optional: Apply a skin barrier powder to surrounding skin if it is irritated (bare or weeping), and dust off the excess. Optional: Apply a skin-prep wipe (such as Skin Prep or All-Kare) to the skin around the stoma, and let it dry. Do not apply this solution if the skin is irritated (red, tender, or broken) or if you have shaved around the stoma. Optional: Apply a skin barrier paste (such as Stomahesive, Coloplast, or Premium) around the opening cut in the back of the pouch or wafer. Allow it to dry for 30 to 60 seconds.  Hold the pouch (1-piece system) or wafer (2-piece system) with the sticky side toward your body. Make sure the skin around the stoma is wrinkle-free. Center the opening on the stoma, then press firmly to your abdomen (Fig. 4). Look in the mirror to check if you are placing the pouch, or wafer, in the right position. For a 2-piece system, snap the pouch onto the wafer. Make sure it snaps into place securely.  Place your hand over the stoma and the pouch or wafer for about 30 seconds. The heat from your hand can help the pouch or wafer stick to your skin.  Add deodorant (such as Super Banish or Nullo) to your pouch. Other options include food extracts such as vanilla oil and peppermint extract. Add about 10 drops of the deodorant to the pouch. Then apply the closure clamp. Note: Do not use toxic  chemicals or commercial cleaning agents in your pouch. These substances may harm the stoma.  Optional: For extra seal, apply tape to all 4 sides around the pouch or wafer, as if you were framing a picture. You may use any brand of medical adhesive tape. Change your pouch every 5 to 7 days. Change it immediately if a leak occurs.  Wash your hands afterwards.  If you are wearing a  2-piece system, you may use 2 new pouches per week and alternate them. Rinse the pouch with mild soap and warm water and hang it to dry for the next day. Apply the fresh pouch. Alternate the 2 pouches like this for a week. After a week, change the wafer and begin with 2 new pouches. Place the old pouches in a plastic bag, and put them in the trash.   LIVING WITH AN OSTOMY  Emptying Your Pouch Empty your pouch when it is one-third full (of urine, stool, and/or gas). If you wait until your pouch is fuller than this, it will be more difficult to empty and more noticeable. When you empty your pouch, either put toilet paper in the toilet bowl first, or flush the   toilet while you empty the pouch. This will reduce splashing. You can empty the pouch between your legs or to one side while sitting, or while standing or stooping. If you have a 2-piece system, you can snap off the pouch to empty it. Remember that your stoma may function during this time. If you wish to rinse your pouch after you empty it, a turkey baster can be helpful. When using a baster, squirt water up into the pouch through the opening at the bottom. With a 2-piece system, you can snap off the pouch to rinse it. After rinsing  your pouch, empty it into the toilet. When rinsing your pouch at home, put a few granules of Dreft soap in the rinse water. This helps lubricate and freshen your pouch. The inside of your pouch can be sprayed with non-stick cooking oil (Pam spray). This may help reduce stool sticking to the inside of the pouch.  Bathing You may shower or bathe with your pouch on or off. Remember that your stoma may function during this time.  The materials you use to wash your stoma and the skin around it should be clean, but they do not need to be sterile.  Wearing Your Pouch During hot weather, or if you perspire a lot in general, wear a cover over your pouch. This may prevent a rash on your skin under the pouch. Pouch covers are  sold at ostomy supply stores. Wear the pouch inside your underwear for better support. Watch your weight. Any gain or loss of 10 to 15 pounds or more can change the way your pouch fits.  Going Away From Home A collapsible cup (like those that come in travel kits) or a soft plastic squirt bottle with a pull-up top (like a travel bottle for shampoo) can be used for rinsing your pouch when you are away from home. Tilt the opening of the pouch at an upward angle when using a cup to rinse.  Carry wet wipes or extra tissues to use in public bathrooms.  Carry an extra pouching system with you at all times.  Never keep ostomy supplies in the glove compartment of your car. Extreme heat or cold can damage the skin barriers and adhesive wafers on the pouch.  When you travel, carry your ostomy supplies with you at all times. Keep them within easy reach. Do not pack ostomy supplies in baggage that will be checked or otherwise separated from you, because your baggage might be lost. If you're traveling out of the country, it is helpful to have a letter stating that you are carrying ostomy supplies as a medical necessity.  If you need ostomy supplies while traveling, look in the yellow pages of the telephone book under "Surgical Supplies." Or call the local ostomy organization to find out where supplies are available.  Do not let your ostomy supplies get low. Always order new pouches before you use the last one.  Reducing Odor Limit foods such as broccoli, cabbage, onions, fish, and garlic in your diet to help reduce odor. Each time you empty your pouch, carefully clean the opening of the pouch, both inside and outside, with toilet paper. Rinse your pouch 1 or 2 times daily after you empty it (see directions for emptying your pouch and going away from home). Add deodorant (such as Super Banish or Nullo) to your pouch. Use air deodorizers in your bathroom. Do not add aspirin to your pouch. Even though  aspirin can help prevent odor, it   could cause ulcers on your stoma.  When to call the doctor Call the doctor if you have any of the following symptoms: Purple, black, or white stoma Severe cramps lasting more than 6 hours Severe watery discharge from the stoma lasting more than 6 hours No output from the colostomy for 3 days Excessive bleeding from your stoma Swelling of your stoma to more than 1/2-inch larger than usual Pulling inward of your stoma below skin level Severe skin irritation or deep ulcers Bulging or other changes in your abdomen  When to call your ostomy nurse Call your ostomy/enterostomal therapy (WOCN) nurse if any of the following occurs: Frequent leaking of your pouching system Change in size or appearance of your stoma, causing discomfort or problems with your pouch Skin rash or rawness Weight gain or loss that causes problems with your pouch     FREQUENTLY ASKED QUESTIONS   Why haven't you met any of these folks who have an ostomy?  Well, maybe you have! You just did not recognize them because an ostomy doesn't show. It can be kept secret if you wish. Why, maybe some of your best friends, office associates or neighbors have an ostomy ... you never can tell. People facing ostomy surgery have many quality-of-life questions like: Will you bulge? Smell? Make noises? Will you feel waste leaving your body? Will you be a captive of the toilet? Will you starve? Be a social outcast? Get/stay married? Have babies? Easily bathe, go swimming, bend over?  OK, let's look at what you can expect:   Will you bulge?  Remember, without part of the intestine or bladder, and its contents, you should have a flatter tummy than before. You can expect to wear, with little exception, what you wore before surgery ... and this in-cludes tight clothing and bathing suits.   Will you smell?  Today, thanks to modern odor proof pouching systems, you can walk into an ostomy support group  meeting and not smell anything that is foul or offensive. And, for those with an ileostomy or colostomy who are concerned about odor when emptying their pouch, there are in-pouch deodorants that can be used to eliminate any waste odors that may exist.   Will you make noises?  Everyone produces gas, especially if they are an air-swallower. But intestinal sounds that occur from time to time are no differ-ent than a gurgling tummy, and quite often your clothing will muffle any sounds.   Will you feel the waste discharges?  For those with a colostomy or ileostomy there might be a slight pressure when waste leaves your body, but understand that the intestines have no nerve endings, so there will be no unpleasant sensations. Those with a urostomy will probably be unaware of any kidney drainage.   Will you be a captive of the toilet?  Immediately post-op you will spend more time in the bathroom than you will after your body recovers from surgery. Every person is different, but on average those with an ileostomy or urostomy may empty their pouches 4 to 6 times a day; a little  less if you have a colostomy. The average wear time between pouch system changes is 3 to 5 days and the changing process should take less than 30 minutes.   Will I need to be on a special diet? Most people return to their normal diet when they have recovered from surgery. Be sure to chew your food well, eat a well-balanced diet and drink plenty of fluids. If   you experience problems with a certain food, wait a couple of weeks and try it again.  Will there be odor and noises? Pouching systems are designed to be odor-proof or odor-resistant. There are deodorants that can be used in the pouch. Medications are also available to help reduce odor. Limit gas-producing foods and carbonated beverages. You will experience less gas and fewer noises as you heal from surgery.  How much time will it take to care for my ostomy? At first, you may  spend a lot of time learning about your ostomy and how to take care of it. As you become more comfortable and skilled at changing the pouching system, it will take very little time to care for it.   Will I be able to return to work? People with ostomies can perform most jobs. As soon as you have healed from surgery, you should be able to return to work. Heavy lifting (more than 10 pounds) may be discouraged.   What about intimacy? Sexual relationships and intimacy are important and fulfilling aspects of your life. They should continue after ostomy surgery. Intimacy-related concerns should be discussed openly between you and your partner.   Can I wear regular clothing? You do not need to wear special clothing. Ostomy pouches are fairly flat and barely noticeable. Elastic undergarments will not hurt the stoma or prevent the ostomy from functioning.   Can I participate in sports? An ostomy should not limit your involvement in sports. Many people with ostomies are runners, skiers, swimmers or participate in other active lifestyles. Talk with your caregiver first before doing heavy physical activity.  Will you starve?  Not if you follow doctor's orders at each stage of your post-op adjustment. There is no such thing as an "ostomy diet". Some people with an ostomy will be able to eat and tolerate anything; others may find diffi-culty with some foods. Each person is an individual and must determine, by trial, what is best for them. A good practice for all is to drink plenty of water.   Will you be a social outcast?  Have you met anyone who has an ostomy and is a social outcast? Why should you be the first? Only your attitude and self image will effect how you are treated. No confi-dent person is an outcast.    PROFESSIONAL HELP   Resources are available if you need help or have questions about your ostomy.   Specially trained nurses called Wound, Ostomy Continence Nurses (WOCN) are available for  consultation in most major medical centers.  Consider getting an ostomy consult at an outpatient ostomy clinic.    has an Ostomy Clinic run by an WOCN ostomy nurse at the [facility] campus.  [phone]. [facility] can help set up an appointment   The United Ostomy Association (UOA) is a group made up of many local chapters throughout the [place]. These local groups hold meetings and provide support to prospective and existing ostomates. They sponsor educational events and have qualified visitors to make personal or telephone visits. Contact the UOA for the chapter nearest you and for other educational publications.  More detailed information can be found in Colostomy Guide, a publication of the United Ostomy Association (UOA). Contact UOA at 1-800-826-0826 or visit their web site at www.uoaa.org. The website contains links to other sites, suppliers and resources.  [employer] Secure Start Services: Start at the website to enlist for support.  Your Wound Ostomy (WOCN) nurse may have started this   process. https://www.[employer].com/en/securestart Secure Start services are designed to support people as they live their lives with an ostomy or neurogenic bladder. Enrolling is easy and at no cost to the patient. We realize that each person's needs and life journey are different. Through Secure Start services, we want to help people live their life, their way.  #######################################################  

## 2022-01-02 NOTE — Discharge Summary (Signed)
Physician Discharge Summary  Patient ID: Jared White MRN: [id number] DOB/AGE: 1945-07-29 76 y.o.  Admit date: 12/31/2021 Discharge date: 01/02/2022  Admission Diagnoses: Recurrent rectal cancer, partially obstructing  Discharge Diagnoses:  Principal Problem:   Rectal cancer metastasized to lung [facility])   Discharged Condition: good  Hospital Course: Patient was admitted to the med surg floor after surgery.  Diet was advanced as tolerated.  Patient began to have bowel function on postop day 1.  By postop day 2, he was tolerating a solid diet and pain was controlled with oral medications.  He was urinating without difficulty and ambulating without assistance.  Patient was felt to be in stable condition for discharge to home.   Consults: None  Significant Diagnostic Studies: labs: cbc, bmet  Treatments: IV hydration and surgery: lap colostomy  Discharge Exam: Blood pressure (!) 114/52, pulse 72, temperature 97.6 F (36.4 C), temperature source Oral, resp. rate 18, height '5\' 8"'$  (1.727 m), weight 54.4 kg, SpO2 100 %. General appearance: alert and cooperative GI: soft, non-distended Incision/Wound: clean, intact Ostomy viable and productive  Disposition: Discharge disposition: 01-Home or Self Care        Allergies as of 01/02/2022   No Known Allergies      Medication List     TAKE these medications    apixaban 2.5 MG Tabs tablet Commonly known as: Eliquis Take 1 tablet (2.5 mg total) by mouth 2 (two) times daily.   CALCIUM 500 PO Take 1,000 mg by mouth in the morning and at bedtime.   diltiazem 90 MG 12 hr capsule Commonly known as: CARDIZEM SR Take 1 capsule (90 mg total) by mouth 2 (two) times daily. What changed: when to take this   dronedarone 400 MG tablet Commonly known as: MULTAQ Take 1 tablet (400 mg total) by mouth 2 (two) times daily with a meal.   MAGNESIUM PO Take 250 mg by mouth daily.   nitroGLYCERIN 0.4 MG SL tablet Commonly known as:  NITROSTAT Place 0.4 mg under the tongue every 5 (five) minutes x 3 doses as needed for chest pain.   omeprazole 40 MG capsule Commonly known as: PRILOSEC NEW PRESCRIPTION REQUEST: Omeprazole Dr 40 Mg Capsule - TAKE ONE CAPSULE BY MOUTH TWICE DAILY   oxyCODONE 5 MG immediate release tablet Commonly known as: Oxy IR/ROXICODONE Take 1 tablet (5 mg total) by mouth every 4 (four) hours as needed for moderate pain, severe pain or breakthrough pain.   rosuvastatin 20 MG tablet Commonly known as: CRESTOR TAKE 1 TABLET BY MOUTH EVERY DAY   VITAMIN A PO Take by mouth.   VITAMIN D PO Take by mouth.         Signed: Rosario Adie 6/46/8032, 8:58 AM

## 2022-01-02 NOTE — Progress Notes (Addendum)
Patient discharge instructions given verbal and written. All questions answered. Patient made aware that wound ostomy nurse would like to see him before he leaves. Patient states he has done this before. He explained how to irrigate the colostomy bag. He has emptied the bag prior to discharge. Personal belongings with patient at time of d/c. Patient in stable condition

## 2022-01-03 ENCOUNTER — Telehealth: Payer: Self-pay | Admitting: Radiation Oncology

## 2022-01-03 ENCOUNTER — Other Ambulatory Visit (HOSPITAL_COMMUNITY): Payer: Self-pay

## 2022-01-03 LAB — SURGICAL PATHOLOGY

## 2022-01-03 NOTE — Telephone Encounter (Signed)
LVM for pt's BIL to c/b to schedule FUN for pt.

## 2022-01-03 NOTE — Telephone Encounter (Signed)
Spoke with pt's BIL who confirmed appt for 9/1. Will notify Lead therapists to sched SIM.

## 2022-01-04 ENCOUNTER — Telehealth: Payer: Self-pay | Admitting: Family Medicine

## 2022-01-04 NOTE — Telephone Encounter (Signed)
Spoke with Beth with [facility] and advised her of annotation below. She verbalized understanding.

## 2022-01-04 NOTE — Telephone Encounter (Signed)
Caller Name: [facility] Call back phone #: [phone]  Reason for Call: Verbal orders for nursing twice a week for 2 weeks and once a week for 4 weeks. Asked to resume PT and OT, and to approve B-12 54mcrograms per day. Asked whether or not he should be taking Eloquis

## 2022-01-05 ENCOUNTER — Telehealth: Payer: Self-pay

## 2022-01-12 ENCOUNTER — Encounter: Payer: Self-pay | Admitting: Cardiology

## 2022-01-12 ENCOUNTER — Ambulatory Visit: Payer: Medicare Other | Admitting: Cardiology

## 2022-01-12 VITALS — BP 145/70 | HR 73 | Temp 98.0°F | Resp 16 | Ht 68.0 in | Wt 126.0 lb

## 2022-01-12 DIAGNOSIS — I480 Paroxysmal atrial fibrillation: Secondary | ICD-10-CM

## 2022-01-12 DIAGNOSIS — I2510 Atherosclerotic heart disease of native coronary artery without angina pectoris: Secondary | ICD-10-CM

## 2022-01-12 DIAGNOSIS — I10 Essential (primary) hypertension: Secondary | ICD-10-CM

## 2022-01-12 MED ORDER — ATENOLOL 50 MG PO TABS: 50.0000 mg | ORAL_TABLET | Freq: Every day | ORAL | 3 refills | Status: DC

## 2022-01-12 MED ORDER — DILTIAZEM HCL ER COATED BEADS 120 MG PO CP24: 120.0000 mg | ORAL_CAPSULE | Freq: Every day | ORAL | 3 refills | Status: AC

## 2022-01-12 NOTE — Progress Notes (Signed)
Subjective:   Jared White, male    DOB: 12-16-1945, 76 y.o.   MRN: [id number]  Chief complaint:  Coronary artery disease    HPI  75-y/o Caucasian male with hypertension, CKD III w/IgA nephropathy, hyperlipidemia, coronary artery disease status post CABG 2001 by Dr. Prescott Gum (LIMA-LAD, SVG-Diag, seg SVG-ramus & OM, SVG-PDA), PAF, CKD III w/IgA nephropathy, rectal mass s/p radiation, neoadjuvant treatment, diverting ileostomy s/p reversal, now with rectal cancer recurrence and mets to lung  Patient recently underwent surgery for recurrent rectal cancer, currently has colostomy bag. Hs going to be on radiotherapy going forwards. He is tolerating Eliquis well without any bleeding issues.     Current Outpatient Medications:    Calcium Carbonate (CALCIUM 500 PO), Take 1,000 mg by mouth in the morning and at bedtime., Disp: , Rfl:    diltiazem (CARDIZEM SR) 90 MG 12 hr capsule, Take 1 capsule (90 mg total) by mouth 2 (two) times daily. (Patient taking differently: Take 90 mg by mouth daily.), Disp: 60 capsule, Rfl: 0   dronedarone (MULTAQ) 400 MG tablet, Take 1 tablet (400 mg total) by mouth 2 (two) times daily with a meal., Disp: 60 tablet, Rfl: 0   MAGNESIUM PO, Take 250 mg by mouth daily., Disp: , Rfl:    nitroGLYCERIN (NITROSTAT) 0.4 MG SL tablet, Place 0.4 mg under the tongue every 5 (five) minutes x 3 doses as needed for chest pain., Disp: , Rfl:    omeprazole (PRILOSEC) 40 MG capsule, NEW PRESCRIPTION REQUEST: Omeprazole Dr 40 Mg Capsule - TAKE ONE CAPSULE BY MOUTH TWICE DAILY, Disp: 180 capsule, Rfl: 0   oxyCODONE (OXY IR/ROXICODONE) 5 MG immediate release tablet, Take 1 tablet (5 mg total) by mouth every 4 (four) hours as needed for moderate pain, severe pain or breakthrough pain., Disp: 15 tablet, Rfl: 0   rosuvastatin (CRESTOR) 20 MG tablet, TAKE 1 TABLET BY MOUTH EVERY DAY, Disp: 90 tablet, Rfl: 3   VITAMIN A PO, Take by mouth., Disp: , Rfl:    VITAMIN D PO, Take by mouth., Disp:  , Rfl:    apixaban (ELIQUIS) 2.5 MG TABS tablet, Take 1 tablet (2.5 mg total) by mouth 2 (two) times daily. (Patient not taking: Reported on 12/17/2021), Disp: 60 tablet, Rfl: 1    Cardiovascular studies:  EKG 12/09/2021: Sinus rhythm 70 bpm  Nonspecific T-abnormality  Echocardiogram 03/23/2020:  Normal LV systolic function with visual EF 50-55%. Abnormal septal wall  motion due to post-operative coronary artery bypass graft. Left ventricle  cavity is normal in size. Normal global wall motion. Normal diastolic  filling pattern, normal LAP. Calculated EF 54%.  Structurally normal tricuspid valve. No evidence of tricuspid stenosis.  Mild tricuspid regurgitation. No evidence of pulmonary hypertension.  No other significant valvular abnormalities are seen.  Compared to prior study dated 2014: no significant change noted.   Lexiscan (Walking with mod Bruce)Tetrofosmin Stress Test  03/30/2020: Nondiagnostic ECG stress. Converted to walking Lexiscan after 3:00 Bruce attempt due to dyspnea.  Myocardial perfusion is normal with mild diaphragmatic attenuation in inferior wall. minimal ischemia in this region cannot be excluded.  Overall LV systolic function is normal without regional wall motion abnormalities. Stress LV EF: 57%.  No previous exam available for comparison. Low risk.   Recent labs: 12/01/2020: Glucose 100, BUN/Cr 28/1.50. EGFR 49. Na/K 140/3.2. Rest of the CMP normal H/H 10/29. MCV 91. Platelets 129 HbA1C 5.9% No recent lipid panel  03/19/2020: Glucose 115, BUN/Cr 20/1.44. EGFR 51. Na/K 140/3.8.  Rest of the CMP normal H/H 12/36. MCV 99. Platelets 121  07/14/2017: H/H 13.7/41.  MCV 92.  Platelets 169 Glucose 119, BUN/creatinine 28/1.81.  EGFR 37.  Sodium 140, potassium 4.7  Review of Systems  Cardiovascular:  Negative for chest pain, dyspnea on exertion, leg swelling, palpitations and syncope.       Vitals:   01/12/22 0949  BP: (!) 145/70  Pulse: 73  Resp: 16   Temp: 98 F (36.7 C)  SpO2: 99%    Physical Exam: Physical Exam Vitals and nursing note reviewed.  Constitutional:      General: He is not in acute distress.    Appearance: He is cachectic.  Neck:     Vascular: No JVD.  Cardiovascular:     Rate and Rhythm: Normal rate and regular rhythm.     Heart sounds: Normal heart sounds. No murmur heard. Pulmonary:     Effort: Pulmonary effort is normal.     Breath sounds: Normal breath sounds. No wheezing or rales.  Musculoskeletal:     Right lower leg: No edema.     Left lower leg: No edema.      No diagnosis found.  No orders of the defined types were placed in this encounter.       Assessment & Recommendations:  75-y/o Caucasian male with hypertension, CKD III w/IgA nephropathy, hyperlipidemia, coronary artery disease status post CABG 2001 by Dr. Prescott Gum (LIMA-LAD, SVG-Diag, seg SVG-ramus & OM, SVG-PDA), PAF, CKD III w/IgA nephropathy, rectal mass s/p radiation, neoadjuvant treatment, diverting ileostomy s/p reversal, now with rectal cancer recurrence and mets to lung  PAF: Currently in sinus rhythm. While A-fib was most likely triggered by his significant electrolyte abnormalities, it lasted for >24 hrs. I recommended Multaq to get him through his rectal cancer surgery.  Okay to stop now and pursue rate control approach only. Refill atenolol 50 mg daily.  Switch diltiazem SR 90 mg-which patient was taking once daily, to diltiazem 24-hour 120 mg daily. CHA2DS2VASc score at least 5, annual stroke risk >7%.  Continue Eliquis 2.5 mg twice daily, which she is tolerating well.    CAD: Trop elevation (11/2021) in the setting of Afib w/RVR. Normal LVEF. Not on Aspirin due to ongoing use of eliquis.  Continue Crestor 20 mg.  Hypertension: Fairly well controlled  CKD 3: F/u w/nephrology.  F/u in 3 months   [place], MD [facility] Pager: [phone] Office: [phone] If no answer Cell  [phone]

## 2022-01-18 NOTE — Telephone Encounter (Signed)
Agree with holding eliquis in the setting of ongoing rectal bleeding.  Thanks MJP

## 2022-01-18 NOTE — Telephone Encounter (Signed)
LVM per Patients' Nurse request to give her a verbal that Dr. Virgina Jock agreed to hold Eliquis

## 2022-01-18 NOTE — Telephone Encounter (Signed)
Patients'  home health nurse Nira Conn from [facility] called and states that the patient was having some rectal bleeding and was advised from a friend of his family who is a cardiologist to stop taking the Eliquis . The nurse was concerned and wanted to know if he should stop taking this medication . Please advise

## 2022-01-21 ENCOUNTER — Encounter: Payer: Self-pay | Admitting: Urology

## 2022-01-21 ENCOUNTER — Ambulatory Visit
Admission: RE | Admit: 2022-01-21 | Discharge: 2022-01-21 | Disposition: A | Discharge status: Home / Self Care | Payer: Medicare Other | Source: Ambulatory Visit | Attending: Radiation Oncology | Admitting: Radiation Oncology

## 2022-01-21 ENCOUNTER — Telehealth: Payer: Self-pay

## 2022-01-21 ENCOUNTER — Ambulatory Visit
Admission: RE | Admit: 2022-01-21 | Discharge: 2022-01-21 | Disposition: A | Discharge status: Home / Self Care | Payer: Medicare Other | Source: Ambulatory Visit | Attending: Urology | Admitting: Urology

## 2022-01-21 ENCOUNTER — Other Ambulatory Visit: Payer: Self-pay

## 2022-01-21 VITALS — BP 147/64 | HR 54 | Temp 97.7°F | Resp 20 | Ht 68.0 in | Wt 124.8 lb

## 2022-01-21 DIAGNOSIS — R011 Cardiac murmur, unspecified: Secondary | ICD-10-CM | POA: Diagnosis not present

## 2022-01-21 DIAGNOSIS — C20 Malignant neoplasm of rectum: Secondary | ICD-10-CM | POA: Diagnosis present

## 2022-01-21 DIAGNOSIS — I509 Heart failure, unspecified: Secondary | ICD-10-CM | POA: Diagnosis not present

## 2022-01-21 DIAGNOSIS — Z923 Personal history of irradiation: Secondary | ICD-10-CM | POA: Insufficient documentation

## 2022-01-21 DIAGNOSIS — Z8042 Family history of malignant neoplasm of prostate: Secondary | ICD-10-CM | POA: Diagnosis not present

## 2022-01-21 DIAGNOSIS — I10 Essential (primary) hypertension: Secondary | ICD-10-CM | POA: Diagnosis not present

## 2022-01-21 DIAGNOSIS — K219 Gastro-esophageal reflux disease without esophagitis: Secondary | ICD-10-CM | POA: Insufficient documentation

## 2022-01-21 DIAGNOSIS — E785 Hyperlipidemia, unspecified: Secondary | ICD-10-CM | POA: Insufficient documentation

## 2022-01-21 DIAGNOSIS — N028 Recurrent and persistent hematuria with other morphologic changes: Secondary | ICD-10-CM | POA: Insufficient documentation

## 2022-01-21 DIAGNOSIS — M129 Arthropathy, unspecified: Secondary | ICD-10-CM | POA: Insufficient documentation

## 2022-01-21 DIAGNOSIS — I2510 Atherosclerotic heart disease of native coronary artery without angina pectoris: Secondary | ICD-10-CM | POA: Insufficient documentation

## 2022-01-21 DIAGNOSIS — Z87442 Personal history of urinary calculi: Secondary | ICD-10-CM | POA: Diagnosis not present

## 2022-01-21 DIAGNOSIS — Z79899 Other long term (current) drug therapy: Secondary | ICD-10-CM | POA: Insufficient documentation

## 2022-01-21 DIAGNOSIS — Z510 Encounter for antineoplastic radiation therapy: Secondary | ICD-10-CM | POA: Diagnosis present

## 2022-01-21 DIAGNOSIS — C7801 Secondary malignant neoplasm of right lung: Secondary | ICD-10-CM | POA: Insufficient documentation

## 2022-01-21 NOTE — Progress Notes (Signed)
Radiation Oncology         (336) [phone] ________________________________  Outpatient Re-Consult  Name: Jared White MRN: [id number]  Date of Service: 01/21/2022 DOB: [birth year]  CC:Bonnita Hollow, MD  Garner Nash, DO   REFERRING PHYSICIAN: Garner Nash, DO  DIAGNOSIS: 76 year old male with a solitary, 7 mm RUL lung metastasis secondary to his known adenocarcinoma of the rectum.    ICD-10-CM   1. Rectal adenocarcinoma metastatic to lung ([place])  C20    C78.00      HISTORY OF PRESENT ILLNESS: Jared White is a 76 y.o. male seen at the request of Dr. Valeta Harms.  He was initially diagnosed with T3 N2 M0 rectal cancer in 2021 and completed 7 cycles of neoadjuvant chemo under the care of Dr. Alen Blew followed by pelvic radiotherapy concurrent with oral Xeloda, under the care of Dr. Lisbeth Renshaw, completed in November 2021.  This was followed by a robotic low anterior resection on June 10, 2020, under the care of of Dr. Leighton Ruff.  He has been followed in active surveillance since that time and was able to have a loop ileostomy reversal in May 2022.  He was noted to have an anastomotic stenosis on follow-up imaging so he was taken back to the operating room with Dr. Marcello Moores on 07/09/2021 and surgical path from that procedure confirmed moderately differentiated adenocarcinoma, consistent with a recurrence at the anastomosis and he was scheduled for a diverting colostomy on 12/31/2021, under the care of Dr. Leighton Ruff.    On a recent CT chest angio performed on 11/23/2021 during hospital admission for work-up of upper abdominal/chest pain he was noted to have enlargement of a right upper lobe pulmonary nodule measuring 6 to 7 mm as compared to 5 mm in January 2023 and punctate prior to that.       This was further evaluated with robotic assisted bronchoscopy with biopsies of the RUL lung lesion under the care of Dr. Valeta Harms on 11/30/2021.  Final pathology confirmed metastatic adenocarcinoma consistent  with colon primary.  Fortunately, there was no evidence of any additional metastatic disease on his recent CT C/A/P so he was referred to [place] on 12/22/21, to discuss radiation treatment options for management of the oligometastatic lung lesion.  Dr. Hazeline Junker plan is to defer any further systemic therapy unless there is evidence of disease progression on future restaging scans.  Our recommendation was to proceed with a 3 fraction course of SBRT to the oligometastatic RUL lung lesion following recovery from the planned surgery on 12/31/21.  Since we saw him last, he has undergone diverting colostomy under the care of Dr. Marcello Moores as planned on 12/31/2021 and is recovering well. He presents today for a follow up visit to review the treatment recommendations and answer any additional questions regarding the treatment. His brother-in-law, Jill Poling, is in town from [place] and accompanies him for today's visit.   PREVIOUS RADIATION THERAPY: Yes  02/17/20 - 03/25/20: The rectum and regional nodes of the pelvis were treated to 45 Gy in 25 fractions, followed by a boost to the rectum only, with 5.4 Gy in 3 fractions of 1.8 Gy each   PAST MEDICAL HISTORY:  Past Medical History:  Diagnosis Date   Anemia    Arthritis    Blood transfusion without reported diagnosis    Broken back    CAD (coronary artery disease)    PCI & CABG   Cataract    "LIGHT"-RIGHT   CHF (congestive heart failure) (  [place])    Diverticulitis    mild - approx 2004   Dysrhythmia    occasional arrythmias   Family history of heart disease    GERD (gastroesophageal reflux disease)    Heart murmur    HX OF YEARS AGO    History of kidney stones    History of nuclear stress test 06/30/2011   lexiscan; normal pattern of perfusion post-stress; low risk scan    Hyperlipidemia    Hypertension    IgA nephropathy    Irregular heart beat    Myocardial infarction ([place])    Pre-diabetes    Rectal cancer ([place]) 08/19/2019   Systemic hypertension        PAST SURGICAL HISTORY: Past Surgical History:  Procedure Laterality Date   BRONCHIAL BIOPSY  11/30/2021   Procedure: BRONCHIAL BIOPSIES;  Surgeon: Garner Nash, DO;  Location: [facility];  Service: Pulmonary;;   BRONCHIAL NEEDLE ASPIRATION BIOPSY  11/30/2021   Procedure: BRONCHIAL NEEDLE ASPIRATION BIOPSIES;  Surgeon: Garner Nash, DO;  Location: [facility];  Service: Pulmonary;;   CARDIAC CATHETERIZATION  08/22/1995   PTCA of OM (Dr. Marella Chimes)   COLONOSCOPY     CORONARY ARTERY BYPASS GRAFT  01/22/2000   x5 - LIMA to LAD, SVG to diagonal, sequential SVG to ramus & OM, SVG to PDA (Dr. Tharon Aquas Trigt)   DIVERTING ILEOSTOMY N/A 06/10/2020   Procedure: DIVERTING LOOP ILEOSTOMY;  Surgeon: Leighton Ruff, MD;  Location: [facility];  Service: General;  Laterality: N/A;   FIDUCIAL MARKER PLACEMENT  11/30/2021   Procedure: FIDUCIAL MARKER PLACEMENT;  Surgeon: Garner Nash, DO;  Location: [facility];  Service: Pulmonary;;   ILEOSTOMY CLOSURE N/A 10/09/2020   Procedure: LOOP ILEOSTOMY REVERSAL;  Surgeon: Leighton Ruff, MD;  Location: [facility];  Service: General;  Laterality: N/A;   IR IMAGING GUIDED PORT INSERTION  09/04/2019   LAPAROSCOPIC DIVERTED COLOSTOMY N/A 12/31/2021   Procedure: LAPAROSCOPIC DIVERTING COLOSTOMY;  Surgeon: Leighton Ruff, MD;  Location: [facility];  Service: General;  Laterality: N/A;   LUNG LOBECTOMY     left upper lobe   RECTAL BIOPSY N/A 07/09/2021   Procedure: BIOPSY RECTAL;  Surgeon: Leighton Ruff, MD;  Location: [facility];  Service: General;  Laterality: N/A;   RECTAL EXAM UNDER ANESTHESIA N/A 07/09/2021   Procedure: ANAL RECTAL EXAM UNDER ANESTHESIA, RIGID PROCTOSCOPY;  Surgeon: Leighton Ruff, MD;  Location: [facility];  Service: General;  Laterality: N/A;   TRANSTHORACIC ECHOCARDIOGRAM  12/24/2012   EF 55-60%, mild LVH, mild conc hypertrophy; mild MR; LA mildly dilated   XI ROBOTIC ASSISTED LOWER ANTERIOR RESECTION N/A 06/10/2020   Procedure: XI ROBOTIC  ASSISTED LOWER ANTERIOR RESECTION, MOBILIZATION OF SPLENIC FLEXURE, RIGID PROCTOSCOPY;  Surgeon: Leighton Ruff, MD;  Location: [facility];  Service: General;  Laterality: N/A;    FAMILY HISTORY:  Family History  Problem Relation Age of Onset   Heart attack Father    Kidney failure Brother    Prostate cancer Brother    Heart attack Brother    Heart disease Brother    Hypertension Brother    Heart disease Brother    Hypertension Brother    Colon cancer Neg Hx    Esophageal cancer Neg Hx    Rectal cancer Neg Hx    Stomach cancer Neg Hx    Inflammatory bowel disease Neg Hx    Liver disease Neg Hx    Pancreatic cancer Neg Hx    Colon polyps Neg Hx  SOCIAL HISTORY:  Social History   Socioeconomic History   Marital status: Single    Spouse name: n/a   Number of children: 0   Years of education: Not on file   Highest education level: Not on file  Occupational History   Occupation: Chief Strategy Officer, [employer] representative  Tobacco Use   Smoking status: Never   Smokeless tobacco: Never  Vaping Use   Vaping Use: Never used  Substance and Sexual Activity   Alcohol use: Never   Drug use: No   Sexual activity: Not on file  Other Topics Concern   Not on file  Social History Narrative   Lives alone.   Sister-in-Law lives next door.   Social Determinants of Health   Financial Resource Strain: Low Risk  (01/22/2020)   Overall Financial Resource Strain (CARDIA)    Difficulty of Paying Living Expenses: Not hard at all  Food Insecurity: No Food Insecurity (01/22/2020)   Hunger Vital Sign    Worried About Running Out of Food in the Last Year: Never true    Ran Out of Food in the Last Year: Never true  Transportation Needs: No Transportation Needs (01/22/2020)   PRAPARE - Hydrologist (Medical): No    Lack of Transportation (Non-Medical): No  Physical Activity: Not on file  Stress: No Stress Concern Present (01/22/2020)   [place]    Feeling of Stress : Only a little  Social Connections: Socially Isolated (01/22/2020)   Social Connection and Isolation Panel [NHANES]    Frequency of Communication with Friends and Family: More than three times a week    Frequency of Social Gatherings with Friends and Family: More than three times a week    Attends Religious Services: Never    Marine scientist or Organizations: No    Attends Archivist Meetings: Never    Marital Status: Never married  Human resources officer Violence: Not on file    ALLERGIES: Patient has no known allergies.  MEDICATIONS:  Current Outpatient Medications  Medication Sig Dispense Refill   apixaban (ELIQUIS) 2.5 MG TABS tablet Take 1 tablet (2.5 mg total) by mouth 2 (two) times daily. (Patient not taking: Reported on 12/17/2021) 60 tablet 1   atenolol (TENORMIN) 50 MG tablet Take 1 tablet (50 mg total) by mouth daily. 90 tablet 3   Calcium Carbonate (CALCIUM 500 PO) Take 1,000 mg by mouth in the morning and at bedtime.     diltiazem (CARDIZEM CD) 120 MG 24 hr capsule Take 1 capsule (120 mg total) by mouth daily. 90 capsule 3   MAGNESIUM PO Take 250 mg by mouth daily.     nitroGLYCERIN (NITROSTAT) 0.4 MG SL tablet Place 0.4 mg under the tongue every 5 (five) minutes x 3 doses as needed for chest pain.     omeprazole (PRILOSEC) 40 MG capsule NEW PRESCRIPTION REQUEST: Omeprazole Dr 40 Mg Capsule - TAKE ONE CAPSULE BY MOUTH TWICE DAILY 180 capsule 0   oxyCODONE (OXY IR/ROXICODONE) 5 MG immediate release tablet Take 1 tablet (5 mg total) by mouth every 4 (four) hours as needed for moderate pain, severe pain or breakthrough pain. 15 tablet 0   rosuvastatin (CRESTOR) 20 MG tablet TAKE 1 TABLET BY MOUTH EVERY DAY 90 tablet 3   VITAMIN A PO Take by mouth.     VITAMIN D PO Take by mouth.     No current facility-administered medications for this encounter.  REVIEW OF SYSTEMS:  On review of systems, the patient reports  that he is doing well overall. he denies any chest pain, shortness of breath, cough, fevers, chills, night sweats, unintended weight changes. he denies any bowel or bladder disturbances, and denies abdominal pain, nausea or vomiting. he denies any new musculoskeletal or joint aches or pains. A complete review of systems is obtained and is otherwise negative.    PHYSICAL EXAM:  Wt Readings from Last 3 Encounters:  01/21/22 124 lb 12.8 oz (56.6 kg)  01/12/22 126 lb (57.2 kg)  12/31/21 120 lb (54.4 kg)   Temp Readings from Last 3 Encounters:  01/21/22 97.7 F (36.5 C)  01/12/22 98 F (36.7 C)  01/02/22 97.6 F (36.4 C) (Oral)   BP Readings from Last 3 Encounters:  01/21/22 (!) 147/64  01/12/22 (!) 145/70  01/02/22 (!) 114/52   Pulse Readings from Last 3 Encounters:  01/21/22 (!) 54  01/12/22 73  01/02/22 72   Pain Assessment Pain Score: 0-No pain/10  In general this is a well appearing Caucasian male in no acute distress.  He's alert and oriented x4 and appropriate throughout the examination. Cardiopulmonary assessment is negative for acute distress and he exhibits normal effort.   KPS = 100  100 - Normal; no complaints; no evidence of disease. 90   - Able to carry on normal activity; minor signs or symptoms of disease. 80   - Normal activity with effort; some signs or symptoms of disease. 14   - Cares for self; unable to carry on normal activity or to do active work. 60   - Requires occasional assistance, but is able to care for most of his personal needs. 50   - Requires considerable assistance and frequent medical care. 89   - Disabled; requires special care and assistance. 8   - Severely disabled; hospital admission is indicated although death not imminent. 42   - Very sick; hospital admission necessary; active supportive treatment necessary. 10   - Moribund; fatal processes progressing rapidly. 0     - Dead  Karnofsky DA, Abelmann [place], Craver LS and Burchenal [facility] [phone])  The use of the nitrogen mustards in the palliative treatment of carcinoma: with particular reference to bronchogenic carcinoma Cancer 1 634-56  LABORATORY DATA:  Lab Results  Component Value Date   WBC 8.4 01/02/2022   HGB 8.6 (L) 01/02/2022   HCT 26.7 (L) 01/02/2022   MCV 99.3 01/02/2022   PLT 152 01/02/2022   Lab Results  Component Value Date   NA 140 01/02/2022   K 3.7 01/02/2022   CL 111 01/02/2022   CO2 23 01/02/2022   Lab Results  Component Value Date   ALT 20 11/25/2021   AST 25 11/25/2021   ALKPHOS 62 11/25/2021   BILITOT 0.8 11/25/2021     RADIOGRAPHY: No results found.    IMPRESSION/PLAN: 1. 76 y.o. male with a solitary, 7 mm RUL lung metastasis secondary to his known adenocarcinoma of the rectum. Today, we met with the patient and family to review the findings and workup thus far as well as the natural history of oligometastatic adenocarcinoma of the rectum and general treatment, highlighting the role of radiotherapy in the management. We reviewed the available radiation techniques, and focused on the details and logistics of delivery.  The recommendation is for a 3 fraction course of stereotactic body radiotherapy (SBRT) to the solitary RUL lung nodule.  We reviewed the anticipated acute and late sequelae associated with radiation  in this setting. The patient was encouraged to ask questions that were answered to his stated satisfaction.  At the conclusion of our conversation, the patient would like to proceed with the recommended 3 fraction course of stereotactic body radiotherapy (SBRT) to the solitary RUL lung nodule.  He has freely signed consent to proceed today in the office and a copy of this document will be placed in his medical record.  We will share our discussion with Dr. Alen Blew and Dr. Valeta Harms and proceed with treatment planning accordingly.  He is scheduled for CT simulation/treatment planning following our visit this morning, in anticipation of beginning his  treatments in the very near future.  We enjoyed meeting with him again today and look forward to continuing to participate in his care.   We personally spent 45 minutes in this encounter including chart review, reviewing radiological studies, meeting face-to-face with the patient, entering orders and completing documentation.    Nicholos Johns, PA-C    Tyler Pita, MD  [facility] Direct Dial: [phone]  Fax: [phone] [email or web address]  Skype  LinkedIn

## 2022-01-21 NOTE — Progress Notes (Signed)
Follow-up-new appointment. I verified patient's identity and began nursing interview. Patient is doing well. No lung issues, discomfort, or SOB reported at this time.  Meaningful use complete.  BP (!) 147/64 (BP Location: Right Arm, Patient Position: Sitting, Cuff Size: Normal)   Pulse (!) 54   Temp 97.7 F (36.5 C)   Resp 20   Ht '5\' 8"'$  (1.727 m)   Wt 124 lb 12.8 oz (56.6 kg)   SpO2 100%   BMI 18.98 kg/m

## 2022-01-21 NOTE — Telephone Encounter (Signed)
Pt AWV scheduled for Thurs 9/14

## 2022-01-25 NOTE — Progress Notes (Signed)
  Radiation Oncology         (336) [phone] ________________________________  Name: Christan Defranco Dougan MRN: [id number]  Date: 01/21/2022  DOB: 06-18-1945  STEREOTACTIC BODY RADIOTHERAPY SIMULATION AND TREATMENT PLANNING NOTE    ICD-10-CM   1. Rectal adenocarcinoma metastatic to lung ([place])  C20    C78.00       DIAGNOSIS:  76 year old male with a solitary, 7 mm RUL lung metastasis secondary to his known adenocarcinoma of the rectum.  NARRATIVE:  The patient was brought to the [facility].  Identity was confirmed.  All relevant records and images related to the planned course of therapy were reviewed.  The patient freely provided informed written consent to proceed with treatment after reviewing the details related to the planned course of therapy. The consent form was witnessed and verified by the simulation staff.  Then, the patient was set-up in a stable reproducible  supine position for radiation therapy.  A BodyFix immobilization pillow was fabricated for reproducible positioning.  Then I personally applied the abdominal compression paddle to limit respiratory excursion.  4D respiratoy motion management CT images were obtained.  Surface markings were placed.  The CT images were loaded into the planning software.  Then, using Cine, MIP, and standard views, the internal target volume (ITV) and planning target volumes (PTV) were delinieated, and avoidance structures were contoured.  Treatment planning then occurred.  The radiation prescription was entered and confirmed.  A total of two complex treatment devices were fabricated in the form of the BodyFix immobilization pillow and a neck accuform cushion.  I have requested : 3D Simulation  I have requested a DVH of the following structures: Heart, Lungs, Esophagus, Chest Wall, Brachial Plexus, Major Blood Vessels, and targets.  SPECIAL TREATMENT PROCEDURE:  The planned course of therapy using radiation constitutes a special treatment  procedure. Special care is required in the management of this patient for the following reasons. This treatment constitutes a Special Treatment Procedure for the following reason: [ High dose per fraction requiring special monitoring for increased toxicities of treatment including daily imaging..  The special nature of the planned course of radiotherapy will require increased physician supervision and oversight to ensure patient's safety with optimal treatment outcomes.  This requires extended time and effort.    RESPIRATORY MOTION MANAGEMENT SIMULATION:  In order to account for effect of respiratory motion on target structures and other organs in the planning and delivery of radiotherapy, this patient underwent respiratory motion management simulation.  To accomplish this, when the patient was brought to the CT simulation planning suite, 4D respiratoy motion management CT images were obtained.  The CT images were loaded into the planning software.  Then, using a variety of tools including Cine, MIP, and standard views, the target volume and planning target volumes (PTV) were delineated.  Avoidance structures were contoured.  Treatment planning then occurred.  Dose volume histograms were generated and reviewed for each of the requested structure.  The resulting plan was carefully reviewed and approved today.  PLAN:  The patient will receive 54 Gy in 3 fractions.  ________________________________  Sheral Apley Tammi Klippel, M.D.

## 2022-01-26 NOTE — Telephone Encounter (Signed)
error 

## 2022-01-31 ENCOUNTER — Telehealth: Payer: Self-pay

## 2022-01-31 ENCOUNTER — Ambulatory Visit (INDEPENDENT_AMBULATORY_CARE_PROVIDER_SITE_OTHER): Payer: Medicare Other | Admitting: Family Medicine

## 2022-01-31 ENCOUNTER — Encounter: Payer: Self-pay | Admitting: Family Medicine

## 2022-01-31 VITALS — BP 122/78 | HR 69 | Temp 97.4°F | Wt 123.8 lb

## 2022-01-31 DIAGNOSIS — C7800 Secondary malignant neoplasm of unspecified lung: Secondary | ICD-10-CM

## 2022-01-31 DIAGNOSIS — K6289 Other specified diseases of anus and rectum: Secondary | ICD-10-CM

## 2022-01-31 DIAGNOSIS — G8929 Other chronic pain: Secondary | ICD-10-CM | POA: Insufficient documentation

## 2022-01-31 DIAGNOSIS — R42 Dizziness and giddiness: Secondary | ICD-10-CM | POA: Diagnosis not present

## 2022-01-31 DIAGNOSIS — C20 Malignant neoplasm of rectum: Secondary | ICD-10-CM

## 2022-01-31 DIAGNOSIS — Z510 Encounter for antineoplastic radiation therapy: Secondary | ICD-10-CM | POA: Diagnosis not present

## 2022-01-31 MED ORDER — FLUDROCORTISONE ACETATE 0.1 MG PO TABS: 0.1000 mg | ORAL_TABLET | Freq: Every day | ORAL | 0 refills | Status: DC

## 2022-01-31 MED ORDER — OXYCODONE HCL 5 MG PO TABS: 5.0000 mg | ORAL_TABLET | ORAL | 0 refills | Status: DC | PRN

## 2022-01-31 MED ORDER — MEDICAL COMPRESSION STOCKINGS MISC: 1.0000 | Freq: Every day | 0 refills | Status: DC

## 2022-01-31 NOTE — Assessment & Plan Note (Signed)
Overall unchanged since last visit, does say that he has taken less p.o. Weight is down 1 pound. Orthostatics are normal today Likely multifactorial including recent surgery, chronic malabsorption status post GI surgeries, metastatic colon cancer, chronic anemia, poor p.o. Also could be contributed by medication side effects including diltiazem and atenolol It may be possible that patient does have a possible orthostatic component, did discuss possible side effects from medications above, but did express hesitancy to change these given his history of atrial fibrillation, recommend patient reach out to cardiology to discuss this further Did encourage patient to continue p.o. supplementation, can also increase sodium intake, we will prescribe compression stockings and fludrocortisone  Appreciate hematology input regarding chronic anemia

## 2022-01-31 NOTE — Telephone Encounter (Signed)
Left patient a detailed voice message to return call to office.       Contacted patient per Dr. Grandville Silos "I have been thinking about patient and how to manage his care, especially his rectal pain. Given his ongoing rectal pain from cancer and because he is about to start radiation therapy again (would worsen his pain), I feel that he may be better served with palliative care. I still want to be his primary care, and I can continue to help provide him general care. And I am ok to continue his pain control, but palliative care is much more suited for this. If patient is willing, I would like to refer patient if he is willing to go. "

## 2022-01-31 NOTE — Assessment & Plan Note (Signed)
Associated with rectal surgeries from metastatic colon cancer Pain has been improved with oxycodone, patient does need refill We will refill oxycodone, however given complexity of patient's history, I believe palliative care may be helpful Oxycodone and palliative care referral placed

## 2022-01-31 NOTE — Progress Notes (Signed)
Assessment/Plan:   Problem List Items Addressed This Visit       Respiratory   Rectal adenocarcinoma metastatic to lung (HCC)   Relevant Medications   oxyCODONE (OXY IR/ROXICODONE) 5 MG immediate release tablet   Other Relevant Orders   ToxASSURE Select 13 (MW), Urine     Other   Dizziness - Primary    Overall unchanged since last visit, does say that he has taken less p.o. Weight is down 1 pound. Orthostatics are normal today Likely multifactorial including recent surgery, chronic malabsorption status post GI surgeries, metastatic colon cancer, chronic anemia, poor p.o. Also could be contributed by medication side effects including diltiazem and atenolol It may be possible that patient does have a possible orthostatic component, did discuss possible side effects from medications above, but did express hesitancy to change these given his history of atrial fibrillation, recommend patient reach out to cardiology to discuss this further Did encourage patient to continue p.o. supplementation, can also increase sodium intake, we will prescribe compression stockings and fludrocortisone  Appreciate hematology input regarding chronic anemia        Relevant Medications   fludrocortisone (FLORINEF) 0.1 MG tablet   Elastic Bandages & Supports (MEDICAL COMPRESSION STOCKINGS) MISC   Rectal pain, chronic    Associated with rectal surgeries from metastatic colon cancer Pain has been improved with oxycodone, patient does need refill We will refill oxycodone, however given complexity of patient's history, I believe palliative care may be helpful Oxycodone and palliative care referral placed      Relevant Medications   oxyCODONE (OXY IR/ROXICODONE) 5 MG immediate release tablet   fludrocortisone (FLORINEF) 0.1 MG tablet   Other Relevant Orders   ToxASSURE Select 13 (MW), Urine   Amb Referral to Palliative Care       Subjective:  HPI:  Jared White is a 76 y.o. male who has Multiple  bilateral rib fractures; Multiple thoracic/lumbar transverse process fractures; Fall from roof; CAD (coronary artery disease); GERD (gastroesophageal reflux disease); Essential hypertension; Hyperlipidemia; S/P CABG x 4; Psoriasis; Hypocalcemia; Facial numbness; Hypomagnesemia; Microalbuminuria; Stage 3b chronic kidney disease ([place]); Coronary artery disease involving native coronary artery of native heart without angina pectoris; Abnormal MRI, pelvis; Abnormal CT scan, pelvis; Rectal bleeding; Hemorrhoids; Constipation; Rectal adenocarcinoma metastatic to lung [facility]); Port-A-Cath in place; Weight loss; Gastric polyps; History of rectal cancer; History of colonic polyps; Barrett's esophagus without dysplasia; Preop cardiovascular exam; AKI (acute kidney injury) ([place]); Ileostomy in place [facility]); Pre-op exam; Acute renal failure superimposed on stage 3b chronic kidney disease ([place]); Hydroureteronephrosis; Lumbar back pain; Elevated troponin; Protein-calorie malnutrition, severe; PAF (paroxysmal atrial fibrillation) ([place]); Lung nodule; Dizziness; Rectal cancer metastasized to lung [facility]); and Rectal pain, chronic on their problem list..   He  has a past medical history of Anemia, Arthritis, Blood transfusion without reported diagnosis, Broken back, CAD (coronary artery disease), Cataract, CHF (congestive heart failure) ([place]), Diverticulitis, Dysrhythmia, Family history of heart disease, GERD (gastroesophageal reflux disease), Heart murmur, History of kidney stones, History of nuclear stress test (06/30/2011), Hyperlipidemia, Hypertension, IgA nephropathy, Irregular heart beat, Myocardial infarction ([place]), Pre-diabetes, Rectal cancer ([place]) (08/19/2019), and Systemic hypertension.Marland Kitchen   He presents with chief complaint of Follow-up (6 week f/u dizziness. Patient is still experiencing dizziness whenever he walks) .     Assessment/Plan:  Spent 30 minutes with patient reviewing patient history and discussing treatment  options Problem List Items Addressed This Visit       Respiratory   Rectal adenocarcinoma metastatic to lung ([place])  Relevant Medications   oxyCODONE (OXY IR/ROXICODONE) 5 MG immediate release tablet   Other Relevant Orders   ToxASSURE Select 13 (MW), Urine     Other   Dizziness - Primary    Overall unchanged since last visit, does say that he has taken less p.o. Weight is down 1 pound. Orthostatics are normal today Likely multifactorial including recent surgery, chronic malabsorption status post GI surgeries, metastatic colon cancer, chronic anemia, poor p.o. Also could be contributed by medication side effects including diltiazem and atenolol It may be possible that patient does have a possible orthostatic component, did discuss possible side effects from medications above, but did express hesitancy to change these given his history of atrial fibrillation, recommend patient reach out to cardiology to discuss this further Did encourage patient to continue p.o. supplementation, can also increase sodium intake, we will prescribe compression stockings and fludrocortisone  Appreciate hematology input regarding chronic anemia        Relevant Medications   fludrocortisone (FLORINEF) 0.1 MG tablet   Elastic Bandages & Supports (MEDICAL COMPRESSION STOCKINGS) MISC   Rectal pain, chronic    Associated with rectal surgeries from metastatic colon cancer Pain has been improved with oxycodone, patient does need refill We will refill oxycodone, however given complexity of patient's history, I believe palliative care may be helpful Oxycodone and palliative care referral placed      Relevant Medications   oxyCODONE (OXY IR/ROXICODONE) 5 MG immediate release tablet   fludrocortisone (FLORINEF) 0.1 MG tablet   Other Relevant Orders   ToxASSURE Select 13 (MW), Urine   Amb Referral to Palliative Care       Subjective:  HPI:  Jared White is a 76 y.o. male who has Multiple bilateral rib  fractures; Multiple thoracic/lumbar transverse process fractures; Fall from roof; CAD (coronary artery disease); GERD (gastroesophageal reflux disease); Essential hypertension; Hyperlipidemia; S/P CABG x 4; Psoriasis; Hypocalcemia; Facial numbness; Hypomagnesemia; Microalbuminuria; Stage 3b chronic kidney disease ([place]); Coronary artery disease involving native coronary artery of native heart without angina pectoris; Abnormal MRI, pelvis; Abnormal CT scan, pelvis; Rectal bleeding; Hemorrhoids; Constipation; Rectal adenocarcinoma metastatic to lung [facility]); Port-A-Cath in place; Weight loss; Gastric polyps; History of rectal cancer; History of colonic polyps; Barrett's esophagus without dysplasia; Preop cardiovascular exam; AKI (acute kidney injury) ([place]); Ileostomy in place [facility]); Pre-op exam; Acute renal failure superimposed on stage 3b chronic kidney disease ([place]); Hydroureteronephrosis; Lumbar back pain; Elevated troponin; Protein-calorie malnutrition, severe; PAF (paroxysmal atrial fibrillation) ([place]); Lung nodule; Dizziness; Rectal cancer metastasized to lung [facility]); and Rectal pain, chronic on their problem list..   He  has a past medical history of Anemia, Arthritis, Blood transfusion without reported diagnosis, Broken back, CAD (coronary artery disease), Cataract, CHF (congestive heart failure) ([place]), Diverticulitis, Dysrhythmia, Family history of heart disease, GERD (gastroesophageal reflux disease), Heart murmur, History of kidney stones, History of nuclear stress test (06/30/2011), Hyperlipidemia, Hypertension, IgA nephropathy, Irregular heart beat, Myocardial infarction ([place]), Pre-diabetes, Rectal cancer ([place]) (08/19/2019), and Systemic hypertension.Marland Kitchen   He presents with chief complaint of Follow-up (6 week f/u dizziness. Patient is still experiencing dizziness whenever he walks) .   01/31/22 Update Patient had laparoscopic diverting colonoscopy due to complications associated with stage IV rectal  cancer.  Reports that he did well with the surgery.   Regarding rectal cancer, does follow with GI surgery and medical oncology.  He is slated to restart radiation in the upcoming weeks.  Reports ongoing rectal pain.  This is has been treated with oxycodone.  As  needed.  Patient, here to follow-up on dizziness.  Says it occurs virtually every time he stands.  He says he just feels "weak".  And then he just feels "lightheaded" when standing.  Patient states that he is not eating as well and does not drink no supplement shakes.  There is no associated loss of consciousness, shortness of breath, chest pain.  Labs obtained since surgery, showed CBC worsening hemoglobin around 8.6, CMP notable for chronic renal dysfunction with creatinine around 1.5, low calcium near baseline 7, elevated glucose around 118.  Patient did have CT of head 0720/23 that did not show any acute change, did show some chronic microvascular disease and no metastatic lesions.    12/20/2021 Patient with complex medical history with colon cancer.  Status post multiple abdominal surgeries.  He does have metastatic nodule to the lung.  He has had colectomy and prior ileostomy.  He has an upcoming colonic surgery to put in a permanent ileostomy.  Regarding colon cancer, he follows with GI surgery and medical oncology.  Regarding lung metastasis, he follows with pulmonology as well.  Due to his prior colonic resection, he has had chronic diarrhea and malabsorption.  Recently hospitalized in 11/2021, found to have multiple electrolyte abnormalities.  Since his discharge, he reports he has had improvement in diarrhea.  Patient has gained approximately 5 pounds over the past 2 weeks.  Last BMP 2 weeks ago showed mild hypocalcemia at 8.5, mild hyponatremia at 130, decreased renal function with GFR around 45. Patient does work with home PT 3 times a week.  Reports that this is going well.   Has a history of coronary artery disease  status post CABG.  Does follow with cardiology.  Recently seen earlier this month.  They did reduce his Eliquis based on weight and have modified it to discontinue right before surgery.  Then resume after.   Dizziness  He reports new onset dizziness. He describes it as feeling light headed, occurs intermittently, and typically lasts a few seconds.  It typically occurs when he is standing up from siting position. It is usually relieved by sitting still and rest. He has not started new medications around the time the dizziness started.   Associated symptoms: No hearing loss No tinnitus  No chest discomfort No heart palpitations  No heart racing No numbness or tingling of extremities  No nausea No vomiting  No speech difficulty No visual changes    Wt Readings from Last 3 Encounters:  01/31/22 123 lb 12.8 oz (56.2 kg)  01/21/22 124 lb 12.8 oz (56.6 kg)  01/12/22 126 lb (57.2 kg)    BP Readings from Last 3 Encounters:  01/31/22 122/78  01/21/22 (!) 147/64  01/12/22 (!) 145/70      Lab Results  Component Value Date   WBC 8.4 01/02/2022   HGB 8.6 (L) 01/02/2022   HCT 26.7 (L) 01/02/2022   MCV 99.3 01/02/2022   PLT 152 01/02/2022   Lab Results  Component Value Date   NA 140 01/02/2022   K 3.7 01/02/2022   CO2 23 01/02/2022   BUN 25 (H) 01/02/2022   CREATININE 1.55 (H) 01/02/2022   CALCIUM 7.0 (L) 01/02/2022   GLUCOSE 118 (H) 01/02/2022     ---------------------------------------------------------------------------------------------------  Past Surgical History:  Procedure Laterality Date   BRONCHIAL BIOPSY  11/30/2021   Procedure: BRONCHIAL BIOPSIES;  Surgeon: Garner Nash, DO;  Location: [facility];  Service: Pulmonary;;   BRONCHIAL NEEDLE ASPIRATION BIOPSY  11/30/2021  Procedure: BRONCHIAL NEEDLE ASPIRATION BIOPSIES;  Surgeon: Garner Nash, DO;  Location: [facility];  Service: Pulmonary;;   CARDIAC CATHETERIZATION  08/22/1995   PTCA of OM (Dr. Marella Chimes)    COLONOSCOPY     CORONARY ARTERY BYPASS GRAFT  01/22/2000   x5 - LIMA to LAD, SVG to diagonal, sequential SVG to ramus & OM, SVG to PDA (Dr. Tharon Aquas Trigt)   DIVERTING ILEOSTOMY N/A 06/10/2020   Procedure: DIVERTING LOOP ILEOSTOMY;  Surgeon: Leighton Ruff, MD;  [facility];  Service: General;  Laterality: N/A;   FIDUCIAL MARKER PLACEMENT  11/30/2021   Procedure: FIDUCIAL MARKER PLACEMENT;  Surgeon: Garner Nash, DO;  Location: [facility];  Service: Pulmonary;;   ILEOSTOMY CLOSURE N/A 10/09/2020   Procedure: LOOP ILEOSTOMY REVERSAL;  Surgeon: Leighton Ruff, MD;  [facility];  Service: General;  Laterality: N/A;   IR IMAGING GUIDED PORT INSERTION  09/04/2019   LAPAROSCOPIC DIVERTED COLOSTOMY N/A 12/31/2021   Procedure: LAPAROSCOPIC DIVERTING COLOSTOMY;  Surgeon: Leighton Ruff, MD;  [facility];  Service: General;  Laterality: N/A;   LUNG LOBECTOMY     left upper lobe   RECTAL BIOPSY N/A 07/09/2021   Procedure: BIOPSY RECTAL;  Surgeon: Leighton Ruff, MD;  [facility];  Service: General;  Laterality: N/A;   RECTAL EXAM UNDER ANESTHESIA N/A 07/09/2021   Procedure: ANAL RECTAL EXAM UNDER ANESTHESIA, RIGID PROCTOSCOPY;  Surgeon: Leighton Ruff, MD;  [facility];  Service: General;  Laterality: N/A;   TRANSTHORACIC ECHOCARDIOGRAM  12/24/2012   EF 55-60%, mild LVH, mild conc hypertrophy; mild MR; LA mildly dilated   XI ROBOTIC ASSISTED LOWER ANTERIOR RESECTION N/A 06/10/2020   Procedure: XI ROBOTIC ASSISTED LOWER ANTERIOR RESECTION, MOBILIZATION OF SPLENIC FLEXURE, RIGID PROCTOSCOPY;  Surgeon: Leighton Ruff, MD;  [facility];  Service: General;  Laterality: N/A;    Outpatient Medications Prior to Visit  Medication Sig Dispense Refill   atenolol (TENORMIN) 50 MG tablet Take 1 tablet (50 mg total) by mouth daily. 90 tablet 3   Calcium Carbonate (CALCIUM 500 PO) Take 1,000 mg by mouth in the morning and at bedtime.     cyanocobalamin (VITAMIN B12) 1000 MCG  tablet Take by mouth.     diltiazem (CARDIZEM CD) 120 MG 24 hr capsule Take 1 capsule (120 mg total) by mouth daily. 90 capsule 3   MAGNESIUM PO Take 250 mg by mouth daily.     nitroGLYCERIN (NITROSTAT) 0.4 MG SL tablet Place 0.4 mg under the tongue every 5 (five) minutes x 3 doses as needed for chest pain.     omeprazole (PRILOSEC) 40 MG capsule NEW PRESCRIPTION REQUEST: Omeprazole Dr 40 Mg Capsule - TAKE ONE CAPSULE BY MOUTH TWICE DAILY 180 capsule 0   rosuvastatin (CRESTOR) 20 MG tablet TAKE 1 TABLET BY MOUTH EVERY DAY 90 tablet 3   VITAMIN A PO Take by mouth.     VITAMIN D PO Take by mouth.     oxyCODONE (OXY IR/ROXICODONE) 5 MG immediate release tablet Take 1 tablet (5 mg total) by mouth every 4 (four) hours as needed for moderate pain, severe pain or breakthrough pain. 15 tablet 0   apixaban (ELIQUIS) 2.5 MG TABS tablet Take 1 tablet (2.5 mg total) by mouth 2 (two) times daily. (Patient not taking: Reported on 12/17/2021) 60 tablet 1   No facility-administered medications prior to visit.    Family History  Problem Relation Age of Onset   Heart attack Father    Kidney failure  Brother    Prostate cancer Brother    Heart attack Brother    Heart disease Brother    Hypertension Brother    Heart disease Brother    Hypertension Brother    Colon cancer Neg Hx    Esophageal cancer Neg Hx    Rectal cancer Neg Hx    Stomach cancer Neg Hx    Inflammatory bowel disease Neg Hx    Liver disease Neg Hx    Pancreatic cancer Neg Hx    Colon polyps Neg Hx     Social History   Socioeconomic History   Marital status: Single    Spouse name: n/a   Number of children: 0   Years of education: Not on file   Highest education level: Not on file  Occupational History   Occupation: Chief Strategy Officer, [employer] representative  Tobacco Use   Smoking status: Never   Smokeless tobacco: Never  Vaping Use   Vaping Use: Never used  Substance and Sexual Activity   Alcohol use: Never   Drug use: No   Sexual  activity: Not on file  Other Topics Concern   Not on file  Social History Narrative   Lives alone.   Sister-in-Law lives next door.   Social Determinants of Health   Financial Resource Strain: Low Risk  (01/22/2020)   Overall Financial Resource Strain (CARDIA)    Difficulty of Paying Living Expenses: Not hard at all  Food Insecurity: No Food Insecurity (01/22/2020)   Hunger Vital Sign    Worried About Running Out of Food in the Last Year: Never true    Ran Out of Food in the Last Year: Never true  Transportation Needs: No Transportation Needs (01/22/2020)   PRAPARE - Hydrologist (Medical): No    Lack of Transportation (Non-Medical): No  Physical Activity: Not on file  Stress: No Stress Concern Present (01/22/2020)   [place]    Feeling of Stress : Only a little  Social Connections: Socially Isolated (01/22/2020)   Social Connection and Isolation Panel [NHANES]    Frequency of Communication with Friends and Family: More than three times a week    Frequency of Social Gatherings with Friends and Family: More than three times a week    Attends Religious Services: Never    Marine scientist or Organizations: No    Attends Music therapist: Never    Marital Status: Never married  Human resources officer Violence: Not on file                                                                                                 Objective:  Physical Exam: BP 122/78 (BP Location: Left Arm, Patient Position: Sitting, Cuff Size: Large)   Pulse 69   Temp (!) 97.4 F (36.3 C) (Temporal)   Wt 123 lb 12.8 oz (56.2 kg)   SpO2 98%   BMI 18.82 kg/m    General: No acute distress. Awake and conversant.  Eyes: Normal conjunctiva, anicteric. Round symmetric pupils.  ENT: Hearing grossly  intact. No nasal discharge.  Neck: Neck is supple. No masses or thyromegaly.  Respiratory: Respirations are  non-labored. No auditory wheezing.  CTAB Skin: Warm. No rashes or ulcers.  Psych: Alert and oriented. Cooperative, Appropriate mood and affect, Normal judgment.  CV: No cyanosis or JVD, normal S1-S2, no murmurs rubs or gallops appreciated MSK:  No clubbing  Neuro: Sensation and CN II-XII grossly normal.        Alesia Banda, MD, MS  Past Surgical History:  Procedure Laterality Date   BRONCHIAL BIOPSY  11/30/2021   Procedure: BRONCHIAL BIOPSIES;  Surgeon: Garner Nash, DO;  Location: [facility];  Service: Pulmonary;;   BRONCHIAL NEEDLE ASPIRATION BIOPSY  11/30/2021   Procedure: BRONCHIAL NEEDLE ASPIRATION BIOPSIES;  Surgeon: Garner Nash, DO;  Location: [facility];  Service: Pulmonary;;   CARDIAC CATHETERIZATION  08/22/1995   PTCA of OM (Dr. Marella Chimes)   COLONOSCOPY     CORONARY ARTERY BYPASS GRAFT  01/22/2000   x5 - LIMA to LAD, SVG to diagonal, sequential SVG to ramus & OM, SVG to PDA (Dr. Tharon Aquas Trigt)   DIVERTING ILEOSTOMY N/A 06/10/2020   Procedure: DIVERTING LOOP ILEOSTOMY;  Surgeon: Leighton Ruff, MD;  [facility];  Service: General;  Laterality: N/A;   FIDUCIAL MARKER PLACEMENT  11/30/2021   Procedure: FIDUCIAL MARKER PLACEMENT;  Surgeon: Garner Nash, DO;  Location: [facility];  Service: Pulmonary;;   ILEOSTOMY CLOSURE N/A 10/09/2020   Procedure: LOOP ILEOSTOMY REVERSAL;  Surgeon: Leighton Ruff, MD;  [facility];  Service: General;  Laterality: N/A;   IR IMAGING GUIDED PORT INSERTION  09/04/2019   LAPAROSCOPIC DIVERTED COLOSTOMY N/A 12/31/2021   Procedure: LAPAROSCOPIC DIVERTING COLOSTOMY;  Surgeon: Leighton Ruff, MD;  [facility];  Service: General;  Laterality: N/A;   LUNG LOBECTOMY     left upper lobe   RECTAL BIOPSY N/A 07/09/2021   Procedure: BIOPSY RECTAL;  Surgeon: Leighton Ruff, MD;  [facility];  Service: General;  Laterality: N/A;   RECTAL EXAM UNDER ANESTHESIA N/A 07/09/2021   Procedure: ANAL RECTAL EXAM UNDER  ANESTHESIA, RIGID PROCTOSCOPY;  Surgeon: Leighton Ruff, MD;  [facility];  Service: General;  Laterality: N/A;   TRANSTHORACIC ECHOCARDIOGRAM  12/24/2012   EF 55-60%, mild LVH, mild conc hypertrophy; mild MR; LA mildly dilated   XI ROBOTIC ASSISTED LOWER ANTERIOR RESECTION N/A 06/10/2020   Procedure: XI ROBOTIC ASSISTED LOWER ANTERIOR RESECTION, MOBILIZATION OF SPLENIC FLEXURE, RIGID PROCTOSCOPY;  Surgeon: Leighton Ruff, MD;  [facility];  Service: General;  Laterality: N/A;    Outpatient Medications Prior to Visit  Medication Sig Dispense Refill   atenolol (TENORMIN) 50 MG tablet Take 1 tablet (50 mg total) by mouth daily. 90 tablet 3   Calcium Carbonate (CALCIUM 500 PO) Take 1,000 mg by mouth in the morning and at bedtime.     cyanocobalamin (VITAMIN B12) 1000 MCG tablet Take by mouth.     diltiazem (CARDIZEM CD) 120 MG 24 hr capsule Take 1 capsule (120 mg total) by mouth daily. 90 capsule 3   MAGNESIUM PO Take 250 mg by mouth daily.     nitroGLYCERIN (NITROSTAT) 0.4 MG SL tablet Place 0.4 mg under the tongue every 5 (five) minutes x 3 doses as needed for chest pain.     omeprazole (PRILOSEC) 40 MG capsule NEW PRESCRIPTION REQUEST: Omeprazole Dr 40 Mg Capsule - TAKE ONE CAPSULE BY MOUTH TWICE DAILY 180 capsule 0   rosuvastatin (CRESTOR) 20 MG tablet TAKE 1  TABLET BY MOUTH EVERY DAY 90 tablet 3   VITAMIN A PO Take by mouth.     VITAMIN D PO Take by mouth.     oxyCODONE (OXY IR/ROXICODONE) 5 MG immediate release tablet Take 1 tablet (5 mg total) by mouth every 4 (four) hours as needed for moderate pain, severe pain or breakthrough pain. 15 tablet 0   apixaban (ELIQUIS) 2.5 MG TABS tablet Take 1 tablet (2.5 mg total) by mouth 2 (two) times daily. (Patient not taking: Reported on 12/17/2021) 60 tablet 1   No facility-administered medications prior to visit.    Family History  Problem Relation Age of Onset   Heart attack Father    Kidney failure Brother    Prostate cancer Brother     Heart attack Brother    Heart disease Brother    Hypertension Brother    Heart disease Brother    Hypertension Brother    Colon cancer Neg Hx    Esophageal cancer Neg Hx    Rectal cancer Neg Hx    Stomach cancer Neg Hx    Inflammatory bowel disease Neg Hx    Liver disease Neg Hx    Pancreatic cancer Neg Hx    Colon polyps Neg Hx     Social History   Socioeconomic History   Marital status: Single    Spouse name: n/a   Number of children: 0   Years of education: Not on file   Highest education level: Not on file  Occupational History   Occupation: Chief Strategy Officer, [employer] representative  Tobacco Use   Smoking status: Never   Smokeless tobacco: Never  Vaping Use   Vaping Use: Never used  Substance and Sexual Activity   Alcohol use: Never   Drug use: No   Sexual activity: Not on file  Other Topics Concern   Not on file  Social History Narrative   Lives alone.   Sister-in-Law lives next door.   Social Determinants of Health   Financial Resource Strain: Low Risk  (01/22/2020)   Overall Financial Resource Strain (CARDIA)    Difficulty of Paying Living Expenses: Not hard at all  Food Insecurity: No Food Insecurity (01/22/2020)   Hunger Vital Sign    Worried About Running Out of Food in the Last Year: Never true    Ran Out of Food in the Last Year: Never true  Transportation Needs: No Transportation Needs (01/22/2020)   PRAPARE - Hydrologist (Medical): No    Lack of Transportation (Non-Medical): No  Physical Activity: Not on file  Stress: No Stress Concern Present (01/22/2020)   [place]    Feeling of Stress : Only a little  Social Connections: Socially Isolated (01/22/2020)   Social Connection and Isolation Panel [NHANES]    Frequency of Communication with Friends and Family: More than three times a week    Frequency of Social Gatherings with Friends and Family: More than three times  a week    Attends Religious Services: Never    Marine scientist or Organizations: No    Attends Archivist Meetings: Never    Marital Status: Never married  Intimate Partner Violence: Not on file  Objective:  Physical Exam: BP 122/78 (BP Location: Left Arm, Patient Position: Sitting, Cuff Size: Large)   Pulse 69   Temp (!) 97.4 F (36.3 C) (Temporal)   Wt 123 lb 12.8 oz (56.2 kg)   SpO2 98%   BMI 18.82 kg/m    General: No acute distress. Awake and conversant.  Eyes: Normal conjunctiva, anicteric. Round symmetric pupils.  ENT: Hearing grossly intact. No nasal discharge.  Neck: Neck is supple. No masses or thyromegaly.  Respiratory: Respirations are non-labored. No auditory wheezing.  Skin: Warm. No rashes or ulcers.  Psych: Alert and oriented. Cooperative, Appropriate mood and affect, Normal judgment.  CV: No cyanosis or JVD MSK: Normal ambulation. No clubbing  Neuro: Sensation and CN II-XII grossly normal.   Orthostatic VS for the past 72 hrs (Last 3 readings):  Orthostatic BP Patient Position BP Location Cuff Size Orthostatic Pulse  01/31/22 1444 114/68 Supine Left Arm Large 64  01/31/22 1442 116/80 Standing Left Arm Large 68  01/31/22 1440 122/84 Sitting Left Arm Large 75        Alesia Banda, MD, MS

## 2022-01-31 NOTE — Telephone Encounter (Signed)
Spoke with patient and advised him of annotation below and he verbalized understanding. Agrees with palliative care.      ----------------------------------------------------------------------------------- (Newest Message First) January 31, 2022 Me    01/31/22  3:53 PM Note   Left patient a detailed voice message to return call to office.            Contacted patient per Dr. Grandville Silos "I have been thinking about patient and how to manage his care, especially his rectal pain. Given his ongoing rectal pain from cancer and because he is about to start radiation therapy again (would worsen his pain), I feel that he may be better served with palliative care. I still want to be his primary care, and I can continue to help provide him general care. And I am ok to continue his pain control, but palliative care is much more suited for this. If patient is willing, I would like to refer patient if he is willing to go. "           01/31/22  3:52 PM  You attempted to contact Jared White, Jared White (Left Message)

## 2022-01-31 NOTE — Patient Instructions (Signed)
For anal pain, use oxycodone For dizziness, we are ordering compression stockings. A dme company will send you these. You can also increase sodium intake with food. Also, start Fludrocortisone as prescribed

## 2022-02-02 ENCOUNTER — Telehealth: Payer: Self-pay

## 2022-02-02 ENCOUNTER — Encounter: Payer: Self-pay | Admitting: Oncology

## 2022-02-02 NOTE — Telephone Encounter (Signed)
Spoke with patient and scheduled a Mychart Palliative Consult for 02/07/22 @ 12:30 PM.  Consent obtained; updated Netsmart, Team List and Epic.

## 2022-02-03 ENCOUNTER — Ambulatory Visit (INDEPENDENT_AMBULATORY_CARE_PROVIDER_SITE_OTHER): Payer: Medicare Other

## 2022-02-03 ENCOUNTER — Other Ambulatory Visit: Payer: Self-pay

## 2022-02-03 ENCOUNTER — Ambulatory Visit
Admission: RE | Admit: 2022-02-03 | Discharge: 2022-02-03 | Disposition: A | Discharge status: Home / Self Care | Payer: Medicare Other | Source: Ambulatory Visit | Attending: Radiation Oncology | Admitting: Radiation Oncology

## 2022-02-03 DIAGNOSIS — Z510 Encounter for antineoplastic radiation therapy: Secondary | ICD-10-CM | POA: Diagnosis not present

## 2022-02-03 DIAGNOSIS — I480 Paroxysmal atrial fibrillation: Secondary | ICD-10-CM

## 2022-02-03 DIAGNOSIS — C20 Malignant neoplasm of rectum: Secondary | ICD-10-CM

## 2022-02-03 DIAGNOSIS — Z0000 Encounter for general adult medical examination without abnormal findings: Secondary | ICD-10-CM

## 2022-02-03 LAB — RAD ONC ARIA SESSION SUMMARY
Course Elapsed Days: 0
Plan Fractions Treated to Date: 1
Plan Prescribed Dose Per Fraction: 18 Gy
Plan Total Fractions Prescribed: 3
Plan Total Prescribed Dose: 54 Gy
Reference Point Dosage Given to Date: 18 Gy
Reference Point Session Dosage Given: 18 Gy
Session Number: 1

## 2022-02-03 LAB — TOXASSURE SELECT 13 (MW), URINE

## 2022-02-03 NOTE — Progress Notes (Unsigned)
I connected with  Jared White on 02/04/22 by a video enabled telemedicine application and verified that I am speaking with the correct person using two identifiers.   I discussed the limitations of evaluation and management by telemedicine. The patient expressed understanding and agreed to proceed.   [name] , Thank you for taking time to come for your Medicare Wellness Visit. I appreciate your ongoing commitment to your health goals. Please review the following plan we discussed and let me know if I can assist you in the future.   These are the goals we discussed:  Goals   None     This is a list of the screening recommended for you and due dates:  Health Maintenance  Topic Date Due   Hepatitis C Screening: USPSTF Recommendation to screen - Ages 19-79 yo.  Never done   Flu Shot  12/21/2021   COVID-19 Vaccine (4 - Pfizer risk series) 05/22/2022*   Colon Cancer Screening  06/08/2022   Tetanus Vaccine  01/10/2029   Pneumonia Vaccine  Completed   Zoster (Shingles) Vaccine  Completed   HPV Vaccine  Aged Out  *Topic was postponed. The date shown is not the original due date.    Nutrition Risk Assessment:  Has the patient had any N/V/D within the last 2 months?  Yes  Does the patient have any non-healing wounds?  No  Has the patient had any unintentional weight loss or weight gain?  Yes      Subjective:   Jared White is a 76 y.o. male who presents for an Initial Medicare Annual Wellness Visit.  Review of Systems     Cardiac Risk Factors include: advanced age (>50mn, >>29women);dyslipidemia;male gender     Objective:    There were no vitals filed for this visit. There is no height or weight on file to calculate BMI.     02/03/2022    3:52 PM 01/21/2022    8:54 AM 12/31/2021    6:12 AM 12/22/2021   12:41 PM 12/22/2021    9:11 AM 11/22/2021    6:26 PM 11/21/2021    4:17 PM  Advanced Directives  Does Patient Have a Medical Advance Directive? Yes Yes No;Yes Yes Yes  No  Type  of AParamed[place]of [place] will Living will;Healthcare Power of [place]   Does patient want to make changes to medical advance directive? No - Patient declined  No - Patient declined      Copy of [place]?   No - copy requested      Would patient like information on creating a medical advance directive?   No - Patient declined   No - Patient declined     Current Medications (verified) Outpatient Encounter Medications as of 02/03/2022  Medication Sig   apixaban (ELIQUIS) 2.5 MG TABS tablet Take 2.5 mg by mouth 2 (two) times daily. Pt approved to stop taking when having rectal bleeding. Currently not taking.   atenolol (TENORMIN) 50 MG tablet Take 1 tablet (50 mg total) by mouth daily.   Calcium Carbonate (CALCIUM 500 PO) Take 1,000 mg by mouth in the morning and at bedtime.   cyanocobalamin (VITAMIN B12) 1000 MCG tablet Take by mouth.   diltiazem (CARDIZEM CD) 120 MG 24 hr capsule Take 1 capsule (120 mg total) by mouth daily.   fludrocortisone (FLORINEF) 0.1 MG tablet Take  1 tablet (0.1 mg total) by mouth daily.   MAGNESIUM PO Take 250 mg by mouth daily.   nitroGLYCERIN (NITROSTAT) 0.4 MG SL tablet Place 0.4 mg under the tongue every 5 (five) minutes x 3 doses as needed for chest pain.   omeprazole (PRILOSEC) 40 MG capsule NEW PRESCRIPTION REQUEST: Omeprazole Dr 40 Mg Capsule - TAKE ONE CAPSULE BY MOUTH TWICE DAILY   oxyCODONE (OXY IR/ROXICODONE) 5 MG immediate release tablet Take 1 tablet (5 mg total) by mouth every 4 (four) hours as needed for moderate pain, severe pain or breakthrough pain.   rosuvastatin (CRESTOR) 20 MG tablet TAKE 1 TABLET BY MOUTH EVERY DAY   VITAMIN A PO Take by mouth.   VITAMIN D PO Take by mouth.   [DISCONTINUED] apixaban (ELIQUIS) 2.5 MG TABS tablet Take 1 tablet (2.5 mg total) by mouth 2 (two) times  daily. (Patient not taking: Reported on 12/17/2021)   [DISCONTINUED] Elastic Bandages & Supports (MEDICAL COMPRESSION STOCKINGS) MISC 1 each by Does not apply route daily.   No facility-administered encounter medications on file as of 02/03/2022.    Allergies (verified) Patient has no known allergies.   History: Past Medical History:  Diagnosis Date   Anemia    Arthritis    Blood transfusion without reported diagnosis    Broken back    CAD (coronary artery disease)    PCI & CABG   Cataract    "LIGHT"-RIGHT   CHF (congestive heart failure) (HCC)    Diverticulitis    mild - approx 2004   Dysrhythmia    occasional arrythmias   Family history of heart disease    GERD (gastroesophageal reflux disease)    Heart murmur    HX OF YEARS AGO    History of kidney stones    History of nuclear stress test 06/30/2011   lexiscan; normal pattern of perfusion post-stress; low risk scan    Hyperlipidemia    Hypertension    IgA nephropathy    Irregular heart beat    Myocardial infarction ([place])    Pre-diabetes    Rectal cancer ([place]) 08/19/2019   Systemic hypertension    Past Surgical History:  Procedure Laterality Date   BRONCHIAL BIOPSY  11/30/2021   Procedure: BRONCHIAL BIOPSIES;  Surgeon: Garner Nash, DO;  Location: [facility];  Service: Pulmonary;;   BRONCHIAL NEEDLE ASPIRATION BIOPSY  11/30/2021   Procedure: BRONCHIAL NEEDLE ASPIRATION BIOPSIES;  Surgeon: Garner Nash, DO;  Location: [facility];  Service: Pulmonary;;   CARDIAC CATHETERIZATION  08/22/1995   PTCA of OM (Dr. Marella Chimes)   [place]     CORONARY ARTERY BYPASS GRAFT  01/22/2000   x5 - LIMA to LAD, SVG to diagonal, sequential SVG to ramus & OM, SVG to PDA (Dr. Tharon Aquas Trigt)   DIVERTING ILEOSTOMY N/A 06/10/2020   Procedure: DIVERTING LOOP ILEOSTOMY;  Surgeon: Leighton Ruff, MD;  Location: [facility];  Service: General;  Laterality: N/A;   FIDUCIAL MARKER PLACEMENT  11/30/2021    Procedure: FIDUCIAL MARKER PLACEMENT;  Surgeon: Garner Nash, DO;  Location: [facility];  Service: Pulmonary;;   ILEOSTOMY CLOSURE N/A 10/09/2020   Procedure: LOOP ILEOSTOMY REVERSAL;  Surgeon: Leighton Ruff, MD;  Location: [facility];  Service: General;  Laterality: N/A;   IR IMAGING GUIDED PORT INSERTION  09/04/2019   LAPAROSCOPIC DIVERTED COLOSTOMY N/A 12/31/2021   Procedure: LAPAROSCOPIC DIVERTING COLOSTOMY;  Surgeon: Leighton Ruff, MD;  Location: [facility];  Service: General;  Laterality:  N/A;   LUNG LOBECTOMY     left upper lobe   RECTAL BIOPSY N/A 07/09/2021   Procedure: BIOPSY RECTAL;  Surgeon: Leighton Ruff, MD;  Location: [facility];  Service: General;  Laterality: N/A;   RECTAL EXAM UNDER ANESTHESIA N/A 07/09/2021   Procedure: ANAL RECTAL EXAM UNDER ANESTHESIA, RIGID PROCTOSCOPY;  Surgeon: Leighton Ruff, MD;  Location: [facility];  Service: General;  Laterality: N/A;   TRANSTHORACIC ECHOCARDIOGRAM  12/24/2012   EF 55-60%, mild LVH, mild conc hypertrophy; mild MR; LA mildly dilated   XI ROBOTIC ASSISTED LOWER ANTERIOR RESECTION N/A 06/10/2020   Procedure: XI ROBOTIC ASSISTED LOWER ANTERIOR RESECTION, MOBILIZATION OF SPLENIC FLEXURE, RIGID PROCTOSCOPY;  Surgeon: Leighton Ruff, MD;  Location: [facility];  Service: General;  Laterality: N/A;   Family History  Problem Relation Age of Onset   Heart attack Father    Kidney failure Brother    Prostate cancer Brother    Heart attack Brother    Heart disease Brother    Hypertension Brother    Heart disease Brother    Hypertension Brother    Colon cancer Neg Hx    Esophageal cancer Neg Hx    Rectal cancer Neg Hx    Stomach cancer Neg Hx    Inflammatory bowel disease Neg Hx    Liver disease Neg Hx    Pancreatic cancer Neg Hx    Colon polyps Neg Hx    Social History   Socioeconomic History   Marital status: Single    Spouse name: n/a   Number of children: 0   Years of education: Not on file   Highest education level: Not on file   Occupational History   Occupation: retired  Tobacco Use   Smoking status: Never   Smokeless tobacco: Never  Vaping Use   Vaping Use: Never used  Substance and Sexual Activity   Alcohol use: Never   Drug use: No   Sexual activity: Not Currently  Other Topics Concern   Not on file  Social History Narrative   Lives alone.   Sister-in-Law lives next door.   Social Determinants of Health   Financial Resource Strain: Low Risk  (02/03/2022)   Overall Financial Resource Strain (CARDIA)    Difficulty of Paying Living Expenses: Not hard at all  Food Insecurity: No Food Insecurity (02/03/2022)   Hunger Vital Sign    Worried About Running Out of Food in the Last Year: Never true    Ran Out of Food in the Last Year: Never true  Transportation Needs: No Transportation Needs (02/03/2022)   PRAPARE - Hydrologist (Medical): No    Lack of Transportation (Non-Medical): No  Physical Activity: Insufficiently Active (02/03/2022)   Exercise Vital Sign    Days of Exercise per Week: 2 days    Minutes of Exercise per Session: 20 min  Stress: No Stress Concern Present (02/03/2022)   [place]    Feeling of Stress : Not at all  Social Connections: Socially Isolated (02/03/2022)   Social Connection and Isolation Panel [NHANES]    Frequency of Communication with Friends and Family: Twice a week    Frequency of Social Gatherings with Friends and Family: Once a week    Attends Religious Services: Never    Marine scientist or Organizations: No    Attends Archivist Meetings: Never    Marital Status: Never married    Tobacco Counseling Counseling  given: Not Answered   Clinical Intake:                 Diabetic?No         Activities of Daily Living    02/03/2022    3:55 PM 12/31/2021   11:00 AM  In your present state of health, do you have any difficulty performing the  following activities:  Hearing? 0 0  Vision? 1 0  Comment glasses, vision needs to be checked since having chemo   Difficulty concentrating or making decisions? 0 0  Walking or climbing stairs? 0 0  Dressing or bathing? 0 0  Doing errands, shopping? 0 0  Preparing Food and eating ? N   Using the Toilet? N   In the past six months, have you accidently leaked urine? N   Do you have problems with loss of bowel control? Y   Managing your Medications? N   Managing your Finances? N   Housekeeping or managing your Housekeeping? N     Patient Care Team: Bonnita Hollow, MD as PCP - General (Family Medicine) Delrae Rend, MD as Consulting Physician (Endocrinology) [place], [place] as Attending Physician (Neurosurgery)  Indicate any recent Medical Services you may have received from other than [facility] providers in the past year (date may be approximate).     Assessment:   This is a routine wellness examination for Jared White.  Hearing/Vision screen No results found.  Dietary issues and exercise activities discussed: Current Exercise Habits: Home exercise routine, Type of exercise: walking, Time (Minutes): 20, Frequency (Times/Week): 2, Weekly Exercise (Minutes/Week): 40, Intensity: Mild, Exercise limited by: cardiac condition(s)   Goals Addressed   None   Depression Screen    02/03/2022    3:50 PM 12/20/2021    2:58 PM 07/14/2017    3:23 PM 01/05/2017    9:18 AM 12/01/2016    1:22 PM 11/10/2016    9:04 AM 01/21/2016   10:21 AM  PHQ 2/9 Scores  PHQ - 2 Score 0 1 0 0 0 0 0  PHQ- 9 Score 1 2         Fall Risk    02/03/2022    3:54 PM 12/20/2021    2:18 PM 07/14/2017    3:23 PM 01/05/2017    9:18 AM 12/01/2016    1:22 PM  [place] in the past year? 0 0 No No No  Number falls in past yr: 0 0     Injury with Fall? 0 0     Risk for fall due to : Impaired balance/gait      Risk for fall due to: Comment uses a cane occasionally        FALL RISK  PREVENTION PERTAINING TO THE HOME:  Any stairs in or around the home? No  If so, are there any without handrails?  N/a Home free of loose throw rugs in walkways, pet beds, electrical cords, etc? No  Adequate lighting in your home to reduce risk of falls? No   ASSISTIVE DEVICES UTILIZED TO PREVENT FALLS:  Life alert? No  Use of a cane, walker or w/c? Yes  Cane sometimes. Grab bars in the bathroom? Yes  Shower chair or bench in shower? Yes  Elevated toilet seat or a handicapped toilet? No    Cognitive Function:        02/03/2022    3:59 PM  6CIT Screen  What Year? 0 points  What month? 0  points  What time? 0 points  Count back from 20 0 points  Months in reverse 0 points  Repeat phrase 2 points  Total Score 2 points    Immunizations Immunization History  Administered Date(s) Administered   Influenza-Unspecified 02/20/2017   PFIZER(Purple Top)SARS-COV-2 Vaccination 06/24/2019, 07/22/2019, 01/17/2020   Pneumococcal Conjugate-13 01/21/2014   Pneumococcal Polysaccharide-23 01/22/2015   Tdap 01/11/2019   Zoster Recombinat (Shingrix) 04/04/2018, 06/09/2018    TDAP status: Up to date  Flu Vaccine status: Due, Education has been provided regarding the importance of this vaccine. Advised may receive this vaccine at local pharmacy or Health Dept. Aware to provide a copy of the vaccination record if obtained from local pharmacy or Health Dept. Verbalized acceptance and understanding.  Pneumococcal vaccine status: Up to date  Covid-19 vaccine status: Information provided on how to obtain vaccines.   Qualifies for Shingles Vaccine? Yes   Zostavax completed  Completed shingrix   Shingrix Completed?: Yes  Screening Tests Health Maintenance  Topic Date Due   Hepatitis C Screening  Never done   INFLUENZA VACCINE  12/21/2021   COVID-19 Vaccine (4 - Pfizer risk series) 05/22/2022 (Originally 03/13/2020)   COLONOSCOPY (Pts 45-75yr Insurance coverage will need to be confirmed)   06/08/2022   TETANUS/TDAP  01/10/2029   Pneumonia Vaccine 76 Years old  Completed   Zoster Vaccines- Shingrix  Completed   HPV VACCINES  Aged Out    Health Maintenance  Health Maintenance Due  Topic Date Due   Hepatitis C Screening  Never done   INFLUENZA VACCINE  12/21/2021    Colorectal cancer screening: Type of screening: Colonoscopy. Completed 06/08/2021. Repeat every 10 years  Lung Cancer Screening: (Low Dose CT Chest recommended if Age 76-80years, 30 pack-year currently smoking OR have quit w/in 15years.) does not qualify.   Lung Cancer Screening Referral: n/a  Additional Screening:  Hepatitis C Screening: does qualify; Completed Needed  Vision Screening: Recommended annual ophthalmology exams for early detection of glaucoma and other disorders of the eye. Is the patient up to date with their annual eye exam?  No  Who is the provider or what is the name of the office in which the patient attends annual eye exams? Pt states he has an eye doctor. I am unable to locate in the chart. If pt is not established with a provider, would they like to be referred to a provider to establish care? No .   Dental Screening: Recommended annual dental exams for proper oral hygiene  Community Resource Referral / Chronic Care Management: CRR required this visit?  No   CCM required this visit?  No      Plan:     I have personally reviewed and noted the following in the patient's chart:   Medical and social history Use of alcohol, tobacco or illicit drugs  Current medications and supplements including opioid prescriptions. Patient is currently taking opioid prescriptions. Information provided to patient regarding non-opioid alternatives. Patient advised to discuss non-opioid treatment plan with their provider. Functional ability and status Nutritional status Physical activity Advanced directives List of other physicians Hospitalizations, surgeries, and ER visits in previous 12  months Vitals Screenings to include cognitive, depression, and falls Referrals and appointments  In addition, I have reviewed and discussed with patient certain preventive protocols, quality metrics, and best practice recommendations. A written personalized care plan for preventive services as well as general preventive health recommendations were provided to patient.     CAlphonzo Lemmings RN  02/04/2022   Nurse Notes: None

## 2022-02-04 ENCOUNTER — Ambulatory Visit: Payer: Medicare Other | Admitting: Radiation Oncology

## 2022-02-04 NOTE — Patient Instructions (Signed)
  Jared White , Thank you for taking time to come for your Medicare Wellness Visit. I appreciate your ongoing commitment to your health goals. Please review the following plan we discussed and let me know if I can assist you in the future.   These are the goals we discussed:  Goals   None     This is a list of the screening recommended for you and due dates:  Health Maintenance  Topic Date Due   Hepatitis C Screening: USPSTF Recommendation to screen - Ages 39-79 yo.  Never done   Flu Shot  12/21/2021   COVID-19 Vaccine (4 - Pfizer risk series) 05/22/2022*   Colon Cancer Screening  06/08/2022   Tetanus Vaccine  01/10/2029   Pneumonia Vaccine  Completed   Zoster (Shingles) Vaccine  Completed   HPV Vaccine  Aged Out  *Topic was postponed. The date shown is not the original due date.

## 2022-02-07 ENCOUNTER — Encounter: Payer: Medicare Other | Admitting: Internal Medicine

## 2022-02-07 ENCOUNTER — Ambulatory Visit: Payer: Medicare Other | Admitting: Radiation Oncology

## 2022-02-07 NOTE — Progress Notes (Deleted)
[facility] Pall[facility] Care Consult Note Telephone: [phone]  Fax: [phone]   Date of encounter: 02/07/22 12:36 PM PATIENT NAME: Jared White [place] [id number]   [phone] (home)  DOB: [birth year] MRN: [id number] PRIMARY CARE PROVIDER:    Bonnita Hollow, MD,  [place] [address] [phone]  REFERRING PROVIDER:   Bonnita Hollow, [place] [place],  [place] [address] [phone]  RESPONSIBLE PARTY:    Contact Information     Name Relation Home Work Mobile   [place]   [place] Relative [phone]  [phone]   Aldona Bar [phone]  [phone]   Charlane Ferretti [phone]  [phone]        Due to the COVID-19 crisis, this visit was done via telemedicine from my office and it was initiated and consent by this patient and or family.  I connected with  Everlean Alstrom Heizer OR [facility] on 02/07/22 by a video enabled telemedicine application and verified that I am speaking with the correct person using two identifiers.   I discussed the limitations of evaluation and management by telemedicine. The patient expressed understanding and agreed to proceed.  Palliative Care was asked to follow this patient by consultation request of  Bonnita Hollow, MD to address advance care planning and complex medical decision making. This is the initial visit.                                     ASSESSMENT AND PLAN / RECOMMENDATIONS:   Advance Care Planning/Goals of Care: Goals include to maximize quality of life and symptom management. Patient/health care surrogate gave his/her permission to discuss.Our advance care planning conversation included a discussion about:    The value and importance of advance care planning  Experiences with loved ones who have been seriously ill or have died  Exploration of personal, cultural or  spiritual beliefs that might influence medical decisions  Exploration of goals of care in the event of a sudden injury or illness  Identification  of a healthcare agent  Review and updating or creation of an  advance directive document . Decision not to resuscitate or to de-escalate disease focused treatments due to poor prognosis. CODE STATUS:  Symptom Management/Plan:    Follow up Palliative Care Visit: Palliative care will continue to follow for complex medical decision making, advance care planning, and clarification of goals. Return ***  I spent *** minutes providing this consultation. More than 50% of the time in this consultation was spent in counseling and care coordination.  This visit was coded based on medical decision making (MDM).***  PPS: ***0%  HOSPICE ELIGIBILITY/DIAGNOSIS: ***/***  Chief Complaint: ***  HISTORY OF PRESENT ILLNESS:  Jared White is a 76 y.o. year old male  with *** .   History obtained from review of EMR, discussion with primary team, and interview with family, facility staff/caregiver and/or Mr. Guerrant.   I reviewed available labs, medications, imaging, studies and related documents from the EMR.  Records reviewed and summarized above.   ROS  Review of Systems  Physical Exam: There were no vitals filed for this visit. There is no height or weight on file to calculate BMI. Wt Readings from Last 500 Encounters:  01/31/22 123 lb 12.8 oz (56.2 kg)  01/21/22 124 lb 12.8 oz (56.6 kg)  01/12/22 126 lb (  57.2 kg)  12/31/21 120 lb (54.4 kg)  12/27/21 128 lb 8 oz (58.3 kg)  12/22/21 124 lb 12.8 oz (56.6 kg)  12/22/21 123 lb 9.6 oz (56.1 kg)  12/20/21 125 lb 3.2 oz (56.8 kg)  12/15/21 121 lb 9.6 oz (55.2 kg)  12/09/21 120 lb (54.4 kg)  11/30/21 123 lb 3.8 oz (55.9 kg)  11/01/21 138 lb (62.6 kg)  09/17/21 133 lb 6.4 oz (60.5 kg)  09/06/21 133 lb 6.4 oz (60.5 kg)  08/11/21 137 lb (62.1 kg)  07/07/21 137 lb 3.2 oz (62.2 kg)  06/08/21 140 lb (63.5  kg)  06/01/21 140 lb (63.5 kg)  03/19/21 135 lb (61.2 kg)  10/11/20 142 lb 10.2 oz (64.7 kg)  09/30/20 139 lb (63 kg)  08/24/20 136 lb (61.7 kg)  07/31/20 144 lb 4.8 oz (65.5 kg)  06/11/20 156 lb 1.4 oz (70.8 kg)  06/03/20 156 lb (70.8 kg)  05/01/20 146 lb 4.8 oz (66.4 kg)  03/19/20 154 lb (69.9 kg)  03/19/20 154 lb 1.6 oz (69.9 kg)  03/09/20 154 lb (69.9 kg)  02/26/20 160 lb 1.6 oz (72.6 kg)  02/04/20 158 lb 3.2 oz (71.8 kg)  01/31/20 160 lb 9.6 oz (72.8 kg)  01/21/20 156 lb (70.8 kg)  01/15/20 158 lb 14.4 oz (72.1 kg)  01/01/20 157 lb (71.2 kg)  12/18/19 158 lb 1.6 oz (71.7 kg)  12/04/19 158 lb 8 oz (71.9 kg)  11/27/19 157 lb 6.4 oz (71.4 kg)  11/12/19 162 lb (73.5 kg)  10/30/19 165 lb 12.8 oz (75.2 kg)  10/15/19 168 lb (76.2 kg)  09/17/19 168 lb 3.2 oz (76.3 kg)  08/19/19 171 lb 8 oz (77.8 kg)  08/13/19 171 lb (77.6 kg)  07/25/19 171 lb (77.6 kg)  03/18/19 172 lb (78 kg)  12/20/18 170 lb 4.8 oz (77.2 kg)  09/13/18 175 lb (79.4 kg)  07/14/17 181 lb 12.8 oz (82.5 kg)  07/07/17 182 lb 9.6 oz (82.8 kg)  01/05/17 174 lb (78.9 kg)  12/09/16 179 lb (81.2 kg)  12/01/16 178 lb (80.7 kg)  11/10/16 177 lb (80.3 kg)  04/11/16 180 lb (81.6 kg)  01/21/16 180 lb 12.8 oz (82 kg)  01/09/16 179 lb 3.2 oz (81.3 kg)  11/06/15 180 lb (81.6 kg)  10/16/15 182 lb 6.4 oz (82.7 kg)  10/09/15 180 lb 1.6 oz (81.7 kg)  10/03/15 181 lb (82.1 kg)  11/21/13 180 lb 12.8 oz (82 kg)  03/14/13 182 lb 1.6 oz (82.6 kg)  12/14/12 188 lb 7.9 oz (85.5 kg)   Physical Exam  CURRENT PROBLEM LIST:  Patient Active Problem List   Diagnosis Date Noted   Rectal pain, chronic 01/31/2022   Rectal cancer metastasized to lung ([facility]) 12/31/2021   Dizziness 12/20/2021   Lung nodule 11/30/2021   PAF (paroxysmal atrial fibrillation) ([facility])    Protein-calorie malnutrition, severe 11/23/2021   Acute renal failure superimposed on stage 3b chronic kidney disease ([facility]) 11/21/2021   Hydroureteronephrosis 11/21/2021    Lumbar back pain 11/21/2021   Elevated troponin 11/21/2021   Pre-op exam 08/11/2021   Ileostomy in place [facility]) 10/09/2020   AKI (acute kidney injury) ([facility]) 08/24/2020   Preop cardiovascular exam 03/19/2020   Gastric polyps 02/07/2020   History of rectal cancer 02/07/2020   History of colonic polyps 02/07/2020   Barrett's esophagus without dysplasia 02/07/2020   Port-A-Cath in place 11/27/2019   Weight loss 11/27/2019   Rectal adenocarcinoma metastatic to lung ([place] [facility]) 08/19/2019   Abnormal MRI, pelvis  07/26/2019   Abnormal CT scan, pelvis 07/26/2019   Rectal bleeding 07/26/2019   Hemorrhoids 07/26/2019   Constipation 07/26/2019   Stage 3b chronic kidney disease ([facility]) 09/13/2018   Coronary artery disease involving native coronary artery of native heart without angina pectoris 09/13/2018   Microalbuminuria 10/16/2015   Facial numbness    Hypomagnesemia    Hypocalcemia 10/08/2015   Psoriasis 11/21/2013   S/P CABG x 4 03/14/2013   Multiple thoracic/lumbar transverse process fractures 12/15/2012   Fall from roof 12/15/2012   CAD (coronary artery disease) 12/15/2012   GERD (gastroesophageal reflux disease) 12/15/2012   Essential hypertension 12/15/2012   Hyperlipidemia 12/15/2012   Multiple bilateral rib fractures    PAST MEDICAL HISTORY:  Active Ambulatory Problems    Diagnosis Date Noted   Multiple bilateral rib fractures    Multiple thoracic/lumbar transverse process fractures 12/15/2012   Fall from roof 12/15/2012   CAD (coronary artery disease) 12/15/2012   GERD (gastroesophageal reflux disease) 12/15/2012   Essential hypertension 12/15/2012   Hyperlipidemia 12/15/2012   S/P CABG x 4 03/14/2013   Psoriasis 11/21/2013   Hypocalcemia 10/08/2015   Facial numbness    Hypomagnesemia    Microalbuminuria 10/16/2015   Stage 3b chronic kidney disease ([place]) 09/13/2018   Coronary artery disease involving native coronary artery of native heart without angina pectoris 09/13/2018    Abnormal MRI, pelvis 07/26/2019   Abnormal CT scan, pelvis 07/26/2019   Rectal bleeding 07/26/2019   Hemorrhoids 07/26/2019   Constipation 07/26/2019   Rectal adenocarcinoma metastatic to lung ([facility]) 08/19/2019   Port-A-Cath in place 11/27/2019   Weight loss 11/27/2019   Gastric polyps 02/07/2020   History of rectal cancer 02/07/2020   History of colonic polyps 02/07/2020   Barrett's esophagus without dysplasia 02/07/2020   Preop cardiovascular exam 03/19/2020   AKI (acute kidney injury) ([place]) 08/24/2020   Ileostomy in place ([facility]) 10/09/2020   Pre-op exam 08/11/2021   Acute renal failure superimposed on stage 3b chronic kidney disease ([facility]) 11/21/2021   Hydroureteronephrosis 11/21/2021   Lumbar back pain 11/21/2021   Elevated troponin 11/21/2021   Protein-calorie malnutrition, severe 11/23/2021   PAF (paroxysmal atrial fibrillation) ([place])    Lung nodule 11/30/2021   Dizziness 12/20/2021   Rectal cancer metastasized to lung ([facility]) 12/31/2021   Rectal pain, chronic 01/31/2022   Resolved Ambulatory Problems    Diagnosis Date Noted   No Resolved Ambulatory Problems   Past Medical History:  Diagnosis Date   Anemia    Arthritis    Blood transfusion without reported diagnosis    Broken back    Cataract    CHF (congestive heart failure) ([facility])    Diverticulitis    Dysrhythmia    Family history of heart disease    Heart murmur    History of kidney stones    History of nuclear stress test 06/30/2011   Hypertension    IgA nephropathy    Irregular heart beat    Myocardial infarction ([facility])    Pre-diabetes    Rectal cancer ([facility]) 08/19/2019   Systemic hypertension    SOCIAL HX:  Social History   Tobacco Use   Smoking status: Never   Smokeless tobacco: Never  Substance Use Topics   Alcohol use: Never   FAMILY HX:  Family History  Problem Relation Age of Onset   Heart attack Father    Kidney failure Brother    Prostate cancer Brother    Heart attack Brother     Heart disease Brother  Hypertension Brother    Heart disease Brother    Hypertension Brother    Colon cancer Neg Hx    Esophageal cancer Neg Hx    Rectal cancer Neg Hx    Stomach cancer Neg Hx    Inflammatory bowel disease Neg Hx    Liver disease Neg Hx    Pancreatic cancer Neg Hx    Colon polyps Neg Hx       ALLERGIES: No Known Allergies    PERTINENT MEDICATIONS:  Outpatient Encounter Medications as of 02/07/2022  Medication Sig   apixaban (ELIQUIS) 2.5 MG TABS tablet Take 2.5 mg by mouth 2 (two) times daily. Pt approved to stop taking when having rectal bleeding. Currently not taking.   atenolol (TENORMIN) 50 MG tablet Take 1 tablet (50 mg total) by mouth daily.   Calcium Carbonate (CALCIUM 500 PO) Take 1,000 mg by mouth in the morning and at bedtime.   cyanocobalamin (VITAMIN B12) 1000 MCG tablet Take by mouth.   diltiazem (CARDIZEM CD) 120 MG 24 hr capsule Take 1 capsule (120 mg total) by mouth daily.   fludrocortisone (FLORINEF) 0.1 MG tablet Take 1 tablet (0.1 mg total) by mouth daily.   MAGNESIUM PO Take 250 mg by mouth daily.   nitroGLYCERIN (NITROSTAT) 0.4 MG SL tablet Place 0.4 mg under the tongue every 5 (five) minutes x 3 doses as needed for chest pain.   omeprazole (PRILOSEC) 40 MG capsule NEW PRESCRIPTION REQUEST: Omeprazole Dr 40 Mg Capsule - TAKE ONE CAPSULE BY MOUTH TWICE DAILY   oxyCODONE (OXY IR/ROXICODONE) 5 MG immediate release tablet Take 1 tablet (5 mg total) by mouth every 4 (four) hours as needed for moderate pain, severe pain or breakthrough pain.   rosuvastatin (CRESTOR) 20 MG tablet TAKE 1 TABLET BY MOUTH EVERY DAY   VITAMIN A PO Take by mouth.   VITAMIN D PO Take by mouth.   No facility-administered encounter medications on file as of 02/07/2022.    Thank you for the opportunity to participate in the care of Mr. Cocuzza.  The palliative care team will continue to follow. Please call our office at [phone] if we can be of additional assistance.    Hollace Kinnier, DO  COVID-19 PATIENT SCREENING TOOL Asked and negative response unless otherwise noted:  Have you had symptoms of covid, tested positive or been in contact with someone with symptoms/positive test in the past 5-10 days?  NO

## 2022-02-08 ENCOUNTER — Ambulatory Visit
Admission: RE | Admit: 2022-02-08 | Discharge: 2022-02-08 | Disposition: A | Discharge status: Home / Self Care | Payer: Medicare Other | Source: Ambulatory Visit | Attending: Radiation Oncology | Admitting: Radiation Oncology

## 2022-02-08 ENCOUNTER — Other Ambulatory Visit: Payer: Self-pay

## 2022-02-08 DIAGNOSIS — Z510 Encounter for antineoplastic radiation therapy: Secondary | ICD-10-CM | POA: Diagnosis not present

## 2022-02-08 DIAGNOSIS — C20 Malignant neoplasm of rectum: Secondary | ICD-10-CM

## 2022-02-08 LAB — RAD ONC ARIA SESSION SUMMARY
Course Elapsed Days: 5
Plan Fractions Treated to Date: 2
Plan Prescribed Dose Per Fraction: 18 Gy
Plan Total Fractions Prescribed: 3
Plan Total Prescribed Dose: 54 Gy
Reference Point Dosage Given to Date: 36 Gy
Reference Point Session Dosage Given: 18 Gy
Session Number: 2

## 2022-02-10 ENCOUNTER — Encounter: Payer: Self-pay | Admitting: Urology

## 2022-02-10 ENCOUNTER — Other Ambulatory Visit: Payer: Self-pay

## 2022-02-10 ENCOUNTER — Ambulatory Visit
Admission: RE | Admit: 2022-02-10 | Discharge: 2022-02-10 | Disposition: A | Discharge status: Home / Self Care | Payer: Medicare Other | Source: Ambulatory Visit | Attending: Radiation Oncology | Admitting: Radiation Oncology

## 2022-02-10 DIAGNOSIS — C7800 Secondary malignant neoplasm of unspecified lung: Secondary | ICD-10-CM

## 2022-02-10 DIAGNOSIS — Z510 Encounter for antineoplastic radiation therapy: Secondary | ICD-10-CM | POA: Diagnosis not present

## 2022-02-10 LAB — RAD ONC ARIA SESSION SUMMARY
Course Elapsed Days: 7
Plan Fractions Treated to Date: 3
Plan Prescribed Dose Per Fraction: 18 Gy
Plan Total Fractions Prescribed: 3
Plan Total Prescribed Dose: 54 Gy
Reference Point Dosage Given to Date: 54 Gy
Reference Point Session Dosage Given: 18 Gy
Session Number: 3

## 2022-02-11 ENCOUNTER — Ambulatory Visit (HOSPITAL_COMMUNITY)
Admission: RE | Admit: 2022-02-11 | Discharge: 2022-02-11 | Disposition: A | Discharge status: Home / Self Care | Payer: Medicare Other | Source: Ambulatory Visit | Attending: General Surgery | Admitting: General Surgery

## 2022-02-11 DIAGNOSIS — C20 Malignant neoplasm of rectum: Secondary | ICD-10-CM | POA: Diagnosis not present

## 2022-02-11 DIAGNOSIS — Z433 Encounter for attention to colostomy: Secondary | ICD-10-CM | POA: Insufficient documentation

## 2022-02-11 DIAGNOSIS — L24B3 Irritant contact dermatitis related to fecal or urinary stoma or fistula: Secondary | ICD-10-CM | POA: Insufficient documentation

## 2022-02-11 DIAGNOSIS — C7800 Secondary malignant neoplasm of unspecified lung: Secondary | ICD-10-CM | POA: Insufficient documentation

## 2022-02-11 NOTE — Progress Notes (Signed)
[facility]   Reason for visit:  LLQ colostomy  Has had previous ileostomy  Here with his friend that has been staying with him but needs to return home soon. He states pouch has leaked some lately. Patient continues to lose weight and is considering Hospice services HPI:  Rectal cancer with metastasis to lung. End colostomy ROS  Review of Systems  Constitutional:  Positive for appetite change and unexpected weight change.       Weight loss and appetite decreased  Gastrointestinal:        LLQ colostomy  Psychiatric/Behavioral: Negative.    All other systems reviewed and are negative.  Vital signs:  BP 134/79 (BP Location: Right Arm)   Pulse 71   Temp 98.9 F (37.2 C) (Oral)   Resp 18   SpO2 95%  Exam:  Physical Exam Vitals reviewed.  Abdominal:     Palpations: Abdomen is soft.     Comments: Creasing around stoma from recent weight loss  Skin:    General: Skin is warm and dry.     Findings: Erythema present.     Comments: Peristomal redness  Neurological:     Mental Status: He is alert and oriented to person, place, and time.  Psychiatric:        Mood and Affect: Mood normal.        Behavior: Behavior normal.     Stoma type/location:  1" LLQ budded with sagging skin to peristomal area  recent weight loss Stomal assessment/size:  1" pink and moist Peristomal assessment:  skin intact but uneven surface from sagging skin.  WIll implement convex system and belt. (Will need medium belt that is special order. ) Treatment options for stomal/peristomal skin: barrier ring  2 piece convex system (prefers)  ostomy belt.  Output: soft brown stool Ostomy pouching: 2pc. 2 3/4" convex system barrier ring and belt Education provided:  discussed convexity and benefit of belt.  Friend wants to make sure he is established with supply company before he goes back home. I will establish with [facility]    Impression/dx  End colostomy Weight loss Discussion  See above  requires  convexity Plan  See back as needed    Visit time: 45 minutes.   Domenic Moras FNP-BC

## 2022-02-11 NOTE — Discharge Instructions (Signed)
I will set up with [facility] for supplies 2 piece 2 1/4" convex pouch system with filter recommended Medium ostomy belt Barrier ring Paste and skin prep used at times

## 2022-02-17 ENCOUNTER — Telehealth: Payer: Self-pay | Admitting: Oncology

## 2022-02-17 NOTE — Telephone Encounter (Signed)
Called patient regarding upcoming October appointment, patient is notified.   

## 2022-02-18 ENCOUNTER — Other Ambulatory Visit (HOSPITAL_COMMUNITY): Payer: Self-pay | Admitting: Nurse Practitioner

## 2022-02-18 DIAGNOSIS — K9400 Colostomy complication, unspecified: Secondary | ICD-10-CM

## 2022-02-18 DIAGNOSIS — L24B3 Irritant contact dermatitis related to fecal or urinary stoma or fistula: Secondary | ICD-10-CM

## 2022-02-19 NOTE — Progress Notes (Signed)
This encounter was created in error - please disregard.

## 2022-02-24 ENCOUNTER — Other Ambulatory Visit: Payer: Self-pay

## 2022-02-24 ENCOUNTER — Inpatient Hospital Stay (HOSPITAL_COMMUNITY)
Admission: EM | Admit: 2022-02-24 | Discharge: 2022-03-04 | DRG: 640 | Disposition: A | Discharge status: Skilled Nursing Facility | Payer: Medicare Other | Attending: Internal Medicine | Admitting: Internal Medicine

## 2022-02-24 ENCOUNTER — Emergency Department (HOSPITAL_COMMUNITY): Payer: Medicare Other

## 2022-02-24 DIAGNOSIS — Z8249 Family history of ischemic heart disease and other diseases of the circulatory system: Secondary | ICD-10-CM

## 2022-02-24 DIAGNOSIS — K6289 Other specified diseases of anus and rectum: Secondary | ICD-10-CM | POA: Diagnosis present

## 2022-02-24 DIAGNOSIS — K219 Gastro-esophageal reflux disease without esophagitis: Secondary | ICD-10-CM | POA: Diagnosis present

## 2022-02-24 DIAGNOSIS — Z681 Body mass index (BMI) 19 or less, adult: Secondary | ICD-10-CM

## 2022-02-24 DIAGNOSIS — E785 Hyperlipidemia, unspecified: Secondary | ICD-10-CM

## 2022-02-24 DIAGNOSIS — Z951 Presence of aortocoronary bypass graft: Secondary | ICD-10-CM

## 2022-02-24 DIAGNOSIS — F05 Delirium due to known physiological condition: Secondary | ICD-10-CM | POA: Diagnosis present

## 2022-02-24 DIAGNOSIS — E860 Dehydration: Secondary | ICD-10-CM | POA: Diagnosis present

## 2022-02-24 DIAGNOSIS — R531 Weakness: Secondary | ICD-10-CM | POA: Diagnosis not present

## 2022-02-24 DIAGNOSIS — Z1152 Encounter for screening for COVID-19: Secondary | ICD-10-CM

## 2022-02-24 DIAGNOSIS — I2510 Atherosclerotic heart disease of native coronary artery without angina pectoris: Secondary | ICD-10-CM | POA: Diagnosis not present

## 2022-02-24 DIAGNOSIS — G9341 Metabolic encephalopathy: Secondary | ICD-10-CM

## 2022-02-24 DIAGNOSIS — R7303 Prediabetes: Secondary | ICD-10-CM | POA: Diagnosis present

## 2022-02-24 DIAGNOSIS — D649 Anemia, unspecified: Secondary | ICD-10-CM | POA: Diagnosis present

## 2022-02-24 DIAGNOSIS — Z515 Encounter for palliative care: Secondary | ICD-10-CM

## 2022-02-24 DIAGNOSIS — Z433 Encounter for attention to colostomy: Secondary | ICD-10-CM

## 2022-02-24 DIAGNOSIS — C20 Malignant neoplasm of rectum: Secondary | ICD-10-CM

## 2022-02-24 DIAGNOSIS — Z66 Do not resuscitate: Secondary | ICD-10-CM | POA: Diagnosis present

## 2022-02-24 DIAGNOSIS — Z8042 Family history of malignant neoplasm of prostate: Secondary | ICD-10-CM

## 2022-02-24 DIAGNOSIS — I2489 Other forms of acute ischemic heart disease: Secondary | ICD-10-CM | POA: Diagnosis present

## 2022-02-24 DIAGNOSIS — Z933 Colostomy status: Secondary | ICD-10-CM

## 2022-02-24 DIAGNOSIS — C7800 Secondary malignant neoplasm of unspecified lung: Secondary | ICD-10-CM

## 2022-02-24 DIAGNOSIS — R627 Adult failure to thrive: Secondary | ICD-10-CM | POA: Diagnosis not present

## 2022-02-24 DIAGNOSIS — I252 Old myocardial infarction: Secondary | ICD-10-CM

## 2022-02-24 DIAGNOSIS — I480 Paroxysmal atrial fibrillation: Secondary | ICD-10-CM

## 2022-02-24 DIAGNOSIS — Z85048 Personal history of other malignant neoplasm of rectum, rectosigmoid junction, and anus: Secondary | ICD-10-CM

## 2022-02-24 DIAGNOSIS — D696 Thrombocytopenia, unspecified: Secondary | ICD-10-CM | POA: Diagnosis present

## 2022-02-24 DIAGNOSIS — N1832 Chronic kidney disease, stage 3b: Secondary | ICD-10-CM

## 2022-02-24 DIAGNOSIS — I130 Hypertensive heart and chronic kidney disease with heart failure and stage 1 through stage 4 chronic kidney disease, or unspecified chronic kidney disease: Secondary | ICD-10-CM | POA: Diagnosis present

## 2022-02-24 DIAGNOSIS — Z79899 Other long term (current) drug therapy: Secondary | ICD-10-CM

## 2022-02-24 DIAGNOSIS — Z7901 Long term (current) use of anticoagulants: Secondary | ICD-10-CM

## 2022-02-24 DIAGNOSIS — R2681 Unsteadiness on feet: Secondary | ICD-10-CM | POA: Diagnosis present

## 2022-02-24 DIAGNOSIS — Z841 Family history of disorders of kidney and ureter: Secondary | ICD-10-CM

## 2022-02-24 DIAGNOSIS — Z87442 Personal history of urinary calculi: Secondary | ICD-10-CM

## 2022-02-24 DIAGNOSIS — F03B0 Unspecified dementia, moderate, without behavioral disturbance, psychotic disturbance, mood disturbance, and anxiety: Secondary | ICD-10-CM | POA: Diagnosis present

## 2022-02-24 DIAGNOSIS — E872 Acidosis, unspecified: Secondary | ICD-10-CM | POA: Diagnosis present

## 2022-02-24 DIAGNOSIS — R918 Other nonspecific abnormal finding of lung field: Secondary | ICD-10-CM | POA: Diagnosis present

## 2022-02-24 DIAGNOSIS — R197 Diarrhea, unspecified: Secondary | ICD-10-CM | POA: Diagnosis present

## 2022-02-24 DIAGNOSIS — E43 Unspecified severe protein-calorie malnutrition: Secondary | ICD-10-CM | POA: Diagnosis present

## 2022-02-24 DIAGNOSIS — Z932 Ileostomy status: Secondary | ICD-10-CM

## 2022-02-24 DIAGNOSIS — E876 Hypokalemia: Secondary | ICD-10-CM | POA: Diagnosis present

## 2022-02-24 LAB — LIPASE, BLOOD: Lipase: 37 U/L (ref 11–51)

## 2022-02-24 LAB — TROPONIN I (HIGH SENSITIVITY)
Troponin I (High Sensitivity): 14 ng/L (ref <18)
Troponin I (High Sensitivity): 18 ng/L — ABNORMAL HIGH (ref <18)

## 2022-02-24 LAB — CBC WITH DIFFERENTIAL/PLATELET
Abs Immature Granulocytes: 0.05 10*3/uL (ref 0.00–0.07)
Basophils Absolute: 0 10*3/uL (ref 0.0–0.1)
Basophils Relative: 0 %
Eosinophils Absolute: 0 10*3/uL (ref 0.0–0.5)
Eosinophils Relative: 0 %
HCT: 34.6 % — ABNORMAL LOW (ref 39.0–52.0)
Hemoglobin: 11.8 g/dL — ABNORMAL LOW (ref 13.0–17.0)
Immature Granulocytes: 1 %
Lymphocytes Relative: 9 %
Lymphs Abs: 0.6 10*3/uL — ABNORMAL LOW (ref 0.7–4.0)
MCH: 32 pg (ref 26.0–34.0)
MCHC: 34.1 g/dL (ref 30.0–36.0)
MCV: 93.8 fL (ref 80.0–100.0)
Monocytes Absolute: 0.4 10*3/uL (ref 0.1–1.0)
Monocytes Relative: 6 %
Neutro Abs: 5.6 10*3/uL (ref 1.7–7.7)
Neutrophils Relative %: 84 %
Platelets: 151 10*3/uL (ref 150–400)
RBC: 3.69 MIL/uL — ABNORMAL LOW (ref 4.22–5.81)
RDW: 13.2 % (ref 11.5–15.5)
WBC: 6.6 10*3/uL (ref 4.0–10.5)
nRBC: 0 % (ref 0.0–0.2)

## 2022-02-24 LAB — COMPREHENSIVE METABOLIC PANEL
ALT: 15 U/L (ref 0–44)
AST: 31 U/L (ref 15–41)
Albumin: 3.3 g/dL — ABNORMAL LOW (ref 3.5–5.0)
Alkaline Phosphatase: 66 U/L (ref 38–126)
Anion gap: 13 (ref 5–15)
BUN: 30 mg/dL — ABNORMAL HIGH (ref 8–23)
CO2: 23 mmol/L (ref 22–32)
Calcium: 7.2 mg/dL — ABNORMAL LOW (ref 8.9–10.3)
Chloride: 104 mmol/L (ref 98–111)
Creatinine, Ser: 1.34 mg/dL — ABNORMAL HIGH (ref 0.61–1.24)
GFR, Estimated: 55 mL/min — ABNORMAL LOW (ref >60)
Glucose, Bld: 161 mg/dL — ABNORMAL HIGH (ref 70–99)
Potassium: 3.5 mmol/L (ref 3.5–5.1)
Sodium: 140 mmol/L (ref 135–145)
Total Bilirubin: 0.8 mg/dL (ref 0.3–1.2)
Total Protein: 6.6 g/dL (ref 6.5–8.1)

## 2022-02-24 LAB — LACTIC ACID, PLASMA
Lactic Acid, Venous: 3.6 mmol/L (ref 0.5–1.9)
Lactic Acid, Venous: 2.7 mmol/L (ref 0.5–1.9)

## 2022-02-24 LAB — TYPE AND SCREEN
ABO/RH(D): A POS
Antibody Screen: NEGATIVE

## 2022-02-24 LAB — RESP PANEL BY RT-PCR (FLU A&B, COVID) ARPGX2
Influenza A by PCR: NEGATIVE
Influenza B by PCR: NEGATIVE
SARS Coronavirus 2 by RT PCR: NEGATIVE

## 2022-02-24 LAB — PROTIME-INR
INR: 1.1 (ref 0.8–1.2)
Prothrombin Time: 14.2 seconds (ref 11.4–15.2)

## 2022-02-24 MED ORDER — PANTOPRAZOLE SODIUM 40 MG PO TBEC
40.0000 mg | DELAYED_RELEASE_TABLET | Freq: Every day | ORAL | Status: DC
  Administered 2022-02-25 – 2022-03-04 (×8): 40 mg via ORAL
  Filled 2022-02-24 (×8): qty 1

## 2022-02-24 MED ORDER — ROSUVASTATIN CALCIUM 20 MG PO TABS
20.0000 mg | ORAL_TABLET | Freq: Every day | ORAL | Status: DC
  Administered 2022-02-25 – 2022-03-01 (×5): 20 mg via ORAL
  Filled 2022-02-24 (×5): qty 1

## 2022-02-24 MED ORDER — ATENOLOL 50 MG PO TABS
50.0000 mg | ORAL_TABLET | Freq: Every day | ORAL | Status: DC
  Administered 2022-02-25 – 2022-03-01 (×5): 50 mg via ORAL
  Filled 2022-02-24 (×5): qty 1

## 2022-02-24 MED ORDER — APIXABAN 2.5 MG PO TABS
2.5000 mg | ORAL_TABLET | Freq: Two times a day (BID) | ORAL | Status: DC
  Administered 2022-02-24 – 2022-03-01 (×9): 2.5 mg via ORAL
  Filled 2022-02-24 (×11): qty 1

## 2022-02-24 MED ORDER — ONDANSETRON HCL 4 MG/2ML IJ SOLN
4.0000 mg | Freq: Once | INTRAMUSCULAR | Status: AC
  Administered 2022-02-24: 4 mg via INTRAVENOUS
  Filled 2022-02-24: qty 2

## 2022-02-24 MED ORDER — SODIUM CHLORIDE 0.9 % IV BOLUS
500.0000 mL | Freq: Once | INTRAVENOUS | Status: AC
  Administered 2022-02-24: 500 mL via INTRAVENOUS

## 2022-02-24 MED ORDER — DILTIAZEM HCL ER BEADS 120 MG PO CP24: 120.0000 mg | ORAL_CAPSULE | Freq: Every day | ORAL | Status: DC

## 2022-02-24 MED ORDER — MAGNESIUM OXIDE -MG SUPPLEMENT 400 (240 MG) MG PO TABS
200.0000 mg | ORAL_TABLET | Freq: Every day | ORAL | Status: DC
  Administered 2022-02-25 – 2022-03-01 (×5): 200 mg via ORAL
  Filled 2022-02-24 (×5): qty 1

## 2022-02-24 MED ORDER — CALCIUM GLUCONATE-NACL 1-0.675 GM/50ML-% IV SOLN
1.0000 g | Freq: Once | INTRAVENOUS | Status: AC
  Administered 2022-02-24: 1000 mg via INTRAVENOUS
  Filled 2022-02-24: qty 50

## 2022-02-24 MED ORDER — VITAMIN B-12 1000 MCG PO TABS
1000.0000 ug | ORAL_TABLET | Freq: Every day | ORAL | Status: DC
  Administered 2022-02-25 – 2022-03-01 (×5): 1000 ug via ORAL
  Filled 2022-02-24 (×5): qty 1

## 2022-02-24 MED ORDER — FLUDROCORTISONE ACETATE 0.1 MG PO TABS
0.1000 mg | ORAL_TABLET | Freq: Every day | ORAL | Status: DC
  Administered 2022-02-25 – 2022-03-01 (×5): 0.1 mg via ORAL
  Filled 2022-02-24 (×6): qty 1

## 2022-02-24 MED ORDER — SODIUM CHLORIDE 0.9 % IV BOLUS
1000.0000 mL | Freq: Once | INTRAVENOUS | Status: AC
  Administered 2022-02-24: 1000 mL via INTRAVENOUS

## 2022-02-24 MED ORDER — OXYCODONE HCL 5 MG PO TABS
5.0000 mg | ORAL_TABLET | ORAL | Status: DC | PRN
  Administered 2022-02-28 – 2022-03-04 (×4): 5 mg via ORAL
  Filled 2022-02-24 (×4): qty 1

## 2022-02-24 MED ORDER — SODIUM CHLORIDE 0.9 % IV SOLN: Freq: Once | INTRAVENOUS | Status: AC

## 2022-02-24 MED ORDER — MAGNESIUM 200 MG PO TABS: 250.0000 mg | ORAL_TABLET | Freq: Every day | ORAL | Status: DC

## 2022-02-24 MED ORDER — DILTIAZEM HCL ER COATED BEADS 120 MG PO CP24
120.0000 mg | ORAL_CAPSULE | Freq: Every day | ORAL | Status: DC
  Administered 2022-02-25 – 2022-03-04 (×8): 120 mg via ORAL
  Filled 2022-02-24 (×8): qty 1

## 2022-02-24 MED ORDER — CALCIUM CARBONATE 1250 (500 CA) MG PO TABS
1.0000 | ORAL_TABLET | Freq: Two times a day (BID) | ORAL | Status: DC
  Administered 2022-02-25 – 2022-03-01 (×8): 1250 mg via ORAL
  Filled 2022-02-24 (×8): qty 1

## 2022-02-24 NOTE — ED Notes (Signed)
Secure message sent to receiving RN Milus Banister, awaiting response. Pt has an inpatient bed at this time

## 2022-02-24 NOTE — H&P (Signed)
History and Physical    Patient: Jared White LNL:[id number] DOB: 03/13/1946 DOA: 02/24/2022 DOS: the patient was seen and examined on 02/25/2022 PCP: Bonnita Hollow, MD  Patient coming from: Home  Chief Complaint:  Chief Complaint  Patient presents with   Fatigue   HPI: Jared White is a 76 y.o. male with medical history significant of CAD s/p CABG, CKD3A, HTN, HLD,A.fib on Eliquis, recurrent rectal adenocarcinoma with mets to lung s/p reversal of diverting ileostomy now with diverting loop colostomy presenting with decrease weakness and inability to care for himself.   He is alert and oriented x3 but appears disoriented at times. Tells me he has been passing out at home and later that it was in the last hour. During exam he would suddenly close his eyes and tell me he is "blacking out" now but would continue to talk to me and follow commands. Reports rectal pain and dry heaving.   History per documentation, pt lives alone and has a good friend who is a Engineer, drilling. Over the past 2 weeks had decrease ability to care for himself. Today he was found covered in feces. Last had SBRT treatment on 02/10/22.  In the ED, he was afebrile mildly hypertensive to SBP 160s on room air.  No leukocytosis.  Appears to be hemoconcentrated with hemoglobin 11.6, hematocrit of 34.6.  Lactate of 3.6.  Creatinine stable 1.4.  Correct Ca of 7.4.  Review of Systems: unable to review all systems due to the inability of the patient to answer questions. Past Medical History:  Diagnosis Date   Anemia    Arthritis    Blood transfusion without reported diagnosis    Broken back    CAD (coronary artery disease)    PCI & CABG   Cataract    "LIGHT"-RIGHT   CHF (congestive heart failure) (HCC)    Diverticulitis    mild - approx 2004   Dysrhythmia    occasional arrythmias   Family history of heart disease    GERD (gastroesophageal reflux disease)    Heart murmur    HX OF YEARS AGO    History of kidney  stones    History of nuclear stress test 06/30/2011   lexiscan; normal pattern of perfusion post-stress; low risk scan    Hyperlipidemia    Hypertension    IgA nephropathy    Irregular heart beat    Myocardial infarction ([place])    Pre-diabetes    Rectal cancer ([place]) 08/19/2019   Systemic hypertension    Past Surgical History:  Procedure Laterality Date   BRONCHIAL BIOPSY  11/30/2021   Procedure: BRONCHIAL BIOPSIES;  Surgeon: Garner Nash, DO;  Location: [facility];  Service: Pulmonary;;   BRONCHIAL NEEDLE ASPIRATION BIOPSY  11/30/2021   Procedure: BRONCHIAL NEEDLE ASPIRATION BIOPSIES;  Surgeon: Garner Nash, DO;  Location: [facility];  Service: Pulmonary;;   CARDIAC CATHETERIZATION  08/22/1995   PTCA of OM (Dr. Marella Chimes)   [place]     CORONARY ARTERY BYPASS GRAFT  01/22/2000   x5 - LIMA to LAD, SVG to diagonal, sequential SVG to ramus & OM, SVG to PDA (Dr. Tharon Aquas Trigt)   DIVERTING ILEOSTOMY N/A 06/10/2020   Procedure: DIVERTING LOOP ILEOSTOMY;  Surgeon: Leighton Ruff, MD;  Location: [facility];  Service: General;  Laterality: N/A;   FIDUCIAL MARKER PLACEMENT  11/30/2021   Procedure: FIDUCIAL MARKER PLACEMENT;  Surgeon: Garner Nash, DO;  Location: [facility];  Service:  Pulmonary;;   ILEOSTOMY CLOSURE N/A 10/09/2020   Procedure: LOOP ILEOSTOMY REVERSAL;  Surgeon: Leighton Ruff, MD;  Location: [facility];  Service: General;  Laterality: N/A;   IR IMAGING GUIDED PORT INSERTION  09/04/2019   LAPAROSCOPIC DIVERTED COLOSTOMY N/A 12/31/2021   Procedure: LAPAROSCOPIC DIVERTING COLOSTOMY;  Surgeon: Leighton Ruff, MD;  Location: [facility];  Service: General;  Laterality: N/A;   LUNG LOBECTOMY     left upper lobe   RECTAL BIOPSY N/A 07/09/2021   Procedure: BIOPSY RECTAL;  Surgeon: Leighton Ruff, MD;  Location: [facility];  Service: General;  Laterality: N/A;   RECTAL EXAM UNDER ANESTHESIA N/A 07/09/2021   Procedure: ANAL RECTAL EXAM UNDER ANESTHESIA,  RIGID PROCTOSCOPY;  Surgeon: Leighton Ruff, MD;  Location: [facility];  Service: General;  Laterality: N/A;   TRANSTHORACIC ECHOCARDIOGRAM  12/24/2012   EF 55-60%, mild LVH, mild conc hypertrophy; mild MR; LA mildly dilated   XI ROBOTIC ASSISTED LOWER ANTERIOR RESECTION N/A 06/10/2020   Procedure: XI ROBOTIC ASSISTED LOWER ANTERIOR RESECTION, MOBILIZATION OF SPLENIC FLEXURE, RIGID PROCTOSCOPY;  Surgeon: Leighton Ruff, MD;  Location: [facility];  Service: General;  Laterality: N/A;   Social History:  reports that he has never smoked. He has never used smokeless tobacco. He reports that he does not drink alcohol and does not use drugs.  No Known Allergies  Family History  Problem Relation Age of Onset   Heart attack Father    Kidney failure Brother    Prostate cancer Brother    Heart attack Brother    Heart disease Brother    Hypertension Brother    Heart disease Brother    Hypertension Brother    Colon cancer Neg Hx    Esophageal cancer Neg Hx    Rectal cancer Neg Hx    Stomach cancer Neg Hx    Inflammatory bowel disease Neg Hx    Liver disease Neg Hx    Pancreatic cancer Neg Hx    Colon polyps Neg Hx     Prior to Admission medications   Medication Sig Start Date End Date Taking? Authorizing Provider  atenolol (TENORMIN) 50 MG tablet Take 1 tablet (50 mg total) by mouth daily. 01/12/22 01/07/23 Yes Patwardhan, Manish J, MD  Calcium Carbonate (CALCIUM 500 PO) Take 1,000 mg by mouth in the morning and at bedtime.   Yes [provider]  cyanocobalamin (VITAMIN B12) 1000 MCG tablet Take 1,000 mcg by mouth daily.   Yes [provider]  diltiazem (TIAZAC) 120 MG 24 hr capsule Take 120 mg by mouth daily. 02/04/22  Yes [provider]  fludrocortisone (FLORINEF) 0.1 MG tablet Take 1 tablet (0.1 mg total) by mouth daily. 01/31/22 03/02/22 Yes Bonnita Hollow, MD  MAGNESIUM PO Take 250 mg by mouth daily.   Yes [provider]  omeprazole (PRILOSEC) 40 MG capsule  NEW PRESCRIPTION REQUEST: Omeprazole Dr 40 Mg Capsule - TAKE ONE CAPSULE BY MOUTH TWICE DAILY Patient taking differently: Take 40 mg by mouth daily. 12/24/21  Yes Bonnita Hollow, MD  oxyCODONE (OXY IR/ROXICODONE) 5 MG immediate release tablet Take 1 tablet (5 mg total) by mouth every 4 (four) hours as needed for moderate pain, severe pain or breakthrough pain. 01/31/22 03/02/22 Yes Bonnita Hollow, MD  rosuvastatin (CRESTOR) 20 MG tablet TAKE 1 TABLET BY MOUTH EVERY DAY Patient taking differently: Take 20 mg by mouth daily. 06/02/21  Yes Patwardhan, Manish J, MD  VITAMIN A PO Take 1 tablet by mouth daily.   Yes  [provider]  VITAMIN D PO Take 1 tablet by mouth daily.   Yes [provider]  apixaban (ELIQUIS) 2.5 MG TABS tablet Take 2.5 mg by mouth 2 (two) times daily. Pt approved to stop taking when having rectal bleeding. Currently not taking. Patient not taking: Reported on 02/24/2022    [provider]  diltiazem (CARDIZEM CD) 120 MG 24 hr capsule Take 1 capsule (120 mg total) by mouth daily. Patient not taking: Reported on 02/24/2022 01/12/22 01/07/23  Nigel Mormon, MD  nitroGLYCERIN (NITROSTAT) 0.4 MG SL tablet Place 0.4 mg under the tongue every 5 (five) minutes x 3 doses as needed for chest pain. 11/16/19   [provider]    Physical Exam: Vitals:   02/24/22 2045 02/24/22 2100 02/24/22 2139 02/25/22 0107  BP: (!) 165/88 (!) 169/87 (!) 185/75 (!) 155/106  Pulse: 86 81 80 84  Resp: (!) 21 (!) '27 17 19  '$ Temp:  98.1 F (36.7 C) 98.3 F (36.8 C) 98.9 F (37.2 C)  TempSrc:  Oral Oral Oral  SpO2: 99% 100% 100% 100%  Weight:      Height:       Constitutional: NAD, calm, thin chronically ill-appearing elderly male laying in bed with rigors Eyes: lids and conjunctivae normal ENMT: Mucous membranes are moist. Neck: normal, supple Respiratory: clear to auscultation bilaterally, no wheezing, no crackles. Normal respiratory effort. No accessory  muscle use.  Cardiovascular: Regular rate and rhythm, no murmurs / rubs / gallops. No extremity edema.   Abdomen: Soft, nondistended, diffuse voluntary guarding. Bowel sounds positive.  Musculoskeletal: no clubbing / cyanosis. No joint deformity upper and lower extremities.  Skin: no rashes, lesions, ulcers.  Neurologic: CN 2-12 grossly intact.  Able to lift all extremities but with significant tremors. Psychiatric: Alert and oriented x4 but appears disoriented at times when answering questions. data Reviewed:  See HPI  Assessment and Plan: * Weakness Likely continual decline from underlying malignancy and dehydration  Acute metabolic encephalopathy Appears disoriented at times likely due to dehyration  Colostomy care [facility]) -pt had diffuse tenderness and voluntary guarding on exam. Obtain Abd X-ray. Colostomy care per RN  PAF (paroxysmal atrial fibrillation) (HCC) Rate controlled -continue Eliquis, diltiazem, atenolol  Rectal adenocarcinoma metastatic to lung [facility]) Currently receiving SBRT for pulmonary nodules with last session on 02/10/2022.  Patient has been referred to palliative care outpatient and may soon transition to hospice.  Stage 3b chronic kidney disease (HCC) Creatinine is stable at 1.34.  Hypocalcemia Correct Ca of 7.4. -Give IV 1 g calcium gluconate.  Continue BID oral calcium supplementation. Check Mg and replete as needed.  S/P CABG x 4 Continue Eliquis  Hyperlipidemia Continue statin      Advance Care Planning:   Code Status: Full Code   Consults: none  Family Communication: none at bedside   Severity of Illness: The appropriate patient status for this patient is OBSERVATION. Observation status is judged to be reasonable and necessary in order to provide the required intensity of service to ensure the patient's safety. The patient's presenting symptoms, physical exam findings, and initial radiographic and laboratory data in the context of their  medical condition is felt to place them at decreased risk for further clinical deterioration. Furthermore, it is anticipated that the patient will be medically stable for discharge from the hospital within 2 midnights of admission.   Author: Orene Desanctis, DO 02/25/2022 1:28 AM  For on call review [email or web address].

## 2022-02-24 NOTE — ED Notes (Signed)
Secure message sent to Dr. Francia Greaves advising pt requesting nausea medication and family would like provider to come in and give update

## 2022-02-24 NOTE — ED Notes (Signed)
Receiving RN Milus Banister has agreed to accept [facility] once pt has arrived to inpatient unit, all questions and concerns address.

## 2022-02-24 NOTE — ED Provider Notes (Signed)
[facility] DEPT Provider Note   CSN: [id number] Arrival date & time: 02/24/22  1755     History  Chief Complaint  Patient presents with   Fatigue    Jared White is a 76 y.o. male.  76 year old male with prior medical history as detailed below presents for evaluation.  Patient arrives from home with EMS transport.  Patient with declining function over the last week.  Additional history obtained from Dr. Rollene Fare who is at bedside.  Dr. Rollene Fare is a good friend of the patient.  Patient previously had lived alone.  Over the last 1 to 2 weeks his ability to care for himself has decreased significantly.  Today patient was found covered in feces.  Patient apparently with plan to transition to hospice care over the next several days.  Patient without complaint of pain, nausea, shortness of breath, fever.  The history is provided by the patient, medical records and a friend.       Home Medications Prior to Admission medications   Medication Sig Start Date End Date Taking? Authorizing Provider  atenolol (TENORMIN) 50 MG tablet Take 1 tablet (50 mg total) by mouth daily. 01/12/22 01/07/23 Yes Patwardhan, Manish J, MD  Calcium Carbonate (CALCIUM 500 PO) Take 1,000 mg by mouth in the morning and at bedtime.   Yes [provider]  cyanocobalamin (VITAMIN B12) 1000 MCG tablet Take 1,000 mcg by mouth daily.   Yes [provider]  diltiazem (CARDIZEM CD) 120 MG 24 hr capsule Take 1 capsule (120 mg total) by mouth daily. 01/12/22 01/07/23 Yes Patwardhan, Manish J, MD  fludrocortisone (FLORINEF) 0.1 MG tablet Take 1 tablet (0.1 mg total) by mouth daily. 01/31/22 03/02/22 Yes Bonnita Hollow, MD  MAGNESIUM PO Take 250 mg by mouth daily.   Yes [provider]  omeprazole (PRILOSEC) 40 MG capsule NEW PRESCRIPTION REQUEST: Omeprazole Dr 40 Mg Capsule - TAKE ONE CAPSULE BY MOUTH TWICE DAILY Patient taking differently: Take 40 mg by mouth  daily. 12/24/21  Yes Bonnita Hollow, MD  oxyCODONE (OXY IR/ROXICODONE) 5 MG immediate release tablet Take 1 tablet (5 mg total) by mouth every 4 (four) hours as needed for moderate pain, severe pain or breakthrough pain. 01/31/22 03/02/22 Yes Bonnita Hollow, MD  rosuvastatin (CRESTOR) 20 MG tablet TAKE 1 TABLET BY MOUTH EVERY DAY Patient taking differently: Take 20 mg by mouth daily. 06/02/21  Yes Patwardhan, Manish J, MD  VITAMIN A PO Take 1 tablet by mouth daily.   Yes [provider]  VITAMIN D PO Take 1 tablet by mouth daily.   Yes [provider]  apixaban (ELIQUIS) 2.5 MG TABS tablet Take 2.5 mg by mouth 2 (two) times daily. Pt approved to stop taking when having rectal bleeding. Currently not taking. Patient not taking: Reported on 02/24/2022    [provider]  nitroGLYCERIN (NITROSTAT) 0.4 MG SL tablet Place 0.4 mg under the tongue every 5 (five) minutes x 3 doses as needed for chest pain. 11/16/19   [provider]      Allergies    Patient has no known allergies.    Review of Systems   Review of Systems  All other systems reviewed and are negative.   Physical Exam Updated Vital Signs BP (!) 169/87   Pulse 81   Temp 98.1 F (36.7 C) (Oral)   Resp (!) 27   Ht '5\' 8"'$  (1.727 m)   Wt 52.2 kg   SpO2 100%  BMI 17.49 kg/m  Physical Exam Vitals and nursing note reviewed.  Constitutional:      General: He is not in acute distress.    Appearance: He is well-developed.     Comments: Chronically ill in appearance  HENT:     Head: Normocephalic and atraumatic.  Eyes:     Conjunctiva/sclera: Conjunctivae normal.     Pupils: Pupils are equal, round, and reactive to light.  Cardiovascular:     Rate and Rhythm: Normal rate and regular rhythm.     Heart sounds: Normal heart sounds.  Pulmonary:     Effort: Pulmonary effort is normal. No respiratory distress.     Breath sounds: Normal breath sounds.  Abdominal:     General: There is no  distension.     Palpations: Abdomen is soft.     Tenderness: There is no abdominal tenderness.  Musculoskeletal:        General: No deformity. Normal range of motion.     Cervical back: Normal range of motion and neck supple.  Skin:    General: Skin is warm and dry.  Neurological:     General: No focal deficit present.     Mental Status: He is alert and oriented to person, place, and time.     ED Results / Procedures / Treatments   Labs (all labs ordered are listed, but only abnormal results are displayed) Labs Reviewed  CBC WITH DIFFERENTIAL/PLATELET - Abnormal; Notable for the following components:      Result Value   RBC 3.69 (*)    Hemoglobin 11.8 (*)    HCT 34.6 (*)    Lymphs Abs 0.6 (*)    All other components within normal limits  COMPREHENSIVE METABOLIC PANEL - Abnormal; Notable for the following components:   Glucose, Bld 161 (*)    BUN 30 (*)    Creatinine, Ser 1.34 (*)    Calcium 7.2 (*)    Albumin 3.3 (*)    GFR, Estimated 55 (*)    All other components within normal limits  LACTIC ACID, PLASMA - Abnormal; Notable for the following components:   Lactic Acid, Venous 3.6 (*)    All other components within normal limits  LACTIC ACID, PLASMA - Abnormal; Notable for the following components:   Lactic Acid, Venous 2.7 (*)    All other components within normal limits  TROPONIN I (HIGH SENSITIVITY) - Abnormal; Notable for the following components:   Troponin I (High Sensitivity) 18 (*)    All other components within normal limits  RESP PANEL BY RT-PCR (FLU A&B, COVID) [id number]  CULTURE, BLOOD (ROUTINE X 2)  CULTURE, BLOOD (ROUTINE X 2)  LIPASE, BLOOD  PROTIME-INR  URINALYSIS, ROUTINE W REFLEX MICROSCOPIC  TYPE AND SCREEN  TROPONIN I (HIGH SENSITIVITY)    EKG EKG Interpretation  Date/Time:  Thursday February 24 2022 18:30:06 EDT Ventricular Rate:  80 PR Interval:  155 QRS Duration: 99 QT Interval:  401 QTC Calculation: 463 R Axis:   37 Text  Interpretation: Sinus rhythm Atrial premature complexes Borderline repolarization abnormality Confirmed by Jared White [phone]) on 02/24/2022 6:37:12 PM  Radiology DG Chest Port 1 View  Result Date: 02/24/2022 CLINICAL DATA:  Stage IV colon cancer, weakness, loss of appetite EXAM: PORTABLE CHEST 1 VIEW COMPARISON:  11/30/2021 FINDINGS: Single frontal view of the chest demonstrates a stable right chest wall port. Postsurgical changes from CABG. Cardiac silhouette is unremarkable. No airspace disease, effusion, or pneumothorax. Prior healed right lateral fourth rib fracture. No acute  bony abnormality. IMPRESSION: 1. Stable chest, no acute process. Electronically Signed   By: Randa Ngo M.D.   On: 02/24/2022 19:30    Procedures Procedures    Medications Ordered in ED Medications  0.9 %  sodium chloride infusion (has no administration in time range)  sodium chloride 0.9 % bolus 1,000 mL (has no administration in time range)  sodium chloride 0.9 % bolus 500 mL (0 mLs Intravenous Stopped 02/24/22 1950)  ondansetron (ZOFRAN) injection 4 mg (4 mg Intravenous Given 02/24/22 2016)    ED Course/ Medical Decision Making/ A&P                           Medical Decision Making Amount and/or Complexity of Data Reviewed Labs: ordered. Radiology: ordered.  Risk Prescription drug management. Decision regarding hospitalization.    Medical Screen Complete  This patient presented to the ED with complaint of weakness, FTT.  This complaint involves an extensive number of treatment options. The initial differential diagnosis includes, but is not limited to, [place] abnormality, infection, etc.  This presentation is: Acute, Chronic, Self-Limited, Previously Undiagnosed, Uncertain Prognosis, Complicated, Systemic Symptoms, and Threat to Life/Bodily Function  Patient is presenting with declining function in the setting of metastatic cancer.  Patient likely would benefit from admission for  dehydration, IV fluids, and likely hospice placement.  Hospitalist service made aware of case and will evaluate for admission. Additional history obtained:  Additional history obtained from Friend External records from outside sources obtained and reviewed including prior ED visits and prior Inpatient records.    Lab Tests:  I ordered and personally interpreted labs.  The pertinent results include: Lactic acid, troponin, CBC, CMP, COVID, flu, UA   Imaging Studies ordered:  I ordered imaging studies including chest x-ray I independently visualized and interpreted obtained imaging which showed NAD I agree with the radiologist interpretation.   Cardiac Monitoring:  The patient was maintained on a cardiac monitor.  I personally viewed and interpreted the cardiac monitor which showed an underlying rhythm of: NSR   Medicines ordered:  I ordered medication including zofran  for nausea  Reevaluation of the patient after these medicines showed that the patient: improved   Problem List / ED Course:  Dehydration, FTT, hypocalcemia   Reevaluation:  After the interventions noted above, I reevaluated the patient and found that they have: stayed the same   Disposition:  After consideration of the diagnostic results and the patients response to treatment, I feel that the patent would benefit from admission.          Final Clinical Impression(s) / ED Diagnoses Final diagnoses:  Weakness    Rx / DC Orders ED Discharge Orders     None         Valarie Merino, MD 02/24/22 2111

## 2022-02-24 NOTE — ED Triage Notes (Signed)
Pt BIBA from home. Pt has stage 4 Colon cx. Pt has has increased weakness and loss of appetite.   Colostomy- no bag present on arrival  Aox4

## 2022-02-25 ENCOUNTER — Observation Stay (HOSPITAL_COMMUNITY): Payer: Medicare Other

## 2022-02-25 ENCOUNTER — Encounter (HOSPITAL_COMMUNITY): Payer: Self-pay | Admitting: Family Medicine

## 2022-02-25 DIAGNOSIS — G9341 Metabolic encephalopathy: Secondary | ICD-10-CM | POA: Diagnosis not present

## 2022-02-25 DIAGNOSIS — Z7189 Other specified counseling: Secondary | ICD-10-CM | POA: Diagnosis not present

## 2022-02-25 DIAGNOSIS — R531 Weakness: Secondary | ICD-10-CM | POA: Diagnosis not present

## 2022-02-25 DIAGNOSIS — E785 Hyperlipidemia, unspecified: Secondary | ICD-10-CM | POA: Diagnosis not present

## 2022-02-25 DIAGNOSIS — Z433 Encounter for attention to colostomy: Secondary | ICD-10-CM | POA: Diagnosis not present

## 2022-02-25 DIAGNOSIS — C20 Malignant neoplasm of rectum: Secondary | ICD-10-CM | POA: Diagnosis not present

## 2022-02-25 LAB — BASIC METABOLIC PANEL
Anion gap: 13 (ref 5–15)
Anion gap: 11 (ref 5–15)
BUN: 24 mg/dL — ABNORMAL HIGH (ref 8–23)
BUN: 24 mg/dL — ABNORMAL HIGH (ref 8–23)
CO2: 22 mmol/L (ref 22–32)
CO2: 24 mmol/L (ref 22–32)
Calcium: 7.4 mg/dL — ABNORMAL LOW (ref 8.9–10.3)
Calcium: 7.3 mg/dL — ABNORMAL LOW (ref 8.9–10.3)
Chloride: 105 mmol/L (ref 98–111)
Chloride: 103 mmol/L (ref 98–111)
Creatinine, Ser: 1.05 mg/dL (ref 0.61–1.24)
Creatinine, Ser: 1.15 mg/dL (ref 0.61–1.24)
GFR, Estimated: >60 mL/min (ref >60)
GFR, Estimated: >60 mL/min (ref >60)
Glucose, Bld: 139 mg/dL — ABNORMAL HIGH (ref 70–99)
Glucose, Bld: 107 mg/dL — ABNORMAL HIGH (ref 70–99)
Potassium: 2.8 mmol/L — ABNORMAL LOW (ref 3.5–5.1)
Potassium: 3 mmol/L — ABNORMAL LOW (ref 3.5–5.1)
Sodium: 140 mmol/L (ref 135–145)
Sodium: 138 mmol/L (ref 135–145)

## 2022-02-25 LAB — C DIFFICILE QUICK SCREEN W PCR REFLEX
C Diff antigen: NEGATIVE
C Diff interpretation: NOT DETECTED
C Diff toxin: NEGATIVE

## 2022-02-25 LAB — URINALYSIS, ROUTINE W REFLEX MICROSCOPIC
Bilirubin Urine: NEGATIVE
Glucose, UA: NEGATIVE mg/dL
Ketones, ur: 20 mg/dL — AB
Nitrite: NEGATIVE
Protein, ur: >=300 mg/dL — AB
Specific Gravity, Urine: 1.014 (ref 1.005–1.030)
WBC, UA: >50 WBC/hpf — ABNORMAL HIGH (ref 0–5)
pH: 6 (ref 5.0–8.0)

## 2022-02-25 LAB — CBC
HCT: 33.2 % — ABNORMAL LOW (ref 39.0–52.0)
Hemoglobin: 11.3 g/dL — ABNORMAL LOW (ref 13.0–17.0)
MCH: 31.3 pg (ref 26.0–34.0)
MCHC: 34 g/dL (ref 30.0–36.0)
MCV: 92 fL (ref 80.0–100.0)
Platelets: 132 10*3/uL — ABNORMAL LOW (ref 150–400)
RBC: 3.61 MIL/uL — ABNORMAL LOW (ref 4.22–5.81)
RDW: 13.1 % (ref 11.5–15.5)
WBC: 6.8 10*3/uL (ref 4.0–10.5)
nRBC: 0 % (ref 0.0–0.2)

## 2022-02-25 LAB — MAGNESIUM: Magnesium: <0.5 mg/dL — CL (ref 1.7–2.4)

## 2022-02-25 LAB — LACTIC ACID, PLASMA
Lactic Acid, Venous: 2 mmol/L (ref 0.5–1.9)
Lactic Acid, Venous: 2.2 mmol/L (ref 0.5–1.9)
Lactic Acid, Venous: 2.5 mmol/L (ref 0.5–1.9)
Lactic Acid, Venous: 3.9 mmol/L (ref 0.5–1.9)

## 2022-02-25 MED ORDER — ACETAMINOPHEN 325 MG PO TABS
650.0000 mg | ORAL_TABLET | ORAL | Status: DC | PRN
  Administered 2022-02-25 – 2022-03-03 (×4): 650 mg via ORAL
  Filled 2022-02-25 (×4): qty 2

## 2022-02-25 MED ORDER — TRAMADOL HCL 50 MG PO TABS: 50.0000 mg | ORAL_TABLET | Freq: Four times a day (QID) | ORAL | Status: DC | PRN

## 2022-02-25 MED ORDER — ORAL CARE MOUTH RINSE: 15.0000 mL | OROMUCOSAL | Status: DC | PRN

## 2022-02-25 MED ORDER — ENSURE ENLIVE PO LIQD
237.0000 mL | Freq: Two times a day (BID) | ORAL | Status: DC
  Administered 2022-02-25 – 2022-03-04 (×11): 237 mL via ORAL

## 2022-02-25 MED ORDER — HYDROXYZINE HCL 25 MG PO TABS
25.0000 mg | ORAL_TABLET | Freq: Three times a day (TID) | ORAL | Status: DC | PRN
  Administered 2022-02-25: 25 mg via ORAL
  Filled 2022-02-25: qty 1

## 2022-02-25 MED ORDER — POTASSIUM CHLORIDE CRYS ER 20 MEQ PO TBCR
40.0000 meq | EXTENDED_RELEASE_TABLET | Freq: Two times a day (BID) | ORAL | Status: DC
  Administered 2022-02-25 (×2): 40 meq via ORAL
  Filled 2022-02-25 (×2): qty 2

## 2022-02-25 MED ORDER — CHLORHEXIDINE GLUCONATE CLOTH 2 % EX PADS
6.0000 | MEDICATED_PAD | Freq: Every day | CUTANEOUS | Status: DC
  Administered 2022-02-26 – 2022-03-04 (×7): 6 via TOPICAL

## 2022-02-25 MED ORDER — ALUM & MAG HYDROXIDE-SIMETH 200-200-20 MG/5ML PO SUSP
30.0000 mL | ORAL | Status: DC | PRN
  Administered 2022-02-25 – 2022-03-01 (×2): 30 mL via ORAL
  Filled 2022-02-25 (×2): qty 30

## 2022-02-25 MED ORDER — KETOROLAC TROMETHAMINE 15 MG/ML IJ SOLN: 15.0000 mg | Freq: Three times a day (TID) | INTRAMUSCULAR | Status: AC | PRN

## 2022-02-25 MED ORDER — BANATROL TF EN LIQD
60.0000 mL | Freq: Two times a day (BID) | ENTERAL | Status: DC
  Administered 2022-02-28 – 2022-03-04 (×7): 60 mL via ORAL
  Filled 2022-02-25 (×17): qty 60

## 2022-02-25 MED ORDER — ONDANSETRON HCL 4 MG/2ML IJ SOLN
4.0000 mg | Freq: Four times a day (QID) | INTRAMUSCULAR | Status: DC | PRN
  Administered 2022-02-25: 4 mg via INTRAVENOUS
  Filled 2022-02-25 (×2): qty 2

## 2022-02-25 MED ORDER — LACTATED RINGERS IV SOLN: INTRAVENOUS | Status: AC

## 2022-02-25 MED ORDER — MAGNESIUM SULFATE 4 GM/100ML IV SOLN
4.0000 g | Freq: Once | INTRAVENOUS | Status: AC
  Administered 2022-02-25: 4 g via INTRAVENOUS
  Filled 2022-02-25: qty 100

## 2022-02-25 MED ORDER — POTASSIUM CHLORIDE 10 MEQ/100ML IV SOLN
10.0000 meq | INTRAVENOUS | Status: AC
  Administered 2022-02-25 (×6): 10 meq via INTRAVENOUS
  Filled 2022-02-25 (×6): qty 100

## 2022-02-25 MED ORDER — SODIUM CHLORIDE 0.9% FLUSH
10.0000 mL | INTRAVENOUS | Status: DC | PRN
  Administered 2022-02-26: 10 mL

## 2022-02-25 MED ORDER — B COMPLEX-C PO TABS
1.0000 | ORAL_TABLET | Freq: Every day | ORAL | Status: DC
  Administered 2022-02-25 – 2022-03-01 (×3): 1 via ORAL
  Filled 2022-02-25 (×5): qty 1

## 2022-02-25 MED ORDER — HALOPERIDOL 1 MG PO TABS
1.0000 mg | ORAL_TABLET | Freq: Three times a day (TID) | ORAL | Status: DC | PRN
  Administered 2022-02-26: 1 mg via ORAL
  Filled 2022-02-25 (×2): qty 1

## 2022-02-25 MED ORDER — SODIUM CHLORIDE 0.9% FLUSH
10.0000 mL | Freq: Two times a day (BID) | INTRAVENOUS | Status: DC
  Administered 2022-02-25 – 2022-03-04 (×14): 10 mL

## 2022-02-25 MED ORDER — ADULT MULTIVITAMIN W/MINERALS CH
1.0000 | ORAL_TABLET | Freq: Every day | ORAL | Status: DC
  Administered 2022-02-26 – 2022-03-01 (×4): 1 via ORAL
  Filled 2022-02-25 (×4): qty 1

## 2022-02-25 MED ORDER — LACTATED RINGERS IV BOLUS
1000.0000 mL | Freq: Once | INTRAVENOUS | Status: AC
  Administered 2022-02-25: 1000 mL via INTRAVENOUS

## 2022-02-25 NOTE — Significant Event (Signed)
Rapid Response Event Note   Low magnesium and potassium levels, concerned about needing higher level of care. Patient is alert and oriented x 4. Upon entering room, IV team RN present placing ultrasound guided IV. Patient reports discomfort with procedure due to arm positioning. Patient repeatedly states he is going to pass out, then closes his eyes, but continues talking and following commands. Assisted IV RN in securing IV and repositioned patient to help decrease discomfort. Reviewed chart including EKG in physical chart done prior to call. Recent vital signs within expected range and not indicative of change in condition. Patient acknowledges poor intake and appetite due to ongoing battle with cancer. No adverse lung or heart sounds. Only significant finding is in process of reconciliation with magnesium infusion running, second IV site,  and potassium infusions ordered.  Plan of Care:  Agree with telemetry orders to monitor for effects of electrolyte imbalance. Advised palliative care consult due to overall condition as documented by physicians.   Call Time: [place] Time: 2334 End Time: 0715  Selinda Michaels, RN

## 2022-02-25 NOTE — Progress Notes (Signed)
Pt calling out frequently complaining of vomiting on himself, went to pt room pt was clean, dry did not notice any vomiting. Pt then called out to report he was choking, went in to assess pt. Pt was able to carry out complete conversation, normal breathing, pt was a&o x4, normal skin color. Pt also reports blacking out when eyes closed, but continue to carryout complete conversation. Plan of care ongoing.

## 2022-02-25 NOTE — Assessment & Plan Note (Signed)
Continue statin. 

## 2022-02-25 NOTE — Assessment & Plan Note (Signed)
Correct Ca of 7.4. -Give IV 1 g calcium gluconate.  Continue BID oral calcium supplementation. Check Mg and replete as needed.

## 2022-02-25 NOTE — Assessment & Plan Note (Signed)
Currently receiving SBRT for pulmonary nodules with last session on 02/10/2022.  Patient has been referred to palliative care outpatient and may soon transition to hospice.

## 2022-02-25 NOTE — Assessment & Plan Note (Signed)
Likely continual decline from underlying malignancy and dehydration

## 2022-02-25 NOTE — Assessment & Plan Note (Signed)
-  pt had diffuse tenderness and voluntary guarding on exam. Obtain Abd X-ray. Colostomy care per RN

## 2022-02-25 NOTE — Assessment & Plan Note (Addendum)
Creatinine is stable at 1.34.

## 2022-02-25 NOTE — Progress Notes (Signed)
Date and time results received: 02/25/22 0538   Test: magnesium Critical Value: < 0.5 mg/dl  Name of Provider Notified: A. Zebedee Iba, NP  Orders Received? Or Actions Taken?: Orders received.

## 2022-02-25 NOTE — Assessment & Plan Note (Signed)
-   Continue Eliquis 

## 2022-02-25 NOTE — Assessment & Plan Note (Signed)
Appears disoriented at times likely due to dehyration

## 2022-02-25 NOTE — Plan of Care (Signed)
  Problem: Education: Goal: Knowledge of General Education information will improve Description Including pain rating scale, medication(s)/side effects and non-pharmacologic comfort measures Outcome: Progressing   Problem: Coping: Goal: Level of anxiety will decrease Outcome: Progressing   Problem: Elimination: Goal: Will not experience complications related to urinary retention Outcome: Progressing   Problem: Pain Managment: Goal: General experience of comfort will improve Outcome: Progressing   Problem: Safety: Goal: Ability to remain free from injury will improve Outcome: Progressing   

## 2022-02-25 NOTE — Assessment & Plan Note (Signed)
Rate controlled -continue Eliquis, diltiazem, atenolol

## 2022-02-25 NOTE — Consult Note (Signed)
Consultation Note Date: 02/25/2022   Patient Name: Jared White  DOB: [birth year]  MRN: [id number]  Age / Sex: 76 y.o., male  PCP: Bonnita Hollow, MD Referring Physician: Hosie Poisson, MD  Reason for Consultation:    HPI/Patient Profile: 76 y.o. male  with past medical history of rectal cancer diagnosed in March 2021- completed neoadjuvant chemotherapy and radiation, underwent resection of mass January 2022 with diverting ileostomy - pathology showed negative margins, positive lymph nodes, was recommended for surveillance unless he relapsed. He underwent ileostomy closure May 2022. In January of 2023 there was recurrence at the site of the coloanal anastomosis and mass was biopsied indicating high grade dysplasia in February 2023- resection and permanent colostomy were recommended and completed in August 2023 (delayed due to patient not completing bowel prep and due to hypocalcemia) in the interim he was found to have a lung nodule positive for metastatic colon in July 2023 and this has been treated with SBRT. He also had a small bowel excised during colostomy surgery in August but pathology resulted nonmalignant. Medical oncology did not recommend systemic chemotherapy unless he has further recurrence. He was  admitted on 02/24/2022 with weakness, dehydration, confusion, elevated lactic acid, significant electrolyte derangements. Having ongoing diarrhea through ostomy and C. Diff has been sent. Given his ongoing decline Palliative medicine consulted for goals of care.   Primary Decision Maker HCPOA - brother in law- Ricky  Discussion: Chart reviewed including notes from this admission, previous admission, operative and pathology reports from the last two years, imaging from this and previous encounters.  Met with patient. He was oriented to person and place, however, not to situation. He told me he was in the  hospital to have surgery to have his cancer "cut out". He did recall that he had surgery in August, but continued to state he was having more surgery today, he could not be oriented to his current reality despite attempts at education. Rithy also stated that he is going to hospice, but for "short term" hospice to "help me get better". Attempted to discuss hospice philosophy and services but Andranik was unable to comprehend. Given that he could not comprehend his current situation and options- I determined he did not have capacity at my visit in order to make his medical decisions.  Macarthur was able to tell me that his brother in law Audry Pili is his healthcare POA.  Prior to admission Berk was living at home alone. He used to enjoy building custom cars, has had his cars featured in magazines.  He has lost a significant amount of weight- his current weight is 114 lbs- down from 128 pounds in July.  I spoke to his brother in law Narka. Audry Pili confirmed that he is patient's HCPOA and will bring documents next weekend.  Ricky notes significant declines in patient's nutrition and functional level in the last 3 months. Audry Pili has read hospice information and he believes that the changes he has seen in [place] are indicating he is at end of life.  Audry Pili and Barclay have discussed hospice and hospice has also been recommended by their family friend Dr. Rollene Fare and they had agreed that hospice is appropriate for Dionisio, however, hospice is not able to provide in home 24 hour care, which patient needs in his current state. For now Audry Pili would prefer for patient to be discharged to SNF with Palliative, and then transition to hospice at home when Audry Pili is able to return from [place] and be with Giovani (likely in a week and a half or so).  We discussed code status and scope of care. Audry Pili agrees that DNR is in patient's best interest and agrees with DNR order. If patient were to worsen and look to need more aggressive care such as ICU,  IV pressors, etc- then choice would be to transition patient to full comfort measures only and allow for dying process.    SUMMARY OF RECOMMENDATIONS -Patient appears to have failure to thrive as sequelae to his cancer therapies and surgeries- it is unclear if there is active disease in his rectum, but given his lung nodule, and high risk for recurrence it is highly possible and is likely contributing to his decline and failure to thrive -Plan for now is conservative therapies, replace electrolytes treat what is treatable with conservative measures, but don't escalate care or transfer to ICU- if he decompensates then transition to full comfort measures only - If he is able to stabilize- then d/c to SNF rehab with Palliative with plan for eventual d/c home with hospice     Code Status/Advance Care Planning: DNR   Prognosis:   < 6 months  Discharge Planning: [place] for rehab with Palliative care service follow-up  Primary Diagnoses: Present on Admission:  CAD (coronary artery disease)  Hyperlipidemia  Hypocalcemia  Stage 3b chronic kidney disease ([place])  Rectal adenocarcinoma metastatic to lung (HCC)  PAF (paroxysmal atrial fibrillation) ([place])   Review of Systems  Physical Exam  Vital Signs: BP (!) 145/73 (BP Location: Right Arm)   Pulse 68   Temp 98 F (36.7 C) (Oral)   Resp 18   Ht 5' 8"  (1.727 m)   Wt 52.2 kg   SpO2 99%   BMI 17.49 kg/m  Pain Scale: 0-10   Pain Score: 2    SpO2: SpO2: 99 % O2 Device:SpO2: 99 % O2 Flow Rate: .   IO: Intake/output summary:  Intake/Output Summary (Last 24 hours) at 02/25/2022 1345 Last data filed at 02/25/2022 1100 Gross per 24 hour  Intake 907.51 ml  Output 1550 ml  Net -642.49 ml    LBM: Last BM Date : 02/25/22 Baseline Weight: Weight: 52.2 kg Most recent weight: Weight: 52.2 kg       Thank you for this consult. Palliative medicine will continue to follow and assist as needed.  Time Total: 90  minutes Greater than 50%  of this time was spent counseling and coordinating care related to the above assessment and plan.  Signed by: Mariana Kaufman, AGNP-C Palliative Medicine    Please contact Palliative Medicine Team phone at [phone] for questions and concerns.  For individual provider: See Shea Evans

## 2022-02-25 NOTE — Consult Note (Addendum)
Jared White Nurse ostomy consult note Consult requested for leaking colostomy pouch.  Pt is familiar to [facility] team from several previous admissions.  He had ostomy surgery performed in Jan 2022 and states he is independent with ostomy pouch application and emptying when at home.  Stoma type/location: Stoma is red and viable, 1 1/4 inches, slightly above skin level Peristomal assessment: intact skin surrounding Output: 50cc watery brown stool Ostomy pouching: 1pc. Applied barrier ring and one piece convex pouch.  4 sets of supplies ordered to the bedside for staff nurse use.  Use supplies: barrier rings, [facility] # [phone] and one piece convex pouches, [facility] # [id number]. Please re-consult if further assistance is needed.  Thank-you,  Jared Girt MSN, [place], [place], [place], [place]

## 2022-02-25 NOTE — Progress Notes (Signed)
[facility]                                                                               Maxon Kresse, is a 76 y.o. male, DOB - 12-Apr-1946, TML:[id number] Admit date - 02/24/2022    Outpatient Primary MD for the patient is Bonnita Hollow, MD  LOS - 0  days    Brief summary   [place] Deleo Hirschmann is a 76 y.o. male with medical history significant of CAD s/p CABG, CKD3A, HTN, HLD,A.fib on Eliquis, recurrent rectal adenocarcinoma with mets to lung s/p reversal of diverting ileostomy now with diverting loop colostomy presenting with decrease weakness and inability to care for himself.    Assessment & Plan    Assessment and Plan: Generalized weakness. Likely continual decline from underlying malignancy and dehydration. Replace electrolytes.  Check therapy evaluations.  Get palliative care consult for goals of care.  Dr Alen Blew is aware and agrees with the plan.   Acute metabolic encephalopathy Appears disoriented at times , has trouble with memory, . Probably has a component of dementia. Will need MRI brain to evaluate for stroke vs mets  Colostomy care ([place]) -pt had diffuse tenderness and voluntary guarding on exam. Patient reports leaking from the colostomy.  Colostomy care per RN  PAF (paroxysmal atrial fibrillation) (HCC) Rate controlled -continue Eliquis, diltiazem, atenolol  Rectal adenocarcinoma metastatic to lung (HCC) S/p LAR, Diverting loop colostomy Currently receiving SBRT for pulmonary nodules with last session on 02/10/2022.  Patient has been referred to palliative care outpatient and may soon transition to hospice. Palliative care consulted.   Stage 3b chronic kidney disease (HCC) Creatinine improved to 1.1 with IV fluids.   Hypocalcemia Replaced. Check albumin levels.   S/P CABG x 4 Continue Eliquis  Hyperlipidemia Continue statin   Lactic acidosis:  Suspect from dehydration.  Trend lactate.     Mild anemia and thrombocytopenia:   Monitor.    Elevated troponin from demand ischemia from dehydration.  Denies chest pain.  Severe hypokalemia and hypomagnesemia:  Replaced. Recheck levels in am.     Prolonged QT: Avoid QT prolonging drugs.  Repeat EKG in am.   Diarrhea / Loose bowel movements:  Check C diff PCR.      Estimated body mass index is 17.49 kg/m as calculated from the following:   Height as of this encounter: '5\' 8"'$  (1.727 m).   Weight as of this encounter: 52.2 kg.  Code Status: full code.  DVT Prophylaxis:  apixaban (ELIQUIS) tablet 2.5 mg Start: 02/25/22 0015 apixaban (ELIQUIS) tablet 2.5 mg   Level of Care: Level of care: Telemetry Family Communication: no family , discussed with Dr Rollene Fare patient's friend at bedside.   Disposition Plan:     Remains inpatient appropriate:  IV fluids.   Procedures:  MRI brain without contrast.  Consultants:   Palliative care.   Antimicrobials:   Anti-infectives (From admission, onward)    None        Medications  Scheduled Meds:  apixaban  2.5 mg Oral BID   atenolol  50 mg Oral Daily   calcium carbonate  1 tablet Oral BID WC   Chlorhexidine Gluconate Cloth  6 each Topical Daily   cyanocobalamin  1,000 mcg Oral Daily   diltiazem  120 mg Oral Daily   fludrocortisone  0.1 mg Oral Daily   magnesium oxide  200 mg Oral Daily   pantoprazole  40 mg Oral Daily   potassium chloride  40 mEq Oral BID   rosuvastatin  20 mg Oral Daily   sodium chloride flush  10-40 mL Intracatheter Q12H   Continuous Infusions:  lactated ringers 75 mL/hr at 02/25/22 0453   potassium chloride 10 mEq (02/25/22 0949)   PRN Meds:.alum & mag hydroxide-simeth, ondansetron (ZOFRAN) IV, oxyCODONE, sodium chloride flush    Subjective:   [name] was seen and examined today.  Reports leaking from the colostomy.   Objective:   Vitals:   02/25/22 0107 02/25/22 0455 02/25/22 0613 02/25/22 1022  BP: (!) 155/106 (!) 147/81 (!) 152/74 (!) 146/77  Pulse: 84 84 74  74  Resp: '19 17 17 17  '$ Temp: 98.9 F (37.2 C) 98.8 F (37.1 C) 98.3 F (36.8 C) 98 F (36.7 C)  TempSrc: Oral  Oral Oral  SpO2: 100% 99% 100% 100%  Weight:      Height:        Intake/Output Summary (Last 24 hours) at 02/25/2022 1139 Last data filed at 02/25/2022 1100 Gross per 24 hour  Intake 907.51 ml  Output 1550 ml  Net -642.49 ml   Filed Weights   02/24/22 1811  Weight: 52.2 kg     Exam  General exam: ill appearing gentleman, not in distress.  Respiratory system: Clear to auscultation. Respiratory effort normal. Cardiovascular system: S1 & S2 heard, RRR. No JVD,  No pedal edema. Gastrointestinal system: Abdomen is soft , mildly tender. Colostomy in place with liquid stool.  Central nervous system: Alert and oriented to person and place only. Extremities: no cyanosis.  Skin: No rashes, lesions or ulcers Psychiatry: Mood & affect appropriate.    Data Reviewed:  I have personally reviewed following labs and imaging studies   CBC Lab Results  Component Value Date   WBC 6.8 02/25/2022   RBC 3.61 (L) 02/25/2022   HGB 11.3 (L) 02/25/2022   HCT 33.2 (L) 02/25/2022   MCV 92.0 02/25/2022   MCH 31.3 02/25/2022   PLT 132 (L) 02/25/2022   MCHC 34.0 02/25/2022   RDW 13.1 02/25/2022   LYMPHSABS 0.6 (L) 02/24/2022   MONOABS 0.4 02/24/2022   EOSABS 0.0 02/24/2022   BASOSABS 0.0 25/85/2778     Last metabolic panel Lab Results  Component Value Date   NA 140 02/25/2022   K 2.8 (L) 02/25/2022   CL 105 02/25/2022   CO2 22 02/25/2022   BUN 24 (H) 02/25/2022   CREATININE 1.05 02/25/2022   GLUCOSE 139 (H) 02/25/2022   GFRNONAA >60 02/25/2022   GFRAA >60 01/28/2020   CALCIUM 7.4 (L) 02/25/2022   PHOS 2.1 (L) 11/23/2021   PROT 6.6 02/24/2022   ALBUMIN 3.3 (L) 02/24/2022   BILITOT 0.8 02/24/2022   ALKPHOS 66 02/24/2022   AST 31 02/24/2022   ALT 15 02/24/2022   ANIONGAP 13 02/25/2022    CBG (last 3)  No results for input(s): "GLUCAP" in the last 72 hours.     Coagulation Profile: Recent Labs  Lab 02/24/22 1827  INR 1.1     Radiology Studies: DG Abd Portable 1V  Result Date: 02/25/2022 CLINICAL DATA:  Sudden onset abdominal pain, initial encounter EXAM: PORTABLE ABDOMEN - 1 VIEW COMPARISON:  11/21/2021 FINDINGS: Scattered large and small  bowel gas is noted. No obstructive changes are seen. Colostomy is noted in the left mid abdomen. Degenerative changes of lumbar spine are noted and stable. IMPRESSION: No acute abnormality noted. Electronically Signed   By: Inez Catalina M.D.   On: 02/25/2022 01:54   DG Chest Port 1 View  Result Date: 02/24/2022 CLINICAL DATA:  Stage IV colon cancer, weakness, loss of appetite EXAM: PORTABLE CHEST 1 VIEW COMPARISON:  11/30/2021 FINDINGS: Single frontal view of the chest demonstrates a stable right chest wall port. Postsurgical changes from CABG. Cardiac silhouette is unremarkable. No airspace disease, effusion, or pneumothorax. Prior healed right lateral fourth rib fracture. No acute bony abnormality. IMPRESSION: 1. Stable chest, no acute process. Electronically Signed   By: Randa Ngo M.D.   On: 02/24/2022 19:30       Hosie Poisson M.D. [facility] 02/25/2022, 11:39 AM  Available via Epic secure chat 7am-7pm After 7 pm, please refer to night coverage provider listed on amion.

## 2022-02-25 NOTE — Progress Notes (Signed)
Initial Nutrition Assessment  DOCUMENTATION CODES:   Severe malnutrition in context of chronic illness, Underweight  INTERVENTION:   -Ensure Plus High Protein po BID, each supplement provides 350 kcal and 20 grams of protein.   -Multivitamin with minerals daily  -B complex w/ Vitamin C daily  -Banatrol TF fiber supplement BID, each provides 45 kcals, 2g protein and 5g soluble fiber to aid diarrhea.  NUTRITION DIAGNOSIS:   Severe Malnutrition related to chronic illness (recurrent rectal cancer) as evidenced by severe fat depletion, severe muscle depletion, percent weight loss.  GOAL:   Patient will meet greater than or equal to 90% of their needs  MONITOR:   PO intake, Supplement acceptance, Labs, Weight trends, I & O's  REASON FOR ASSESSMENT:   Consult Assessment of nutrition requirement/status  ASSESSMENT:   76 y.o. male with medical history significant of CAD s/p CABG, CKD3A, HTN, HLD,A.fib on Eliquis, recurrent rectal adenocarcinoma with mets to lung s/p reversal of diverting ileostomy now with diverting loop colostomy presenting with decrease weakness and inability to care for himself.  Patient in room, looking at menu for dinner. Pt thinking about eating some spaghetti. Pt with prominent tremors, states these started around 3-4  months ago. Pt states his ostomy has been leaking, he is having loose stools. Pt states he drinks Ensure and takes multiple vitamin supplements (A, D, C, E, B-12, calcium, Mg). Pt states he was prescribed these supplements following labs.   Pt denies any issues with swallowing or chewing.   Pt reports UBW of 197 lbs. Per review of weight records, pt at most has weighed ~170 lbs in 2021.  Per weight records, pt has lost 22 lbs since 6/12 (16% wt loss x 4 months, significant for time frame).  Medications: OSCAL, Vitamin B-12, MAG-OX, KLOR-CON, IV Mg sulfate, Lactated ringers, KCl, Zofran  Labs reviewed: Low K   NUTRITION - FOCUSED PHYSICAL  EXAM:  Flowsheet Row Most Recent Value  Orbital Region Moderate depletion  Upper Arm Region Severe depletion  Thoracic and Lumbar Region Severe depletion  Buccal Region Moderate depletion  Temple Region Severe depletion  Clavicle Bone Region Severe depletion  Clavicle and Acromion Bone Region Severe depletion  Scapular Bone Region Severe depletion  Dorsal Hand Severe depletion  Patellar Region Severe depletion  Anterior Thigh Region Severe depletion  Posterior Calf Region Moderate depletion  Edema (RD Assessment) None  Hair Reviewed  [thin]  Eyes Reviewed  Mouth Reviewed  Skin Reviewed  [red spots on LEs, pale]  Nails Reviewed       Diet Order:   Diet Order             Diet regular Room service appropriate? Yes; Fluid consistency: Thin  Diet effective now                   EDUCATION NEEDS:   No education needs have been identified at this time  Skin:  Skin Assessment: Reviewed RN Assessment  Last BM:  10/6 -type 7  Height:   Ht Readings from Last 1 Encounters:  02/24/22 '5\' 8"'$  (1.727 m)    Weight:   Wt Readings from Last 1 Encounters:  02/24/22 52.2 kg    BMI:  Body mass index is 17.49 kg/m.  Estimated Nutritional Needs:   Kcal:  2100-2300  Protein:  100-115g  Fluid:  2.1L/day  Clayton Bibles, MS, RD, LDN Inpatient Clinical Dietitian Contact information available via Amion

## 2022-02-26 DIAGNOSIS — E43 Unspecified severe protein-calorie malnutrition: Secondary | ICD-10-CM | POA: Diagnosis present

## 2022-02-26 DIAGNOSIS — I2489 Other forms of acute ischemic heart disease: Secondary | ICD-10-CM | POA: Diagnosis present

## 2022-02-26 DIAGNOSIS — R627 Adult failure to thrive: Secondary | ICD-10-CM | POA: Diagnosis present

## 2022-02-26 DIAGNOSIS — C20 Malignant neoplasm of rectum: Secondary | ICD-10-CM | POA: Diagnosis present

## 2022-02-26 DIAGNOSIS — I130 Hypertensive heart and chronic kidney disease with heart failure and stage 1 through stage 4 chronic kidney disease, or unspecified chronic kidney disease: Secondary | ICD-10-CM | POA: Diagnosis present

## 2022-02-26 DIAGNOSIS — Z933 Colostomy status: Secondary | ICD-10-CM | POA: Diagnosis not present

## 2022-02-26 DIAGNOSIS — G9341 Metabolic encephalopathy: Secondary | ICD-10-CM | POA: Diagnosis present

## 2022-02-26 DIAGNOSIS — Z1152 Encounter for screening for COVID-19: Secondary | ICD-10-CM | POA: Diagnosis not present

## 2022-02-26 DIAGNOSIS — Z681 Body mass index (BMI) 19 or less, adult: Secondary | ICD-10-CM | POA: Diagnosis not present

## 2022-02-26 DIAGNOSIS — D649 Anemia, unspecified: Secondary | ICD-10-CM | POA: Diagnosis present

## 2022-02-26 DIAGNOSIS — Z66 Do not resuscitate: Secondary | ICD-10-CM | POA: Diagnosis present

## 2022-02-26 DIAGNOSIS — F05 Delirium due to known physiological condition: Secondary | ICD-10-CM | POA: Diagnosis present

## 2022-02-26 DIAGNOSIS — I480 Paroxysmal atrial fibrillation: Secondary | ICD-10-CM | POA: Diagnosis present

## 2022-02-26 DIAGNOSIS — Z7189 Other specified counseling: Secondary | ICD-10-CM | POA: Diagnosis not present

## 2022-02-26 DIAGNOSIS — E785 Hyperlipidemia, unspecified: Secondary | ICD-10-CM | POA: Diagnosis present

## 2022-02-26 DIAGNOSIS — Z951 Presence of aortocoronary bypass graft: Secondary | ICD-10-CM | POA: Diagnosis not present

## 2022-02-26 DIAGNOSIS — R531 Weakness: Secondary | ICD-10-CM | POA: Diagnosis present

## 2022-02-26 DIAGNOSIS — Z515 Encounter for palliative care: Secondary | ICD-10-CM | POA: Diagnosis not present

## 2022-02-26 DIAGNOSIS — C7800 Secondary malignant neoplasm of unspecified lung: Secondary | ICD-10-CM | POA: Diagnosis present

## 2022-02-26 DIAGNOSIS — F03B0 Unspecified dementia, moderate, without behavioral disturbance, psychotic disturbance, mood disturbance, and anxiety: Secondary | ICD-10-CM | POA: Diagnosis present

## 2022-02-26 DIAGNOSIS — E872 Acidosis, unspecified: Secondary | ICD-10-CM | POA: Diagnosis present

## 2022-02-26 DIAGNOSIS — E860 Dehydration: Secondary | ICD-10-CM | POA: Diagnosis present

## 2022-02-26 DIAGNOSIS — Z433 Encounter for attention to colostomy: Secondary | ICD-10-CM | POA: Diagnosis not present

## 2022-02-26 DIAGNOSIS — N1832 Chronic kidney disease, stage 3b: Secondary | ICD-10-CM | POA: Diagnosis present

## 2022-02-26 DIAGNOSIS — Z7901 Long term (current) use of anticoagulants: Secondary | ICD-10-CM | POA: Diagnosis not present

## 2022-02-26 DIAGNOSIS — D696 Thrombocytopenia, unspecified: Secondary | ICD-10-CM | POA: Diagnosis present

## 2022-02-26 LAB — COMPREHENSIVE METABOLIC PANEL
ALT: 15 U/L (ref 0–44)
AST: 39 U/L (ref 15–41)
Albumin: 3.1 g/dL — ABNORMAL LOW (ref 3.5–5.0)
Alkaline Phosphatase: 62 U/L (ref 38–126)
Anion gap: 8 (ref 5–15)
BUN: 25 mg/dL — ABNORMAL HIGH (ref 8–23)
CO2: 21 mmol/L — ABNORMAL LOW (ref 22–32)
Calcium: 7.5 mg/dL — ABNORMAL LOW (ref 8.9–10.3)
Chloride: 109 mmol/L (ref 98–111)
Creatinine, Ser: 1.07 mg/dL (ref 0.61–1.24)
GFR, Estimated: >60 mL/min (ref >60)
Glucose, Bld: 101 mg/dL — ABNORMAL HIGH (ref 70–99)
Potassium: 3.8 mmol/L (ref 3.5–5.1)
Sodium: 138 mmol/L (ref 135–145)
Total Bilirubin: 1 mg/dL (ref 0.3–1.2)
Total Protein: 5.9 g/dL — ABNORMAL LOW (ref 6.5–8.1)

## 2022-02-26 LAB — CBC WITH DIFFERENTIAL/PLATELET
Abs Immature Granulocytes: 0.06 10*3/uL (ref 0.00–0.07)
Basophils Absolute: 0 10*3/uL (ref 0.0–0.1)
Basophils Relative: 0 %
Eosinophils Absolute: 0.1 10*3/uL (ref 0.0–0.5)
Eosinophils Relative: 1 %
HCT: 29.9 % — ABNORMAL LOW (ref 39.0–52.0)
Hemoglobin: 10.2 g/dL — ABNORMAL LOW (ref 13.0–17.0)
Immature Granulocytes: 1 %
Lymphocytes Relative: 16 %
Lymphs Abs: 1.4 10*3/uL (ref 0.7–4.0)
MCH: 32.2 pg (ref 26.0–34.0)
MCHC: 34.1 g/dL (ref 30.0–36.0)
MCV: 94.3 fL (ref 80.0–100.0)
Monocytes Absolute: 0.9 10*3/uL (ref 0.1–1.0)
Monocytes Relative: 11 %
Neutro Abs: 6 10*3/uL (ref 1.7–7.7)
Neutrophils Relative %: 71 %
Platelets: 123 10*3/uL — ABNORMAL LOW (ref 150–400)
RBC: 3.17 MIL/uL — ABNORMAL LOW (ref 4.22–5.81)
RDW: 13.5 % (ref 11.5–15.5)
WBC: 8.5 10*3/uL (ref 4.0–10.5)
nRBC: 0 % (ref 0.0–0.2)

## 2022-02-26 LAB — LACTIC ACID, PLASMA
Lactic Acid, Venous: 1.3 mmol/L (ref 0.5–1.9)
Lactic Acid, Venous: 1.2 mmol/L (ref 0.5–1.9)

## 2022-02-26 LAB — MAGNESIUM: Magnesium: 1.5 mg/dL — ABNORMAL LOW (ref 1.7–2.4)

## 2022-02-26 LAB — PHOSPHORUS: Phosphorus: 2.3 mg/dL — ABNORMAL LOW (ref 2.5–4.6)

## 2022-02-26 MED ORDER — LORAZEPAM 2 MG/ML IJ SOLN
1.0000 mg | Freq: Four times a day (QID) | INTRAMUSCULAR | Status: DC | PRN
  Administered 2022-02-26 – 2022-02-28 (×2): 2 mg via INTRAVENOUS
  Filled 2022-02-26 (×2): qty 1

## 2022-02-26 MED ORDER — LACTATED RINGERS IV SOLN: INTRAVENOUS | Status: DC

## 2022-02-26 MED ORDER — POTASSIUM PHOSPHATES 15 MMOLE/5ML IV SOLN
30.0000 mmol | Freq: Once | INTRAVENOUS | Status: AC
  Administered 2022-02-26: 30 mmol via INTRAVENOUS
  Filled 2022-02-26: qty 10

## 2022-02-26 MED ORDER — HALOPERIDOL LACTATE 5 MG/ML IJ SOLN: 1.0000 mg | Freq: Four times a day (QID) | INTRAMUSCULAR | Status: DC | PRN

## 2022-02-26 MED ORDER — LORAZEPAM 2 MG/ML IJ SOLN
INTRAMUSCULAR | Status: AC
  Filled 2022-02-26: qty 1

## 2022-02-26 MED ORDER — MAGNESIUM SULFATE 2 GM/50ML IV SOLN
2.0000 g | Freq: Once | INTRAVENOUS | Status: AC
  Administered 2022-02-26: 2 g via INTRAVENOUS
  Filled 2022-02-26: qty 50

## 2022-02-26 NOTE — Progress Notes (Signed)
Pt resting comfortably in bed after ativan and reassurance from friends. He was finally able to get some sleep after two days of being up.   Elder Bobby Rumpf, is good friend of the patient, was helpful at bedside in calming Mr. Route down. His number is [phone] and he said it was ok to call him at any tome.   Order placed for EKG, passed on to night shift to obtain when appropriate. Pt remains off tele, MD is aware.

## 2022-02-26 NOTE — Progress Notes (Signed)
OT Cancellation Note  Patient Details Name: Jared White MRN: [id number] DOB: 05-21-1946   Cancelled Treatment:    Reason Eval/Treat Not Completed: Other (comment). When therapist approached room patient's nurse in room, patient on the phone with 911 asking for help. Patient confused and cannot be reoriented. Patient then pulled his IV out. Patient currently too agitated to attempt OT evaluation. Will follow up as able.  [name] 02/26/2022, 4:10 PM

## 2022-02-26 NOTE — Progress Notes (Signed)
Pt became increasingly confused and agitated around 1400. Pt was insisting that he go home and stated he was going to call the police. He called 911 from his cell phone and took out his PIV. Sitter, RN and NT at bedside. Attempted to reassure and redirect patient but were unsuccessful. MD Karleen Hampshire paged, order for IV ativan obtained. Order for soft wrist restraints placed, but not initiated at this time. Family members and friends came to visit and were helpful in redirecting the patient.   Sitter remains at bedside. Will continue to monitor.

## 2022-02-26 NOTE — Evaluation (Signed)
Physical Therapy Evaluation Patient Details Name: Jared White MRN: [id number] DOB: 1945-09-21 Today's Date: 02/26/2022  History of Present Illness  Pt is a 76 y.o. male admitted 02/24/22 for generalized weakness, likely continual decline from underlying malignancy and dehydration. PMHx significant for but not limited to:  recurrent rectal adenocarcinoma with mets to lung s/p reversal of diverting ileostomy now with diverting loop colostomy, chronic diarrhea, hypocalcemia, CAD s/p CABG, CKD, HTN, HLD, tobacco use.  Clinical Impression  Pt admitted with above diagnosis.  Pt currently with functional limitations due to the deficits listed below (see PT Problem List). Pt will benefit from skilled PT to increase their independence and safety with mobility to allow discharge to the venue listed below.  Pt presents with decreased cognition, poor safety awareness, and poor balance. Pt poor historian however per chart, pt from home alone.  Pt would benefit from SNF upon d/c.        Recommendations for follow up therapy are one component of a multi-disciplinary discharge planning process, led by the attending physician.  Recommendations may be updated based on patient status, additional functional criteria and insurance authorization.  Follow Up Recommendations Skilled nursing-short term rehab (<3 hours/day) Can patient physically be transported by private vehicle: Yes    Assistance Recommended at Discharge Frequent or constant Supervision/Assistance  Patient can return home with the following  A little help with walking and/or transfers;A little help with bathing/dressing/bathroom;Assistance with cooking/housework;Direct supervision/assist for medications management;Help with stairs or ramp for entrance;Assist for transportation    Equipment Recommendations None recommended by PT  Recommendations for Other Services       Functional Status Assessment Patient has had a recent decline in their  functional status and demonstrates the ability to make significant improvements in function in a reasonable and predictable amount of time.     Precautions / Restrictions Precautions Precautions: Fall Precaution Comments: colostomy      Mobility  Bed Mobility Overal bed mobility: Needs Assistance Bed Mobility: Supine to Sit     Supine to sit: Min guard, HOB elevated     General bed mobility comments: safety cues for exiting bed    Transfers Overall transfer level: Needs assistance Equipment used: Rolling walker (2 wheels) Transfers: Sit to/from Stand Sit to Stand: Min assist, +2 safety/equipment           General transfer comment: posterior bias upon standing requiring assist; pt impulsive    Ambulation/Gait Ambulation/Gait assistance: Min assist, +2 safety/equipment Gait Distance (Feet): 80 Feet Assistive device: Rolling walker (2 wheels) Gait Pattern/deviations: Step-through pattern, Decreased stride length       General Gait Details: very unsteady requiring occasional assist  Stairs            Wheelchair Mobility    Modified Rankin (Stroke Patients Only)       Balance Overall balance assessment: Needs assistance         Standing balance support: Bilateral upper extremity supported, Reliant on assistive device for balance Standing balance-Leahy Scale: Poor                               Pertinent Vitals/Pain Pain Assessment Pain Assessment: No/denies pain    Home Living Family/patient expects to be discharged to:: Private residence Living Arrangements: Alone               Home Equipment: Conservation officer, nature (2 wheels) Additional Comments: pt poor historian    Prior Function  Prior Level of Function : Independent/Modified Independent                     Hand Dominance        Extremity/Trunk Assessment        Lower Extremity Assessment Lower Extremity Assessment: Generalized weakness        Communication   Communication: No difficulties  Cognition Arousal/Alertness: Awake/alert Behavior During Therapy: Restless Overall Cognitive Status: No family/caregiver present to determine baseline cognitive functioning Area of Impairment: Following commands, Problem solving, Safety/judgement                       Following Commands: Follows one step commands inconsistently Safety/Judgement: Decreased awareness of safety, Decreased awareness of deficits   Problem Solving: Slow processing, Difficulty sequencing, Requires verbal cues General Comments: tangential and nonsensical most of the time        General Comments      Exercises     Assessment/Plan    PT Assessment Patient needs continued PT services  PT Problem List Decreased strength;Decreased activity tolerance;Decreased balance;Decreased mobility;Decreased knowledge of precautions;Decreased knowledge of use of DME;Decreased safety awareness;Decreased cognition       PT Treatment Interventions DME instruction;Gait training;Balance training;Therapeutic exercise;Functional mobility training;Therapeutic activities;Patient/family education;Neuromuscular re-education    PT Goals (Current goals can be found in the Care Plan section)  Acute Rehab PT Goals PT Goal Formulation: Patient unable to participate in goal setting Time For Goal Achievement: 03/12/22 Potential to Achieve Goals: Good    Frequency Min 2X/week     Co-evaluation               AM-PAC PT "6 Clicks" Mobility  Outcome Measure Help needed turning from your back to your side while in a flat bed without using bedrails?: A Little Help needed moving from lying on your back to sitting on the side of a flat bed without using bedrails?: A Little Help needed moving to and from a bed to a chair (including a wheelchair)?: A Little Help needed standing up from a chair using your arms (e.g., wheelchair or bedside chair)?: A Little Help needed to walk  in hospital room?: A Little Help needed climbing 3-5 steps with a railing? : A Lot 6 Click Score: 17    End of Session Equipment Utilized During Treatment: Gait belt Activity Tolerance: Patient tolerated treatment well Patient left: with nursing/sitter in room (in bathroom with sitter)   PT Visit Diagnosis: Difficulty in walking, not elsewhere classified (R26.2);Unsteadiness on feet (R26.81)    Time: 0865-7846 PT Time Calculation (min) (ACUTE ONLY): 10 min   Charges:   PT Evaluation $PT Eval Low Complexity: 1 Low         Kati PT, DPT Physical Therapist Acute Rehabilitation Services Preferred contact method: Secure Chat Weekend Pager Only: [phone] Office: [place] 02/26/2022, 3:46 PM

## 2022-02-26 NOTE — Progress Notes (Signed)
[facility]                                                                               [name], is a 76 y.o. male, DOB - 1945/12/21, [id number] Admit date - 02/24/2022    Outpatient Primary MD for the patient is Jared Hollow, MD  LOS - 0  days    Brief summary   Jared White is a 76 y.o. male with medical history significant of CAD s/p CABG, CKD3A, HTN, HLD,A.fib on Eliquis, recurrent rectal adenocarcinoma with mets to lung s/p reversal of diverting ileostomy now with diverting loop colostomy presenting with decrease weakness and inability to care for himself.  He was admitted for failure to thrive , therapy evaluations and acute metabolic encephalopathy.    Assessment & Plan    Assessment and Plan: Generalized weakness/ Failure to thrive:. Likely continual decline from underlying malignancy and dehydration. Replaced electrolytes.  Awaiting therapy evaluations for disposition.  Palliative care consulted , appreciate recommendations. Will monitor.  Dr Alen Blew is aware and agrees with the plan.   Acute metabolic encephalopathy Appears disoriented at times  and delirious with sun downing. He also has trouble with memory, . Probably has a component of dementia.  Unable to give IV haldol due to QTC, prn IV ativan ordered.  Restraints ordered.  MRI brain is negative for stroke.   Colostomy care [facility]) Colostomy care per RN  PAF (paroxysmal atrial fibrillation) (HCC) Rate controlled -continue Eliquis, diltiazem, atenolol  Rectal adenocarcinoma metastatic to lung (HCC) S/p LAR, Diverting loop colostomy S/p  SBRT for pulmonary nodules with last session on 02/10/2022.  Patient has been referred to palliative care outpatient and may soon transition to hospice. Palliative care consulted.   Stage 3b chronic kidney disease (HCC) Creatinine improved to 1.1 with IV fluids.   Hypocalcemia Replaced.  Albumin level is 3.1    Hypokalemia , Hypomagnesemia  and hypophosphatemia; Replaced.   S/P CABG x 4 Continue Eliquis  Hyperlipidemia Continue statin   Lactic acidosis:  Suspect from dehydration.  Trend lactate.     Mild anemia and thrombocytopenia:  Monitor.    Elevated troponin from demand ischemia from dehydration.  Denies chest pain.  Prolonged QT: Avoid QT prolonging drugs.  Repeat EKG in am.   Diarrhea / Loose bowel movements:  C diff pcr is negative.  Interventions: Ensure Enlive (each supplement provides 350kcal and 20 grams of protein), MVI  Estimated body mass index is 17.49 kg/m as calculated from the following:   Height as of this encounter: '5\' 8"'$  (1.727 m).   Weight as of this encounter: 52.2 kg.  Code Status: full code.  DVT Prophylaxis:  apixaban (ELIQUIS) tablet 2.5 mg Start: 02/25/22 0015 apixaban (ELIQUIS) tablet 2.5 mg   Level of Care: Level of care: Med-Surg Family Communication:  none at bedside.   Disposition Plan:     Remains inpatient appropriate:  Therapy evaluations, agitation.   Procedures:  MRI brain without contrast.  Consultants:   Palliative care.   Antimicrobials:   Anti-infectives (From admission, onward)    None        Medications  Scheduled Meds:  apixaban  2.5  mg Oral BID   atenolol  50 mg Oral Daily   B-complex with vitamin C  1 tablet Oral Daily   calcium carbonate  1 tablet Oral BID WC   Chlorhexidine Gluconate Cloth  6 each Topical Daily   cyanocobalamin  1,000 mcg Oral Daily   diltiazem  120 mg Oral Daily   feeding supplement  237 mL Oral BID BM   fiber supplement (BANATROL TF)  60 mL Oral BID   fludrocortisone  0.1 mg Oral Daily   magnesium oxide  200 mg Oral Daily   multivitamin with minerals  1 tablet Oral Daily   pantoprazole  40 mg Oral Daily   rosuvastatin  20 mg Oral Daily   sodium chloride flush  10-40 mL Intracatheter Q12H   Continuous Infusions:  lactated ringers 75 mL/hr at 02/26/22 1316   potassium PHOSPHATE IVPB (in mmol) 30 mmol  (02/26/22 1158)   PRN Meds:.acetaminophen, alum & mag hydroxide-simeth, haloperidol, hydrOXYzine, ketorolac, ondansetron (ZOFRAN) IV, mouth rinse, oxyCODONE, sodium chloride flush, traMADol    Subjective:   Jared White was seen and examined today.  Confused, memory issues.   Objective:   Vitals:   02/25/22 1239 02/25/22 2220 02/26/22 0935 02/26/22 1359  BP: (!) 145/73 (!) 159/75 (!) 142/77 (!) 147/69  Pulse: 68 79  60  Resp: '18 18  20  '$ Temp: 98 F (36.7 C) 99 F (37.2 C)  98 F (36.7 C)  TempSrc: Oral Oral  Oral  SpO2: 99% 97%  100%  Weight:      Height:        Intake/Output Summary (Last 24 hours) at 02/26/2022 1412 Last data filed at 02/26/2022 1000 Gross per 24 hour  Intake 2267.09 ml  Output --  Net 2267.09 ml    Filed Weights   02/24/22 1811  Weight: 52.2 kg     Exam  General exam: ill appearing gentleman, confused.  Respiratory system: Clear to auscultation. Respiratory effort normal. Cardiovascular system: S1 & S2 heard, RRR. No JVD, No pedal edema. Gastrointestinal system: Abdomen is nondistended, soft and nontender. Normal bowel sounds heard. Central nervous system: Alert , confused. Able to move all extremities.  Extremities: no pedal edema.  Skin: No rashes,  Psychiatry: unable to assess.    Data Reviewed:  I have personally reviewed following labs and imaging studies   CBC Lab Results  Component Value Date   WBC 8.5 02/26/2022   RBC 3.17 (L) 02/26/2022   HGB 10.2 (L) 02/26/2022   HCT 29.9 (L) 02/26/2022   MCV 94.3 02/26/2022   MCH 32.2 02/26/2022   PLT 123 (L) 02/26/2022   MCHC 34.1 02/26/2022   RDW 13.5 02/26/2022   LYMPHSABS 1.4 02/26/2022   MONOABS 0.9 02/26/2022   EOSABS 0.1 02/26/2022   BASOSABS 0.0 32/20/2542     Last metabolic panel Lab Results  Component Value Date   NA 138 02/26/2022   K 3.8 02/26/2022   CL 109 02/26/2022   CO2 21 (L) 02/26/2022   BUN 25 (H) 02/26/2022   CREATININE 1.07 02/26/2022   GLUCOSE 101 (H)  02/26/2022   GFRNONAA >60 02/26/2022   GFRAA >60 01/28/2020   CALCIUM 7.5 (L) 02/26/2022   PHOS 2.3 (L) 02/26/2022   PROT 5.9 (L) 02/26/2022   ALBUMIN 3.1 (L) 02/26/2022   BILITOT 1.0 02/26/2022   ALKPHOS 62 02/26/2022   AST 39 02/26/2022   ALT 15 02/26/2022   ANIONGAP 8 02/26/2022    CBG (last 3)  No results for  input(s): "GLUCAP" in the last 72 hours.    Coagulation Profile: Recent Labs  Lab 02/24/22 1827  INR 1.1      Radiology Studies: MR BRAIN WO CONTRAST  Result Date: 02/25/2022 CLINICAL DATA:  Disoriented, memory difficulties. EXAM: MRI HEAD WITHOUT CONTRAST TECHNIQUE: Multiplanar, multiecho pulse sequences of the brain and surrounding structures were obtained without intravenous contrast. COMPARISON:  CT head 11/21/2021 FINDINGS: The patient could not be completed due to patient comfort. Axial DWI and sagittal T1 sequences were obtained. Brain: There is no diffusion signal abnormality to suggest acute infarct. There is no definite evidence of intracranial hemorrhage or extra-axial fluid collection on the provided sequences Parenchymal volume is normal. The ventricles are normal in size. There is no large mass lesion or midline shift. Vascular: Not well assessed. Skull and upper cervical spine: Normal marrow signal. Sinuses/Orbits: Grossly unremarkable. Other: None. IMPRESSION: Incomplete study with only axial DWI and sagittal T1 sequences obtained. No evidence of acute infarct. No large mass lesion or midline shift. Repeat study may be considered when the patient can tolerate. Electronically Signed   By: Valetta Mole M.D.   On: 02/25/2022 14:30   DG Abd Portable 1V  Result Date: 02/25/2022 CLINICAL DATA:  Sudden onset abdominal pain, initial encounter EXAM: PORTABLE ABDOMEN - 1 VIEW COMPARISON:  11/21/2021 FINDINGS: Scattered large and small bowel gas is noted. No obstructive changes are seen. Colostomy is noted in the left mid abdomen. Degenerative changes of lumbar spine  are noted and stable. IMPRESSION: No acute abnormality noted. Electronically Signed   By: Inez Catalina M.D.   On: 02/25/2022 01:54   DG Chest Port 1 View  Result Date: 02/24/2022 CLINICAL DATA:  Stage IV colon cancer, weakness, loss of appetite EXAM: PORTABLE CHEST 1 VIEW COMPARISON:  11/30/2021 FINDINGS: Single frontal view of the chest demonstrates a stable right chest wall port. Postsurgical changes from CABG. Cardiac silhouette is unremarkable. No airspace disease, effusion, or pneumothorax. Prior healed right lateral fourth rib fracture. No acute bony abnormality. IMPRESSION: 1. Stable chest, no acute process. Electronically Signed   By: Randa Ngo M.D.   On: 02/24/2022 19:30       Hosie Poisson M.D. [facility] 02/26/2022, 2:12 PM  Available via Epic secure chat 7am-7pm After 7 pm, please refer to night coverage provider listed on amion.

## 2022-02-27 LAB — BASIC METABOLIC PANEL
Anion gap: 5 (ref 5–15)
BUN: 18 mg/dL (ref 8–23)
CO2: 24 mmol/L (ref 22–32)
Calcium: 8.2 mg/dL — ABNORMAL LOW (ref 8.9–10.3)
Chloride: 109 mmol/L (ref 98–111)
Creatinine, Ser: 0.94 mg/dL (ref 0.61–1.24)
GFR, Estimated: >60 mL/min (ref >60)
Glucose, Bld: 173 mg/dL — ABNORMAL HIGH (ref 70–99)
Potassium: 3.3 mmol/L — ABNORMAL LOW (ref 3.5–5.1)
Sodium: 138 mmol/L (ref 135–145)

## 2022-02-27 LAB — PHOSPHORUS: Phosphorus: 2.5 mg/dL (ref 2.5–4.6)

## 2022-02-27 LAB — MAGNESIUM: Magnesium: 1.6 mg/dL — ABNORMAL LOW (ref 1.7–2.4)

## 2022-02-27 MED ORDER — QUETIAPINE FUMARATE 25 MG PO TABS
25.0000 mg | ORAL_TABLET | Freq: Every day | ORAL | Status: DC
  Administered 2022-03-01 – 2022-03-03 (×3): 25 mg via ORAL
  Filled 2022-02-27 (×5): qty 1

## 2022-02-27 MED ORDER — MAGNESIUM SULFATE 4 GM/100ML IV SOLN
4.0000 g | Freq: Once | INTRAVENOUS | Status: AC
  Administered 2022-02-27: 4 g via INTRAVENOUS
  Filled 2022-02-27: qty 100

## 2022-02-27 MED ORDER — POTASSIUM CHLORIDE CRYS ER 20 MEQ PO TBCR
40.0000 meq | EXTENDED_RELEASE_TABLET | Freq: Once | ORAL | Status: AC
  Administered 2022-02-27: 40 meq via ORAL
  Filled 2022-02-27: qty 2

## 2022-02-27 NOTE — Progress Notes (Signed)
[facility]                                                                               [name], is a 76 y.o. male, DOB - 09/14/1945, [id number] Admit date - 02/24/2022    Outpatient Primary MD for the patient is Bonnita Hollow, MD  LOS - 1  days    Brief summary   Jared White is a 76 y.o. male with medical history significant of CAD s/p CABG, CKD3A, HTN, HLD,A.fib on Eliquis, recurrent rectal adenocarcinoma with mets to lung s/p reversal of diverting ileostomy now with diverting loop colostomy presenting with decrease weakness and inability to care for himself.  He was admitted for failure to thrive , therapy evaluations and acute metabolic encephalopathy.    Assessment & Plan    Assessment and Plan: Generalized weakness/ Failure to thrive:. Likely continual decline from underlying malignancy and dehydration. Replaced electrolytes.  Palliative care consulted , appreciate recommendations. Will monitor.  Dr Alen Blew is aware and agrees with the plan.  Therapy eval recommending SNF.   Acute metabolic encephalopathy Appears disoriented at times  and delirious with sun downing. He also has trouble with memory, . Probably has moderate dementia.  Unable to give IV haldol due to QTC, prn IV ativan ordered.  Restraints ordered.  MRI brain is negative for stroke.   Colostomy care [facility]) Colostomy care per RN  PAF (paroxysmal atrial fibrillation) (HCC) Rate controlled -continue Eliquis, diltiazem, atenolol  Rectal adenocarcinoma metastatic to lung (HCC) S/p LAR, Diverting loop colostomy S/p  SBRT for pulmonary nodules with last session on 02/10/2022.  Patient has been referred to palliative care outpatient and may soon transition to hospice at home after discharge from SNF. Marland Kitchen Palliative care consulted.   Stage 3b chronic kidney disease (HCC) Creatinine improved to 1.1 with IV fluids.   Hypocalcemia Replaced.  Albumin level is 3.1    Hypokalemia ,  Hypomagnesemia and hypophosphatemia; Replaced. Repeat level in am.   S/P CABG x 4 Continue Eliquis  Hyperlipidemia Continue statin   Lactic acidosis:  Suspect from dehydration.  Trend lactate.     Mild anemia and thrombocytopenia:  Monitor.    Elevated troponin from demand ischemia from dehydration.  Denies chest pain.  Prolonged QT: Avoid QT prolonging drugs.  Repeat EKG in am.   Diarrhea / Loose bowel movements:  C diff pcr is negative.  MUCH IMPROVED.  Interventions: Ensure Enlive (each supplement provides 350kcal and 20 grams of protein), MVI  Estimated body mass index is 17.49 kg/m as calculated from the following:   Height as of this encounter: '5\' 8"'$  (1.727 m).   Weight as of this encounter: 52.2 kg.  Code Status: full code.  DVT Prophylaxis:  apixaban (ELIQUIS) tablet 2.5 mg Start: 02/25/22 0015 apixaban (ELIQUIS) tablet 2.5 mg   Level of Care: Level of care: Med-Surg Family Communication:  none at bedside.   Disposition Plan:     Remains inpatient appropriate:  Therapy evaluations, agitation.   Procedures:  MRI brain without contrast.  Consultants:   Palliative care.   Antimicrobials:   Anti-infectives (From admission, onward)    None  Medications  Scheduled Meds:  apixaban  2.5 mg Oral BID   atenolol  50 mg Oral Daily   B-complex with vitamin C  1 tablet Oral Daily   calcium carbonate  1 tablet Oral BID WC   Chlorhexidine Gluconate Cloth  6 each Topical Daily   cyanocobalamin  1,000 mcg Oral Daily   diltiazem  120 mg Oral Daily   feeding supplement  237 mL Oral BID BM   fiber supplement (BANATROL TF)  60 mL Oral BID   fludrocortisone  0.1 mg Oral Daily   magnesium oxide  200 mg Oral Daily   multivitamin with minerals  1 tablet Oral Daily   pantoprazole  40 mg Oral Daily   QUEtiapine  25 mg Oral QHS   rosuvastatin  20 mg Oral Daily   sodium chloride flush  10-40 mL Intracatheter Q12H   Continuous Infusions:  magnesium  sulfate bolus IVPB 50 mL/hr at 02/27/22 1705   PRN Meds:.acetaminophen, alum & mag hydroxide-simeth, hydrOXYzine, LORazepam, ondansetron (ZOFRAN) IV, mouth rinse, oxyCODONE, sodium chloride flush, traMADol    Subjective:   Jared White was seen and examined today.  Confused, not agitated.   Objective:   Vitals:   02/26/22 0935 02/26/22 1359 02/26/22 2153 02/27/22 0809  BP: (!) 142/77 (!) 147/69 125/73   Pulse:  60 (!) 53 65  Resp:  20    Temp:  98 F (36.7 C) 97.8 F (36.6 C)   TempSrc:  Oral Oral   SpO2:  100% 100%   Weight:      Height:        Intake/Output Summary (Last 24 hours) at 02/27/2022 1745 Last data filed at 02/27/2022 1705 Gross per 24 hour  Intake 2151.41 ml  Output 475 ml  Net 1676.41 ml    Filed Weights   02/24/22 1811  Weight: 52.2 kg     Exam  General exam: Appears calm and comfortable  Respiratory system: Clear to auscultation. Respiratory effort normal. Cardiovascular system: S1 & S2 heard, irregular No JVD,  No pedal edema. Gastrointestinal system: Abdomen is nondistended, soft and nontender.Normal bowel sounds heard. Central nervous system: Alert , confused.  Extremities: Symmetric 5 x 5 power. Skin: No rashes Psychiatry: Mood is appropriate.      Data Reviewed:  I have personally reviewed following labs and imaging studies   CBC Lab Results  Component Value Date   WBC 8.5 02/26/2022   RBC 3.17 (L) 02/26/2022   HGB 10.2 (L) 02/26/2022   HCT 29.9 (L) 02/26/2022   MCV 94.3 02/26/2022   MCH 32.2 02/26/2022   PLT 123 (L) 02/26/2022   MCHC 34.1 02/26/2022   RDW 13.5 02/26/2022   LYMPHSABS 1.4 02/26/2022   MONOABS 0.9 02/26/2022   EOSABS 0.1 02/26/2022   BASOSABS 0.0 75/64/3329     Last metabolic panel Lab Results  Component Value Date   NA 138 02/27/2022   K 3.3 (L) 02/27/2022   CL 109 02/27/2022   CO2 24 02/27/2022   BUN 18 02/27/2022   CREATININE 0.94 02/27/2022   GLUCOSE 173 (H) 02/27/2022   GFRNONAA >60 02/27/2022    GFRAA >60 01/28/2020   CALCIUM 8.2 (L) 02/27/2022   PHOS 2.5 02/27/2022   PROT 5.9 (L) 02/26/2022   ALBUMIN 3.1 (L) 02/26/2022   BILITOT 1.0 02/26/2022   ALKPHOS 62 02/26/2022   AST 39 02/26/2022   ALT 15 02/26/2022   ANIONGAP 5 02/27/2022    CBG (last 3)  No results for input(s): "GLUCAP"  in the last 72 hours.    Coagulation Profile: Recent Labs  Lab 02/24/22 1827  INR 1.1      Radiology Studies: No results found.     Hosie Poisson M.D. [facility] 02/27/2022, 5:45 PM  Available via Epic secure chat 7am-7pm After 7 pm, please refer to night coverage provider listed on amion.

## 2022-02-27 NOTE — Evaluation (Signed)
Occupational Therapy Evaluation Patient Details Name: Jared White MRN: [id number] DOB: [birth year] Today's Date: 02/27/2022   History of Present Illness Pt is a 76 y.o. male admitted 02/24/22 for generalized weakness, likely continual decline from underlying malignancy and dehydration. PMHx significant for but not limited to:  recurrent rectal adenocarcinoma with mets to lung s/p reversal of diverting ileostomy now with diverting loop colostomy, chronic diarrhea, hypocalcemia, CAD s/p CABG, CKD, HTN, HLD, tobacco use.   Clinical Impression   Jared White is a 76 y/o male with above medical history. Prior to hospitalization patient Mod I for ADLs. On evaluation patient presents with generalized weakness, decreased safety awareness, and decreased cognition. Patient supervision for supine to sit and min guard for sit to stand. Patient lost balance twice while ambulating to sink Patient able to brush teeth and wash face in standing with min guard for balance. Patient min A for ambulation to/from bathroom and was able to manage colostomy bag without assistance. Patient was intermittently confused stating he was outside during therapy, but able to perform functional tasks. Patient min guard for most ADLs, however has decreased safety awareness and loss of balance that would not allow him to return home safely alone. Recommend SNF.      Recommendations for follow up therapy are one component of a multi-disciplinary discharge planning process, led by the attending physician.  Recommendations may be updated based on patient status, additional functional criteria and insurance authorization.   Follow Up Recommendations  Skilled nursing-short term rehab (<3 hours/day)    Assistance Recommended at Discharge Frequent or constant Supervision/Assistance  Patient can return home with the following A little help with walking and/or transfers;A little help with bathing/dressing/bathroom;Assistance with  cooking/housework;Assist for transportation;Direct supervision/assist for financial management;Help with stairs or ramp for entrance;Direct supervision/assist for medications management    Functional Status Assessment  Patient has had a recent decline in their functional status and demonstrates the ability to make significant improvements in function in a reasonable and predictable amount of time.  Equipment Recommendations  None recommended by OT    Recommendations for Other Services       Precautions / Restrictions Precautions Precautions: Fall Precaution Comments: colostomy Restrictions Weight Bearing Restrictions: No      Mobility Bed Mobility Overal bed mobility: Needs Assistance Bed Mobility: Supine to Sit     Supine to sit: Supervision          Transfers Overall transfer level: Needs assistance Equipment used: Rolling walker (2 wheels) Transfers: Sit to/from Stand Sit to Stand: Min guard           General transfer comment: pt impulsive; min a for ambulate due to loss of balance      Balance Overall balance assessment: Needs assistance Sitting-balance support: Feet supported, Bilateral upper extremity supported Sitting balance-Leahy Scale: Fair     Standing balance support: Bilateral upper extremity supported, Reliant on assistive device for balance, During functional activity Standing balance-Leahy Scale: Poor Standing balance comment: patient is unsteady, and unaware of his balance deficits                           ADL either performed or assessed with clinical judgement   ADL Overall ADL's : Needs assistance/impaired Eating/Feeding: Independent;Sitting   Grooming: Wash/dry hands;Wash/dry face;Oral care;Sitting;Min guard   Upper Body Bathing: Min guard;Sitting   Lower Body Bathing: Min guard;Sit to/from stand   Upper Body Dressing : Set up;Sitting   Lower Body  Dressing: Sit to/from stand;Min guard   Toilet Transfer: Min Oncologist and Hygiene: Min guard       Functional mobility during ADLs: Minimal assistance General ADL Comments: patient lost balance during ambulation twice     Vision Baseline Vision/History: 1 Wears glasses       Perception     Praxis      Pertinent Vitals/Pain Pain Assessment Pain Assessment: No/denies pain     Hand Dominance Right   Extremity/Trunk Assessment Upper Extremity Assessment Upper Extremity Assessment: RUE deficits/detail;LUE deficits/detail RUE Deficits / Details: WFL-ROM RUE Sensation: WNL RUE Coordination: WNL LUE Deficits / Details: WFL-ROM LUE Sensation: WNL LUE Coordination: WNL   Lower Extremity Assessment Lower Extremity Assessment: Defer to PT evaluation       Communication Communication Communication: No difficulties   Cognition Arousal/Alertness: Awake/alert Behavior During Therapy: WFL for tasks assessed/performed Overall Cognitive Status: No family/caregiver present to determine baseline cognitive functioning Area of Impairment: Safety/judgement, Orientation, Awareness                 Orientation Level: Disoriented to, Situation     Following Commands: Follows one step commands consistently Safety/Judgement: Decreased awareness of safety, Decreased awareness of deficits Awareness: Emergent Problem Solving: Requires verbal cues General Comments: oriented to self and place, not situation. Able to answer where he was, however during therapy stated he was outside. Able to perform functional activities but not only OAx2     General Comments       Exercises     Shoulder Instructions      Home Living Family/patient expects to be discharged to:: Private residence Living Arrangements: Alone                           Home Equipment: Conservation officer, nature (2 wheels)   Additional Comments: pt poor historian      Prior Functioning/Environment Prior Level of Function : Independent/Modified  Independent                        OT Problem List: Decreased strength;Decreased cognition;Decreased safety awareness;Impaired balance (sitting and/or standing)      OT Treatment/Interventions: Self-care/ADL training;Therapeutic exercise;Patient/family education;Therapeutic activities;DME and/or AE instruction    OT Goals(Current goals can be found in the care plan section) Acute Rehab OT Goals Patient Stated Goal: to get better OT Goal Formulation: With patient Time For Goal Achievement: 03/13/22 Potential to Achieve Goals: Good  OT Frequency: Min 2X/week    Co-evaluation              AM-PAC OT "6 Clicks" Daily Activity     Outcome Measure Help from another person eating meals?: None Help from another person taking care of personal grooming?: A Little Help from another person toileting, which includes using toliet, bedpan, or urinal?: A Little Help from another person bathing (including washing, rinsing, drying)?: A Little Help from another person to put on and taking off regular upper body clothing?: A Little Help from another person to put on and taking off regular lower body clothing?: A Little 6 Click Score: 19   End of Session Equipment Utilized During Treatment: Gait belt Nurse Communication:  (patient did well with therapy. require 1 assist)  Activity Tolerance: Patient tolerated treatment well Patient left: in bed;with call bell/phone within reach;with bed alarm set  OT Visit Diagnosis: Unsteadiness on feet (R26.81);Muscle weakness (generalized) (M62.81);Other symptoms and signs involving cognitive function  Time: 6116-4353 OT Time Calculation (min): 44 min Charges:  OT General Charges $OT Visit: 1 Visit OT Evaluation $OT Eval Low Complexity: 1 Low OT Treatments $Self Care/Home Management : 23-37 mins  Charlann Lange, OTS Acute rehab services   Charlann Lange 02/27/2022, 2:37 PM

## 2022-02-28 ENCOUNTER — Other Ambulatory Visit: Payer: Self-pay | Admitting: Family Medicine

## 2022-02-28 ENCOUNTER — Ambulatory Visit: Payer: Medicare Other | Admitting: Family Medicine

## 2022-02-28 DIAGNOSIS — Z66 Do not resuscitate: Secondary | ICD-10-CM

## 2022-02-28 DIAGNOSIS — Z515 Encounter for palliative care: Secondary | ICD-10-CM

## 2022-02-28 DIAGNOSIS — R627 Adult failure to thrive: Principal | ICD-10-CM

## 2022-02-28 DIAGNOSIS — R42 Dizziness and giddiness: Secondary | ICD-10-CM

## 2022-02-28 LAB — MAGNESIUM: Magnesium: 2.3 mg/dL (ref 1.7–2.4)

## 2022-02-28 LAB — BASIC METABOLIC PANEL
Anion gap: 3 — ABNORMAL LOW (ref 5–15)
BUN: 21 mg/dL (ref 8–23)
CO2: 25 mmol/L (ref 22–32)
Calcium: 8.3 mg/dL — ABNORMAL LOW (ref 8.9–10.3)
Chloride: 109 mmol/L (ref 98–111)
Creatinine, Ser: 1.05 mg/dL (ref 0.61–1.24)
GFR, Estimated: >60 mL/min (ref >60)
Glucose, Bld: 109 mg/dL — ABNORMAL HIGH (ref 70–99)
Potassium: 4.2 mmol/L (ref 3.5–5.1)
Sodium: 137 mmol/L (ref 135–145)

## 2022-02-28 NOTE — Progress Notes (Signed)
[facility] [address] [employer] [facility]) Hospital Liaison note:  This is a pending outpatient-based Palliative Care patient. Will continue to follow for disposition.  Please call with any outpatient palliative questions or concerns.  Thank you, Lorelee Market, LPN [facility] Liaison [phone]

## 2022-02-28 NOTE — Progress Notes (Signed)
                                                     Palliative Care Progress Note, Assessment & Plan   Patient Name: Jared White       Date: 02/28/2022 DOB: 10-04-1945  Age: 76 y.o. MRN#: [id number] Attending Physician: Jared Poisson, MD Primary Care Physician: Jared Hollow, MD Admit Date: 02/24/2022  After reviewing the patient's chart, I confirmed with Jared White that patient was a no-show for his outpatient palliative visit on 02/07/2022 but has another appt scheduled for 03/07/22.  I discussed with the Jared White that patient is currently not able to engage in decision making independently.  I conveyed that patient's brother-in-law Jared White is HCPOA and decision maker for patient.  Advised that future outpatient palliative appointments and discussions for patient's goals of care should be held/communicated with Jared White. His info is below:  Jared White [phone]  PMT will continue to shadow the patient's chart and will re-engage at patient/family's request, if goals change, or if patient's health deteriorates during hospitalization.   Jared Ilsa Iha, FNP-BC Palliative Medicine Team Team Phone # [phone]  NO CHARGE

## 2022-02-28 NOTE — Progress Notes (Signed)
[facility] [address] [employer] [address] [facility]) Hospital Liaison Note   Received request from Transitions of Care, [facility], for hospice services at home after discharge.    Spoke with patient's HCPOA, [name] to initiate education related to hospice philosophy, services, and team approach to care. [name] verbalized understanding of information given. Per discussion, the plan is for patient to discharge home via private car once cleared to DC.    DME needs discussed. Patient has the following equipment in the home: Walker and cane Patient requests the following equipment for delivery: None at this time.  Address verified and is correct in the chart.    Please send signed and completed DNR home with patient/family. Please provide prescriptions at discharge as needed to ensure ongoing symptom management.    [facility] information and contact numbers given to family & above information shared with TOC.   Please call with any questions/concerns.    Thank you for the opportunity to participate in this patient's care.   Zigmund Gottron  [facility] Liaison  [phone]

## 2022-02-28 NOTE — Progress Notes (Signed)
[facility]                                                                               [name], is a 76 y.o. male, DOB - 1945-05-24, DTO:[id number] Admit date - 02/24/2022    Outpatient Primary MD for the patient is Bonnita Hollow, MD  LOS - 2  days    Brief summary   Jared White is a 76 y.o. male with medical history significant of CAD s/p CABG, CKD3A, HTN, HLD,A.fib on Eliquis, recurrent rectal adenocarcinoma with mets to lung s/p reversal of diverting ileostomy now with diverting loop colostomy presenting with decrease weakness and inability to care for himself.  He was admitted for failure to thrive , therapy evaluations and acute metabolic encephalopathy.  Pt seen and examined at bedside. Has moderate dementia, very confused and restless.    Assessment & Plan    Assessment and Plan: Generalized weakness/ Failure to thrive:. Likely continual decline from underlying malignancy and dehydration. Replaced electrolytes.  Palliative care consulted , appreciate recommendations. Will monitor.  Dr Alen Blew is aware and agrees with the plan.  Therapy eval recommending SNF.   Acute metabolic encephalopathy Appears disoriented at times  and delirious with sun downing in the setting of moderate dementia.  Unable to give IV haldol due to QTC, prn IV ativan ordered.  Restraints ordered. Sitter ordered.  MRI brain is negative for stroke.   Colostomy care [facility]) Colostomy care per RN  PAF (paroxysmal atrial fibrillation) (HCC) Rate controlled -continue Eliquis, diltiazem, atenolol  Rectal adenocarcinoma metastatic to lung (HCC) S/p LAR, Diverting loop colostomy S/p  SBRT for pulmonary nodules with last session on 02/10/2022.  Patient has been referred to palliative care outpatient and may soon transition to hospice at home after discharge from SNF. Marland Kitchen Palliative care consulted.   Stage 3b chronic kidney disease (HCC) Creatinine improved to 1. With IV fluids.   IV fluids stopped.   Hypocalcemia Replaced.  Albumin level is 3.1    Hypokalemia , Hypomagnesemia and hypophosphatemia; Replaced. Repeat level in am.  Hcpoa requested all labs to be discontinued.   S/P CABG x 4 Continue Eliquis  Hyperlipidemia Continue statin   Lactic acidosis:  Suspect from dehydration.    Mild anemia and thrombocytopenia:  Monitor.    Elevated troponin from demand ischemia from dehydration.  Denies chest pain.  Prolonged QT: Avoid QT prolonging drugs.    Diarrhea / Loose bowel movements:  C diff pcr is negative.  MUCH IMPROVED.  Interventions: Ensure Enlive (each supplement provides 350kcal and 20 grams of protein), MVI  Estimated body mass index is 17.49 kg/m as calculated from the following:   Height as of this encounter: '5\' 8"'$  (1.727 m).   Weight as of this encounter: 52.2 kg.  Code Status: full code.  DVT Prophylaxis:  apixaban (ELIQUIS) tablet 2.5 mg Start: 02/25/22 0015 apixaban (ELIQUIS) tablet 2.5 mg   Level of Care: Level of care: Med-Surg Family Communication:  none at bedside.   Disposition Plan:     Remains inpatient appropriate:  awaiting SNF with hospice.   Procedures:  MRI brain without contrast.  Consultants:   Palliative care.  Antimicrobials:   Anti-infectives (From admission, onward)    None        Medications  Scheduled Meds:  apixaban  2.5 mg Oral BID   atenolol  50 mg Oral Daily   B-complex with vitamin C  1 tablet Oral Daily   calcium carbonate  1 tablet Oral BID WC   Chlorhexidine Gluconate Cloth  6 each Topical Daily   cyanocobalamin  1,000 mcg Oral Daily   diltiazem  120 mg Oral Daily   feeding supplement  237 mL Oral BID BM   fiber supplement (BANATROL TF)  60 mL Oral BID   fludrocortisone  0.1 mg Oral Daily   magnesium oxide  200 mg Oral Daily   multivitamin with minerals  1 tablet Oral Daily   pantoprazole  40 mg Oral Daily   QUEtiapine  25 mg Oral QHS   rosuvastatin  20 mg Oral Daily    sodium chloride flush  10-40 mL Intracatheter Q12H   Continuous Infusions:   PRN Meds:.acetaminophen, alum & mag hydroxide-simeth, hydrOXYzine, LORazepam, ondansetron (ZOFRAN) IV, mouth rinse, oxyCODONE, sodium chloride flush, traMADol    Subjective:   Jared White was seen and examined today.  Confused.   Objective:   Vitals:   02/27/22 2334 02/28/22 0609 02/28/22 0810 02/28/22 1317  BP: 139/84 (!) 144/67  (!) 142/72  Pulse: 74 (!) 59 65 62  Resp: '17 20  18  '$ Temp: 97.7 F (36.5 C) 97.8 F (36.6 C)  (!) 97.5 F (36.4 C)  TempSrc: Oral Oral  Oral  SpO2: 100% 99%  100%  Weight:      Height:        Intake/Output Summary (Last 24 hours) at 02/28/2022 1649 Last data filed at 02/28/2022 1400 Gross per 24 hour  Intake 948.81 ml  Output 200 ml  Net 748.81 ml    Filed Weights   02/24/22 1811  Weight: 52.2 kg     Exam  General exam: Appears calm and comfortable  Respiratory system: Clear to auscultation. Respiratory effort normal. Cardiovascular system: S1 & S2 heard, RRR. No JVD, No pedal edema. Gastrointestinal system: Abdomen is nondistended, soft and nontender. Normal bowel sounds heard. Central nervous system: Alert and oriented. No focal neurological deficits. Extremities: Symmetric 5 x 5 power. Skin: No rashes, lesions or ulcers Psychiatry: Mood & affect appropriate.       Data Reviewed:  I have personally reviewed following labs and imaging studies   CBC Lab Results  Component Value Date   WBC 8.5 02/26/2022   RBC 3.17 (L) 02/26/2022   HGB 10.2 (L) 02/26/2022   HCT 29.9 (L) 02/26/2022   MCV 94.3 02/26/2022   MCH 32.2 02/26/2022   PLT 123 (L) 02/26/2022   MCHC 34.1 02/26/2022   RDW 13.5 02/26/2022   LYMPHSABS 1.4 02/26/2022   MONOABS 0.9 02/26/2022   EOSABS 0.1 02/26/2022   BASOSABS 0.0 00/76/2263     Last metabolic panel Lab Results  Component Value Date   NA 137 02/28/2022   K 4.2 02/28/2022   CL 109 02/28/2022   CO2 25 02/28/2022    BUN 21 02/28/2022   CREATININE 1.05 02/28/2022   GLUCOSE 109 (H) 02/28/2022   GFRNONAA >60 02/28/2022   GFRAA >60 01/28/2020   CALCIUM 8.3 (L) 02/28/2022   PHOS 2.5 02/27/2022   PROT 5.9 (L) 02/26/2022   ALBUMIN 3.1 (L) 02/26/2022   BILITOT 1.0 02/26/2022   ALKPHOS 62 02/26/2022   AST 39 02/26/2022   ALT 15 02/26/2022  ANIONGAP 3 (L) 02/28/2022    CBG (last 3)  No results for input(s): "GLUCAP" in the last 72 hours.    Coagulation Profile: Recent Labs  Lab 02/24/22 1827  INR 1.1      Radiology Studies: No results found.     Hosie Poisson M.D. [facility] 02/28/2022, 4:49 PM  Available via Epic secure chat 7am-7pm After 7 pm, please refer to night coverage provider listed on amion.

## 2022-02-28 NOTE — TOC Progression Note (Signed)
Transition of Care [facility]) - Progression Note    Patient Details  Name: Jared White MRN: [id number] Date of Birth: 1946-02-26  [facility] CM/SW Contact  Purcell Mouton, RN Phone Number: 02/28/2022, 1:12 PM  Clinical Narrative:     Spoke with pt's Salli Quarry, concerning SNF and Hospice. [name] asked for hospice to follow pt at at [facility]. Referral given to Rico Junker, RN with [facility]. Will fax FL2 out to SNF.        Expected Discharge Plan and Services                                                 Social Determinants of Health (SDOH) Interventions    Readmission Risk Interventions    01/02/2022    9:59 AM 08/26/2020    1:33 PM  Readmission Risk Prevention Plan  Transportation Screening Complete Complete  PCP or Specialist Appt within 3-5 Days Complete Complete  HRI or Home Care Consult Complete Complete  Social Work Consult for [place] Planning/Counseling Complete Complete  Palliative Care Screening Not Applicable Not Applicable  Medication Review Press photographer) Complete Complete

## 2022-02-28 NOTE — NC FL2 (Signed)
[place] LEVEL OF CARE SCREENING TOOL     IDENTIFICATION  Patient Name: Jared White Birthdate: 1946/04/11 Sex: male Admission Date (Current Location): 02/24/2022  [facility] and [place] Number:  Herbalist and Address:  [facility],  [address], [place]      Provider Number: [id number]  Attending Physician Name and Address:  Hosie Poisson, MD  Relative Name and Phone Number:  Davis,[name] Relative [phone] Brother in law, Enis Gash Friend   [phone]    Current Level of Care: Hospital Recommended Level of Care: [place] Prior Approval Number:    Date Approved/Denied:   PASRR Number: [id number]  Discharge Plan: SNF    Current Diagnoses: Patient Active Problem List   Diagnosis Date Noted   Failure to thrive in adult 80/07/4915   Acute metabolic encephalopathy 91/50/5697   Weakness 02/24/2022   Irritant contact dermatitis associated with fecal stoma    Colostomy care [facility])    Rectal pain, chronic 01/31/2022   Rectal cancer metastasized to lung ([place]) 12/31/2021   Dizziness 12/20/2021   Lung nodule 11/30/2021   PAF (paroxysmal atrial fibrillation) ([place])    Protein-calorie malnutrition, severe 11/23/2021   Acute renal failure superimposed on stage 3b chronic kidney disease ([place]) 11/21/2021   Hydroureteronephrosis 11/21/2021   Lumbar back pain 11/21/2021   Elevated troponin 11/21/2021   Pre-op exam 08/11/2021   Ileostomy in place [facility]) 10/09/2020   AKI (acute kidney injury) ([place]) 08/24/2020   Preop cardiovascular exam 03/19/2020   Gastric polyps 02/07/2020   History of rectal cancer 02/07/2020   History of colonic polyps 02/07/2020   Barrett's esophagus without dysplasia 02/07/2020   Port-A-Cath in place 11/27/2019   Weight loss 11/27/2019   Rectal adenocarcinoma metastatic to lung ([place]) 08/19/2019   Abnormal MRI, pelvis 07/26/2019   Abnormal CT scan, pelvis 07/26/2019    Rectal bleeding 07/26/2019   Hemorrhoids 07/26/2019   Constipation 07/26/2019   Stage 3b chronic kidney disease ([place]) 09/13/2018   Coronary artery disease involving native coronary artery of native heart without angina pectoris 09/13/2018   Microalbuminuria 10/16/2015   Facial numbness    Hypomagnesemia    Hypocalcemia 10/08/2015   Psoriasis 11/21/2013   S/P CABG x 4 03/14/2013   Multiple thoracic/lumbar transverse process fractures 12/15/2012   Fall from roof 12/15/2012   CAD (coronary artery disease) 12/15/2012   GERD (gastroesophageal reflux disease) 12/15/2012   Essential hypertension 12/15/2012   Hyperlipidemia 12/15/2012   Multiple bilateral rib fractures     Orientation RESPIRATION BLADDER Height & Weight     Self  Normal Continent Weight: 52.2 kg Height:  '5\' 8"'$  (172.7 cm)  BEHAVIORAL SYMPTOMS/MOOD NEUROLOGICAL BOWEL NUTRITION STATUS      Colostomy Diet (Regular)  AMBULATORY STATUS COMMUNICATION OF NEEDS Skin   Extensive Assist Verbally Normal                       Personal Care Assistance Level of Assistance  Bathing, Feeding, Dressing Bathing Assistance: Limited assistance Feeding assistance: Limited assistance Dressing Assistance: Limited assistance     Functional Limitations Info  Sight, Hearing, Speech Sight Info: Impaired (Glasses) Hearing Info: Impaired (Impairment) Speech Info: Adequate    SPECIAL CARE FACTORS FREQUENCY  PT (By licensed PT), OT (By licensed OT)     PT Frequency: x5 week OT Frequency: x5 week            Contractures Contractures Info: Not present    Additional  Factors Info  Code Status, Allergies Code Status Info: DNR Allergies Info: No Known Allergies           Current Medications (02/28/2022):  This is the current hospital active medication list Current Facility-Administered Medications  Medication Dose Route Frequency Provider Last Rate Last Admin   acetaminophen (TYLENOL) tablet 650 mg  650 mg Oral Q4H PRN  Hosie Poisson, MD   650 mg at 02/25/22 1708   alum & mag hydroxide-simeth (MAALOX/MYLANTA) 200-200-20 MG/5ML suspension 30 mL  30 mL Oral Q4H PRN Tu, Ching T, DO   30 mL at 02/25/22 0300   apixaban (ELIQUIS) tablet 2.5 mg  2.5 mg Oral BID Tu, Ching T, DO   2.5 mg at 02/28/22 0810   atenolol (TENORMIN) tablet 50 mg  50 mg Oral Daily Tu, Ching T, DO   50 mg at 02/28/22 2952   B-complex with vitamin C tablet 1 tablet  1 tablet Oral Daily Hosie Poisson, MD   1 tablet at 02/26/22 2210   calcium carbonate (OS-CAL - dosed in mg of elemental calcium) tablet 1,250 mg  1 tablet Oral BID WC Tu, Ching T, DO   1,250 mg at 02/28/22 0810   Chlorhexidine Gluconate Cloth 2 % PADS 6 each  6 each Topical Daily Hosie Poisson, MD   6 each at 02/28/22 0811   cyanocobalamin (VITAMIN B12) tablet 1,000 mcg  1,000 mcg Oral Daily Tu, Ching T, DO   1,000 mcg at 02/28/22 0810   diltiazem (CARDIZEM CD) 24 hr capsule 120 mg  120 mg Oral Daily Tu, Ching T, DO   120 mg at 02/28/22 0810   feeding supplement (ENSURE ENLIVE / ENSURE PLUS) liquid 237 mL  237 mL Oral BID BM Hosie Poisson, MD   237 mL at 02/28/22 0811   fiber supplement (BANATROL TF) liquid 60 mL  60 mL Oral BID Hosie Poisson, MD   60 mL at 02/28/22 0812   fludrocortisone (FLORINEF) tablet 0.1 mg  0.1 mg Oral Daily Tu, Ching T, DO   0.1 mg at 02/28/22 8413   hydrOXYzine (ATARAX) tablet 25 mg  25 mg Oral TID PRN Hosie Poisson, MD   25 mg at 02/25/22 2129   LORazepam (ATIVAN) injection 1-2 mg  1-2 mg Intravenous Q6H PRN Hosie Poisson, MD   2 mg at 02/26/22 1555   magnesium oxide (MAG-OX) tablet 200 mg  200 mg Oral Daily Tu, Ching T, DO   200 mg at 02/28/22 0810   multivitamin with minerals tablet 1 tablet  1 tablet Oral Daily Hosie Poisson, MD   1 tablet at 02/28/22 0810   ondansetron (ZOFRAN) injection 4 mg  4 mg Intravenous Q6H PRN Hosie Poisson, MD   4 mg at 02/25/22 1100   Oral care mouth rinse  15 mL Mouth Rinse PRN Hosie Poisson, MD       oxyCODONE (Oxy  IR/ROXICODONE) immediate release tablet 5 mg  5 mg Oral Q4H PRN Tu, Ching T, DO   5 mg at 02/28/22 0016   pantoprazole (PROTONIX) EC tablet 40 mg  40 mg Oral Daily Tu, Ching T, DO   40 mg at 02/28/22 0810   QUEtiapine (SEROQUEL) tablet 25 mg  25 mg Oral QHS Hosie Poisson, MD       rosuvastatin (CRESTOR) tablet 20 mg  20 mg Oral Daily Tu, Ching T, DO   20 mg at 02/28/22 0810   sodium chloride flush (NS) 0.9 % injection 10-40 mL  10-40 mL Intracatheter Q12H  Hosie Poisson, MD   10 mL at 02/28/22 3358   sodium chloride flush (NS) 0.9 % injection 10-40 mL  10-40 mL Intracatheter PRN Hosie Poisson, MD   10 mL at 02/26/22 0333   traMADol (ULTRAM) tablet 50 mg  50 mg Oral Q6H PRN Hosie Poisson, MD         Discharge Medications: Please see discharge summary for a list of discharge medications.  Relevant Imaging Results:  Relevant Lab Results:   Additional Information [phone]  Purcell Mouton, RN

## 2022-03-01 LAB — CULTURE, BLOOD (ROUTINE X 2)
Culture: NO GROWTH
Special Requests: ADEQUATE

## 2022-03-01 NOTE — Care Management Important Message (Signed)
Important Message  Patient Details IM Letter placed in Patients room. Name: Jared White MRN: [id number] Date of Birth: [birth year]   Medicare Important Message Given:  Yes     Kerin Salen 03/01/2022, 11:04 AM

## 2022-03-01 NOTE — TOC Progression Note (Signed)
Transition of Care [facility]) - Progression Note    Patient Details  Name: Jared White MRN: [id number] Date of Birth: Aug 11, 1945  Transition of Care [facility]) CM/SW Contact  Davionne Dowty, Juliann Pulse, RN Phone Number: 03/01/2022, 11:57 AM  Clinical Narrative:   spoke to [name](HCPOA)explained what's needed for ST SNF w/otpt palliative care servives-agreeable. [facility] rep Melissa following. Noted mitts/sitter must be removed 24hrs prior faxing fl2 out, prior choice, & auth.MD updated.    Expected Discharge Plan: [place] Barriers to Discharge: Continued Medical Work up  Expected Discharge Plan and Services Expected Discharge Plan: [place] Choice: [place] arrangements for the past 2 months: Single Family Home                                       Social Determinants of Health (SDOH) Interventions    Readmission Risk Interventions    01/02/2022    9:59 AM 08/26/2020    1:33 PM  Readmission Risk Prevention Plan  Transportation Screening Complete Complete  PCP or Specialist Appt within 3-5 Days Complete Complete  HRI or Home Care Consult Complete Complete  Social Work Consult for [place] Planning/Counseling Complete Complete  Palliative Care Screening Not Applicable Not Applicable  Medication Review [employer]) Complete Complete

## 2022-03-01 NOTE — Progress Notes (Signed)
[facility]                                                                               Jalik Gellatly, is a 76 y.o. male, DOB - November 23, 1945, BZJ:[id number] Admit date - 02/24/2022    Outpatient Primary MD for the patient is Bonnita Hollow, MD  LOS - 3  days    Brief summary   Erman Thum Casady is a 76 y.o. male with medical history significant of CAD s/p CABG, CKD3A, HTN, HLD,A.fib on Eliquis, recurrent rectal adenocarcinoma with mets to lung s/p reversal of diverting ileostomy now with diverting loop colostomy presenting with decrease weakness and inability to care for himself.  He was admitted for failure to thrive , therapy evaluations and acute metabolic encephalopathy.  Patient failed to improve, has worsening confusion, continues to decline. Palliative care consulted, plan for SNF with hospice and ultimately home with hospice. Discussed with HCPOA, focus on comfort , no blood draws, no aggressive management, short term SNF with either hospice or palliative care services, no escalation of care.  Currently waiting to discontinue the sitter so he can be discharged to SNF.   Assessment & Plan    Assessment and Plan: Generalized weakness/ Failure to thrive:. Likely continual decline from underlying malignancy and dehydration. Replaced electrolytes without much improvement.  Palliative care consulted , appreciate recommendations.  Dr Alen Blew is aware and agrees with the plan.  Therapy eval recommending SNF.   Acute metabolic encephalopathy Appears disoriented at times  and delirious with sun downing in the setting of moderate dementia.  Unable to give IV haldol due to QTC, prn IV ativan ordered.  Sitter and restraints as patient had been agitated and was trying to get out of bed and he is very unsteady on his feet.  MRI brain is negative for stroke.   Colostomy care [facility]) Colostomy care per RN  PAF (paroxysmal atrial fibrillation) (HCC) Rate controlled -continue  Eliquis, diltiazem, atenolol. Would recommend stopping the Eliquis on discharge as he is very unsteady on his feet and we are focusing on him being comfortable.   Rectal adenocarcinoma metastatic to lung [facility]) S/p LAR, Diverting loop colostomy S/p  SBRT for pulmonary nodules with last session on 02/10/2022.  Patient has been referred to palliative care outpatient and may soon transition to hospice at home after discharge from SNF. Marland Kitchen Palliative care consulted.   Stage 3b chronic kidney disease (HCC) Creatinine improved to 1. With IV fluids.  IV fluids stopped.   Hypocalcemia Replaced.  Albumin level is 3.1    Hypokalemia , Hypomagnesemia and hypophosphatemia; Replaced. Hcpoa requested all labs to be discontinued.   S/P CABG x 4 No chest pain or sob.   Hyperlipidemia Continue statin   Lactic acidosis:  Suspect from dehydration.  Normalized.    Mild anemia and thrombocytopenia:  Monitor.    Elevated troponin from demand ischemia from dehydration.  Denies chest pain.  Prolonged QT: Avoid QT prolonging drugs.    Diarrhea / Loose bowel movements:  C diff pcr is negative. .  Interventions: Ensure Enlive (each supplement provides 350kcal and 20 grams of protein), MVI  Estimated body mass index is 17.49  kg/m as calculated from the following:   Height as of this encounter: '5\' 8"'$  (1.727 m).   Weight as of this encounter: 52.2 kg.  Code Status: full code.  DVT Prophylaxis:  apixaban (ELIQUIS) tablet 2.5 mg Start: 02/25/22 0015 apixaban (ELIQUIS) tablet 2.5 mg   Level of Care: Level of care: Med-Surg Family Communication:  none at bedside.   Disposition Plan:     Remains inpatient appropriate:  awaiting SNF with hospice.   Procedures:  MRI brain without contrast.  Consultants:   Palliative care.   Antimicrobials:   Anti-infectives (From admission, onward)    None        Medications  Scheduled Meds:  apixaban  2.5 mg Oral BID   atenolol  50 mg Oral  Daily   B-complex with vitamin C  1 tablet Oral Daily   calcium carbonate  1 tablet Oral BID WC   Chlorhexidine Gluconate Cloth  6 each Topical Daily   cyanocobalamin  1,000 mcg Oral Daily   diltiazem  120 mg Oral Daily   feeding supplement  237 mL Oral BID BM   fiber supplement (BANATROL TF)  60 mL Oral BID   fludrocortisone  0.1 mg Oral Daily   magnesium oxide  200 mg Oral Daily   multivitamin with minerals  1 tablet Oral Daily   pantoprazole  40 mg Oral Daily   QUEtiapine  25 mg Oral QHS   rosuvastatin  20 mg Oral Daily   sodium chloride flush  10-40 mL Intracatheter Q12H   Continuous Infusions:   PRN Meds:.acetaminophen, alum & mag hydroxide-simeth, hydrOXYzine, LORazepam, ondansetron (ZOFRAN) IV, mouth rinse, oxyCODONE, sodium chloride flush, traMADol    Subjective:   [name] was seen and examined today.  Sleeping comfortably. As per RN, patient was very agitated last night, had to call security at bedside, . All day so far he has been calm .   Objective:   Vitals:   02/28/22 0810 02/28/22 1317 03/01/22 0614 03/01/22 1340  BP:  (!) 142/72 137/84 (!) 142/72  Pulse: 65 62 (!) 54 66  Resp:  '18 18 18  '$ Temp:  (!) 97.5 F (36.4 C) 98.1 F (36.7 C) (!) 97.5 F (36.4 C)  TempSrc:  Oral Oral Oral  SpO2:  100% 99% 99%  Weight:      Height:        Intake/Output Summary (Last 24 hours) at 03/01/2022 1625 Last data filed at 03/01/2022 1300 Gross per 24 hour  Intake 480 ml  Output --  Net 480 ml    Filed Weights   02/24/22 1811  Weight: 52.2 kg     Exam  General exam: ill appearing elderly gentleman, not in distress.  Respiratory system: Clear to auscultation. Respiratory effort normal. Cardiovascular system: S1 & S2 heard, RRR. No pedal edema. Gastrointestinal system: Abdomen is soft, colostomy in place with liquid stool.  Central nervous system: sleeping comfortably. Opens eyes on calling his name, following simple commands.  Extremities: no edema.  Skin:  No rashes seen.  Psychiatry: calm.        Data Reviewed:  I have personally reviewed following labs and imaging studies   CBC Lab Results  Component Value Date   WBC 8.5 02/26/2022   RBC 3.17 (L) 02/26/2022   HGB 10.2 (L) 02/26/2022   HCT 29.9 (L) 02/26/2022   MCV 94.3 02/26/2022   MCH 32.2 02/26/2022   PLT 123 (L) 02/26/2022   MCHC 34.1 02/26/2022   RDW 13.5 02/26/2022  LYMPHSABS 1.4 02/26/2022   MONOABS 0.9 02/26/2022   EOSABS 0.1 02/26/2022   BASOSABS 0.0 85/92/9244     Last metabolic panel Lab Results  Component Value Date   NA 137 02/28/2022   K 4.2 02/28/2022   CL 109 02/28/2022   CO2 25 02/28/2022   BUN 21 02/28/2022   CREATININE 1.05 02/28/2022   GLUCOSE 109 (H) 02/28/2022   GFRNONAA >60 02/28/2022   GFRAA >60 01/28/2020   CALCIUM 8.3 (L) 02/28/2022   PHOS 2.5 02/27/2022   PROT 5.9 (L) 02/26/2022   ALBUMIN 3.1 (L) 02/26/2022   BILITOT 1.0 02/26/2022   ALKPHOS 62 02/26/2022   AST 39 02/26/2022   ALT 15 02/26/2022   ANIONGAP 3 (L) 02/28/2022    CBG (last 3)  No results for input(s): "GLUCAP" in the last 72 hours.    Coagulation Profile: Recent Labs  Lab 02/24/22 1827  INR 1.1      Radiology Studies: No results found.     Hosie Poisson M.D. [facility] 03/01/2022, 4:25 PM  Available via Epic secure chat 7am-7pm After 7 pm, please refer to night coverage provider listed on amion.

## 2022-03-01 NOTE — Progress Notes (Signed)
PT Cancellation Note  Patient Details Name: Jared White MRN: [id number] DOB: 11/17/1945   Cancelled Treatment:    Reason Eval/Treat Not Completed: Patient declined, no reason specified Pt declined stating he had just ambulated in hallway.  Nurse tech confirms this and states he was not steady.  Will check back as schedule permits.   Myrtis Hopping [place] 03/01/2022, 2:40 PM Jannette Spanner PT, DPT Physical Therapist Acute Rehabilitation Services Preferred contact method: Secure Chat Weekend Pager Only: [phone] Office: [phone]

## 2022-03-01 NOTE — Progress Notes (Signed)
[facility] [address] Manufacturing engineer [facility]) Hospital Liaison note:  Notified by Nuala Alpha of request for [facility] Palliative Care services. Will continue to follow for disposition.  Please call with any outpatient palliative questions or concerns.  Thank you for the opportunity to participate in this patient's care.  Thank you, Lorelee Market, LPN [facility] Liaison [phone]

## 2022-03-02 ENCOUNTER — Inpatient Hospital Stay: Payer: Medicare Other | Admitting: Oncology

## 2022-03-02 DIAGNOSIS — R627 Adult failure to thrive: Secondary | ICD-10-CM | POA: Diagnosis not present

## 2022-03-02 DIAGNOSIS — C20 Malignant neoplasm of rectum: Secondary | ICD-10-CM | POA: Diagnosis not present

## 2022-03-02 DIAGNOSIS — Z7189 Other specified counseling: Secondary | ICD-10-CM | POA: Diagnosis not present

## 2022-03-02 DIAGNOSIS — R531 Weakness: Secondary | ICD-10-CM | POA: Diagnosis not present

## 2022-03-02 LAB — CULTURE, BLOOD (ROUTINE X 2): Culture: NO GROWTH

## 2022-03-02 MED ORDER — HALOPERIDOL LACTATE 5 MG/ML IJ SOLN: 2.0000 mg | Freq: Four times a day (QID) | INTRAMUSCULAR | Status: DC | PRN

## 2022-03-02 NOTE — Progress Notes (Signed)
Progress Note    Jared White   JOA:[id number]  DOB: [birth year]  DOA: 02/24/2022     4 PCP: Bonnita Hollow, MD  Initial CC: weakness  Hospital Course: Jared White is a 76 y.o. male with PMH CAD s/p CABG, CKD3A, HTN, HLD,A.fib on Eliquis, recurrent rectal adenocarcinoma with mets to lung s/p reversal of diverting ileostomy now with diverting loop colostomy who presented with weakness and inability to care for himself.  He was admitted for failure to thrive, therapy evaluations and acute metabolic encephalopathy. Patient failed to improve, had worsening confusion, and overall progressive functional decline. He was evaluated by palliative care with recommendations for SNF with hospice.  Discussions were also held with his POA who also was amenable with pursuing transitioning to comfort mentality.   Interval History:  No events overnight.  Sitter and mittens have been discontinued this morning. Remains cooperative.   Assessment and Plan:  Generalized weakness Failure to thrive Likely continual decline from underlying malignancy and dehydration Palliative care consulted , appreciate recommendations; SNF with hospice then eventually home with hospice in care of [place] - Dr Alen Blew is aware and agrees with the plan   Acute metabolic encephalopathy - Appears disoriented at times and delirious with sun downing in the setting of moderate dementia - MRI brain is negative for stroke.  - continue seroquel - d/c sitter morning of 10/11 and avoid mittens use as able - EKG repeated on 10/11; minimally prolonged QTc 465 - okay for PRN haldol if necessary  Colostomy care [facility]) Colostomy care per RN   PAF (paroxysmal atrial fibrillation) ([place]) - de-escalating meds as able - d/c eliquis (high fall risk) and atenolol given pursuit of comfort at this time - continue cardizem; adjustment as necessary    Rectal adenocarcinoma metastatic to lung ([place]) S/p LAR, Diverting loop  colostomy - S/p SBRT for pulmonary nodules with last session on 02/10/2022.   -Continue hospice upon discharge to SNF and eventually if goes home   Stage 3b chronic kidney disease ([place]) -S/p IV fluids   Hypocalcemia Replaced.    Hypokalemia , Hypomagnesemia and hypophosphatemia Replaced   S/P CABG x 4 No chest pain or sob.    Hyperlipidemia - d/c statin as no further mortality benefit     Lactic acidosis Suspect from dehydration.  Normalized.    Mild anemia and thrombocytopenia - continue comfort care   Elevated troponin from demand ischemia from dehydration.  Denies chest pain    Diarrhea / Loose bowel movements:  C diff pcr is negative Interventions: Ensure Enlive (each supplement provides 350kcal and 20 grams of protein), MVI   Estimated body mass index is 17.49 kg/m as calculated from the following:   Height as of this encounter: '5\' 8"'$  (1.727 m).   Weight as of this encounter: 52.2 kg.    Old records reviewed in assessment of this patient  Antimicrobials:   DVT prophylaxis:     Code Status:   Code Status: DNR  Mobility Assessment (last 72 hours)     Mobility Assessment     Row Name 03/02/22 1526 03/01/22 2005 03/01/22 1100 02/28/22 2038 02/28/22 0800   Does patient have an order for bedrest or is patient medically unstable -- No - Continue assessment No - Continue assessment No - Continue assessment No - Continue assessment   What is the highest level of mobility based on the progressive mobility assessment? Level 5 (Walks with assist in room/hall) - Balance while stepping forward/back  and can walk in room with assist - Complete Level 5 (Walks with assist in room/hall) - Balance while stepping forward/back and can walk in room with assist - Complete Level 5 (Walks with assist in room/hall) - Balance while stepping forward/back and can walk in room with assist - Complete Level 5 (Walks with assist in room/hall) - Balance while stepping forward/back and can  walk in room with assist - Complete Level 5 (Walks with assist in room/hall) - Balance while stepping forward/back and can walk in room with assist - Complete   Is the above level different from baseline mobility prior to current illness? -- -- Yes - Recommend PT order -- Yes - Recommend PT order    Row Name 02/27/22 2100           Does patient have an order for bedrest or is patient medically unstable No - Continue assessment       What is the highest level of mobility based on the progressive mobility assessment? Level 5 (Walks with assist in room/hall) - Balance while stepping forward/back and can walk in room with assist - Complete       Is the above level different from baseline mobility prior to current illness? Yes - Recommend PT order                Barriers to discharge:  Disposition Plan:  SNF Status is: Inpt  Objective: Blood pressure 125/60, pulse (!) 53, temperature 98.1 F (36.7 C), temperature source Oral, resp. rate 19, height '5\' 8"'$  (1.727 m), weight 52.2 kg, SpO2 99 %.  Examination:  Physical Exam Constitutional:      General: He is not in acute distress. HENT:     Head: Normocephalic and atraumatic.     Mouth/Throat:     Mouth: Mucous membranes are moist.  Eyes:     Extraocular Movements: Extraocular movements intact.  Cardiovascular:     Rate and Rhythm: Normal rate and regular rhythm.  Pulmonary:     Effort: Pulmonary effort is normal.     Breath sounds: Normal breath sounds.  Abdominal:     General: Bowel sounds are normal.     Palpations: Abdomen is soft.  Musculoskeletal:        General: Normal range of motion.     Cervical back: Normal range of motion.  Skin:    General: Skin is warm and dry.  Neurological:     Mental Status: He is alert.     Comments: follows commands, moves all 4 extremities      Consultants:  Palliative care  Procedures:    Data Reviewed: No results found for this or any previous visit (from the past 24 hour(s)).   I have Reviewed nursing notes, Vitals, and Lab results since pt's last encounter. Pertinent lab results : see above I have ordered test including BMP, CBC, Mg I have reviewed the last note from staff over past 24 hours I have discussed pt's care plan and test results with nursing staff, case manager   LOS: 4 days   Dwyane Dee, MD [facility] 03/02/2022, 5:21 PM

## 2022-03-02 NOTE — Progress Notes (Signed)
1:1 sitter Discontinued this am per order. Mitts have not been medically necessary today either. So far patient has been very pleasant & cooperative.  A&Ox3 (knows year 2023 but unaware of current month or timeline of events)

## 2022-03-02 NOTE — Progress Notes (Signed)
Physical Therapy Treatment Patient Details Name: Jared White MRN: [id number] DOB: 1946/01/16 Today's Date: 03/02/2022   History of Present Illness Pt is a 76 y.o. male admitted 02/24/22 for generalized weakness, likely continual decline from underlying malignancy and dehydration. PMHx significant for but not limited to:  recurrent rectal adenocarcinoma with mets to lung s/p reversal of diverting ileostomy now with diverting loop colostomy, chronic diarrhea, hypocalcemia, CAD s/p CABG, CKD, HTN, HLD, tobacco use.    PT Comments    Progressing with mobility. Pt participated well. Tolerated ambulation distance well. Assisted pt back to bed at his request.    Recommendations for follow up therapy are one component of a multi-disciplinary discharge planning process, led by the attending physician.  Recommendations may be updated based on patient status, additional functional criteria and insurance authorization.  Follow Up Recommendations  Skilled nursing-short term rehab (<3 hours/day) Can patient physically be transported by private vehicle: Yes   Assistance Recommended at Discharge Frequent or constant Supervision/Assistance  Patient can return home with the following A little help with walking and/or transfers;A little help with bathing/dressing/bathroom;Assistance with cooking/housework;Direct supervision/assist for medications management;Help with stairs or ramp for entrance;Assist for transportation   Equipment Recommendations  None recommended by PT    Recommendations for Other Services       Precautions / Restrictions Precautions Precautions: Fall Precaution Comments: colostomy Restrictions Weight Bearing Restrictions: No     Mobility  Bed Mobility Overal bed mobility: Modified Independent Bed Mobility: Supine to Sit, Sit to Supine     Supine to sit: HOB elevated, Modified independent (Device/Increase time) Sit to supine: HOB elevated, Modified independent  (Device/Increase time)   General bed mobility comments: Mod Ind    Transfers Overall transfer level: Needs assistance Equipment used: Rolling walker (2 wheels) Transfers: Sit to/from Stand Sit to Stand: Min guard, From elevated surface           General transfer comment: Cues for safety, hand placement.    Ambulation/Gait Ambulation/Gait assistance: Min guard Gait Distance (Feet): 75 Feet Assistive device: Rolling walker (2 wheels) Gait Pattern/deviations: Step-through pattern, Decreased stride length       General Gait Details: Min guard A for safety. Pt reported feeling a bit off balance-felt walker did not roll in a straight line. He tolerated distance well.   Stairs             Wheelchair Mobility    Modified Rankin (Stroke Patients Only)       Balance Overall balance assessment: Needs assistance         Standing balance support: Bilateral upper extremity supported, Reliant on assistive device for balance, During functional activity Standing balance-Leahy Scale: Poor                              Cognition Arousal/Alertness: Awake/alert Behavior During Therapy: WFL for tasks assessed/performed Overall Cognitive Status: Within Functional Limits for tasks assessed                                          Exercises      General Comments        Pertinent Vitals/Pain Pain Assessment Pain Assessment: No/denies pain    Home Living  Prior Function            PT Goals (current goals can now be found in the care plan section) Progress towards PT goals: Progressing toward goals    Frequency    Min 2X/week      PT Plan Current plan remains appropriate    Co-evaluation              AM-PAC PT "6 Clicks" Mobility   Outcome Measure  Help needed turning from your back to your side while in a flat bed without using bedrails?: None Help needed moving from lying on  your back to sitting on the side of a flat bed without using bedrails?: None Help needed moving to and from a bed to a chair (including a wheelchair)?: A Little Help needed standing up from a chair using your arms (e.g., wheelchair or bedside chair)?: A Little Help needed to walk in hospital room?: A Little Help needed climbing 3-5 steps with a railing? : A Little 6 Click Score: 20    End of Session Equipment Utilized During Treatment: Gait belt Activity Tolerance: Patient tolerated treatment well Patient left: in bed;with call bell/phone within reach;with bed alarm set   PT Visit Diagnosis: Difficulty in walking, not elsewhere classified (R26.2);Unsteadiness on feet (R26.81)     Time: 6825-7493 PT Time Calculation (min) (ACUTE ONLY): 14 min  Charges:  $Gait Training: 8-22 mins                        Doreatha Massed, PT Acute Rehabilitation  Office: [phone] Pager: [phone]

## 2022-03-02 NOTE — Progress Notes (Signed)
OT Cancellation Note  Patient Details Name: Griffon Herberg Bamburg MRN: [id number] DOB: [birth year]   Cancelled Treatment:     Nurse asked for therapy to hold for now and check back later, as the pt was very sleepy/lethargic.   Leota Sauers, OTR/L 03/02/2022, 3:49 PM

## 2022-03-02 NOTE — NC FL2 (Signed)
[place] LEVEL OF CARE SCREENING TOOL     IDENTIFICATION  Patient Name: Jared White Birthdate: 01-Jan-1946 Sex: male Admission Date (Current Location): 02/24/2022  [facility] [place] and [place] Number:  Herbalist and Address:  [facility],  [place], [place]      Provider Number: [id number]  Attending Physician Name and Address:  Dwyane Dee, MD  Relative Name and Phone Number:  Davis,[name] Relative [phone] Brother in law, Enis Gash Friend   [phone]    Current Level of Care: Hospital Recommended Level of Care: [place] Prior Approval Number:    Date Approved/Denied:   PASRR Number: [id number]  Discharge Plan: SNF    Current Diagnoses: Patient Active Problem List   Diagnosis Date Noted   Failure to thrive in adult 20/23/3435   Acute metabolic encephalopathy 68/61/6837   Weakness 02/24/2022   Irritant contact dermatitis associated with fecal stoma    Colostomy care [facility])    Rectal pain, chronic 01/31/2022   Rectal cancer metastasized to lung ([place]) 12/31/2021   Dizziness 12/20/2021   Lung nodule 11/30/2021   PAF (paroxysmal atrial fibrillation) ([place])    Protein-calorie malnutrition, severe 11/23/2021   Acute renal failure superimposed on stage 3b chronic kidney disease ([place]) 11/21/2021   Hydroureteronephrosis 11/21/2021   Lumbar back pain 11/21/2021   Elevated troponin 11/21/2021   Pre-op exam 08/11/2021   Ileostomy in place [facility]) 10/09/2020   AKI (acute kidney injury) ([place]) 08/24/2020   Preop cardiovascular exam 03/19/2020   Gastric polyps 02/07/2020   History of rectal cancer 02/07/2020   History of colonic polyps 02/07/2020   Barrett's esophagus without dysplasia 02/07/2020   Port-A-Cath in place 11/27/2019   Weight loss 11/27/2019   Rectal adenocarcinoma metastatic to lung ([place]) 08/19/2019   Abnormal MRI, pelvis 07/26/2019   Abnormal CT scan, pelvis 07/26/2019    Rectal bleeding 07/26/2019   Hemorrhoids 07/26/2019   Constipation 07/26/2019   Stage 3b chronic kidney disease ([place]) 09/13/2018   Coronary artery disease involving native coronary artery of native heart without angina pectoris 09/13/2018   Microalbuminuria 10/16/2015   Facial numbness    Hypomagnesemia    Hypocalcemia 10/08/2015   Psoriasis 11/21/2013   S/P CABG x 4 03/14/2013   Multiple thoracic/lumbar transverse process fractures 12/15/2012   Fall from roof 12/15/2012   CAD (coronary artery disease) 12/15/2012   GERD (gastroesophageal reflux disease) 12/15/2012   Essential hypertension 12/15/2012   Hyperlipidemia 12/15/2012   Multiple bilateral rib fractures     Orientation RESPIRATION BLADDER Height & Weight     Self  Normal Continent Weight: 52.2 kg Height:  '5\' 8"'$  (172.7 cm)  BEHAVIORAL SYMPTOMS/MOOD NEUROLOGICAL BOWEL NUTRITION STATUS      Colostomy Diet (Regular)  AMBULATORY STATUS COMMUNICATION OF NEEDS Skin   Extensive Assist Verbally Normal                       Personal Care Assistance Level of Assistance  Bathing, Feeding, Dressing Bathing Assistance: Limited assistance Feeding assistance: Limited assistance Dressing Assistance: Limited assistance     Functional Limitations Info  Sight, Hearing, Speech Sight Info: Impaired (Glasses) Hearing Info: Impaired (Impairment) Speech Info: Adequate    SPECIAL CARE FACTORS FREQUENCY  PT (By licensed PT), OT (By licensed OT)     PT Frequency: x5 week OT Frequency: x5 week            Contractures Contractures Info: Not present    Additional  Factors Info  Code Status, Allergies Code Status Info: DNR Allergies Info: No Known Allergies           Current Medications (03/02/2022):  This is the current hospital active medication list Current Facility-Administered Medications  Medication Dose Route Frequency Provider Last Rate Last Admin   acetaminophen (TYLENOL) tablet 650 mg  650 mg Oral Q4H PRN  Hosie Poisson, MD   650 mg at 02/25/22 1708   alum & mag hydroxide-simeth (MAALOX/MYLANTA) 200-200-20 MG/5ML suspension 30 mL  30 mL Oral Q4H PRN Tu, Ching T, DO   30 mL at 03/01/22 1721   Chlorhexidine Gluconate Cloth 2 % PADS 6 each  6 each Topical Daily Hosie Poisson, MD   6 each at 03/01/22 1955   diltiazem (CARDIZEM CD) 24 hr capsule 120 mg  120 mg Oral Daily Tu, Ching T, DO   120 mg at 03/01/22 1209   feeding supplement (ENSURE ENLIVE / ENSURE PLUS) liquid 237 mL  237 mL Oral BID BM Hosie Poisson, MD   237 mL at 03/01/22 1722   fiber supplement (BANATROL TF) liquid 60 mL  60 mL Oral BID Hosie Poisson, MD   60 mL at 03/01/22 1949   hydrOXYzine (ATARAX) tablet 25 mg  25 mg Oral TID PRN Hosie Poisson, MD   25 mg at 02/25/22 2129   ondansetron (ZOFRAN) injection 4 mg  4 mg Intravenous Q6H PRN Hosie Poisson, MD   4 mg at 02/25/22 1100   Oral care mouth rinse  15 mL Mouth Rinse PRN Hosie Poisson, MD       oxyCODONE (Oxy IR/ROXICODONE) immediate release tablet 5 mg  5 mg Oral Q4H PRN Tu, Ching T, DO   5 mg at 02/28/22 0016   pantoprazole (PROTONIX) EC tablet 40 mg  40 mg Oral Daily Tu, Ching T, DO   40 mg at 03/01/22 1210   QUEtiapine (SEROQUEL) tablet 25 mg  25 mg Oral QHS Hosie Poisson, MD   25 mg at 03/01/22 2052   sodium chloride flush (NS) 0.9 % injection 10-40 mL  10-40 mL Intracatheter Q12H Hosie Poisson, MD   10 mL at 03/01/22 2100   sodium chloride flush (NS) 0.9 % injection 10-40 mL  10-40 mL Intracatheter PRN Hosie Poisson, MD   10 mL at 02/26/22 2025     Discharge Medications: Please see discharge summary for a list of discharge medications.  Relevant Imaging Results:  Relevant Lab Results:   Additional Information [phone]  Dessa Phi, RN

## 2022-03-02 NOTE — Hospital Course (Signed)
Jared White is a 76 y.o. male with PMH CAD s/p CABG, CKD3A, HTN, HLD,A.fib on Eliquis, recurrent rectal adenocarcinoma with mets to lung s/p reversal of diverting ileostomy now with diverting loop colostomy who presented with weakness and inability to care for himself.  He was admitted for failure to thrive, therapy evaluations and acute metabolic encephalopathy. Patient failed to improve, had worsening confusion, and overall progressive functional decline. He was evaluated by palliative care with recommendations for SNF with hospice.  Discussions were also held with his POA who also was amenable with pursuing transitioning to comfort mentality.

## 2022-03-02 NOTE — Progress Notes (Addendum)
Daily Progress Note   Patient Name: Jared White       Date: 03/02/2022 DOB: [birth year]  Age: 76 y.o. MRN#: [id number] Attending Physician: Dwyane Dee, MD Primary Care Physician: Bonnita Hollow, MD Admit Date: 02/24/2022  Reason for Consultation/Follow-up: Establishing goals of care  Patient Profile/HPI: 76 y.o. male  with past medical history of rectal cancer diagnosed in March 2021- completed neoadjuvant chemotherapy and radiation, underwent resection of mass January 2022 with diverting ileostomy - pathology showed negative margins, positive lymph nodes, was recommended for surveillance unless he relapsed. He underwent ileostomy closure May 2022. In January of 2023 there was recurrence at the site of the coloanal anastomosis and mass was biopsied indicating high grade dysplasia in February 2023- resection and permanent colostomy were recommended and completed in August 2023 (delayed due to patient not completing bowel prep and due to hypocalcemia) in the interim he was found to have a lung nodule positive for metastatic colon in July 2023 and this has been treated with SBRT. He also had a small bowel excised during colostomy surgery in August but pathology resulted nonmalignant. Medical oncology did not recommend systemic chemotherapy unless he has further recurrence. He was admitted on 02/24/2022 with weakness, dehydration, confusion, elevated lactic acid, significant electrolyte derangements. C. Diff testing sent for diarrhea and was found to be negative. Palliative Medicine Team following for goals of care.    Subjective: Presented to bedside for visit. Patient observed sitting up in bed and smiling. When asked how he is doing, he reports feeling "tired." He denies pain but acknowledges  "irritation" to his back. Physical therapy at bedside and preparing to begin session.   Call placed to brother-in-law and HCPOA Jill Poling to offer support. He reports his understanding that the patient's prognosis is 1-3 months. [name] talks with patient frequently on the phone and states, "sometimes he is with it and sometimes he is not." [name] expresses sadness regarding patient's fluctuating mental status, especially patient's ability to remember past events while not recalling events from the hour before. [name] reports that yesterday patient thought he was going to "help build a house." Emotional support provided. [name]'s stated goal is to, "take [name] home and care for him there if able."   Kathreen Cosier has booked a flight into town on Monday night and plans to present  to hospital for visit on Tuesday 03/08/2022.  Review of Systems  Constitutional:  Positive for malaise/fatigue.  Musculoskeletal:        "Irritation" to back.   Physical Exam Constitutional:      Appearance: He is ill-appearing.  Pulmonary:     Effort: Pulmonary effort is normal.  Neurological:     Mental Status: He is alert.    Vital Signs: BP 125/60 (BP Location: Right Arm)   Pulse (!) 53   Temp 98.1 F (36.7 C) (Oral)   Resp 19   Ht '5\' 8"'$  (1.727 m)   Wt 52.2 kg   SpO2 99%   BMI 17.49 kg/m  SpO2: SpO2: 99 % O2 Device: O2 Device: Room Air O2 Flow Rate:    Intake/output summary:  Intake/Output Summary (Last 24 hours) at 03/02/2022 1435 Last data filed at 03/01/2022 2100 Gross per 24 hour  Intake 250 ml  Output --  Net 250 ml   LBM: Last BM Date : 03/01/22 Baseline Weight: Weight: 52.2 kg Most recent weight: Weight: 52.2 kg       Palliative Assessment/Data: 50%      Patient Active Problem List   Diagnosis Date Noted   Failure to thrive in adult 70/62/3762   Acute metabolic encephalopathy 83/15/1761   Weakness 02/24/2022   Irritant contact dermatitis associated with fecal stoma    Colostomy  care ([place])    Rectal pain, chronic 01/31/2022   Rectal cancer metastasized to lung ([place]) 12/31/2021   Dizziness 12/20/2021   Lung nodule 11/30/2021   PAF (paroxysmal atrial fibrillation) ([facility])    Protein-calorie malnutrition, severe 11/23/2021   Acute renal failure superimposed on stage 3b chronic kidney disease ([place]) 11/21/2021   Hydroureteronephrosis 11/21/2021   Lumbar back pain 11/21/2021   Elevated troponin 11/21/2021   Pre-op exam 08/11/2021   Ileostomy in place [facility]) 10/09/2020   AKI (acute kidney injury) ([place]) 08/24/2020   Preop cardiovascular exam 03/19/2020   Gastric polyps 02/07/2020   History of rectal cancer 02/07/2020   History of colonic polyps 02/07/2020   Barrett's esophagus without dysplasia 02/07/2020   Port-A-Cath in place 11/27/2019   Weight loss 11/27/2019   Rectal adenocarcinoma metastatic to lung ([place]) 08/19/2019   Abnormal MRI, pelvis 07/26/2019   Abnormal CT scan, pelvis 07/26/2019   Rectal bleeding 07/26/2019   Hemorrhoids 07/26/2019   Constipation 07/26/2019   Stage 3b chronic kidney disease ([place]) 09/13/2018   Coronary artery disease involving native coronary artery of native heart without angina pectoris 09/13/2018   Microalbuminuria 10/16/2015   Facial numbness    Hypomagnesemia    Hypocalcemia 10/08/2015   Psoriasis 11/21/2013   S/P CABG x 4 03/14/2013   Multiple thoracic/lumbar transverse process fractures 12/15/2012   Fall from roof 12/15/2012   CAD (coronary artery disease) 12/15/2012   GERD (gastroesophageal reflux disease) 12/15/2012   Essential hypertension 12/15/2012   Hyperlipidemia 12/15/2012   Multiple bilateral rib fractures     Palliative Care Assessment & Plan    Assessment/Recommendations/Plan  Continue with current hospital treatments. Plan for d/c to SNF for rehab with Palliative and eventual transition to home with hospice when Audry Pili is able to care for [name]   Code Status: DNR  Prognosis:  < 6  months  Discharge Planning: [place] for rehab with Palliative care service follow-up HCPOA [name]'s long-term goal is to take patient home and provide care for him there  Care plan was discussed with brother-in-law and HCPOA Jill Poling.  Thank you for allowing  the Palliative Medicine Team to assist in the care of this patient.  Greater than 50%  of this time was spent counseling and coordinating care related to the above assessment and plan.  Moss Mc, RN MSN [facility] / NP Student  Mariana Kaufman, AGNP-C Palliative Medicine   Please contact Palliative Medicine Team phone at [phone] for questions and concerns.

## 2022-03-02 NOTE — TOC Progression Note (Addendum)
Transition of Care [facility]) - Progression Note    Patient Details  Name: DESHUN SEDIVY MRN: [id number] Date of Birth: [birth year]  Transition of [facility]) CM/SW Contact  Theotis Gerdeman, Juliann Pulse, RN Phone Number: 03/02/2022, 9:44 AM  Clinical Narrative: Per prior TOC note-await mitts, & sitter removed 24hrs prior for start of fl2,bed choice, & auth.MD updated.  -11:43a faxed out await bed offers.     Expected Discharge Plan: [place] Barriers to Discharge: Continued Medical Work up  Expected Discharge Plan and Services Expected Discharge Plan: [place] Choice: [place] arrangements for the past 2 months: Single Family Home                                       Social Determinants of Health (SDOH) Interventions    Readmission Risk Interventions    01/02/2022    9:59 AM 08/26/2020    1:33 PM  Readmission Risk Prevention Plan  Transportation Screening Complete Complete  PCP or Specialist Appt within 3-5 Days Complete Complete  HRI or Home Care Consult Complete Complete  Social Work Consult for [place] Planning/Counseling Complete Complete  Palliative Care Screening Not Applicable Not Applicable  Medication Review Press photographer) Complete Complete

## 2022-03-03 NOTE — TOC Progression Note (Addendum)
Transition of Care [facility]) - Progression Note    Patient Details  Name: Jared White MRN: [id number] Date of Birth: [birth year]  Transition of Care [facility]) CM/SW Contact  Kanye Depree, Juliann Pulse, RN Phone Number: 03/03/2022, 10:14 AM  Clinical Narrative: Bed choices given to [facility] rep [place] following. Initiating auth.  -10:42a- initiated [place] for Richrd Humbles [id number] auth.    Expected Discharge Plan: Skilled Nursing Facility Barriers to Discharge: Insurance Authorization  Expected Discharge Plan and Services Expected Discharge Plan: [place] Choice: [place] arrangements for the past 2 months: Single Family Home                                       Social Determinants of Health (SDOH) Interventions    Readmission Risk Interventions    01/02/2022    9:59 AM 08/26/2020    1:33 PM  Readmission Risk Prevention Plan  Transportation Screening Complete Complete  PCP or Specialist Appt within 3-5 Days Complete Complete  HRI or Home Care Consult Complete Complete  Social Work Consult for [place] Planning/Counseling Complete Complete  Palliative Care Screening Not Applicable Not Applicable  Medication Review Press photographer) Complete Complete

## 2022-03-03 NOTE — Progress Notes (Signed)
Nutrition Follow-up  DOCUMENTATION CODES:   Severe malnutrition in context of chronic illness, Underweight  INTERVENTION:   -Ensure Plus High Protein po BID, each supplement provides 350 kcal and 20 grams of protein.   -Banatrol TF fiber supplement BID, each provides 45 kcals, 2g protein and 5g soluble fiber to aid diarrhea  NUTRITION DIAGNOSIS:   Severe Malnutrition related to chronic illness (recurrent rectal cancer) as evidenced by severe fat depletion, severe muscle depletion, percent weight loss.  Ongoing.  GOAL:   Patient will meet greater than or equal to 90% of their needs  Progressing.  MONITOR:   PO intake, Supplement acceptance, Labs, Weight trends, I & O's  REASON FOR ASSESSMENT:   Consult Assessment of nutrition requirement/status  ASSESSMENT:   76 y.o. male with medical history significant of CAD s/p CABG, CKD3A, HTN, HLD,A.fib on Eliquis, recurrent rectal adenocarcinoma with mets to lung s/p reversal of diverting ileostomy now with diverting loop colostomy presenting with decrease weakness and inability to care for himself.  Patient currently consuming 75-80% of meals. Has been disoriented. Has been drinking Ensure supplements, most doses.  Has been receiving Banatrol as well BID.  Noted that palliative care following patient. Pt expected to discharge to SNF with palliative following.   Admission weight: 115 lbs. No other weights this admission.   Medications reviewed.  Labs reviewed.  Diet Order:   Diet Order             Diet regular Room service appropriate? No; Fluid consistency: Thin  Diet effective now                   EDUCATION NEEDS:   No education needs have been identified at this time  Skin:  Skin Assessment: Reviewed RN Assessment  Last BM:  10/12 -type 7  Height:   Ht Readings from Last 1 Encounters:  02/24/22 '5\' 8"'$  (1.727 m)    Weight:   Wt Readings from Last 1 Encounters:  02/24/22 52.2 kg    BMI:  Body  mass index is 17.49 kg/m.  Estimated Nutritional Needs:   Kcal:  2100-2300  Protein:  100-115g  Fluid:  2.1L/day  Clayton Bibles, MS, RD, LDN Inpatient Clinical Dietitian Contact information available via Amion

## 2022-03-03 NOTE — Progress Notes (Signed)
Progress Note    Jared White   OZH:[id number]  DOB: 05-28-1945  DOA: 02/24/2022     5 PCP: Bonnita Hollow, MD  Initial CC: weakness  Hospital Course: Jared White is a 76 y.o. male with PMH CAD s/p CABG, CKD3A, HTN, HLD,A.fib on Eliquis, recurrent rectal adenocarcinoma with mets to lung s/p reversal of diverting ileostomy now with diverting loop colostomy who presented with weakness and inability to care for himself.  He was admitted for failure to thrive, therapy evaluations and acute metabolic encephalopathy. Patient failed to improve, had worsening confusion, and overall progressive functional decline. He was evaluated by palliative care with recommendations for SNF with hospice.  Discussions were also held with his POA who also was amenable with pursuing transitioning to comfort mentality.   Interval History:  No events overnight.  No further sitter or mittens have been needed.  Friend bedside when seen this afternoon. [place] chosen; Paramedic.   Assessment and Plan:  Generalized weakness Failure to thrive Likely continual decline from underlying malignancy and dehydration Palliative care consulted , appreciate recommendations; SNF with hospice then eventually home with hospice in care of [place], [place] - Dr Alen Blew is aware and agrees with the plan   Acute metabolic encephalopathy - Appears disoriented at times and delirious with sun downing in the setting of moderate dementia - MRI brain is negative for stroke.  - continue seroquel - d/c sitter morning of 10/11 and avoid mittens use as able - EKG repeated on 10/11; minimally prolonged QTc 465 - okay for PRN haldol if necessary  Colostomy care [facility]) Colostomy care per RN   PAF (paroxysmal atrial fibrillation) ([place]) - de-escalating meds as able - d/c eliquis (high fall risk) and atenolol given pursuit of comfort at this time - continue cardizem; adjustment as necessary    Rectal adenocarcinoma  metastatic to lung ([place]) S/p LAR, Diverting loop colostomy - S/p SBRT for pulmonary nodules with last session on 02/10/2022.   -Continue hospice upon discharge to SNF and eventually if goes home   Stage 3b chronic kidney disease ([place]) -S/p IV fluids   Hypocalcemia Replaced.    Hypokalemia , Hypomagnesemia and hypophosphatemia Replaced   S/P CABG x 4 No chest pain or sob.    Hyperlipidemia - d/c statin as no further mortality benefit     Lactic acidosis Suspect from dehydration.  Normalized.    Mild anemia and thrombocytopenia - continue comfort care   Elevated troponin from demand ischemia from dehydration.  Denies chest pain    Diarrhea / Loose bowel movements:  C diff pcr is negative Interventions: Ensure Enlive (each supplement provides 350kcal and 20 grams of protein), MVI   Estimated body mass index is 17.49 kg/m as calculated from the following:   Height as of this encounter: '5\' 8"'$  (1.727 m).   Weight as of this encounter: 52.2 kg.    Old records reviewed in assessment of this patient  Antimicrobials:   DVT prophylaxis:     Code Status:   Code Status: DNR  Mobility Assessment (last 72 hours)     Mobility Assessment     Row Name 03/03/22 0810 03/02/22 1941 03/02/22 1526 03/02/22 1045 03/01/22 2005   Does patient have an order for bedrest or is patient medically unstable No - Continue assessment No - Continue assessment -- No - Continue assessment No - Continue assessment   What is the highest level of mobility based on the progressive mobility assessment? Level 5 (  Walks with assist in room/hall) - Balance while stepping forward/back and can walk in room with assist - Complete Level 5 (Walks with assist in room/hall) - Balance while stepping forward/back and can walk in room with assist - Complete Level 5 (Walks with assist in room/hall) - Balance while stepping forward/back and can walk in room with assist - Complete Level 5 (Walks with assist in  room/hall) - Balance while stepping forward/back and can walk in room with assist - Complete Level 5 (Walks with assist in room/hall) - Balance while stepping forward/back and can walk in room with assist - Complete   Is the above level different from baseline mobility prior to current illness? No - Consider discontinuing PT/OT -- -- Yes - Recommend PT order --    [place] Name 03/01/22 1100 02/28/22 2038         Does patient have an order for bedrest or is patient medically unstable No - Continue assessment No - Continue assessment      What is the highest level of mobility based on the progressive mobility assessment? Level 5 (Walks with assist in room/hall) - Balance while stepping forward/back and can walk in room with assist - Complete Level 5 (Walks with assist in room/hall) - Balance while stepping forward/back and can walk in room with assist - Complete      Is the above level different from baseline mobility prior to current illness? Yes - Recommend PT order --               Barriers to discharge:  Disposition Plan:  SNF - [place] Status is: Inpt  Objective: Blood pressure (!) 140/72, pulse 83, temperature 97.8 F (36.6 C), temperature source Oral, resp. rate 20, height '5\' 8"'$  (1.727 m), weight 52.2 kg, SpO2 100 %.  Examination:  Physical Exam Constitutional:      General: He is not in acute distress. HENT:     Head: Normocephalic and atraumatic.     Mouth/Throat:     Mouth: Mucous membranes are moist.  Eyes:     Extraocular Movements: Extraocular movements intact.  Cardiovascular:     Rate and Rhythm: Normal rate and regular rhythm.  Pulmonary:     Effort: Pulmonary effort is normal.     Breath sounds: Normal breath sounds.  Abdominal:     General: Bowel sounds are normal.     Palpations: Abdomen is soft.  Musculoskeletal:        General: Normal range of motion.     Cervical back: Normal range of motion.  Skin:    General: Skin is warm and dry.  Neurological:      Mental Status: He is alert.     Comments: follows commands, moves all 4 extremities      Consultants:  Palliative care  Procedures:    Data Reviewed: No results found for this or any previous visit (from the past 24 hour(s)).  I have Reviewed nursing notes, Vitals, and Lab results since pt's last encounter. Pertinent lab results : see above I have reviewed the last note from staff over past 24 hours I have discussed pt's care plan and test results with nursing staff, case manager   LOS: 5 days   Dwyane Dee, MD [facility] 03/03/2022, 4:18 PM

## 2022-03-03 NOTE — Progress Notes (Signed)
Mobility Specialist Cancellation/Refusal Note:   03/03/22 1201  Mobility  Activity Refused mobility     Reason for Cancellation/Refusal: Pt declined mobility at this time. Pt not feeling well at this time. Requested afternoon ambulation.  Will check back as schedule permits.       [facility]

## 2022-03-04 MED ORDER — QUETIAPINE FUMARATE 25 MG PO TABS: 25.0000 mg | ORAL_TABLET | Freq: Every day | ORAL | Status: DC

## 2022-03-04 NOTE — TOC Progression Note (Signed)
Transition of Care [facility]) - Progression Note    Patient Details  Name: Jared White MRN: [id number] Date of Birth: [birth year]  Transition of Care [facility]) CM/SW Contact  Hayze Gazda, Juliann Pulse, RN Phone Number: 03/04/2022, 1:08 PM  Clinical Narrative:Received Candace Cruise [id number] auth till 10/16 for [facility] await d/c summary if stable. MD notified.       Expected Discharge Plan: [place] Barriers to Discharge: Continued Medical Work up  Expected Discharge Plan and Services Expected Discharge Plan: [place] Choice: [place] arrangements for the past 2 months: Single Family Home                                       Social Determinants of Health (SDOH) Interventions    Readmission Risk Interventions    01/02/2022    9:59 AM 08/26/2020    1:33 PM  Readmission Risk Prevention Plan  Transportation Screening Complete Complete  PCP or Specialist Appt within 3-5 Days Complete Complete  HRI or Home Care Consult Complete Complete  Social Work Consult for [place] Planning/Counseling Complete Complete  Palliative Care Screening Not Applicable Not Applicable  Medication Review Press photographer) Complete Complete

## 2022-03-04 NOTE — Discharge Summary (Signed)
Physician Discharge Summary   Jared White QTM:[id number] DOB: [birth year] DOA: 02/24/2022  PCP: Bonnita Hollow, MD  Admit date: 02/24/2022 Discharge date: 03/04/2022 Barriers to discharge: none  Admitted From: Home Disposition:  SNF Discharging physician: Dwyane Dee, MD  Recommendations for Outpatient Follow-up:  Continue hospice/comfort care  Home Health:  Equipment/Devices:   Discharge Condition: stable CODE STATUS: DNR Diet recommendation:  Diet Orders (From admission, onward)     Start     Ordered   03/04/22 0000  Diet general        03/04/22 1342   02/27/22 1842  Diet regular Room service appropriate? No; Fluid consistency: Thin  Diet effective now       Question Answer Comment  Room service appropriate? No   Fluid consistency: Thin      02/27/22 1842            Hospital Course: Jared White is a 76 y.o. male with PMH CAD s/p CABG, CKD3A, HTN, HLD,A.fib on Eliquis, recurrent rectal adenocarcinoma with mets to lung s/p reversal of diverting ileostomy now with diverting loop colostomy who presented with weakness and inability to care for himself.  He was admitted for failure to thrive, therapy evaluations and acute metabolic encephalopathy. Patient failed to improve, had worsening confusion, and overall progressive functional decline. He was evaluated by palliative care with recommendations for SNF with hospice.  Discussions were also held with his POA who also was amenable with pursuing transitioning to comfort mentality.   Assessment and Plan:  Generalized weakness Failure to thrive Likely continual decline from underlying malignancy and dehydration Palliative care consulted , appreciate recommendations; SNF with hospice then eventually home with hospice in care of [place], [place] - Dr Alen Blew is aware and agrees with the plan   Acute metabolic encephalopathy - Appears disoriented at times and delirious with sun downing in the setting of moderate  dementia - MRI brain is negative for stroke.  - continue seroquel   Colostomy care [facility]) Colostomy care per RN   PAF (paroxysmal atrial fibrillation) ([place]) - d/c eliquis (high fall risk) and atenolol given pursuit of comfort at this time - continue cardizem   Rectal adenocarcinoma metastatic to lung [facility]) S/p LAR, Diverting loop colostomy - S/p SBRT for pulmonary nodules with last session on 02/10/2022.   -Continue hospice upon discharge to SNF and eventually if goes home   Stage 3b chronic kidney disease ([place]) -S/p IV fluids   Hypocalcemia Replaced.    Hypokalemia , Hypomagnesemia and hypophosphatemia Replaced   S/P CABG x 4 No chest pain or sob.    Hyperlipidemia - d/c statin as no further mortality benefit     Lactic acidosis Suspect from dehydration.  Normalized.    Mild anemia and thrombocytopenia - continue comfort care   Elevated troponin from demand ischemia from dehydration.  Denies chest pain    Diarrhea / Loose bowel movements:  C diff pcr is negative Interventions: Ensure Enlive (each supplement provides 350kcal and 20 grams of protein), MVI   Estimated body mass index is 17.49 kg/m as calculated from the following:   Height as of this encounter: '5\' 8"'$  (1.727 m).   Weight as of this encounter: 52.2 kg.   Principal Diagnosis: Weakness  Discharge Diagnoses: Active Hospital Problems   Diagnosis Date Noted   Weakness 02/24/2022   Failure to thrive in adult 62/56/3893   Acute metabolic encephalopathy 73/42/8768   Colostomy care ([place])    PAF (paroxysmal atrial fibrillation) ([place])  Rectal adenocarcinoma metastatic to lung ([place]) 08/19/2019   Stage 3b chronic kidney disease ([place]) 09/13/2018   Hypocalcemia 10/08/2015   S/P CABG x 4 03/14/2013   CAD (coronary artery disease) 12/15/2012   Hyperlipidemia 12/15/2012    Resolved Hospital Problems  No resolved problems to display.     Discharge Instructions     Diet general   Complete by: As  directed    Increase activity slowly   Complete by: As directed       Allergies as of 03/04/2022   No Known Allergies      Medication List     STOP taking these medications    atenolol 50 MG tablet Commonly known as: TENORMIN   CALCIUM 500 PO   cyanocobalamin 1000 MCG tablet Commonly known as: VITAMIN B12   diltiazem 120 MG 24 hr capsule Commonly known as: TIAZAC   Eliquis 2.5 MG Tabs tablet Generic drug: apixaban   fludrocortisone 0.1 MG tablet Commonly known as: FLORINEF   MAGNESIUM PO   nitroGLYCERIN 0.4 MG SL tablet Commonly known as: NITROSTAT   omeprazole 40 MG capsule Commonly known as: PRILOSEC   oxyCODONE 5 MG immediate release tablet Commonly known as: Oxy IR/ROXICODONE   rosuvastatin 20 MG tablet Commonly known as: CRESTOR   VITAMIN A PO   VITAMIN D PO       TAKE these medications    diltiazem 120 MG 24 hr capsule Commonly known as: CARDIZEM CD Take 1 capsule (120 mg total) by mouth daily.   QUEtiapine 25 MG tablet Commonly known as: SEROQUEL Take 1 tablet (25 mg total) by mouth at bedtime.        Contact information for after-discharge care     Destination     [facility] Preferred SNF .   Service: Skilled Nursing Contact information: [address] [place] [phone]                    No Known Allergies  Consultations:   Procedures:   Discharge Exam: BP (!) 111/55 (BP Location: Right Arm)   Pulse (!) 106   Temp 98 F (36.7 C)   Resp 18   Ht '5\' 8"'$  (1.727 m)   Wt 52.2 kg   SpO2 99%   BMI 17.49 kg/m  Physical Exam Constitutional:      General: He is not in acute distress. HENT:     Head: Normocephalic and atraumatic.     Mouth/Throat:     Mouth: Mucous membranes are moist.  Eyes:     Extraocular Movements: Extraocular movements intact.  Cardiovascular:     Rate and Rhythm: Normal rate and regular rhythm.  Pulmonary:     Effort: Pulmonary effort  is normal.     Breath sounds: Normal breath sounds.  Abdominal:     General: Bowel sounds are normal.     Palpations: Abdomen is soft.  Musculoskeletal:        General: Normal range of motion.     Cervical back: Normal range of motion.  Skin:    General: Skin is warm and dry.  Neurological:     Mental Status: He is alert.     Comments: follows commands, moves all 4 extremities      The results of significant diagnostics from this hospitalization (including imaging, microbiology, ancillary and laboratory) are listed below for reference.   Microbiology: Recent Results (from the past 240 hour(s))  Resp Panel by RT-PCR (Flu A&B, Covid)  Anterior Nasal Swab     Status: None   Collection Time: 02/24/22  6:28 PM   Specimen: Anterior Nasal Swab  Result Value Ref Range Status   SARS Coronavirus 2 by RT PCR NEGATIVE NEGATIVE Final    Comment: (NOTE) SARS-CoV-2 target nucleic acids are NOT DETECTED.  The SARS-CoV-2 RNA is generally detectable in upper respiratory specimens during the acute phase of infection. The lowest concentration of SARS-CoV-2 viral copies this assay can detect is 138 copies/mL. A negative result does not preclude SARS-Cov-2 infection and should not be used as the sole basis for treatment or other patient management decisions. A negative result may occur with  improper specimen collection/handling, submission of specimen other than nasopharyngeal swab, presence of viral mutation(s) within the areas targeted by this assay, and inadequate number of viral copies(<138 copies/mL). A negative result must be combined with clinical observations, patient history, and epidemiological information. The expected result is Negative.  Fact Sheet for Patients:  EntrepreneurPulse.com.au  Fact Sheet for Healthcare Providers:  IncredibleEmployment.be  This test is no t yet approved or cleared by the [place] FDA and  has been authorized for  detection and/or diagnosis of SARS-CoV-2 by FDA under an Emergency Use Authorization (EUA). This EUA will remain  in effect (meaning this test can be used) for the duration of the COVID-19 declaration under Section 564(b)(1) of the Act, 21 U.S.C.section 360bbb-3(b)(1), unless the authorization is terminated  or revoked sooner.       Influenza A by PCR NEGATIVE NEGATIVE Final   Influenza B by PCR NEGATIVE NEGATIVE Final    Comment: (NOTE) The Xpert Xpress SARS-CoV-2/FLU/RSV plus assay is intended as an aid in the diagnosis of influenza from Nasopharyngeal swab specimens and should not be used as a sole basis for treatment. Nasal washings and aspirates are unacceptable for Xpert Xpress SARS-CoV-2/FLU/RSV testing.  Fact Sheet for Patients: EntrepreneurPulse.com.au  Fact Sheet for Healthcare Providers: IncredibleEmployment.be  This test is not yet approved or cleared by the [place] FDA and has been authorized for detection and/or diagnosis of SARS-CoV-2 by FDA under an Emergency Use Authorization (EUA). This EUA will remain in effect (meaning this test can be used) for the duration of the COVID-19 declaration under Section 564(b)(1) of the Act, 21 U.S.C. section 360bbb-3(b)(1), unless the authorization is terminated or revoked.  Performed at [facility], [place] [address], [place], [place] [address]   Culture, blood (routine x 2)     Status: None   Collection Time: 02/24/22  6:35 PM   Specimen: BLOOD  Result Value Ref Range Status   Specimen Description   Final    BLOOD RIGHT ANTECUBITAL Performed at [place] [address] [place], [place] [address]    Special Requests   Final    BOTTLES DRAWN AEROBIC AND ANAEROBIC Blood Culture adequate volume Performed at [place] [address] [place], [place] [address]    Culture   Final    NO GROWTH 5 DAYS Performed at [facility], [place] [address], [place], [place] [address]    Report Status 03/01/2022 FINAL  Final  Culture, blood (routine x 2)     Status: None   Collection Time: 02/24/22  8:10 PM   Specimen: BLOOD  Result Value Ref Range Status   Specimen Description   Final    BLOOD LEFT ANTECUBITAL Performed at [place] [address] [place], [place] [address]    Special  Requests   Final    BOTTLES DRAWN AEROBIC AND ANAEROBIC Blood Culture results may not be optimal due to an excessive volume of blood received in culture bottles Performed at [place] [address], [place], [place] [address]    Culture   Final    NO GROWTH 5 DAYS Performed at [facility], [place] [address], [place], [place] [address]    Report Status 03/02/2022 FINAL  Final  C Difficile Quick Screen w PCR reflex     Status: None   Collection Time: 02/25/22 12:37 PM   Specimen: STOOL  Result Value Ref Range Status   C Diff antigen NEGATIVE NEGATIVE Final   C Diff toxin NEGATIVE NEGATIVE Final   C Diff interpretation No C. difficile detected.  Final    Comment: Performed at [facility], [place] [address], [place], [place] [address]     Labs: BNP (last 3 results) No results for input(s): "BNP" in the last 8760 hours. Basic Metabolic Panel: Recent Labs  Lab 02/26/22 0327 02/27/22 1042 02/28/22 0224  NA 138 138 137  K 3.8 3.3* 4.2  CL 109 109 109  CO2 21* 24 25  GLUCOSE 101* 173* 109*  BUN 25* 18 21  CREATININE 1.07 0.94 1.05  CALCIUM 7.5* 8.2* 8.3*  MG 1.5* 1.6* 2.3  PHOS 2.3* 2.5  --    Liver Function Tests: Recent Labs  Lab 02/26/22 0327  AST 39  ALT 15  ALKPHOS 62  BILITOT 1.0  PROT 5.9*  ALBUMIN 3.1*   No results for input(s): "LIPASE", "AMYLASE" in the last 168 hours. No results for input(s): "AMMONIA" in the last 168 hours. CBC: Recent Labs  Lab 02/26/22 0327  WBC 8.5  NEUTROABS 6.0  HGB 10.2*  HCT 29.9*  MCV 94.3   PLT 123*   Cardiac Enzymes: No results for input(s): "CKTOTAL", "CKMB", "CKMBINDEX", "TROPONINI" in the last 168 hours. BNP: Invalid input(s): "POCBNP" CBG: No results for input(s): "GLUCAP" in the last 168 hours. D-Dimer No results for input(s): "DDIMER" in the last 72 hours. Hgb A1c No results for input(s): "HGBA1C" in the last 72 hours. Lipid Profile No results for input(s): "CHOL", "HDL", "LDLCALC", "TRIG", "CHOLHDL", "LDLDIRECT" in the last 72 hours. Thyroid function studies No results for input(s): "TSH", "T4TOTAL", "T3FREE", "THYROIDAB" in the last 72 hours.  Invalid input(s): "FREET3" Anemia work up No results for input(s): "VITAMINB12", "FOLATE", "FERRITIN", "TIBC", "IRON", "RETICCTPCT" in the last 72 hours. Urinalysis    Component Value Date/Time   COLORURINE YELLOW 02/24/2022 0013   APPEARANCEUR CLOUDY (A) 02/24/2022 0013   LABSPEC 1.014 02/24/2022 0013   PHURINE 6.0 02/24/2022 0013   GLUCOSEU NEGATIVE 02/24/2022 0013   HGBUR SMALL (A) 02/24/2022 0013   BILIRUBINUR NEGATIVE 02/24/2022 0013   BILIRUBINUR negative 01/21/2016 1040   KETONESUR 20 (A) 02/24/2022 0013   PROTEINUR >=300 (A) 02/24/2022 0013   UROBILINOGEN 0.2 01/21/2016 1040   UROBILINOGEN 1 11/21/2013 1008   NITRITE NEGATIVE 02/24/2022 0013   LEUKOCYTESUR LARGE (A) 02/24/2022 0013   Sepsis Labs Recent Labs  Lab 02/26/22 0327  WBC 8.5   Microbiology Recent Results (from the past 240 hour(s))  Resp Panel by RT-PCR (Flu A&B, Covid) Anterior Nasal Swab     Status: None   Collection Time: 02/24/22  6:28 PM   Specimen: Anterior Nasal Swab  Result Value Ref Range Status   SARS Coronavirus 2 by RT PCR NEGATIVE NEGATIVE Final    Comment: (NOTE) SARS-CoV-2 target  nucleic acids are NOT DETECTED.  The SARS-CoV-2 RNA is generally detectable in upper respiratory specimens during the acute phase of infection. The lowest concentration of SARS-CoV-2 viral copies this assay can detect is 138 copies/mL. A  negative result does not preclude SARS-Cov-2 infection and should not be used as the sole basis for treatment or other patient management decisions. A negative result may occur with  improper specimen collection/handling, submission of specimen other than nasopharyngeal swab, presence of viral mutation(s) within the areas targeted by this assay, and inadequate number of viral copies(<138 copies/mL). A negative result must be combined with clinical observations, patient history, and epidemiological information. The expected result is Negative.  Fact Sheet for Patients:  EntrepreneurPulse.com.au  Fact Sheet for Healthcare Providers:  IncredibleEmployment.be  This test is no t yet approved or cleared by the [place] FDA and  has been authorized for detection and/or diagnosis of SARS-CoV-2 by FDA under an Emergency Use Authorization (EUA). This EUA will remain  in effect (meaning this test can be used) for the duration of the COVID-19 declaration under Section 564(b)(1) of the Act, 21 U.S.C.section 360bbb-3(b)(1), unless the authorization is terminated  or revoked sooner.       Influenza A by PCR NEGATIVE NEGATIVE Final   Influenza B by PCR NEGATIVE NEGATIVE Final    Comment: (NOTE) The Xpert Xpress SARS-CoV-2/FLU/RSV plus assay is intended as an aid in the diagnosis of influenza from Nasopharyngeal swab specimens and should not be used as a sole basis for treatment. Nasal washings and aspirates are unacceptable for Xpert Xpress SARS-CoV-2/FLU/RSV testing.  Fact Sheet for Patients: EntrepreneurPulse.com.au  Fact Sheet for Healthcare Providers: IncredibleEmployment.be  This test is not yet approved or cleared by the [place] FDA and has been authorized for detection and/or diagnosis of SARS-CoV-2 by FDA under an Emergency Use Authorization (EUA). This EUA will remain in effect (meaning this test can  be used) for the duration of the COVID-19 declaration under Section 564(b)(1) of the Act, 21 U.S.C. section 360bbb-3(b)(1), unless the authorization is terminated or revoked.  Performed at [facility], [place] [address] [place], [place] [address]   Culture, blood (routine x 2)     Status: None   Collection Time: 02/24/22  6:35 PM   Specimen: BLOOD  Result Value Ref Range Status   Specimen Description   Final    BLOOD RIGHT ANTECUBITAL Performed at [place] [address] [place], [place] [address]    Special Requests   Final    BOTTLES DRAWN AEROBIC AND ANAEROBIC Blood Culture adequate volume Performed at [place] [address] [place], [place] [address]    Culture   Final    NO GROWTH 5 DAYS Performed at [facility], [place] [address] [place], [place] [address]    Report Status 03/01/2022 FINAL  Final  Culture, blood (routine x 2)     Status: None   Collection Time: 02/24/22  8:10 PM   Specimen: BLOOD  Result Value Ref Range Status   Specimen Description   Final    BLOOD LEFT ANTECUBITAL Performed at [place] [address] [place], [place] [address]    Special Requests   Final    BOTTLES DRAWN AEROBIC AND ANAEROBIC Blood Culture results may not be optimal due to an excessive volume of blood received in culture bottles Performed at [place] [address], [place], [place] [address]    Culture  Final    NO GROWTH 5 DAYS Performed at [facility], [place] [address], [place], [place] [address]    Report Status 03/02/2022 FINAL  Final  C Difficile Quick Screen w PCR reflex     Status: None   Collection Time: 02/25/22 12:37 PM   Specimen: STOOL  Result Value Ref Range Status   C Diff antigen NEGATIVE NEGATIVE Final   C Diff toxin NEGATIVE NEGATIVE Final   C Diff interpretation No C. difficile detected.  Final    Comment: Performed at [facility], [place] [address], [place],  [address]    Procedures/Studies: MR BRAIN WO CONTRAST  Result Date: 02/25/2022 CLINICAL DATA:  Disoriented, memory difficulties. EXAM: MRI HEAD WITHOUT CONTRAST TECHNIQUE: Multiplanar, multiecho pulse sequences of the brain and surrounding structures were obtained without intravenous contrast. COMPARISON:  CT head 11/21/2021 FINDINGS: The patient could not be completed due to patient comfort. Axial DWI and sagittal T1 sequences were obtained. Brain: There is no diffusion signal abnormality to suggest acute infarct. There is no definite evidence of intracranial hemorrhage or extra-axial fluid collection on the provided sequences Parenchymal volume is normal. The ventricles are normal in size. There is no large mass lesion or midline shift. Vascular: Not well assessed. Skull and upper cervical spine: Normal marrow signal. Sinuses/Orbits: Grossly unremarkable. Other: None. IMPRESSION: Incomplete study with only axial DWI and sagittal T1 sequences obtained. No evidence of acute infarct. No large mass lesion or midline shift. Repeat study may be considered when the patient can tolerate. Electronically Signed   By: Valetta Mole M.D.   On: 02/25/2022 14:30   DG Abd Portable 1V  Result Date: 02/25/2022 CLINICAL DATA:  Sudden onset abdominal pain, initial encounter EXAM: PORTABLE ABDOMEN - 1 VIEW COMPARISON:  11/21/2021 FINDINGS: Scattered large and small bowel gas is noted. No obstructive changes are seen. Colostomy is noted in the left mid abdomen. Degenerative changes of lumbar spine are noted and stable. IMPRESSION: No acute abnormality noted. Electronically Signed   By: Inez Catalina M.D.   On: 02/25/2022 01:54   DG Chest Port 1 View  Result Date: 02/24/2022 CLINICAL DATA:  Stage IV colon cancer, weakness, loss of appetite EXAM: PORTABLE CHEST 1 VIEW COMPARISON:  11/30/2021 FINDINGS: Single frontal view of the chest demonstrates a stable right chest wall  port. Postsurgical changes from CABG. Cardiac silhouette is unremarkable. No airspace disease, effusion, or pneumothorax. Prior healed right lateral fourth rib fracture. No acute bony abnormality. IMPRESSION: 1. Stable chest, no acute process. Electronically Signed   By: Randa Ngo M.D.   On: 02/24/2022 19:30     Time coordinating discharge: Over 30 minutes    Dwyane Dee, MD  [facility] 03/04/2022, 1:42 PM

## 2022-03-04 NOTE — Progress Notes (Signed)
Mobility Specialist Cancellation/Refusal Note:    03/04/22 1051  Mobility  Activity Refused mobility     Reason for Cancellation/Refusal: Pt declined mobility at this time. Pt states he has been weak. Will check back as schedule permits.    [facility]

## 2022-03-04 NOTE — Progress Notes (Signed)
Report called to [facility] receiving nurse. All questions & concerns addressed. PTAR called by night RN. AVS printed & with discharge packet.

## 2022-03-07 ENCOUNTER — Other Ambulatory Visit: Payer: Medicare Other | Admitting: Internal Medicine

## 2022-03-07 ENCOUNTER — Telehealth: Payer: Self-pay

## 2022-03-07 NOTE — Telephone Encounter (Signed)
Transition Care Management Unsuccessful Follow-up Telephone Call  Date of discharge and from where:  [facility]04/[facility]  Attempts:  1st Attempt  Reason for unsuccessful TCM follow-up call:  Left voice message   Please schedule pt for a HOSP F/U and transfer to my office ext. [phone] for TOC questions.

## 2022-03-09 ENCOUNTER — Telehealth: Payer: Self-pay | Admitting: Family Medicine

## 2022-03-09 NOTE — Telephone Encounter (Signed)
Pt brother in law called and said pt can't do a hospital follow up because pt is in hospice . Bertell [phone] ) if any questions

## 2022-03-11 NOTE — Telephone Encounter (Signed)
Transition Care Management Follow-up Telephone Call Date of discharge and from where: 03/04/22 [facility] How have you been since you were released from the hospital? I'm ok. Any questions or concerns? No  Items Reviewed: Did the pt receive and understand the discharge instructions provided? Yes  Medications obtained and verified? No  Other? No  Any new allergies since your discharge? No  Dietary orders reviewed? No Do you have support at home? Yes   Home Care and Equipment/Supplies: Were home health services ordered? not applicable If so, what is the name of the agency? N/a  Has the agency set up a time to come to the patient's home? not applicable Were any new equipment or medical supplies ordered?  No What is the name of the medical supply agency? N/a Were you able to get the supplies/equipment? not applicable Do you have any questions related to the use of the equipment or supplies? No  Functional Questionnaire: (I = Independent and D = Dependent) ADLs: I  Bathing/Dressing- I  Meal Prep- I  Eating- I  Maintaining continence- I  Transferring/Ambulation- I  Managing Meds- I  Follow up appointments reviewed:  PCP Hospital f/u appt confirmed? No  Scheduled to see N/A on N/A @ N/A. [facility] f/u appt confirmed? No  Scheduled to see N/A on N/A @ N/A. Are transportation arrangements needed? No  If their condition worsens, is the pt aware to call PCP or go to the Emergency Dept.? Yes Was the patient provided with contact information for the PCP's office or ED? Yes Was to pt encouraged to call back with questions or concerns? Yes  Angeline Slim, RN, BSN

## 2022-03-18 ENCOUNTER — Ambulatory Visit: Payer: Medicare Other | Admitting: Cardiology

## 2022-03-19 ENCOUNTER — Emergency Department (HOSPITAL_COMMUNITY)
Admission: EM | Admit: 2022-03-19 | Discharge: 2022-03-20 | Disposition: A | Discharge status: Home / Self Care | Attending: Emergency Medicine | Admitting: Emergency Medicine

## 2022-03-19 DIAGNOSIS — I10 Essential (primary) hypertension: Secondary | ICD-10-CM | POA: Diagnosis not present

## 2022-03-19 DIAGNOSIS — Z85048 Personal history of other malignant neoplasm of rectum, rectosigmoid junction, and anus: Secondary | ICD-10-CM | POA: Insufficient documentation

## 2022-03-19 DIAGNOSIS — R112 Nausea with vomiting, unspecified: Secondary | ICD-10-CM | POA: Diagnosis not present

## 2022-03-19 DIAGNOSIS — R531 Weakness: Secondary | ICD-10-CM | POA: Diagnosis not present

## 2022-03-19 DIAGNOSIS — Z951 Presence of aortocoronary bypass graft: Secondary | ICD-10-CM | POA: Insufficient documentation

## 2022-03-19 DIAGNOSIS — N3000 Acute cystitis without hematuria: Secondary | ICD-10-CM

## 2022-03-19 DIAGNOSIS — Z85118 Personal history of other malignant neoplasm of bronchus and lung: Secondary | ICD-10-CM | POA: Insufficient documentation

## 2022-03-19 DIAGNOSIS — I2510 Atherosclerotic heart disease of native coronary artery without angina pectoris: Secondary | ICD-10-CM | POA: Diagnosis not present

## 2022-03-19 DIAGNOSIS — R1084 Generalized abdominal pain: Secondary | ICD-10-CM | POA: Diagnosis present

## 2022-03-19 DIAGNOSIS — K529 Noninfective gastroenteritis and colitis, unspecified: Secondary | ICD-10-CM

## 2022-03-20 ENCOUNTER — Encounter (HOSPITAL_COMMUNITY): Payer: Self-pay

## 2022-03-20 ENCOUNTER — Emergency Department (HOSPITAL_COMMUNITY)

## 2022-03-20 DIAGNOSIS — R1084 Generalized abdominal pain: Secondary | ICD-10-CM | POA: Diagnosis not present

## 2022-03-20 LAB — COMPREHENSIVE METABOLIC PANEL
ALT: 13 U/L (ref 0–44)
AST: 24 U/L (ref 15–41)
Albumin: 3 g/dL — ABNORMAL LOW (ref 3.5–5.0)
Alkaline Phosphatase: 63 U/L (ref 38–126)
Anion gap: 11 (ref 5–15)
BUN: 19 mg/dL (ref 8–23)
CO2: 23 mmol/L (ref 22–32)
Calcium: 7.8 mg/dL — ABNORMAL LOW (ref 8.9–10.3)
Chloride: 107 mmol/L (ref 98–111)
Creatinine, Ser: 1.18 mg/dL (ref 0.61–1.24)
GFR, Estimated: >60 mL/min (ref >60)
Glucose, Bld: 118 mg/dL — ABNORMAL HIGH (ref 70–99)
Potassium: 3.3 mmol/L — ABNORMAL LOW (ref 3.5–5.1)
Sodium: 141 mmol/L (ref 135–145)
Total Bilirubin: 0.8 mg/dL (ref 0.3–1.2)
Total Protein: 6.4 g/dL — ABNORMAL LOW (ref 6.5–8.1)

## 2022-03-20 LAB — CBC WITH DIFFERENTIAL/PLATELET
Abs Immature Granulocytes: 0.02 10*3/uL (ref 0.00–0.07)
Basophils Absolute: 0 10*3/uL (ref 0.0–0.1)
Basophils Relative: 0 %
Eosinophils Absolute: 0 10*3/uL (ref 0.0–0.5)
Eosinophils Relative: 0 %
HCT: 31.3 % — ABNORMAL LOW (ref 39.0–52.0)
Hemoglobin: 10.5 g/dL — ABNORMAL LOW (ref 13.0–17.0)
Immature Granulocytes: 0 %
Lymphocytes Relative: 12 %
Lymphs Abs: 0.7 10*3/uL (ref 0.7–4.0)
MCH: 31.3 pg (ref 26.0–34.0)
MCHC: 33.5 g/dL (ref 30.0–36.0)
MCV: 93.4 fL (ref 80.0–100.0)
Monocytes Absolute: 0.4 10*3/uL (ref 0.1–1.0)
Monocytes Relative: 7 %
Neutro Abs: 4.5 10*3/uL (ref 1.7–7.7)
Neutrophils Relative %: 81 %
Platelets: 157 10*3/uL (ref 150–400)
RBC: 3.35 MIL/uL — ABNORMAL LOW (ref 4.22–5.81)
RDW: 14.3 % (ref 11.5–15.5)
WBC: 5.6 10*3/uL (ref 4.0–10.5)
nRBC: 0 % (ref 0.0–0.2)

## 2022-03-20 LAB — URINALYSIS, ROUTINE W REFLEX MICROSCOPIC
Bilirubin Urine: NEGATIVE
Glucose, UA: NEGATIVE mg/dL
Ketones, ur: NEGATIVE mg/dL
Nitrite: NEGATIVE
Protein, ur: 100 mg/dL — AB
Specific Gravity, Urine: 1.015 (ref 1.005–1.030)
pH: 7.5 (ref 5.0–8.0)

## 2022-03-20 LAB — URINALYSIS, MICROSCOPIC (REFLEX): Squamous Epithelial / HPF: NONE SEEN (ref 0–5)

## 2022-03-20 LAB — LIPASE, BLOOD: Lipase: 33 U/L (ref 11–51)

## 2022-03-20 MED ORDER — ONDANSETRON 8 MG PO TBDP
8.0000 mg | ORAL_TABLET | Freq: Once | ORAL | Status: AC
  Administered 2022-03-20: 8 mg via ORAL
  Filled 2022-03-20: qty 1

## 2022-03-20 MED ORDER — ONDANSETRON 8 MG PO TBDP: ORAL_TABLET | ORAL | 0 refills | Status: DC

## 2022-03-20 MED ORDER — SODIUM CHLORIDE 0.9 % IV BOLUS
1000.0000 mL | Freq: Once | INTRAVENOUS | Status: AC
  Administered 2022-03-20: 1000 mL via INTRAVENOUS

## 2022-03-20 MED ORDER — IOHEXOL 300 MG/ML  SOLN
100.0000 mL | Freq: Once | INTRAMUSCULAR | Status: AC | PRN
  Administered 2022-03-20: 100 mL via INTRAVENOUS

## 2022-03-20 MED ORDER — LIDOCAINE VISCOUS HCL 2 % MT SOLN
15.0000 mL | Freq: Once | OROMUCOSAL | Status: AC
  Administered 2022-03-20: 15 mL via ORAL
  Filled 2022-03-20: qty 15

## 2022-03-20 MED ORDER — CEPHALEXIN 500 MG PO CAPS
500.0000 mg | ORAL_CAPSULE | Freq: Once | ORAL | Status: AC
  Administered 2022-03-20: 500 mg via ORAL
  Filled 2022-03-20: qty 1

## 2022-03-20 MED ORDER — MORPHINE SULFATE (PF) 4 MG/ML IV SOLN
4.0000 mg | Freq: Once | INTRAVENOUS | Status: AC
  Administered 2022-03-20: 4 mg via INTRAVENOUS
  Filled 2022-03-20: qty 1

## 2022-03-20 MED ORDER — ALUM & MAG HYDROXIDE-SIMETH 200-200-20 MG/5ML PO SUSP
30.0000 mL | Freq: Once | ORAL | Status: AC
  Administered 2022-03-20: 30 mL via ORAL
  Filled 2022-03-20: qty 30

## 2022-03-20 MED ORDER — CEPHALEXIN 500 MG PO CAPS: 500.0000 mg | ORAL_CAPSULE | Freq: Three times a day (TID) | ORAL | 0 refills | Status: DC

## 2022-03-20 NOTE — ED Provider Notes (Signed)
[facility] DEPT Provider Note   CSN: [id number] Arrival date & time: 03/19/22  2325     History  Chief Complaint  Patient presents with   Nausea   Emesis   Weakness    Pt has a hx of colon and lung cancer and noticed some N/V today. Approx for a few days. Pt noticed more weakness today. Denies fever. Also noted less amount of feces in colostomy bag. Pt Aox4, no N/V with EMS    Jared White is a 76 y.o. male.  Patient is a 76 year old male with extensive past medical history including colon cancer with colostomy, rectal cancer with lung metastasis, coronary artery disease with CABG x4, hyperlipidemia, hypertension, GERD.  Patient presenting today for evaluation of nausea, vomiting, and abdominal pain.  This has been a chronic issue, but has worsened over the past several days.  He describes decreased p.o. intake and decreased ostomy output.  He denies to me he is experiencing any fevers or chills.  He denies any ill contacts.  The history is provided by the patient.       Home Medications Prior to Admission medications   Medication Sig Start Date End Date Taking? Authorizing Provider  diltiazem (CARDIZEM CD) 120 MG 24 hr capsule Take 1 capsule (120 mg total) by mouth daily. Patient not taking: Reported on 02/24/2022 01/12/22 01/07/23  Nigel Mormon, MD  QUEtiapine (SEROQUEL) 25 MG tablet Take 1 tablet (25 mg total) by mouth at bedtime. 03/04/22   Dwyane Dee, MD      Allergies    Patient has no known allergies.    Review of Systems   Review of Systems  All other systems reviewed and are negative.   Physical Exam Updated Vital Signs BP (!) 164/87 (BP Location: Right Arm)   Pulse 78   Temp 98.3 F (36.8 C) (Oral)   Resp 18   Wt 53.5 kg   SpO2 100%   BMI 17.94 kg/m  Physical Exam Vitals and nursing note reviewed.  Constitutional:      General: He is not in acute distress.    Appearance: He is well-developed. He is not  diaphoretic.     Comments: Patient is a chronically ill-appearing 76 year old male in no acute distress.  HENT:     Head: Normocephalic and atraumatic.  Cardiovascular:     Rate and Rhythm: Normal rate and regular rhythm.     Heart sounds: No murmur heard.    No friction rub.  Pulmonary:     Effort: Pulmonary effort is normal. No respiratory distress.     Breath sounds: Normal breath sounds. No wheezing or rales.  Abdominal:     General: Bowel sounds are normal. There is no distension.     Palpations: Abdomen is soft.     Tenderness: There is abdominal tenderness. There is no guarding or rebound.     Comments: There is minimal brown stool in the ostomy.  There is mild generalized tenderness with no rebound or guarding.  Musculoskeletal:        General: Normal range of motion.     Cervical back: Normal range of motion and neck supple.  Skin:    General: Skin is warm and dry.  Neurological:     Mental Status: He is alert and oriented to person, place, and time.     Coordination: Coordination normal.     ED Results / Procedures / Treatments   Labs (all labs ordered are listed, but  only abnormal results are displayed) Labs Reviewed - No data to display  EKG None  Radiology No results found.  Procedures Procedures    Medications Ordered in ED Medications  sodium chloride 0.9 % bolus 1,000 mL (has no administration in time range)  ondansetron (ZOFRAN-ODT) disintegrating tablet 8 mg (has no administration in time range)  morphine (PF) 4 MG/ML injection 4 mg (has no administration in time range)    ED Course/ Medical Decision Making/ A&P  Patient is a 76 year old male with extensive past medical history as described in the HPI.  He presents with complaints of nausea, vomiting, and abdominal cramping.  Patient arrives here with stable vital signs and no fever.  Physical examination reveals a chronically ill-appearing, thin male in no acute distress.  He does have  generalized abdominal tenderness, however no peritoneal signs.  Work-up initiated including CBC, basic metabolic panel, lipase, and urinalysis.  Laboratory studies essentially unremarkable, but UA is suggestive of a UTI.  Also performed was a CT scan of the abdomen and pelvis which showed no evidence for bowel obstruction or other acute surgical process.  There was evidence of circumferential bladder wall thickening suggestive of UTI.  Patient will be treated with Keflex.  While in the ER, he was hydrated with normal saline and pain and nausea treated with morphine and Zofran.  He is now feeling much better and would prefer to go home.  I see no admittable diagnosis at this point and feel as though this disposition is appropriate.  Patient understands to return if symptoms worsen or change.  Final Clinical Impression(s) / ED Diagnoses Final diagnoses:  None    Rx / DC Orders ED Discharge Orders     None         Veryl Speak, MD 03/20/22 [phone]

## 2022-03-20 NOTE — Discharge Instructions (Signed)
Begin taking Keflex as prescribed.  Begin taking Zofran as prescribed as needed for nausea.  Clear liquids for the next 12 hours, then slowly advance diet as tolerated.  Return to the ER if your symptoms significantly worsen or change.

## 2022-03-23 ENCOUNTER — Telehealth: Payer: Self-pay

## 2022-03-23 NOTE — Telephone Encounter (Signed)
Transition Care Management Unsuccessful Follow-up Telephone Call  Date of discharge and from where:  03/20/22 [facility] ED. Dx: Gastroenteritis and Cystitis  Attempts:  1st Attempt  Reason for unsuccessful TCM follow-up call:  Unable to reach patient   Pt DC to Hospice per EC, Jill Poling.

## 2022-04-06 ENCOUNTER — Encounter (HOSPITAL_COMMUNITY): Payer: Self-pay | Admitting: Nurse Practitioner

## 2022-04-12 ENCOUNTER — Encounter: Payer: Self-pay | Admitting: Oncology

## 2022-04-12 NOTE — Progress Notes (Signed)
                                                                                                                                                             Patient Name: Jared White MRN: [id number] DOB: [birth year] Referring Physician: Josephine Igo Date of Service: 02/10/2022 [facility], [place]                                                        End Of Treatment Note  Diagnoses: C78.01-Secondary malignant neoplasm of right lung  Cancer Staging:  76 year old male with a solitary, 7 mm RUL lung metastasis secondary to his known adenocarcinoma of the rectum.   Intent: Curative  Radiation Treatment Dates: 02/03/2022 through 02/10/2022 Site Technique Total Dose (Gy) Dose per Fx (Gy) Completed Fx Beam Energies  Lung, Right: Lung_R IMRT 54/54 18 3/3 6XFFF   Narrative: The patient tolerated radiation therapy relatively well with only mild erythema of the skin in the treatment field and a dry, nonproductive cough.  Plan: The patient will receive a call in about one month from the radiation oncology department. He will continue follow up with Dr. Alen Blew, in medical oncology and Dr. Valeta Harms, in pulmonology, as well.   Nicholos Johns, PA-C    Tyler Pita, MD  [facility] Direct Dial: [phone]  Fax: [phone] [email or web address]  Skype  [email or web address]

## 2022-04-20 ENCOUNTER — Ambulatory Visit
Admission: RE | Admit: 2022-04-20 | Discharge: 2022-04-20 | Disposition: A | Discharge status: Home / Self Care | Payer: Medicare Other | Source: Ambulatory Visit | Attending: Urology | Admitting: Urology

## 2022-04-20 DIAGNOSIS — C7800 Secondary malignant neoplasm of unspecified lung: Secondary | ICD-10-CM

## 2022-04-20 NOTE — Progress Notes (Addendum)
  Radiation Oncology         [phone] ________________________________  Name: Theran Vandergrift Cline MRN: [id number]  Date of Service: 04/20/2022  DOB: 1946/02/01  Post Treatment Telephone Note  Diagnosis:  76 year old male with a solitary, 7 mm RUL lung metastasis secondary to his known adenocarcinoma of the rectum.   Intent: Curative  Radiation Treatment Dates: 02/03/2022 through 02/10/2022 Site Technique Total Dose (Gy) Dose per Fx (Gy) Completed Fx Beam Energies  Lung, Right: Lung_R        (as documented in provider EOT note)   The patient was not available for call today. A voicemail was left.   The patient was recently hospitalized with dehydration and failure to thrive with his declining condition and elected to transition to comfort care so he was discharged home with hospice on 03/04/2022.  Therefore, he will not need any further scheduled follow-up but was encouraged to call if he develops concerns or questions regarding radiation.  This concludes the interview.   Leandra Kern, LPN    Nicholos Johns, MMS, PA-C [facility] [place]: [phone]  Fax: [phone]

## 2022-04-22 DIAGNOSIS — Z743 Need for continuous supervision: Secondary | ICD-10-CM | POA: Diagnosis not present

## 2022-04-22 DIAGNOSIS — R000 Tachycardia, unspecified: Secondary | ICD-10-CM | POA: Diagnosis not present

## 2022-04-22 DIAGNOSIS — R110 Nausea: Secondary | ICD-10-CM | POA: Diagnosis not present

## 2022-04-22 DIAGNOSIS — R0689 Other abnormalities of breathing: Secondary | ICD-10-CM | POA: Diagnosis not present

## 2022-05-23 DEATH — deceased

## 2022-11-17 ENCOUNTER — Encounter: Payer: Self-pay | Admitting: Oncology
